# Patient Record
Sex: Male | Born: 1945 | Race: White | Hispanic: No | Marital: Married | Smoking: Never smoker
Health system: Southern US, Community
[De-identification: ages and names within clinical notes are randomized; demographics above are authoritative.]

## PROBLEM LIST (undated history)

## (undated) DIAGNOSIS — N183 Chronic kidney disease, stage 3 unspecified: Secondary | ICD-10-CM

## (undated) DIAGNOSIS — K922 Gastrointestinal hemorrhage, unspecified: Secondary | ICD-10-CM

## (undated) DIAGNOSIS — G609 Hereditary and idiopathic neuropathy, unspecified: Secondary | ICD-10-CM

## (undated) DIAGNOSIS — I442 Atrioventricular block, complete: Secondary | ICD-10-CM

## (undated) DIAGNOSIS — E11610 Type 2 diabetes mellitus with diabetic neuropathic arthropathy: Secondary | ICD-10-CM

## (undated) DIAGNOSIS — E7800 Pure hypercholesterolemia, unspecified: Secondary | ICD-10-CM

## (undated) DIAGNOSIS — E11359 Type 2 diabetes mellitus with proliferative diabetic retinopathy without macular edema: Secondary | ICD-10-CM

## (undated) DIAGNOSIS — I10 Essential (primary) hypertension: Secondary | ICD-10-CM

## (undated) DIAGNOSIS — I639 Cerebral infarction, unspecified: Secondary | ICD-10-CM

## (undated) DIAGNOSIS — I2510 Atherosclerotic heart disease of native coronary artery without angina pectoris: Secondary | ICD-10-CM

## (undated) DIAGNOSIS — Z9289 Personal history of other medical treatment: Secondary | ICD-10-CM

## (undated) DIAGNOSIS — K449 Diaphragmatic hernia without obstruction or gangrene: Secondary | ICD-10-CM

## (undated) DIAGNOSIS — E109 Type 1 diabetes mellitus without complications: Secondary | ICD-10-CM

## (undated) DIAGNOSIS — I5022 Chronic systolic (congestive) heart failure: Secondary | ICD-10-CM

## (undated) DIAGNOSIS — Z950 Presence of cardiac pacemaker: Secondary | ICD-10-CM

## (undated) DIAGNOSIS — M5412 Radiculopathy, cervical region: Secondary | ICD-10-CM

## (undated) DIAGNOSIS — H548 Legal blindness, as defined in USA: Secondary | ICD-10-CM

## (undated) DIAGNOSIS — K269 Duodenal ulcer, unspecified as acute or chronic, without hemorrhage or perforation: Secondary | ICD-10-CM

## (undated) DIAGNOSIS — M1990 Unspecified osteoarthritis, unspecified site: Secondary | ICD-10-CM

## (undated) DIAGNOSIS — E669 Obesity, unspecified: Secondary | ICD-10-CM

## (undated) DIAGNOSIS — I739 Peripheral vascular disease, unspecified: Secondary | ICD-10-CM

## (undated) DIAGNOSIS — R55 Syncope and collapse: Secondary | ICD-10-CM

## (undated) DIAGNOSIS — E291 Testicular hypofunction: Secondary | ICD-10-CM

## (undated) DIAGNOSIS — N529 Male erectile dysfunction, unspecified: Secondary | ICD-10-CM

## (undated) DIAGNOSIS — I872 Venous insufficiency (chronic) (peripheral): Secondary | ICD-10-CM

## (undated) DIAGNOSIS — J449 Chronic obstructive pulmonary disease, unspecified: Secondary | ICD-10-CM

## (undated) HISTORY — DX: Radiculopathy, cervical region: M54.12

## (undated) HISTORY — DX: Unspecified osteoarthritis, unspecified site: M19.90

## (undated) HISTORY — DX: Hereditary and idiopathic neuropathy, unspecified: G60.9

## (undated) HISTORY — PX: HEEL SPUR SURGERY: SHX665

## (undated) HISTORY — DX: Atherosclerotic heart disease of native coronary artery without angina pectoris: I25.10

## (undated) HISTORY — DX: Chronic systolic (congestive) heart failure: I50.22

## (undated) HISTORY — DX: Type 1 diabetes mellitus without complications: E10.9

## (undated) HISTORY — PX: FRACTURE SURGERY: SHX138

## (undated) HISTORY — PX: TONSILLECTOMY AND ADENOIDECTOMY: SUR1326

## (undated) HISTORY — DX: Diaphragmatic hernia without obstruction or gangrene: K44.9

## (undated) HISTORY — DX: Type 2 diabetes mellitus with proliferative diabetic retinopathy without macular edema: E11.359

## (undated) HISTORY — DX: Peripheral vascular disease, unspecified: I73.9

## (undated) HISTORY — DX: Essential (primary) hypertension: I10

## (undated) HISTORY — DX: Male erectile dysfunction, unspecified: N52.9

## (undated) HISTORY — PX: CATARACT EXTRACTION W/ INTRAOCULAR LENS  IMPLANT, BILATERAL: SHX1307

## (undated) HISTORY — DX: Pure hypercholesterolemia, unspecified: E78.00

## (undated) HISTORY — DX: Testicular hypofunction: E29.1

## (undated) HISTORY — DX: Duodenal ulcer, unspecified as acute or chronic, without hemorrhage or perforation: K26.9

## (undated) HISTORY — DX: Gastrointestinal hemorrhage, unspecified: K92.2

## (undated) HISTORY — PX: CARPAL TUNNEL RELEASE: SHX101

## (undated) HISTORY — PX: ANKLE FRACTURE SURGERY: SHX122

## (undated) HISTORY — DX: Type 2 diabetes mellitus with diabetic neuropathic arthropathy: E11.610

---

## 1974-08-07 HISTORY — PX: WRIST FUSION: SHX839

## 1996-08-07 HISTORY — PX: CORONARY ARTERY BYPASS GRAFT: SHX141

## 1999-04-08 HISTORY — PX: RETINOPATHY SURGERY: SHX765

## 2000-02-07 ENCOUNTER — Encounter: Admission: RE | Admit: 2000-02-07 | Discharge: 2000-05-07 | Payer: Self-pay | Admitting: Internal Medicine

## 2000-03-14 ENCOUNTER — Encounter: Admission: RE | Admit: 2000-03-14 | Discharge: 2000-06-12 | Payer: Self-pay | Admitting: Internal Medicine

## 2001-03-11 HISTORY — PX: SIGMOIDOSCOPY: SUR1295

## 2004-01-17 ENCOUNTER — Emergency Department (HOSPITAL_COMMUNITY): Admission: EM | Admit: 2004-01-17 | Discharge: 2004-01-17 | Payer: Self-pay

## 2004-01-28 ENCOUNTER — Ambulatory Visit (HOSPITAL_COMMUNITY): Admission: RE | Admit: 2004-01-28 | Discharge: 2004-01-28 | Payer: Self-pay | Admitting: Internal Medicine

## 2004-01-30 HISTORY — PX: OTHER SURGICAL HISTORY: SHX169

## 2004-03-01 ENCOUNTER — Encounter (HOSPITAL_BASED_OUTPATIENT_CLINIC_OR_DEPARTMENT_OTHER): Admission: RE | Admit: 2004-03-01 | Discharge: 2004-05-30 | Payer: Self-pay | Admitting: Internal Medicine

## 2004-06-03 ENCOUNTER — Ambulatory Visit: Payer: Self-pay | Admitting: Internal Medicine

## 2004-06-14 ENCOUNTER — Ambulatory Visit: Payer: Self-pay | Admitting: Endocrinology

## 2004-07-12 ENCOUNTER — Ambulatory Visit: Payer: Self-pay | Admitting: Cardiology

## 2004-07-29 ENCOUNTER — Encounter (HOSPITAL_BASED_OUTPATIENT_CLINIC_OR_DEPARTMENT_OTHER): Admission: RE | Admit: 2004-07-29 | Discharge: 2004-09-21 | Payer: Self-pay | Admitting: Internal Medicine

## 2004-08-02 ENCOUNTER — Ambulatory Visit: Payer: Self-pay

## 2004-08-03 ENCOUNTER — Ambulatory Visit: Payer: Self-pay | Admitting: Endocrinology

## 2004-08-04 ENCOUNTER — Encounter: Admission: RE | Admit: 2004-08-04 | Discharge: 2004-08-04 | Payer: Self-pay | Admitting: Specialist

## 2004-08-09 ENCOUNTER — Encounter: Admission: RE | Admit: 2004-08-09 | Discharge: 2004-11-07 | Payer: Self-pay | Admitting: Endocrinology

## 2004-08-22 ENCOUNTER — Encounter: Admission: RE | Admit: 2004-08-22 | Discharge: 2004-08-22 | Payer: Self-pay | Admitting: Specialist

## 2004-08-31 ENCOUNTER — Ambulatory Visit: Payer: Self-pay | Admitting: Endocrinology

## 2004-10-03 ENCOUNTER — Ambulatory Visit: Payer: Self-pay | Admitting: Endocrinology

## 2004-12-06 ENCOUNTER — Ambulatory Visit: Payer: Self-pay | Admitting: Internal Medicine

## 2004-12-29 ENCOUNTER — Ambulatory Visit: Payer: Self-pay | Admitting: Endocrinology

## 2005-01-26 ENCOUNTER — Ambulatory Visit: Payer: Self-pay | Admitting: Endocrinology

## 2005-02-20 ENCOUNTER — Encounter: Admission: RE | Admit: 2005-02-20 | Discharge: 2005-02-20 | Payer: Self-pay | Admitting: Internal Medicine

## 2005-03-30 ENCOUNTER — Ambulatory Visit: Payer: Self-pay | Admitting: Endocrinology

## 2005-06-06 ENCOUNTER — Ambulatory Visit: Payer: Self-pay | Admitting: Endocrinology

## 2005-06-23 ENCOUNTER — Ambulatory Visit: Payer: Self-pay | Admitting: Endocrinology

## 2005-06-30 ENCOUNTER — Ambulatory Visit: Payer: Self-pay | Admitting: Endocrinology

## 2005-09-28 ENCOUNTER — Ambulatory Visit: Payer: Self-pay | Admitting: Endocrinology

## 2005-10-10 ENCOUNTER — Ambulatory Visit: Payer: Self-pay | Admitting: Endocrinology

## 2005-10-11 ENCOUNTER — Encounter (HOSPITAL_BASED_OUTPATIENT_CLINIC_OR_DEPARTMENT_OTHER): Admission: RE | Admit: 2005-10-11 | Discharge: 2006-01-09 | Payer: Self-pay | Admitting: Surgery

## 2005-10-14 ENCOUNTER — Ambulatory Visit (HOSPITAL_COMMUNITY): Admission: RE | Admit: 2005-10-14 | Discharge: 2005-10-14 | Payer: Self-pay | Admitting: Surgery

## 2005-11-18 ENCOUNTER — Ambulatory Visit (HOSPITAL_COMMUNITY): Admission: RE | Admit: 2005-11-18 | Discharge: 2005-11-18 | Payer: Self-pay | Admitting: Orthopedic Surgery

## 2006-05-16 ENCOUNTER — Ambulatory Visit: Payer: Self-pay | Admitting: Endocrinology

## 2006-05-16 LAB — CONVERTED CEMR LAB
Chol/HDL Ratio, serum: 6.3
Cholesterol: 188 mg/dL (ref 0–200)
HDL: 29.9 mg/dL — ABNORMAL LOW (ref >39.0)
LDL DIRECT: 109.9 mg/dL
Triglyceride fasting, serum: 243 mg/dL (ref 0–149)
VLDL: 49 mg/dL — ABNORMAL HIGH (ref 0–40)

## 2006-05-18 ENCOUNTER — Ambulatory Visit: Payer: Self-pay | Admitting: Endocrinology

## 2006-06-21 ENCOUNTER — Ambulatory Visit: Payer: Self-pay | Admitting: Endocrinology

## 2006-06-21 LAB — CONVERTED CEMR LAB
Chol/HDL Ratio, serum: 4.5
Cholesterol: 156 mg/dL (ref 0–200)
HDL: 34.3 mg/dL — ABNORMAL LOW (ref >39.0)
LDL Cholesterol: 86 mg/dL (ref 0–99)
Triglyceride fasting, serum: 180 mg/dL — ABNORMAL HIGH (ref 0–149)
VLDL: 36 mg/dL (ref 0–40)

## 2006-06-29 ENCOUNTER — Ambulatory Visit: Payer: Self-pay | Admitting: Endocrinology

## 2006-08-07 HISTORY — PX: EYE SURGERY: SHX253

## 2006-08-27 ENCOUNTER — Encounter: Payer: Self-pay | Admitting: Endocrinology

## 2006-11-26 ENCOUNTER — Ambulatory Visit: Payer: Self-pay | Admitting: Endocrinology

## 2006-12-12 ENCOUNTER — Encounter (HOSPITAL_BASED_OUTPATIENT_CLINIC_OR_DEPARTMENT_OTHER): Admission: RE | Admit: 2006-12-12 | Discharge: 2007-02-15 | Payer: Self-pay | Admitting: Surgery

## 2006-12-14 ENCOUNTER — Ambulatory Visit (HOSPITAL_COMMUNITY): Admission: RE | Admit: 2006-12-14 | Discharge: 2006-12-14 | Payer: Self-pay | Admitting: Surgery

## 2007-03-11 ENCOUNTER — Encounter: Payer: Self-pay | Admitting: Endocrinology

## 2007-03-11 DIAGNOSIS — I10 Essential (primary) hypertension: Secondary | ICD-10-CM

## 2007-03-11 DIAGNOSIS — I25810 Atherosclerosis of coronary artery bypass graft(s) without angina pectoris: Secondary | ICD-10-CM | POA: Insufficient documentation

## 2007-05-17 ENCOUNTER — Ambulatory Visit: Payer: Self-pay | Admitting: Endocrinology

## 2007-07-03 ENCOUNTER — Encounter: Payer: Self-pay | Admitting: Internal Medicine

## 2007-07-30 ENCOUNTER — Ambulatory Visit: Payer: Self-pay | Admitting: Endocrinology

## 2007-07-30 ENCOUNTER — Encounter: Payer: Self-pay | Admitting: Endocrinology

## 2007-07-30 DIAGNOSIS — L97909 Non-pressure chronic ulcer of unspecified part of unspecified lower leg with unspecified severity: Secondary | ICD-10-CM | POA: Insufficient documentation

## 2007-08-02 ENCOUNTER — Encounter: Payer: Self-pay | Admitting: Endocrinology

## 2007-09-02 ENCOUNTER — Telehealth: Payer: Self-pay | Admitting: Endocrinology

## 2007-09-15 ENCOUNTER — Emergency Department (HOSPITAL_COMMUNITY): Admission: EM | Admit: 2007-09-15 | Discharge: 2007-09-15 | Payer: Self-pay | Admitting: Emergency Medicine

## 2007-09-16 ENCOUNTER — Telehealth: Payer: Self-pay | Admitting: Endocrinology

## 2007-09-18 ENCOUNTER — Encounter (HOSPITAL_BASED_OUTPATIENT_CLINIC_OR_DEPARTMENT_OTHER): Admission: RE | Admit: 2007-09-18 | Discharge: 2007-10-17 | Payer: Self-pay | Admitting: Surgery

## 2007-09-23 ENCOUNTER — Encounter: Payer: Self-pay | Admitting: Endocrinology

## 2007-10-24 ENCOUNTER — Telehealth: Payer: Self-pay | Admitting: Endocrinology

## 2007-10-24 DIAGNOSIS — M1460 Charcot's joint, unspecified site: Secondary | ICD-10-CM | POA: Insufficient documentation

## 2008-05-21 ENCOUNTER — Ambulatory Visit: Payer: Self-pay | Admitting: Endocrinology

## 2008-05-21 DIAGNOSIS — H548 Legal blindness, as defined in USA: Secondary | ICD-10-CM

## 2008-05-21 LAB — CONVERTED CEMR LAB: Blood Glucose, Fingerstick: 70

## 2008-08-07 HISTORY — PX: ANTERIOR FUSION CERVICAL SPINE: SUR626

## 2008-09-10 ENCOUNTER — Telehealth: Payer: Self-pay | Admitting: Endocrinology

## 2008-09-21 ENCOUNTER — Telehealth (INDEPENDENT_AMBULATORY_CARE_PROVIDER_SITE_OTHER): Payer: Self-pay | Admitting: *Deleted

## 2008-09-23 ENCOUNTER — Ambulatory Visit: Payer: Self-pay | Admitting: Internal Medicine

## 2008-09-23 DIAGNOSIS — I252 Old myocardial infarction: Secondary | ICD-10-CM

## 2008-09-23 DIAGNOSIS — Z8679 Personal history of other diseases of the circulatory system: Secondary | ICD-10-CM | POA: Insufficient documentation

## 2008-09-23 DIAGNOSIS — I5022 Chronic systolic (congestive) heart failure: Secondary | ICD-10-CM

## 2008-09-23 DIAGNOSIS — J309 Allergic rhinitis, unspecified: Secondary | ICD-10-CM | POA: Insufficient documentation

## 2008-09-23 DIAGNOSIS — E291 Testicular hypofunction: Secondary | ICD-10-CM

## 2008-09-23 DIAGNOSIS — G629 Polyneuropathy, unspecified: Secondary | ICD-10-CM

## 2008-09-23 DIAGNOSIS — F329 Major depressive disorder, single episode, unspecified: Secondary | ICD-10-CM

## 2008-09-23 DIAGNOSIS — D509 Iron deficiency anemia, unspecified: Secondary | ICD-10-CM

## 2008-09-23 DIAGNOSIS — M5412 Radiculopathy, cervical region: Secondary | ICD-10-CM | POA: Insufficient documentation

## 2008-09-25 ENCOUNTER — Telehealth (INDEPENDENT_AMBULATORY_CARE_PROVIDER_SITE_OTHER): Payer: Self-pay | Admitting: *Deleted

## 2008-09-28 ENCOUNTER — Ambulatory Visit: Payer: Self-pay | Admitting: Endocrinology

## 2008-09-28 DIAGNOSIS — E7800 Pure hypercholesterolemia, unspecified: Secondary | ICD-10-CM

## 2008-09-28 LAB — CONVERTED CEMR LAB
Direct LDL: 118.5 mg/dL
Hgb A1c MFr Bld: 7.6 % — ABNORMAL HIGH (ref 4.6–6.0)
Triglycerides: 227 mg/dL (ref 0–149)

## 2008-10-12 ENCOUNTER — Encounter (HOSPITAL_BASED_OUTPATIENT_CLINIC_OR_DEPARTMENT_OTHER): Admission: RE | Admit: 2008-10-12 | Discharge: 2009-01-10 | Payer: Self-pay | Admitting: General Surgery

## 2008-10-14 ENCOUNTER — Encounter: Payer: Self-pay | Admitting: Endocrinology

## 2008-10-19 ENCOUNTER — Encounter: Payer: Self-pay | Admitting: Internal Medicine

## 2008-10-19 ENCOUNTER — Emergency Department (HOSPITAL_COMMUNITY): Admission: EM | Admit: 2008-10-19 | Discharge: 2008-10-19 | Payer: Self-pay | Admitting: *Deleted

## 2008-10-19 ENCOUNTER — Encounter: Payer: Self-pay | Admitting: Endocrinology

## 2008-10-20 ENCOUNTER — Encounter: Admission: RE | Admit: 2008-10-20 | Discharge: 2008-10-20 | Payer: Self-pay | Admitting: Neurology

## 2008-10-30 ENCOUNTER — Inpatient Hospital Stay (HOSPITAL_COMMUNITY): Admission: RE | Admit: 2008-10-30 | Discharge: 2008-10-31 | Payer: Self-pay | Admitting: Neurological Surgery

## 2008-11-19 ENCOUNTER — Encounter: Admission: RE | Admit: 2008-11-19 | Discharge: 2008-11-19 | Payer: Self-pay | Admitting: Neurological Surgery

## 2009-01-25 ENCOUNTER — Encounter: Admission: RE | Admit: 2009-01-25 | Discharge: 2009-01-25 | Payer: Self-pay | Admitting: Neurological Surgery

## 2009-02-10 ENCOUNTER — Encounter: Admission: RE | Admit: 2009-02-10 | Discharge: 2009-02-10 | Payer: Self-pay | Admitting: Neurological Surgery

## 2009-02-17 ENCOUNTER — Telehealth: Payer: Self-pay | Admitting: Endocrinology

## 2009-02-26 ENCOUNTER — Telehealth: Payer: Self-pay | Admitting: Endocrinology

## 2009-03-01 ENCOUNTER — Telehealth (INDEPENDENT_AMBULATORY_CARE_PROVIDER_SITE_OTHER): Payer: Self-pay | Admitting: *Deleted

## 2009-03-09 ENCOUNTER — Ambulatory Visit: Payer: Self-pay | Admitting: Endocrinology

## 2009-03-09 DIAGNOSIS — E113599 Type 2 diabetes mellitus with proliferative diabetic retinopathy without macular edema, unspecified eye: Secondary | ICD-10-CM | POA: Insufficient documentation

## 2009-03-09 DIAGNOSIS — E11359 Type 2 diabetes mellitus with proliferative diabetic retinopathy without macular edema: Secondary | ICD-10-CM | POA: Insufficient documentation

## 2009-03-09 DIAGNOSIS — IMO0001 Reserved for inherently not codable concepts without codable children: Secondary | ICD-10-CM

## 2009-03-09 LAB — CONVERTED CEMR LAB: Sed Rate: 45 mm/hr — ABNORMAL HIGH (ref 0–22)

## 2009-04-08 ENCOUNTER — Telehealth: Payer: Self-pay | Admitting: Endocrinology

## 2009-04-13 ENCOUNTER — Encounter: Admission: RE | Admit: 2009-04-13 | Discharge: 2009-04-13 | Payer: Self-pay | Admitting: Neurological Surgery

## 2009-04-26 ENCOUNTER — Encounter (HOSPITAL_BASED_OUTPATIENT_CLINIC_OR_DEPARTMENT_OTHER): Admission: RE | Admit: 2009-04-26 | Discharge: 2009-05-06 | Payer: Self-pay | Admitting: General Surgery

## 2009-04-26 ENCOUNTER — Ambulatory Visit: Payer: Self-pay | Admitting: Endocrinology

## 2009-04-26 DIAGNOSIS — R609 Edema, unspecified: Secondary | ICD-10-CM | POA: Insufficient documentation

## 2009-04-28 LAB — CONVERTED CEMR LAB
BUN: 25 mg/dL — ABNORMAL HIGH (ref 6–23)
Bilirubin Urine: NEGATIVE
Bilirubin, Direct: 0 mg/dL (ref 0.0–0.3)
CO2: 30 meq/L (ref 19–32)
Chloride: 104 meq/L (ref 96–112)
Cholesterol: 146 mg/dL (ref 0–200)
Creatinine, Ser: 1.4 mg/dL (ref 0.4–1.5)
Direct LDL: 87.2 mg/dL
Eosinophils Absolute: 0.3 10*3/uL (ref 0.0–0.7)
Hgb A1c MFr Bld: 7.1 % — ABNORMAL HIGH (ref 4.6–6.5)
Ketones, ur: NEGATIVE mg/dL
Leukocytes, UA: NEGATIVE
MCHC: 33.4 g/dL (ref 30.0–36.0)
MCV: 88.3 fL (ref 78.0–100.0)
Microalb Creat Ratio: 7.3 mg/g (ref 0.0–30.0)
Microalb, Ur: 0.6 mg/dL (ref 0.0–1.9)
Monocytes Absolute: 0.8 10*3/uL (ref 0.1–1.0)
Neutrophils Relative %: 72.6 % (ref 43.0–77.0)
PSA: 0.2 ng/mL (ref 0.10–4.00)
Platelets: 220 10*3/uL (ref 150.0–400.0)
Pro B Natriuretic peptide (BNP): 43 pg/mL (ref 0.0–100.0)
RDW: 13.9 % (ref 11.5–14.6)
Saturation Ratios: 9.8 % — ABNORMAL LOW (ref 20.0–50.0)
Total Bilirubin: 0.6 mg/dL (ref 0.3–1.2)
Urine Glucose: NEGATIVE mg/dL
VLDL: 49.2 mg/dL — ABNORMAL HIGH (ref 0.0–40.0)
pH: 5.5 (ref 5.0–8.0)

## 2009-04-29 ENCOUNTER — Telehealth: Payer: Self-pay | Admitting: Endocrinology

## 2009-05-17 ENCOUNTER — Telehealth: Payer: Self-pay | Admitting: Endocrinology

## 2009-07-13 ENCOUNTER — Encounter: Admission: RE | Admit: 2009-07-13 | Discharge: 2009-07-13 | Payer: Self-pay | Admitting: Neurological Surgery

## 2009-07-27 ENCOUNTER — Encounter (HOSPITAL_BASED_OUTPATIENT_CLINIC_OR_DEPARTMENT_OTHER): Admission: RE | Admit: 2009-07-27 | Discharge: 2009-08-09 | Payer: Self-pay | Admitting: Internal Medicine

## 2009-08-02 ENCOUNTER — Ambulatory Visit: Payer: Self-pay | Admitting: Endocrinology

## 2009-08-11 ENCOUNTER — Encounter (HOSPITAL_BASED_OUTPATIENT_CLINIC_OR_DEPARTMENT_OTHER): Admission: RE | Admit: 2009-08-11 | Discharge: 2009-09-14 | Payer: Self-pay | Admitting: Internal Medicine

## 2009-11-18 ENCOUNTER — Encounter: Payer: Self-pay | Admitting: Internal Medicine

## 2009-11-30 ENCOUNTER — Ambulatory Visit: Payer: Self-pay | Admitting: Endocrinology

## 2009-11-30 LAB — CONVERTED CEMR LAB
Calcium: 9 mg/dL (ref 8.4–10.5)
Chloride: 105 meq/L (ref 96–112)
Creatinine, Ser: 1.6 mg/dL — ABNORMAL HIGH (ref 0.4–1.5)
GFR calc non Af Amer: 46.16 mL/min (ref >60)
Hgb A1c MFr Bld: 7.1 % — ABNORMAL HIGH (ref 4.6–6.5)
Pro B Natriuretic peptide (BNP): 40 pg/mL (ref 0.0–100.0)

## 2009-12-05 ENCOUNTER — Inpatient Hospital Stay (HOSPITAL_COMMUNITY): Admission: EM | Admit: 2009-12-05 | Discharge: 2009-12-09 | Payer: Self-pay | Admitting: Emergency Medicine

## 2009-12-06 ENCOUNTER — Encounter (INDEPENDENT_AMBULATORY_CARE_PROVIDER_SITE_OTHER): Payer: Self-pay | Admitting: Internal Medicine

## 2009-12-07 ENCOUNTER — Ambulatory Visit: Payer: Self-pay | Admitting: Vascular Surgery

## 2009-12-28 ENCOUNTER — Encounter (HOSPITAL_BASED_OUTPATIENT_CLINIC_OR_DEPARTMENT_OTHER): Admission: RE | Admit: 2009-12-28 | Discharge: 2010-02-04 | Payer: Self-pay | Admitting: General Surgery

## 2010-01-13 ENCOUNTER — Encounter: Payer: Self-pay | Admitting: Endocrinology

## 2010-03-09 ENCOUNTER — Telehealth: Payer: Self-pay | Admitting: Endocrinology

## 2010-03-09 ENCOUNTER — Encounter: Payer: Self-pay | Admitting: Endocrinology

## 2010-03-15 ENCOUNTER — Ambulatory Visit: Payer: Self-pay | Admitting: Endocrinology

## 2010-03-15 DIAGNOSIS — N259 Disorder resulting from impaired renal tubular function, unspecified: Secondary | ICD-10-CM | POA: Insufficient documentation

## 2010-03-15 LAB — CONVERTED CEMR LAB
CO2: 29 meq/L (ref 19–32)
Calcium: 9.1 mg/dL (ref 8.4–10.5)
GFR calc non Af Amer: 52.93 mL/min (ref >60)
Hgb A1c MFr Bld: 7.5 % — ABNORMAL HIGH (ref 4.6–6.5)
Pro B Natriuretic peptide (BNP): 59.5 pg/mL (ref 0.0–100.0)
Sodium: 140 meq/L (ref 135–145)

## 2010-05-24 ENCOUNTER — Encounter (HOSPITAL_BASED_OUTPATIENT_CLINIC_OR_DEPARTMENT_OTHER)
Admission: RE | Admit: 2010-05-24 | Discharge: 2010-07-28 | Payer: Self-pay | Source: Home / Self Care | Attending: General Surgery | Admitting: General Surgery

## 2010-05-30 ENCOUNTER — Encounter: Payer: Self-pay | Admitting: Endocrinology

## 2010-07-19 ENCOUNTER — Ambulatory Visit: Payer: Self-pay | Admitting: Endocrinology

## 2010-07-19 DIAGNOSIS — M79609 Pain in unspecified limb: Secondary | ICD-10-CM

## 2010-07-19 LAB — CONVERTED CEMR LAB
AST: 23 units/L (ref 0–37)
Alkaline Phosphatase: 65 units/L (ref 39–117)
Basophils Absolute: 0 10*3/uL (ref 0.0–0.1)
Bilirubin, Direct: 0.1 mg/dL (ref 0.0–0.3)
CO2: 30 meq/L (ref 19–32)
Calcium: 9.4 mg/dL (ref 8.4–10.5)
Cholesterol: 235 mg/dL — ABNORMAL HIGH (ref 0–200)
Creatinine, Ser: 1.7 mg/dL — ABNORMAL HIGH (ref 0.4–1.5)
Creatinine,U: 52.6 mg/dL
Eosinophils Absolute: 0.3 10*3/uL (ref 0.0–0.7)
GFR calc non Af Amer: 43.55 mL/min — ABNORMAL LOW (ref >60.00)
HDL: 40.4 mg/dL (ref >39.00)
Leukocytes, UA: NEGATIVE
Lymphocytes Relative: 20.8 % (ref 12.0–46.0)
MCHC: 34.3 g/dL (ref 30.0–36.0)
Microalb Creat Ratio: 1.1 mg/g (ref 0.0–30.0)
Microalb, Ur: 0.6 mg/dL (ref 0.0–1.9)
Monocytes Relative: 10.4 % (ref 3.0–12.0)
Neutrophils Relative %: 64.6 % (ref 43.0–77.0)
Nitrite: NEGATIVE
PSA: 0.25 ng/mL (ref 0.10–4.00)
RBC: 4.78 M/uL (ref 4.22–5.81)
RDW: 13.9 % (ref 11.5–14.6)
Specific Gravity, Urine: <=1.005 (ref 1.000–1.030)
Total CHOL/HDL Ratio: 6
Total Protein, Urine: NEGATIVE mg/dL
Transferrin: 291 mg/dL (ref 212.0–360.0)
VLDL: 40.8 mg/dL — ABNORMAL HIGH (ref 0.0–40.0)
pH: 6 (ref 5.0–8.0)

## 2010-08-07 DIAGNOSIS — Z9289 Personal history of other medical treatment: Secondary | ICD-10-CM

## 2010-08-07 DIAGNOSIS — K922 Gastrointestinal hemorrhage, unspecified: Secondary | ICD-10-CM

## 2010-08-07 HISTORY — DX: Gastrointestinal hemorrhage, unspecified: K92.2

## 2010-08-07 HISTORY — DX: Personal history of other medical treatment: Z92.89

## 2010-08-09 ENCOUNTER — Encounter: Payer: Self-pay | Admitting: Endocrinology

## 2010-08-12 ENCOUNTER — Ambulatory Visit: Admission: RE | Admit: 2010-08-12 | Discharge: 2010-08-12 | Payer: Self-pay | Source: Home / Self Care

## 2010-08-12 ENCOUNTER — Encounter: Payer: Self-pay | Admitting: Endocrinology

## 2010-08-15 ENCOUNTER — Telehealth: Payer: Self-pay | Admitting: Endocrinology

## 2010-08-16 ENCOUNTER — Ambulatory Visit
Admission: RE | Admit: 2010-08-16 | Discharge: 2010-08-16 | Payer: Self-pay | Source: Home / Self Care | Attending: Endocrinology | Admitting: Endocrinology

## 2010-08-16 DIAGNOSIS — E875 Hyperkalemia: Secondary | ICD-10-CM | POA: Insufficient documentation

## 2010-08-17 DIAGNOSIS — I739 Peripheral vascular disease, unspecified: Secondary | ICD-10-CM | POA: Insufficient documentation

## 2010-08-17 HISTORY — DX: Peripheral vascular disease, unspecified: I73.9

## 2010-08-28 ENCOUNTER — Encounter: Payer: Self-pay | Admitting: Physical Medicine and Rehabilitation

## 2010-08-30 ENCOUNTER — Encounter: Payer: Self-pay | Admitting: Cardiovascular Disease

## 2010-08-30 ENCOUNTER — Ambulatory Visit
Admission: RE | Admit: 2010-08-30 | Discharge: 2010-08-30 | Payer: Self-pay | Source: Home / Self Care | Attending: Cardiovascular Disease | Admitting: Cardiovascular Disease

## 2010-08-30 DIAGNOSIS — R0602 Shortness of breath: Secondary | ICD-10-CM | POA: Insufficient documentation

## 2010-09-08 NOTE — Assessment & Plan Note (Signed)
Summary: rx consult-lb   Vital Signs:  Patient profile:   65 year old male Height:      65 inches (165.10 cm) Weight:      269.25 pounds (122.39 kg) BMI:     44.97 O2 Sat:      97 % on Room air Temp:     97.8 degrees F (36.56 degrees C) oral Pulse rate:   77 / minute Pulse rhythm:   regular BP sitting:   128 / 58  (left arm) Cuff size:   large  Vitals Entered By: Brenton Grills CMA Duncan Dull) (August 16, 2010 3:51 PM)  O2 Flow:  Room air CC: Discuss Benicar/pt is not currently taking Red Yeast Rice or Colestipol/aj Is Patient Diabetic? Yes   CC:  Discuss Benicar/pt is not currently taking Red Yeast Rice or Colestipol/aj.  History of Present Illness: no cbg record, but states cbg's are often over 200 at hs, which is the highest point of the day.   pt states few years of severe pain at the legs and feet, in the context of reclining at night, and assoc numbness.  he says that 60 vicodin last only 20 days.    Current Medications (verified): 1)  Lasix 40 Mg  Tabs (Furosemide) .... Take 2 Po Once Daily 2)  Adult Aspirin Low Strength 81 Mg  Tbdp (Aspirin) .... Take 1 By Mouth Qd 3)  Red Yeast Rice 600 Mg  Caps (Red Yeast Rice Extract) .... Take 1 Two Times A Day Qd 4)  Humulin R 100 Unit/ml Inj Soln (Insulin Regular Human) .... 31 Units Three Times A Day (Just Before Each Meal) 5)  Humulin N 100 Unit/ml  Susp (Insulin Isophane Human) .... 25 Units Qhs 6)  Quad Cane 713.5 7)  Colestipol Hcl 1 Gm Tabs (Colestipol Hcl) .... 5 Qd 8)  Clotrimazole-Betamethasone 1-0.05 % Crea (Clotrimazole-Betamethasone) .... Three Times A Day As Needed Rash 9)  Insulin Syringe 31g X 5/16" 0.3 Ml Misc (Insulin Syringe-Needle U-100) .... Use As Directed 10)  Hydrocodone-Acetaminophen 10-325 Mg Tabs (Hydrocodone-Acetaminophen) .... 1/2-1 Every 4 As Needed For Pain. 11)  Benicar 20 Mg Tabs (Olmesartan Medoxomil) .[name].. 1 Tab Once Daily  Allergies (verified): 1)  * Viagra  Past History:  Past Medical  History: Last updated: 09/23/2008 Coronary artery disease Diabetes mellitus, type I Hypertension DM Retinopathy/Blind Dyslipidemia left foot charcot deformity/chronic pain LE venous insufficiency Anemia-iron deficiency Cerebrovascular accident, hx of/RUE weakness Depression hypogonadism Peripheral neuropathy Allergic rhinitis non compliance E.D. Congestive heart failure Myocardial infarction, hx of  Social History: Reviewed history from 09/23/2008 and no changes required. Married disabled Alcohol use-no Never Smoked comes to appointments with wife  Review of Systems  The patient denies hypoglycemia and dyspnea on exertion.    Physical Exam  General:  obese.  no distress  Extremities:  3+ right pedal edema and 2+ left pedal edema.     Impression & Recommendations:  Problem # 1:  DIABETES MELLITUS, TYPE I (ICD-250.01) needs increased rx  Problem # 2:  LEG PAIN (ICD-729.5) neuropathic needs increased rx  Problem # 3:  HYPERTENSION (ICD-401.9) 20 mg of benicar is not enough.  Problem # 4:  HYPERKALEMIA (ICD-276.7) Assessment: New exac by arb  Medications Added to Medication List This Visit: 1)  Humulin R 100 Unit/ml Inj Soln (Insulin regular human) .... Three times a day (just before each meal) 31-31-35 units 2)  Carvedilol 3.125 Mg Tabs (Carvedilol) .[name].. 1 tab two times a day 3)  Insulin Syringe 31g  X 5/16" 0.5 Ml Misc (Insulin syringe-needle u-100) .... 4x a day 4)  Gabapentin 100 Mg Caps (Gabapentin) .[name].. 1 tab at bedtime  Other Orders: Est. Patient Level V (04540)  Patient Instructions: 1)  reduce benicar to 20 mg.  this is because you have a tendency to retain potassium. 2)  add carvedilol 3.125 mg two times a day.   3)  increase regular insulin to three times a day 31-31-35 units. 4)  increase hydrocodone-apap to a total of 90 pills per month. 5)  add gabapentin 100 mg at bedtime 6)  Please schedule a follow-up appointment in 1  month. Prescriptions: GABAPENTIN 100 MG CAPS (GABAPENTIN) 1 tab at bedtime  #30 x 11   Entered and Authorized by:   Minus Breeding MD   Signed by:   Minus Breeding MD on 08/16/2010   Method used:   Electronically to        [facility] [phone]* (retail)       [address]       [place], [place]  [address]       Ph: [phone]       Fax: [phone]   RxID:   [id number] HYDROCODONE-ACETAMINOPHEN 10-325 MG TABS (HYDROCODONE-ACETAMINOPHEN) 1/2-1 every 4 as needed for pain.  #90 x 5   Entered and Authorized by:   Minus Breeding MD   Signed by:   Minus Breeding MD on 08/16/2010   Method used:   Print then Give to Patient   RxID:   [id number] INSULIN SYRINGE 31G X 5/16" 0.5 ML MISC (INSULIN SYRINGE-NEEDLE U-100) 4x a day  #120 x 11   Entered and Authorized by:   Minus Breeding MD   Signed by:   Minus Breeding MD on 08/16/2010   Method used:   Electronically to        [facility]. [phone]* (retail)       [address]       [place], [place]  [address]       Ph: [phone]       Fax: [phone]   RxID:   [id number] CARVEDILOL 3.125 MG TABS (CARVEDILOL) 1 tab two times a day  #60 x 11   Entered and Authorized by:   Minus Breeding MD   Signed by:   Minus Breeding MD on 08/16/2010   Method used:   Electronically to        [facility]. [phone]* (retail)       [address]       [place], [place]  [address]       Ph: [phone]       Fax: [phone]   RxID:   [id number]    Orders Added: 1)  Est. Patient Level V [78938]

## 2010-09-08 NOTE — Assessment & Plan Note (Signed)
Summary: f/u appt / # / cd   Vital Signs:  Patient profile:   65 year old male Height:      65 inches (165.10 cm) Weight:      258.50 pounds (117.50 kg) BMI:     43.17 O2 Sat:      95 % on Room air Temp:     98.0 degrees F (36.67 degrees C) oral Pulse rate:   72 / minute BP sitting:   118 / 64  (left arm) Cuff size:   regular  Vitals Entered By: Brenton Grills MA (March 15, 2010 3:52 PM)  O2 Flow:  Room air CC: F/U appt/aj Is Patient Diabetic? Yes   CC:  F/U appt/aj.  History of Present Illness: the status of at least 3 ongoing medical problems is addressed today: dm:  no cbg record, but states cbg's are well-controlled in am (100).  it then increases as the day goes on, up to 200.  pt states he feels well in general.   painful neuropathy:  hydrocodone works well.   edema:  no sob  Current Medications (verified): 1)  Benicar 40 Mg  Tabs (Olmesartan Medoxomil) .... Take 1 By Mouth Qd 2)  Lasix 40 Mg  Tabs (Furosemide) .... Take 2 Po Once Daily 3)  Adult Aspirin Low Strength 81 Mg  Tbdp (Aspirin) .... Take 1 By Mouth Qd 4)  Red Yeast Rice 600 Mg  Caps (Red Yeast Rice Extract) .... Take 1 Two Times A Day Qd 5)  Humulin R 100 Unit/ml Inj Soln (Insulin Regular Human) .... Three Times A Day (Qac) 30-25-35 Units 6)  Humulin N 100 Unit/ml  Susp (Insulin Isophane Human) .... 25 Units Qhs 7)  Quad Cane 713.5 8)  Bd Insulin Syringe 31g X 5/16" 0.3 Ml Misc (Insulin Syringe-Needle U-100) .... Use As Directed 9)  Colestipol Hcl 1 Gm Tabs (Colestipol Hcl) .... 5 Qd 10)  Hydrocodone-Acetaminophen 10-325 Mg Tabs (Hydrocodone-Acetaminophen) .[name].. 1 Q4h As Needed Pain 11)  Clotrimazole-Betamethasone 1-0.05 % Crea (Clotrimazole-Betamethasone) .... Three Times A Day As Needed Rash 12)  Insulin Syringe 31g X 5/16" 0.3 Ml Misc (Insulin Syringe-Needle U-100) .... Use As Directed 13)  Hydrocodone-Acetaminophen 10-325 Mg Tabs (Hydrocodone-Acetaminophen) .... 1/2-1 Every 4 Hrn As Needed For  Pain.  Allergies (verified): 1)  * Viagra  Past History:  Past Medical History: Last updated: 09/23/2008 Coronary artery disease Diabetes mellitus, type I Hypertension DM Retinopathy/Blind Dyslipidemia left foot charcot deformity/chronic pain LE venous insufficiency Anemia-iron deficiency Cerebrovascular accident, hx of/RUE weakness Depression hypogonadism Peripheral neuropathy Allergic rhinitis non compliance E.D. Congestive heart failure Myocardial infarction, hx of  Review of Systems       The patient complains of weight gain.  The patient denies syncope.    Physical Exam  General:  obese.  in whelchair.  no distress  Extremities:  2+ right pedal edema and 2+ left pedal edema.   Additional Exam:  B-Type Natriuetic Peptide           59.5 pg/mL                  0.0-100.0 Hemoglobin A1C       [H]  7.5 %     Impression & Recommendations:  Problem # 1:  DIABETES MELLITUS, TYPE I (ICD-250.01) needs increased rx  Problem # 2:  PERIPHERAL NEUROPATHY (ICD-356.9) pain is well-controlled  Problem # 3:  EDEMA (ICD-782.3) normal bnp says it is localized, and does not reprsent generalized fluid overload.  Other  Orders: TLB-BMP (Basic Metabolic Panel-BMET) (80048-METABOL) TLB-BNP (B-Natriuretic Peptide) (83880-BNPR) TLB-A1C / Hgb A1C (Glycohemoglobin) (83036-A1C) Est. Patient Level IV ([address])  Patient Instructions: 1)  blood tests are being ordered for you today.  please call [phone] to hear your test results. 2)  pending the test results, please: 3)  continue regular insulin (just before each meal) 30-25-25 units 4)  same nph (25 units at night) 5)  Please schedule a physical appointment in 3 months. 6)  (update: i left message on phone-tree:  increase regular insulin to (just before each meal) 32-27-27 units).

## 2010-09-08 NOTE — Letter (Signed)
[facility]: [facility]   Imported By: Lester Rampart 03/11/2010 12:37:27  _____________________________________________________________________  External Attachment:    Type:   Image     Comment:   External Document

## 2010-09-08 NOTE — Assessment & Plan Note (Signed)
Summary: F/U APPT/#/CD   Vital Signs:  Patient profile:   65 year old male Height:      65 inches (165.10 cm) Weight:      267.38 pounds (121.54 kg) O2 Sat:      96 % on Room air Temp:     97.4 degrees F (36.33 degrees C) oral Pulse rate:   76 / minute BP sitting:   150 / 80  (left arm) Cuff size:   regular  Vitals Entered By: Josph Macho RMA (November 30, 2009 2:10 PM)  O2 Flow:  Room air CC: Follow-up visit/ Pt is no longer taking lasix, Red Yeast rice, Klor Con, BD insulin syringe is on the list twice, he is not taking Vicodin or Colestipol/ CF Is Patient Diabetic? Yes   CC:  Follow-up visit/ Pt is no longer taking lasix, Red Yeast rice, Klor Con, BD insulin syringe is on the list twice, and he is not taking Vicodin or Colestipol/ CF.  History of Present Illness: pt states few years of moderate pain at the legs, which is worse in the context of sleeping.  he has associated numbness.   no cbg record, but states cbg's are "high" at some times of day.  pt and wife are unsure of any pattern throughout the day.  Current Medications (verified): 1)  Benicar 40 Mg  Tabs (Olmesartan Medoxomil) .... Take 1 By Mouth Qd 2)  Lasix 40 Mg  Tabs (Furosemide) .... Take 2 Po Bid 3)  Adult Aspirin Low Strength 81 Mg  Tbdp (Aspirin) .... Take 1 By Mouth Qd 4)  Red Yeast Rice 600 Mg  Caps (Red Yeast Rice Extract) .... Take 1 Two Times A Day Qd 5)  Klor-Con M20 20 Meq  Tbcr (Potassium Chloride Crys Cr) .... Take 1 By Mouth Once Daily 6)  Humulin R 100 Unit/ml Inj Soln (Insulin Regular Human) .... Three Times A Day (Qac) 30-25-35 Units 7)  Humulin N 100 Unit/ml  Susp (Insulin Isophane Human) .... 25 Units Qhs 8)  Vicodin 5-500 Mg  Tabs (Hydrocodone-Acetaminophen) .[name].. 1 Q4h As Needed Pain 9)  Quad Cane 713.5 10)  Bd Insulin Syringe 31g X 5/16" 0.3 Ml Misc (Insulin Syringe-Needle U-100) .... Use As Directed 11)  Colestipol Hcl 1 Gm Tabs (Colestipol Hcl) .... 5 Qd 12)  Hydrocodone-Acetaminophen  10-325 Mg Tabs (Hydrocodone-Acetaminophen) .[name].. 1 Q4h As Needed Pain 13)  Clotrimazole-Betamethasone 1-0.05 % Crea (Clotrimazole-Betamethasone) .... Three Times A Day As Needed Rash 14)  Insulin Syringe 31g X 5/16" 0.3 Ml Misc (Insulin Syringe-Needle U-100) .... Use As Directed  Allergies (verified): 1)  * Viagra  Past History:  Past Medical History: Last updated: 09/23/2008 Coronary artery disease Diabetes mellitus, type I Hypertension DM Retinopathy/Blind Dyslipidemia left foot charcot deformity/chronic pain LE venous insufficiency Anemia-iron deficiency Cerebrovascular accident, hx of/RUE weakness Depression hypogonadism Peripheral neuropathy Allergic rhinitis non compliance E.D. Congestive heart failure Myocardial infarction, hx of  Past Surgical History: Reviewed history from 03/09/2009 and no changes required. Coronary artery bypass graft Sigmoidoscopy (03/11/2001) Venous doppler (01/28/2004) Carpal tunnel release - left s/p right wrist surgery - bone graft/fusion c-spine surgery 2010 for disc dr Yetta Barre  Review of Systems       The patient complains of weight gain.  The patient denies hypoglycemia and dyspnea on exertion.    Physical Exam  General:  obese.   Extremities:  2+ right pedal edema and 2+ left pedal edema.   Additional Exam:  Potassium  5.0 mEq/L   Creatinine           [H]  1.6 mg/dL                   0.4-5.4   B-Type Natriuetic Peptide        40.0 pg/mL     Impression & Recommendations:  Problem # 1:  DIABETES MELLITUS, TYPE I (ICD-250.01) well-controlled for his situation  Problem # 2:  PERIPHERAL NEUROPATHY (ICD-356.9) with painful component  Problem # 3:  hypokalemia overreplaced  Problem # 4:  EDEMA (ICD-782.3) but he doesn't seem to have generalized fluid overload  Medications Added to Medication List This Visit: 1)  Lasix 40 Mg Tabs (Furosemide) .... Take 2 po once daily 2)  Hydrocodone-acetaminophen 10-325 Mg Tabs  (Hydrocodone-acetaminophen) .... 1/2-1 every 4 hrn as needed for pain.  Other Orders: Diabetic Clinic Referral (Diabetic) TLB-A1C / Hgb A1C (Glycohemoglobin) (83036-A1C) TLB-BMP (Basic Metabolic Panel-BMET) (80048-METABOL) TLB-BNP (B-Natriuretic Peptide) (83880-BNPR) Est. Patient Level IV (09811)  Patient Instructions: 1)  hydrocodone:  1/2 or 1 every 4 hrn as needed for pain. 2)  continue regular insulin (just before each meal) 30-25-25 units 3)  same nph (25 units at night) 4)  refer dietician.  you will be called with a day and time for an appointment 5)  resume the furosemide 2x40 mg two times a day, and potassium.  i have sent prescriptions for you. 6)  Please schedule a follow-up appointment in 3 months. 7)  (update: i left message on phone-tree:  take lasix only 2x40 mg once daily, and do not take klor). Prescriptions: COLESTIPOL HCL 1 GM TABS (COLESTIPOL HCL) 5 qd  #150 x 11   Entered and Authorized by:   Minus Breeding MD   Signed by:   Minus Breeding MD on 11/30/2009   Method used:   Electronically to        [facility]. [phone]* (retail)       [address]       [place], [place]  [address]       Ph: [phone]       Fax: [phone]   RxID:   [id number] KLOR-CON M20 20 MEQ  TBCR (POTASSIUM CHLORIDE CRYS CR) take 1 by mouth once daily  #30 x 11   Entered and Authorized by:   Minus Breeding MD   Signed by:   Minus Breeding MD on 11/30/2009   Method used:   Electronically to        [facility]. [phone]* (retail)       [address]       [place], [place]  [address]       Ph: [phone]       Fax: [phone]   RxID:   [id number] LASIX 40 MG  TABS (FUROSEMIDE) take 2 po bid  #180 Each x 6   Entered and Authorized by:   Minus Breeding MD   Signed by:   Minus Breeding MD on 11/30/2009   Method used:   Electronically to        [facility]. [phone]* (retail)       [address]       [place], [place]  [address]       Ph: [phone]  Fax: [phone]   RxID:   [id number] HYDROCODONE-ACETAMINOPHEN 10-325 MG TABS (HYDROCODONE-ACETAMINOPHEN) 1/2-1 every 4 hrn as needed for pain.  #50 x 3   Entered and Authorized by:   Minus Breeding MD   Signed by:   Minus Breeding MD on 11/30/2009   Method used:   Print then Give to Patient   RxID:   [id number]

## 2010-09-08 NOTE — Progress Notes (Signed)
Summary: Pt ?  Phone Note Call from Patient   Caller: Spouse [phone] Summary of Call: Pt's spouse called that pt was advised to decrease Benicar to 20mg  but has since been experiencing increased HR and "buzzing" in his ears. Spouse states that pt was concerned that thios was doue to decrease in medicine and haas since started to take 2 20mg  tabs. Spouse is requestibng MD advisement and if 40 mg once daily is okay for pt to re-start, spouse is requesting new Rx. Initial call taken by: Margaret Pyle, CMA,  August 15, 2010 11:08 AM  Follow-up for Phone Call        please advise ov Follow-up by: Minus Breeding MD,  August 15, 2010 12:14 PM  Additional Follow-up for Phone Call Additional follow up Details #1::        Spouse advised and transferred to sch appt Additional Follow-up by: Margaret Pyle, CMA,  August 15, 2010 1:46 PM

## 2010-09-08 NOTE — Assessment & Plan Note (Signed)
Summary: FU---D/T---STC   Vital Signs:  Patient profile:   65 year old male Height:      65 inches (165.10 cm) Weight:      261.50 pounds (118.86 kg) BMI:     43.67 O2 Sat:      94 % on Room air Temp:     98.1 degrees F (36.72 degrees C) oral Pulse rate:   73 / minute BP sitting:   122 / 62  (left arm) Cuff size:   regular  Vitals Entered By: Brenton Grills CMA Duncan Dull) (July 19, 2010 4:20 PM)  O2 Flow:  Room air CC: Follow-up visit/aj Is Patient Diabetic? Yes   CC:  Follow-up visit/aj.  History of Present Illness: no cbg record, but states cbg was low only once in the past 3 mos.  he does not recall the time of day.  he says it is well-controlled in general.   pt states 6 mos of worsening of pain at both legs.  he says it can be severe.  there is some assoc numbness. last refill of percocet was 07/12/10, and he says he wants to claim the last remaining refill now, as he is going on vacation.  i have reviewed the [place] controlled substances database with patient, and he has been refilling every 20 days or so.       Current Medications (verified): 1)  Benicar 40 Mg  Tabs (Olmesartan Medoxomil) .... Take 1 By Mouth Qd 2)  Lasix 40 Mg  Tabs (Furosemide) .... Take 2 Po Once Daily 3)  Adult Aspirin Low Strength 81 Mg  Tbdp (Aspirin) .... Take 1 By Mouth Qd 4)  Red Yeast Rice 600 Mg  Caps (Red Yeast Rice Extract) .... Take 1 Two Times A Day Qd 5)  Humulin R 100 Unit/ml Inj Soln (Insulin Regular Human) .... Three Times A Day (Qac) 30-25-35 Units 6)  Humulin N 100 Unit/ml  Susp (Insulin Isophane Human) .... 25 Units Qhs 7)  Quad Cane 713.5 8)  Colestipol Hcl 1 Gm Tabs (Colestipol Hcl) .... 5 Qd 9)  Clotrimazole-Betamethasone 1-0.05 % Crea (Clotrimazole-Betamethasone) .... Three Times A Day As Needed Rash 10)  Insulin Syringe 31g X 5/16" 0.3 Ml Misc (Insulin Syringe-Needle U-100) .... Use As Directed 11)  Hydrocodone-Acetaminophen 10-325 Mg Tabs (Hydrocodone-Acetaminophen) .... 1/2-1  Every 4 Hrn As Needed For Pain.  Allergies (verified): 1)  * Viagra  Past History:  Past Medical History: Last updated: 09/23/2008 Coronary artery disease Diabetes mellitus, type I Hypertension DM Retinopathy/Blind Dyslipidemia left foot charcot deformity/chronic pain LE venous insufficiency Anemia-iron deficiency Cerebrovascular accident, hx of/RUE weakness Depression hypogonadism Peripheral neuropathy Allergic rhinitis non compliance E.D. Congestive heart failure Myocardial infarction, hx of  Review of Systems  The patient denies weight loss and weight gain.    Physical Exam  General:  normal appearance.   Pulses:  dorsalis pedis intact bilat (but decreased from normal, prob due to edema).    Extremities:  there are bilat ankles deformities (charcot joints) there is moderate bilat leg erythema, and healed ulcers, but no warmth.   1+ right pedal edema, 1+ left pedal edema, and mycotic toenails.   ther skin of the feet is very dry. Neurologic:  sensation is intact to touch on the feet, but severely decreased from normal.  Additional Exam:  Hemoglobin A1C       [H]  7.5 %  Cholesterol LDL       158.5 mg/dL   Impression & Recommendations:  Problem # 1:  hyperkalemia mild.  could be exac by benicar.  Problem # 2:  HYPERCHOLESTEROLEMIA (ICD-272.0) therapy is limited by multiple perceived drug intolerances  Problem # 3:  DIABETES MELLITUS, TYPE I (ICD-250.01) needs increased rx  Problem # 4:  PERIPHERAL NEUROPATHY (ICD-356.9) with painful component  Medications Added to Medication List This Visit: 1)  Humulin R 100 Unit/ml Inj Soln (Insulin regular human) .... 31 units three times a day (just before each meal) 2)  Hydrocodone-acetaminophen 10-325 Mg Tabs (Hydrocodone-acetaminophen) .... 1/2-1 every 4 as needed for pain. 3)  Hydrocodone-acetaminophen 10-325 Mg Tabs (Hydrocodone-acetaminophen) .... 1/2-1 every 4 as needed for pain.  please cancel the refill due  in january 4)  Benicar 20 Mg Tabs (Olmesartan medoxomil) .[name].. 1 tab once daily  Other Orders: Flu Vaccine 20yrs + MEDICARE PATIENTS (O1308) Administration Flu vaccine - MCR (G0008) TLB-Lipid Panel (80061-LIPID) TLB-CBC Platelet - w/Differential (85025-CBCD) TLB-Hepatic/Liver Function Pnl (80076-HEPATIC) TLB-TSH (Thyroid Stimulating Hormone) (84443-TSH) TLB-A1C / Hgb A1C (Glycohemoglobin) (83036-A1C) TLB-PSA (Prostate Specific Antigen) (84153-PSA) TLB-Udip w/ Micro (81001-URINE) TLB-Microalbumin/Creat Ratio, Urine (82043-MALB) TLB-BMP (Basic Metabolic Panel-BMET) (80048-METABOL) TLB-IBC Pnl (Iron/FE;Transferrin) (83550-IBC) Vascular Clinic (Vascular) Est. Patient Level IV (65784)  Patient Instructions: 1)  blood tests are being ordered for you today.  please call [phone] to hear your test results. 2)  pending the test results, please: 3)  continue regular insulin (just before each meal) 32-27-27 units 4)  same nph (25 units at night). 5)  Please schedule a physical appointment in 3 months. 6)  check circulation test of the legs.  you will be called with a day and time for an appointment. 7)  your next refill of pain medication will be due soon after the new year.   8)  (update: i left message on phone-tree:  increase reg insulin to 31 units three times a day (just before each meal).  you shoud consider increasing colesstid.  reduce benicar to 20/d) Prescriptions: HYDROCODONE-ACETAMINOPHEN 10-325 MG TABS (HYDROCODONE-ACETAMINOPHEN) 1/2-1 every 4 as needed for pain.  please cancel the refill due in january  #60 x 0   Entered and Authorized by:   Minus Breeding MD   Signed by:   Minus Breeding MD on 07/19/2010   Method used:   Print then Give to Patient   RxID:   [id number]  addendum:  i have cancelled this refill.  Orders Added: 1)  Flu Vaccine 45yrs + MEDICARE PATIENTS [Q2039] 2)  Administration Flu vaccine - MCR [G0008] 3)  TLB-Lipid Panel [80061-LIPID] 4)  TLB-CBC  Platelet - w/Differential [85025-CBCD] 5)  TLB-Hepatic/Liver Function Pnl [80076-HEPATIC] 6)  TLB-TSH (Thyroid Stimulating Hormone) [84443-TSH] 7)  TLB-A1C / Hgb A1C (Glycohemoglobin) [83036-A1C] 8)  TLB-PSA (Prostate Specific Antigen) [84153-PSA] 9)  TLB-Udip w/ Micro [81001-URINE] 10)  TLB-Microalbumin/Creat Ratio, Urine [82043-MALB] 11)  TLB-BMP (Basic Metabolic Panel-BMET) [80048-METABOL] 12)  TLB-IBC Pnl (Iron/FE;Transferrin) [83550-IBC] 13)  Vascular Clinic [Vascular] 14)  Est. Patient Level IV [64403]   Immunizations Administered:  Influenza Vaccine # 1:    Vaccine Type: Fluvax MCR    Site: left deltoid    Mfr: [employer] Pasteur    Dose: 0.5 ml    Route: IM    Given by: Lamar Sprinkles, CMA    Exp. Date: 02/04/2011    Lot #: [id number]    VIS given: 03/01/10 version given July 19, 2010.   Immunizations Administered:  Influenza Vaccine # 1:    Vaccine Type: Fluvax MCR    Site: left deltoid    Mfr: [employer]  Dose: 0.5 ml    Route: IM    Given by: Lamar Sprinkles, CMA    Exp. Date: 02/04/2011    Lot #: [id number]    VIS given: 03/01/10 version given July 19, 2010.

## 2010-09-08 NOTE — Progress Notes (Signed)
Summary: OV due  Phone Note Outgoing Call   Call placed by: Brenton Grills MA,  March 09, 2010 3:54 PM Summary of Call: Per MD, f/u OV is due. Pt informed and stated that he will call in to set up OV.

## 2010-09-08 NOTE — Miscellaneous (Signed)
Summary: Orders Update  Clinical Lists Changes  Orders: Added new Test order of Arterial Duplex Lower Extremity (Arterial Duplex Low) - Signed 

## 2010-09-08 NOTE — Assessment & Plan Note (Signed)
Summary: [facility]   Visit Type:  Initial Consult Primary Provider:  Minus Breeding MD  CC:  Peripherall vascular insuff.  History of Present Illness: 65 year-old male presenting for initial evaluation of lower extremity PAD. He has longstanding diabetes and chronic leg swelling, right greater than left. He has been treated at the wound care center for stasis ulcerations. His main complaint regarding his legs is constant pain throughout both legs, described as an ache. This is not affected by walking. The patient does very little walking. He has chronic left hip pain and is severely limited by this. He is in a motorized scooter today.  ABI's are 0.7 bilaterally and arterial waveforms suggest proximal/inflow disease bilaterally right greater than left. No infraninguinal stenosis was identified.  He also complains of shortness of breath after he elevates his legs for a day. He denies orthopnea or PND.  Current Medications (verified): 1)  Lasix 40 Mg  Tabs (Furosemide) .... Take 2 Po Once Daily 2)  Adult Aspirin Low Strength 81 Mg  Tbdp (Aspirin) .... Take 1 By Mouth Qd 3)  Humulin R 100 Unit/ml Inj Soln (Insulin Regular Human) .... Three Times A Day (Just Before Each Meal) 31-31-35 Units 4)  Humulin N 100 Unit/ml  Susp (Insulin Isophane Human) .... 25 Units Qhs 5)  Clotrimazole-Betamethasone 1-0.05 % Crea (Clotrimazole-Betamethasone) .... Three Times A Day As Needed Rash 6)  Hydrocodone-Acetaminophen 10-325 Mg Tabs (Hydrocodone-Acetaminophen) .... 1/2-1 Every 4 As Needed For Pain. 7)  Carvedilol 3.125 Mg Tabs (Carvedilol) .[name].. 1 Tab Two Times A Day 8)  Insulin Syringe 31g X 5/16" 0.5 Ml Misc (Insulin Syringe-Needle U-100) .... 4x A Day 9)  Gabapentin 100 Mg Caps (Gabapentin) .[name].. 1 Tab At Bedtime  Past History:  Past medical, surgical, family and social histories (including risk factors) reviewed, and no changes noted (except as noted below).  Past Medical History: Reviewed  history from 09/23/2008 and no changes required. Coronary artery disease Diabetes mellitus, type I Hypertension DM Retinopathy/Blind Dyslipidemia left foot charcot deformity/chronic pain LE venous insufficiency Anemia-iron deficiency Cerebrovascular accident, hx of/RUE weakness Depression hypogonadism Peripheral neuropathy Allergic rhinitis non compliance E.D. Congestive heart failure Myocardial infarction, hx of  Past Surgical History: Reviewed history from 03/09/2009 and no changes required. Coronary artery bypass graft Sigmoidoscopy (03/11/2001) Venous doppler (01/28/2004) Carpal tunnel release - left s/p right wrist surgery - bone graft/fusion c-spine surgery 2010 for disc dr Yetta Barre  Family History: Reviewed history from 09/23/2008 and no changes required. no cancer grandmother with DM  Social History: Reviewed history from 08/16/2010 and no changes required. Married disabled Alcohol use-no Never Smoked comes to appointments with wife  Review of Systems       Negative except as per HPI   Vital Signs:  Patient profile:   65 year old male Height:      65 inches Weight:      264.50 pounds BMI:     44.17 Pulse rate:   84 / minute Pulse rhythm:   regular Resp:     18 per minute BP sitting:   156 / 72  (left arm) Cuff size:   large  Vitals Entered By: Vikki Ports (August 30, 2010 3:45 PM)  Serial Vital Signs/Assessments:  Time      Position  BP       Pulse  Resp  Temp     By           R Arm     130/70  Vikki Ports   Physical Exam  General:  Pt is well-developed, alert and oriented, morbidly obese male in no acute distress HEENT: normal Neck: no thyromegaly           JVP normal, carotid upstrokes normal without bruits Lungs: CTA Chest: equal expansion  CV: Apical impulse nondisplaced, RRR without murmur or gallop Abd: soft, NT, positive BS, no HSM, no bruit, obese with large pannus Back: no CVA tenderness Ext: 3+ edema  right leg, 2+ edema left leg few venous stasis ulcers on anterior shin area bilaterally with clean bases and no discharge Skin: bilateral stasis dermatitis     ABI's  Procedure date:  08/12/2010  Findings:      0.72 right, 0.71 left probable aorto-iliac stenosis with abnormal right CFA waveforms and abnormal left SFA waveforms. No significant infrainguinal stenosis  Impression & Recommendations:  Problem # 1:  PERIPHERAL VASCULAR INSUFFICIENCY,  LEGS, BILATERAL (ICD-443.9) This patient has mixed arterial and venous disease of his legs. He also has significant peripheral neuropathy with associated chronic pain. The patient's evaluation is complicated by morbid obesity with large abdominal pannus. I would normally consider a CT angiogram of his aortoiliac vessels but his creatinine has been elevated in the range of 1.7 mg/dL most recently. This is in the setting of longstanding diabetes which increases his risk for contrast nephropathy. I think a catheter-based procedure would be very difficult considering the patient's body habitus. In the absence of critical limb ischemia, I would favor observation for now. Will see the patient back in followup in 6 months for repeat evaluation.  Problem # 2:  DYSPNEA (ICD-786.05) PT at risk for CHF (diabetes, CAD s/p CABG). Will check a 2D echo. BNP has been normal on recent evaluation.  The following medications were removed from the medication list:    Benicar 20 Mg Tabs (Olmesartan medoxomil) .[name]... 1 tab once daily His updated medication list for this problem includes:    Lasix 40 Mg Tabs (Furosemide) .[name]... Take 2 po once daily    Adult Aspirin Low Strength 81 Mg Tbdp (Aspirin) .[name]... Take 1 by mouth qd    Carvedilol 3.125 Mg Tabs (Carvedilol) .[name]... 1 tab two times a day  Orders: Echocardiogram (Echo)  Patient Instructions: 1)  Your physician recommends that you schedule a follow-up appointment in: 6 months 2)  Your physician recommends that  you continue on your current medications as directed. Please refer to the Current Medication list given to you today. 3)  Your physician has requested that you have an echocardiogram.  Echocardiography is a painless test that uses sound waves to create images of your heart. It provides your doctor with information about the size and shape of your heart and how well your heart's chambers and valves are working.  This procedure takes approximately one hour. There are no restrictions for this procedure.

## 2010-09-08 NOTE — Consult Note (Signed)
[facility]   Imported By: Lester Los Arcos 06/06/2010 07:30:41  _____________________________________________________________________  External Attachment:    Type:   Image     Comment:   External Document

## 2010-09-08 NOTE — Letter (Signed)
Summary: [facility] for Diabetic Shoes & inserts/[facility]  [facility] for Diabetic Shoes & inserts/[facility]   Imported By: Sherian Rein 01/14/2010 13:32:48  _____________________________________________________________________  External Attachment:    Type:   Image     Comment:   External Document

## 2010-09-08 NOTE — Letter (Signed)
Summary: [facility] for Glucose Monitor/[facility]  [facility] Glucose Monitor/[facility]   Imported By: Sherian Rein 11/23/2009 08:16:47  _____________________________________________________________________  External Attachment:    Type:   Image     Comment:   External Document

## 2010-09-14 ENCOUNTER — Ambulatory Visit (HOSPITAL_COMMUNITY): Payer: MEDICARE | Attending: Cardiovascular Disease

## 2010-09-14 DIAGNOSIS — I079 Rheumatic tricuspid valve disease, unspecified: Secondary | ICD-10-CM | POA: Insufficient documentation

## 2010-09-14 DIAGNOSIS — E119 Type 2 diabetes mellitus without complications: Secondary | ICD-10-CM | POA: Insufficient documentation

## 2010-09-14 DIAGNOSIS — E785 Hyperlipidemia, unspecified: Secondary | ICD-10-CM | POA: Insufficient documentation

## 2010-09-14 DIAGNOSIS — R0989 Other specified symptoms and signs involving the circulatory and respiratory systems: Secondary | ICD-10-CM | POA: Insufficient documentation

## 2010-09-14 DIAGNOSIS — M7989 Other specified soft tissue disorders: Secondary | ICD-10-CM | POA: Insufficient documentation

## 2010-09-14 DIAGNOSIS — R0602 Shortness of breath: Secondary | ICD-10-CM

## 2010-09-14 DIAGNOSIS — I252 Old myocardial infarction: Secondary | ICD-10-CM | POA: Insufficient documentation

## 2010-09-14 DIAGNOSIS — R0609 Other forms of dyspnea: Secondary | ICD-10-CM | POA: Insufficient documentation

## 2010-09-14 DIAGNOSIS — E669 Obesity, unspecified: Secondary | ICD-10-CM | POA: Insufficient documentation

## 2010-10-25 LAB — GLUCOSE, CAPILLARY
Glucose-Capillary: 105 mg/dL — ABNORMAL HIGH (ref 70–99)
Glucose-Capillary: 152 mg/dL — ABNORMAL HIGH (ref 70–99)
Glucose-Capillary: 162 mg/dL — ABNORMAL HIGH (ref 70–99)
Glucose-Capillary: 181 mg/dL — ABNORMAL HIGH (ref 70–99)
Glucose-Capillary: 183 mg/dL — ABNORMAL HIGH (ref 70–99)
Glucose-Capillary: 73 mg/dL (ref 70–99)
Glucose-Capillary: 83 mg/dL (ref 70–99)
Glucose-Capillary: 90 mg/dL (ref 70–99)
Glucose-Capillary: 92 mg/dL (ref 70–99)

## 2010-10-25 LAB — URINALYSIS, ROUTINE W REFLEX MICROSCOPIC
Hgb urine dipstick: NEGATIVE
Ketones, ur: NEGATIVE mg/dL
Protein, ur: NEGATIVE mg/dL
Urobilinogen, UA: 0.2 mg/dL (ref 0.0–1.0)

## 2010-10-25 LAB — CBC
HCT: 37.2 % — ABNORMAL LOW (ref 39.0–52.0)
Hemoglobin: 11.7 g/dL — ABNORMAL LOW (ref 13.0–17.0)
Hemoglobin: 12.3 g/dL — ABNORMAL LOW (ref 13.0–17.0)
Hemoglobin: 14.5 g/dL (ref 13.0–17.0)
MCHC: 32.9 g/dL (ref 30.0–36.0)
MCHC: 33 g/dL (ref 30.0–36.0)
MCHC: 33.2 g/dL (ref 30.0–36.0)
MCHC: 33.5 g/dL (ref 30.0–36.0)
MCV: 89.3 fL (ref 78.0–100.0)
MCV: 89.7 fL (ref 78.0–100.0)
MCV: 90.3 fL (ref 78.0–100.0)
MCV: 90.7 fL (ref 78.0–100.0)
Platelets: 144 10*3/uL — ABNORMAL LOW (ref 150–400)
Platelets: 151 10*3/uL (ref 150–400)
RBC: 3.88 MIL/uL — ABNORMAL LOW (ref 4.22–5.81)
RBC: 3.98 MIL/uL — ABNORMAL LOW (ref 4.22–5.81)
RBC: 4.14 MIL/uL — ABNORMAL LOW (ref 4.22–5.81)
RBC: 4.83 MIL/uL (ref 4.22–5.81)
RDW: 14.7 % (ref 11.5–15.5)
RDW: 15.1 % (ref 11.5–15.5)
WBC: 7.7 10*3/uL (ref 4.0–10.5)
WBC: 9.4 10*3/uL (ref 4.0–10.5)

## 2010-10-25 LAB — DIFFERENTIAL
Basophils Relative: 0 % (ref 0–1)
Eosinophils Absolute: 0.1 10*3/uL (ref 0.0–0.7)
Monocytes Absolute: 0.9 10*3/uL (ref 0.1–1.0)
Monocytes Relative: 7 % (ref 3–12)
Neutrophils Relative %: 89 % — ABNORMAL HIGH (ref 43–77)

## 2010-10-25 LAB — BASIC METABOLIC PANEL
BUN: 16 mg/dL (ref 6–23)
BUN: 19 mg/dL (ref 6–23)
CO2: 28 mEq/L (ref 19–32)
CO2: 28 mEq/L (ref 19–32)
CO2: 30 mEq/L (ref 19–32)
CO2: 30 mEq/L (ref 19–32)
Calcium: 7.8 mg/dL — ABNORMAL LOW (ref 8.4–10.5)
Calcium: 8 mg/dL — ABNORMAL LOW (ref 8.4–10.5)
Calcium: 8 mg/dL — ABNORMAL LOW (ref 8.4–10.5)
Chloride: 101 mEq/L (ref 96–112)
Chloride: 103 mEq/L (ref 96–112)
Chloride: 97 mEq/L (ref 96–112)
Chloride: 98 mEq/L (ref 96–112)
Creatinine, Ser: 1.31 mg/dL (ref 0.4–1.5)
Creatinine, Ser: 1.34 mg/dL (ref 0.4–1.5)
Creatinine, Ser: 1.36 mg/dL (ref 0.4–1.5)
Creatinine, Ser: 1.54 mg/dL — ABNORMAL HIGH (ref 0.4–1.5)
GFR calc Af Amer: 55 mL/min — ABNORMAL LOW (ref >60)
GFR calc Af Amer: >60 mL/min (ref >60)
GFR calc Af Amer: >60 mL/min (ref >60)
GFR calc Af Amer: >60 mL/min (ref >60)
Glucose, Bld: 101 mg/dL — ABNORMAL HIGH (ref 70–99)
Glucose, Bld: 102 mg/dL — ABNORMAL HIGH (ref 70–99)
Potassium: 4.2 mEq/L (ref 3.5–5.1)
Sodium: 133 mEq/L — ABNORMAL LOW (ref 135–145)
Sodium: 137 mEq/L (ref 135–145)

## 2010-10-25 LAB — URINE CULTURE

## 2010-10-25 LAB — LIPID PANEL
Cholesterol: 147 mg/dL (ref 0–200)
HDL: 45 mg/dL (ref >39)
LDL Cholesterol: 84 mg/dL (ref 0–99)
Total CHOL/HDL Ratio: 3.3 RATIO
Triglycerides: 92 mg/dL (ref <150)

## 2010-10-25 LAB — FUNGAL STAIN: Fungal Smear: NONE SEEN

## 2010-10-25 LAB — CULTURE, BLOOD (ROUTINE X 2)
Culture: NO GROWTH
Culture: NO GROWTH

## 2010-11-07 ENCOUNTER — Other Ambulatory Visit: Payer: Self-pay | Admitting: Endocrinology

## 2010-11-17 LAB — CBC
HCT: 42.2 % (ref 39.0–52.0)
Hemoglobin: 14.1 g/dL (ref 13.0–17.0)
RDW: 14.5 % (ref 11.5–15.5)

## 2010-11-17 LAB — DIFFERENTIAL
Basophils Absolute: 0 10*3/uL (ref 0.0–0.1)
Eosinophils Relative: 4 % (ref 0–5)
Lymphocytes Relative: 21 % (ref 12–46)
Lymphs Abs: 1.5 10*3/uL (ref 0.7–4.0)
Monocytes Absolute: 0.7 10*3/uL (ref 0.1–1.0)

## 2010-11-17 LAB — GLUCOSE, CAPILLARY
Glucose-Capillary: 120 mg/dL — ABNORMAL HIGH (ref 70–99)
Glucose-Capillary: 190 mg/dL — ABNORMAL HIGH (ref 70–99)
Glucose-Capillary: 95 mg/dL (ref 70–99)

## 2010-11-17 LAB — BASIC METABOLIC PANEL
CO2: 25 mEq/L (ref 19–32)
Chloride: 105 mEq/L (ref 96–112)
GFR calc non Af Amer: >60 mL/min (ref >60)
Glucose, Bld: 101 mg/dL — ABNORMAL HIGH (ref 70–99)
Potassium: 4.7 mEq/L (ref 3.5–5.1)
Sodium: 138 mEq/L (ref 135–145)

## 2010-11-20 ENCOUNTER — Emergency Department (HOSPITAL_COMMUNITY)
Admission: EM | Admit: 2010-11-20 | Discharge: 2010-11-21 | Disposition: A | Discharge status: Home / Self Care | Payer: Medicare Other | Attending: Emergency Medicine | Admitting: Emergency Medicine

## 2010-11-20 DIAGNOSIS — E119 Type 2 diabetes mellitus without complications: Secondary | ICD-10-CM | POA: Insufficient documentation

## 2010-11-20 DIAGNOSIS — I25810 Atherosclerosis of coronary artery bypass graft(s) without angina pectoris: Secondary | ICD-10-CM | POA: Insufficient documentation

## 2010-11-20 DIAGNOSIS — K5289 Other specified noninfective gastroenteritis and colitis: Secondary | ICD-10-CM | POA: Insufficient documentation

## 2010-11-20 DIAGNOSIS — Z794 Long term (current) use of insulin: Secondary | ICD-10-CM | POA: Insufficient documentation

## 2010-11-20 DIAGNOSIS — I10 Essential (primary) hypertension: Secondary | ICD-10-CM | POA: Insufficient documentation

## 2010-11-21 ENCOUNTER — Emergency Department (HOSPITAL_COMMUNITY): Payer: Medicare Other

## 2010-11-21 LAB — DIFFERENTIAL
Basophils Absolute: 0 10*3/uL (ref 0.0–0.1)
Basophils Relative: 0 % (ref 0–1)
Eosinophils Absolute: 0 10*3/uL (ref 0.0–0.7)
Eosinophils Relative: 0 % (ref 0–5)
Monocytes Absolute: 1 10*3/uL (ref 0.1–1.0)
Neutro Abs: 6.5 10*3/uL (ref 1.7–7.7)
Neutrophils Relative %: 78 % — ABNORMAL HIGH (ref 43–77)

## 2010-11-21 LAB — CBC
HCT: 46.3 % (ref 39.0–52.0)
MCHC: 33.7 g/dL (ref 30.0–36.0)
Platelets: 194 10*3/uL (ref 150–400)
RDW: 14.4 % (ref 11.5–15.5)
WBC: 8.3 10*3/uL (ref 4.0–10.5)

## 2010-11-21 LAB — URINALYSIS, ROUTINE W REFLEX MICROSCOPIC
Glucose, UA: NEGATIVE mg/dL
Leukocytes, UA: NEGATIVE
Nitrite: NEGATIVE
Protein, ur: 30 mg/dL — AB

## 2010-11-21 LAB — COMPREHENSIVE METABOLIC PANEL
ALT: 32 U/L (ref 0–53)
Albumin: 3.6 g/dL (ref 3.5–5.2)
Calcium: 8.8 mg/dL (ref 8.4–10.5)
GFR calc Af Amer: >60 mL/min (ref >60)
Glucose, Bld: 103 mg/dL — ABNORMAL HIGH (ref 70–99)
Potassium: 4.2 mEq/L (ref 3.5–5.1)
Sodium: 134 mEq/L — ABNORMAL LOW (ref 135–145)
Total Protein: 7.5 g/dL (ref 6.0–8.3)

## 2010-11-21 LAB — LIPASE, BLOOD: Lipase: 36 U/L (ref 11–59)

## 2010-11-21 LAB — URINE MICROSCOPIC-ADD ON

## 2010-11-30 ENCOUNTER — Emergency Department (HOSPITAL_COMMUNITY)
Admission: EM | Admit: 2010-11-30 | Discharge: 2010-11-30 | Disposition: A | Discharge status: Home / Self Care | Payer: Medicare Other | Attending: Emergency Medicine | Admitting: Emergency Medicine

## 2010-11-30 ENCOUNTER — Emergency Department (HOSPITAL_COMMUNITY): Payer: Medicare Other

## 2010-11-30 DIAGNOSIS — M545 Low back pain, unspecified: Secondary | ICD-10-CM | POA: Insufficient documentation

## 2010-11-30 DIAGNOSIS — I4949 Other premature depolarization: Secondary | ICD-10-CM | POA: Insufficient documentation

## 2010-11-30 DIAGNOSIS — S20229A Contusion of unspecified back wall of thorax, initial encounter: Secondary | ICD-10-CM | POA: Insufficient documentation

## 2010-11-30 DIAGNOSIS — R296 Repeated falls: Secondary | ICD-10-CM | POA: Insufficient documentation

## 2010-11-30 DIAGNOSIS — E119 Type 2 diabetes mellitus without complications: Secondary | ICD-10-CM | POA: Insufficient documentation

## 2010-11-30 DIAGNOSIS — Z8673 Personal history of transient ischemic attack (TIA), and cerebral infarction without residual deficits: Secondary | ICD-10-CM | POA: Insufficient documentation

## 2010-11-30 DIAGNOSIS — I10 Essential (primary) hypertension: Secondary | ICD-10-CM | POA: Insufficient documentation

## 2010-11-30 DIAGNOSIS — I25810 Atherosclerosis of coronary artery bypass graft(s) without angina pectoris: Secondary | ICD-10-CM | POA: Insufficient documentation

## 2010-11-30 DIAGNOSIS — Y92009 Unspecified place in unspecified non-institutional (private) residence as the place of occurrence of the external cause: Secondary | ICD-10-CM | POA: Insufficient documentation

## 2010-11-30 LAB — CBC
HCT: 33.5 % — ABNORMAL LOW (ref 39.0–52.0)
Hemoglobin: 10.4 g/dL — ABNORMAL LOW (ref 13.0–17.0)
MCH: 27.1 pg (ref 26.0–34.0)
MCHC: 31 g/dL (ref 30.0–36.0)
MCV: 87.2 fL (ref 78.0–100.0)
RDW: 14.1 % (ref 11.5–15.5)

## 2010-11-30 LAB — URINALYSIS, ROUTINE W REFLEX MICROSCOPIC
Bilirubin Urine: NEGATIVE
Hgb urine dipstick: NEGATIVE
Nitrite: NEGATIVE
Specific Gravity, Urine: 1.024 (ref 1.005–1.030)
Urobilinogen, UA: 0.2 mg/dL (ref 0.0–1.0)

## 2010-11-30 LAB — GLUCOSE, CAPILLARY: Glucose-Capillary: 208 mg/dL — ABNORMAL HIGH (ref 70–99)

## 2010-11-30 LAB — BASIC METABOLIC PANEL
BUN: 54 mg/dL — ABNORMAL HIGH (ref 6–23)
CO2: 25 mEq/L (ref 19–32)
Calcium: 8.6 mg/dL (ref 8.4–10.5)
GFR calc non Af Amer: >60 mL/min (ref >60)
Glucose, Bld: 240 mg/dL — ABNORMAL HIGH (ref 70–99)

## 2010-11-30 LAB — DIFFERENTIAL
Basophils Absolute: 0 10*3/uL (ref 0.0–0.1)
Eosinophils Relative: 0 % (ref 0–5)
Lymphocytes Relative: 14 % (ref 12–46)
Lymphs Abs: 1.6 10*3/uL (ref 0.7–4.0)
Monocytes Absolute: 0.6 10*3/uL (ref 0.1–1.0)
Monocytes Relative: 6 % (ref 3–12)
Neutro Abs: 9.1 10*3/uL — ABNORMAL HIGH (ref 1.7–7.7)

## 2010-12-01 ENCOUNTER — Ambulatory Visit: Payer: MEDICARE | Admitting: Endocrinology

## 2010-12-03 ENCOUNTER — Inpatient Hospital Stay (HOSPITAL_COMMUNITY)
Admission: EM | Admit: 2010-12-03 | Discharge: 2010-12-07 | DRG: 378 | Disposition: A | Discharge status: Home / Self Care | Payer: Medicare Other | Attending: Internal Medicine | Admitting: Internal Medicine

## 2010-12-03 DIAGNOSIS — E785 Hyperlipidemia, unspecified: Secondary | ICD-10-CM | POA: Diagnosis present

## 2010-12-03 DIAGNOSIS — K264 Chronic or unspecified duodenal ulcer with hemorrhage: Principal | ICD-10-CM | POA: Diagnosis present

## 2010-12-03 DIAGNOSIS — R109 Unspecified abdominal pain: Secondary | ICD-10-CM | POA: Diagnosis present

## 2010-12-03 DIAGNOSIS — I2510 Atherosclerotic heart disease of native coronary artery without angina pectoris: Secondary | ICD-10-CM | POA: Diagnosis present

## 2010-12-03 DIAGNOSIS — M1990 Unspecified osteoarthritis, unspecified site: Secondary | ICD-10-CM | POA: Diagnosis present

## 2010-12-03 DIAGNOSIS — M549 Dorsalgia, unspecified: Secondary | ICD-10-CM | POA: Diagnosis present

## 2010-12-03 DIAGNOSIS — L97909 Non-pressure chronic ulcer of unspecified part of unspecified lower leg with unspecified severity: Secondary | ICD-10-CM | POA: Diagnosis present

## 2010-12-03 DIAGNOSIS — Z951 Presence of aortocoronary bypass graft: Secondary | ICD-10-CM

## 2010-12-03 DIAGNOSIS — Z8673 Personal history of transient ischemic attack (TIA), and cerebral infarction without residual deficits: Secondary | ICD-10-CM

## 2010-12-03 DIAGNOSIS — E1139 Type 2 diabetes mellitus with other diabetic ophthalmic complication: Secondary | ICD-10-CM | POA: Diagnosis present

## 2010-12-03 DIAGNOSIS — H543 Unqualified visual loss, both eyes: Secondary | ICD-10-CM | POA: Diagnosis present

## 2010-12-03 DIAGNOSIS — D62 Acute posthemorrhagic anemia: Secondary | ICD-10-CM | POA: Diagnosis present

## 2010-12-03 DIAGNOSIS — I872 Venous insufficiency (chronic) (peripheral): Secondary | ICD-10-CM | POA: Diagnosis present

## 2010-12-03 DIAGNOSIS — I10 Essential (primary) hypertension: Secondary | ICD-10-CM | POA: Diagnosis present

## 2010-12-03 DIAGNOSIS — Z794 Long term (current) use of insulin: Secondary | ICD-10-CM

## 2010-12-04 ENCOUNTER — Other Ambulatory Visit: Payer: Self-pay | Admitting: Gastroenterology

## 2010-12-04 LAB — URINALYSIS, ROUTINE W REFLEX MICROSCOPIC
Bilirubin Urine: NEGATIVE
Glucose, UA: NEGATIVE mg/dL
Hgb urine dipstick: NEGATIVE
Protein, ur: NEGATIVE mg/dL
Specific Gravity, Urine: 1.012 (ref 1.005–1.030)
Urobilinogen, UA: 0.2 mg/dL (ref 0.0–1.0)

## 2010-12-04 LAB — DIFFERENTIAL
Basophils Relative: 0 % (ref 0–1)
Eosinophils Absolute: 0.2 10*3/uL (ref 0.0–0.7)
Neutro Abs: 8.3 10*3/uL — ABNORMAL HIGH (ref 1.7–7.7)
Neutrophils Relative %: 75 % (ref 43–77)

## 2010-12-04 LAB — GLUCOSE, CAPILLARY: Glucose-Capillary: 70 mg/dL (ref 70–99)

## 2010-12-04 LAB — COMPREHENSIVE METABOLIC PANEL
ALT: 21 U/L (ref 0–53)
AST: 18 U/L (ref 0–37)
Albumin: 3.3 g/dL — ABNORMAL LOW (ref 3.5–5.2)
Alkaline Phosphatase: 53 U/L (ref 39–117)
CO2: 29 mEq/L (ref 19–32)
Chloride: 97 mEq/L (ref 96–112)
GFR calc Af Amer: >60 mL/min (ref >60)
Potassium: 3.7 mEq/L (ref 3.5–5.1)
Sodium: 134 mEq/L — ABNORMAL LOW (ref 135–145)
Total Bilirubin: 0.3 mg/dL (ref 0.3–1.2)

## 2010-12-04 LAB — CBC
Hemoglobin: 9.5 g/dL — ABNORMAL LOW (ref 13.0–17.0)
Platelets: 283 10*3/uL (ref 150–400)
RBC: 3.33 MIL/uL — ABNORMAL LOW (ref 4.22–5.81)
WBC: 11 10*3/uL — ABNORMAL HIGH (ref 4.0–10.5)

## 2010-12-04 LAB — HEMOGLOBIN AND HEMATOCRIT, BLOOD
HCT: 26.5 % — ABNORMAL LOW (ref 39.0–52.0)
HCT: 23.8 % — ABNORMAL LOW (ref 39.0–52.0)

## 2010-12-04 LAB — URINE MICROSCOPIC-ADD ON

## 2010-12-04 LAB — PREPARE RBC (CROSSMATCH)

## 2010-12-05 DIAGNOSIS — D62 Acute posthemorrhagic anemia: Secondary | ICD-10-CM

## 2010-12-05 DIAGNOSIS — K253 Acute gastric ulcer without hemorrhage or perforation: Secondary | ICD-10-CM

## 2010-12-05 DIAGNOSIS — D379 Neoplasm of uncertain behavior of digestive organ, unspecified: Secondary | ICD-10-CM

## 2010-12-05 DIAGNOSIS — K921 Melena: Secondary | ICD-10-CM

## 2010-12-05 LAB — GLUCOSE, CAPILLARY: Glucose-Capillary: 110 mg/dL — ABNORMAL HIGH (ref 70–99)

## 2010-12-05 LAB — BASIC METABOLIC PANEL
BUN: 7 mg/dL (ref 6–23)
CO2: 28 mEq/L (ref 19–32)
CO2: 26 mEq/L (ref 19–32)
Calcium: 8.4 mg/dL (ref 8.4–10.5)
Calcium: 8.2 mg/dL — ABNORMAL LOW (ref 8.4–10.5)
Chloride: 104 mEq/L (ref 96–112)
Creatinine, Ser: 0.97 mg/dL (ref 0.4–1.5)
Creatinine, Ser: 1.11 mg/dL (ref 0.4–1.5)
GFR calc Af Amer: >60 mL/min (ref >60)
Glucose, Bld: 102 mg/dL — ABNORMAL HIGH (ref 70–99)
Glucose, Bld: 165 mg/dL — ABNORMAL HIGH (ref 70–99)

## 2010-12-05 LAB — CBC
HCT: 27.3 % — ABNORMAL LOW (ref 39.0–52.0)
Hemoglobin: 8.6 g/dL — ABNORMAL LOW (ref 13.0–17.0)
MCH: 27.9 pg (ref 26.0–34.0)
MCHC: 31.5 g/dL (ref 30.0–36.0)
MCV: 88.6 fL (ref 78.0–100.0)

## 2010-12-06 ENCOUNTER — Ambulatory Visit: Payer: MEDICARE | Admitting: Endocrinology

## 2010-12-06 DIAGNOSIS — D62 Acute posthemorrhagic anemia: Secondary | ICD-10-CM

## 2010-12-06 DIAGNOSIS — D379 Neoplasm of uncertain behavior of digestive organ, unspecified: Secondary | ICD-10-CM

## 2010-12-06 DIAGNOSIS — K253 Acute gastric ulcer without hemorrhage or perforation: Secondary | ICD-10-CM

## 2010-12-06 DIAGNOSIS — K921 Melena: Secondary | ICD-10-CM

## 2010-12-06 LAB — CBC
HCT: 24.8 % — ABNORMAL LOW (ref 39.0–52.0)
HCT: 32.2 % — ABNORMAL LOW (ref 39.0–52.0)
MCH: 28.2 pg (ref 26.0–34.0)
MCV: 88.6 fL (ref 78.0–100.0)
Platelets: 243 10*3/uL (ref 150–400)
RBC: 2.8 MIL/uL — ABNORMAL LOW (ref 4.22–5.81)
RDW: 15.2 % (ref 11.5–15.5)
WBC: 9.4 10*3/uL (ref 4.0–10.5)

## 2010-12-06 LAB — BASIC METABOLIC PANEL
BUN: 4 mg/dL — ABNORMAL LOW (ref 6–23)
Chloride: 107 mEq/L (ref 96–112)
Creatinine, Ser: 1.05 mg/dL (ref 0.4–1.5)
Glucose, Bld: 148 mg/dL — ABNORMAL HIGH (ref 70–99)
Potassium: 3.7 mEq/L (ref 3.5–5.1)

## 2010-12-06 LAB — HEMOGLOBIN AND HEMATOCRIT, BLOOD
HCT: 27.4 % — ABNORMAL LOW (ref 39.0–52.0)
Hemoglobin: 8.7 g/dL — ABNORMAL LOW (ref 13.0–17.0)

## 2010-12-06 LAB — GLUCOSE, CAPILLARY
Glucose-Capillary: 147 mg/dL — ABNORMAL HIGH (ref 70–99)
Glucose-Capillary: 147 mg/dL — ABNORMAL HIGH (ref 70–99)
Glucose-Capillary: 160 mg/dL — ABNORMAL HIGH (ref 70–99)
Glucose-Capillary: 173 mg/dL — ABNORMAL HIGH (ref 70–99)
Glucose-Capillary: 183 mg/dL — ABNORMAL HIGH (ref 70–99)

## 2010-12-07 ENCOUNTER — Other Ambulatory Visit: Payer: Self-pay | Admitting: Internal Medicine

## 2010-12-07 DIAGNOSIS — R933 Abnormal findings on diagnostic imaging of other parts of digestive tract: Secondary | ICD-10-CM

## 2010-12-07 DIAGNOSIS — K269 Duodenal ulcer, unspecified as acute or chronic, without hemorrhage or perforation: Secondary | ICD-10-CM

## 2010-12-07 LAB — BASIC METABOLIC PANEL
GFR calc Af Amer: >60 mL/min (ref >60)
GFR calc non Af Amer: >60 mL/min (ref >60)
Glucose, Bld: 98 mg/dL (ref 70–99)
Potassium: 3.5 mEq/L (ref 3.5–5.1)
Sodium: 137 mEq/L (ref 135–145)

## 2010-12-07 LAB — CBC
HCT: 31.1 % — ABNORMAL LOW (ref 39.0–52.0)
Platelets: 243 10*3/uL (ref 150–400)
RDW: 15.2 % (ref 11.5–15.5)
WBC: 7.4 10*3/uL (ref 4.0–10.5)

## 2010-12-07 LAB — TYPE AND SCREEN: Unit division: 0

## 2010-12-07 LAB — GLUCOSE, CAPILLARY
Glucose-Capillary: 102 mg/dL — ABNORMAL HIGH (ref 70–99)
Glucose-Capillary: 96 mg/dL (ref 70–99)
Glucose-Capillary: 114 mg/dL — ABNORMAL HIGH (ref 70–99)
Glucose-Capillary: 167 mg/dL — ABNORMAL HIGH (ref 70–99)

## 2010-12-07 LAB — URINE CULTURE: Culture  Setup Time: 201204291144

## 2010-12-08 ENCOUNTER — Encounter: Payer: Self-pay | Admitting: Internal Medicine

## 2010-12-14 ENCOUNTER — Telehealth: Payer: Self-pay | Admitting: Internal Medicine

## 2010-12-14 NOTE — Telephone Encounter (Signed)
Spoke with patient's wife and told her it looks by the letter sent to patient on 12/08/10 that the patient would see Dr. Juanda Chance on an as needed basis for further problems.

## 2010-12-14 NOTE — Telephone Encounter (Signed)
To be seen on as needed basis. DB

## 2010-12-15 ENCOUNTER — Encounter: Payer: Self-pay | Admitting: Endocrinology

## 2010-12-15 ENCOUNTER — Ambulatory Visit (INDEPENDENT_AMBULATORY_CARE_PROVIDER_SITE_OTHER): Payer: Medicare Other | Admitting: Endocrinology

## 2010-12-15 ENCOUNTER — Other Ambulatory Visit (INDEPENDENT_AMBULATORY_CARE_PROVIDER_SITE_OTHER): Payer: Medicare Other

## 2010-12-15 DIAGNOSIS — D509 Iron deficiency anemia, unspecified: Secondary | ICD-10-CM

## 2010-12-15 LAB — CBC WITH DIFFERENTIAL/PLATELET
Basophils Absolute: 0.1 10*3/uL (ref 0.0–0.1)
Eosinophils Absolute: 0.3 10*3/uL (ref 0.0–0.7)
Lymphocytes Relative: 21.1 % (ref 12.0–46.0)
Monocytes Relative: 9 % (ref 3.0–12.0)
Platelets: 254 10*3/uL (ref 150.0–400.0)
RDW: 15.4 % — ABNORMAL HIGH (ref 11.5–14.6)

## 2010-12-15 LAB — IBC PANEL
Iron: 76 ug/dL (ref 42–165)
Transferrin: 259.6 mg/dL (ref 212.0–360.0)

## 2010-12-15 NOTE — Discharge Summary (Signed)
Mark French, Mark French               ACCOUNT NO.:  [id number]  MEDICAL RECORD NO.:  [id number]           PATIENT TYPE:  I  LOCATION:  1407                         FACILITY:  [facility]  PHYSICIAN:  Rock Nephew, MD       DATE OF BIRTH:  1945/08/24  DATE OF ADMISSION:  12/03/2010 DATE OF DISCHARGE:  12/07/2010                        DISCHARGE SUMMARY - REFERRING   PRIMARY CARE PHYSICIAN:  [name], MD  DISCHARGE DIAGNOSES: 1. Upper gastrointestinal bleed secondary to duodenal ulcers. 2. Hypertension. 3. Diabetes mellitus. 4. Coronary artery disease. 5. Back pain. 6. 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 7. History of dyslipidemia. 8. History of morbid obesity. 9. Osteoarthritis. 10.Remote history of cerebrovascular accident. 11.History of chronic venous insufficiency with venous stasis ulcers     bilateral lower extremities.  DISCHARGE MEDICATIONS: 1. Colace 100 mg by mouth twice daily. 2. Ferrous sulfate 325 mg p.o. three times a day. 3. Protonix 40 mg p.o. b.i.d. 4. Carafate 1 g p.o. four times a day x14 days. 5. Aspirin 81 mg p.o. daily, enteric-coated. 6. Carvedilol 3.125 mg twice daily. 7. Clotrimazole-betamethasone cream 1 application topically three     times a day as needed for rash. 8. Gabapentin 100 mg 1 tablet by mouth daily at bedtime as needed. 9. Vicodin 1 to 2 tablets by mouth every 4 hours as needed. 10.Humulin N 22 units subcutaneously daily at bedtime. 11.Humulin R 15 to 25 units subcutaneously three times a day. 12.Zofran 8 mg 1 tablet by mouth twice daily.  DISPOSITION:  The patient is discharged home.  The patient has a wheelchair at home.  Lives with his wife.  The patient's diet is carbohydrate-modified.  CONSULTATIONS:  Gastroenterology, Dr. Lina Sar, [facility], and Dr. Charna Elizabeth covering for [place].  PROCEDURES:  The patient initially had an upper endoscopy performed by Dr. Loreta Ave.  EGD with cold biopsies x2:  Initial report was widely  patent esophagus and gastroesophageal junction, nodular lesion at the GE junction not biopsied, small antral ulcers biopsied x2.  Two submucosal lesions in the antrum  duodenal ulcers.  See description above for details.  The patient had a repeat upper endoscopy performed by Dr. Lina Sar on Dec 07, 2010, which showed irregular Z-line in the distal esophagus, multiple ulcers, normal stomach, resolution of the inflammatory mass, there appears to be fold at the GE junction.  Status post biopsies, rule out Barrett's.  Await biopsy results.  Also, H. pylori was not identified with the Warthin-Starry stain.  FOLLOW UP: 1. The patient should follow up with Dr. [name] within 1 week.     The patient should have a CBC done within 1 week. 2. The patient should follow up with Dr. Lina Sar in 2 to 4 weeks.  BRIEF HISTORY OF PRESENT ILLNESS:  Chief complaint was black stools. This is a 65 year old male with a history of multiple medical problems who presented to the emergency department secondary to black stools for the past 4 days along with crampy lower abdominal pain over the past 24 hours.  The patient also was noted to have a hemoglobin of 9.5, previously the hemoglobin level  was 15.6 on April 15.  HOSPITAL COURSE: 1. GI bleed:  The patient was admitted to the hospital.  The patient     had a blood transfusion.  I believe the patient got blood on Dec 06, 2010.  The patient had upper endoscopy x2 which showed duodenal     ulcers and initially there was concern for a mass in the GE     junction, however, it was thought to be inflammatory mass later.     The patient's H pylori was negative on the Warthin-Starry stain.     The patient will be discharged home on a PPI b.i.d. as well as     Carafate. 2. Hypertension:  The patient's blood pressure was controlled during     the admission. 3. Diabetes mellitus:  The patient was placed on a sliding scale     during the  hospitalization. 4. Coronary artery disease:  The patient was continued on a beta-     blocker.  Also, of note, initially the patient's     aspirin enteric-coated 81 mg was withheld; however, it will be     started on discharge. 5. Back pain:  The patient has a history of back pain which was     stable. 6. For DVT prophylaxis, the patient received SCDs.     Rock Nephew, MD     NH/MEDQ  D:  12/07/2010  T:  12/07/2010  Job:  [id number]  cc:   Gregary Signs A. Everardo All, MD [address] [place] [address]  Hedwig Morton. Juanda Chance, MD [address] [place] [address]  Jyothi Elsie Amis, MD, [facility] Fax: [phone]  Electronically Signed by Rock Nephew MD on 12/15/2010 09:49:40 PM

## 2010-12-15 NOTE — Progress Notes (Signed)
Subjective:    Patient ID: Mark French, male    DOB: 11/09/1945, 65 y.o.   MRN: [id number]  HPI Pt says he has lost weight, due to his efforts.  no cbg record, but states he takes regular insulin very seldom.  He takes nph, 25 units qhs.  He sys cbg in am is approx 100.   Edema: pt says improved.  He has not recently taken the lasix. Pt has few days of mild skin loss on the left leg.  No assoc itching. Past Medical History  Diagnosis Date  . CORONARY ARTERY DISEASE 03/11/2007  . DIABETES MELLITUS, TYPE I 03/11/2007  . HYPERTENSION 03/11/2007  . BLINDNESS 05/21/2008  . Proliferative diabetic retinopathy 03/09/2009  . HYPERCHOLESTEROLEMIA 09/28/2008  . CERVICAL RADICULOPATHY, LEFT 09/23/2008  . ANEMIA-IRON DEFICIENCY 09/23/2008  . DEPRESSION 09/23/2008  . PERIPHERAL NEUROPATHY 09/23/2008  . MYOCARDIAL INFARCTION, HX OF 09/23/2008  . CONGESTIVE HEART FAILURE 09/23/2008  . RENAL INSUFFICIENCY 03/15/2010  . CEREBROVASCULAR ACCIDENT, HX OF 09/23/2008  . 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 Venous insufficiency of leg   . ED (erectile dysfunction)   . Charcot foot due to diabetes mellitus     chronic pain  . Hypogonadism male   . Congestive heart failure     Past Surgical History  Procedure Date  . Coronary artery bypass graft   . Carpal tunnel release     left  . Wrist surgery     s/p right-bone graft fusion  . Spine surgery 2010    C spine surgery for disc Dr. Yetta Barre  . Sigmoidoscopy 03/11/2001  . Venous doppler 01/30/2004    History   Social History  . Marital Status: Married    Spouse Name: N/A    Number of Children: N/A  . Years of Education: N/A   Occupational History  . Disabled    Social History Main Topics  . Smoking status: Never Smoker   . Smokeless tobacco: Not on file  . Alcohol Use: No  . Drug Use: No  . Sexually Active:    Other Topics Concern  . Not on file   Social History Narrative   Comes to appointments with wife     Current Outpatient Prescriptions on File Prior to Visit  Medication Sig Dispense Refill  . aspirin 81 MG tablet Take 81 mg by mouth daily.        . carvedilol (COREG) 3.125 MG tablet Take 3.125 mg by mouth 2 (two) times daily with a meal.        . clotrimazole-betamethasone (LOTRISONE) cream APPLY THREE TIMES DAILY AS NEEDED FOR RASH  45 g  5  . furosemide (LASIX) 40 MG tablet Take 2 tablets once daily       . gabapentin (NEURONTIN) 100 MG capsule Take 100 mg by mouth at bedtime.        [name] HYDROcodone-acetaminophen (NORCO) 10-325 MG per tablet 1/2-1 tablet every 4 hours as needed for pain       . insulin regular (HUMULIN R) 100 UNIT/ML injection Inject 5 Units into the skin 3 (three) times daily before meals.       . Insulin Syringe-Needle U-100 (INSULIN SYRINGE .5CC/31GX5/16") 31G X 5/16" 0.5 ML MISC Four times a day         Allergies  Allergen Reactions  . Sildenafil     Family History  Problem Relation Age of Onset  . Heart disease Mother     Coronary Artery Disease  .  Heart disease Father     Coronary Artery Disease  . Heart disease Paternal Grandmother     Coronary Artery Disease  . Cancer Neg Hx   . Diabetes Other     Grandmother    BP 128/68  Pulse 72  Temp(Src) 97.9 F (36.6 C) (Oral)  Ht 5\' 5"  (1.651 m)  Wt 238 lb (107.956 kg)  BMI 39.61 kg/m2  SpO2 95%    Review of Systems Denies sob Neuropathy pain is slightly worse recently.    Objective:   Physical Exam GENERAL: no distress.  Obese. Legs:  2+ bilat edema.  Left leg:  There is a 2x6 cm area of skin avulsion, but no ulcer.       Lab Results  Component Value Date   WBC 7.0 12/15/2010   HGB 10.8* 12/15/2010   HCT 32.2* 12/15/2010   MCV 86.8 12/15/2010   PLT 254.0 12/15/2010     Assessment & Plan:  Weight loss, apparently due to his efforts Dm, with less insulin requirement due to his weight loss. Anemia, improved. Apparent skin avulsion, new

## 2010-12-15 NOTE — Patient Instructions (Addendum)
Stop nph insulin. Take regular insulin 5 units 3x a day (just before each meal).  You should take this no matter what you blood sugar is. check your blood sugar 2 times a day.  vary the time of day when you check, between before the 3 meals, and at bedtime.  also check if you have symptoms of your blood sugar being too high or too low.  please keep a record of the readings and bring it to your next appointment here.  please call us sooner if you are having low blood sugar episodes. Please make a follow-up appointment in 6 weeks Continue your dietary efforts. keep the broken skin on your left leg covered with antibiotic cream, and bandage. (update: i left message on phone-tree:  rx as we discussed)

## 2010-12-16 LAB — PTH, INTACT AND CALCIUM: PTH: 67.1 pg/mL (ref 14.0–72.0)

## 2010-12-17 NOTE — H&P (Signed)
NAME:  Mark French, Mark French               ACCOUNT NO.:  [id number]  MEDICAL RECORD NO.:  [id number]           PATIENT TYPE:  I  LOCATION:  1407                         FACILITY:  [facility]  PHYSICIAN:  Della Goo, M.D. DATE OF BIRTH:  07-25-1946  DATE OF ADMISSION:  12/03/2010 DATE OF DISCHARGE:                             HISTORY & PHYSICAL   PRIMARY CARE PHYSICIAN:  Sean A. Everardo All, MD  CHIEF COMPLAINT:  Black stools.  HISTORY OF PRESENT ILLNESS:  This is a 65 year old male with multiple medical problems who presents to the emergency department secondary to black stools for the past 4 days along with crampy lower abdominal pain over the past 24 hours.  The patient denies having any nausea or vomiting.  He denies that the pain is achy and crampy, rates the pain as being in 8/10 at the worst.  The patient states that he recently had the flu with GI symptoms.  The patient reports having the 40 pounds weight loss over the past 3 weeks secondary to the flu.  The patient was seen and evaluated in the emergency department by the ER staff.  The patient was found to have maroon heme-positive stool on the rectal examination and his laboratory studies revealed a hemoglobin level of 9.5.  Previously, his hemoglobin levels had been 15.6 on April 15th. The patient was referred for medical admission.  PAST MEDICAL HISTORY: 1. Coronary artery disease, status post coronary artery bypass     grafting x5 vessels. 2. Type 2 diabetes mellitus. 3. Dyslipidemia. 4. Hypertension. 5. Morbid obesity. 6. Osteoarthritis. 7. Remote history of CVA at age 83-month-old. 8. The patient also has chronic venous insufficiency with venous     stasis ulcers of bilateral lower extremities.  PAST SURGICAL HISTORY:  Cervical spine surgery; bilateral cataract surgeries; coronary artery bypass grafting x5 vessels; open reduction and internal fixation, left ankle fracture; left foot fasciotomy secondary to plantar  fasciitis; carpal tunnel release of the left wrist; right wrist fusion with bone grafting from the left hip.  MEDICATIONS:  At this time include carvedilol, ondansetron, Humulin N, oxycodone/APAP, and Hydrocodone/APAP.  ALLERGIES:  No known drug allergies.  SOCIAL HISTORY:  The patient is a nonsmoker, nondrinker.  No history of illicit drug usage.  FAMILY HISTORY:  Positive for coronary artery disease in both parents.  REVIEW OF SYSTEMS:  Pertinent as mentioned above in the HPI.  All other organ systems are negative.  PHYSICAL EXAMINATION FINDINGS:  GENERAL:  This is a morbidly obese 23- year-old Caucasian male who is in no visible discomfort or acute distress. VITAL SIGNS:  Temperature 99.0, blood pressure 151/85, heart rate 80, respirations 20, O2 sats 98% to 100%. HEENT:  Normocephalic, atraumatic.  Pupils equally round, reactive to light.  Extraocular movements are intact.  Funduscopic benign.  There is no scleral icterus; however, there is conjunctival pallor.  Nares are patent bilaterally.  Oropharynx is clear. NECK:  Supple.  Full range of motion.  No thyromegaly, adenopathy, jugular venous distention. CARDIOVASCULAR:  Regular rate and rhythm.  No murmurs, gallops, or rubs appreciated. LUNGS:  Clear to auscultation bilaterally.  No rales, rhonchi,  or wheezes. ABDOMEN:  Positive bowel sounds.  Soft, nontender, nondistended.  No hepatosplenomegaly.  Obese abdomen. EXTREMITIES:  Without cyanosis, clubbing, or edema.  There are chronic venous stasis changes to the prepatellar areas of both lower extremities. NEUROLOGIC:  The patient is alert and oriented x3.  Cranial nerves are intact.  There are no focal deficits on examination otherwise.  LABORATORY STUDIES:  White blood cell count 11.0, hemoglobin 9.5, hematocrit 29.4, platelets 283, neutrophils 75%, lymphocytes 15%. Sodium 134, potassium 3.7, chloride 97, carbon dioxide 29, BUN 13, creatinine 1.02, and glucose 53.   Urinalysis reveals small leukocyte esterase.  Urine microscopic with rare epithelials 11-20 white blood cells, few bacteria, 0-2 red blood cells.  ASSESSMENT:  A 65 year old male being admitted with, 1. Rectal bleeding. 2. Anemia secondary to rectal bleeding. 3. Lower abdominal pain. 4. Type 2 diabetes mellitus, on insulin therapy. 5. Hypertension. 6. Coronary artery disease history. 7. Morbid obesity.  PLAN:  The patient will be admitted to an area for monitoring.  Serial hemoglobin and hematocrit will be performed to evaluate for further decrease in the patient's hemoglobin level.  The patient will be placed on IV Protonix therapy and IV fluids have also been ordered for fluid resuscitation.  Clear liquids have been ordered for diet for now, 2 units of packed red blood cells have been placed on hold in the event the patient needs to be transfused.  The patient's regular medications will be further verified.  Sliding-scale insulin coverage has also been ordered for elevated blood sugars and the hypoglycemic protocol will be followed for low blood sugars.  The patient will also be seen by Gastroenterology for evaluation of the rectal bleeding.  The gastroenterologist on call was Dr. Charna Elizabeth. She will see the patient this morning.  The patient will be placed in SCDs for DVT prophylaxis and the patient is a full code at this time.    Della Goo, M.D.    HJ/MEDQ  D:  12/04/2010  T:  12/04/2010  Job:  [id number]  Electronically Signed by Della Goo M.D. on 12/17/2010 07:48:25 PM

## 2010-12-20 NOTE — Assessment & Plan Note (Signed)
[facility]   NAME:  Mark French, Mark French               ACCOUNT NO.:  [id number]   MEDICAL RECORD NO.:  [id number]      DATE OF BIRTH:  November 05, 1945   PHYSICIAN:  Maxwell Caul, M.D. VISIT DATE:  12/14/2006                                   OFFICE VISIT   Mr. Mark French is a gentleman who is treated here in the early part of 2007  for venous stasis ulcers predominantly.  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  He tells me that since July of 2007, he has had a  recurrent ulceration on the lateral aspect of his first metatarsal head.  This has no pain or episodic drainage.  He would basically keep this  clean, dry and covered.  However, he did not specifically stay off his  feet.  When he thought of coming to the wound center, he stated it would  look like it was healing over; therefore, he deferred this, however,  would open up again and he has finally returned for review of this.  Also, 3 weeks ago, he noted a nontraumatic small wound on the right  anterior medial leg and he is here for review of that as well.   PROBLEM LIST:  1. Diabetic on insulin.  2. Hyperlipidemia.  3. Hypertension.   CURRENT MEDICATIONS:  1. Humalog 20 units t.i.d.  2. Levemir 30 daily.  3. Zetia 10 daily.  4. Benicar 40 daily.  5. Lasix 40 daily.  6. Darvocet p.r.n.   PHYSICAL EXAMINATION:  VITAL SIGNS:  His temperature is 98.2.  Pulse 61.  Respirations 20.  Blood pressure 136/67.  His blood glucose was lasted  tested 97 (2 days ago).  RESPIRATORY:  Clear air entry bilaterally.  CARDIAC:  Heart sounds are normal.  There is no S3.  No increase in  jugular venous pressure and specific evidence of congestive heart  failure.  CIRCULATION:  His peripheral pulses are palpable, but difficult to feel.  His ABIs were done in this clinic, measured on the left at 1.08, on the  right at 1.38.  His capillary refill  time was good bilaterally.   WOUND EXAM:  On the lateral aspect of his left first metatarsal head is  the wound measuring 0.5 x 1.5 x 0.1.  This is a dry wound with a clean  base with granulation present, but there is no epithelization at all.  There was no drainage noted.  No evidence of surrounding infection.  No  tenderness.  On the right medial lower leg is a wound measuring 1.1 x  0.4 x 0.1.  This has the appearance of a recurrent venous stasis wound.  Once again, there was no evidence of infection here.  Both of his legs  had significant changes of chronic venous stasis.  Per discussion with  the patient and his wife, it did not appear that anything had changed in  terms of the appearance of his legs.   IMPRESSION:  Wagner grade 2 diabetic foot wound.  He has a Charcot  deformity and diabetic neuropathy in this foot as well.  We have gone  ahead and applied Prisma hydrogel, placed  and doughnut and a healing  sandal on this wound.  He is to have a CBC and differential and an x-ray  of the left foot to exclude osteomyelitis.  He is to return early next  week to be placed in a total contact cast.  On the right leg, we applied  PrameGel and hydrogel to his venous stasis wound and put him in an Unna  wrap.  It is anticipated that this wound will heal with just simple  measures as described above.   FOLLOWUP:  He will be seen on Monday or Tuesday of next week.  At that  point in time, we should have the x-ray and lab work back to determine  whether there is a possibility of underlying osteomyelitis.  If not, I  think he can go ahead and be put in a total contact cast.           ______________________________  Maxwell Caul, M.D.     MGR/MEDQ  D:  12/14/2006  T:  12/14/2006  Job:  [id number]   cc:   Gregary Signs A. Everardo All, MD

## 2010-12-20 NOTE — Assessment & Plan Note (Signed)
[facility]   NAME:  Mark French, Mark French               ACCOUNT NO.:  [id number]   MEDICAL RECORD NO.:  [id number]      DATE OF BIRTH:  March 09, 1946   PHYSICIAN:  Maxwell Caul, M.D.      VISIT DATE:                                   OFFICE VISIT   [name] is a gentleman who was seen previously in this clinic, but  most recently seen last week by Dr. Cheryll Cockayne.  He has significant venous  stasis disease and had bilateral ulcerations, 1 on the left lateral  ankle just below the lateral malleolus and the other on the right calf  laterally.  He was placed an Unna wraps.  He had also had some falls and  had bilateral excoriations over his knees, which he has been applying  Neosporin and a nonstick pads.   In discussing things with the patient and his wife, it would appear that  he is felt to have cervical spinal stenosis and is fairly urgent per  their description appointment tomorrow with Neurosurgery.  They are not  expecting to wait long to know whether he is to have cervical spine  surgery.   On examination, he is afebrile.  The left lateral ankle was debrided in  a non-excisional fashion of some surface eschar.  Underneath this, the  wound does not look unhealthy.  There is epithelialization.  The area on  the right lateral lower extremity is essentially healed over and I am  declaring this resolved.   IMPRESSION:  His wound care issues appear to be satisfactory.  I have  returned him to the Unna wraps bilaterally.  To the area on the left  lateral malleolus, we applied Puracol.  It is not clear that he will be  available for followup in the short-term, as he is going to be  hospitalized for potential surgery.  Other than that I think, we would  need to see him again in 7-10 days.  He has had graded pressure  stockings in the past, although between his wife's left hand disability  and his bilateral upper extremity disability it seems almost impossible  for them  to apply them.           ______________________________  Maxwell Caul, M.D.     MGR/MEDQ  D:  10/22/2008  T:  10/23/2008  Job:  [id number]

## 2010-12-20 NOTE — Op Note (Signed)
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   PROCEDURES:  1. Decompressive anterior cervical diskectomy at C3-4 and C4-5 and      with anterior cervical arthrodesis at C3-4 and C4-5 utilizing 8-mm      corticocancellous allografts.  2. Anterior cervical plating C3-C5 inclusive utilizing the Orthofix      reliant plate.   SURGEON:  Tia Alert, MD   ASSISTANT:  Kathaleen Maser. Pool, MD   ANESTHESIA:  General endotracheal.   COMPLICATIONS:  None apparent.   INDICATIONS FOR PROCEDURE:  Mr. Abruzzo is a 65 year old gentleman who  presents with progressive weakness and numbness in the hands with  instability of gait to the point that he is now using a walker to be  hyperreflexic with positive Hoffmann sign.  MRI showed severe stenosis  at C3-4 and C4-5 with signal change in the spinal cord at C3-4.  I  recommended decompressive anterior cervical diskectomy fusion plating at  C3-4 and C4-5.  He understood the risks, benefits, suspect outcome and  wished to proceed.   DESCRIPTION OF PROCEDURE:  The patient was taken to the operating room.  After induction of adequate generalized endotracheal anesthesia, he was  placed in a supine position on the operating room table.  His right  anterior cervical region was prepped with DuraPrep and then draped in  the usual sterile fashion.  A 5 mL of local anesthesia was injected and  a transverse incision was made to the right of midline  and carried down  to the platysma, which was elevated, opened, and then undermined with  Metzenbaum scissors.  I then dissected a plane medial to the  sternocleidomastoid muscle and internal carotid artery, and lateral to  the trachea and esophagus to expose C3-4 and C4-5.  Intraoperative  fluoroscopy confirmed my level and then I took down the longus colli  muscles and placed the shadow line retractors under this.  The annulus  was incised at both levels and initial diskectomies was done with  pituitary rongeurs and curved curettes.  I then used the high-speed  drill to drill the endplates to prepare for later arthrodesis.  We  drilled to a disk space height of 8 mm drilling in a rectangular  fashion.  We then brought in the operating microscope.  We opened the  posterior longitudinal ligament and removed by undercutting the bodies  of C3-4 and C4-5 at each level.  We angled the scope up and down to  undercut the vertebral bodies.  Bilateral foraminotomies were performed.  Once this was  done, we could see that the dura was no longer pushed away  from [name], but was full and capacious all the way across.  We palpated  over the nerve hook in a circumferential fashion to assure adequate  decompression.  Therefore, by both palpation and visualization as best  we could tell, we had adequate decompression of the central canal at C3-  4 and C4-5.  We then measured interspaces to be 8 mm and tapped 8-mm  corticocancellous allografts into the interspaces at the C3-4, C4-5, and  then used a 44 mm Orthofix reliant plate and placed two 14-mm variable  angle screws in the bodies of C3, C4, C5, and then locked these into  plate with the locking mechanism.  We then irrigated with saline  solution containing bacitracin, dried all bleeding points with a surgery  foam with bipolar cautery, and once meticulous hemostasis was achieved,  I closed the platysma with 3-0 Vicryl, closed the subcuticular tissue   with 3-0 Vicryl, and closed the skin with Benzoin Steri-Strips.  The  drapes were removed.  Sterile dressing was applied.  The patient was  awakened from general anesthesia and transferred to the recovery room in  stable condition.  At the end of the procedure all sponge, needle, and  instrument counts were correct.      Tia Alert, MD  Electronically Signed     DSJ/MEDQ  D:  10/30/2008  T:  10/31/2008  Job:  [id number]

## 2010-12-20 NOTE — Assessment & Plan Note (Signed)
[facility]   NAME:  Mark French, Mark French               ACCOUNT NO.:  [id number]   MEDICAL RECORD NO.:  [id number]      DATE OF BIRTH:  20-Oct-1945   PHYSICIAN:  Theresia Majors. Tanda Rockers, M.D. VISIT DATE:  09/26/2007                                   OFFICE VISIT   SUBJECTIVE:  Mark French is a 65 year old man who we have followed for  bilateral stasis ulcerations.  In the interim he has worn bilateral  Profore's with triamcinolone ointment.  He discontinued his wraps  yesterday because they were too taught.  There has been no interim  fever.   OBJECTIVE:  VITALS:  Blood pressure is 149/65, respirations 18, pulse  rate 61, temperature 97.8.  EXTREMITIES:  Inspection of the lower extremities shows that his edema  is essentially decreased but still present.  The previous blisters and  ulcerations have resolved.  There remains hyperemia with brisk capillary  refill.   ASSESSMENT:  Stasis.   PLAN:  We will return the patient to compression wraps.  We have  instructed him to bring his compressive hose with him during his next  visit.  He is to continue the use of his p.o. diuretics as has been  described by [name].  We anticipate that he will make a transition  to compressive hose during his next visit.      [name], M.D.  Electronically Signed     HAN/MEDQ  D:  09/26/2007  T:  09/27/2007  Job:  [id number]

## 2010-12-20 NOTE — Assessment & Plan Note (Signed)
[facility]   NAME:  Mark French, Mark French               ACCOUNT NO.:  [id number]   MEDICAL RECORD NO.:  [id number]      DATE OF BIRTH:  1945/11/14   PHYSICIAN:  Theresia Majors. Tanda Rockers, M.D. VISIT DATE:  09/19/2007                                   OFFICE VISIT   SUBJECTIVE:  Mark French is a 65 year old man who was last seen in the  wound center in July 2008.  At that time we successfully treated him for  stasis ulcers utilizing an OASIS protocol.  Since then he has been well.  He experienced a recurrence of an ulceration approximately a month ago  and it was managed by his wife with topical ointment as well as Ace  bandages.  Over the last 2 weeks he has had ulcerations involving the  left lower extremity and these have not responded.  There has been no  interim fever.  There have been no changes in his medication.  He  continues to respond well to diuretic therapy.   OBJECTIVE:  Blood pressure is 148/68, respirations 20, pulse rate 84,  temperature 97.9.  Capillary blood glucose is 110 mg%.  Inspection of the lower extremities shows that there are multiple  superficial ulcerations associated with +3 edema.  The wounds were  numbered #7, 8 and 9, measured and cataloged.  Please refer to the data  entries.  Both feet are warm symmetrically but they are not feverish.  Capillary refill is brisk.   ASSESSMENT:  Stasis ulceration with fluid retention.   PLAN:  We placed the patient in bilateral compression wraps with  triamcinolone ointment.  We will reevaluate him in 1 week.  He has been  instructed to keep all of his appointments with his primary care  physician, Dr. Melvyn Novas, and we will see him in 1 week.      [name], M.D.  Electronically Signed     HAN/MEDQ  D:  09/19/2007  T:  09/21/2007  Job:  [id number]

## 2010-12-20 NOTE — Consult Note (Signed)
NAME:  Mark French, Mark French               ACCOUNT NO.:  [id number]   MEDICAL RECORD NO.:  [id number]          PATIENT TYPE:  REC   LOCATION:  FOOT                         FACILITY:  [facility]   PHYSICIAN:  Jonelle Sports. Sevier, M.D. DATE OF BIRTH:  October 12, 1945   DATE OF CONSULTATION:  10/14/2008  DATE OF DISCHARGE:                                 CONSULTATION   HISTORY:  This 65 year old white male is known to this clinic  previously.  He has chronic venous insufficiency in the lower  extremities and has had repeated stasis ulcerations on that basis.  He  does have type 2 diabetes, which is only under fair control but  remarkably has pretty sound arterial circulation in both lower  extremities.   Once we had gotten him healed here, his wife has tended to manage him at  home with sort of a Kerlix/Coban wrap arrangement being unable to  satisfactorily get compression hose on and off him.  He does some time  to time have superficial venous ulcers but nothing that she has felt  compelled to bring him back here for.   Recently, he has begun to have some neurologic difficulties with extreme  weakness in his extremities and there is concern about some spinal cord  compression or other neurologic disorder, and he plans an MRI.   Because of that latter infirmity, he had a fall at home several days ago  and avulsed the nail from his hallux on the left side and also sustained  several abrasion-type wounds on both elbows and the right knee and the  lateral left ankle.   It is for all of these reasons in combination that he is brought here  today for our repeat evaluation and advice.   His past medical history is not recorded in detail in the present chart  but has been in the past and is largely noncontributory except for those  things mentioned above in the present illness.   He has no known medicinal allergies.   REGULAR MEDICATIONS:  1. Benicar 40 mg 1 daily.  2. Aspirin 81 mg daily.  3.  Furosemide 40 mg 2-3 tablets per day.  4. Vicodin 5/500 one p.o. daily.  5. Humulin N and R insulin, taken 30 units once daily in the morning      and/or 30 units 3 times daily at meals.   He is unable now to walk without assistance.  He uses a walker and a  mobility scooter.  He is married and lives at home with his wife, who is  his principal caregiver.  He does not smoke or use alcoholic beverages.   REVIEW OF SYSTEMS:  As stated above, he does have some mild hypertension  and had coronary bypass surgery in 1998.  He has no recognized chronic  pulmonary disease.  There was some question that he had a stroke 2  months ago but that is confused by these other recent symptoms in the  extremities that do not exactly fit a stroke pattern.   PHYSICAL EXAMINATION:  VITAL SIGNS:  Blood pressure is 149/75, pulse is  74, respirations 19, temperature 98.4.  GENERAL:  The patient is obese, alert, cooperative, in no immediate  distress.  He shows some involuntary twitching movements in his lower  extremities.  EXTREMITIES:  He has no gross deformities and no evidence for fractures  and has partial thickness skin avulsion-type abrasions on both forearms,  near the elbows on the extensor surfaces.  He has multiple scattered  superficial ulcerations about both lower extremities and has trophedema  to the knees bilaterally and extreme erythema and warmth involving most  of his anterior tibial areas bilaterally.  He has 3 avulsion-type skin  lesions at the right knee, another on his left lateral foot near the  malleolus, and he indeed has completely avulsed his nail of his left  hallux.  Interestingly, the left hallux appears stable and as if it will  heal without any particular difficulty.   Handheld Doppler studies are done, and he has biphasic pulses at both  locations in the right lower extremity, at the posterior tibial on the  left lower extremity, and a somewhat monophasic signal at the  dorsalis  pedis in the left lower extremity.   IMPRESSION:  Chronic venous insufficiency with bilateral lower extremity  ulcerations on this basis.   Recent traumatic avulsion of the nail of left hallux.   Traumatically induced abrasions of the right knee and both elbows.   DISPOSITION:  Several of the wounds where there is evidence of dried  blood accumulation are selectively debrided of this and this is well  tolerated.   The open areas that will be covered in his lower extremity wraps are  not, otherwise, treated but those that will not, such as on his elbows  and knees, will be treated with applications of Neosporin and covered  with a dry dressing.   Both lower extremities will be placed in Unna wraps, and it is my  anticipation that these lower extremities will quickly clean up with  this and that the erythema that we are seeing in the legs will abate  fairly quickly as well.   The patient will proceed with his other evaluations as per [name]  and the Neurology consultants, and he will be seen here in return visit  in 8 or 9 days.           ______________________________  Jonelle Sports Cheryll Cockayne, M.D.     RES/MEDQ  D:  10/14/2008  T:  10/15/2008  Job:  [id number]   cc:   Gregary Signs A. Everardo All, MD

## 2010-12-23 NOTE — Op Note (Signed)
NAME:  Mark French, Mark French               ACCOUNT NO.:  [id number]   MEDICAL RECORD NO.:  [id number]          PATIENT TYPE:  AMB   LOCATION:  DAY                          FACILITY:  [facility]   PHYSICIAN:  Leonides Grills, M.D.     DATE OF BIRTH:  [birth year]   DATE OF PROCEDURE:  11/18/2005  DATE OF DISCHARGE:                                 OPERATIVE REPORT   PREOPERATIVE DIAGNOSES:  1.  Left plantar cuboid spur.  2.  Left metatarsal plantar spur.  3.  Left gastroc.   POSTOPERATIVE DIAGNOSES:  1.  Left plantar cuboid spur.  2.  Left metatarsal plantar spur.  3.  Left gastroc.   OPERATION:  1.  Partial excision, left cuboid.  2.  Partial excision, left metatarsal.  3.  Stress x-rays of the left foot.  4.  Left gastroc__________.   ANESTHESIA:  General.   SURGEON:  Leonides Grills, MD.   ASSISTANT:  Lianne Cure, PA-C.   ESTIMATED BLOOD LOSS:  Minimal.   TOURNIQUET:  None.   COMPLICATIONS:  None.   DISPOSITION:  Stable to the PR.   INDICATIONS FOR PROCEDURE:  This is a 65 year old gentleman, who has  diabetes with peripheral neuropathy.  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  He was consented to receive the above-procedure,  all risks, which include infection, nerve or vessel injury, persistent  ulceration, and Charcot arthropathy following recovery, bleeding, were all  explained, questions were encouraged and answered.   OPERATIVE NOTE:  The patient was brought to the operating room and placed in  the supine position.  After general endotracheal tube anesthesia was  administered as well as Ancef 1 gm IV piggyback, the left lower extremity  was then prepped and draped in a sterile manner.  No tourniquet was used.  A  longitudinal incision over the medial aspect of the gastrocnemius muscle-  tendinous junction was then made, dissection was carried out through skin,  hemostasis  was obtained.  The fascia was opened in line with the incision.  A conjoined region was then developed between the gastroc soleus, and soft  tissue was then elevated off of the posterior aspect of the gastrocnemius.  With retraction applied, the gastrocnemius then released with a curved Mayo  scissors,__________ outstanding and release of tight gastroc.  The area was  copiously irrigated with normal saline.  The subcu was closed with 3-0  Vicryl, the skin was closed with a 4-0 nylon stitch.  We then made a  longitudinal incision over the plantar aspect of the left cuboid and  metatarsal area, dissection was carried down full thickness directly down to  bone, hemostasis was obtained.  The soft tissue was then elevated off the  plantar aspect of the prominent cuboid as well as base of the fifth  metatarsal.  With a curved half-inch osteotome, this was then chiseled off  and removed with a rongeur.  There was no pulsatile bleeding.  We then  obtained a series of stress x-rays that showed decompression of  the plantar  aspect of his foot adequately.  The area was copiously irrigated with normal  saline.  The subcu was closed with 2-0 Vicryl, the skin was closed with 4-0  nylon, a sterile dressing was applied, a Jones dressing was applied.  The  patient was stable to the PR.      Leonides Grills, M.D.  Electronically Signed     PB/MEDQ  D:  11/18/2005  T:  11/19/2005  Job:  [id number]

## 2010-12-23 NOTE — Assessment & Plan Note (Signed)
[facility]   NAME:  Mark French, Mark French               ACCOUNT NO.:  [id number]   MEDICAL RECORD NO.:  [id number]      DATE OF BIRTH:  Aug 28, 1945   PHYSICIAN:  Theresia Majors. Tanda Rockers, M.D. VISIT DATE:  01/08/2007                                   OFFICE VISIT   SUBJECTIVE:  Mark French is a 66 year old man who we follow for a Wagner  grade 2 diabetic foot ulcer.  In the interim we have treated him with a  healing sandal and a bulky dressing.  He has continued to use antiseptic  soap washes.  The patient reports that he noticed blisters on the  anterior shin of his left lower extremity, one spontaneously ruptured 24  hours ago.  There has been no recognizable thermal trauma, however.   OBJECTIVE:  The patient is accompanied by his wife with blood pressure  130/68, respirations 18, pulse rate 68, temperature is 98.3, capillary  blood glucose is 109 mg%.   EXAMINATION:  Inspection of the left lower extremity shows that there  has been reasonable compression it manifested by lineal wrinkles in the  skin.  There are areas of second degree blisters, which have  spontaneously ruptured on the anterolateral aspect of the foot.  There  is no evidence of acute infection.  There is no cellulitis. There is no  evidence of ischemia.  These 2 areas were copiously irrigated and an  oasis dressing applied per protocol.  These wounds were photographed and  cataloged.  Wound #4 on the left foot shows dramatic improvement with  decrease in the area.  There is 100% granulation and no evidence of  infection.  Wound #5 on the medial lower extremity is completely  resolved.   ASSESSMENT:  Improved Wagner grade 2 diabetic foot ulcer with new second  degree blister formation as described above.   PLAN:  We will continue oasis protocol.  In addition, we will add  Iodosorb gel to the wound #4.  We will reevaluate the patient in 3 days  to assess his response.      [name], M.D.  Electronically Signed     HAN/MEDQ  D:  01/08/2007  T:  01/08/2007  Job:  [id number]
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
## 2010-12-23 NOTE — Assessment & Plan Note (Signed)
[facility]   NAME:  Mark French, Mark French               ACCOUNT NO.:  [id number]   MEDICAL RECORD NO.:  [id number]      DATE OF BIRTH:  17-Feb-1946   PHYSICIAN:  Theresia Majors. Tanda Rockers, M.D. VISIT DATE:  12/25/2006                                   OFFICE VISIT   SUBJECTIVE:  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  In addition he had a wound of the right foot that was similarly managed.  In the interim he has been treated with off-loading sandal, a bulking  dressing and antiseptic soap washes.  He reports that there has been no  excessive drainage, no malodor, and no fever.   OBJECTIVE:  Blood pressures 148/93, respirations 20, pulse rate 68,  temperature 98.1, capillary blood glucose is 132 mg%.   EXAMINATION:  Inspection of the foot shows that the ulceration of wound  #4 is clean, there is no evidence of infection, there is 100%  granulation.  The measurements were taken.  The wound was photographed  and cataloged.  Wound #5 has resolved.  There is no evidence of ischemia  or active infection.   ASSESSMENT:  Clinical improvement.   PLAN:  We will continue the offloading healing sandal with a bulky  dressing as well as the daily antiseptic soap wash.  We will reevaluate  the patient in 2 weeks.      [name], M.D.  Electronically Signed     HAN/MEDQ  D:  12/25/2006  T:  12/25/2006  Job:  [id number]
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
## 2010-12-28 ENCOUNTER — Encounter: Payer: Self-pay | Admitting: Internal Medicine

## 2011-01-29 ENCOUNTER — Other Ambulatory Visit: Payer: Self-pay | Admitting: Endocrinology

## 2011-01-30 NOTE — Telephone Encounter (Signed)
i refilled F/u ov is due

## 2011-01-30 NOTE — Telephone Encounter (Signed)
Rx faxed to [facility]. Pt states his wife will callback to schedule a F/U OV.

## 2011-01-30 NOTE — Telephone Encounter (Signed)
Please advise 

## 2011-02-10 ENCOUNTER — Other Ambulatory Visit (INDEPENDENT_AMBULATORY_CARE_PROVIDER_SITE_OTHER): Payer: Medicare Other

## 2011-02-10 ENCOUNTER — Ambulatory Visit (INDEPENDENT_AMBULATORY_CARE_PROVIDER_SITE_OTHER): Payer: Medicare Other | Admitting: Endocrinology

## 2011-02-10 ENCOUNTER — Encounter: Payer: Self-pay | Admitting: Endocrinology

## 2011-02-10 VITALS — BP 118/72 | HR 68 | Temp 97.7°F | Ht 68.0 in | Wt 230.0 lb

## 2011-02-10 DIAGNOSIS — E109 Type 1 diabetes mellitus without complications: Secondary | ICD-10-CM

## 2011-02-10 DIAGNOSIS — R609 Edema, unspecified: Secondary | ICD-10-CM

## 2011-02-10 DIAGNOSIS — R0989 Other specified symptoms and signs involving the circulatory and respiratory systems: Secondary | ICD-10-CM

## 2011-02-10 LAB — BASIC METABOLIC PANEL
BUN: 16 mg/dL (ref 6–23)
Calcium: 8.9 mg/dL (ref 8.4–10.5)
Creatinine, Ser: 1.1 mg/dL (ref 0.4–1.5)
GFR: 71.63 mL/min (ref >60.00)
Glucose, Bld: 121 mg/dL — ABNORMAL HIGH (ref 70–99)

## 2011-02-10 MED ORDER — GABAPENTIN 300 MG PO CAPS: 300.0000 mg | ORAL_CAPSULE | Freq: Every day | ORAL | Status: DC

## 2011-02-10 MED ORDER — HYDROCODONE-ACETAMINOPHEN 10-325 MG PO TABS: 1.0000 | ORAL_TABLET | ORAL | Status: DC | PRN

## 2011-02-10 NOTE — Patient Instructions (Addendum)
blood tests are being ordered for you today.  please call [phone] to hear your test results.  You will be prompted to enter the 9-digit "MRN" number that appears at the top left of this page, followed by #.  Then you will hear the message. check your blood sugar 2 times a day.  vary the time of day when you check, between before the 3 meals, and at bedtime.  also check if you have symptoms of your blood sugar being too high or too low.  please keep a record of the readings and bring it to your next appointment here.  please call us sooner if you are having low blood sugar episodes. Please make a regular physical appointment in 3 months.   Keep the ulcer areas covered with antibiotic ointment, and large bandaids.  Call if any of these get worse. Increase gabapentin to 300 mg at bedtime (update: i left message on phone-tree:  i advised change back to reg insulin.  If you choose to stay on nph, reduce to 15 units.  You do not need increased diuretic rx.)

## 2011-02-10 NOTE — Progress Notes (Signed)
Subjective:    Patient ID: Mark French, male    DOB: 04-22-1946, 65 y.o.   MRN: [id number]  HPI Pt did not change nph to reg insulin.  he brings a record of his cbg's which i have reviewed today.  It varies from 96-170.  It is in general higher as the day goes on, but not necessarily so.   He has a few mos of slight ulcers on the legs, and assoc edema.   Chronic leg pain persists. Past Medical History  Diagnosis Date  . CORONARY ARTERY DISEASE 03/11/2007  . DIABETES MELLITUS, TYPE I 03/11/2007  . HYPERTENSION 03/11/2007  . BLINDNESS 05/21/2008  . Proliferative diabetic retinopathy 03/09/2009  . HYPERCHOLESTEROLEMIA 09/28/2008  . CERVICAL RADICULOPATHY, LEFT 09/23/2008  . ANEMIA-IRON DEFICIENCY 09/23/2008  . DEPRESSION 09/23/2008  . PERIPHERAL NEUROPATHY 09/23/2008  . MYOCARDIAL INFARCTION, HX OF 09/23/2008  . CONGESTIVE HEART FAILURE 09/23/2008  . RENAL INSUFFICIENCY 03/15/2010  . CEREBROVASCULAR ACCIDENT, HX OF 09/23/2008  . 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 Venous insufficiency of leg   . ED (erectile dysfunction)   . Charcot foot due to diabetes mellitus     chronic pain  . Hypogonadism male   . Congestive heart failure     Past Surgical History  Procedure Date  . Coronary artery bypass graft   . Carpal tunnel release     left  . Wrist surgery     s/p right-bone graft fusion  . Spine surgery 2010    C spine surgery for disc Dr. Yetta Barre  . Sigmoidoscopy 03/11/2001  . Venous doppler 01/30/2004    History   Social History  . Marital Status: Married    Spouse Name: N/A    Number of Children: N/A  . Years of Education: N/A   Occupational History  . Disabled    Social History Main Topics  . Smoking status: Never Smoker   . Smokeless tobacco: Not on file  . Alcohol Use: No  . Drug Use: No  . Sexually Active:    Other Topics Concern  . Not on file   Social History Narrative   Comes to appointments with wife    Current Outpatient  Prescriptions on File Prior to Visit  Medication Sig Dispense Refill  . aspirin 81 MG tablet Take 81 mg by mouth daily.        . carvedilol (COREG) 3.125 MG tablet Take 3.125 mg by mouth 2 (two) times daily with a meal.        . clotrimazole-betamethasone (LOTRISONE) cream APPLY THREE TIMES DAILY AS NEEDED FOR RASH  45 g  5  . furosemide (LASIX) 40 MG tablet Take 2 tablets once daily       . gabapentin (NEURONTIN) 100 MG capsule Take 100 mg by mouth at bedtime.        [name] HYDROcodone-acetaminophen (NORCO) 10-325 MG per tablet TAKE 1/2 TO 1 TABLET BY MOUTH EVERY 4 HOURS AS NEEDED FOR PAIN  90 tablet  0  . insulin regular (HUMULIN R) 100 UNIT/ML injection Inject 5 Units into the skin 3 (three) times daily before meals.       . Insulin Syringe-Needle U-100 (INSULIN SYRINGE .5CC/31GX5/16") 31G X 5/16" 0.5 ML MISC Four times a day       . Docusate Sodium (STOOL SOFTENER) 100 MG capsule Take 100 mg by mouth daily.        [name] DISCONTD: Ferrous Sulfate (IRON) 325 (65 FE) MG TABS Take 1 tablet  by mouth 3 (three) times daily.        [name] DISCONTD: ondansetron (ZOFRAN) 8 MG tablet Take by mouth 2 (two) times daily as needed.        [name] DISCONTD: pantoprazole (PROTONIX) 40 MG tablet Take 40 mg by mouth 2 (two) times daily.          Allergies  Allergen Reactions  . Sildenafil     Family History  Problem Relation Age of Onset  . Heart disease Mother     Coronary Artery Disease  . Heart disease Father     Coronary Artery Disease  . Heart disease Paternal Grandmother     Coronary Artery Disease  . Cancer Neg Hx   . Diabetes Other     Grandmother    BP 118/72  Pulse 68  Temp(Src) 97.7 F (36.5 C) (Oral)  Ht 5\' 8"  (1.727 m)  Wt 230 lb (104.327 kg)  BMI 34.97 kg/m2  SpO2 96%    Review of Systems  Constitutional: Negative for unexpected weight change.  Respiratory: Negative for shortness of breath.   Cardiovascular: Negative for chest pain.  Gastrointestinal: Negative for vomiting.    Genitourinary: Negative for frequency.  Skin:       Denies excessive diaphoresis  Neurological: Negative for syncope.  Psychiatric/Behavioral: Negative for dysphoric mood.   denies hypoglycemia and fever.     Objective:   Physical Exam Pulses: dorsalis pedis intact bilat.   Feet: no deformity.  no ulcer on the feet.  feet are of normal color and temp.  1+ bilat leg edema.  There are several shallow ulcers on both ant tibial areas.  There is bilteral onychomycosis.  There is an abrasion on the right great toe.  Neuro: sensation is intact to touch on the feet, but decreased from normal.    Lab Results  Component Value Date   HGBA1C 6.7* 02/10/2011  bnp=normal Assessment & Plan:  DM: overcontrolled, given this regimen, which does match insulin to his changing needs throughout the day. Leg ulcers, worse. Neuropathic pain, needs increased rx Edema.  In view of normal bnp, this does not represent overall fluid overload.

## 2011-02-13 ENCOUNTER — Encounter: Payer: Self-pay | Admitting: Internal Medicine

## 2011-02-14 ENCOUNTER — Other Ambulatory Visit: Payer: Self-pay | Admitting: Endocrinology

## 2011-02-16 ENCOUNTER — Other Ambulatory Visit (INDEPENDENT_AMBULATORY_CARE_PROVIDER_SITE_OTHER): Payer: Medicare Other

## 2011-02-16 ENCOUNTER — Encounter: Payer: Self-pay | Admitting: Internal Medicine

## 2011-02-16 ENCOUNTER — Ambulatory Visit (INDEPENDENT_AMBULATORY_CARE_PROVIDER_SITE_OTHER): Payer: Medicare Other | Admitting: Internal Medicine

## 2011-02-16 DIAGNOSIS — K260 Acute duodenal ulcer with hemorrhage: Secondary | ICD-10-CM

## 2011-02-16 DIAGNOSIS — D509 Iron deficiency anemia, unspecified: Secondary | ICD-10-CM

## 2011-02-16 DIAGNOSIS — K269 Duodenal ulcer, unspecified as acute or chronic, without hemorrhage or perforation: Secondary | ICD-10-CM

## 2011-02-16 LAB — CBC WITH DIFFERENTIAL/PLATELET
Basophils Absolute: 0 10*3/uL (ref 0.0–0.1)
Basophils Relative: 0.5 % (ref 0.0–3.0)
Eosinophils Absolute: 0.2 10*3/uL (ref 0.0–0.7)
Eosinophils Relative: 3 % (ref 0.0–5.0)
HCT: 39 % (ref 39.0–52.0)
Hemoglobin: 13 g/dL (ref 13.0–17.0)
Lymphocytes Relative: 21.2 % (ref 12.0–46.0)
Lymphs Abs: 1.8 10*3/uL (ref 0.7–4.0)
MCHC: 33.3 g/dL (ref 30.0–36.0)
MCV: 82.3 fl (ref 78.0–100.0)
Monocytes Absolute: 0.6 10*3/uL (ref 0.1–1.0)
Monocytes Relative: 7.5 % (ref 3.0–12.0)
Neutro Abs: 5.7 10*3/uL (ref 1.4–7.7)
Neutrophils Relative %: 67.8 % (ref 43.0–77.0)
Platelets: 246 10*3/uL (ref 150.0–400.0)
RBC: 4.73 Mil/uL (ref 4.22–5.81)
RDW: 15.2 % — ABNORMAL HIGH (ref 11.5–14.6)
WBC: 8.4 10*3/uL (ref 4.5–10.5)

## 2011-02-16 LAB — IBC PANEL: Iron: 30 ug/dL — ABNORMAL LOW (ref 42–165)

## 2011-02-16 MED ORDER — OMEPRAZOLE 20 MG PO CPDR: 20.0000 mg | DELAYED_RELEASE_CAPSULE | Freq: Every day | ORAL | Status: DC

## 2011-02-16 NOTE — Progress Notes (Signed)
ACELIN FERDIG 06/19/1946 MRN [id number]      History of Present Illness:  This is a 65 year old white male who is status post upper GI bleed from a duodenal ulcer in May 2012 requiring hospitalization. An upper endoscopy confirmed the presence of a duodenal ulcer although the etiology is not clear. He was H. pylori negative. He is taking aspirin 81 mg a day, no NSAIDs. He discontinued his proton pump inhibitor 4 weeks ago. Patient denies abdominal pain, black stools, nausea or vomiting . Additional medical problems are coronary artery disease, diabetes mellitus, high blood pressure, blindness, congestive heart failure and history of CVA.   Past Medical History  Diagnosis Date  . CORONARY ARTERY DISEASE 03/11/2007  . DIABETES MELLITUS, TYPE I 03/11/2007  . HYPERTENSION 03/11/2007  . BLINDNESS 05/21/2008  . Proliferative diabetic retinopathy 03/09/2009  . HYPERCHOLESTEROLEMIA 09/28/2008  . CERVICAL RADICULOPATHY, LEFT 09/23/2008  . ANEMIA-IRON DEFICIENCY 09/23/2008  . DEPRESSION 09/23/2008  . PERIPHERAL NEUROPATHY 09/23/2008  . MYOCARDIAL INFARCTION, HX OF 09/23/2008  . CONGESTIVE HEART FAILURE 09/23/2008  . RENAL INSUFFICIENCY 03/15/2010  . CEREBROVASCULAR ACCIDENT, HX OF 09/23/2008  . 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 Venous insufficiency of leg   . ED (erectile dysfunction)   . Charcot foot due to diabetes mellitus     chronic pain  . Hypogonadism male   . Congestive heart failure   . Duodenal ulcer 2012    multiple  . GI bleed   . Osteoarthritis    Past Surgical History  Procedure Date  . Coronary artery bypass graft   . Carpal tunnel release     left  . Wrist surgery     s/p right-bone graft fusion  . Spine surgery 2010    C spine surgery for disc Dr. Yetta Barre  . Sigmoidoscopy 03/11/2001  . Venous doppler 01/30/2004  . Cataract surgery   . Ankle surgery     left  . Foot surgery     left    reports that he has never smoked. He has never used  smokeless tobacco. He reports that he does not drink alcohol or use illicit drugs. family history includes Diabetes in his other and Heart disease in his father, mother, and paternal grandmother.  There is no history of Colon cancer. Allergies  Allergen Reactions  . Sildenafil         Review of Systems: Denies abdominal pain, dysphagia odynophagia shortness of breath or chest pain  The remainder of the 10  point ROS is negative except as outlined in H&P   Physical Exam: General appearance  Well developed in no distress, overweight. Eyes- non icteric, blind. HEENT nontraumatic, normocephalic. Mouth no lesions, tongue papillated, no cheilosis. Neck supple without adenopathy, thyroid not enlarged, no carotid bruits, no JVD. Lungs Clear to auscultation bilaterally. Cor normal S1 normal S2, regular rhythm , no murmur,  quiet precordium. Abdomen obese protuberant, soft. Nontender. Normoactive bowel sounds Rectal: Performed Hemoccult negative stool. Extremities no pedal edema. Skin no lesions. Neurological alert and oriented x 3. Psychological normal mood and affect.  Assessment and Plan:  Problem #1 Status post upper GI bleed secondary to a duodenal ulcer. Patient is doing well. We will need to recheck his CBC and iron studies today and he should have a screening colonoscopy at some point. We will start him on omeprazole 20 mg daily. He is to continue aspirin 81 mg a day.   02/16/2011 Lina Sar

## 2011-02-16 NOTE — Patient Instructions (Addendum)
We have sent the following medications to your pharmacy for you to pick up at your convenience: Omeprazole 20 mg daily. Your physician has requested that you go to the basement for the following lab work before leaving today: CBC, IBC Dr Everardo All

## 2011-02-17 ENCOUNTER — Telehealth: Payer: Self-pay | Admitting: *Deleted

## 2011-02-17 NOTE — Telephone Encounter (Signed)
Message copied by Daphine Deutscher on Fri Feb 17, 2011  9:44 AM ------      Message from: Hart Carwin      Created: Thu Feb 16, 2011  5:50 PM       Please call pt with normal CBC, no need to take Iron ( he stopped it anyway).

## 2011-02-17 NOTE — Telephone Encounter (Signed)
Patient notified of lab result as per Dr. Juanda Chance

## 2011-02-20 ENCOUNTER — Telehealth: Payer: Self-pay | Admitting: Internal Medicine

## 2011-02-20 ENCOUNTER — Other Ambulatory Visit: Payer: Self-pay | Admitting: Endocrinology

## 2011-02-20 ENCOUNTER — Telehealth: Payer: Self-pay | Admitting: *Deleted

## 2011-02-20 MED ORDER — HYDROCODONE-ACETAMINOPHEN 10-325 MG PO TABS: 1.0000 | ORAL_TABLET | ORAL | Status: DC | PRN

## 2011-02-20 NOTE — Telephone Encounter (Signed)
Patient calling to report that when he got on and off the exam table at [facility] on 02/16/11, he hurt his back and he has not slept much this week. States he gets his pain medication from [name] but he is not due for a refill until 03/08/11(Hydrocodone acetaminophen) He wants Dr Juanda Chance talk Dr.Ellison and tell him that he needs the medications refilled early. Told patient he should call [name] and get his back examined since he is having so much pain. Patient states he will do this.

## 2011-02-20 NOTE — Telephone Encounter (Signed)
i printed 

## 2011-02-20 NOTE — Telephone Encounter (Signed)
Reviewed and agree.

## 2011-02-20 NOTE — Telephone Encounter (Signed)
Caller states that Pt needs refill on 'pain medication' as he has "hurt his back and is going to run out of medicine before it is available for refill". Please advise.

## 2011-02-20 NOTE — Telephone Encounter (Signed)
Printed Rx faxed to [facility] @ [phone]

## 2011-04-11 ENCOUNTER — Encounter: Payer: Medicare Other | Admitting: *Deleted

## 2011-04-12 ENCOUNTER — Other Ambulatory Visit: Payer: Self-pay | Admitting: Cardiology

## 2011-04-12 DIAGNOSIS — I739 Peripheral vascular disease, unspecified: Secondary | ICD-10-CM

## 2011-04-13 ENCOUNTER — Ambulatory Visit (INDEPENDENT_AMBULATORY_CARE_PROVIDER_SITE_OTHER): Payer: Medicare Other | Admitting: Cardiovascular Disease

## 2011-04-13 ENCOUNTER — Encounter: Payer: Medicare Other | Admitting: *Deleted

## 2011-04-13 ENCOUNTER — Encounter: Payer: Self-pay | Admitting: Cardiovascular Disease

## 2011-04-13 ENCOUNTER — Encounter: Payer: Medicare Other | Admitting: Cardiology

## 2011-04-13 ENCOUNTER — Other Ambulatory Visit: Payer: Self-pay | Admitting: Cardiovascular Disease

## 2011-04-13 DIAGNOSIS — I739 Peripheral vascular disease, unspecified: Secondary | ICD-10-CM

## 2011-04-13 DIAGNOSIS — M7989 Other specified soft tissue disorders: Secondary | ICD-10-CM

## 2011-04-13 DIAGNOSIS — I10 Essential (primary) hypertension: Secondary | ICD-10-CM

## 2011-04-13 MED ORDER — LOSARTAN POTASSIUM 50 MG PO TABS: 50.0000 mg | ORAL_TABLET | Freq: Every day | ORAL | Status: DC

## 2011-04-13 NOTE — Patient Instructions (Addendum)
Your physician wants you to follow-up in: 12 months. You will receive a reminder letter in the mail two months in advance. If you don't receive a letter, please call our office to schedule the follow-up appointment.  Your physician recommends that you return for lab work next week on day of ABI's.--[id number]  Reschedule ABI's to next week  Your physician has recommended you make the following change in your medication: Start losartan 50 mg by mouth daily

## 2011-04-16 ENCOUNTER — Other Ambulatory Visit: Payer: Self-pay | Admitting: Endocrinology

## 2011-04-19 ENCOUNTER — Other Ambulatory Visit (INDEPENDENT_AMBULATORY_CARE_PROVIDER_SITE_OTHER): Payer: Medicare Other | Admitting: *Deleted

## 2011-04-19 ENCOUNTER — Encounter (INDEPENDENT_AMBULATORY_CARE_PROVIDER_SITE_OTHER): Payer: Medicare Other | Admitting: Cardiology

## 2011-04-19 DIAGNOSIS — I739 Peripheral vascular disease, unspecified: Secondary | ICD-10-CM

## 2011-04-19 DIAGNOSIS — I10 Essential (primary) hypertension: Secondary | ICD-10-CM

## 2011-04-19 LAB — BASIC METABOLIC PANEL
BUN: 21 mg/dL (ref 6–23)
Calcium: 8.8 mg/dL (ref 8.4–10.5)
Creatinine, Ser: 1.2 mg/dL (ref 0.4–1.5)
GFR: 65.32 mL/min (ref >60.00)
Glucose, Bld: 137 mg/dL — ABNORMAL HIGH (ref 70–99)
Potassium: 4.8 mEq/L (ref 3.5–5.1)

## 2011-04-26 ENCOUNTER — Ambulatory Visit (INDEPENDENT_AMBULATORY_CARE_PROVIDER_SITE_OTHER): Payer: Medicare Other | Admitting: Ophthalmology

## 2011-04-28 ENCOUNTER — Ambulatory Visit: Payer: Medicare Other | Admitting: Cardiovascular Disease

## 2011-04-28 LAB — BASIC METABOLIC PANEL
BUN: 25 — ABNORMAL HIGH
Calcium: 9.3
Chloride: 103
Creatinine, Ser: 1.7 — ABNORMAL HIGH
GFR calc Af Amer: 49 — ABNORMAL LOW
GFR calc non Af Amer: 41 — ABNORMAL LOW

## 2011-04-28 LAB — CBC
MCHC: 34
MCV: 87.8
Platelets: 217

## 2011-04-28 LAB — DIFFERENTIAL
Basophils Relative: 0
Eosinophils Absolute: 0.3
Monocytes Relative: 11
Neutrophils Relative %: 70

## 2011-05-03 NOTE — Assessment & Plan Note (Signed)
He elevated blood pressure. Recommend start losartan 50 mg daily.

## 2011-05-03 NOTE — Progress Notes (Signed)
HPI:  This is a 65 year old gentleman presenting for followup of peripheral arterial disease. He is chronically ill with complications of diabetes and morbid obesity. He gets around in a motorized scooter. The patient has bilateral leg pain with activity but this is predominantly joint pain in the knees, hips, and ankles. He has very limited mobility.  The patient's ABIs are 70% bilaterally in the past.  Outpatient Encounter Prescriptions as of 04/13/2011  Medication Sig Dispense Refill  . aspirin 81 MG tablet Take 81 mg by mouth daily.        . carvedilol (COREG) 3.125 MG tablet Take 3.125 mg by mouth 2 (two) times daily with a meal.        . clotrimazole-betamethasone (LOTRISONE) cream APPLY THREE TIMES DAILY AS NEEDED FOR RASH  45 g  5  . Docusate Sodium (STOOL SOFTENER) 100 MG capsule Take 100 mg by mouth daily.        . furosemide (LASIX) 40 MG tablet Take 2 tablets by mouth once daily  180 tablet  2  . gabapentin (NEURONTIN) 300 MG capsule Take 1 capsule (300 mg total) by mouth at bedtime.  30 capsule  11  . HYDROcodone-acetaminophen (NORCO) 10-325 MG per tablet Take 1 tablet by mouth every 4 (four) hours as needed for pain.  120 tablet  2  . Insulin Syringe-Needle U-100 (INSULIN SYRINGE .5CC/31GX5/16") 31G X 5/16" 0.5 ML MISC As directed      . omeprazole (PRILOSEC) 20 MG capsule Take 1 capsule (20 mg total) by mouth daily.  30 capsule  3  . DISCONTD: HUMULIN N 100 UNIT/ML injection Inject 20 units once daily       . losartan (COZAAR) 50 MG tablet Take 1 tablet (50 mg total) by mouth daily.  30 tablet  11    Allergies  Allergen Reactions  . Sildenafil     Past Medical History  Diagnosis Date  . CORONARY ARTERY DISEASE 03/11/2007  . DIABETES MELLITUS, TYPE I 03/11/2007  . HYPERTENSION 03/11/2007  . BLINDNESS 05/21/2008  . Proliferative diabetic retinopathy 03/09/2009  . HYPERCHOLESTEROLEMIA 09/28/2008  . CERVICAL RADICULOPATHY, LEFT 09/23/2008  . ANEMIA-IRON DEFICIENCY 09/23/2008  .  DEPRESSION 09/23/2008  . PERIPHERAL NEUROPATHY 09/23/2008  . MYOCARDIAL INFARCTION, HX OF 09/23/2008  . CONGESTIVE HEART FAILURE 09/23/2008  . RENAL INSUFFICIENCY 03/15/2010  . CEREBROVASCULAR ACCIDENT, HX OF 09/23/2008  . 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 Venous insufficiency of leg   . ED (erectile dysfunction)   . Charcot foot due to diabetes mellitus     chronic pain  . Hypogonadism male   . Congestive heart failure   . Duodenal ulcer 2012    multiple  . GI bleed   . Osteoarthritis     ROS: Negative except as per HPI  BP 158/68  Pulse 61  Ht 5\' 8"  (1.727 m)  Wt 228 lb (103.42 kg)  BMI 34.67 kg/m2  PHYSICAL EXAM: Pt is alert and oriented, obese male in NAD HEENT: normal Neck: JVP - normal, carotids 2+= without bruits Lungs: CTA bilaterally CV: RRR without murmur or gallop Abd: soft, NT, Positive BS, OB Ext: 2+ bilateral pretibial edema with stasis ulcerations and chronic stasis dermatitis, pedal pulses are nonpalpable  EKG:  Normal sinus rhythm 61 beats per minute, right bundle branch block, inferior infarct age undetermined, cannot rule out lateral infarct age undetermined  ASSESSMENT AND PLAN:

## 2011-05-03 NOTE — Assessment & Plan Note (Signed)
Recommend check ankle-brachial indices to evaluate for stability of his PAD. I don't see any evidence of critical limb ischemia. I think most of the patient's changes in his legs are related to chronic venous disease.

## 2011-05-12 ENCOUNTER — Other Ambulatory Visit: Payer: Self-pay | Admitting: Endocrinology

## 2011-05-12 NOTE — Telephone Encounter (Signed)
Rx faxed to [facility]. 

## 2011-05-12 NOTE — Telephone Encounter (Signed)
Please advise-last written 02/20/2011 #120 with 2 refills.

## 2011-05-17 ENCOUNTER — Ambulatory Visit (INDEPENDENT_AMBULATORY_CARE_PROVIDER_SITE_OTHER): Payer: Medicare Other | Admitting: *Deleted

## 2011-05-17 DIAGNOSIS — Z23 Encounter for immunization: Secondary | ICD-10-CM

## 2011-05-18 ENCOUNTER — Ambulatory Visit: Payer: Medicare Other | Admitting: Endocrinology

## 2011-06-08 ENCOUNTER — Encounter: Payer: Self-pay | Admitting: Endocrinology

## 2011-06-08 ENCOUNTER — Ambulatory Visit (INDEPENDENT_AMBULATORY_CARE_PROVIDER_SITE_OTHER): Payer: Medicare Other | Admitting: Endocrinology

## 2011-06-08 VITALS — BP 128/68 | HR 63 | Temp 98.0°F | Ht 67.0 in | Wt 238.0 lb

## 2011-06-08 DIAGNOSIS — E109 Type 1 diabetes mellitus without complications: Secondary | ICD-10-CM

## 2011-06-08 MED ORDER — HYDROCODONE-ACETAMINOPHEN 10-325 MG PO TABS: 1.0000 | ORAL_TABLET | ORAL | Status: DC | PRN

## 2011-06-08 MED ORDER — GABAPENTIN 300 MG PO CAPS: 300.0000 mg | ORAL_CAPSULE | Freq: Two times a day (BID) | ORAL | Status: DC

## 2011-06-08 NOTE — Patient Instructions (Addendum)
blood tests are being ordered for you today.  please call [phone] to hear your test results.  You will be prompted to enter the 9-digit "MRN" number that appears at the top left of this page, followed by #.  Then you will hear the message. check your blood sugar 2 times a day.  vary the time of day when you check, between before the 3 meals, and at bedtime.  also check if you have symptoms of your blood sugar being too high or too low.  please keep a record of the readings and bring it to your next appointment here.  please call us sooner if you are having low blood sugar episodes. Please make a regular physical appointment in 3 months.     Here is a refill for your hydrocodone/apap.  There are 150 per month, so you can take as many as 5/day.   Increase gabapentin to 300 mg, 2x a day.   You need both blood-pressure medications (carvedilol and losartan).

## 2011-06-08 NOTE — Progress Notes (Signed)
Subjective:    Patient ID: Mark French, male    DOB: Jun 17, 1946, 65 y.o.   MRN: [id number]  HPI no cbg record, but states cbg's are well-controlled.  He does not want to take insulin more frequently.   He report few years of severe pain at the legs, and assoc numbness.  He gets insufficient relief from the gabapentin and norco.  He also has ongoing left shoulder pain.   Past Medical History  Diagnosis Date  . CORONARY ARTERY DISEASE 03/11/2007  . DIABETES MELLITUS, TYPE I 03/11/2007  . HYPERTENSION 03/11/2007  . BLINDNESS 05/21/2008  . Proliferative diabetic retinopathy 03/09/2009  . HYPERCHOLESTEROLEMIA 09/28/2008  . CERVICAL RADICULOPATHY, LEFT 09/23/2008  . ANEMIA-IRON DEFICIENCY 09/23/2008  . DEPRESSION 09/23/2008  . PERIPHERAL NEUROPATHY 09/23/2008  . MYOCARDIAL INFARCTION, HX OF 09/23/2008  . CONGESTIVE HEART FAILURE 09/23/2008  . RENAL INSUFFICIENCY 03/15/2010  . CEREBROVASCULAR ACCIDENT, HX OF 09/23/2008  . 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 Venous insufficiency of leg   . ED (erectile dysfunction)   . Charcot foot due to diabetes mellitus     chronic pain  . Hypogonadism male   . Congestive heart failure   . Duodenal ulcer 2012    multiple  . GI bleed   . Osteoarthritis     Past Surgical History  Procedure Date  . Coronary artery bypass graft   . Carpal tunnel release     left  . Wrist surgery     s/p right-bone graft fusion  . Spine surgery 2010    C spine surgery for disc Dr. Yetta Barre  . Sigmoidoscopy 03/11/2001  . Venous doppler 01/30/2004  . Cataract surgery   . Ankle surgery     left  . Foot surgery     left    History   Social History  . Marital Status: Married    Spouse Name: N/A    Number of Children: 0  . Years of Education: N/A   Occupational History  . Disabled    Social History Main Topics  . Smoking status: Never Smoker   . Smokeless tobacco: Never Used  . Alcohol Use: No  . Drug Use: No  . Sexually Active: Not on  file   Other Topics Concern  . Not on file   Social History Narrative   Comes to appointments with wife0 Caffeine drinks daily     Current Outpatient Prescriptions on File Prior to Visit  Medication Sig Dispense Refill  . aspirin 81 MG tablet Take 81 mg by mouth daily.        . carvedilol (COREG) 3.125 MG tablet Take 3.125 mg by mouth 2 (two) times daily with a meal.        . clotrimazole-betamethasone (LOTRISONE) cream APPLY THREE TIMES DAILY AS NEEDED FOR RASH  45 g  5  . Docusate Sodium (STOOL SOFTENER) 100 MG capsule Take 100 mg by mouth daily.        . furosemide (LASIX) 40 MG tablet Take 2 tablets by mouth once daily  180 tablet  2  . Insulin Syringe-Needle U-100 (INSULIN SYRINGE .5CC/31GX5/16") 31G X 5/16" 0.5 ML MISC As directed      . losartan (COZAAR) 50 MG tablet Take 1 tablet (50 mg total) by mouth daily.  30 tablet  11  . omeprazole (PRILOSEC) 20 MG capsule Take 1 capsule (20 mg total) by mouth daily.  30 capsule  3    Allergies  Allergen Reactions  . Sildenafil  Family History  Problem Relation Age of Onset  . Heart disease Mother     Coronary Artery Disease  . Heart disease Father     Coronary Artery Disease  . Heart disease Paternal Grandmother     Coronary Artery Disease  . Colon cancer Neg Hx   . Diabetes Other     Grandmother   BP 128/68  Pulse 63  Temp(Src) 98 F (36.7 C) (Oral)  Ht 5\' 7"  (1.702 m)  Wt 238 lb (107.956 kg)  BMI 37.28 kg/m2  SpO2 97%  Review of Systems denies hypoglycemia and sob    Objective:   Physical Exam VITAL SIGNS:  See vs page GENERAL: no distress Ext: 1+ bilat leg edema.    Assessment & Plan:  Shoulder pain.  Not a surgical candidate due to med probs.   Dm, goals should be modest, given this regimen, which does match insulin to his changing needs throughout the day Neuropathic pain, needs increased rx.

## 2011-06-14 ENCOUNTER — Other Ambulatory Visit: Payer: Self-pay | Admitting: Internal Medicine

## 2011-06-26 NOTE — H&P (Signed)
  NAME:  Mark French, MOROCHO NO.:  [id number]  MEDICAL RECORD NO.:  [id number]          PATIENT TYPE:  REC  LOCATION:  FOOT                         FACILITY:  [facility]  PHYSICIAN:  Ardath Sax, M.D.     DATE OF BIRTH:  March 21, 1946  DATE OF ADMISSION:  05/24/2010 DATE OF DISCHARGE:                             HISTORY & PHYSICAL   [name] is a 65 year old man who has been here many times for venous stasis disease with dermatitis and ulcerations.  He has been treated with compression.  He has completed with Unna boots.  He has a history in the past of hypertension.  He takes Lasix.  He has been on Lasix.  He has had Unna boots many times in the past.  He has been treated also with doxycycline and at this time, he has got bilateral ulcers.  His vital signs are okay.  He has got a temperature of 98, pulse 67, blood pressure 167/80.  He has been sent for ABIs and a venous Doppler study and we will plan if all these are okay to put Unna boots on him.  In the meantime, we will treat his wounds with Puracol and he will come back in a week.     Ardath Sax, M.D.     PP/MEDQ  D:  05/25/2010  T:  05/26/2010  Job:  [id number]  Electronically Signed by Ardath Sax  on 06/26/2011 01:36:01 PM

## 2011-07-20 ENCOUNTER — Ambulatory Visit: Payer: Medicare Other | Admitting: Endocrinology

## 2011-07-24 ENCOUNTER — Encounter (HOSPITAL_BASED_OUTPATIENT_CLINIC_OR_DEPARTMENT_OTHER): Payer: Medicare Other | Attending: Internal Medicine

## 2011-07-24 DIAGNOSIS — L98499 Non-pressure chronic ulcer of skin of other sites with unspecified severity: Secondary | ICD-10-CM | POA: Insufficient documentation

## 2011-07-24 DIAGNOSIS — Z7982 Long term (current) use of aspirin: Secondary | ICD-10-CM | POA: Insufficient documentation

## 2011-07-24 DIAGNOSIS — E119 Type 2 diabetes mellitus without complications: Secondary | ICD-10-CM | POA: Insufficient documentation

## 2011-07-24 DIAGNOSIS — Z79899 Other long term (current) drug therapy: Secondary | ICD-10-CM | POA: Insufficient documentation

## 2011-07-24 DIAGNOSIS — I872 Venous insufficiency (chronic) (peripheral): Secondary | ICD-10-CM | POA: Insufficient documentation

## 2011-07-25 NOTE — Progress Notes (Unsigned)
[facility]  NAME:  Mark French, Mark French               ACCOUNT NO.:  [id number]  MEDICAL RECORD NO.:  [id number]      DATE OF BIRTH:  1945/08/11  PHYSICIAN:  Jonelle Sports. Lusia Greis, M.D.  VISIT DATE:  07/24/2011                                  OFFICE VISIT   HISTORY:  This is a 65 year old white male with longstanding diabetes who is seen for problematic swelling and weeping of his lower extremities.  In addition to his diabetes, the patient has a history of chronic venous insufficiency and has been seen in this clinic before for venous ulcerations.  He was last released in October 2011.  He reports since that time his health has remained fairly stable except that he had a single episode of upper gastrointestinal bleeding, found to be due to an ulcer which was treated medically with success.  Otherwise, he has had no hospitalizations, no major change since his last recorded history here.  ALLERGIES:  He has no medicinal allergies.  MEDICATIONS:  His regular medications include: 1. Humulin-N 20 units in the morning. 2. Humulin R sliding scale before each meal. 3. Baby aspirin 81 mg daily. 4. Carvedilol, dose uncertain daily. 5. Lasix 20 mg p.o. as needed. 6. Losartan daily. 7. Gabapentin 2 times daily. 8. Omeprazole 40 mg once daily for his ulcer disease.  SOCIAL HISTORY:  He is married, lives with his wife.  He has considerable help from her with most of his activities of daily living. He does not smoke or use alcohol or recreational drugs.  He is unemployed.  PAST MEDICAL HISTORY:  The patient reports that his diabetes he thinks is in reasonable control.  He has had no known renal disease, related to his diabetes, has had eye problems and is legally blind on that basis.  PHYSICAL EXAMINATION:  VITAL SIGNS: Blood pressure is 152/78, pulse is 65 and regular, respirations 18, temperature 98.1, random capillary blood sugar 144 mg/dL.  His vital signs, as indicated  above. GENERAL: Obese male appearing approximately his stated age of 32, obviously blind, in no immediate distress. THROAT: His mucous membranes are moist. SKIN: Warm and dry.  His color is perhaps slightly pallor but not strikingly so. CHEST: Grossly clear. CARDIOVASCULAR:  His heart tones are regular without murmur or gallop. ABDOMEN: Obese without palpable organomegaly or masses. EXTREMITIES:  Show diminished distal pulses and chronic edema from the knees downward with the  leg showing considerable inflammation and several preulcerative areas, although he has no absolute open ulcers.  IMPRESSION:  Chronic venous insufficiency with near reactivation of chronic venous ulcerations.  DISPOSITION: 1. The patient is reminded of the importance of keeping his diabetes     under his good care as possible. 2. Unless these legs get worse, he is today treated with Unna boots     bilaterally covered by Kerlix and Coban. He will be seen again in 2 weeks (in view of the holidays), but his wife is instructed that if these wraps become loose or there are any other problems, then she should contact the clinic and that someone will be here every day except Christmas Eve, Christmas Day, and New Year's Day. Otherwise, we will expect the patient in 2 weeks as indicated.  ______________________________ Jonelle Sports Cheryll Cockayne, M.D.     RES/MEDQ  D:  07/24/2011  T:  07/25/2011  Job:  [id number]

## 2011-08-08 HISTORY — PX: CORONARY ANGIOPLASTY WITH STENT PLACEMENT: SHX49

## 2011-08-10 ENCOUNTER — Encounter (HOSPITAL_BASED_OUTPATIENT_CLINIC_OR_DEPARTMENT_OTHER): Payer: Medicare Other | Attending: Internal Medicine

## 2011-08-10 DIAGNOSIS — E119 Type 2 diabetes mellitus without complications: Secondary | ICD-10-CM | POA: Insufficient documentation

## 2011-08-10 DIAGNOSIS — I872 Venous insufficiency (chronic) (peripheral): Secondary | ICD-10-CM | POA: Insufficient documentation

## 2011-08-10 DIAGNOSIS — L97809 Non-pressure chronic ulcer of other part of unspecified lower leg with unspecified severity: Secondary | ICD-10-CM | POA: Insufficient documentation

## 2011-08-22 ENCOUNTER — Other Ambulatory Visit: Payer: Self-pay

## 2011-08-22 ENCOUNTER — Emergency Department (HOSPITAL_COMMUNITY): Payer: Medicare Other

## 2011-08-22 ENCOUNTER — Encounter (HOSPITAL_COMMUNITY): Payer: Self-pay | Admitting: Cardiology

## 2011-08-22 ENCOUNTER — Inpatient Hospital Stay (HOSPITAL_COMMUNITY)
Admission: EM | Admit: 2011-08-22 | Discharge: 2011-08-24 | DRG: 948 | Disposition: A | Discharge status: Home / Self Care | Payer: Medicare Other | Source: Ambulatory Visit | Attending: Internal Medicine | Admitting: Internal Medicine

## 2011-08-22 ENCOUNTER — Inpatient Hospital Stay (HOSPITAL_COMMUNITY): Payer: Medicare Other

## 2011-08-22 DIAGNOSIS — G629 Polyneuropathy, unspecified: Secondary | ICD-10-CM | POA: Insufficient documentation

## 2011-08-22 DIAGNOSIS — I739 Peripheral vascular disease, unspecified: Secondary | ICD-10-CM

## 2011-08-22 DIAGNOSIS — N259 Disorder resulting from impaired renal tubular function, unspecified: Secondary | ICD-10-CM | POA: Insufficient documentation

## 2011-08-22 DIAGNOSIS — Z8673 Personal history of transient ischemic attack (TIA), and cerebral infarction without residual deficits: Secondary | ICD-10-CM

## 2011-08-22 DIAGNOSIS — Z951 Presence of aortocoronary bypass graft: Secondary | ICD-10-CM

## 2011-08-22 DIAGNOSIS — L97809 Non-pressure chronic ulcer of other part of unspecified lower leg with unspecified severity: Secondary | ICD-10-CM | POA: Diagnosis present

## 2011-08-22 DIAGNOSIS — I509 Heart failure, unspecified: Secondary | ICD-10-CM | POA: Diagnosis present

## 2011-08-22 DIAGNOSIS — E291 Testicular hypofunction: Secondary | ICD-10-CM

## 2011-08-22 DIAGNOSIS — H548 Legal blindness, as defined in USA: Secondary | ICD-10-CM

## 2011-08-22 DIAGNOSIS — Z8679 Personal history of other diseases of the circulatory system: Secondary | ICD-10-CM | POA: Insufficient documentation

## 2011-08-22 DIAGNOSIS — G609 Hereditary and idiopathic neuropathy, unspecified: Secondary | ICD-10-CM | POA: Diagnosis present

## 2011-08-22 DIAGNOSIS — T68XXXA Hypothermia, initial encounter: Secondary | ICD-10-CM | POA: Diagnosis present

## 2011-08-22 DIAGNOSIS — E11359 Type 2 diabetes mellitus with proliferative diabetic retinopathy without macular edema: Secondary | ICD-10-CM | POA: Diagnosis present

## 2011-08-22 DIAGNOSIS — I5022 Chronic systolic (congestive) heart failure: Secondary | ICD-10-CM | POA: Insufficient documentation

## 2011-08-22 DIAGNOSIS — E875 Hyperkalemia: Secondary | ICD-10-CM

## 2011-08-22 DIAGNOSIS — R0602 Shortness of breath: Secondary | ICD-10-CM

## 2011-08-22 DIAGNOSIS — I252 Old myocardial infarction: Secondary | ICD-10-CM

## 2011-08-22 DIAGNOSIS — E1039 Type 1 diabetes mellitus with other diabetic ophthalmic complication: Secondary | ICD-10-CM | POA: Diagnosis present

## 2011-08-22 DIAGNOSIS — D509 Iron deficiency anemia, unspecified: Secondary | ICD-10-CM

## 2011-08-22 DIAGNOSIS — E7800 Pure hypercholesterolemia, unspecified: Secondary | ICD-10-CM | POA: Diagnosis present

## 2011-08-22 DIAGNOSIS — I10 Essential (primary) hypertension: Secondary | ICD-10-CM | POA: Diagnosis present

## 2011-08-22 DIAGNOSIS — L97909 Non-pressure chronic ulcer of unspecified part of unspecified lower leg with unspecified severity: Secondary | ICD-10-CM | POA: Insufficient documentation

## 2011-08-22 DIAGNOSIS — B372 Candidiasis of skin and nail: Secondary | ICD-10-CM | POA: Diagnosis present

## 2011-08-22 DIAGNOSIS — H543 Unqualified visual loss, both eyes: Secondary | ICD-10-CM | POA: Diagnosis present

## 2011-08-22 DIAGNOSIS — F329 Major depressive disorder, single episode, unspecified: Secondary | ICD-10-CM

## 2011-08-22 DIAGNOSIS — R4182 Altered mental status, unspecified: Principal | ICD-10-CM | POA: Diagnosis present

## 2011-08-22 DIAGNOSIS — IMO0001 Reserved for inherently not codable concepts without codable children: Secondary | ICD-10-CM

## 2011-08-22 DIAGNOSIS — R609 Edema, unspecified: Secondary | ICD-10-CM

## 2011-08-22 DIAGNOSIS — I2510 Atherosclerotic heart disease of native coronary artery without angina pectoris: Secondary | ICD-10-CM | POA: Diagnosis present

## 2011-08-22 DIAGNOSIS — M79609 Pain in unspecified limb: Secondary | ICD-10-CM

## 2011-08-22 DIAGNOSIS — E109 Type 1 diabetes mellitus without complications: Secondary | ICD-10-CM

## 2011-08-22 DIAGNOSIS — J309 Allergic rhinitis, unspecified: Secondary | ICD-10-CM

## 2011-08-22 DIAGNOSIS — R680 Hypothermia, not associated with low environmental temperature: Secondary | ICD-10-CM | POA: Diagnosis present

## 2011-08-22 DIAGNOSIS — E669 Obesity, unspecified: Secondary | ICD-10-CM | POA: Diagnosis present

## 2011-08-22 DIAGNOSIS — M5412 Radiculopathy, cervical region: Secondary | ICD-10-CM | POA: Diagnosis present

## 2011-08-22 LAB — COMPREHENSIVE METABOLIC PANEL
ALT: 53 U/L (ref 0–53)
AST: 38 U/L — ABNORMAL HIGH (ref 0–37)
Alkaline Phosphatase: 90 U/L (ref 39–117)
GFR calc non Af Amer: 58 mL/min — ABNORMAL LOW (ref >90)
Potassium: 5.2 mEq/L — ABNORMAL HIGH (ref 3.5–5.1)
Total Bilirubin: 0.3 mg/dL (ref 0.3–1.2)

## 2011-08-22 LAB — URINALYSIS, ROUTINE W REFLEX MICROSCOPIC
Bilirubin Urine: NEGATIVE
Glucose, UA: NEGATIVE mg/dL
Hgb urine dipstick: NEGATIVE
Ketones, ur: NEGATIVE mg/dL
Leukocytes, UA: NEGATIVE
pH: 6.5 (ref 5.0–8.0)

## 2011-08-22 LAB — LACTIC ACID, PLASMA: Lactic Acid, Venous: 2.1 mmol/L (ref 0.5–2.2)

## 2011-08-22 LAB — RAPID URINE DRUG SCREEN, HOSP PERFORMED: Barbiturates: NOT DETECTED

## 2011-08-22 LAB — URINE CULTURE
Colony Count: NO GROWTH
Culture  Setup Time: 201301151621

## 2011-08-22 LAB — CBC
HCT: 43.9 % (ref 39.0–52.0)
HCT: 41.6 % (ref 39.0–52.0)
Hemoglobin: 14 g/dL (ref 13.0–17.0)
Hemoglobin: 14 g/dL (ref 13.0–17.0)
Hemoglobin: 14 g/dL (ref 13.0–17.0)
MCH: 27.8 pg (ref 26.0–34.0)
MCH: 27.6 pg (ref 26.0–34.0)
MCHC: 31.8 g/dL (ref 30.0–36.0)
MCHC: 33.7 g/dL (ref 30.0–36.0)
MCV: 87.3 fL (ref 78.0–100.0)
MCV: 86.6 fL (ref 78.0–100.0)
RBC: 5.07 MIL/uL (ref 4.22–5.81)
RBC: 4.86 MIL/uL (ref 4.22–5.81)
WBC: 11 10*3/uL — ABNORMAL HIGH (ref 4.0–10.5)

## 2011-08-22 LAB — GLUCOSE, CAPILLARY: Glucose-Capillary: 140 mg/dL — ABNORMAL HIGH (ref 70–99)

## 2011-08-22 LAB — POCT I-STAT, CHEM 8
BUN: 28 mg/dL — ABNORMAL HIGH (ref 6–23)
Calcium, Ion: 1.14 mmol/L (ref 1.12–1.32)
Chloride: 105 mEq/L (ref 96–112)
HCT: 46 % (ref 39.0–52.0)
Potassium: 5.1 mEq/L (ref 3.5–5.1)

## 2011-08-22 LAB — PROTIME-INR
INR: 1.07 (ref 0.00–1.49)
Prothrombin Time: 14.1 seconds (ref 11.6–15.2)

## 2011-08-22 LAB — DIFFERENTIAL
Basophils Relative: 0 % (ref 0–1)
Eosinophils Absolute: 0.2 10*3/uL (ref 0.0–0.7)
Eosinophils Relative: 2 % (ref 0–5)
Monocytes Relative: 9 % (ref 3–12)
Neutrophils Relative %: 75 % (ref 43–77)

## 2011-08-22 LAB — CREATININE, SERUM
Creatinine, Ser: 1.01 mg/dL (ref 0.50–1.35)
GFR calc Af Amer: 87 mL/min — ABNORMAL LOW (ref >90)
GFR calc non Af Amer: 75 mL/min — ABNORMAL LOW (ref >90)

## 2011-08-22 LAB — HEPATIC FUNCTION PANEL
AST: 63 U/L — ABNORMAL HIGH (ref 0–37)
Albumin: 3.7 g/dL (ref 3.5–5.2)
Total Protein: 7.9 g/dL (ref 6.0–8.3)

## 2011-08-22 LAB — ACETAMINOPHEN LEVEL: Acetaminophen (Tylenol), Serum: <15 ug/mL (ref 10–30)

## 2011-08-22 LAB — URINE MICROSCOPIC-ADD ON

## 2011-08-22 MED ORDER — ASPIRIN 81 MG PO TABS
81.0000 mg | ORAL_TABLET | Freq: Every day | ORAL | Status: DC
  Administered 2011-08-23: 81 mg via ORAL
  Filled 2011-08-22: qty 1

## 2011-08-22 MED ORDER — CLOTRIMAZOLE 1 % EX CREA
TOPICAL_CREAM | Freq: Two times a day (BID) | CUTANEOUS | Status: DC | PRN
  Filled 2011-08-22: qty 15

## 2011-08-22 MED ORDER — INSULIN ASPART 100 UNIT/ML ~~LOC~~ SOLN
0.0000 [IU] | Freq: Three times a day (TID) | SUBCUTANEOUS | Status: DC
  Administered 2011-08-23: 5 [IU] via SUBCUTANEOUS
  Administered 2011-08-24: 3 [IU] via SUBCUTANEOUS
  Filled 2011-08-22: qty 3

## 2011-08-22 MED ORDER — LOSARTAN POTASSIUM 50 MG PO TABS
50.0000 mg | ORAL_TABLET | Freq: Every day | ORAL | Status: DC
Start: 2011-08-23 — End: 2011-08-24
  Administered 2011-08-23 – 2011-08-24 (×2): 50 mg via ORAL
  Filled 2011-08-22 (×2): qty 1

## 2011-08-22 MED ORDER — INSULIN NPH (HUMAN) (ISOPHANE) 100 UNIT/ML ~~LOC~~ SUSP
16.0000 [IU] | Freq: Every day | SUBCUTANEOUS | Status: DC
  Administered 2011-08-23 – 2011-08-24 (×2): 16 [IU] via SUBCUTANEOUS
  Filled 2011-08-22: qty 10

## 2011-08-22 MED ORDER — DOCUSATE SODIUM 100 MG PO CAPS
100.0000 mg | ORAL_CAPSULE | Freq: Two times a day (BID) | ORAL | Status: DC
  Administered 2011-08-22 – 2011-08-24 (×3): 100 mg via ORAL
  Filled 2011-08-22 (×5): qty 1

## 2011-08-22 MED ORDER — SODIUM CHLORIDE 0.9 % IJ SOLN: 3.0000 mL | INTRAMUSCULAR | Status: DC | PRN

## 2011-08-22 MED ORDER — ACETAMINOPHEN 650 MG RE SUPP: 650.0000 mg | Freq: Four times a day (QID) | RECTAL | Status: DC | PRN

## 2011-08-22 MED ORDER — ONDANSETRON HCL 4 MG/2ML IJ SOLN: 4.0000 mg | Freq: Four times a day (QID) | INTRAMUSCULAR | Status: DC | PRN

## 2011-08-22 MED ORDER — ONDANSETRON HCL 4 MG PO TABS
4.0000 mg | ORAL_TABLET | Freq: Four times a day (QID) | ORAL | Status: DC | PRN
  Administered 2011-08-23: 4 mg via ORAL
  Filled 2011-08-22: qty 1

## 2011-08-22 MED ORDER — GUAIFENESIN-DM 100-10 MG/5ML PO SYRP
5.0000 mL | ORAL_SOLUTION | ORAL | Status: DC | PRN
  Filled 2011-08-22: qty 5

## 2011-08-22 MED ORDER — FUROSEMIDE 40 MG PO TABS
40.0000 mg | ORAL_TABLET | Freq: Every day | ORAL | Status: DC | PRN
  Filled 2011-08-22: qty 1

## 2011-08-22 MED ORDER — GABAPENTIN 300 MG PO CAPS
300.0000 mg | ORAL_CAPSULE | Freq: Two times a day (BID) | ORAL | Status: DC
  Administered 2011-08-22 – 2011-08-24 (×4): 300 mg via ORAL
  Filled 2011-08-22 (×5): qty 1

## 2011-08-22 MED ORDER — ACETAMINOPHEN 325 MG PO TABS: 650.0000 mg | ORAL_TABLET | Freq: Four times a day (QID) | ORAL | Status: DC | PRN

## 2011-08-22 MED ORDER — NALOXONE HCL 0.4 MG/ML IJ SOLN
0.4000 mg | Freq: Once | INTRAMUSCULAR | Status: AC
  Administered 2011-08-22: 0.4 mg via INTRAVENOUS
  Filled 2011-08-22: qty 1

## 2011-08-22 MED ORDER — ALBUTEROL SULFATE (5 MG/ML) 0.5% IN NEBU: 2.5000 mg | INHALATION_SOLUTION | RESPIRATORY_TRACT | Status: DC | PRN

## 2011-08-22 MED ORDER — CARVEDILOL 3.125 MG PO TABS
3.1250 mg | ORAL_TABLET | Freq: Two times a day (BID) | ORAL | Status: DC
  Administered 2011-08-23 – 2011-08-24 (×3): 3.125 mg via ORAL
  Filled 2011-08-22 (×6): qty 1

## 2011-08-22 MED ORDER — ENOXAPARIN SODIUM 40 MG/0.4ML ~~LOC~~ SOLN
40.0000 mg | SUBCUTANEOUS | Status: DC
  Administered 2011-08-22 – 2011-08-23 (×2): 40 mg via SUBCUTANEOUS
  Filled 2011-08-22 (×3): qty 0.4

## 2011-08-22 MED ORDER — ALUM & MAG HYDROXIDE-SIMETH 200-200-20 MG/5ML PO SUSP: 30.0000 mL | Freq: Four times a day (QID) | ORAL | Status: DC | PRN

## 2011-08-22 MED ORDER — PANTOPRAZOLE SODIUM 40 MG PO TBEC
40.0000 mg | DELAYED_RELEASE_TABLET | Freq: Every day | ORAL | Status: DC
  Administered 2011-08-22 – 2011-08-24 (×3): 40 mg via ORAL
  Filled 2011-08-22 (×3): qty 1

## 2011-08-22 MED ORDER — HYDROCODONE-ACETAMINOPHEN 10-325 MG PO TABS
1.0000 | ORAL_TABLET | ORAL | Status: DC | PRN
  Administered 2011-08-23 (×4): 1 via ORAL
  Filled 2011-08-22 (×4): qty 1

## 2011-08-22 NOTE — ED Notes (Signed)
CBG: 208 

## 2011-08-22 NOTE — ED Notes (Signed)
[id number] Ready 

## 2011-08-22 NOTE — ED Notes (Addendum)
Pt is much more alert that he was upon arrival and can answer questions with a nod of the head.  Still aphasic but is trying to speak.  He becomes frustrated and sighs when he is trying to formulate speech.  Wife and neighbor at the bedside. Pt does not appear to be in pain/discomfort based on the faces scale.

## 2011-08-22 NOTE — ED Provider Notes (Signed)
History     CSN: [id number]  Arrival date & time 08/22/11  1105   None     Chief Complaint  Patient presents with  . Code Stroke   Patient with decreased loc. (Consider location/radiation/quality/duration/timing/severity/associated sxs/prior treatment) HPI Wife left for work at 0730 patient in usual state of health.  Patient with limited mobility but ambulates with cane and does all adls.  Chronic hip and leg pain.  Neighbor attempted call at 0930 then went over at 1015 and found in recliner with eyes open but verbally unresponsive.  Patient presented as code stroke.  PMD [name] Past Medical History  Diagnosis Date  . CORONARY ARTERY DISEASE 03/11/2007  . DIABETES MELLITUS, TYPE I 03/11/2007  . HYPERTENSION 03/11/2007  . BLINDNESS 05/21/2008  . Proliferative diabetic retinopathy 03/09/2009  . HYPERCHOLESTEROLEMIA 09/28/2008  . CERVICAL RADICULOPATHY, LEFT 09/23/2008  . ANEMIA-IRON DEFICIENCY 09/23/2008  . DEPRESSION 09/23/2008  . PERIPHERAL NEUROPATHY 09/23/2008  . MYOCARDIAL INFARCTION, HX OF 09/23/2008  . CONGESTIVE HEART FAILURE 09/23/2008  . RENAL INSUFFICIENCY 03/15/2010  . CEREBROVASCULAR ACCIDENT, HX OF 09/23/2008  . 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 Venous insufficiency of leg   . ED (erectile dysfunction)   . Charcot foot due to diabetes mellitus     chronic pain  . Hypogonadism male   . Congestive heart failure   . Duodenal ulcer 2012    multiple  . GI bleed   . Osteoarthritis     Past Surgical History  Procedure Date  . Coronary artery bypass graft   . Carpal tunnel release     left  . Wrist surgery     s/p right-bone graft fusion  . Spine surgery 2010    C spine surgery for disc Dr. Yetta Barre  . Sigmoidoscopy 03/11/2001  . Venous doppler 01/30/2004  . Cataract surgery   . Ankle surgery     left  . Foot surgery     left    Family History  Problem Relation Age of Onset  . Heart disease Mother     Coronary Artery Disease  .  Heart disease Father     Coronary Artery Disease  . Heart disease Paternal Grandmother     Coronary Artery Disease  . Colon cancer Neg Hx   . Diabetes Other     Grandmother    History  Substance Use Topics  . Smoking status: Never Smoker   . Smokeless tobacco: Never Used  . Alcohol Use: No      Review of Systems  Unable to perform ROS   Allergies  Contrast media and Sildenafil  Home Medications   Current Outpatient Rx  Name Route Sig Dispense Refill  . ASPIRIN 81 MG PO TABS Oral Take 81 mg by mouth daily.      [name] CARVEDILOL 3.125 MG PO TABS Oral Take 3.125 mg by mouth 2 (two) times daily with a meal.      . CLOTRIMAZOLE-BETAMETHASONE 1-0.05 % EX CREA  APPLY THREE TIMES DAILY AS NEEDED FOR RASH 45 g 5  . DOCUSATE SODIUM 100 MG PO TABS Oral Take 100 mg by mouth daily.      . FUROSEMIDE 40 MG PO TABS  Take 2 tablets by mouth once daily 180 tablet 2  . GABAPENTIN 300 MG PO CAPS Oral Take 1 capsule (300 mg total) by mouth 2 (two) times daily. 60 capsule 11  . HYDROCODONE-ACETAMINOPHEN 10-325 MG PO TABS Oral Take 1 tablet by mouth every 4 (four)  hours as needed for pain. 150 tablet 5  . INSULIN ISOPHANE HUMAN 100 UNIT/ML [place] SUSP Subcutaneous Inject 15 Units into the skin daily before breakfast.      . INSULIN SYRINGE 31G X 5/16" 0.5 ML MISC  As directed    . LOSARTAN POTASSIUM 50 MG PO TABS Oral Take 1 tablet (50 mg total) by mouth daily. 30 tablet 11  . OMEPRAZOLE 20 MG PO CPDR  TAKE 1 CAPSULE BY MOUTH DAILY 30 capsule 3    BP 160/103  Pulse 79  Temp(Src) 97.7 F (36.5 C) (Axillary)  Resp 20  SpO2 90%  Physical Exam  Nursing note and vitals reviewed. Constitutional: He appears well-nourished.  HENT:  Head: Normocephalic and atraumatic.  Right Ear: External ear normal.  Left Ear: External ear normal.  Mouth/Throat: Oropharynx is clear and moist.  Eyes: Conjunctivae are normal. Pupils are equal, round, and reactive to light.  Neck: Normal range of motion.    Cardiovascular: Normal rate.   Pulmonary/Chest: Effort normal and breath sounds normal.  Abdominal: Soft. Bowel sounds are normal.  Musculoskeletal:       Atrophic right side  Neurological:       Patient with eyes open, but does not track.  Patient with atrophy to right side and unable to cooperate with neuro exam.    Skin: Skin is warm.       Erythema bilateral groin     ED Course  Procedures (including critical care time)   Labs Reviewed  POCT CBG MONITORING   No results found.  Results for orders placed during the hospital encounter of 08/22/11  PROTIME-INR      Component Value Range   Prothrombin Time 14.1  11.6 - 15.2 (seconds)   INR 1.07  0.00 - 1.49   APTT      Component Value Range   aPTT 29  24 - 37 (seconds)  CBC      Component Value Range   WBC 10.1  4.0 - 10.5 (K/uL)   RBC 5.04  4.22 - 5.81 (MIL/uL)   Hemoglobin 14.0  13.0 - 17.0 (g/dL)   HCT 16.1  09.6 - 04.5 (%)   MCV 87.3  78.0 - 100.0 (fL)   MCH 27.8  26.0 - 34.0 (pg)   MCHC 31.8  30.0 - 36.0 (g/dL)   RDW 40.9  81.1 - 91.4 (%)   Platelets 234  150 - 400 (K/uL)  DIFFERENTIAL      Component Value Range   Neutrophils Relative 75  43 - 77 (%)   Neutro Abs 7.6  1.7 - 7.7 (K/uL)   Lymphocytes Relative 14  12 - 46 (%)   Lymphs Abs 1.4  0.7 - 4.0 (K/uL)   Monocytes Relative 9  3 - 12 (%)   Monocytes Absolute 0.9  0.1 - 1.0 (K/uL)   Eosinophils Relative 2  0 - 5 (%)   Eosinophils Absolute 0.2  0.0 - 0.7 (K/uL)   Basophils Relative 0  0 - 1 (%)   Basophils Absolute 0.0  0.0 - 0.1 (K/uL)  COMPREHENSIVE METABOLIC PANEL      Component Value Range   Sodium 135  135 - 145 (mEq/L)   Potassium 5.2 (*) 3.5 - 5.1 (mEq/L)   Chloride 98  96 - 112 (mEq/L)   CO2 26  19 - 32 (mEq/L)   Glucose, Bld 229 (*) 70 - 99 (mg/dL)   BUN 26 (*) 6 - 23 (mg/dL)   Creatinine, Ser 7.82  0.50 - 1.35 (mg/dL)   Calcium 9.5  8.4 - 04.5 (mg/dL)   Total Protein 7.9  6.0 - 8.3 (g/dL)   Albumin 3.7  3.5 - 5.2 (g/dL)   AST 38 (*) 0 -  37 (U/L)   ALT 53  0 - 53 (U/L)   Alkaline Phosphatase 90  39 - 117 (U/L)   Total Bilirubin 0.3  0.3 - 1.2 (mg/dL)   GFR calc non Af Amer 58 (*) >90 (mL/min)   GFR calc Af Amer 67 (*) >90 (mL/min)  CK TOTAL AND CKMB      Component Value Range   Total CK 403 (*) 7 - 232 (U/L)   CK, MB 5.9 (*) 0.3 - 4.0 (ng/mL)   Relative Index 1.5  0.0 - 2.5   TROPONIN I      Component Value Range   Troponin I <0.30  <0.30 (ng/mL)  CBC      Component Value Range   WBC 10.2  4.0 - 10.5 (K/uL)   RBC 5.07  4.22 - 5.81 (MIL/uL)   Hemoglobin 14.0  13.0 - 17.0 (g/dL)   HCT 40.9  81.1 - 91.4 (%)   MCV 86.6  78.0 - 100.0 (fL)   MCH 27.6  26.0 - 34.0 (pg)   MCHC 31.9  30.0 - 36.0 (g/dL)   RDW 78.2  95.6 - 21.3 (%)   Platelets 240  150 - 400 (K/uL)  ETHANOL      Component Value Range   Alcohol, Ethyl (B) <11  0 - 11 (mg/dL)  GLUCOSE, CAPILLARY      Component Value Range   Glucose-Capillary 213 (*) 70 - 99 (mg/dL)   Comment 1 Documented in Chart     Comment 2 Notify RN    GLUCOSE, CAPILLARY      Component Value Range   Glucose-Capillary 208 (*) 70 - 99 (mg/dL)   Comment 1 Documented in Chart     Comment 2 Notify RN    LACTIC ACID, PLASMA      Component Value Range   Lactic Acid, Venous 2.1  0.5 - 2.2 (mmol/L)  ACETAMINOPHEN LEVEL      Component Value Range   Acetaminophen (Tylenol), Serum <15.0  10 - 30 (ug/mL)   Date: 08/22/2011  Rate: 79  Rhythm: normal sinus rhythm  QRS Axis: left  Intervals: normal  ST/T Wave abnormalities: nonspecific ST/T changes  Conduction Disutrbances:right bundle branch block  Narrative Interpretation:   Old EKG Reviewed: changes noted    No diagnosis found.  Ct Head Wo Contrast  08/22/2011  *RADIOLOGY REPORT*  Clinical Data: Sudden onset of altered mental status.  Confusion.  CT HEAD WITHOUT CONTRAST  Technique:  Contiguous axial images were obtained from the base of the skull through the vertex without contrast.  Comparison: None.  Findings: No acute  intracranial hemorrhage, infarction, or mass lesion.  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  No acute osseous abnormality.  IMPRESSION: No acute intracranial abnormality.  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  Original Report Authenticated By: Gwynn Burly, M.D.   MDM  Dr. Roseanne Reno saw and evaluated and states no focal deficit and history of Gi bleed less than one year ago.  He has canceled code stroke.   Patient with acutely altered mental status of unclear etiology. Patient initially seen as code stroke not felt to have focal neuro deficits by a neurologist blood sugar has been normal. He received Narcan without change in his mental  status. He does have Norco at home. He has a nonelevated acetaminophen level. Rectal temp obtained was low at 96.6.   Patient began speaking to family and denies memory of events after wife left this a.m.   Hilario Quarry, MD 08/22/11 1501

## 2011-08-22 NOTE — ED Notes (Signed)
Pt moved from POD-A to yellow.

## 2011-08-22 NOTE — Code Documentation (Signed)
66 year old male presented to ED via EMS at 1045.  Code stroke was called at 1032 from the field. Wife states patient got up and was at normal baseline this AM - ate breakfast - no issues.  She left for work at [employer].   At 1015 neighbor to house to check on patient who was not answering phone.  Neighbor found patient sitting in chair - staring into space and unresponsive to verbal stimulation.  EMS was called and found patient to be restless but not responding to commands.  Patient was MAE x 4.  Wife reports right side weakness from stroke when patient was 91 months old. LSW 0730.  Stroke team present at 1040.  Patient to CT scan at 1050.  Dr. Roseanne Reno present in CT scan- CT done at 1100. NIHSS 20.  Patient with eyes open = restless but inattentive to surroundings.  CBG 167.  BP elevated at 217/147 per EMS report - 160/103 in unit.  Code stroke cancelled at 1130 per Dr. Roseanne Reno.

## 2011-08-22 NOTE — ED Notes (Signed)
Pt taken from CT scanner to Pod A room 13, Neuro and stroke RN at the bedside. Determined not to be a TPA pt. Pt in no distress at this time.

## 2011-08-22 NOTE — ED Notes (Signed)
Pt semi-fowlers in bed watching TV.  Wife at [facility].  He is A/O x 4, NAD.  No verbal complaints.  Pt denies pain/discomfort.  Denies N/V.  Will continue to monitor.Grips and pedal pushes are all equal and strong without deficits  noted.

## 2011-08-22 NOTE — ED Notes (Signed)
Code Stroke Encoded-1028 Called-1032 EDP exam-1045 Stroke Team arrival-1040 LSN-0730 Pt to CT-1050 Phlebotomist arrival-1035 CT read-1100 Cancelled at- 1115

## 2011-08-22 NOTE — ED Notes (Signed)
Per pt wife- pt was sitting eating breakfast whenever he had a sudden onset of unability to talk and communicate properly. Pt wife pt had a stoke when he was 2 years ago. Pt unable to speak and make appropriate movements.

## 2011-08-22 NOTE — ED Notes (Addendum)
Pt is able to follow pen with his eyes on command without turning his head.  Horizontal gaze nystagmus noted and prominent. Pt is beginning to make incomprehensible sounds.  Per physician we are going to attempt Narcan to see if there is any difference in communication/status.  Pt has definite right side hemiparesis and facial drooping.  Mild drooling noted from the right corner of the mouth.

## 2011-08-22 NOTE — H&P (Signed)
Hospital Admission Note Date: 08/22/2011  Patient name: Mark French Medical record number: [id number] Date of birth: 1946/06/03 Age: 66 y.o. Gender: male PCP: [name], MD, MD  Attending physician: Hilario Quarry, MD  Chief Complaint: Altered mental status  History of Present Illness: Mark French is an 66 y.o. male with past medical history significant for multiple medical problems including diabetes, hypertension, coronary artery disease, congestive heart failure. Per discussion with patient his wife and the neighbor. Patient was in the fair health when she left  this morning about 7:30 AM. He had breakfast with her.  About 10:00 the neighbor who was supposed to do some work for the patient called on the phone without any response, he called again without any response he called patients wife at work then  called 911. Patient was found sitting in his recliner unresponsive but breathing. On arrival of EMS patient  with normal blood sugar normal blood pressure low temperature but unresponse with a blank stare. In the emergency department he was given Narcan IV fluids with no initial response to Narcan. Then about 2:30 patient became alert aware,  altered mental status totally resolved and patient back to baseline.  The last thing he remembers was  this morning at breakfast.   He is unsure of what happened after that. He did not lose control of his bladder or bowels did not bite his tongue. He does not have any chest pain no shortness of breath no nausea or vomiting. Normally he is able to walk around with a walker or a cane. He had a stroke when he was 50 months old and has some right-sided deficits secondary to that. He was evaluated by the stroke team who did not think the patient had a stroke. Head CT was negative. We were asked to admit patient for further evaluation.  Past Medical History  Diagnosis Date  . CORONARY ARTERY DISEASE 03/11/2007  . DIABETES MELLITUS, TYPE I 03/11/2007  .  HYPERTENSION 03/11/2007  . BLINDNESS 05/21/2008  . Proliferative diabetic retinopathy 03/09/2009  . HYPERCHOLESTEROLEMIA 09/28/2008  . CERVICAL RADICULOPATHY, LEFT 09/23/2008  . ANEMIA-IRON DEFICIENCY 09/23/2008  . DEPRESSION 09/23/2008  . PERIPHERAL NEUROPATHY 09/23/2008  . MYOCARDIAL INFARCTION, HX OF 09/23/2008  . CONGESTIVE HEART FAILURE 09/23/2008  . RENAL INSUFFICIENCY 03/15/2010  . CEREBROVASCULAR ACCIDENT, HX OF 09/23/2008  . 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 Venous insufficiency of leg   . ED (erectile dysfunction)   . Charcot foot due to diabetes mellitus     chronic pain  . Hypogonadism male   . Congestive heart failure   . Duodenal ulcer 2012    multiple  . GI bleed   . Osteoarthritis    Past Surgical History  Procedure Date  . Coronary artery bypass graft   . Carpal tunnel release     left  . Wrist surgery     s/p right-bone graft fusion  . Spine surgery 2010    C spine surgery for disc Dr. Yetta Barre  . Sigmoidoscopy 03/11/2001  . Venous doppler 01/30/2004  . Cataract surgery   . Ankle surgery     left  . Foot surgery     left   Family History  Problem Relation Age of Onset  . Heart disease Mother     Coronary Artery Disease  . Heart disease Father     Coronary Artery Disease  . Heart disease Paternal Grandmother     Coronary Artery Disease  . Colon  cancer Neg Hx   . Diabetes Other     Grandmother   History   Social History  . Marital Status: Married    Spouse Name: N/A    Number of Children: 0  . Years of Education: N/A   Occupational History  . Disabled    Social History Main Topics  . Smoking status: Never Smoker   . Smokeless tobacco: Never Used  . Alcohol Use: No  . Drug Use: No  . Sexually Active: Not on file   Other Topics Concern  . Not on file   Social History Narrative   Comes to appointments with wife0 Caffeine drinks daily    Meds: Reviewed  Allergies: Contrast media and Sildenafil  Review of  Systems: Negative otherwise stated in history of present illness.  Physical Exam: Blood pressure 162/69, pulse 79, temperature 96.6 F (35.9 C), temperature source Rectal, resp. rate 20, SpO2 98.00%. General: Patient appears older than his stated age. HEENT: Head normocephalic atraumatic pupils reactive to light. Lungs: Clear to auscultation bilaterally no wheezes rhonchi or rales. Cardiovascular: Regular rate rhythm. Abdomen: Soft obese nontender nondistended positive bowel sounds. Extremities 1+ edema on the right, unna boot on the left. Neurologic: Patient strength 5 out of 5 bilateral lower extremity 5 out of 5 left upper extremity 4/5 right upper extremity that's chronic finger to nose is intact with the left upper extremity right upper extremity has some chronic deficits. Cranial nerves 2-12 intact.   Lab results: Reviewed  Imaging results:  Ct Head Wo Contrast  08/22/2011  *RADIOLOGY REPORT*  Clinical Data: Sudden onset of altered mental status.  Confusion.  CT HEAD WITHOUT CONTRAST  Technique:  Contiguous axial images were obtained from the base of the skull through the vertex without contrast.  Comparison: None.  Findings: No acute intracranial hemorrhage, infarction, or mass lesion.  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  No acute osseous abnormality.  IMPRESSION: No acute intracranial abnormality.  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  Original Report Authenticated By: Gwynn Burly, M.D.   Dg Chest Port 1 View  08/22/2011  *RADIOLOGY REPORT*  Clinical Data: Altered level of consciousness.  PORTABLE CHEST - 1 VIEW  Comparison: Chest 11/30/2010.  Findings: There is cardiomegaly.  The patient there is status post CABG.  Mild interstitial edema is present.  No pneumothorax.  IMPRESSION: Cardiomegaly and mild interstitial edema.  Original Report Authenticated By: Bernadene Bell. Maricela Curet, M.D.    Assessment & Plan:  Altered  mental status Patient presented with altered mental status. Now that I saw patient he is back to baseline. Without any new neurologic deficit. Unsure of the etiology will get EEG to rule out a seizure episode with postictal state. Patient is now back to baseline WITHOUT ANY NEW NEUROLOGIC DEFICIT. WILL ALSO GET MRI OF THE BRAIN.  Hypertension Blood pressure is slightly elevated. Will monitor for now and resume all his home medications.    DIABETES MELLITUS, TYPE I  will continue him on NPH and sliding scale insulin. We'll check hemoglobin A1c. Blood sugars are presently stable.   CEREBROVASCULAR ACCIDENT, HX OF CONTINUE HIM ON HIS ASPIRIN AS OUTPATIENT. PATIENT WAS EVALUATED BY STROKE TEAM CT OF THE HEAD NEGATIVE WILL ORDER MRI.    ANEMIA-IRON DEFICIENCY Hemoglobin is normal. Monitor   PERIPHERAL NEUROPATHY CONTINUE GABAPENTIN.    CONGESTIVE HEART FAILURE Will check 2-D echo. Patient does not seem to be in active failure. Continue when necessary Lasix.    RENAL  INSUFFICIENCY Creatinine is stable.   ULCER, LEG Per discussion with wife patient he has leg ulcers on the left lower extremity. He normally goes to the wound care clinic once a week to have unna boot placed. He is due to get it done on Friday. We'll get a wound care consult.   CERVICAL RADICULOPATHY, LEFT When necessary pain medication.   HYPERKALEMIA Mildly elevated. Monitor for now. Recheck in the morning.   Hypothermia Not sure why he was hypothermic. Will recheck his vital signs.  Time spent with patient in doing this admission is approximately 1 hour 15 minutes.    Earlene Plater MD, Ladell Pier 08/22/2011, 3:27 PM

## 2011-08-22 NOTE — Progress Notes (Signed)
Called to get report from [facility], She was off floor transporting a patient. Will call back. [place], [employer]

## 2011-08-22 NOTE — ED Notes (Signed)
Per EMS- pt was found by neighbor when he was not answering the phone. On arrival pt was not answering questions and unable to follow simple commands. Pt able to move all extremities but not attentive to surroundings.

## 2011-08-23 ENCOUNTER — Inpatient Hospital Stay (HOSPITAL_COMMUNITY): Payer: Medicare Other

## 2011-08-23 DIAGNOSIS — I369 Nonrheumatic tricuspid valve disorder, unspecified: Secondary | ICD-10-CM

## 2011-08-23 LAB — PRO B NATRIURETIC PEPTIDE: Pro B Natriuretic peptide (BNP): 490.1 pg/mL — ABNORMAL HIGH (ref 0–125)

## 2011-08-23 LAB — BASIC METABOLIC PANEL
CO2: 27 mEq/L (ref 19–32)
Chloride: 101 mEq/L (ref 96–112)
Creatinine, Ser: 1.08 mg/dL (ref 0.50–1.35)
GFR calc Af Amer: 80 mL/min — ABNORMAL LOW (ref >90)
Potassium: 4.3 mEq/L (ref 3.5–5.1)
Sodium: 136 mEq/L (ref 135–145)

## 2011-08-23 LAB — CBC
HCT: 40 % (ref 39.0–52.0)
MCV: 85.8 fL (ref 78.0–100.0)
RBC: 4.66 MIL/uL (ref 4.22–5.81)
RDW: 14.2 % (ref 11.5–15.5)
WBC: 9.6 10*3/uL (ref 4.0–10.5)

## 2011-08-23 LAB — HEMOGLOBIN A1C
Hgb A1c MFr Bld: 6.9 % — ABNORMAL HIGH (ref <5.7)
Mean Plasma Glucose: 151 mg/dL — ABNORMAL HIGH (ref <117)

## 2011-08-23 LAB — GLUCOSE, CAPILLARY
Glucose-Capillary: 110 mg/dL — ABNORMAL HIGH (ref 70–99)
Glucose-Capillary: 88 mg/dL (ref 70–99)
Glucose-Capillary: 109 mg/dL — ABNORMAL HIGH (ref 70–99)

## 2011-08-23 LAB — TSH: TSH: 0.416 u[IU]/mL (ref 0.350–4.500)

## 2011-08-23 MED ORDER — ASPIRIN 325 MG PO TABS
325.0000 mg | ORAL_TABLET | Freq: Every day | ORAL | Status: DC
  Administered 2011-08-23 – 2011-08-24 (×2): 325 mg via ORAL
  Filled 2011-08-23 (×2): qty 1

## 2011-08-23 MED ORDER — NYSTATIN 100000 UNIT/GM EX POWD
Freq: Three times a day (TID) | CUTANEOUS | Status: DC
  Administered 2011-08-23 – 2011-08-24 (×2): via TOPICAL
  Filled 2011-08-23: qty 15

## 2011-08-23 NOTE — Consult Note (Signed)
WOC consult Note Reason for Consult: Consult requested for leg leg stasis ulcers.  Pt followed by outpatient wound care center and has been having Una boot applied Q week.  States it is currently very tight and painful. Due to be changed Fri.  Removed to assess stasis ulcers.  Right leg without open wounds. Wound type: Partial thickness stasis ulcers. Measurement: .3X.3X.1cm, 2X1X.1cm.   Wound bed: Both sites pink, dry, almost completely healed. Drainage (amount, consistency, odor) Scant yellow draiange, no odor. Dressing procedure/placement/frequency: Due to healing state of wounds, will leave Una boots off.  Foam dressing to protect and promote healing.  Pt can resume followup with outpatient wound care center after discharge. Will not plan to follow further unless re-consulted.  [address] RN, MSN, [employer]  [phone]

## 2011-08-23 NOTE — Progress Notes (Signed)
08/23/11 Nursing 2025 Notified Craige Cotta, NP by text page that patient's HR is in the low 50's on tele.  Patient is asleep in the recliner. Patient is asymptomatic.  No call returned. Will continue to monitor patient. Nelda Marseille, RN

## 2011-08-23 NOTE — Progress Notes (Signed)
  Echocardiogram 2D Echocardiogram has been performed.  Juanita Laster Baljit Liebert 08/23/2011, 2:47 PM

## 2011-08-23 NOTE — Progress Notes (Deleted)
Stroke Team Progress Note  HISTORY Mark French is an 66 y.o. male with past medical history significant for multiple medical problems including diabetes, hypertension, coronary artery disease, congestive heart failure. Per discussion with patient his wife and the neighbor. Patient was in the fair health when she left the morning of 08/22/2011 about 7:30 AM. He had breakfast with her. About 10:00 the neighbor who was supposed to do some work for the patient called on the phone without any response, he called again without any response he called patients wife at work then called 911. Patient was found sitting in his recliner unresponsive but breathing. On arrival of EMS patient with normal blood sugar normal blood pressure low temperature but unresponse with a blank stare. In the emergency department he was given Narcan IV fluids with no initial response to Narcan. Then about 2:30 patient became alert aware, altered mental status totally resolved and patient back to baseline. The last thing he remembers was this morning at breakfast. He is unsure of what happened after that. He did not lose control of his bladder or bowels did not bite his tongue. He does not have any chest pain no shortness of breath no nausea or vomiting. Normally he is able to walk around with a walker or a cane. He had a stroke when he was 37 months old and has some right-sided deficits secondary to that. He was evaluated by the stroke team who did not think the patient had a stroke. Head CT was negative. We were asked to admit patient for further evaluation.  Past Medical History  Diagnosis Date  . CORONARY ARTERY DISEASE 03/11/2007  . DIABETES MELLITUS, TYPE I 03/11/2007  . HYPERTENSION 03/11/2007  . BLINDNESS 05/21/2008  . Proliferative diabetic retinopathy 03/09/2009  . HYPERCHOLESTEROLEMIA 09/28/2008  . CERVICAL RADICULOPATHY, LEFT 09/23/2008  . ANEMIA-IRON DEFICIENCY 09/23/2008  . DEPRESSION 09/23/2008  . PERIPHERAL NEUROPATHY 09/23/2008   . MYOCARDIAL INFARCTION, HX OF 09/23/2008  . CONGESTIVE HEART FAILURE 09/23/2008  . RENAL INSUFFICIENCY 03/15/2010  . CEREBROVASCULAR ACCIDENT, HX OF 09/23/2008  . 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 Venous insufficiency of leg   . ED (erectile dysfunction)   . Charcot foot due to diabetes mellitus     chronic pain  . Hypogonadism male   . Congestive heart failure   . Duodenal ulcer 2012    multiple  . GI bleed   . Osteoarthritis    SUBJECTIVE   OBJECTIVE Most recent Vital Signs: Temp: 98.2 F (36.8 C) (01/16 0525) BP: 166/71 mmHg (01/16 0525) Pulse Rate: 62  (01/16 0525) Respiratory Rate: 18 O2 Saturation: 98%  CBG (last 3)  Basename 08/23/11 0740 08/22/11 2113 08/22/11 1850  GLUCAP 110* 140* 151*   Intake/Output from previous day: 01/15 0701 - 01/16 0700 In: -  Out: 200 [Urine:200]  IV Fluid Intake:    Medications    . aspirin  81 mg Oral Daily  . carvedilol  3.125 mg Oral BID WC  . docusate sodium  100 mg Oral BID  . enoxaparin  40 mg Subcutaneous Q24H  . gabapentin  300 mg Oral BID  . insulin aspart  0-15 Units Subcutaneous TID WC  . insulin NPH  16 Units Subcutaneous QAC breakfast  . losartan  50 mg Oral Daily  . naloxone (NARCAN) injection  0.4 mg Intravenous Once  . pantoprazole  40 mg Oral Q1200  PRN acetaminophen, acetaminophen, albuterol, alum & mag hydroxide-simeth, clotrimazole, furosemide, guaiFENesin-dextromethorphan, HYDROcodone-acetaminophen, ondansetron (ZOFRAN) IV,  ondansetron, sodium chloride  Diet:  Carb Control thin liquids Activity:   Up with assistance DVT Prophylaxis:  Lovenox 40 mg sq daily   Studies: CBC    Component Value Date/Time   WBC 9.6 08/23/2011 0525   RBC 4.66 08/23/2011 0525   HGB 12.9* 08/23/2011 0525   HCT 40.0 08/23/2011 0525   PLT 225 08/23/2011 0525   MCV 85.8 08/23/2011 0525   MCH 27.7 08/23/2011 0525   MCHC 32.3 08/23/2011 0525   RDW 14.2 08/23/2011 0525   LYMPHSABS 1.4 08/22/2011 1113    MONOABS 0.9 08/22/2011 1113   EOSABS 0.2 08/22/2011 1113   BASOSABS 0.0 08/22/2011 1113   CMP    Component Value Date/Time   NA 136 08/23/2011 0525   K 4.3 08/23/2011 0525   CL 101 08/23/2011 0525   CO2 27 08/23/2011 0525   GLUCOSE 133* 08/23/2011 0525   BUN 21 08/23/2011 0525   CREATININE 1.08 08/23/2011 0525   CALCIUM 9.2 08/23/2011 0525   CALCIUM 8.7 12/15/2010 1645   PROT 7.9 08/22/2011 2033   ALBUMIN 3.7 08/22/2011 2033   AST 63* 08/22/2011 2033   ALT 47 08/22/2011 2033   ALKPHOS 73 08/22/2011 2033   BILITOT 0.4 08/22/2011 2033   GFRNONAA 69* 08/23/2011 0525   GFRAA 80* 08/23/2011 0525   COAGS Lab Results  Component Value Date   INR 1.07 08/22/2011   INR 0.9 10/28/2008   Lipid Panel    Component Value Date/Time   CHOL 235* 07/19/2010 1626   TRIG 204.0* 07/19/2010 1626   HDL 40.40 07/19/2010 1626   CHOLHDL 6 07/19/2010 1626   VLDL 40.8* 07/19/2010 1626   LDLCALC  Value: 84        Total Cholesterol/HDL:CHD Risk Coronary Heart Disease Risk Table                     Men   Women  1/2 Average Risk   3.4   3.3  Average Risk       5.0   4.4  2 X Average Risk   9.6   7.1  3 X Average Risk  23.4   11.0        Use the calculated Patient Ratio above and the CHD Risk Table to determine the patient's CHD Risk.        ATP III CLASSIFICATION (LDL):  <100     mg/dL   Optimal  161-096  mg/dL   Near or Above                    Optimal  130-159  mg/dL   Borderline  045-409  mg/dL   High  >811     mg/dL   Very High 04/07/4781 9562   HgbA1C  Lab Results  Component Value Date   HGBA1C 6.9* 08/22/2011   Urine Drug Screen    Component Value Date/Time   LABOPIA POSITIVE* 08/22/2011 1539   COCAINSCRNUR NONE DETECTED 08/22/2011 1539   LABBENZ NONE DETECTED 08/22/2011 1539   AMPHETMU NONE DETECTED 08/22/2011 1539   THCU NONE DETECTED 08/22/2011 1539   LABBARB NONE DETECTED 08/22/2011 1539    Alcohol Level    Component Value Date/Time   ETH <11 08/22/2011 1202   CT of the brain  No acute intracranial abnormality.   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   MRI of the brain   Extensive chronic small vessel disease affecting the pons and hemispheric white matter.  Old  left parietal cortical and subcortical infarction.  Suspicion of a punctate acute infarction in the right dorsal pons. This is not absolutely certain.    MRA of the brain  Not ordered   2D Echocardiogram  ordered   Carotid Doppler  Not ordered   CXR  Cardiomegaly and mild interstitial edema.   EKG  ordered    Physical Exam  -----  ASSESSMENT Mr. Mark French is a 66 y.o. male with a questionable pontine infarct, secondary to small vessel disease . On aspirin 81 mg orally every day for secondary stroke prevention.  Stroke risk factors:  hyperlipidemia, hypertension and CAD, previous stroke  Hospital day # 1  TREATMENT/PLAN   Devyon Keator, AVNP, ANP-BC, GNP-BC for Dr. Aileen Pilot [facility] Pager: [phone] 08/23/2011 11:19 AM  Dr. Delia Heady, Stroke Center Medical Director, has personally reviewed chart, pertinent data, examined the patient and developed the plan of care.

## 2011-08-23 NOTE — Consult Note (Signed)
Reason for Consult altered mental status and stroke Referring Physician: Dr Barbarann Ehlers is an 66 y.o. male.  HPI: he presented with an abrupt onset of altered mental status. Patient was unable to provide clear history but his wife apparently left him at 7:30. His friend called him a couple of hours later and noticed patient was not right. The patient states that the last thing he remembers is that he was rearranging furniture. The next thing he remembers is coming to the hospital. The friend apparently came over and noticed that he was sitting with eyes open but staring and not speaking or following commands. There is apparently no focal extremity weakness numbness noted. There was no tongue bite injury a seizure activity noted. He has no known prior history of seizures or syncopal events. He does have history of left Mark stroke at age 80 months which has left him with mild right-sided weakness as well as right hemi-atrophy of his face and body. He at baseline was able to walk independently until 10 years ago when he had cardiac surgery and since then he has been walking with a cane.he takes baby aspirin day. He has multiple vascular is factors of diabetes, hypertension, coronary artery disease and, hyperlipidemia and obesity. He had MRI scan of the Mark done yesterday which shows a remote age infarct with encephalomalacia as well as tiny questionable area of the fusion positivity in the ventral right pons but there is no corresponding dark signal on the ADC map.  Past Medical History  Diagnosis Date  . CORONARY ARTERY DISEASE 03/11/2007  . DIABETES MELLITUS, TYPE I 03/11/2007  . HYPERTENSION 03/11/2007  . BLINDNESS 05/21/2008  . Proliferative diabetic retinopathy 03/09/2009  . HYPERCHOLESTEROLEMIA 09/28/2008  . CERVICAL RADICULOPATHY, LEFT 09/23/2008  . ANEMIA-IRON DEFICIENCY 09/23/2008  . DEPRESSION 09/23/2008  . PERIPHERAL NEUROPATHY 09/23/2008  . MYOCARDIAL INFARCTION, HX OF 09/23/2008  .  CONGESTIVE HEART FAILURE 09/23/2008  . RENAL INSUFFICIENCY 03/15/2010  . CEREBROVASCULAR ACCIDENT, HX OF 09/23/2008  . 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 Venous insufficiency of leg   . ED (erectile dysfunction)   . Charcot foot due to diabetes mellitus     chronic pain  . Hypogonadism male   . Congestive heart failure   . Duodenal ulcer 2012    multiple  . GI bleed   . Osteoarthritis     Past Surgical History  Procedure Date  . Coronary artery bypass graft   . Carpal tunnel release     left  . Wrist surgery     s/p right-bone graft fusion  . Spine surgery 2010    C spine surgery for disc Dr. Yetta Barre  . Sigmoidoscopy 03/11/2001  . Venous doppler 01/30/2004  . Cataract surgery   . Ankle surgery     left  . Foot surgery     left    Family History  Problem Relation Age of Onset  . Heart disease Mother     Coronary Artery Disease  . Heart disease Father     Coronary Artery Disease  . Heart disease Paternal Grandmother     Coronary Artery Disease  . Colon cancer Neg Hx   . Diabetes Other     Grandmother    Social History:  reports that he has never smoked. He has never used smokeless tobacco. He reports that he does not drink alcohol or use illicit drugs.  Allergies:  Allergies  Allergen Reactions  . Contrast Media (Iodinated  Diagnostic Agents) Nausea Only  . Sildenafil Nausea Only and Other (See Comments)    Medications: I have reviewed the patient's current medications.  Results for orders placed during the hospital encounter of 08/22/11 (from the past 48 hour(s))  POCT I-STAT, CHEM 8     Status: Abnormal   Collection Time   08/22/11 11:03 AM      Component Value Range Comment   Sodium 137  135 - 145 (mEq/L)    Potassium 5.1  3.5 - 5.1 (mEq/L)    Chloride 105  96 - 112 (mEq/L)    BUN 28 (*) 6 - 23 (mg/dL)    Creatinine, Ser 1.61 (*) 0.50 - 1.35 (mg/dL)    Glucose, Bld 096 (*) 70 - 99 (mg/dL)    Calcium, Ion 0.45  1.12 - 1.32  (mmol/L)    TCO2 27  0 - 100 (mmol/L)    Hemoglobin 15.6  13.0 - 17.0 (g/dL)    HCT 40.9  81.1 - 91.4 (%)   PROTIME-INR     Status: Normal   Collection Time   08/22/11 11:13 AM      Component Value Range Comment   Prothrombin Time 14.1  11.6 - 15.2 (seconds)    INR 1.07  0.00 - 1.49    APTT     Status: Normal   Collection Time   08/22/11 11:13 AM      Component Value Range Comment   aPTT 29  24 - 37 (seconds)   CBC     Status: Normal   Collection Time   08/22/11 11:13 AM      Component Value Range Comment   WBC 10.1  4.0 - 10.5 (K/uL)    RBC 5.04  4.22 - 5.81 (MIL/uL)    Hemoglobin 14.0  13.0 - 17.0 (g/dL)    HCT 78.2  95.6 - 21.3 (%)    MCV 87.3  78.0 - 100.0 (fL)    MCH 27.8  26.0 - 34.0 (pg)    MCHC 31.8  30.0 - 36.0 (g/dL)    RDW 08.6  57.8 - 46.9 (%)    Platelets 234  150 - 400 (K/uL)   DIFFERENTIAL     Status: Normal   Collection Time   08/22/11 11:13 AM      Component Value Range Comment   Neutrophils Relative 75  43 - 77 (%)    Neutro Abs 7.6  1.7 - 7.7 (K/uL)    Lymphocytes Relative 14  12 - 46 (%)    Lymphs Abs 1.4  0.7 - 4.0 (K/uL)    Monocytes Relative 9  3 - 12 (%)    Monocytes Absolute 0.9  0.1 - 1.0 (K/uL)    Eosinophils Relative 2  0 - 5 (%)    Eosinophils Absolute 0.2  0.0 - 0.7 (K/uL)    Basophils Relative 0  0 - 1 (%)    Basophils Absolute 0.0  0.0 - 0.1 (K/uL)   COMPREHENSIVE METABOLIC PANEL     Status: Abnormal   Collection Time   08/22/11 11:13 AM      Component Value Range Comment   Sodium 135  135 - 145 (mEq/L)    Potassium 5.2 (*) 3.5 - 5.1 (mEq/L)    Chloride 98  96 - 112 (mEq/L)    CO2 26  19 - 32 (mEq/L)    Glucose, Bld 229 (*) 70 - 99 (mg/dL)    BUN 26 (*) 6 - 23 (mg/dL)    Creatinine,  Ser 1.25  0.50 - 1.35 (mg/dL)    Calcium 9.5  8.4 - 10.5 (mg/dL)    Total Protein 7.9  6.0 - 8.3 (g/dL)    Albumin 3.7  3.5 - 5.2 (g/dL)    AST 38 (*) 0 - 37 (U/L)    ALT 53  0 - 53 (U/L)    Alkaline Phosphatase 90  39 - 117 (U/L)    Total Bilirubin 0.3   0.3 - 1.2 (mg/dL)    GFR calc non Af Amer 58 (*) >90 (mL/min)    GFR calc Af Amer 67 (*) >90 (mL/min)   CK TOTAL AND CKMB     Status: Abnormal   Collection Time   08/22/11 11:13 AM      Component Value Range Comment   Total CK 403 (*) 7 - 232 (U/L)    CK, MB 5.9 (*) 0.3 - 4.0 (ng/mL)    Relative Index 1.5  0.0 - 2.5    TROPONIN I     Status: Normal   Collection Time   08/22/11 11:13 AM      Component Value Range Comment   Troponin I <0.30  <0.30 (ng/mL)   GLUCOSE, CAPILLARY     Status: Abnormal   Collection Time   08/22/11 11:16 AM      Component Value Range Comment   Glucose-Capillary 208 (*) 70 - 99 (mg/dL)    Comment 1 Documented in Chart      Comment 2 Notify RN     CBC     Status: Normal   Collection Time   08/22/11 12:02 PM      Component Value Range Comment   WBC 10.2  4.0 - 10.5 (K/uL)    RBC 5.07  4.22 - 5.81 (MIL/uL)    Hemoglobin 14.0  13.0 - 17.0 (g/dL)    HCT 16.1  09.6 - 04.5 (%)    MCV 86.6  78.0 - 100.0 (fL)    MCH 27.6  26.0 - 34.0 (pg)    MCHC 31.9  30.0 - 36.0 (g/dL)    RDW 40.9  81.1 - 91.4 (%)    Platelets 240  150 - 400 (K/uL)   ETHANOL     Status: Normal   Collection Time   08/22/11 12:02 PM      Component Value Range Comment   Alcohol, Ethyl (B) <11  0 - 11 (mg/dL)   GLUCOSE, CAPILLARY     Status: Abnormal   Collection Time   08/22/11 12:20 PM      Component Value Range Comment   Glucose-Capillary 213 (*) 70 - 99 (mg/dL)    Comment 1 Documented in Chart      Comment 2 Notify RN     LACTIC ACID, PLASMA     Status: Normal   Collection Time   08/22/11  1:03 PM      Component Value Range Comment   Lactic Acid, Venous 2.1  0.5 - 2.2 (mmol/L)   ACETAMINOPHEN LEVEL     Status: Normal   Collection Time   08/22/11  1:03 PM      Component Value Range Comment   Acetaminophen (Tylenol), Serum <15.0  10 - 30 (ug/mL)   URINALYSIS, ROUTINE W REFLEX MICROSCOPIC     Status: Abnormal   Collection Time   08/22/11  3:39 PM      Component Value Range Comment    Color, Urine YELLOW  YELLOW     APPearance CLEAR  CLEAR  Specific Gravity, Urine 1.015  1.005 - 1.030     pH 6.5  5.0 - 8.0     Glucose, UA NEGATIVE  NEGATIVE (mg/dL)    Hgb urine dipstick NEGATIVE  NEGATIVE     Bilirubin Urine NEGATIVE  NEGATIVE     Ketones, ur NEGATIVE  NEGATIVE (mg/dL)    Protein, ur 30 (*) NEGATIVE (mg/dL)    Urobilinogen, UA 0.2  0.0 - 1.0 (mg/dL)    Nitrite NEGATIVE  NEGATIVE     Leukocytes, UA NEGATIVE  NEGATIVE    URINE RAPID DRUG SCREEN (HOSP PERFORMED)     Status: Abnormal   Collection Time   08/22/11  3:39 PM      Component Value Range Comment   Opiates POSITIVE (*) NONE DETECTED     Cocaine NONE DETECTED  NONE DETECTED     Benzodiazepines NONE DETECTED  NONE DETECTED     Amphetamines NONE DETECTED  NONE DETECTED     Tetrahydrocannabinol NONE DETECTED  NONE DETECTED     Barbiturates NONE DETECTED  NONE DETECTED    URINE MICROSCOPIC-ADD ON     Status: Normal   Collection Time   08/22/11  3:39 PM      Component Value Range Comment   Squamous Epithelial / LPF RARE  RARE    GLUCOSE, CAPILLARY     Status: Abnormal   Collection Time   08/22/11  6:50 PM      Component Value Range Comment   Glucose-Capillary 151 (*) 70 - 99 (mg/dL)   CBC     Status: Abnormal   Collection Time   08/22/11  8:33 PM      Component Value Range Comment   WBC 11.0 (*) 4.0 - 10.5 (K/uL)    RBC 4.86  4.22 - 5.81 (MIL/uL)    Hemoglobin 14.0  13.0 - 17.0 (g/dL)    HCT 40.9  81.1 - 91.4 (%)    MCV 85.6  78.0 - 100.0 (fL)    MCH 28.8  26.0 - 34.0 (pg)    MCHC 33.7  30.0 - 36.0 (g/dL)    RDW 78.2  95.6 - 21.3 (%)    Platelets 248  150 - 400 (K/uL)   CREATININE, SERUM     Status: Abnormal   Collection Time   08/22/11  8:33 PM      Component Value Range Comment   Creatinine, Ser 1.01  0.50 - 1.35 (mg/dL)    GFR calc non Af Amer 75 (*) >90 (mL/min)    GFR calc Af Amer 87 (*) >90 (mL/min)   HEPATIC FUNCTION PANEL     Status: Abnormal   Collection Time   08/22/11  8:33 PM       Component Value Range Comment   Total Protein 7.9  6.0 - 8.3 (g/dL)    Albumin 3.7  3.5 - 5.2 (g/dL)    AST 63 (*) 0 - 37 (U/L) HEMOLYSIS AT THIS LEVEL MAY AFFECT RESULT   ALT 47  0 - 53 (U/L) HEMOLYSIS AT THIS LEVEL MAY AFFECT RESULT   Alkaline Phosphatase 73  39 - 117 (U/L) HEMOLYSIS AT THIS LEVEL MAY AFFECT RESULT   Total Bilirubin 0.4  0.3 - 1.2 (mg/dL)    Bilirubin, Direct <0.8  0.0 - 0.3 (mg/dL) REPEATED TO VERIFY   Indirect Bilirubin NOT CALCULATED  0.3 - 0.9 (mg/dL)   HEMOGLOBIN M5H     Status: Abnormal   Collection Time   08/22/11  8:33 PM  Component Value Range Comment   Hemoglobin A1C 6.9 (*) <5.7 (%)    Mean Plasma Glucose 151 (*) <117 (mg/dL)   GLUCOSE, CAPILLARY     Status: Abnormal   Collection Time   08/22/11  9:13 PM      Component Value Range Comment   Glucose-Capillary 140 (*) 70 - 99 (mg/dL)    Comment 1 Notify RN     BASIC METABOLIC PANEL     Status: Abnormal   Collection Time   08/23/11  5:25 AM      Component Value Range Comment   Sodium 136  135 - 145 (mEq/L)    Potassium 4.3  3.5 - 5.1 (mEq/L)    Chloride 101  96 - 112 (mEq/L)    CO2 27  19 - 32 (mEq/L)    Glucose, Bld 133 (*) 70 - 99 (mg/dL)    BUN 21  6 - 23 (mg/dL)    Creatinine, Ser 1.61  0.50 - 1.35 (mg/dL)    Calcium 9.2  8.4 - 10.5 (mg/dL)    GFR calc non Af Amer 69 (*) >90 (mL/min)    GFR calc Af Amer 80 (*) >90 (mL/min)   CBC     Status: Abnormal   Collection Time   08/23/11  5:25 AM      Component Value Range Comment   WBC 9.6  4.0 - 10.5 (K/uL)    RBC 4.66  4.22 - 5.81 (MIL/uL)    Hemoglobin 12.9 (*) 13.0 - 17.0 (g/dL)    HCT 09.6  04.5 - 40.9 (%)    MCV 85.8  78.0 - 100.0 (fL)    MCH 27.7  26.0 - 34.0 (pg)    MCHC 32.3  30.0 - 36.0 (g/dL)    RDW 81.1  91.4 - 78.2 (%)    Platelets 225  150 - 400 (K/uL)   GLUCOSE, CAPILLARY     Status: Abnormal   Collection Time   08/23/11  7:40 AM      Component Value Range Comment   Glucose-Capillary 110 (*) 70 - 99 (mg/dL)   GLUCOSE, CAPILLARY      Status: Abnormal   Collection Time   08/23/11 12:05 PM      Component Value Range Comment   Glucose-Capillary 207 (*) 70 - 99 (mg/dL)     Ct Head French Contrast  08/22/2011  *RADIOLOGY REPORT*  Clinical Data: Sudden onset of altered mental status.  Confusion.  CT HEAD WITHOUT CONTRAST  Technique:  Contiguous axial images were obtained from the base of the skull through the vertex without contrast.  Comparison: None.  Findings: No acute intracranial hemorrhage, infarction, or mass lesion.  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  No acute osseous abnormality.  IMPRESSION: No acute intracranial abnormality.  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  Original Report Authenticated By: Gwynn Burly, M.D.   Mr Mark French Contrast  08/23/2011  *RADIOLOGY REPORT*  Clinical Data: Altered mental status.  Diabetes and hypertension.  MRI HEAD WITHOUT CONTRAST  Technique:  Multiplanar, multiecho pulse sequences of the Mark and surrounding structures were obtained according to standard protocol without intravenous contrast.  Comparison: Head CT 01/15  Findings: There is extensive chronic appearing small vessel disease throughout the pons.  There is question of a punctate acute infarction within the right pons.  This is not definite.  No other focus of suspected acute infarction.  No focal cerebellar infarction.  The cerebral hemispheres show chronic small vessel  disease affecting the deep and subcortical white matter with an old left parietal cortical and subcortical infarction that has progressed atrophy encephalomalacia.  No mass lesion, hemorrhage, hydrocephalus or extra-axial collection.  No pituitary mass.  No inflammatory sinus disease.  No skull or skull base lesion.  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  Old left parietal cortical and subcortical infarction.  Suspicion of a punctate acute  infarction in the right dorsal pons. This is not absolutely certain.  Original Report Authenticated By: Thomasenia Sales, M.D.   Dg Chest Port 1 View  08/22/2011  *RADIOLOGY REPORT*  Clinical Data: Altered level of consciousness.  PORTABLE CHEST - 1 VIEW  Comparison: Chest 11/30/2010.  Findings: There is cardiomegaly.  The patient there is status post CABG.  Mild interstitial edema is present.  No pneumothorax.  IMPRESSION: Cardiomegaly and mild interstitial edema.  Original Report Authenticated By: Bernadene Bell. D'ALESSIO, M.D.       ROS as documented in admission history and physical. No chest pain, cough, diarrhea, shortness of breath or we will Blood pressure 166/71, pulse 62, temperature 98.2 F (36.8 C), temperature source Oral, resp. rate 18, height 5\' 8"  (1.727 m), weight 105.053 kg (231 lb 9.6 oz), SpO2 98.00%.  PHYSICAL EXAM  A pleasant obese middle-aged Caucasian gentleman sitting in a chair eating lunch. Afebrile. Head is not dramatic. Neck is supple soft carotid bruit. Hearing is diminished bilaterally. Cardiac exam soft ejection murmur. No gallop. Lungs are clear to auscultation. Distal pulses were felt with 1 plus  pedal edema.there is hemiatrophy of the right face and upper extremity and minimum lower extremity Neurological exam awake alert oriented to time place and person. Diminished recall 1/3 and attention. Intact comprehension, repetition, naming and calculations. Speech appears normal. No dysarthria. Extra ocular movements are full range without nystagmus. Blinks to threat bilaterally. There is mild right nasolabial fold of symmetry. There is mild spastic right hemiparesis with finger flexion and wrist contractures on the right. There is 4/5 strength on the right side. There is mild weakness of the right hip flexors and ankle dorsiflexors. Tone is increased on the right side. Reflexes are brisker on the right compared to the left. Right plantar is upgoing. Left is downgoing. Sensation is  diminished on the right side. Gait was not tested. Assessment/Plan:   13 her gentleman with episode off altered consciousness of unclear etiology. Possibilities include unwitnessed seizure with postictal confusion versus  syncope. I doubt these symptoms can be explained by the tiny illefined diffusion-positive lesion seen on his MRI. This lesion is likely a silent age indeterminate small vessel disease infarct. He certainly has multiple risk factors for small vessel disease and strokes.  Plan : Recommend increase aspirin to 325 mg a day. Check carotid ultrasound, Transcranial Dopplers, transthoracic echocardiogram and therapy consults .Stroke Team We will follow. kindly call for questions. Discuss with Marissa Calamity, nurse practitionerTri[facility] hospitalists team. Gates Rigg 08/23/2011, 1:25 PM

## 2011-08-23 NOTE — Progress Notes (Signed)
Speech Language/Pathology Clinical/Bedside Swallow Evaluation Patient Details  Name: Mark French MRN: [id number] DOB: 1946/04/27 Today's Date: 08/23/2011  Past Medical History:  Past Medical History  Diagnosis Date  . CORONARY ARTERY DISEASE 03/11/2007  . DIABETES MELLITUS, TYPE I 03/11/2007  . HYPERTENSION 03/11/2007  . BLINDNESS 05/21/2008  . Proliferative diabetic retinopathy 03/09/2009  . HYPERCHOLESTEROLEMIA 09/28/2008  . CERVICAL RADICULOPATHY, LEFT 09/23/2008  . ANEMIA-IRON DEFICIENCY 09/23/2008  . DEPRESSION 09/23/2008  . PERIPHERAL NEUROPATHY 09/23/2008  . MYOCARDIAL INFARCTION, HX OF 09/23/2008  . CONGESTIVE HEART FAILURE 09/23/2008  . RENAL INSUFFICIENCY 03/15/2010  . CEREBROVASCULAR ACCIDENT, HX OF 09/23/2008  . 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 Venous insufficiency of leg   . ED (erectile dysfunction)   . Charcot foot due to diabetes mellitus     chronic pain  . Hypogonadism male   . Congestive heart failure   . Duodenal ulcer 2012    multiple  . GI bleed   . Osteoarthritis    Past Surgical History:  Past Surgical History  Procedure Date  . Coronary artery bypass graft   . Carpal tunnel release     left  . Wrist surgery     s/p right-bone graft fusion  . Spine surgery 2010    C spine surgery for disc Dr. Yetta Barre  . Sigmoidoscopy 03/11/2001  . Venous doppler 01/30/2004  . Cataract surgery   . Ankle surgery     left  . Foot surgery     left   HPI:  Pt is a 66 year old male admitted with period of unresponsiveness with hypothermia. Pt suspected to have had TIA though MRI shows suspicion of punctate infarct in right pons. Pt/family report no difficutly swallowing.    Assessment/Recommendations/Treatment Plan    SLP Assessment Clinical Impression Statement: Pt presents with overall functional swallow though pt did demonstrate hard cough with excessively large straw sip of water. Suspect mild delay in swallow initiation that could lead  to penetration aspiraiton with large sips. Precautions reviewed with pt/family who repeated and return demonstrated. Pt may continue regular diet with thin liquids.  Risk for Aspiration: Mild  Swallow Evaluation Recommendations Solid Consistency: Regular Liquid Consistency: Thin Liquid Administration via: No straw;Cup Medication Administration: Whole meds with puree Supervision: Patient able to self feed Compensations: Slow rate;Small sips/bites Postural Changes and/or Swallow Maneuvers: Out of bed for meals;Seated upright 90 degrees Oral Care Recommendations: Oral care BID Follow up Recommendations: None  Treatment Plan Treatment Plan Recommendations: Therapy as outlined in treatment plan below Speech Therapy Frequency: min 1 x/week Treatment Duration: 1 week Interventions: Aspiration precaution training;Patient/family education;Diet toleration management by SLP     Individuals Consulted Consulted and Agree with Results and Recommendations: Patient;Family member/caregiver Family Member Consulted: wife  Swallowing Goals  SLP Swallowing Goals Patient will consume recommended diet without observed clinical signs of aspiration with: Independent assistance Swallow Study Goal #1 - Progress: Progressing toward goal Patient will utilize recommended strategies during swallow to increase swallowing safety with: Minimal cueing Swallow Study Goal #2 - Progress: Progressing toward goal  [facility], MA CCC-SLP [phone] Mark French 08/23/2011,4:40 PM

## 2011-08-23 NOTE — Progress Notes (Signed)
*  PRELIMINARY RESULTS* TCD completed .  [name],RVS , [place] D 08/23/2011, 3:42 PM

## 2011-08-23 NOTE — Progress Notes (Signed)
Patient ID: Mark French, male   DOB: 1946-06-07, 66 y.o.   MRN: [id number] Subjective: I slept terrible in the bed last night, I normally sleep in a recliner because of my hiatal hernia.  Otherwise I feel back to normal.  Patient reports he is very careful with his pain medications and could not have accidentally over taken them.  Objective: Weight change:   Intake/Output Summary (Last 24 hours) at 08/23/11 1111 Last data filed at 08/22/11 2000  Gross per 24 hour  Intake      0 ml  Output    200 ml  Net   -200 ml   Blood pressure 166/71, pulse 62, temperature 98.2 F (36.8 C), temperature source Oral, resp. rate 18, height 5\' 8"  (1.727 m), weight 105.053 kg (231 lb 9.6 oz), SpO2 98.00%. Filed Vitals:   08/22/11 1853 08/22/11 1900 08/22/11 2100 08/23/11 0525  BP: 160/73  158/52 166/71  Pulse: 64  66 62  Temp: 97.6 F (36.4 C)  97.6 F (36.4 C) 98.2 F (36.8 C)  TempSrc: Oral  Oral   Resp: 20  17 18   Height:  5\' 8"  (1.727 m)    Weight:  105.053 kg (231 lb 9.6 oz)    SpO2: 99%  99% 98%    Physical Exam: General: No acute distress, appears worn out.  No teeth. Lungs: Clear to auscultation bilaterally without wheezes or crackles Cardiovascular: Regular rate and rhythm without murmur gallop or rub normal S1 and S2 Abdomen: Nontender, nondistended, soft, bowel sounds positive, no rebound, no ascites, no appreciable mass Extremities: Chronic wounds and edema in lower extremities.  Seen and evaluated by wound care.   Neuro:  Limited dexterity is right hand - chronic.  Otherwise exam is non focal. Skin:  Redness under pannus appears to be yeast.  Basic Metabolic Panel:  Lab 08/23/11 4098 08/22/11 2033 08/22/11 1113  NA 136 -- 135  K 4.3 -- 5.2*  CL 101 -- 98  CO2 27 -- 26  GLUCOSE 133* -- 229*  BUN 21 -- 26*  CREATININE 1.08 1.01 --  CALCIUM 9.2 -- 9.5  MG -- -- --  PHOS -- -- --   Liver Function Tests:  Lab 08/22/11 2033 08/22/11 1113  AST 63* 38*  ALT 47 53    ALKPHOS 73 90  BILITOT 0.4 0.3  PROT 7.9 7.9  ALBUMIN 3.7 3.7   CBC:  Lab 08/23/11 0525 08/22/11 2033 08/22/11 1113  WBC 9.6 11.0* --  NEUTROABS -- -- 7.6  HGB 12.9* 14.0 --  HCT 40.0 41.6 --  MCV 85.8 85.6 --  PLT 225 248 --   Cardiac Enzymes:  Lab 08/22/11 1113  CKTOTAL 403*  CKMB 5.9*  CKMBINDEX --  TROPONINI <0.30   BNP: No components found with this basename: POCBNP:3 D-Dimer: No results found for this basename: DDIMER:2 in the last 168 hours CBG:  Lab 08/23/11 0740 08/22/11 2113 08/22/11 1850 08/22/11 1220 08/22/11 1116  GLUCAP 110* 140* 151* 213* 208*   Hemoglobin A1C:  Lab 08/22/11 2033  HGBA1C 6.9*   Coagulation:  Lab 08/22/11 1113  LABPROT 14.1  INR 1.07   Urine Drug Screen: Drugs of Abuse     Component Value Date/Time   LABOPIA POSITIVE* 08/22/2011 1539   COCAINSCRNUR NONE DETECTED 08/22/2011 1539   LABBENZ NONE DETECTED 08/22/2011 1539   AMPHETMU NONE DETECTED 08/22/2011 1539   THCU NONE DETECTED 08/22/2011 1539   LABBARB NONE DETECTED 08/22/2011 1539    Lab 08/22/11 1202  ETH <11   Studies/Results: Scheduled Meds:   . aspirin  81 mg Oral Daily  . carvedilol  3.125 mg Oral BID WC  . docusate sodium  100 mg Oral BID  . enoxaparin  40 mg Subcutaneous Q24H  . gabapentin  300 mg Oral BID  . insulin aspart  0-15 Units Subcutaneous TID WC  . insulin NPH  16 Units Subcutaneous QAC breakfast  . losartan  50 mg Oral Daily  . naloxone (NARCAN) injection  0.4 mg Intravenous Once  . pantoprazole  40 mg Oral Q1200   Continuous Infusions:  PRN Meds:.acetaminophen, acetaminophen, albuterol, alum & mag hydroxide-simeth, clotrimazole, furosemide, guaiFENesin-dextromethorphan, HYDROcodone-acetaminophen, ondansetron (ZOFRAN) IV, ondansetron, sodium chloride  Assessment/Plan: Active Problems:  DIABETES MELLITUS, TYPE I  ANEMIA-IRON DEFICIENCY  PERIPHERAL NEUROPATHY  HYPERTENSION  CONGESTIVE HEART FAILURE  RENAL INSUFFICIENCY  ULCER, LEG   CERVICAL RADICULOPATHY, LEFT  CEREBROVASCULAR ACCIDENT, HX OF  HYPERKALEMIA  Altered mental status  Hypothermia   Assessment & Plan:   Altered mental status   Altered mental status: With history of CVA.  He is back to baseline. Without any new neurologic deficit. MRI Complete. Continue 81 mg ASA. Have consulted Neuro to comment on the results and make recommendations.   Likely TIA vs Tiny stroke.   2 D echo and EEG still pending.  Will proceed with rest of TIA work up as well.  Hypertension: Blood pressure remains slightly elevated. Permissive hypertension after possible TIA/Stroke.  Will monitor.  DIABETES MELLITUS, TYPE I:  CBGs 135, 110. will continue him on NPH and sliding scale insulin.  are presently stable.   ANEMIA-IRON DEFICIENCY Hemoglobin is normal. Monitor   PERIPHERAL NEUROPATHY on Gabapentin  CONGESTIVE HEART FAILURE: Will check 2-D echo. Patient does not seem to be in active failure. Continue when necessary Lasix. Check BNP  Hx of RENAL INSUFFICIENCY:  Creatinine is stable.   ULCER, LEG:  [facility]  Seen by wound care in house.  They have made recommendations and signed off  CERVICAL RADICULOPATHY, LEFT:  When necessary pain medication.   HYPERKALEMIA:  Mildly elevated. Monitor for now.  Was 4.3 at 5 am 08/23/11.  HYPOTHERMIA:  Resolved. Not sure why he was hypothermic. Neuro?  YEAST Infection of the Pannus:  Will order Nystatin Powder.  Dispo:  To home as soon as work up and recommendations are complete.  1/17?    LOS: 1 day   Stephani Police 08/23/2011, 11:11 AM [phone]

## 2011-08-23 NOTE — Progress Notes (Signed)
Chart reviewed. Patient interviewed and examined. Discussed with Ms. Algis Downs, PA and agree with her notes.  Patient says that he's feeling significantly better than on admission. He denies any specific complaints at this time.  Altered mental status of unclear etiology: Differential diagnosis include syncope versus seizure with postictal state. Neurology does not believe this is related to the ill defined diffusion-positive lesion seen on MRI of the head. They've increased his aspirin to 325 mg by mouth daily. Echo shows left ventricle ejection fraction of 50-55% and infero-basal wall hypokinesis. Transcranial Doppler has been done and results are pending. Carotid Dopplers are pending. We'll order EEG. Mental status change possibly resolved.   Mark French 6:45 PM

## 2011-08-23 NOTE — Progress Notes (Signed)
Utilization Review Completed.Mark French T1/16/2013   

## 2011-08-24 LAB — BASIC METABOLIC PANEL
GFR calc non Af Amer: 59 mL/min — ABNORMAL LOW (ref >90)
Glucose, Bld: 86 mg/dL (ref 70–99)
Potassium: 4.3 mEq/L (ref 3.5–5.1)
Sodium: 136 mEq/L (ref 135–145)

## 2011-08-24 LAB — GLUCOSE, CAPILLARY: Glucose-Capillary: 154 mg/dL — ABNORMAL HIGH (ref 70–99)

## 2011-08-24 MED ORDER — ASPIRIN 325 MG PO TABS: 325.0000 mg | ORAL_TABLET | Freq: Every day | ORAL | Status: DC

## 2011-08-24 NOTE — Procedures (Signed)
HISTORY:  This is a 66 year old man who is referred for possible syncope.  MEDICATIONS:  No anticonvulsive medications.  CONDITION OF RECORDING:  This 16-lead EEG was recorded with the patient in awake, drowsy, and sleep states.  Background rhythms: background patterns were well-organized with a well-sustained posterior dominant rhythm of 9 Hz, symmetrical and reactive to eye opening and closing. Drowsiness was associated with mild attenuation of voltage and slowing of frequencies.  Normal sleep patterns were seen.  Abnormal potentials: no epileptiform activity or focal slowing was noted.  ACTIVATION PROCEDURES:  Hyperventilation was not performed. Photic stimulation was not performed.  EKG:  Single channel of EKG monitoring detected bradycardia and an irregular rhythm.  IMPRESSION:  This is a normal awake, drowsy, and sleep EEG.  A normal EEG does not rule out the clinical diagnosis of epilepsy.  If clinically warranted, a repeat extended EEG or ambulatory requiring may be obtained for prolonged recording times, which may increase diagnostic yield.  A single-channel of EKG monitoring detected bradycardia and an irregular rhythm.  If clinically warranted, clinical correlation was suggested.          ______________________________ Carmell Austria, MD    [id number] D:  08/23/2011 17:40:50  T:  08/24/2011 00:26:52  Job #:  [id number]

## 2011-08-24 NOTE — Progress Notes (Signed)
Stroke Team Progress Note   SUBJECTIVE Continuous improve cognitively  and has no complaints today.no further episodes of confusion or seizures since admission  OBJECTIVE Most recent Vital Signs: Temp: 97.4 F (36.3 C) (01/17 0513) Temp src: Oral (01/17 0513) BP: 139/61 mmHg (01/17 0513) Pulse Rate: 56  (01/17 0513) Respiratory Rate: 18 O2 Saturation: 100%  CBG (last 3)  Basename 08/24/11 0755 08/23/11 2159 08/23/11 1641  GLUCAP 94 109* 88   Intake/Output from previous day: 01/16 0701 - 01/17 0700 In: 1040 [P.O.:1040] Out: 1150 [Urine:1150]  IV Fluid Intake:    Medications    . aspirin  325 mg Oral Daily  . carvedilol  3.125 mg Oral BID WC  . docusate sodium  100 mg Oral BID  . enoxaparin  40 mg Subcutaneous Q24H  . gabapentin  300 mg Oral BID  . insulin aspart  0-15 Units Subcutaneous TID WC  . insulin NPH  16 Units Subcutaneous QAC breakfast  . losartan  50 mg Oral Daily  . nystatin   Topical TID  . pantoprazole  40 mg Oral Q1200  . DISCONTD: aspirin  81 mg Oral Daily  PRN acetaminophen, acetaminophen, albuterol, alum & mag hydroxide-simeth, clotrimazole, furosemide, guaiFENesin-dextromethorphan, HYDROcodone-acetaminophen, ondansetron (ZOFRAN) IV, ondansetron, sodium chloride  Studies: CBC    Component Value Date/Time   WBC 9.6 08/23/2011 0525   RBC 4.66 08/23/2011 0525   HGB 12.9* 08/23/2011 0525   HCT 40.0 08/23/2011 0525   PLT 225 08/23/2011 0525   MCV 85.8 08/23/2011 0525   MCH 27.7 08/23/2011 0525   MCHC 32.3 08/23/2011 0525   RDW 14.2 08/23/2011 0525   LYMPHSABS 1.4 08/22/2011 1113   MONOABS 0.9 08/22/2011 1113   EOSABS 0.2 08/22/2011 1113   BASOSABS 0.0 08/22/2011 1113   CMP    Component Value Date/Time   NA 136 08/24/2011 0625   K 4.3 08/24/2011 0625   CL 100 08/24/2011 0625   CO2 27 08/24/2011 0625   GLUCOSE 86 08/24/2011 0625   BUN 21 08/24/2011 0625   CREATININE 1.23 08/24/2011 0625   CALCIUM 9.4 08/24/2011 0625   CALCIUM 8.7 12/15/2010 1645   PROT 7.9  08/22/2011 2033   ALBUMIN 3.7 08/22/2011 2033   AST 63* 08/22/2011 2033   ALT 47 08/22/2011 2033   ALKPHOS 73 08/22/2011 2033   BILITOT 0.4 08/22/2011 2033   GFRNONAA 59* 08/24/2011 0625   GFRAA 68* 08/24/2011 0625   COAGS Lab Results  Component Value Date   INR 1.07 08/22/2011   INR 0.9 10/28/2008   Lipid Panel    Component Value Date/Time   CHOL 235* 07/19/2010 1626   TRIG 204.0* 07/19/2010 1626   HDL 40.40 07/19/2010 1626   CHOLHDL 6 07/19/2010 1626   VLDL 40.8* 07/19/2010 1626   LDLCALC  Value: 84        Total Cholesterol/HDL:CHD Risk Coronary Heart Disease Risk Table                     Men   Women  1/2 Average Risk   3.4   3.3  Average Risk       5.0   4.4  2 X Average Risk   9.6   7.1  3 X Average Risk  23.4   11.0        Use the calculated Patient Ratio above and the CHD Risk Table to determine the patient's CHD Risk.        ATP III CLASSIFICATION (LDL):  <100  mg/dL   Optimal  161-096  mg/dL   Near or Above                    Optimal  130-159  mg/dL   Borderline  045-409  mg/dL   High  >811     mg/dL   Very High 04/07/4781 9562   HgbA1C  Lab Results  Component Value Date   HGBA1C 6.9* 08/22/2011   Urine Drug Screen    Component Value Date/Time   LABOPIA POSITIVE* 08/22/2011 1539   COCAINSCRNUR NONE DETECTED 08/22/2011 1539   LABBENZ NONE DETECTED 08/22/2011 1539   AMPHETMU NONE DETECTED 08/22/2011 1539   THCU NONE DETECTED 08/22/2011 1539   LABBARB NONE DETECTED 08/22/2011 1539    Alcohol Level    Component Value Date/Time   ETH <11 08/22/2011 1202   Ct Head Wo Contrast  08/22/2011  *RADIOLOGY REPORT*  Clinical Data: Sudden onset of altered mental status.  Confusion.  CT HEAD WITHOUT CONTRAST  Technique:  Contiguous axial images were obtained from the base of the skull through the vertex without contrast.  Comparison: None.  Findings: No acute intracranial hemorrhage, infarction, or mass lesion.  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  No acute  osseous abnormality.  IMPRESSION: No acute intracranial abnormality.  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  Original Report Authenticated By: Gwynn Burly, M.D.   Mr Brain Wo Contrast  08/23/2011  *RADIOLOGY REPORT*  Clinical Data: Altered mental status.  Diabetes and hypertension.  MRI HEAD WITHOUT CONTRAST  Technique:  Multiplanar, multiecho pulse sequences of the brain and surrounding structures were obtained according to standard protocol without intravenous contrast.  Comparison: Head CT 01/15  Findings: There is extensive chronic appearing small vessel disease throughout the pons.  There is question of a punctate acute infarction within the right pons.  This is not definite.  No other focus of suspected acute infarction.  No focal cerebellar infarction.  The cerebral hemispheres show chronic small vessel disease affecting the deep and subcortical white matter with an old left parietal cortical and subcortical infarction that has progressed atrophy encephalomalacia.  No mass lesion, hemorrhage, hydrocephalus or extra-axial collection.  No pituitary mass.  No inflammatory sinus disease.  No skull or skull base lesion.  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  Old left parietal cortical and subcortical infarction.  Suspicion of a punctate acute infarction in the right dorsal pons. This is not absolutely certain.  Original Report Authenticated By: Thomasenia Sales, M.D.   Dg Chest Port 1 View  08/22/2011  *RADIOLOGY REPORT*  Clinical Data: Altered level of consciousness.  PORTABLE CHEST - 1 VIEW  Comparison: Chest 11/30/2010.  Findings: There is cardiomegaly.  The patient there is status post CABG.  Mild interstitial edema is present.  No pneumothorax.  IMPRESSION: Cardiomegaly and mild interstitial edema.  Original Report Authenticated By: Bernadene Bell. Maricela Curet, M.D.   Physical Exam  A pleasant obese middle-aged  Caucasian gentleman sitting in a chair eating lunch. Afebrile. Head is not dramatic. Neck is supple soft carotid bruit. Hearing is diminished bilaterally. Cardiac exam soft ejection murmur. No gallop. Lungs are clear to auscultation. Distal pulses were felt with 1 plus pedal edema.there is hemiatrophy of the right face and upper extremity and minimum lower extremity  Neurological exam awake alert oriented to time place and person. Diminished recall 1/3 and attention. Intact comprehension, repetition, naming and calculations. Speech appears  normal. No dysarthria. Extra ocular movements are full range without nystagmus. Blinks to threat bilaterally. There is mild right nasolabial fold of symmetry. There is mild spastic right hemiparesis with finger flexion and wrist contractures on the right. There is 4/5 strength on the right side. There is mild weakness of the right hip flexors and ankle dorsiflexors. Tone is increased on the right side. Reflexes are brisker on the right compared to the left. Right plantar is upgoing. Left is downgoing. Sensation is diminished on the right side. Gait was not tested.  ASSESSMENT Mr. Mark French is a 66 y.o. male with episode off altered consciousness of unclear etiology. Possibilities include unwitnessed seizure with postictal confusion versus syncope. I doubt these symptoms can be explained by the tiny illefined diffusion-positive lesion seen on his MRI. This lesion is likely a silent age indeterminate small vessel disease infarct. On aspirin 325 mg orally every day for secondary stroke prevention. Unable to get TEE in a timely manner, ok to do as an OP.  Hospital day # 2  TREATMENT/PLAN Continue aspirin 325 mg orally every day for secondary stroke prevention. Agree with plans for discharge.EEG as an outpatient as the hospital his backup for 3 days to get an EEG. Will sign off. Follow up with Dr. Pearlean Brownie in 2 months.  Joaquin Music, ANP-BC, GNP-BC [facility] Pager: [phone] 08/24/2011 9:43 AM  Dr. Delia Heady, Stroke Center Medical Director, has personally reviewed chart, pertinent data, examined the patient and developed the plan of care.

## 2011-08-24 NOTE — Progress Notes (Signed)
*  PRELIMINARY RESULTS*   Carotid Dopplers completed.  Right: no significant ICA stenosis.  Left: 40-60% ICA stenosis, highest end of range.  Bilateral vertebral artery flow is antegrade.   Sherren Kerns Renee 08/24/2011, 9:20 AM

## 2011-08-24 NOTE — Progress Notes (Signed)
Education provided regarding s/s of CVA, when to alert EMS/MD.  Has good support at home, pt has difficulty seeing and somewhat with mobility.  Bonney Leitz RN

## 2011-08-24 NOTE — Progress Notes (Signed)
Speech Language/Pathology Speech Pathology: Dysphagia Treatment Note  Patient was observed with :Thin liquids.  Patient was noted to have s/s of aspiration : No:    Lung Sounds:  Diminished  Temperature: afebrile  Patient independently stated safe swallow strategies, reported he planned to be careful about eating int he future, preparing for d/c. SLP provided trials of thin liquids with no s/s of aspiration observed. Min verbal cues needed for small sips. Provided further education to pt.   Clinical Impression: Pt tolerating diet, no s/s of aspiration. Education for aspiration precautions complete. No f/u needed.   Recommendations:  Continue current diet, no f/u.   Pain:   none Intervention Required:   Yes   Goals: All Goals Met  Mark Ditty, MA CCC-SLP [phone]

## 2011-08-24 NOTE — Discharge Summary (Signed)
Patient ID: Mark French MRN: [id number] DOB/AGE: [birth year] 66 y.o.  Admit date: 08/22/2011 Discharge date: 08/24/2011  Primary Care Physician:  [name], MD, MD  Discharge Diagnoses:   Present on Admission:  .Altered mental status -resolved, of uncertain etiology, syncope versus unwitnessed seizure with postictal state  .Hypothermia -resolved.  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 Residual from stroke at two months of age.   Current Discharge Medication List    CONTINUE these medications which have CHANGED   Details  aspirin 325 MG tablet Take 1 tablet (325 mg total) by mouth daily.      CONTINUE these medications which have NOT CHANGED   Details  carvedilol (COREG) 3.125 MG tablet Take 3.125 mg by mouth 2 (two) times daily with a meal.      clotrimazole-betamethasone (LOTRISONE) cream Apply 1 application topically 2 (two) times daily. For rash    Docusate Sodium (STOOL SOFTENER) 100 MG capsule Take 100 mg by mouth daily as needed. constipation    furosemide (LASIX) 40 MG tablet Take 40 mg by mouth daily as needed. Leg swelling  Take 2 tablets by mouth once daily    gabapentin (NEURONTIN) 300 MG capsule Take 300 mg by mouth 2 (two) times daily.    HYDROcodone-acetaminophen (NORCO) 10-325 MG per tablet Take 1 tablet by mouth every 4 (four) hours as needed. For pain    insulin NPH (HUMULIN N,NOVOLIN N) 100 UNIT/ML injection Inject 20 Units into the skin daily before breakfast.     insulin regular (NOVOLIN R,HUMULIN R) 100 units/mL injection Inject 15-25 Units into the skin once as needed. Blood sugar close to 200 uses 15 units, 225 or over 25 units.    losartan  (COZAAR) 50 MG tablet Take 1 tablet (50 mg total) by mouth daily. Qty: 30 tablet, Refills: 11   Associated Diagnoses: Essential hypertension, benign    omeprazole (PRILOSEC) 20 MG capsule Take 20 mg by mouth daily.    Insulin Syringe-Needle U-100 (INSULIN SYRINGE .5CC/31GX5/16") 31G X 5/16" 0.5 ML MISC As directed        Consults: Neurology:  Dr. Lina Sayre  Brief H and P: From the admission note: SAATHVIK EVERY is an 66 y.o. male with past medical history significant for multiple medical problems including diabetes, hypertension, coronary artery disease, congestive heart failure. Per discussion with patient his wife and the neighbor. Patient was in the fair health when she left this morning about 7:30 AM. He had breakfast with her. About 10:00 the neighbor who was supposed to do some work for the patient called on the phone without any response, he called again without any response he called patients wife at work then called 911. Patient was found sitting in his recliner unresponsive but breathing. On arrival of EMS patient with normal blood sugar normal blood pressure low temperature but unresponse with a blank stare. In the emergency department he was given Narcan IV fluids with no initial response to Narcan. Then about 2:30 patient became alert aware, altered mental status totally resolved and patient back to baseline. The last thing he remembers was this morning at breakfast. He is unsure of what happened after that. He did not lose control of his bladder or bowels did not bite  his tongue. He does not have any chest pain no shortness of breath no nausea or vomiting. Normally he is able to walk around with a walker or a cane. He had a stroke when he was 69 months old and has some right-sided deficits secondary to that. He was evaluated by the stroke team who did not think the patient had a stroke. Head CT was negative. We were asked to admit patient for further evaluation.   MRI of the brain result  described suspicion of acute punctate infarct on the right dorsal pons.  Consequently neurology was reconsulted for their opinion of the MRI results.  For Dr. Marlis Edelson note:  He had MRI scan of the brain done yesterday which shows a remote age infarct with encephalomalacia as well as tiny questionable area of the fusion positivity in the ventral right pons but there is no corresponding dark signal on the ADC map.  Assessment:  66 yo gentleman with episode of altered consciousness of unclear etiology. Possibilities include unwitnessed seizure with postictal confusion versus syncope. I doubt these symptoms can be explained by the tiny illefined diffusion-positive lesion seen on his MRI. This lesion is likely a silent age indeterminate small vessel disease infarct. He certainly has multiple risk factors for small vessel disease and strokes.   Plan : Recommend increase aspirin to 325 mg a day. Check carotid ultrasound, Transcranial Dopplers, transthoracic echocardiogram and therapy consults.  Carotid Ultrasound and transthoracic echocardiogram were checked (results are below).  The patients symptoms had completely resolved and it was felt by the attending physician and neurology that it was safe and appropriate for Mr. Charter to complete his evaluation as an outpatient.  Follow up is scheduled with his PCP and he is to call Dr. Marlis Edelson office for an appointment.    The rest of the patient's comorbidities were reasonably well controlled during this hospitalization on his current medications.  Hospital Course:  Active Problems:  DIABETES MELLITUS, TYPE I  ANEMIA-IRON DEFICIENCY  PERIPHERAL NEUROPATHY  HYPERTENSION  CONGESTIVE HEART FAILURE  RENAL INSUFFICIENCY  ULCER, LEG  CERVICAL RADICULOPATHY, LEFT  CEREBROVASCULAR ACCIDENT, HX OF  HYPERKALEMIA  Altered mental status  Hypothermia   Physical Exam on Discharge: General: Alert, awake, oriented x3, in no acute distress.  No teeth tongue appears  slightly enlarged. HEENT: No bruits, no goiter. Heart: Regular rate and rhythm, without murmurs, rubs, gallops. Lungs: Clear to auscultation bilaterally. Abdomen: Soft, nontender, nondistended, positive bowel sounds. Extremities: Chronic LE wounds, Left leg stasis ulcerations.  Right leg without open wounds.  without drainage. Neuro: Patient has left sided hemi atrophy - over the entire left side of his body. Residual from stroke at two months of age.  Filed Vitals:   08/23/11 1300 08/23/11 1642 08/23/11 2109 08/24/11 0513  BP: 160/70 174/86 164/75 139/61  Pulse: 60 54 60 56  Temp: 98.2 F (36.8 C) 97.2 F (36.2 C) 97.6 F (36.4 C) 97.4 F (36.3 C)  TempSrc: Oral Oral Oral Oral  Resp: 18 18 20 18   Height:      Weight:      SpO2: 98% 98% 99% 100%     Intake/Output Summary (Last 24 hours) at 08/24/11 0929 Last data filed at 08/24/11 0000  Gross per 24 hour  Intake   1040 ml  Output   1150 ml  Net   -110 ml    Basic Metabolic Panel:  Lab 08/24/11 1478 08/23/11 0525  NA 136 136  K 4.3 4.3  CL 100 101  CO2 27 27  GLUCOSE 86 133*  BUN 21 21  CREATININE 1.23 1.08  CALCIUM 9.4 9.2  MG -- --  PHOS -- --   Liver Function Tests:  Lab 08/22/11 2033 08/22/11 1113  AST 63* 38*  ALT 47 53  ALKPHOS 73 90  BILITOT 0.4 0.3  PROT 7.9 7.9  ALBUMIN 3.7 3.7   CBC:  Lab 08/23/11 0525 08/22/11 2033 08/22/11 1113  WBC 9.6 11.0* --  NEUTROABS -- -- 7.6  HGB 12.9* 14.0 --  HCT 40.0 41.6 --  MCV 85.8 85.6 --  PLT 225 248 --   Cardiac Enzymes:  Lab 08/22/11 1113  CKTOTAL 403*  CKMB 5.9*  CKMBINDEX --  TROPONINI <0.30   CBG:  Lab 08/24/11 0755 08/23/11 2159 08/23/11 1641 08/23/11 1205 08/23/11 0740 08/22/11 2113  GLUCAP 94 109* 88 207* 110* 140*   Hemoglobin A1C:  Lab 08/22/11 2033  HGBA1C 6.9*   Thyroid Function Tests:  Lab 08/23/11 1248  TSH 0.416  T4TOTAL --  FREET4 1.33  T3FREE --  THYROIDAB --   Coagulation:  Lab 08/22/11 1113  LABPROT 14.1    INR 1.07   Urine Drug Screen: Drugs of Abuse     Component Value Date/Time   LABOPIA POSITIVE* 08/22/2011 1539   COCAINSCRNUR NONE DETECTED 08/22/2011 1539   LABBENZ NONE DETECTED 08/22/2011 1539   AMPHETMU NONE DETECTED 08/22/2011 1539   THCU NONE DETECTED 08/22/2011 1539   LABBARB NONE DETECTED 08/22/2011 1539    Alcohol Level:  Lab 08/22/11 1202  ETH <11    Significant Diagnostic Studies:   Ct Head Wo Contrast  08/22/2011  *RADIOLOGY REPORT*  Clinical Data: Sudden onset of altered mental status.  Confusion.  CT HEAD WITHOUT CONTRAST  Technique:  Contiguous axial images were obtained from the base of the skull through the vertex without contrast.  Comparison: None.  Findings: No acute intracranial hemorrhage, infarction, or mass lesion.  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  No acute osseous abnormality.  IMPRESSION: No acute intracranial abnormality.  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     Mr Brain Wo Contrast  08/23/2011  *RADIOLOGY REPORT*  Clinical Data: Altered mental status.  Diabetes and hypertension.  MRI HEAD WITHOUT CONTRAST  .  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  Old left parietal cortical and subcortical infarction.  Suspicion of a punctate acute infarction in the right dorsal pons. This is not absolutely certain.   Dg Chest Port 1 View  08/22/2011  *RADIOLOGY REPORT*  Clinical Data: Altered level of consciousness.  PORTABLE CHEST - 1 VIEW  Comparison: Chest 11/30/2010.  IMPRESSION: Cardiomegaly and mild interstitial edema.    2D Echo Cardiogram:  Study Conclusions - Left ventricle: Inferobasal wall hypokinesis The cavity size was normal. Wall thickness was normal. Systolic function was normal. The estimated ejection fraction was in the range of 50% to 55%. - Left atrium: The atrium was mildly dilated. - Atrial septum: No defect or patent foramen  ovale was identified.  Carotid Dopplers . (Preliminary Results Report) Right: no significant ICA stenosis. Left: 40-60% ICA stenosis, highest end of range. Bilateral vertebral artery flow is antegrade.   Disposition and Follow-up: Stable for discharge to home.  Discharge Orders    Future Appointments: Provider: Department: Dept Phone: Center:   09/01/2011 10:45 AM [name], MD [facility] [phone] [facility]     Future Orders Please Complete By Expires   Diet - low  sodium heart healthy      Increase activity slowly        Follow-up Information    Follow up with [name], MD on 09/01/2011. (10:45am)       Follow up with Gates Rigg, MD. Schedule an appointment as soon as possible for a visit in 1 week. (To be seen within two months.)    Contact information:   [address] [facility] [place] [address] [phone]         Time spent on Discharge: 40 min  Signed: Stephani Police 08/24/2011, 9:29 AM [phone]   I have personally examined the patient and reviewed the entire database. Agree with the above note and plan as outlined by Ms. York.  Chau Sawin M.D. [facility] 08/24/2011, 10:46 AM

## 2011-08-24 NOTE — Progress Notes (Signed)
1300  Pt discharge information discussed with pt and wife, no Rx.  Copy given, Aware of follow up appts.  Pink redness to bil abd folds with healing yeast infection, pt states looks much better today, using ointment after cleansing during bath this am.  Pt right arm and leg smaller on right side than normal of left, has always been that way.  No devicits except weakness of right side, uses 3 prong cane but is unstable with gait, refuses to call for assistance, find him up walking and staggering at times.  Reinforced time and time again to allow [name] to assist.  Pt is also legally blind and wears dark glasses.  IV removed from left forearm with out difficulty.  Bil lower legs swollen and v/stasis areas that have been weeping but now stopped.  Pt escorted via w/c to meet wife for discharge home.  Bonney Leitz RN

## 2011-08-28 ENCOUNTER — Encounter (HOSPITAL_BASED_OUTPATIENT_CLINIC_OR_DEPARTMENT_OTHER): Payer: Medicare Other

## 2011-08-29 ENCOUNTER — Emergency Department (HOSPITAL_COMMUNITY)
Admission: EM | Admit: 2011-08-29 | Discharge: 2011-08-29 | Disposition: A | Discharge status: Home / Self Care | Payer: Medicare Other | Attending: Emergency Medicine | Admitting: Emergency Medicine

## 2011-08-29 ENCOUNTER — Emergency Department (HOSPITAL_COMMUNITY): Payer: Medicare Other

## 2011-08-29 ENCOUNTER — Other Ambulatory Visit: Payer: Self-pay

## 2011-08-29 ENCOUNTER — Encounter (HOSPITAL_COMMUNITY): Payer: Self-pay | Admitting: *Deleted

## 2011-08-29 DIAGNOSIS — I2510 Atherosclerotic heart disease of native coronary artery without angina pectoris: Secondary | ICD-10-CM | POA: Insufficient documentation

## 2011-08-29 DIAGNOSIS — E11319 Type 2 diabetes mellitus with unspecified diabetic retinopathy without macular edema: Secondary | ICD-10-CM | POA: Insufficient documentation

## 2011-08-29 DIAGNOSIS — Z7982 Long term (current) use of aspirin: Secondary | ICD-10-CM | POA: Insufficient documentation

## 2011-08-29 DIAGNOSIS — I10 Essential (primary) hypertension: Secondary | ICD-10-CM | POA: Insufficient documentation

## 2011-08-29 DIAGNOSIS — I509 Heart failure, unspecified: Secondary | ICD-10-CM | POA: Insufficient documentation

## 2011-08-29 DIAGNOSIS — E1039 Type 1 diabetes mellitus with other diabetic ophthalmic complication: Secondary | ICD-10-CM | POA: Insufficient documentation

## 2011-08-29 DIAGNOSIS — Z860100 Personal history of colon polyps, unspecified: Secondary | ICD-10-CM | POA: Insufficient documentation

## 2011-08-29 DIAGNOSIS — Z8673 Personal history of transient ischemic attack (TIA), and cerebral infarction without residual deficits: Secondary | ICD-10-CM | POA: Insufficient documentation

## 2011-08-29 DIAGNOSIS — R55 Syncope and collapse: Secondary | ICD-10-CM | POA: Insufficient documentation

## 2011-08-29 DIAGNOSIS — M542 Cervicalgia: Secondary | ICD-10-CM | POA: Insufficient documentation

## 2011-08-29 DIAGNOSIS — I739 Peripheral vascular disease, unspecified: Secondary | ICD-10-CM | POA: Insufficient documentation

## 2011-08-29 DIAGNOSIS — H543 Unqualified visual loss, both eyes: Secondary | ICD-10-CM | POA: Insufficient documentation

## 2011-08-29 DIAGNOSIS — W19XXXA Unspecified fall, initial encounter: Secondary | ICD-10-CM

## 2011-08-29 DIAGNOSIS — Z79899 Other long term (current) drug therapy: Secondary | ICD-10-CM | POA: Insufficient documentation

## 2011-08-29 DIAGNOSIS — M1990 Unspecified osteoarthritis, unspecified site: Secondary | ICD-10-CM | POA: Insufficient documentation

## 2011-08-29 DIAGNOSIS — Z794 Long term (current) use of insulin: Secondary | ICD-10-CM | POA: Insufficient documentation

## 2011-08-29 DIAGNOSIS — G609 Hereditary and idiopathic neuropathy, unspecified: Secondary | ICD-10-CM | POA: Insufficient documentation

## 2011-08-29 DIAGNOSIS — Z951 Presence of aortocoronary bypass graft: Secondary | ICD-10-CM | POA: Insufficient documentation

## 2011-08-29 DIAGNOSIS — E1049 Type 1 diabetes mellitus with other diabetic neurological complication: Secondary | ICD-10-CM | POA: Insufficient documentation

## 2011-08-29 DIAGNOSIS — R5381 Other malaise: Secondary | ICD-10-CM | POA: Insufficient documentation

## 2011-08-29 DIAGNOSIS — I252 Old myocardial infarction: Secondary | ICD-10-CM | POA: Insufficient documentation

## 2011-08-29 HISTORY — DX: Cerebral infarction, unspecified: I63.9

## 2011-08-29 LAB — URINE CULTURE
Colony Count: NO GROWTH
Culture  Setup Time: 201301221150
Culture: NO GROWTH

## 2011-08-29 LAB — BASIC METABOLIC PANEL
BUN: 22 mg/dL (ref 6–23)
CO2: 28 mEq/L (ref 19–32)
Calcium: 8.7 mg/dL (ref 8.4–10.5)
Chloride: 100 mEq/L (ref 96–112)
Creatinine, Ser: 1.11 mg/dL (ref 0.50–1.35)
GFR calc Af Amer: 77 mL/min — ABNORMAL LOW (ref >90)
GFR calc non Af Amer: 67 mL/min — ABNORMAL LOW (ref >90)
Glucose, Bld: 140 mg/dL — ABNORMAL HIGH (ref 70–99)
Potassium: 4.1 mEq/L (ref 3.5–5.1)
Sodium: 136 mEq/L (ref 135–145)

## 2011-08-29 LAB — CBC
HCT: 41.1 % (ref 39.0–52.0)
Hemoglobin: 13 g/dL (ref 13.0–17.0)
MCH: 27.7 pg (ref 26.0–34.0)
MCHC: 31.6 g/dL (ref 30.0–36.0)
MCV: 87.4 fL (ref 78.0–100.0)
Platelets: 199 10*3/uL (ref 150–400)
RBC: 4.7 MIL/uL (ref 4.22–5.81)
RDW: 14.1 % (ref 11.5–15.5)
WBC: 6.8 10*3/uL (ref 4.0–10.5)

## 2011-08-29 LAB — URINALYSIS, ROUTINE W REFLEX MICROSCOPIC
Bilirubin Urine: NEGATIVE
Glucose, UA: NEGATIVE mg/dL
Hgb urine dipstick: NEGATIVE
Ketones, ur: NEGATIVE mg/dL
Leukocytes, UA: NEGATIVE
Nitrite: NEGATIVE
Protein, ur: NEGATIVE mg/dL
Specific Gravity, Urine: 1.01 (ref 1.005–1.030)
Urobilinogen, UA: 0.2 mg/dL (ref 0.0–1.0)
pH: 7 (ref 5.0–8.0)

## 2011-08-29 LAB — DIFFERENTIAL
Basophils Absolute: 0 10*3/uL (ref 0.0–0.1)
Basophils Relative: 0 % (ref 0–1)
Eosinophils Absolute: 0.2 10*3/uL (ref 0.0–0.7)
Eosinophils Relative: 3 % (ref 0–5)
Lymphocytes Relative: 16 % (ref 12–46)
Lymphs Abs: 1.1 10*3/uL (ref 0.7–4.0)
Monocytes Absolute: 1 10*3/uL (ref 0.1–1.0)
Monocytes Relative: 14 % — ABNORMAL HIGH (ref 3–12)
Neutro Abs: 4.5 10*3/uL (ref 1.7–7.7)
Neutrophils Relative %: 67 % (ref 43–77)

## 2011-08-29 LAB — TROPONIN I: Troponin I: <0.3 ng/mL (ref <0.30)

## 2011-08-29 MED ORDER — SODIUM CHLORIDE 0.9 % IV BOLUS (SEPSIS)
1000.0000 mL | Freq: Once | INTRAVENOUS | Status: AC
  Administered 2011-08-29: 1000 mL via INTRAVENOUS

## 2011-08-29 NOTE — ED Provider Notes (Signed)
History     CSN: [id number]  Arrival date & time 08/29/11  0906   First MD Initiated Contact with Patient 08/29/11 [phone]      Chief Complaint  Patient presents with  . Weakness  . Near Syncope  . Fall    (Consider location/radiation/quality/duration/timing/severity/associated sxs/prior treatment)  Patient is a 66 y.o. male presenting with weakness and fall.  Weakness  Additional symptoms include weakness.  Fall   Patient presents after falling at home this morning. No one witnessed the fall. Patient states that he was walking from the hallway into his bedroom and began to fell weak and fell slowly onto the ground. He denies LOC. He states that he fell on his R side and landed on carpet. He complains of a neck ache and has had surgery on his neck about 3 years ago. He was able to use his cell phone and call a neighbor who came over and called 911. He reports some mild weakness and nausea now. He denies dizziness, chest pain, shortness of breath, and any other pain. He was here exactly 1 week ago for altered mental status of unclear etiology.   Past Medical History  Diagnosis Date  . CORONARY ARTERY DISEASE 03/11/2007  . DIABETES MELLITUS, TYPE I 03/11/2007  . HYPERTENSION 03/11/2007  . BLINDNESS 05/21/2008  . Proliferative diabetic retinopathy 03/09/2009  . HYPERCHOLESTEROLEMIA 09/28/2008  . CERVICAL RADICULOPATHY, LEFT 09/23/2008  . ANEMIA-IRON DEFICIENCY 09/23/2008  . DEPRESSION 09/23/2008  . PERIPHERAL NEUROPATHY 09/23/2008  . MYOCARDIAL INFARCTION, HX OF 09/23/2008  . CONGESTIVE HEART FAILURE 09/23/2008  . RENAL INSUFFICIENCY 03/15/2010  . CEREBROVASCULAR ACCIDENT, HX OF 09/23/2008  . 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 Venous insufficiency of leg   . ED (erectile dysfunction)   . Charcot foot due to diabetes mellitus     chronic pain  . Hypogonadism male   . Congestive heart failure   . Duodenal ulcer 2012    multiple  . GI bleed   . Osteoarthritis     . Stroke     Past Surgical History  Procedure Date  . Coronary artery bypass graft   . Carpal tunnel release     left  . Wrist surgery     s/p right-bone graft fusion  . Spine surgery 2010    C spine surgery for disc Dr. Yetta Barre  . Sigmoidoscopy 03/11/2001  . Venous doppler 01/30/2004  . Cataract surgery   . Ankle surgery     left  . Foot surgery     left    Family History  Problem Relation Age of Onset  . Heart disease Mother     Coronary Artery Disease  . Heart disease Father     Coronary Artery Disease  . Heart disease Paternal Grandmother     Coronary Artery Disease  . Colon cancer Neg Hx   . Diabetes Other     Grandmother    History  Substance Use Topics  . Smoking status: Never Smoker   . Smokeless tobacco: Never Used  . Alcohol Use: No      Review of Systems  Neurological: Positive for weakness.   All pertinent positives and negatives reviewed in the history of present illness  Allergies  Contrast media and Sildenafil  Home Medications   Current Outpatient Rx  Name Route Sig Dispense Refill  . ASPIRIN 325 MG PO TABS Oral Take 1 tablet (325 mg total) by mouth daily.    [name] CARVEDILOL 3.125 MG PO  TABS Oral Take 3.125 mg by mouth 2 (two) times daily with a meal.      . CLOTRIMAZOLE-BETAMETHASONE 1-0.05 % EX CREA Topical Apply 1 application topically 2 (two) times daily. For rash    . DOCUSATE SODIUM 100 MG PO TABS Oral Take 100 mg by mouth daily as needed. constipation    . GABAPENTIN 300 MG PO CAPS Oral Take 300 mg by mouth 2 (two) times daily.    [name] HYDROCODONE-ACETAMINOPHEN 10-325 MG PO TABS Oral Take 1 tablet by mouth every 4 (four) hours as needed. For pain    . INSULIN ISOPHANE HUMAN 100 UNIT/ML [place] SUSP Subcutaneous Inject 20 Units into the skin daily before breakfast.     . LOSARTAN POTASSIUM 50 MG PO TABS Oral Take 1 tablet (50 mg total) by mouth daily. 30 tablet 11  . OMEPRAZOLE 20 MG PO CPDR Oral Take 20 mg by mouth daily.    . INSULIN  REGULAR HUMAN 100 UNIT/ML IJ SOLN Subcutaneous Inject 15-25 Units into the skin daily as needed. Blood sugar close to 200 uses 15 units, 225 or over 25 units.    . INSULIN SYRINGE 31G X 5/16" 0.5 ML MISC  As directed      BP 160/39  Pulse 65  Temp(Src) 98.7 F (37.1 C) (Oral)  Resp 18  SpO2 98%  Physical Exam  Constitutional: He is oriented to person, place, and time. He appears well-developed and well-nourished. No distress.  HENT:  Head: Normocephalic and atraumatic.  Mouth/Throat: Oropharynx is clear and moist.  Eyes: Conjunctivae and EOM are normal. Pupils are equal, round, and reactive to light.  Cardiovascular: Normal rate, regular rhythm and normal heart sounds.   Pulmonary/Chest: Effort normal and breath sounds normal.  Abdominal: Soft. Bowel sounds are normal. There is no tenderness.  Neurological: He is alert and oriented to person, place, and time.       R sided weakness that is present at baseline, reported as from a stroke when he was an infant  Skin: Skin is warm and dry. He is not diaphoretic.  Psychiatric: His behavior is normal.     ED Course  Procedures (including critical care time)    Patient has ambulated about the emergency department without assistance and no difficulty.  He does not have any further dizziness or weakness.  He did not use his cane or walker like he is supposed to today and that most likely contributed to his mechanical fall.  Patient has a close followup with his doctor this week.  He is told to return here for any worsening in his condition.  His vital signs remained stable while here in the department.     MDM   Date: 08/29/2011  Rate: 59  Rhythm: normal sinus rhythm  QRS Axis: normal  Intervals: normal  ST/T Wave abnormalities: normal  Conduction Disutrbances:right bundle branch block and left posterior fascicular block  Narrative Interpretation:   Old EKG Reviewed: unchanged  MDM Reviewed: vitals, nursing note and previous  chart Reviewed previous: ECG and labs Interpretation: labs, ECG and CT scan       I spoke with Dr. Yetta Barre, the patient's neurosurgeon. He reviewed his history, notes, and x-rays. He would like for the patient to follow up as an outpatient regarding his loose screw.       Carlyle Dolly, PA-C 08/29/11 1350  Carlyle Dolly, PA-C 08/30/11 [phone]

## 2011-08-29 NOTE — ED Notes (Signed)
Gave old and new ECG to Dr. Ranae Palms 10:15 am JG

## 2011-08-29 NOTE — ED Notes (Signed)
Gave pt a urinal and a urine cup with a label so we could get a sample. 10:38 am JG

## 2011-08-29 NOTE — ED Notes (Signed)
Pt was just here last week when neighbors found unresponsive and came here and was diagnosed with TIA.  Pt had a stroke at 2 months old and has had right sidedweakness since.  Today pt became weak and collapsed  And question slurred speech.  [id number].  Pt is alert and oriented.

## 2011-08-29 NOTE — ED Notes (Signed)
Wife reports that neighbor told her that pt had slurred speech this am.    Wife states that pt was fine at 0735 this am.    Per wife pt is his norm now.

## 2011-08-29 NOTE — ED Notes (Signed)
Ambulated pt per Lowella Bandy, RN; pt ambulated with minimal assistance from stretcher to nurses' station and back with no difficulty and/or distress; pt states he usually walks with a cane; Lowella Bandy, RN aware

## 2011-08-31 ENCOUNTER — Emergency Department (HOSPITAL_COMMUNITY)
Admission: EM | Admit: 2011-08-31 | Discharge: 2011-08-31 | Disposition: A | Discharge status: Home / Self Care | Payer: Medicare Other | Attending: Emergency Medicine | Admitting: Emergency Medicine

## 2011-08-31 ENCOUNTER — Other Ambulatory Visit: Payer: Self-pay

## 2011-08-31 ENCOUNTER — Encounter (HOSPITAL_COMMUNITY): Payer: Self-pay | Admitting: Emergency Medicine

## 2011-08-31 ENCOUNTER — Emergency Department (HOSPITAL_COMMUNITY): Payer: Medicare Other

## 2011-08-31 DIAGNOSIS — M533 Sacrococcygeal disorders, not elsewhere classified: Secondary | ICD-10-CM | POA: Insufficient documentation

## 2011-08-31 DIAGNOSIS — Z8673 Personal history of transient ischemic attack (TIA), and cerebral infarction without residual deficits: Secondary | ICD-10-CM | POA: Insufficient documentation

## 2011-08-31 DIAGNOSIS — F329 Major depressive disorder, single episode, unspecified: Secondary | ICD-10-CM | POA: Insufficient documentation

## 2011-08-31 DIAGNOSIS — I509 Heart failure, unspecified: Secondary | ICD-10-CM | POA: Insufficient documentation

## 2011-08-31 DIAGNOSIS — I10 Essential (primary) hypertension: Secondary | ICD-10-CM | POA: Insufficient documentation

## 2011-08-31 DIAGNOSIS — I739 Peripheral vascular disease, unspecified: Secondary | ICD-10-CM | POA: Insufficient documentation

## 2011-08-31 DIAGNOSIS — E7800 Pure hypercholesterolemia, unspecified: Secondary | ICD-10-CM | POA: Insufficient documentation

## 2011-08-31 DIAGNOSIS — M1990 Unspecified osteoarthritis, unspecified site: Secondary | ICD-10-CM | POA: Insufficient documentation

## 2011-08-31 DIAGNOSIS — M542 Cervicalgia: Secondary | ICD-10-CM | POA: Insufficient documentation

## 2011-08-31 DIAGNOSIS — E109 Type 1 diabetes mellitus without complications: Secondary | ICD-10-CM | POA: Insufficient documentation

## 2011-08-31 DIAGNOSIS — R4789 Other speech disturbances: Secondary | ICD-10-CM | POA: Insufficient documentation

## 2011-08-31 DIAGNOSIS — R42 Dizziness and giddiness: Secondary | ICD-10-CM | POA: Insufficient documentation

## 2011-08-31 DIAGNOSIS — Z7982 Long term (current) use of aspirin: Secondary | ICD-10-CM | POA: Insufficient documentation

## 2011-08-31 DIAGNOSIS — H543 Unqualified visual loss, both eyes: Secondary | ICD-10-CM | POA: Insufficient documentation

## 2011-08-31 DIAGNOSIS — I2510 Atherosclerotic heart disease of native coronary artery without angina pectoris: Secondary | ICD-10-CM | POA: Insufficient documentation

## 2011-08-31 DIAGNOSIS — Z794 Long term (current) use of insulin: Secondary | ICD-10-CM | POA: Insufficient documentation

## 2011-08-31 DIAGNOSIS — Z79899 Other long term (current) drug therapy: Secondary | ICD-10-CM | POA: Insufficient documentation

## 2011-08-31 DIAGNOSIS — R531 Weakness: Secondary | ICD-10-CM

## 2011-08-31 DIAGNOSIS — F3289 Other specified depressive episodes: Secondary | ICD-10-CM | POA: Insufficient documentation

## 2011-08-31 DIAGNOSIS — I252 Old myocardial infarction: Secondary | ICD-10-CM | POA: Insufficient documentation

## 2011-08-31 DIAGNOSIS — R5381 Other malaise: Secondary | ICD-10-CM | POA: Insufficient documentation

## 2011-08-31 DIAGNOSIS — R5383 Other fatigue: Secondary | ICD-10-CM | POA: Insufficient documentation

## 2011-08-31 LAB — URINALYSIS, ROUTINE W REFLEX MICROSCOPIC
Bilirubin Urine: NEGATIVE
Nitrite: NEGATIVE
Specific Gravity, Urine: 1.006 (ref 1.005–1.030)
pH: 6.5 (ref 5.0–8.0)

## 2011-08-31 LAB — COMPREHENSIVE METABOLIC PANEL
ALT: 22 U/L (ref 0–53)
AST: 21 U/L (ref 0–37)
Albumin: 3.4 g/dL — ABNORMAL LOW (ref 3.5–5.2)
Alkaline Phosphatase: 79 U/L (ref 39–117)
Potassium: 4.4 mEq/L (ref 3.5–5.1)
Sodium: 136 mEq/L (ref 135–145)
Total Protein: 7.2 g/dL (ref 6.0–8.3)

## 2011-08-31 LAB — CBC
MCH: 28.4 pg (ref 26.0–34.0)
MCHC: 32.7 g/dL (ref 30.0–36.0)
Platelets: 181 10*3/uL (ref 150–400)

## 2011-08-31 LAB — LIPASE, BLOOD: Lipase: 71 U/L — ABNORMAL HIGH (ref 11–59)

## 2011-08-31 LAB — DIFFERENTIAL
Basophils Absolute: 0 10*3/uL (ref 0.0–0.1)
Basophils Relative: 0 % (ref 0–1)
Eosinophils Absolute: 0.3 10*3/uL (ref 0.0–0.7)
Neutrophils Relative %: 66 % (ref 43–77)

## 2011-08-31 MED ORDER — SODIUM CHLORIDE 0.9 % IV BOLUS (SEPSIS)
500.0000 mL | Freq: Once | INTRAVENOUS | Status: AC
  Administered 2011-08-31: 500 mL via INTRAVENOUS

## 2011-08-31 NOTE — ED Provider Notes (Signed)
History     CSN: [id number]  Arrival date & time 08/31/11  0857   First MD Initiated Contact with Patient 08/31/11 0906      9:59 AM HPI Patient was seen here 2 days ago due to a fall. States since then has continued to feel weak and describes weakness as a generalized weakness with significant pain of neck in his tailbone. Denies upper respiratory tract infection, chest pain, shortness of breath, abdominal pain, and nausea, vomiting, diarrhea, fever. Reports a significant history of cervical surgery. Was told during last visit that he had a loose screw in his neck. Advised followup with neurosurgeon however has not made an appointment yet. Patient is a 66 y.o. male presenting with weakness. The history is provided by the patient.  Weakness The primary symptoms include dizziness. Primary symptoms do not include headaches, seizures, fever, nausea or vomiting.  Dizziness also occurs with weakness. Dizziness does not occur with nausea or vomiting.  Additional symptoms include weakness.    Past Medical History  Diagnosis Date  . CORONARY ARTERY DISEASE 03/11/2007  . DIABETES MELLITUS, TYPE I 03/11/2007  . HYPERTENSION 03/11/2007  . BLINDNESS 05/21/2008  . Proliferative diabetic retinopathy 03/09/2009  . HYPERCHOLESTEROLEMIA 09/28/2008  . CERVICAL RADICULOPATHY, LEFT 09/23/2008  . ANEMIA-IRON DEFICIENCY 09/23/2008  . DEPRESSION 09/23/2008  . PERIPHERAL NEUROPATHY 09/23/2008  . MYOCARDIAL INFARCTION, HX OF 09/23/2008  . CONGESTIVE HEART FAILURE 09/23/2008  . RENAL INSUFFICIENCY 03/15/2010  . CEREBROVASCULAR ACCIDENT, HX OF 09/23/2008  . 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 Venous insufficiency of leg   . ED (erectile dysfunction)   . Charcot foot due to diabetes mellitus     chronic pain  . Hypogonadism male   . Congestive heart failure   . Duodenal ulcer 2012    multiple  . GI bleed   . Osteoarthritis   . Stroke     Past Surgical History  Procedure Date  .  Coronary artery bypass graft   . Carpal tunnel release     left  . Wrist surgery     s/p right-bone graft fusion  . Spine surgery 2010    C spine surgery for disc Dr. Yetta Barre  . Sigmoidoscopy 03/11/2001  . Venous doppler 01/30/2004  . Cataract surgery   . Ankle surgery     left  . Foot surgery     left    Family History  Problem Relation Age of Onset  . Heart disease Mother     Coronary Artery Disease  . Heart disease Father     Coronary Artery Disease  . Heart disease Paternal Grandmother     Coronary Artery Disease  . Colon cancer Neg Hx   . Diabetes Other     Grandmother    History  Substance Use Topics  . Smoking status: Never Smoker   . Smokeless tobacco: Never Used  . Alcohol Use: No      Review of Systems  Constitutional: Negative for fever and chills.  HENT: Negative for congestion, rhinorrhea and neck pain.   Respiratory: Negative for shortness of breath.   Cardiovascular: Negative for chest pain.  Gastrointestinal: Negative for nausea, vomiting and abdominal pain.  Genitourinary: Negative for dysuria, urgency, frequency and hematuria.  Musculoskeletal: Negative for back pain.  Neurological: Positive for dizziness and weakness. Negative for seizures, speech difficulty, numbness and headaches. Syncope: Near syncope.    Allergies  Contrast media and Sildenafil  Home Medications   Current Outpatient Rx  Name Route  Sig Dispense Refill  . ASPIRIN 325 MG PO TABS Oral Take 325 mg by mouth daily.    [name] CARVEDILOL 3.125 MG PO TABS Oral Take 3.125 mg by mouth 2 (two) times daily with a meal.      . CLOTRIMAZOLE-BETAMETHASONE 1-0.05 % EX CREA Topical Apply 1 application topically 2 (two) times daily as needed. For rash    . DOCUSATE SODIUM 100 MG PO TABS Oral Take 100 mg by mouth daily as needed. constipation    . GABAPENTIN 300 MG PO CAPS Oral Take 300 mg by mouth 2 (two) times daily.    [name] HYDROCODONE-ACETAMINOPHEN 10-325 MG PO TABS Oral Take 1 tablet by mouth  every 4 (four) hours as needed. For pain    . INSULIN ISOPHANE HUMAN 100 UNIT/ML [place] SUSP Subcutaneous Inject 20 Units into the skin daily before breakfast.     . INSULIN REGULAR HUMAN 100 UNIT/ML IJ SOLN Subcutaneous Inject 15-25 Units into the skin daily as needed. Blood sugar close to 200 uses 15 units, 225 or over 25 units.    [name] LOSARTAN POTASSIUM 50 MG PO TABS Oral Take 50 mg by mouth daily.    [name] OMEPRAZOLE 20 MG PO CPDR Oral Take 20 mg by mouth daily.    . INSULIN SYRINGE 31G X 5/16" 0.5 ML MISC  As directed      BP 159/50  Pulse 60  Temp(Src) 97.4 F (36.3 C) (Oral)  Resp 13  Ht 5\' 8"  (1.727 m)  Wt 230 lb (104.327 kg)  BMI 34.97 kg/m2  SpO2 100%  Physical Exam  Constitutional: He is oriented to person, place, and time. He appears well-developed and well-nourished.  HENT:  Head: Normocephalic and atraumatic.  Mouth/Throat: Oropharynx is clear and moist. No oropharyngeal exudate.  Eyes: Conjunctivae and EOM are normal. Pupils are equal, round, and reactive to light.  Neck: Normal range of motion. Neck supple.  Cardiovascular: Normal rate, regular rhythm and normal heart sounds.  Exam reveals no gallop and no friction rub.   No murmur heard. Pulmonary/Chest: Effort normal and breath sounds normal. No respiratory distress. He has no wheezes. He has no rales. He exhibits no tenderness.  Abdominal: Soft. Bowel sounds are normal.  Neurological: He is alert and oriented to person, place, and time. No cranial nerve deficit or sensory deficit. He exhibits abnormal muscle tone (Right side slightly weaker. Pt states this is normal and unchanged for years). Coordination normal.  Skin: Skin is warm and dry. No rash noted. No erythema. No pallor.  Psychiatric: He has a normal mood and affect. His behavior is normal.    ED Course  Procedures   Ct Head Wo Contrast  08/29/2011  *RADIOLOGY REPORT*  Clinical Data:  Weakness.  Slurred speech.  CT HEAD WITHOUT CONTRAST CT CERVICAL SPINE WITHOUT  CONTRAST  Technique:  Multidetector CT imaging of the head and cervical spine was performed following the standard protocol without intravenous contrast.  Multiplanar CT image reconstructions of the cervical spine were also generated.  Comparison:  Multiple exams, including 08/22/2011  CT HEAD  Findings: Chronic microvascular white matter disease involves the central pons the in the periventricular white matter.  Old left posterior frontal lobe infarct noted.  The cerebellum, thalami, and basal ganglia appear unremarkable. Motion artifact at the cranial vertex obscures the gray-white differentiation.  No intracranial hemorrhage, mass lesion, or acute infarction is identified.  IMPRESSION:  1. Periventricular and corona radiata white matter hypodensities are most compatible with chronic ischemic microvascular white  matter disease.  2.  Old left posterior frontal lobe infarct noted.  CT CERVICAL SPINE  Findings: Plate and screw fixation at C3 - C4-C5 noted anteriorly. There is evidence of loosening along the C5 screws, and the right C5 screw has backed out approximately 7 mm.  Posterior osseous ridging and loss of disc height noted at the C6-7 level.  No fracture or acute subluxation is observed.  No significant prevertebral soft tissue swelling noted.  A 2.2 x 1.9 cm right thyroid nodule is observed.  There is a 1.8 x 1.3 cm left thyroid nodule.  IMPRESSION:  1.  Cervical fusion and spondylosis, without fracture or acute subluxation. 2.  Loosening around the C5 fixation screws.  The right C5 screw has backed out approximately 7 mm from the plate. 3.  Bilateral large thyroid nodules.  If not previously worked up these could be further assessed by thyroid ultrasound on an elective basis.  Original Report Authenticated By: Dellia Cloud, M.D.   Ct Cervical Spine Wo Contrast  08/29/2011  *RADIOLOGY REPORT*  Clinical Data:  Weakness.  Slurred speech.  CT HEAD WITHOUT CONTRAST CT CERVICAL SPINE WITHOUT CONTRAST   Technique:  Multidetector CT imaging of the head and cervical spine was performed following the standard protocol without intravenous contrast.  Multiplanar CT image reconstructions of the cervical spine were also generated.  Comparison:  Multiple exams, including 08/22/2011  CT HEAD  Findings: Chronic microvascular white matter disease involves the central pons the in the periventricular white matter.  Old left posterior frontal lobe infarct noted.  The cerebellum, thalami, and basal ganglia appear unremarkable. Motion artifact at the cranial vertex obscures the gray-white differentiation.  No intracranial hemorrhage, mass lesion, or acute infarction is identified.  IMPRESSION:  1. Periventricular and corona radiata white matter hypodensities are most compatible with chronic ischemic microvascular white matter disease.  2.  Old left posterior frontal lobe infarct noted.  CT CERVICAL SPINE  Findings: Plate and screw fixation at C3 - C4-C5 noted anteriorly. There is evidence of loosening along the C5 screws, and the right C5 screw has backed out approximately 7 mm.  Posterior osseous ridging and loss of disc height noted at the C6-7 level.  No fracture or acute subluxation is observed.  No significant prevertebral soft tissue swelling noted.  A 2.2 x 1.9 cm right thyroid nodule is observed.  There is a 1.8 x 1.3 cm left thyroid nodule.  IMPRESSION:  1.  Cervical fusion and spondylosis, without fracture or acute subluxation. 2.  Loosening around the C5 fixation screws.  The right C5 screw has backed out approximately 7 mm from the plate. 3.  Bilateral large thyroid nodules.  If not previously worked up these could be further assessed by thyroid ultrasound on an elective basis.  Original Report Authenticated By: Dellia Cloud, M.D.   Results for orders placed during the hospital encounter of 08/31/11  CBC      Component Value Range   WBC 7.8  4.0 - 10.5 (K/uL)   RBC 4.76  4.22 - 5.81 (MIL/uL)   Hemoglobin  13.5  13.0 - 17.0 (g/dL)   HCT 40.9  81.1 - 91.4 (%)   MCV 86.8  78.0 - 100.0 (fL)   MCH 28.4  26.0 - 34.0 (pg)   MCHC 32.7  30.0 - 36.0 (g/dL)   RDW 78.2  95.6 - 21.3 (%)   Platelets 181  150 - 400 (K/uL)  DIFFERENTIAL      Component Value Range  Neutrophils Relative 66  43 - 77 (%)   Neutro Abs 5.2  1.7 - 7.7 (K/uL)   Lymphocytes Relative 19  12 - 46 (%)   Lymphs Abs 1.5  0.7 - 4.0 (K/uL)   Monocytes Relative 11  3 - 12 (%)   Monocytes Absolute 0.8  0.1 - 1.0 (K/uL)   Eosinophils Relative 4  0 - 5 (%)   Eosinophils Absolute 0.3  0.0 - 0.7 (K/uL)   Basophils Relative 0  0 - 1 (%)   Basophils Absolute 0.0  0.0 - 0.1 (K/uL)  COMPREHENSIVE METABOLIC PANEL      Component Value Range   Sodium 136  135 - 145 (mEq/L)   Potassium 4.4  3.5 - 5.1 (mEq/L)   Chloride 100  96 - 112 (mEq/L)   CO2 24  19 - 32 (mEq/L)   Glucose, Bld 148 (*) 70 - 99 (mg/dL)   BUN 18  6 - 23 (mg/dL)   Creatinine, Ser 1.61  0.50 - 1.35 (mg/dL)   Calcium 9.2  8.4 - 09.6 (mg/dL)   Total Protein 7.2  6.0 - 8.3 (g/dL)   Albumin 3.4 (*) 3.5 - 5.2 (g/dL)   AST 21  0 - 37 (U/L)   ALT 22  0 - 53 (U/L)   Alkaline Phosphatase 79  39 - 117 (U/L)   Total Bilirubin 0.2 (*) 0.3 - 1.2 (mg/dL)   GFR calc non Af Amer 68 (*) >90 (mL/min)   GFR calc Af Amer 79 (*) >90 (mL/min)  LIPASE, BLOOD      Component Value Range   Lipase 71 (*) 11 - 59 (U/L)  URINALYSIS, ROUTINE W REFLEX MICROSCOPIC      Component Value Range   Color, Urine YELLOW  YELLOW    APPearance CLEAR  CLEAR    Specific Gravity, Urine 1.006  1.005 - 1.030    pH 6.5  5.0 - 8.0    Glucose, UA NEGATIVE  NEGATIVE (mg/dL)   Hgb urine dipstick NEGATIVE  NEGATIVE    Bilirubin Urine NEGATIVE  NEGATIVE    Ketones, ur NEGATIVE  NEGATIVE (mg/dL)   Protein, ur NEGATIVE  NEGATIVE (mg/dL)   Urobilinogen, UA 0.2  0.0 - 1.0 (mg/dL)   Nitrite NEGATIVE  NEGATIVE    Leukocytes, UA NEGATIVE  NEGATIVE   POCT I-STAT TROPONIN I      Component Value Range   Troponin i, poc  0.01  0.00 - 0.08 (ng/mL)   Comment 3            Dg Chest 2 View  08/31/2011  *RADIOLOGY REPORT*  Clinical Data: Weakness  CHEST - 2 VIEW  Comparison: 08/22/2011  Findings: The patient is status post median sternotomy CABG procedure.  Heart size is moderately enlarged.  There is no pleural effusion or pulmonary edema.  No airspace consolidation identified.  IMPRESSION:  1.  Cardiac enlargement without evidence for heart failure.  Original Report Authenticated By: Rosealee Albee, M.D.    MDM  1:57 PM Patient reports improved symptoms while waiting in the emergency department. Spouse reports she is a appointment scheduled with his primary care physician tomorrow. Discussed slightly elevated lipase and recommended patient to have this rechecked. Also recommend patient return to ED for abdominal pain, nausea, vomiting, weakness, speech difficulty, behavioral changes. Discussed with plan and are ready for discharge     Thomasene Lot, PA-C 08/31/11 1358

## 2011-08-31 NOTE — ED Provider Notes (Signed)
Medical screening examination/treatment/procedure(s) were conducted as a shared visit with non-physician practitioner(s) and myself.  I personally evaluated the patient during the encounter  Bryona Foxworthy, MD 08/31/11 0755 

## 2011-08-31 NOTE — ED Provider Notes (Signed)
Medical screening examination/treatment/procedure(s) were performed by non-physician practitioner and as supervising physician I was immediately available for consultation/collaboration.  Timouthy Gilardi R. Baleigh Rennaker, MD 08/31/11 1531 

## 2011-08-31 NOTE — ED Notes (Signed)
Urinal at bedside. Pt will try to give sample after xray

## 2011-08-31 NOTE — ED Notes (Signed)
Pt states was weak and not feeling well this morning took extra insulin and called ems.

## 2011-09-01 ENCOUNTER — Ambulatory Visit (INDEPENDENT_AMBULATORY_CARE_PROVIDER_SITE_OTHER): Payer: Medicare Other | Admitting: Endocrinology

## 2011-09-01 ENCOUNTER — Encounter: Payer: Self-pay | Admitting: Endocrinology

## 2011-09-01 DIAGNOSIS — I10 Essential (primary) hypertension: Secondary | ICD-10-CM

## 2011-09-01 DIAGNOSIS — E109 Type 1 diabetes mellitus without complications: Secondary | ICD-10-CM

## 2011-09-01 NOTE — Patient Instructions (Addendum)
Increase nph to 30 units each morning. Take the regular insulin, only 5 units whenever blood sugar is over 300.   check your blood sugar 2 times a day.  vary the time of day when you check, between before the 3 meals, and at bedtime.  also check if you have symptoms of your blood sugar being too high or too low.  please keep a record of the readings and bring it to your next appointment here.  please call us sooner if your blood sugar goes below 70, or if it stays over 200. Stop carvedilol.   Please follow-up with dr Pearlean Brownie.   Please come back for a follow-up appointment for 1 month.  Please make an appointment. i agree with your plan to minimize the hydrocodone, as best you can.   it is critically important to prevent falling down (keep floor areas well-lit, dry, and free of loose objects.  If you have a cane, walker, or wheelchair, you should use it, even for short trips around the house.  Also, try not to rush)

## 2011-09-01 NOTE — Progress Notes (Signed)
Subjective:    Patient ID: Mark French, male    DOB: 27-Jun-1946, 66 y.o.   MRN: [id number]  HPI Pt was in hosp for cerebrovascular dz, 2 weeks ago.  He was then in the ER twice this week. The most recent was for near-syncope.  He has 1 week of moderate dizziness sensation in the head, but no assoc syncope.   He says cbg's are variable.  He has lost weight.   He averages 3 vicodin per day.  Past Medical History  Diagnosis Date  . CORONARY ARTERY DISEASE 03/11/2007  . DIABETES MELLITUS, TYPE I 03/11/2007  . HYPERTENSION 03/11/2007  . BLINDNESS 05/21/2008  . Proliferative diabetic retinopathy 03/09/2009  . HYPERCHOLESTEROLEMIA 09/28/2008  . CERVICAL RADICULOPATHY, LEFT 09/23/2008  . ANEMIA-IRON DEFICIENCY 09/23/2008  . DEPRESSION 09/23/2008  . PERIPHERAL NEUROPATHY 09/23/2008  . MYOCARDIAL INFARCTION, HX OF 09/23/2008  . CONGESTIVE HEART FAILURE 09/23/2008  . RENAL INSUFFICIENCY 03/15/2010  . CEREBROVASCULAR ACCIDENT, HX OF 09/23/2008  . 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 Venous insufficiency of leg   . ED (erectile dysfunction)   . Charcot foot due to diabetes mellitus     chronic pain  . Hypogonadism male   . Congestive heart failure   . Duodenal ulcer 2012    multiple  . GI bleed   . Osteoarthritis   . Stroke     Past Surgical History  Procedure Date  . Coronary artery bypass graft   . Carpal tunnel release     left  . Wrist surgery     s/p right-bone graft fusion  . Spine surgery 2010    C spine surgery for disc Dr. Yetta Barre  . Sigmoidoscopy 03/11/2001  . Venous doppler 01/30/2004  . Cataract surgery   . Ankle surgery     left  . Foot surgery     left    History   Social History  . Marital Status: Married    Spouse Name: N/A    Number of Children: 0  . Years of Education: N/A   Occupational History  . Disabled    Social History Main Topics  . Smoking status: Never Smoker   . Smokeless tobacco: Never Used  . Alcohol Use: No  . Drug  Use: No  . Sexually Active: Not on file   Other Topics Concern  . Not on file   Social History Narrative   Comes to appointments with wife0 Caffeine drinks daily     Current Outpatient Prescriptions on File Prior to Visit  Medication Sig Dispense Refill  . aspirin 325 MG tablet Take 325 mg by mouth daily.      . clotrimazole-betamethasone (LOTRISONE) cream Apply 1 application topically 2 (two) times daily as needed. For rash      . Docusate Sodium (STOOL SOFTENER) 100 MG capsule Take 100 mg by mouth daily as needed. constipation      . gabapentin (NEURONTIN) 300 MG capsule Take 300 mg by mouth 2 (two) times daily.      [name] HYDROcodone-acetaminophen (NORCO) 10-325 MG per tablet Take 1 tablet by mouth every 4 (four) hours as needed. For pain      . insulin NPH (HUMULIN N,NOVOLIN N) 100 UNIT/ML injection Inject 30 Units into the skin every morning.       . Insulin Syringe-Needle U-100 (INSULIN SYRINGE .5CC/31GX5/16") 31G X 5/16" 0.5 ML MISC As directed      . losartan (COZAAR) 50 MG tablet Take 50 mg by mouth  daily.      . omeprazole (PRILOSEC) 20 MG capsule Take 20 mg by mouth daily.        Allergies  Allergen Reactions  . Contrast Media (Iodinated Diagnostic Agents) Nausea Only  . Sildenafil Nausea Only and Other (See Comments)    Family History  Problem Relation Age of Onset  . Heart disease Mother     Coronary Artery Disease  . Heart disease Father     Coronary Artery Disease  . Heart disease Paternal Grandmother     Coronary Artery Disease  . Colon cancer Neg Hx   . Diabetes Other     Grandmother    BP 132/70  Pulse 62  Temp(Src) 97 F (36.1 C) (Oral)  SpO2 95%  Review of Systems Denies n/v/fever.    Objective:   Physical Exam Vital signs: see vs page Gen: frail, no distress Gait: slow but steady, with a cane.       Assessment & Plan:  HTN, overcontrolled Chronic pain syndrome, due to OA and neuropathy.  vicodin could contribute to dizziness. DM.  He needs  to return to the simple regimen he was on prior to recent illness. Near-syncope, which has multiple possible contributing causes.

## 2011-09-07 ENCOUNTER — Other Ambulatory Visit: Payer: Self-pay

## 2011-09-07 ENCOUNTER — Inpatient Hospital Stay (HOSPITAL_COMMUNITY)
Admission: EM | Admit: 2011-09-07 | Discharge: 2011-09-15 | DRG: 246 | Disposition: A | Discharge status: Skilled Nursing Facility | Payer: Medicare Other | Source: Ambulatory Visit | Attending: Cardiovascular Disease | Admitting: Cardiovascular Disease

## 2011-09-07 ENCOUNTER — Encounter (HOSPITAL_COMMUNITY): Payer: Self-pay | Admitting: Emergency Medicine

## 2011-09-07 ENCOUNTER — Encounter (HOSPITAL_COMMUNITY)
Admission: EM | Disposition: A | Discharge status: Skilled Nursing Facility | Payer: Self-pay | Source: Ambulatory Visit | Attending: Cardiovascular Disease

## 2011-09-07 ENCOUNTER — Emergency Department (HOSPITAL_COMMUNITY): Payer: Medicare Other

## 2011-09-07 DIAGNOSIS — I213 ST elevation (STEMI) myocardial infarction of unspecified site: Secondary | ICD-10-CM

## 2011-09-07 DIAGNOSIS — I5023 Acute on chronic systolic (congestive) heart failure: Secondary | ICD-10-CM | POA: Diagnosis present

## 2011-09-07 DIAGNOSIS — I469 Cardiac arrest, cause unspecified: Secondary | ICD-10-CM | POA: Diagnosis present

## 2011-09-07 DIAGNOSIS — F329 Major depressive disorder, single episode, unspecified: Secondary | ICD-10-CM | POA: Diagnosis present

## 2011-09-07 DIAGNOSIS — R339 Retention of urine, unspecified: Secondary | ICD-10-CM | POA: Diagnosis not present

## 2011-09-07 DIAGNOSIS — E1139 Type 2 diabetes mellitus with other diabetic ophthalmic complication: Secondary | ICD-10-CM | POA: Diagnosis present

## 2011-09-07 DIAGNOSIS — I509 Heart failure, unspecified: Secondary | ICD-10-CM

## 2011-09-07 DIAGNOSIS — B372 Candidiasis of skin and nail: Secondary | ICD-10-CM | POA: Diagnosis present

## 2011-09-07 DIAGNOSIS — I739 Peripheral vascular disease, unspecified: Secondary | ICD-10-CM | POA: Diagnosis present

## 2011-09-07 DIAGNOSIS — F3289 Other specified depressive episodes: Secondary | ICD-10-CM | POA: Diagnosis present

## 2011-09-07 DIAGNOSIS — I25810 Atherosclerosis of coronary artery bypass graft(s) without angina pectoris: Secondary | ICD-10-CM | POA: Diagnosis present

## 2011-09-07 DIAGNOSIS — E669 Obesity, unspecified: Secondary | ICD-10-CM | POA: Diagnosis present

## 2011-09-07 DIAGNOSIS — E871 Hypo-osmolality and hyponatremia: Secondary | ICD-10-CM | POA: Diagnosis not present

## 2011-09-07 DIAGNOSIS — E1149 Type 2 diabetes mellitus with other diabetic neurological complication: Secondary | ICD-10-CM | POA: Diagnosis present

## 2011-09-07 DIAGNOSIS — I2510 Atherosclerotic heart disease of native coronary artery without angina pectoris: Secondary | ICD-10-CM

## 2011-09-07 DIAGNOSIS — I10 Essential (primary) hypertension: Secondary | ICD-10-CM | POA: Diagnosis present

## 2011-09-07 DIAGNOSIS — I442 Atrioventricular block, complete: Secondary | ICD-10-CM | POA: Diagnosis not present

## 2011-09-07 DIAGNOSIS — Z794 Long term (current) use of insulin: Secondary | ICD-10-CM

## 2011-09-07 DIAGNOSIS — Z8673 Personal history of transient ischemic attack (TIA), and cerebral infarction without residual deficits: Secondary | ICD-10-CM

## 2011-09-07 DIAGNOSIS — R5381 Other malaise: Secondary | ICD-10-CM

## 2011-09-07 DIAGNOSIS — I2119 ST elevation (STEMI) myocardial infarction involving other coronary artery of inferior wall: Principal | ICD-10-CM

## 2011-09-07 DIAGNOSIS — Z7982 Long term (current) use of aspirin: Secondary | ICD-10-CM

## 2011-09-07 DIAGNOSIS — E109 Type 1 diabetes mellitus without complications: Secondary | ICD-10-CM

## 2011-09-07 DIAGNOSIS — E785 Hyperlipidemia, unspecified: Secondary | ICD-10-CM | POA: Diagnosis present

## 2011-09-07 DIAGNOSIS — M1990 Unspecified osteoarthritis, unspecified site: Secondary | ICD-10-CM | POA: Diagnosis present

## 2011-09-07 DIAGNOSIS — I369 Nonrheumatic tricuspid valve disorder, unspecified: Secondary | ICD-10-CM

## 2011-09-07 DIAGNOSIS — E119 Type 2 diabetes mellitus without complications: Secondary | ICD-10-CM

## 2011-09-07 DIAGNOSIS — Z79899 Other long term (current) drug therapy: Secondary | ICD-10-CM

## 2011-09-07 DIAGNOSIS — I252 Old myocardial infarction: Secondary | ICD-10-CM

## 2011-09-07 DIAGNOSIS — E1142 Type 2 diabetes mellitus with diabetic polyneuropathy: Secondary | ICD-10-CM | POA: Diagnosis present

## 2011-09-07 DIAGNOSIS — E11359 Type 2 diabetes mellitus with proliferative diabetic retinopathy without macular edema: Secondary | ICD-10-CM | POA: Diagnosis present

## 2011-09-07 DIAGNOSIS — H543 Unqualified visual loss, both eyes: Secondary | ICD-10-CM | POA: Diagnosis present

## 2011-09-07 DIAGNOSIS — I872 Venous insufficiency (chronic) (peripheral): Secondary | ICD-10-CM | POA: Diagnosis present

## 2011-09-07 DIAGNOSIS — B964 Proteus (mirabilis) (morganii) as the cause of diseases classified elsewhere: Secondary | ICD-10-CM | POA: Diagnosis not present

## 2011-09-07 DIAGNOSIS — N390 Urinary tract infection, site not specified: Secondary | ICD-10-CM | POA: Diagnosis not present

## 2011-09-07 HISTORY — PX: LEFT HEART CATHETERIZATION WITH CORONARY/GRAFT ANGIOGRAM: SHX5450

## 2011-09-07 LAB — COMPREHENSIVE METABOLIC PANEL
AST: 121 U/L — ABNORMAL HIGH (ref 0–37)
BUN: 16 mg/dL (ref 6–23)
CO2: 26 mEq/L (ref 19–32)
Chloride: 96 mEq/L (ref 96–112)
Creatinine, Ser: 1.05 mg/dL (ref 0.50–1.35)
GFR calc Af Amer: 83 mL/min — ABNORMAL LOW (ref >90)
GFR calc non Af Amer: 71 mL/min — ABNORMAL LOW (ref >90)
Glucose, Bld: 156 mg/dL — ABNORMAL HIGH (ref 70–99)
Total Bilirubin: 0.3 mg/dL (ref 0.3–1.2)

## 2011-09-07 LAB — BASIC METABOLIC PANEL
Calcium: 8.5 mg/dL (ref 8.4–10.5)
Creatinine, Ser: 0.91 mg/dL (ref 0.50–1.35)
GFR calc Af Amer: >90 mL/min (ref >90)
Sodium: 133 mEq/L — ABNORMAL LOW (ref 135–145)

## 2011-09-07 LAB — CBC
HCT: 40.7 % (ref 39.0–52.0)
Hemoglobin: 13.3 g/dL (ref 13.0–17.0)
MCV: 86 fL (ref 78.0–100.0)
Platelets: 189 10*3/uL (ref 150–400)
RBC: 4.73 MIL/uL (ref 4.22–5.81)
WBC: 10.2 10*3/uL (ref 4.0–10.5)

## 2011-09-07 LAB — GLUCOSE, CAPILLARY
Glucose-Capillary: 166 mg/dL — ABNORMAL HIGH (ref 70–99)
Glucose-Capillary: 149 mg/dL — ABNORMAL HIGH (ref 70–99)
Glucose-Capillary: 176 mg/dL — ABNORMAL HIGH (ref 70–99)
Glucose-Capillary: 225 mg/dL — ABNORMAL HIGH (ref 70–99)

## 2011-09-07 LAB — POCT I-STAT, CHEM 8
BUN: 19 mg/dL (ref 6–23)
Calcium, Ion: 1.13 mmol/L (ref 1.12–1.32)
TCO2: 27 mmol/L (ref 0–100)

## 2011-09-07 LAB — MRSA PCR SCREENING: MRSA by PCR: NEGATIVE

## 2011-09-07 LAB — PROTIME-INR: INR: 0.95 (ref 0.00–1.49)

## 2011-09-07 LAB — PRO B NATRIURETIC PEPTIDE: Pro B Natriuretic peptide (BNP): 1365 pg/mL — ABNORMAL HIGH (ref 0–125)

## 2011-09-07 LAB — CARDIAC PANEL(CRET KIN+CKTOT+MB+TROPI): Total CK: 2428 U/L — ABNORMAL HIGH (ref 7–232)

## 2011-09-07 LAB — POCT I-STAT TROPONIN I: Troponin i, poc: 13.49 ng/mL (ref 0.00–0.08)

## 2011-09-07 SURGERY — LEFT HEART CATHETERIZATION WITH CORONARY/GRAFT ANGIOGRAM
Anesthesia: Moderate Sedation

## 2011-09-07 MED ORDER — ONDANSETRON HCL 4 MG/2ML IJ SOLN
4.0000 mg | Freq: Four times a day (QID) | INTRAMUSCULAR | Status: DC | PRN
  Administered 2011-09-07 – 2011-09-10 (×4): 4 mg via INTRAVENOUS
  Filled 2011-09-07 (×3): qty 2

## 2011-09-07 MED ORDER — HYDROCODONE-ACETAMINOPHEN 10-325 MG PO TABS
1.0000 | ORAL_TABLET | ORAL | Status: DC | PRN
  Administered 2011-09-09 – 2011-09-12 (×9): 1 via ORAL
  Filled 2011-09-07 (×9): qty 1

## 2011-09-07 MED ORDER — ALPRAZOLAM 0.25 MG PO TABS
0.2500 mg | ORAL_TABLET | Freq: Two times a day (BID) | ORAL | Status: DC | PRN
  Administered 2011-09-07 – 2011-09-10 (×2): 0.25 mg via ORAL
  Filled 2011-09-07 (×2): qty 1

## 2011-09-07 MED ORDER — NITROGLYCERIN 0.2 MG/ML ON CALL CATH LAB
INTRAVENOUS | Status: AC
  Filled 2011-09-07: qty 1

## 2011-09-07 MED ORDER — SODIUM CHLORIDE 0.9 % IJ SOLN
3.0000 mL | Freq: Two times a day (BID) | INTRAMUSCULAR | Status: DC
Start: 2011-09-07 — End: 2011-09-15
  Administered 2011-09-07 – 2011-09-15 (×14): 3 mL via INTRAVENOUS

## 2011-09-07 MED ORDER — HEPARIN (PORCINE) IN NACL 2-0.9 UNIT/ML-% IJ SOLN
INTRAMUSCULAR | Status: AC
  Filled 2011-09-07: qty 2000

## 2011-09-07 MED ORDER — CLOPIDOGREL BISULFATE 300 MG PO TABS
ORAL_TABLET | ORAL | Status: AC
  Administered 2011-09-07: 75 mg via ORAL
  Filled 2011-09-07: qty 2

## 2011-09-07 MED ORDER — LIDOCAINE HCL (PF) 1 % IJ SOLN
INTRAMUSCULAR | Status: AC
  Filled 2011-09-07: qty 30

## 2011-09-07 MED ORDER — SODIUM CHLORIDE 0.9 % IJ SOLN: 3.0000 mL | INTRAMUSCULAR | Status: DC | PRN

## 2011-09-07 MED ORDER — NITROGLYCERIN IN D5W 200-5 MCG/ML-% IV SOLN
2.0000 ug/min | INTRAVENOUS | Status: DC
  Administered 2011-09-07: 10 ug/min via INTRAVENOUS
  Administered 2011-09-08: 20 ug/min via INTRAVENOUS
  Filled 2011-09-07: qty 250

## 2011-09-07 MED ORDER — CLOTRIMAZOLE 1 % EX CREA
TOPICAL_CREAM | Freq: Two times a day (BID) | CUTANEOUS | Status: DC
  Administered 2011-09-08: 1 via TOPICAL
  Administered 2011-09-08 – 2011-09-12 (×8): via TOPICAL
  Administered 2011-09-12: 1 via TOPICAL
  Filled 2011-09-07: qty 15

## 2011-09-07 MED ORDER — HEPARIN BOLUS VIA INFUSION
4000.0000 [IU] | Freq: Once | INTRAVENOUS | Status: AC
  Administered 2011-09-07: 4000 [IU] via INTRAVENOUS

## 2011-09-07 MED ORDER — PANTOPRAZOLE SODIUM 40 MG PO TBEC
40.0000 mg | DELAYED_RELEASE_TABLET | Freq: Every day | ORAL | Status: DC
  Administered 2011-09-08 – 2011-09-15 (×8): 40 mg via ORAL
  Filled 2011-09-07 (×7): qty 1

## 2011-09-07 MED ORDER — BIVALIRUDIN 250 MG IV SOLR
INTRAVENOUS | Status: AC
  Filled 2011-09-07: qty 250

## 2011-09-07 MED ORDER — INSULIN NPH (HUMAN) (ISOPHANE) 100 UNIT/ML ~~LOC~~ SUSP
30.0000 [IU] | SUBCUTANEOUS | Status: DC
  Administered 2011-09-08 (×2): 15 [IU] via SUBCUTANEOUS
  Administered 2011-09-09: 30 [IU] via SUBCUTANEOUS
  Filled 2011-09-07 (×2): qty 10

## 2011-09-07 MED ORDER — MIDAZOLAM HCL 2 MG/2ML IJ SOLN
INTRAMUSCULAR | Status: AC
  Filled 2011-09-07: qty 2

## 2011-09-07 MED ORDER — ACETAMINOPHEN 325 MG PO TABS: 650.0000 mg | ORAL_TABLET | ORAL | Status: DC | PRN

## 2011-09-07 MED ORDER — ASPIRIN 325 MG PO TABS
325.0000 mg | ORAL_TABLET | Freq: Every day | ORAL | Status: DC
  Filled 2011-09-07: qty 1

## 2011-09-07 MED ORDER — PROMETHAZINE HCL 25 MG/ML IJ SOLN
6.2500 mg | Freq: Four times a day (QID) | INTRAMUSCULAR | Status: DC | PRN
  Administered 2011-09-07 – 2011-09-10 (×2): 6.25 mg via INTRAVENOUS
  Filled 2011-09-07 (×2): qty 1

## 2011-09-07 MED ORDER — NITROGLYCERIN IN D5W 200-5 MCG/ML-% IV SOLN
INTRAVENOUS | Status: AC
  Filled 2011-09-07: qty 250

## 2011-09-07 MED ORDER — FUROSEMIDE 10 MG/ML IJ SOLN
40.0000 mg | Freq: Two times a day (BID) | INTRAMUSCULAR | Status: DC
  Administered 2011-09-07 (×2): 40 mg via INTRAVENOUS
  Filled 2011-09-07 (×5): qty 4

## 2011-09-07 MED ORDER — MORPHINE SULFATE 2 MG/ML IJ SOLN: 1.0000 mg | INTRAMUSCULAR | Status: DC | PRN

## 2011-09-07 MED ORDER — DOCUSATE SODIUM 100 MG PO CAPS
100.0000 mg | ORAL_CAPSULE | Freq: Every day | ORAL | Status: DC | PRN
  Filled 2011-09-07: qty 1

## 2011-09-07 MED ORDER — DOCUSATE SODIUM 100 MG PO TABS: 100.0000 mg | ORAL_TABLET | Freq: Every day | ORAL | Status: DC | PRN

## 2011-09-07 MED ORDER — MORPHINE SULFATE 4 MG/ML IJ SOLN
4.0000 mg | Freq: Once | INTRAMUSCULAR | Status: AC
  Administered 2011-09-07: 4 mg via INTRAVENOUS
  Filled 2011-09-07: qty 1

## 2011-09-07 MED ORDER — LOSARTAN POTASSIUM 50 MG PO TABS
50.0000 mg | ORAL_TABLET | Freq: Every day | ORAL | Status: DC
  Administered 2011-09-08 – 2011-09-12 (×5): 50 mg via ORAL
  Filled 2011-09-07 (×6): qty 1

## 2011-09-07 MED ORDER — PNEUMOCOCCAL VAC POLYVALENT 25 MCG/0.5ML IJ INJ
0.5000 mL | INJECTION | INTRAMUSCULAR | Status: AC
  Administered 2011-09-08: 0.5 mL via INTRAMUSCULAR
  Filled 2011-09-07: qty 0.5

## 2011-09-07 MED ORDER — ONDANSETRON HCL 4 MG/2ML IJ SOLN: 4.0000 mg | Freq: Once | INTRAMUSCULAR | Status: DC

## 2011-09-07 MED ORDER — FENTANYL CITRATE 0.05 MG/ML IJ SOLN
INTRAMUSCULAR | Status: AC
  Filled 2011-09-07: qty 2

## 2011-09-07 MED ORDER — SODIUM CHLORIDE 0.9 % IV SOLN: 250.0000 mL | INTRAVENOUS | Status: DC

## 2011-09-07 MED ORDER — ROSUVASTATIN CALCIUM 40 MG PO TABS
40.0000 mg | ORAL_TABLET | Freq: Every day | ORAL | Status: DC
  Administered 2011-09-08 – 2011-09-14 (×8): 40 mg via ORAL
  Filled 2011-09-07 (×9): qty 1

## 2011-09-07 MED ORDER — SODIUM CHLORIDE 0.9 % IV SOLN
0.2500 mg/kg/h | INTRAVENOUS | Status: AC
  Filled 2011-09-07: qty 250

## 2011-09-07 MED ORDER — CLOPIDOGREL BISULFATE 75 MG PO TABS
75.0000 mg | ORAL_TABLET | Freq: Every day | ORAL | Status: DC
  Administered 2011-09-07 – 2011-09-15 (×9): 75 mg via ORAL
  Filled 2011-09-07 (×12): qty 1

## 2011-09-07 MED ORDER — ONDANSETRON HCL 4 MG/2ML IJ SOLN
4.0000 mg | Freq: Four times a day (QID) | INTRAMUSCULAR | Status: DC | PRN
  Filled 2011-09-07: qty 2
  Filled 2011-09-07: qty 4

## 2011-09-07 MED ORDER — HEPARIN SOD (PORCINE) IN D5W 100 UNIT/ML IV SOLN
12.0000 [IU]/kg/h | INTRAVENOUS | Status: DC
  Administered 2011-09-07: 12 [IU]/kg/h via INTRAVENOUS
  Filled 2011-09-07: qty 250

## 2011-09-07 MED ORDER — GABAPENTIN 300 MG PO CAPS
300.0000 mg | ORAL_CAPSULE | Freq: Two times a day (BID) | ORAL | Status: DC
  Administered 2011-09-07 – 2011-09-15 (×16): 300 mg via ORAL
  Filled 2011-09-07 (×18): qty 1

## 2011-09-07 NOTE — ED Notes (Signed)
Patria Mane, MD aware of troponin results.

## 2011-09-07 NOTE — Progress Notes (Signed)
Patient ID: Mark French, male   DOB: Oct 15, 1945, 66 y.o.   MRN: [id number]   Results of cath noted.  Chest pain now resolved.  Suspect he will complete an MI (likely culprit was SVG-RCA or SVG-OM, which were occluded proximally and could not be crossed).  He is volume overloaded on exam with elevated JVP and wheezing.  - Lasix 40 mg IV bid - Add high dose statin - Echo today for LV function - Hold off on beta blocker with recent CHB. May try to initiate tomorrow.  - May remove temporary PCM, has not needed (CHB when LV was entered during cath).  - Repeat BMET, cardiac enzymes.   Marca Ancona 09/07/2011 9:29 AM

## 2011-09-07 NOTE — H&P (Signed)
Physician History and Physical    COLBIE DANNER MRN: [id number] DOB/AGE: Nov 17, 1945 66 y.o. Admit date: 09/07/2011  Primary Care Physician: Everardo All Primary Cardiologist:  Excell Seltzer  HPI: 66 yo with history of CAD s/p CABG in 1998 reports central chest tightness for almost 2 days.  The pain started on Wednesday and has waxed and waned since then.  Tonight it became worse, so he came to the ER.  He was noted to have inferior STEMI and sent for cath.  Point of care troponin was 13.49.    He has not had a cath since 1998.  He follows up with Dr. Excell Seltzer for stable PAD.  He has chronic lower extremity edema with venous stasis changes.  He had not had chest pain prior to Wednesday.  At baseline, he is rather debilitated due to diabetic neuropathy, Charcot foot, and osteoarthritis. He gets around in a motorized scooter.   Review of systems complete and found to be negative unless listed above   Past Medical History: 1. CAD: s/p CABG x 5 in 1998.  No cath since that time.  2. PAD: ABIs 9/12 with 0.8 on right, 0.75 on left.  3. DM 4. Obesity 5. OA 6. Obese 7. HTN 8. Depression 9. Venous insufficiency 10. Diastolic CHF: Echo (1/13) with Ef 50-55%, inferobasal hypokinesis 11. Hyperlipidemia 12. PUD: h/o upper GI bleed in 5/12  Current Facility-Administered Medications  Medication Dose Route Frequency Provider Last Rate Last Dose  . heparin ADULT infusion 100 units/ml (25000 units/250 ml)  12 Units/kg/hr Intravenous Continuous Lyanne Co, MD 12.5 mL/hr at 09/07/11 0313 12 Units/kg/hr at 09/07/11 0313  . heparin bolus via infusion 4,000 Units  4,000 Units Intravenous Once Lyanne Co, MD   4,000 Units at 09/07/11 0313  . morphine 4 MG/ML injection 4 mg  4 mg Intravenous Once Lyanne Co, MD   4 mg at 09/07/11 1478   Current Outpatient Prescriptions  Medication Sig Dispense Refill  . aspirin 325 MG tablet Take 325 mg by mouth daily.      . clotrimazole-betamethasone (LOTRISONE) cream  Apply 1 application topically 2 (two) times daily as needed. For rash      . Docusate Sodium (STOOL SOFTENER) 100 MG capsule Take 100 mg by mouth daily as needed. constipation      . gabapentin (NEURONTIN) 300 MG capsule Take 300 mg by mouth 2 (two) times daily.      [name] HYDROcodone-acetaminophen (NORCO) 10-325 MG per tablet Take 1 tablet by mouth every 4 (four) hours as needed. For pain      . insulin NPH (HUMULIN N,NOVOLIN N) 100 UNIT/ML injection Inject 30 Units into the skin every morning.       . Insulin Syringe-Needle U-100 (INSULIN SYRINGE .5CC/31GX5/16") 31G X 5/16" 0.5 ML MISC As directed      . losartan (COZAAR) 50 MG tablet Take 50 mg by mouth daily.      [name] omeprazole (PRILOSEC) 20 MG capsule Take 20 mg by mouth daily.           Family History  Problem Relation Age of Onset  . Heart disease Mother     Coronary Artery Disease  . Heart disease Father     Coronary Artery Disease  . Heart disease Paternal Grandmother     Coronary Artery Disease  . Colon cancer Neg Hx   . Diabetes Other     Grandmother    History   Social History  . Marital Status: Married  Spouse Name: N/A    Number of Children: 0  . Years of Education: N/A   Occupational History  . Disabled    Social History Main Topics  . Smoking status: Never Smoker   . Smokeless tobacco: Never Used  . Alcohol Use: No  . Drug Use: No  . Sexually Active: Not on file   Other Topics Concern  . Not on file   Social History Narrative   Comes to appointments with wife0 Caffeine drinks daily       (Not in a hospital admission)  Physical Exam: Blood pressure 142/115, pulse 72, temperature 98.3 F (36.8 C), temperature source Oral, resp. rate 16, weight 106.142 kg (234 lb), SpO2 100.00%.  General: NAD, obese Neck: Thick, JVP difficult, no thyromegaly or thyroid nodule.  Lungs: Clear to auscultation bilaterally with normal respiratory effort. CV: Nondisplaced PMI.  Heart regular S1/S2, no S3/S4, no murmur.  1+ chronic lower extremity edema to knees.  No carotid bruit. Unable to palpate pedal pulses.  Abdomen: Soft, nontender, no hepatosplenomegaly, no distention.  Skin: Chronic venous stasis changes lower legs  Neurologic: Alert and oriented x 3.  Psych: Normal affect. Extremities: No clubbing or cyanosis.  HEENT: Normal.   Labs:   Lab Results  Component Value Date   WBC 10.2 09/07/2011   HGB 15.0 09/07/2011   HCT 44.0 09/07/2011   MCV 86.0 09/07/2011   PLT 189 09/07/2011    Lab 09/07/11 0306 09/07/11 0255  NA 135 --  K 4.8 --  CL 101 --  CO2 -- 26  BUN 19 --  CREATININE 1.10 --  CALCIUM -- 9.2  PROT -- 7.6  BILITOT -- 0.3  ALKPHOS -- 86  ALT -- 34  AST -- 121*  GLUCOSE 160* --   Troponin 13.49  Radiology: CXR with pulmonary vascular congestion  EKG: NSR, RBBB, acute inferior MI with new ST depression in V1 and V2.   ASSESSMENT AND PLAN:  Acute inferior MI.  Patient will be transported to the cath lab for PCI.    Signed: Marca Ancona 09/07/2011, 3:43 AM Co-Sign MD

## 2011-09-07 NOTE — Progress Notes (Signed)
CRITICAL VALUE ALERT  Critical value received: ckmb/trop  Date of notification:  09/07/2011  Time of notification:  1025  Critical value read backyes  Nurse who received alert:  Tammy Sours  MD notified (1st page):  mclean  Time of first page:  1028  MD notified (2nd page):  Time of second page:  Responding MD:  mclean  Time MD responded:  1030

## 2011-09-07 NOTE — ED Provider Notes (Signed)
History     CSN: [id number]  Arrival date & time 09/07/11  0240   First MD Initiated Contact with Patient 09/07/11 0250      Chief Complaint  Patient presents with  . Chest Pain     Patient is a 66 y.o. male presenting with chest pain. The history is provided by the patient.  Chest Pain    the patient reports development of acute pain in his left chest with radiation to his left shoulder that began approximately 12-14 hours ago.  He is a known history of coronary artery disease.  He had a bypass surgery in 1998.  He reports his cardiologist is Dr. Tonny Bollman.  He denies fever cough or congestion.  He denies new lower extremity edema.  He reports is intermittently had chest pain and left shoulder pain throughout the week however his been transient.  He reports it became acutely worse 1214 hours ago.  Nothing seems to improve his pain.  Nothing worsens his pain.  His pain has been constant since then  Past Medical History  Diagnosis Date  . CORONARY ARTERY DISEASE 03/11/2007  . DIABETES MELLITUS, TYPE I 03/11/2007  . HYPERTENSION 03/11/2007  . BLINDNESS 05/21/2008  . Proliferative diabetic retinopathy 03/09/2009  . HYPERCHOLESTEROLEMIA 09/28/2008  . CERVICAL RADICULOPATHY, LEFT 09/23/2008  . ANEMIA-IRON DEFICIENCY 09/23/2008  . DEPRESSION 09/23/2008  . PERIPHERAL NEUROPATHY 09/23/2008  . MYOCARDIAL INFARCTION, HX OF 09/23/2008  . CONGESTIVE HEART FAILURE 09/23/2008  . RENAL INSUFFICIENCY 03/15/2010  . CEREBROVASCULAR ACCIDENT, HX OF 09/23/2008  . 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 Venous insufficiency of leg   . ED (erectile dysfunction)   . Charcot foot due to diabetes mellitus     chronic pain  . Hypogonadism male   . Congestive heart failure   . Duodenal ulcer 2012    multiple  . GI bleed   . Osteoarthritis   . Stroke     Past Surgical History  Procedure Date  . Coronary artery bypass graft   . Carpal tunnel release     left  . Wrist surgery     s/p right-bone graft fusion  . Spine surgery 2010    C spine surgery for disc Dr. Yetta Barre  . Sigmoidoscopy 03/11/2001  . Venous doppler 01/30/2004  . Cataract surgery   . Ankle surgery     left  . Foot surgery     left    Family History  Problem Relation Age of Onset  . Heart disease Mother     Coronary Artery Disease  . Heart disease Father     Coronary Artery Disease  . Heart disease Paternal Grandmother     Coronary Artery Disease  . Colon cancer Neg Hx   . Diabetes Other     Grandmother    History  Substance Use Topics  . Smoking status: Never Smoker   . Smokeless tobacco: Never Used  . Alcohol Use: No      Review of Systems  Cardiovascular: Positive for chest pain.  All other systems reviewed and are negative.    Allergies  Contrast media and Sildenafil  Home Medications   Current Outpatient Rx  Name Route Sig Dispense Refill  . ASPIRIN 325 MG PO TABS Oral Take 325 mg by mouth daily.    [name] CLOTRIMAZOLE-BETAMETHASONE 1-0.05 % EX CREA Topical Apply 1 application topically 2 (two) times daily as needed. For rash    . DOCUSATE SODIUM 100 MG PO TABS Oral Take  100 mg by mouth daily as needed. constipation    . GABAPENTIN 300 MG PO CAPS Oral Take 300 mg by mouth 2 (two) times daily.    [name] HYDROCODONE-ACETAMINOPHEN 10-325 MG PO TABS Oral Take 1 tablet by mouth every 4 (four) hours as needed. For pain    . INSULIN ISOPHANE HUMAN 100 UNIT/ML [place] SUSP Subcutaneous Inject 30 Units into the skin every morning.     . INSULIN SYRINGE 31G X 5/16" 0.5 ML MISC  As directed    . LOSARTAN POTASSIUM 50 MG PO TABS Oral Take 50 mg by mouth daily.    [name] OMEPRAZOLE 20 MG PO CPDR Oral Take 20 mg by mouth daily.      BP 142/115  Pulse 72  Temp(Src) 98.3 F (36.8 C) (Oral)  Resp 16  Wt 234 lb (106.142 kg)  SpO2 100%  Physical Exam  Nursing note and vitals reviewed. Constitutional: He is oriented to person, place, and time. He appears well-developed and well-nourished.  HENT:    Head: Normocephalic and atraumatic.  Eyes: EOM are normal.  Neck: Normal range of motion.  Cardiovascular: Normal rate, regular rhythm, normal heart sounds and intact distal pulses.   Pulmonary/Chest: Effort normal and breath sounds normal. No respiratory distress.  Abdominal: Soft. He exhibits no distension. There is no tenderness.  Musculoskeletal: Normal range of motion.  Neurological: He is alert and oriented to person, place, and time.  Skin: Skin is warm and dry.  Psychiatric: He has a normal mood and affect. Judgment normal.    ED Course  Procedures (including critical care time)  ECG  Date: 09/07/2011  Rate: 72  Rhythm: Normal sinus rhythm  QRS Axis: RBBB  Intervals: normal  ST/T Wave abnormalities: ST elevation in his inferior lateral leads. ST depression in V1-2  Conduction Disutrbances: none  Narrative Interpretation: small q waves in inferior leads  Old EKG Reviewed: changed from prior ecg (16109)    CRITICAL CARE Performed by: Lyanne Co Total critical care time: 30 Critical care time was exclusive of separately billable procedures and treating other patients. Critical care was necessary to treat or prevent imminent or life-threatening deterioration. Critical care was time spent personally by me on the following activities: development of treatment plan with patient and/or surrogate as well as nursing, discussions with consultants, evaluation of patient's response to treatment, examination of patient, obtaining history from patient or surrogate, ordering and performing treatments and interventions, ordering and review of laboratory studies, ordering and review of radiographic studies, pulse oximetry and re-evaluation of patient's condition.    Labs Reviewed  POCT I-STAT, CHEM 8 - Abnormal; Notable for the following:    Glucose, Bld 160 (*)    All other components within normal limits  POCT I-STAT TROPONIN I - Abnormal; Notable for the following:     Troponin i, poc 13.49 (*)    All other components within normal limits  CBC  COMPREHENSIVE METABOLIC PANEL  PROTIME-INR   Dg Chest Portable 1 View  09/07/2011  *RADIOLOGY REPORT*  Clinical Data: Chest pain  PORTABLE CHEST - 1 VIEW  Comparison:  08/31/2011  Findings: Stable postoperative changes in the mediastinum.  Cardiac enlargement with mild prominence of pulmonary vascularity.  Hazy appearance of the heart and pulmonary vascularity may represent motion artifact versus early edema.  No blunting of costophrenic angles.  No pneumothorax.  No focal airspace consolidation. Postoperative change in the base of the neck.  IMPRESSION:  Cardiac enlargement with pulmonary vascular congestion and suggestion of  early edema versus motion artifact in the lungs.  Original Report Authenticated By: Marlon Pel, M.D.   I personally reviewed the xray  1. STEMI (ST elevation myocardial infarction)       MDM  Code stemi called at 0255  His pain has been present for approximately 12-14 hours.  He has inferior lateral ST elevation that is new since his EKG on 08/31/2011.  He still is ongoing pain at this time.  He took 325 mg aspirin just prior to arrival.  Given a heparin bolus and started on a heparin drip in the emergency department.  I spoke with interventional cardiologist and the patient is currently being transferred to [facility] cone catheter lab at 0319.         Lyanne Co, MD 09/07/11 [phone]

## 2011-09-07 NOTE — ED Provider Notes (Signed)
  Physical Exam  BP 142/115  Pulse 72  Temp(Src) 98.3 F (36.8 C) (Oral)  Resp 16  Wt 234 lb (106.142 kg)  SpO2 100% Screened awaiting cath lab.  3 days of CP, mild nausea on Wednesday.   Physical Exam RRR Dimished BS B NABS GCS 15 AO3  ED Course  Procedures  MDM Stemi to the cath lab       Mark Forton K Ebony Yorio-Rasch, MD 09/07/11 [phone]

## 2011-09-07 NOTE — Progress Notes (Signed)
09/07/11 0400  Clinical Encounter Type  Visited With Family  Visit Type Spiritual support;Code  Spiritual Encounters  Spiritual Needs Prayer;Emotional  Stress Factors  Family Stress Factors Other (Comment) (ongoing health issues)    Chaplain's Note: 03:03am responded to Code Stemi (arrived 03:30am).  Introduced self to pt wife and provided pastoral presence and support.  Offered prayer which wife accepted.  Informed staff of pt wife location in consult rm.  Pt wife appreciated support and prayer.  Will follow-up as needed or requested.  [phone]

## 2011-09-07 NOTE — ED Notes (Signed)
Pt alert, c/o generalized chest pain, onset several days ago, states pain non radiating sharp on left chest wall, states pain increases with inspiration, denies nausea or emesis, resp even unlabored, skin pwd, states took ASA 325mg  PTA

## 2011-09-07 NOTE — Progress Notes (Signed)
2D Echo completed.  Aneita Kiger Johnson, RDCS 

## 2011-09-07 NOTE — Progress Notes (Signed)
95fr rt fem and rt venous sheaths removed by manual pressure. Held pressure for 20 min.Skin at rt groin was tissue paper thin and yeasty. Nurse is aware. Pressure dressing applied and pt. Instructed. No problems or complications. Nurse observed rt groin site and noted a level 0, which I agree with. Rt dp 1+//feet were warm, dry, and reddened. Bp 116/68, Hr 72 w/bbb. --------------------------------------------------------------------------TPMink RTR/RCIS  Cardiac Cath Lab

## 2011-09-07 NOTE — ED Notes (Signed)
Report to Gregary Signs, RN of [facility]. [facility] here to transport pt. Portable X-ray at bedside.

## 2011-09-07 NOTE — Op Note (Signed)
PROCEDURE:  Left heart catheterization with selective coronary angiography, left ventriculogram.  SVG angio; arterial conduit angiogram; PCI SVG; Temporary pacer implantation  INDICATIONS:  Acute MI; inferolateral/posterior MI  Emergency consent  PROCEDURE TECHNIQUE:  After Xylocaine anesthesia a 35F sheath was placed in the right femoral artery with a single anterior needle wall stick.   Left coronary angiography was done using a Judkins L4 guide catheter.  Right coronary angiography was done using a Judkins R4 guide catheter.  SVG/LIMA angiography was done using a JR4 catheter. Left heart cath was done using a pigtail catheter.    CONTRAST:  Total of 140 cc.  COMPLICATIONS:  None.    HEMODYNAMICS:  Aortic pressure was 196/93; LV pressure was 195/39; LVEDP 42.  There was no gradient between the left ventricle and aorta.  Of note, these pressure measurements were obtained after his episode of complete heart block and after he received CPR.  The readings may have been somewhat affected by zeroing of the pressure line.  ANGIOGRAPHIC DATA:   The left main coronary artery is widely patent..  The left anterior descending artery is patent proximally.  The mid-vessel is heavily diseased and occluded in the midportion.  There is a small diagonal vessel which is patent.  There is a large septal arcade that appears to feed collaterals to the distal right coronary artery circulation.  The left circumflex artery is is a medium-sized vessel.  There is an ostial 25% stenosis.  The mid circumflex is heavily diseased and essentially subtotally occluded.  The OM1 is small.  The right coronary artery is a small vessel but appears dominant.  It is heavily diseased throughout the proximal, mid and distal portions.  SVG to RCA was occluded proximally. SVG to circumflex system was occluded proximally. SVG to diagonal was small and had moderate disease in the mid torsion.  There is a severe, 95% stenosis in the  distal portion of the graft.  There is TIMI-3 flow at the time of the diagnostic angiogram. The LIMA to LAD is widely patent.  The mid to distal LAD filled by the LIMA is small and diffusely diseased.    PCI NARRATIVE: Angiomax was used for anticoagulation.  ACT was used to confirm that was therapeutic.  It was unclear which occluded graft was the cause of the patient's acute MI.  He had ECG changes in the inferior, lateral and posterior distributions.  His pain had been going on for over 24 hours.  It was not continuous without.  His longest stretch of pain was about 15 hours.  Initial troponin was elevated at over 13.   An LCB guide was used to engage the SVG to circumflex.  Several pro-water wire support attempted to cross this lesion without success.  A fielder XT was also tried without success.  A JR 4 guide was then used to engage the SVG to RCA.  A pro-water wire would not cross this lesion.  The LCB guide was then placed back into the SVG to diagonal that had a 95% stenosis.  Proler wire was used to cross the lesion.  A 2.0 x 12 many track balloon was used to predilate the lesion in the distal graft.  A 2.25 x 16 Promus elements stent was then placed across the heavily diseased area in the distal graft and deployed at 14 atmospheres for 33 seconds.  There is an excellent angiographic result.  TIMI-3 flow was maintained.  I do not want to risk distal embolization and  therefore did not post dilate the stent as it did appear oversized for the small graft.  The patient's pain improved significantly.  There was some distal spasm likely from a wire.  Intracoronary nitroglycerin was administered with some resolution of this vessel spasm.  There is no residual stenosis.  Lesion length was 12 mm.  LEFT VENTRICULOGRAM:  Left ventricular angiogram was not done due to elevated LVEDP.  LVEDP was 46 mm Hg.  as we advanced the pigtail catheter into the left ventricle, the patient had an episode of complete  heart block.  He did lose consciousness.  He required CPR for about 15 seconds.  His rhythm spontaneously came back.  We placed a temporary pacemaker at that point through a 6 French right femoral venous sheath.  IMPRESSIONS:  1. Severe three-vessel native coronary artery disease.  Patent LIMA to LAD.  Chronically occluded SVG to RCA.  Chronically occluded SVG to circumflex. 2.   95% stenosis in the distal SVG to diagonal graft.  This was successfully stented with a 2.25 x 16 drug-eluting stent. 3.   transient complete heart block requiring CPR for about 20 seconds after revascularization.  Temporary transvenous pacemaker placed after this episode.  RECOMMENDATION:  The patient will be watched in the CCU.  Continue Angiomax for 2 hours at the lower dose since he was not able to swallow Plavix while on the table.  He has been given a 600 mg of Plavix.  He'll need to continue dual antiplatelet therapy for at least a year.  Continue aggressive secondary prevention.  Will hold off on beta blocker now until the patient's rhythm stabilizes.  He will need statin.  Continue ARB.

## 2011-09-07 NOTE — ED Notes (Signed)
Assumed care of pt from [facility].  Pt given 4mg  morphine enroute for 6/10 CP.  CP now a 3/10. Heparin 4000 unit bolus, 12.5 units/hr.  No nitro given.  170/116, 98% 2L.  (L) side CP radiating to back.  CVA at 2 months old, (L) sided weakness.  CABG.  Troponin 13.74.

## 2011-09-07 NOTE — ED Notes (Signed)
MD at bedside. 

## 2011-09-07 NOTE — Progress Notes (Signed)
At [phone] pt complained of chest pain level 4 out of 10. Pt already had oxygen on via nasal cannula. Rn increased nitro gtt total of 3 times,ending at over period of 20 minutes. ekg obtained showing no changes. Pt's pain was still level 3 after increasing to , administered 0.25 mg xanax and pt then had period of belching/dry heaving which actually alleviated some of his pain and is now level 2/10.blood pressures remained above 100 systolic.

## 2011-09-07 NOTE — Consult Note (Signed)
WOC consult Note Reason for Consult: eval for use of silver antimicrobial wicking textile for intertriginous candida.  Pt has areas under pannus, clinically consistent candida.    Wound type: MASD (moisture associated skin damage) with associated candida infection of the intertriginous skin folds of the pannus   Wound bed: 2 areas noted with intense erythema and associated satellite lesions  Drainage (amount, consistency, odor) none, no odor  Periwound:intact, some ecchymosis of the pannus prob. Related to recent procedures and SQ injections  Dressing procedure/placement/frequency: will order InterDry Ag+ for this area.  Reviewed application with nursing at the bedside and explained tx to patient as well.    Thanks  Courtenay Creger [employer], CWOCN [phone])

## 2011-09-08 DIAGNOSIS — I219 Acute myocardial infarction, unspecified: Secondary | ICD-10-CM

## 2011-09-08 LAB — COMPREHENSIVE METABOLIC PANEL
AST: 234 U/L — ABNORMAL HIGH (ref 0–37)
Albumin: 3.1 g/dL — ABNORMAL LOW (ref 3.5–5.2)
Calcium: 9.1 mg/dL (ref 8.4–10.5)
Creatinine, Ser: 1.14 mg/dL (ref 0.50–1.35)
Total Protein: 7.2 g/dL (ref 6.0–8.3)

## 2011-09-08 LAB — CBC
HCT: 42.1 % (ref 39.0–52.0)
MCV: 86.1 fL (ref 78.0–100.0)
Platelets: 175 10*3/uL (ref 150–400)
RBC: 4.89 MIL/uL (ref 4.22–5.81)
WBC: 17.1 10*3/uL — ABNORMAL HIGH (ref 4.0–10.5)

## 2011-09-08 LAB — LIPASE, BLOOD: Lipase: 18 U/L (ref 11–59)

## 2011-09-08 LAB — GLUCOSE, CAPILLARY: Glucose-Capillary: 271 mg/dL — ABNORMAL HIGH (ref 70–99)

## 2011-09-08 LAB — DIFFERENTIAL
Basophils Relative: 0 % (ref 0–1)
Eosinophils Absolute: 0 10*3/uL (ref 0.0–0.7)
Neutrophils Relative %: 85 % — ABNORMAL HIGH (ref 43–77)

## 2011-09-08 LAB — AMYLASE: Amylase: 32 U/L (ref 0–105)

## 2011-09-08 MED ORDER — FUROSEMIDE 10 MG/ML IJ SOLN
40.0000 mg | Freq: Three times a day (TID) | INTRAMUSCULAR | Status: DC
  Administered 2011-09-08 – 2011-09-09 (×4): 40 mg via INTRAVENOUS
  Filled 2011-09-08 (×7): qty 4

## 2011-09-08 MED ORDER — SPIRONOLACTONE 12.5 MG HALF TABLET
12.5000 mg | ORAL_TABLET | Freq: Every day | ORAL | Status: DC
  Administered 2011-09-08 – 2011-09-15 (×8): 12.5 mg via ORAL
  Filled 2011-09-08 (×8): qty 1

## 2011-09-08 MED ORDER — ASPIRIN 81 MG PO CHEW
81.0000 mg | CHEWABLE_TABLET | Freq: Every day | ORAL | Status: DC
  Administered 2011-09-08 – 2011-09-15 (×8): 81 mg via ORAL
  Filled 2011-09-08 (×7): qty 1

## 2011-09-08 MED ORDER — CARVEDILOL 3.125 MG PO TABS
3.1250 mg | ORAL_TABLET | Freq: Two times a day (BID) | ORAL | Status: DC
  Administered 2011-09-08 – 2011-09-12 (×9): 3.125 mg via ORAL
  Filled 2011-09-08 (×11): qty 1

## 2011-09-08 MED ORDER — GI COCKTAIL ~~LOC~~
30.0000 mL | Freq: Three times a day (TID) | ORAL | Status: DC | PRN
  Administered 2011-09-08: 30 mL via ORAL
  Filled 2011-09-08 (×2): qty 30

## 2011-09-08 MED FILL — Dextrose Inj 5%: INTRAVENOUS | Qty: 50 | Status: AC

## 2011-09-08 NOTE — Progress Notes (Signed)
Inpatient Diabetes Program Recommendations  AACE/ADA: New Consensus Statement on Inpatient Glycemic Control (2009)  Target Ranges:  Prepandial:   less than 140 mg/dL      Peak postprandial:   less than 180 mg/dL (1-2 hours)      Critically ill patients:  140 - 180 mg/dL   Reason:  CBGs >782   Inpatient Diabetes Program Recommendations Correction (SSI): Start Novolog moderate scale TID + HS scale May also need HS dose of NPH. Start with 10 units.

## 2011-09-08 NOTE — Evaluation (Signed)
Mark French,PT Acute Rehabilitation [phone] (pager)  

## 2011-09-08 NOTE — Clinical Documentation Improvement (Signed)
GENERIC DOCUMENTATION CLARIFICATION QUERY  THIS DOCUMENT IS NOT A PERMANENT PART OF THE MEDICAL RECORD  TO RESPOND TO THE THIS QUERY, FOLLOW THE INSTRUCTIONS BELOW:  1. If needed, update documentation for the patient's encounter via the notes activity.  2. Access this query again and click edit on the In [employer].  3. After updating, or not, click F2 to complete all highlighted (required) fields concerning your review. Select "additional documentation in the medical record" OR "no additional documentation provided".  4. Click Sign note button.  5. The deficiency will fall out of your In Basket *Please let us know if you are not able to complete this workflow by phone or e-mail (listed below).  Please update your documentation within the medical record to reflect your response to this query.                                                                                        09/08/11   Dear Dr. Eldridge Dace / Associates,  In a better effort to capture your patient's severity of illness, reflect appropriate length of stay and utilization of resources, a review of the patient medical record has revealed the following indicators. Based on your clinical judgment, please clarify and document in a progress note and/or discharge summary the clinical condition associated with the following supporting information:In responding to this query please exercise your independent judgment.  The fact that a query is asked, does not imply that any particular answer is desired or expected.  Possible Clinical Conditions?  Per cath report: "He did lose consciousness. He required CPR for about 15 seconds".  _______Cardiac arrest  _______Other Condition__________________ _______Cannot Clinically Determine   Supporting Information:  Risk Factors:inferolateral STEMI  Diagnostics:monitor  Treatment: CPR; Temporary transvenous pacemaker  You may use possible, probable, or suspect with inpatient  documentation. possible, probable, suspected diagnoses MUST be documented at the time of discharge  Reviewed:  no additional documentation provided  Thank You,  Amada Kingfisher RN, BSN, CCM  Clinical Documentation Specialist: [phone] [email or web address]  Health Information Management [place]

## 2011-09-08 NOTE — Progress Notes (Signed)
UR Completed. Simmons, Brandye Inthavong F [phone]  

## 2011-09-08 NOTE — Progress Notes (Signed)
Patient ID: Mark French, male   DOB: [birth year], 66 y.o.   MRN: [id number]    SUBJECTIVE: No chest pain.  Has not been out of bed.  Nausea this morning with emesis.  Looks short of breath.      [name] aspirin  81 mg Oral Daily  . carvedilol  3.125 mg Oral BID WC  . clopidogrel  75 mg Oral Q breakfast  . clotrimazole   Topical BID  . furosemide  40 mg Intravenous Q8H  . gabapentin  300 mg Oral BID  . insulin NPH  30 Units Subcutaneous BH-q7a  . losartan  50 mg Oral Daily  . ondansetron  4 mg Intravenous Once  . pantoprazole  40 mg Oral Q1200  . pneumococcal 23 valent vaccine  0.5 mL Intramuscular Tomorrow-1000  . rosuvastatin  40 mg Oral q1800  . sodium chloride  3 mL Intravenous Q12H  . spironolactone  12.5 mg Oral Daily  . DISCONTD: aspirin  325 mg Oral Daily  . DISCONTD: furosemide  40 mg Intravenous BID  NTG gtt at 20   Filed Vitals:   09/08/11 0300 09/08/11 0400 09/08/11 0500 09/08/11 0600  BP: 117/43 114/62 129/51 112/54  Pulse: 76 75 82 81  Temp:  99.1 F (37.3 C)    TempSrc:  Oral    Resp:      Height:      Weight:      SpO2: 98% 98% 95% 87%    Intake/Output Summary (Last 24 hours) at 09/08/11 0741 Last data filed at 09/08/11 0600  Gross per 24 hour  Intake 621.78 ml  Output   1554 ml  Net -932.22 ml    LABS: Basic Metabolic Panel:  Basename 09/08/11 0240 09/07/11 0940  NA 133* 133*  K 4.1 4.8  CL 92* 99  CO2 28 25  GLUCOSE 258* 176*  BUN 21 14  CREATININE 1.14 0.91  CALCIUM 9.1 8.5  MG -- --  PHOS -- --   Liver Function Tests:  Basename 09/08/11 0240 09/07/11 0255  AST 234* 121*  ALT 59* 34  ALKPHOS 89 86  BILITOT 0.6 0.3  PROT 7.2 7.6  ALBUMIN 3.1* 3.7    Basename 09/08/11 0240  LIPASE 18  AMYLASE 32   CBC:  Basename 09/08/11 0400 09/08/11 0240 09/07/11 0306 09/07/11 0255  WBC 17.1* -- -- 10.2  NEUTROABS -- 14.9* -- --  HGB 13.6 -- 15.0 --  HCT 42.1 -- 44.0 --  MCV 86.1 -- -- 86.0  PLT 175 -- -- 189   Cardiac  Enzymes:  Basename 09/07/11 0940  CKTOTAL 2428*  CKMB 178.5*  CKMBINDEX --  TROPONINI >25.00*    PHYSICAL EXAM General: NAD Neck: JVP 12 cm, no thyromegaly or thyroid nodule.  Lungs: Crackles at bases bilaterally.  CV: Nondisplaced PMI.  Heart regular S1/S2, no S3/S4, no murmur.  1+ ankle edema.   Abdomen: Soft, nontender, no hepatosplenomegaly, no distention.  Neurologic: Alert and oriented x 3.  Psych: Normal affect. Extremities: Venous stasis changes lower legs.   TELEMETRY: Reviewed telemetry pt in NSR  ASSESSMENT AND PLAN: 66 yo with CAD s/p CABG, PAD, and baseline disability using motorized scooter most of the time presented with inferior STEMI.  CHB during procedure when catheter placed in ventricle.  No arrhythmia since then.  1. CAD: Inferior STEMI.  Patient had occluded SVG-RCA and SVG-OM on cath.  Stenosed SVG-diagonal was treated with DES but suspect this was not the culprit.  Patient appears to  have completed MI with very high cardiac enzymes.  Suspect elevated AST/ALT is related to MI. He is no longer having chest pain.  - ASA 81, Plavix, statin (will continue for now, follow LFTs to make sure trend down), ARB - Add low dose Coreg as he has had no further heart block.  2. CHF: Acute on chronic systolic.  Difficult echo yesterday -- EF at least as low as 45% with inferoposterolateral hypokinesis.  He is volume overloaded on exam with pulmonary edema on CXR.   - Would like to see more diuresis: Increase Lasix to q 8 hrs.  - Continue losartan and Coreg, likely increase losartan tomorrow.  - spironolactone 12.5 mg daily. - Can continue NTG gtt for now to help with CHF.  3. Needs to mobilize today, will involve PT.  At baseline uses motorized scooter most of the time.   Marca Ancona 09/08/2011 7:49 AM

## 2011-09-08 NOTE — Evaluation (Signed)
Physical Therapy Evaluation Patient Details Name: SUYASH AMORY MRN: [id number] DOB: Jun 07, 1946 Today's Date: 09/08/2011  Problem List:  Patient Active Problem List  Diagnoses  . DIABETES MELLITUS, TYPE I  . HYPOGONADISM  . HYPERCHOLESTEROLEMIA  . ANEMIA-IRON DEFICIENCY  . DEPRESSION  . PERIPHERAL NEUROPATHY  . PROLIFERATIVE DIABETIC RETINOPATHY  . BLINDNESS  . HYPERTENSION  . MYOCARDIAL INFARCTION, HX OF  . CORONARY ARTERY DISEASE  . CONGESTIVE HEART FAILURE  . ALLERGIC RHINITIS  . RENAL INSUFFICIENCY  . ULCER, LEG  . ARTHROPATHY ASSOCIATED W/NEUROLOGICAL DISORDERS  . CERVICAL RADICULOPATHY, LEFT  . MYALGIA  . EDEMA  . CEREBROVASCULAR ACCIDENT, HX OF  . HYPERKALEMIA  . PERIPHERAL VASCULAR INSUFFICIENCY,  LEGS, BILATERAL  . LEG PAIN  . DYSPNEA  . Hypocalcemia  . Altered mental status  . Hypothermia    Past Medical History:  Past Medical History  Diagnosis Date  . CORONARY ARTERY DISEASE 03/11/2007  . DIABETES MELLITUS, TYPE I 03/11/2007  . HYPERTENSION 03/11/2007  . BLINDNESS 05/21/2008  . Proliferative diabetic retinopathy 03/09/2009  . HYPERCHOLESTEROLEMIA 09/28/2008  . CERVICAL RADICULOPATHY, LEFT 09/23/2008  . ANEMIA-IRON DEFICIENCY 09/23/2008  . DEPRESSION 09/23/2008  . PERIPHERAL NEUROPATHY 09/23/2008  . MYOCARDIAL INFARCTION, HX OF 09/23/2008  . CONGESTIVE HEART FAILURE 09/23/2008  . RENAL INSUFFICIENCY 03/15/2010  . CEREBROVASCULAR ACCIDENT, HX OF 09/23/2008  . 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 Venous insufficiency of leg   . ED (erectile dysfunction)   . Charcot foot due to diabetes mellitus     chronic pain  . Hypogonadism male   . Congestive heart failure   . Duodenal ulcer 2012    multiple  . GI bleed   . Osteoarthritis   . Stroke   . Blood transfusion    Past Surgical History:  Past Surgical History  Procedure Date  . Coronary artery bypass graft   . Carpal tunnel release     left  . Wrist surgery     s/p right-bone  graft fusion  . Spine surgery 2010    C spine surgery for disc Dr. Yetta Barre  . Sigmoidoscopy 03/11/2001  . Venous doppler 01/30/2004  . Cataract surgery   . Ankle surgery     left  . Foot surgery     left    PT Assessment/Plan/Recommendation PT Assessment Clinical Impression Statement: Pt was admitted with chest pain. Pt presented today needing more O2 then previous day. Evaluation was limited due to SPO2 status. He was on nonrebreather mask with 4 L O2 when PT entered room secondary to drop in SPO2 down to 83%. Able to assess pts bed mobility and sitting balance but was unable to assess transfers/ambulation. Was also limited in pts history. Pt did report that he was using a quad cane most of the time to get around and when going out into the community he used a powered WC. Pt would benefit from PT services to help increase his endurance, strenth, balance and transfer ability. Pt lives at home with wife but does not have someone who can stay with him 24/7. PT reccommends SNF for follow up therapy at D/C, pt agrees.  PT Recommendation/Assessment: Patient will need skilled PT in the acute care venue PT Problem List: Decreased strength;Decreased range of motion;Decreased activity tolerance;Decreased balance;Decreased mobility;Decreased coordination;Decreased knowledge of use of DME;Decreased safety awareness;Decreased knowledge of precautions Barriers to Discharge: Decreased caregiver support PT Therapy Diagnosis : Difficulty walking;Abnormality of gait;Generalized weakness PT Plan PT Frequency: Min 3X/week PT Treatment/Interventions: DME instruction;Gait  training;Functional mobility training;Therapeutic activities;Therapeutic exercise;Balance training;Patient/family education;Wheelchair mobility training PT Recommendation Follow Up Recommendations: Skilled nursing facility Equipment Recommended: Defer to next venue PT Goals  Acute Rehab PT Goals PT Goal Formulation: With patient Time For Goal  Achievement: 2 weeks Pt will go Supine/Side to Sit: with modified independence PT Goal: Supine/Side to Sit - Progress: Goal set today Pt will go Sit to Supine/Side: with modified independence PT Goal: Sit to Supine/Side - Progress: Goal set today Pt will go Sit to Stand: with supervision;with upper extremity assist PT Goal: Sit to Stand - Progress: Goal set today Pt will go Stand to Sit: with supervision;with upper extremity assist PT Goal: Stand to Sit - Progress: Goal set today Pt will Transfer Bed to Chair/Chair to Bed: with min assist PT Transfer Goal: Bed to Chair/Chair to Bed - Progress: Goal set today Pt will Ambulate: 16 - 50 feet;with min assist;with least restrictive assistive device PT Goal: Ambulate - Progress: Goal set today Pt will Perform Home Exercise Program: Independently PT Goal: Perform Home Exercise Program - Progress: Goal set today  PT Evaluation Precautions/Restrictions  Precautions Precautions: Fall Prior Functioning  Home Living Lives With: Spouse Receives Help From: Family;Friend(s) Type of Home: House Home Layout: One level Home Access: Level entry Bathroom Shower/Tub: Engineer, manufacturing systems: Standard Bathroom Accessibility: Yes How Accessible: Accessible via walker Home Adaptive Equipment: Shower chair without back;Walker - rolling;Quad cane;Wheelchair - manual (limited today with answers, need to reassess next visit) Prior Function Level of Independence: Independent with basic ADLs;Independent with transfers;Needs assistance with homemaking;Needs assistance with gait Cognition Cognition Arousal/Alertness: Awake/alert Overall Cognitive Status: Appears within functional limits for tasks assessed Orientation Level: Oriented X4 Sensation/Coordination Sensation Light Touch: Appears Intact Stereognosis: Not tested Hot/Cold: Not tested Proprioception: Not tested Coordination Gross Motor Movements are Fluid and Coordinated: Yes Fine Motor  Movements are Fluid and Coordinated: Yes Extremity Assessment RUE Assessment RUE Assessment: Within Functional Limits LUE Assessment LUE Assessment: Within Functional Limits RLE Assessment RLE Assessment: Within Functional Limits RLE AROM (degrees) Overall AROM Right Lower Extremity: Within functional limits for tasks assessed RLE Overall AROM Comments: R knee extension was limited as well as plantar flexion LLE Assessment LLE Assessment: Within Functional Limits LLE AROM (degrees) Overall AROM Left Lower Extremity: Within functional limits for tasks assessed LLE Overall AROM Comments: limited knee extension as well as plantarflexion; similar to R LE Mobility (including Balance) Bed Mobility Bed Mobility: Yes Supine to Sit: 3: Mod assist Supine to Sit Details (indicate cue type and reason): pt able to move legs to edge of bed independently, but needs assistance getting COM over BOS; needing cueing for progression of exercise Sit to Supine: 4: Min assist Sit to Supine - Details (indicate cue type and reason): pt needing cueing for progression of exercise and to get legs back into middle of bed Transfers Transfers: No Ambulation/Gait Ambulation/Gait: No Stairs: No Wheelchair Mobility Wheelchair Mobility: No  Posture/Postural Control Posture/Postural Control: No significant limitations Balance Balance Assessed: Yes Static Sitting Balance Static Sitting - Balance Support: No upper extremity supported;Feet supported Static Sitting - Level of Assistance: 4: Min assist Static Sitting - Comment/# of Minutes: pt able to sit at side of bed for 10 minutes while PT evaled and nurses gave medicine. He tended to lean posteriorly and needed cueing on sitting straight up.  Dynamic Sitting Balance Dynamic Sitting - Balance Support: No upper extremity supported;Feet supported Dynamic Sitting - Level of Assistance: 4: Min assist Dynamic Sitting - Comments: pt able to perform  ROM/strength tests  with minimal LOB, needed assistance getting COM over BOS when started to lean posteriorly in sitting  End of Session PT - End of Session Activity Tolerance: Patient limited by fatigue Patient left: in bed;with call bell in reach General Behavior During Session: [facility] for tasks performed Cognition: [facility] for tasks performed  Elvera Bicker 09/08/2011, 1:24 PM

## 2011-09-09 DIAGNOSIS — R5381 Other malaise: Secondary | ICD-10-CM

## 2011-09-09 DIAGNOSIS — I509 Heart failure, unspecified: Secondary | ICD-10-CM

## 2011-09-09 LAB — COMPREHENSIVE METABOLIC PANEL
ALT: 48 U/L (ref 0–53)
Alkaline Phosphatase: 76 U/L (ref 39–117)
CO2: 33 mEq/L — ABNORMAL HIGH (ref 19–32)
Calcium: 9.1 mg/dL (ref 8.4–10.5)
GFR calc Af Amer: 60 mL/min — ABNORMAL LOW (ref >90)
GFR calc non Af Amer: 51 mL/min — ABNORMAL LOW (ref >90)
Glucose, Bld: 174 mg/dL — ABNORMAL HIGH (ref 70–99)
Sodium: 132 mEq/L — ABNORMAL LOW (ref 135–145)
Total Bilirubin: 0.5 mg/dL (ref 0.3–1.2)

## 2011-09-09 LAB — GLUCOSE, CAPILLARY: Glucose-Capillary: 79 mg/dL (ref 70–99)

## 2011-09-09 LAB — CBC
Hemoglobin: 12.3 g/dL — ABNORMAL LOW (ref 13.0–17.0)
MCH: 27.8 pg (ref 26.0–34.0)
MCV: 86.2 fL (ref 78.0–100.0)
RBC: 4.42 MIL/uL (ref 4.22–5.81)

## 2011-09-09 MED ORDER — INSULIN NPH (HUMAN) (ISOPHANE) 100 UNIT/ML ~~LOC~~ SUSP: 15.0000 [IU] | Freq: Every day | SUBCUTANEOUS | Status: DC

## 2011-09-09 MED ORDER — FUROSEMIDE 40 MG PO TABS
40.0000 mg | ORAL_TABLET | Freq: Every day | ORAL | Status: DC
  Administered 2011-09-09 – 2011-09-15 (×7): 40 mg via ORAL
  Filled 2011-09-09 (×7): qty 1

## 2011-09-09 MED ORDER — INSULIN ASPART 100 UNIT/ML ~~LOC~~ SOLN
4.0000 [IU] | Freq: Three times a day (TID) | SUBCUTANEOUS | Status: DC
  Administered 2011-09-09 (×2): 4 [IU] via SUBCUTANEOUS
  Filled 2011-09-09 (×2): qty 3

## 2011-09-09 MED ORDER — INSULIN ASPART 100 UNIT/ML ~~LOC~~ SOLN: 15.0000 [IU] | Freq: Three times a day (TID) | SUBCUTANEOUS | Status: DC

## 2011-09-09 MED ORDER — INSULIN NPH (HUMAN) (ISOPHANE) 100 UNIT/ML ~~LOC~~ SUSP
15.0000 [IU] | Freq: Every day | SUBCUTANEOUS | Status: DC
  Administered 2011-09-10 – 2011-09-13 (×3): 15 [IU] via SUBCUTANEOUS
  Filled 2011-09-09: qty 10

## 2011-09-09 MED ORDER — INSULIN REGULAR HUMAN 100 UNIT/ML IJ SOLN: 15.0000 [IU] | Freq: Every evening | INTRAMUSCULAR | Status: DC

## 2011-09-09 MED ORDER — INSULIN NPH (HUMAN) (ISOPHANE) 100 UNIT/ML ~~LOC~~ SUSP
25.0000 [IU] | Freq: Every day | SUBCUTANEOUS | Status: DC
  Administered 2011-09-11 – 2011-09-15 (×4): 25 [IU] via SUBCUTANEOUS

## 2011-09-09 NOTE — Progress Notes (Signed)
Subjective:  66 y/o male with DM2 and CAD s/p CABG 98. Admitted with inferior STEMI with 2 occluded SVGs. (SVG to OM and SVG to RCA) Underwent PCI of SVG to Diag (suspect non-culprit lesion). EF 45%. Hospital course c/b by HF.   Patient states that he is sleepy; otherwise, denies any SOB or chest pain.  SBP dropped to 88 overnight. On lasix 40 IV q8. Weight down 6 pounds since admit although I/O essentially even. (?accurate)   Objective: Vital signs in last 24 hours: Filed Vitals:   09/09/11 0400 09/09/11 0500 09/09/11 0600 09/09/11 0700  BP: 122/64 88/36 114/61 131/63  Pulse: 74 65 70 74  Temp: 97.8 F (36.6 C)     TempSrc: Oral     Resp:      Height:      Weight:   223 lb 15.8 oz (101.6 kg)   SpO2: 97% 99% 96% 99%   Weight change:   Intake/Output Summary (Last 24 hours) at 09/09/11 0718 Last data filed at 09/09/11 0700  Gross per 24 hour  Intake   1276 ml  Output   1125 ml  Net    151 ml   Physical Exam: General: NAD.  Neck: JVP flat, no thyromegaly or thyroid nodule.  Lungs: Crackles at bases bilaterally.  CV: Nondisplaced PMI. Heart regular S1/S2, no S3/S4, no murmur.   Abdomen: Soft, nontender, no hepatosplenomegaly, no distention.  Neurologic: Alert and oriented x 3.  Psych: Normal affect.  Extremities: Venous stasis changes lower legs. No edema noted  Lab Results: Basic Metabolic Panel:  Lab 09/08/11 1610 09/07/11 0940  NA 133* 133*  K 4.1 4.8  CL 92* 99  CO2 28 25  GLUCOSE 258* 176*  BUN 21 14  CREATININE 1.14 0.91  CALCIUM 9.1 8.5  MG -- --  PHOS -- --   Liver Function Tests:  Lab 09/08/11 0240 09/07/11 0255  AST 234* 121*  ALT 59* 34  ALKPHOS 89 86  BILITOT 0.6 0.3  PROT 7.2 7.6  ALBUMIN 3.1* 3.7    Lab 09/08/11 0240  LIPASE 18  AMYLASE 32   No results found for this basename: AMMONIA:2 in the last 168 hours CBC:  Lab 09/09/11 0600 09/08/11 0400 09/08/11 0240  WBC 14.7* 17.1* --  NEUTROABS -- -- 14.9*  HGB 12.3* 13.6 --  HCT  38.1* 42.1 --  MCV 86.2 86.1 --  PLT 185 175 --   Cardiac Enzymes:  Lab 09/07/11 0940  CKTOTAL 2428*  CKMB 178.5*  CKMBINDEX --  TROPONINI >25.00*   BNP:  Lab 09/07/11 0940  PROBNP 1365.0*   D-Dimer: No results found for this basename: DDIMER:2 in the last 168 hours CBG:  Lab 09/08/11 2205 09/08/11 1733 09/08/11 1132 09/08/11 0808 09/07/11 2115 09/07/11 1643  GLUCAP 215* 303* 271* 234* 225* 186*   Coagulation:  Lab 09/07/11 0255  LABPROT 12.9  INR 0.95   Urine Drug Screen: Drugs of Abuse     Component Value Date/Time   LABOPIA POSITIVE* 08/22/2011 1539   COCAINSCRNUR NONE DETECTED 08/22/2011 1539   LABBENZ NONE DETECTED 08/22/2011 1539   AMPHETMU NONE DETECTED 08/22/2011 1539   THCU NONE DETECTED 08/22/2011 1539   LABBARB NONE DETECTED 08/22/2011 1539   Micro Results: Recent Results (from the past 240 hour(s))  MRSA PCR SCREENING     Status: Normal   Collection Time   09/07/11  6:37 AM      Component Value Range Status Comment   MRSA by PCR NEGATIVE  NEGATIVE  Final    SMedications: reviewed Scheduled Meds:   . aspirin  81 mg Oral Daily  . carvedilol  3.125 mg Oral BID WC  . clopidogrel  75 mg Oral Q breakfast  . clotrimazole   Topical BID  . furosemide  40 mg Intravenous Q8H  . gabapentin  300 mg Oral BID  . insulin NPH  30 Units Subcutaneous BH-q7a  . losartan  50 mg Oral Daily  . ondansetron  4 mg Intravenous Once  . pantoprazole  40 mg Oral Q1200  . pneumococcal 23 valent vaccine  0.5 mL Intramuscular Tomorrow-1000  . rosuvastatin  40 mg Oral q1800  . sodium chloride  3 mL Intravenous Q12H  . spironolactone  12.5 mg Oral Daily  . DISCONTD: aspirin  325 mg Oral Daily  . DISCONTD: furosemide  40 mg Intravenous BID   Continuous Infusions:   . sodium chloride 250 mL (09/08/11 0600)  . nitroGLYCERIN 20 mcg/min (09/08/11 2212)   PRN Meds:.acetaminophen, acetaminophen, ALPRAZolam, docusate sodium, gi cocktail, HYDROcodone-acetaminophen, morphine,  ondansetron (ZOFRAN) IV, ondansetron (ZOFRAN) IV, promethazine, sodium chloride  Assessment/Plan: 66 yo with CAD s/p CABG, PAD, and baseline disability using motorized scooter most of the time presented with inferior STEMI. CHB during procedure when catheter placed in ventricle. No arrhythmia since then.   1. CAD: Inferior STEMI. Patient had occluded SVG-RCA and SVG-OM on cath. Stenosed SVG-diagonal was treated with DES but suspect this was not the culprit.  - ASA 81, Plavix, statin (will continue for now, follow LFTs to make sure trend down), ARB  - Continue Coreg as he has had no further heart block.  - transfer tele  2. CHF: Acute  systolic.  EF at least as low as 45% with inferoposterolateral hypokinesis. I/O shows net positive of 151 cc yesterday but down 6 pounds since 1/31.  Volume status much improved - Continue losartan and Coreg. Will not increase Losartan today as his BP still on soft side - spironolactone 12.5 mg daily.  -change lasix to po  3. Deconditioning -uses electric cart and 4-pronged can at home -PT recommended SNF - consult case manager  4. DM2 - CBGs high. Will increase NPH and add meal coverage   LOS: 2 days   HO,MICHELE 09/09/2011, 7:18 AM  Patient seen and examined with Dr. Anselm Jungling. We discussed all aspects of the encounter. I agree with the assessment and plan as stated above.  He is improving. No further angina. Volume status much improved. Will consolidate meds. Adjust DM2 regimen. Transfer to tele. PT recommends SNF. Will get case manager involved. Possible d/c on Monday if bed available.  Lilburn Straw,MD 8:30 AM

## 2011-09-10 LAB — GLUCOSE, CAPILLARY
Glucose-Capillary: 101 mg/dL — ABNORMAL HIGH (ref 70–99)
Glucose-Capillary: 156 mg/dL — ABNORMAL HIGH (ref 70–99)
Glucose-Capillary: 170 mg/dL — ABNORMAL HIGH (ref 70–99)

## 2011-09-10 LAB — BASIC METABOLIC PANEL
CO2: 32 mEq/L (ref 19–32)
Chloride: 89 mEq/L — ABNORMAL LOW (ref 96–112)
Potassium: 3.5 mEq/L (ref 3.5–5.1)
Sodium: 129 mEq/L — ABNORMAL LOW (ref 135–145)

## 2011-09-10 MED ORDER — INSULIN ASPART 100 UNIT/ML ~~LOC~~ SOLN
15.0000 [IU] | Freq: Three times a day (TID) | SUBCUTANEOUS | Status: DC
  Administered 2011-09-12 – 2011-09-15 (×6): 15 [IU] via SUBCUTANEOUS

## 2011-09-10 MED ORDER — INSULIN ASPART 100 UNIT/ML ~~LOC~~ SOLN
0.0000 [IU] | Freq: Three times a day (TID) | SUBCUTANEOUS | Status: DC
  Administered 2011-09-10: 4 [IU] via SUBCUTANEOUS
  Administered 2011-09-11: 3 [IU] via SUBCUTANEOUS
  Administered 2011-09-11: 4 [IU] via SUBCUTANEOUS
  Administered 2011-09-11: 3 [IU] via SUBCUTANEOUS
  Administered 2011-09-13 – 2011-09-14 (×3): 4 [IU] via SUBCUTANEOUS
  Administered 2011-09-14: 3 [IU] via SUBCUTANEOUS
  Administered 2011-09-14: 4 [IU] via SUBCUTANEOUS

## 2011-09-10 NOTE — Progress Notes (Signed)
Subjective:  C/o mild dyspnea last night.  Better today.  Occasional nausea. No chest pain.    Objective:  Vital Signs in the last 24 hours: BP 137/73  Pulse 72  Temp(Src) 98.7 F (37.1 C) (Oral)  Resp 20  Ht 5\' 8"  (1.727 m)  Wt 101.4 kg (223 lb 8.7 oz)  BMI 33.99 kg/m2  SpO2 96%  Physical Exam: Elderly WM poor historian, obese Lungs:  Clear to A&P Cardiac:  Regular rhythm, normal S1 and S2, no S3 Extremities:  No edema present  Intake/Output from previous day: 02/02 0701 - 02/03 0700 In: 860 [P.O.:860] Out: 1700 [Urine:1700] Weight change: -1.1 kg (-2 lb 6.8 oz)  Lab Results: Basic Metabolic Panel:  Basename 09/10/11 0608 09/09/11 0600  NA 129* 132*  K 3.5 3.6  CL 89* 90*  CO2 32 33*  GLUCOSE 101* 174*  BUN 33* 32*  CREATININE 1.08 1.38*   CBC:  Basename 09/09/11 0600 09/08/11 0400 09/08/11 0240  WBC 14.7* 17.1* --  NEUTROABS -- -- 14.9*  HGB 12.3* 13.6 --  HCT 38.1* 42.1 --  MCV 86.2 86.1 --  PLT 185 175 --   Protime: . Lab Results  Component Value Date   INR 0.95 09/07/2011   INR 1.07 08/22/2011   INR 0.9 10/28/2008    Telemetry: Reviewed -  Sinus with PAC's, no heart block.  Assessment/Plan:  1. CAD: Inferior STEMI. Patient had occluded SVG-RCA and SVG-OM on cath. Stenosed SVG-diagonal was treated with DES but suspect this was not the culprit.  - ASA 81, Plavix, statin (will continue for now, follow LFTs to make sure trend down), ARB  - Continue Coreg as he has had no further heart block.  2. CHF: Acute systolic. EF at least as low as 45% with inferoposterolateral hypokinesis. I/O shows net positive of 151 cc yesterday but down 6 pounds since 1/31. Volume status much improved  - Continue losartan and Coreg. Will not increase Losartan today as his BP still on soft side  - spironolactone 12.5 mg daily.  3. Deconditioning -uses electric cart and 4-pronged can at home  -PT recommended SNF  - consult case manager  4. DM2 - CBGs high. Nurse concerned  over multiple orders and other others written.       Darden Palmer.  MD [facility] 09/10/2011, 11:50 AM

## 2011-09-10 NOTE — Progress Notes (Signed)
   CARE MANAGEMENT NOTE 09/10/2011  Patient:  TYRAY, PROCH   Account Number:  [id number]  Date Initiated:  09/10/2011  Documentation initiated by:  [facility]  Subjective/Objective Assessment:   CAD, STEMI, HF     Action/Plan:   SNF recommended   Anticipated DC Date:  09/11/2011   Anticipated DC Plan:  SKILLED NURSING FACILITY  In-house referral  Clinical Social Worker      DC Planning Services  CM consult      Choice offered to / List presented to:             Status of service:  Completed, signed off Medicare Important Message given?   (If response is "NO", the following Medicare IM given date fields will be blank) Date Medicare IM given:   Date Additional Medicare IM given:    Discharge Disposition:  SKILLED NURSING FACILITY  Per UR Regulation:    Comments:  09/10/2011 1300 CSW working to d/c pt to SNF. No NCM needs identified. Isidoro Donning RN CCM Case Mgmt phone [phone]

## 2011-09-10 NOTE — Progress Notes (Signed)
CSW met with pt at bedside and spoke to pt's wife via phone RE: PT recommendation for SNF. Pt requesting [facility], but agreeable to [facility] See chart for full eval and FL2. Will f/u with offers.  Dellie Burns, MSW, [place] [phone] (weekend)

## 2011-09-11 LAB — GLUCOSE, CAPILLARY
Glucose-Capillary: 156 mg/dL — ABNORMAL HIGH (ref 70–99)
Glucose-Capillary: 137 mg/dL — ABNORMAL HIGH (ref 70–99)
Glucose-Capillary: 82 mg/dL (ref 70–99)

## 2011-09-11 LAB — BASIC METABOLIC PANEL
BUN: 28 mg/dL — ABNORMAL HIGH (ref 6–23)
GFR calc Af Amer: >90 mL/min (ref >90)
GFR calc non Af Amer: 87 mL/min — ABNORMAL LOW (ref >90)
Potassium: 3.7 mEq/L (ref 3.5–5.1)
Sodium: 128 mEq/L — ABNORMAL LOW (ref 135–145)

## 2011-09-11 NOTE — Plan of Care (Signed)
Problem: Phase II Progression Outcomes Goal: Vascular site scale level 0 - I Vascular Site Scale Level 0: No bruising/bleeding/hematoma Level I (Mild): Bruising/Ecchymosis, minimal bleeding/ooozing, palpable hematoma < 3 cm Level II (Moderate): Bleeding not affecting hemodynamic parameters, pseudoaneurysm, palpable hematoma > 3 cm  Outcome: Completed/Met Date Met:  09/11/11 Level 0

## 2011-09-11 NOTE — Progress Notes (Signed)
Been following pt as MI/stent. Noted inability to walk. Pt sts he got to chair yest and had severe back pain. N/A for ambulation. Discussed MI/stent/NTG with pt. Left booklet and diet handouts for wife. Pt seems in pain but sts he is as comfortable right now as he has been. Will sign off. [id number] Mark French CES, ACSM

## 2011-09-11 NOTE — Progress Notes (Signed)
Clinical Social Worker met with patient to discuss the bed offers received from the SNF's. The patient has bed offers, but requested to speak with wife about it. Patient's preference of [facility] did not make a bed offer. Patient appeared unhappy with the list of bed choices available at the moment. CSW will continue to follow.   Rozetta Nunnery MSW, [employer] [phone]

## 2011-09-11 NOTE — Consult Note (Signed)
WOC follow up [employer] started last Thursday to manage intertriginous skin damage from candida. Much improved today. Minimal redness noted in bilateral groin folds and no satellite lesion present on my assessment today.  Product needs to extend out 2cm past skin fold however will make sure nursing is aware of this.   Will follow  Teliah Buffalo Marlena Clipper, [place] [phone]

## 2011-09-11 NOTE — Progress Notes (Signed)
Subjective:  Complaining of back pain. No chest pain. Complains of dyspnea with activity.   Objective:  Vital Signs in the last 24 hours: Temp:  [97.9 F (36.6 C)-98.6 F (37 C)] 98.6 F (37 C) (02/04 0643) Pulse Rate:  [63-77] 77  (02/04 0643) Resp:  [19-20] 19  (02/04 0643) BP: (134-159)/(79-84) 159/84 mmHg (02/04 0643) SpO2:  [96 %] 96 % (02/04 0643) Weight:  [101.6 kg (223 lb 15.8 oz)] 101.6 kg (223 lb 15.8 oz) (02/04 0643)  Intake/Output from previous day: 02/03 0701 - 02/04 0700 In: 582 [P.O.:580; IV Piggyback:2] Out: 700 [Urine:700]  Physical Exam: Pt is alert and oriented, obese male in NAD HEENT: normal Neck: JVP - normal, carotids 2+= without bruits Lungs: CTA bilaterally CV: RRR, distant Abd: soft, NT, Positive BS, obese Ext: no C/C/E  Lab Results:  Basename 09/09/11 0600  WBC 14.7*  HGB 12.3*  PLT 185    Basename 09/11/11 0600 09/10/11 0608  NA 128* 129*  K 3.7 3.5  CL 87* 89*  CO2 31 32  GLUCOSE 129* 101*  BUN 28* 33*  CREATININE 0.87 1.08   No results found for this basename: TROPONINI:2,CK,MB:2 in the last 72 hours  Cardiac Studies: 2D Echo: poor acoustic windows. LVEF about 45%, inferoposterior akinesis  Assessment/Plan:  1. Inferior MI - occluded SVG to RCA. Medical therapy. Underwent PCI of diag graft 2. Diabetes - with complications. continue current program 3. CHF - acute systolic heart failure. 4. Marked deconditioning - motorized scooter at home.  Need to progress with physical therapy. Med management for CAD/heart failure. Pain control for back pain.   Tonny Bollman, M.D. 09/11/2011, 9:16 AM

## 2011-09-12 ENCOUNTER — Encounter (HOSPITAL_COMMUNITY): Payer: Self-pay

## 2011-09-12 ENCOUNTER — Inpatient Hospital Stay (HOSPITAL_COMMUNITY): Payer: Medicare Other

## 2011-09-12 LAB — CBC
HCT: 43.6 % (ref 39.0–52.0)
Hemoglobin: 14.9 g/dL (ref 13.0–17.0)
MCH: 28.9 pg (ref 26.0–34.0)
MCHC: 34.2 g/dL (ref 30.0–36.0)
MCV: 84.5 fL (ref 78.0–100.0)
Platelets: 263 10*3/uL (ref 150–400)
RBC: 5.16 MIL/uL (ref 4.22–5.81)
RDW: 13.5 % (ref 11.5–15.5)
WBC: 15.4 10*3/uL — ABNORMAL HIGH (ref 4.0–10.5)

## 2011-09-12 LAB — URINE MICROSCOPIC-ADD ON

## 2011-09-12 LAB — GRAM STAIN

## 2011-09-12 LAB — URINALYSIS, ROUTINE W REFLEX MICROSCOPIC
Bilirubin Urine: NEGATIVE
Glucose, UA: NEGATIVE mg/dL
Hgb urine dipstick: NEGATIVE
Protein, ur: 100 mg/dL — AB
Urobilinogen, UA: 1 mg/dL (ref 0.0–1.0)

## 2011-09-12 LAB — DIFFERENTIAL
Basophils Absolute: 0 10*3/uL (ref 0.0–0.1)
Basophils Relative: 0 % (ref 0–1)
Monocytes Absolute: 1.9 10*3/uL — ABNORMAL HIGH (ref 0.1–1.0)
Neutro Abs: 12.6 10*3/uL — ABNORMAL HIGH (ref 1.7–7.7)
Neutrophils Relative %: 82 % — ABNORMAL HIGH (ref 43–77)

## 2011-09-12 LAB — GLUCOSE, CAPILLARY: Glucose-Capillary: 104 mg/dL — ABNORMAL HIGH (ref 70–99)

## 2011-09-12 MED ORDER — LOSARTAN POTASSIUM 50 MG PO TABS
100.0000 mg | ORAL_TABLET | Freq: Every day | ORAL | Status: DC
  Administered 2011-09-13 – 2011-09-15 (×3): 100 mg via ORAL
  Filled 2011-09-12 (×3): qty 2

## 2011-09-12 MED ORDER — DEXTROSE 5 % IV SOLN
1.0000 g | INTRAVENOUS | Status: DC
  Administered 2011-09-12 – 2011-09-14 (×3): 1 g via INTRAVENOUS
  Filled 2011-09-12 (×4): qty 10

## 2011-09-12 MED ORDER — CARVEDILOL 6.25 MG PO TABS
6.2500 mg | ORAL_TABLET | Freq: Two times a day (BID) | ORAL | Status: DC
  Administered 2011-09-12 – 2011-09-15 (×6): 6.25 mg via ORAL
  Filled 2011-09-12 (×8): qty 1

## 2011-09-12 NOTE — Progress Notes (Signed)
Subjective:  Pt complaining of severe penile pain, worse with any movement or with micturition. This started this morning. Back pain improved. No chest pain or dyspnea.  Objective:  Vital Signs in the last 24 hours: Temp:  [97.7 F (36.5 C)-98.1 F (36.7 C)] 98.1 F (36.7 C) (02/05 0458) Pulse Rate:  [64-69] 68  (02/05 1145) Resp:  [18-20] 20  (02/05 0458) BP: (127-163)/(67-94) 163/94 mmHg (02/05 0458) SpO2:  [92 %-100 %] 92 % (02/05 1145) Weight:  [99.655 kg (219 lb 11.2 oz)] 99.655 kg (219 lb 11.2 oz) (02/05 0458)  Intake/Output from previous day: 02/04 0701 - 02/05 0700 In: 800 [P.O.:800] Out: 651 [Urine:650; Stool:1]  Physical Exam: Pt is alert and oriented, obese, chronically ill-appearing male in NAD HEENT: normal Neck: JVP - normal Lungs: CTA bilaterally CV: RRR with 2/6 systolic murmur at the left sternal border Abd: soft, NT, Positive BS, obese GU -  External genitalia normal, there is pustular discharge from the penis Ext: no C/C/E Skin: warm/dry no rash   Lab Results: No results found for this basename: WBC:2,HGB:2,PLT:2 in the last 72 hours  Basename 09/11/11 0600 09/10/11 0608  NA 128* 129*  K 3.7 3.5  CL 87* 89*  CO2 31 32  GLUCOSE 129* 101*  BUN 28* 33*  CREATININE 0.87 1.08   No results found for this basename: TROPONINI:2,CK,MB:2 in the last 72 hours  Tele: Sinus rhythm with a short run of wide complex tachycardia  Assessment/Plan:  1. Inferior MI - advanced CAD with occlusion of 2 vein grafts, patent LIMA, and severely diseased diagonal graft that required PCI. Post-MI LVEF 45%. 2. Acute systolic heart failure secondary to #1 - BP running high. Recommend increase coreg and losartan today. Continue furosemide. 3. Type 2 DM - good control on current regimen. 4. Penile pain and discharge. Check urinalysis/culture and obtain urology consultation 5. Severe deconditioning - needs SNF at discharge.   Tonny Bollman, M.D. 09/12/2011, 1:19 PM

## 2011-09-12 NOTE — Progress Notes (Signed)
Physical Therapy Treatment Patient Details Name: Mark French MRN: [id number] DOB: 1946-03-03 Today's Date: 09/12/2011  PT Assessment/Plan  PT - Assessment/Plan Comments on Treatment Session: Pt OOB in chair when PT arrived.   Pt assisted to use urinal and c/o severe burning.  This resolved and then pt attempted to stand to do exercise x 2 but was unable to tolerate d/t c/o increased burning. Did perform LAQ x 10 seated,, assist to flex trunk in chair, unable to do any other activity. Nurse aware. PT Plan: Discharge plan remains appropriate Recommendations for Other Services:  (pt states he was I prior, would CIR consult be appriopriate?) 24 hr A not available per chart PT Goals  Acute Rehab PT Goals PT Goal: Sit to Stand - Progress: Progressing toward goal PT Goal: Stand to Sit - Progress: Progressing toward goal  PT Treatment Precautions/Restrictions  Precautions Precautions: Fall Precaution Comments: monitor vitals Restrictions Weight Bearing Restrictions: No Mobility (including Balance) Transfers Transfers: Yes Sit to Stand: 3: Mod assist;From chair/3-in-1;With upper extremity assist Stand to Sit: 3: Mod assist;To chair/3-in-1 Stand to Sit Details: unconrolled descent, unable to tolerate standing d/t increased burning, attempted x 2, was min A for static balance with UE on walker Ambulation/Gait Ambulation/Gait: No      End of Session PT - End of Session Equipment Utilized During Treatment: Gait belt Activity Tolerance: Patient limited by pain Patient left: in chair Nurse Communication: Other (comment) (pain with urination and in general burning) General Behavior During Session:  (discouraged by burning) Cognition: WFL for tasks performed  Michaelene Song 09/12/2011, 11:50 AM

## 2011-09-12 NOTE — Progress Notes (Signed)
Clinical Social Worker spoke with patient's wife via phone regarding the three options for SNF's. The options are [facility], [facility] and [facility] SNF's. Patient's wife will inform CSW of final decision later this evening or early tomorrow morning. CSW will continue to follow to facilitate discharge when medically ready.  Rozetta Nunnery MSW, [employer] [phone]

## 2011-09-12 NOTE — Consult Note (Addendum)
Urology Consult  Referring physician: Elsie Ra Reason for referral: pus from penis  History of Present Illness: 66 yo male admitted Thursday for MI, post cardiac cath Thursday with stenting. MI was reported to be "large", and pt has been slow to recover. Today, he noted pus drainage from his urethra, and Urology consulted. He had gram stain ( + for gram -), and c/s obtained. Note that the pt has been voiding via condom cath, and denies premorbid difficulty voiding. Denies frequency, urgency, nocturia, hesitancy, or poor stream. CT accomplished earlier is reviewed and shows no evidence of perineal abscess.   Past Medical History  Diagnosis Date  . CORONARY ARTERY DISEASE 03/11/2007  . DIABETES MELLITUS, TYPE I 03/11/2007  . HYPERTENSION 03/11/2007  . BLINDNESS 05/21/2008  . Proliferative diabetic retinopathy 03/09/2009  . HYPERCHOLESTEROLEMIA 09/28/2008  . CERVICAL RADICULOPATHY, LEFT 09/23/2008  . ANEMIA-IRON DEFICIENCY 09/23/2008  . DEPRESSION 09/23/2008  . PERIPHERAL NEUROPATHY 09/23/2008  . MYOCARDIAL INFARCTION, HX OF 09/23/2008  . CONGESTIVE HEART FAILURE 09/23/2008  . RENAL INSUFFICIENCY 03/15/2010  . CEREBROVASCULAR ACCIDENT, HX OF 09/23/2008  . 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 Venous insufficiency of leg   . ED (erectile dysfunction)   . Charcot foot due to diabetes mellitus     chronic pain  . Hypogonadism male   . Congestive heart failure   . Duodenal ulcer 2012    multiple  . GI bleed   . Osteoarthritis   . Stroke   . Blood transfusion    Past Surgical History  Procedure Date  . Coronary artery bypass graft   . Carpal tunnel release     left  . Wrist surgery     s/p right-bone graft fusion  . Spine surgery 2010    C spine surgery for disc Dr. Yetta Barre  . Sigmoidoscopy 03/11/2001  . Venous doppler 01/30/2004  . Cataract surgery   . Ankle surgery     left  . Foot surgery     left    Medications: I have reviewed the patient's current  medications.  Allergies:  Allergies  Allergen Reactions  . Contrast Media (Iodinated Diagnostic Agents) Nausea Only  . Sildenafil Nausea Only and Other (See Comments)    Family History  Problem Relation Age of Onset  . Heart disease Mother     Coronary Artery Disease  . Heart disease Father     Coronary Artery Disease  . Heart disease Paternal Grandmother     Coronary Artery Disease  . Colon cancer Neg Hx   . Diabetes Other     Grandmother    Social History:  reports that he has never smoked. He has never used smokeless tobacco. He reports that he does not drink alcohol or use illicit drugs.  @ROS @  Physical Exam:  Vital signs in last 24 hours: Temp:  [97.1 F (36.2 C)-98.1 F (36.7 C)] 97.1 F (36.2 C) (02/05 1407) Pulse Rate:  [65-69] 66  (02/05 1407) Resp:  [18-22] 22  (02/05 1407) BP: (144-163)/(67-94) 159/77 mmHg (02/05 1407) SpO2:  [79 %-100 %] 79 % (02/05 1407) Weight:  [99.655 kg (219 lb 11.2 oz)] 99.655 kg (219 lb 11.2 oz) (02/05 0458) Physical Exam: Heent: normal. Neck: normal, no mass; chest labored breathing, but clear Cor: normal heart sounds Abd: obese. Soft. Distant BS. No mass. S-p tenderness. GU:  Buried penis. Scrotum normal. No circ. Rectal: decreased tone. Prostate 3+, benign, no mass, no expressed pus.  Extremities: warm, moves well.  Laboratory Data:  Results for orders placed during the hospital encounter of 09/07/11 (from the past 72 hour(s))  GLUCOSE, CAPILLARY     Status: Normal   Collection Time   09/09/11  9:33 PM      Component Value Range Comment   Glucose-Capillary 79  70 - 99 (mg/dL)    Comment 1 Notify RN      Comment 2 Documented in Chart     GLUCOSE, CAPILLARY     Status: Abnormal   Collection Time   09/10/11  6:02 AM      Component Value Range Comment   Glucose-Capillary 101 (*) 70 - 99 (mg/dL)    Comment 1 Notify RN      Comment 2 Documented in Chart     BASIC METABOLIC PANEL     Status: Abnormal   Collection Time   09/10/11   6:08 AM      Component Value Range Comment   Sodium 129 (*) 135 - 145 (mEq/L)    Potassium 3.5  3.5 - 5.1 (mEq/L)    Chloride 89 (*) 96 - 112 (mEq/L)    CO2 32  19 - 32 (mEq/L)    Glucose, Bld 101 (*) 70 - 99 (mg/dL)    BUN 33 (*) 6 - 23 (mg/dL)    Creatinine, Ser 1.19  0.50 - 1.35 (mg/dL)    Calcium 8.8  8.4 - 10.5 (mg/dL)    GFR calc non Af Amer 69 (*) >90 (mL/min)    GFR calc Af Amer 80 (*) >90 (mL/min)   GLUCOSE, CAPILLARY     Status: Abnormal   Collection Time   09/10/11 11:06 AM      Component Value Range Comment   Glucose-Capillary 156 (*) 70 - 99 (mg/dL)    Comment 1 Documented in Chart      Comment 2 Notify RN     HEMOGLOBIN A1C     Status: Abnormal   Collection Time   09/10/11 12:20 PM      Component Value Range Comment   Hemoglobin A1C 7.0 (*) <5.7 (%)    Mean Plasma Glucose 154 (*) <117 (mg/dL)   GLUCOSE, CAPILLARY     Status: Abnormal   Collection Time   09/10/11  3:59 PM      Component Value Range Comment   Glucose-Capillary 170 (*) 70 - 99 (mg/dL)    Comment 1 Documented in Chart      Comment 2 Notify RN     GLUCOSE, CAPILLARY     Status: Abnormal   Collection Time   09/10/11  9:40 PM      Component Value Range Comment   Glucose-Capillary 140 (*) 70 - 99 (mg/dL)    Comment 1 Documented in Chart      Comment 2 Notify RN     BASIC METABOLIC PANEL     Status: Abnormal   Collection Time   09/11/11  6:00 AM      Component Value Range Comment   Sodium 128 (*) 135 - 145 (mEq/L)    Potassium 3.7  3.5 - 5.1 (mEq/L)    Chloride 87 (*) 96 - 112 (mEq/L)    CO2 31  19 - 32 (mEq/L)    Glucose, Bld 129 (*) 70 - 99 (mg/dL)    BUN 28 (*) 6 - 23 (mg/dL)    Creatinine, Ser 1.47  0.50 - 1.35 (mg/dL)    Calcium 9.1  8.4 - 10.5 (mg/dL)    GFR calc non Af  Amer 87 (*) >90 (mL/min)    GFR calc Af Amer >90  >90 (mL/min)   GLUCOSE, CAPILLARY     Status: Abnormal   Collection Time   09/11/11  6:37 AM      Component Value Range Comment   Glucose-Capillary 126 (*) 70 - 99 (mg/dL)     Comment 1 Notify RN      Comment 2 Documented in Chart     CLOSTRIDIUM DIFFICILE BY PCR     Status: Normal   Collection Time   09/11/11 11:47 AM      Component Value Range Comment   C difficile by pcr NEGATIVE  NEGATIVE    GLUCOSE, CAPILLARY     Status: Abnormal   Collection Time   09/11/11 11:47 AM      Component Value Range Comment   Glucose-Capillary 156 (*) 70 - 99 (mg/dL)    Comment 1 Notify RN     GLUCOSE, CAPILLARY     Status: Abnormal   Collection Time   09/11/11  4:19 PM      Component Value Range Comment   Glucose-Capillary 137 (*) 70 - 99 (mg/dL)    Comment 1 Notify RN     GLUCOSE, CAPILLARY     Status: Normal   Collection Time   09/11/11  9:22 PM      Component Value Range Comment   Glucose-Capillary 82  70 - 99 (mg/dL)   GLUCOSE, CAPILLARY     Status: Normal   Collection Time   09/12/11  6:51 AM      Component Value Range Comment   Glucose-Capillary 90  70 - 99 (mg/dL)   GLUCOSE, CAPILLARY     Status: Normal   Collection Time   09/12/11 12:49 PM      Component Value Range Comment   Glucose-Capillary 88  70 - 99 (mg/dL)    Comment 1 Notify RN     URINALYSIS, ROUTINE W REFLEX MICROSCOPIC     Status: Abnormal   Collection Time   09/12/11  3:03 PM      Component Value Range Comment   Color, Urine YELLOW  YELLOW     APPearance TURBID (*) CLEAR     Specific Gravity, Urine 1.020  1.005 - 1.030     pH 8.5 (*) 5.0 - 8.0     Glucose, UA NEGATIVE  NEGATIVE (mg/dL)    Hgb urine dipstick NEGATIVE  NEGATIVE     Bilirubin Urine NEGATIVE  NEGATIVE     Ketones, ur NEGATIVE  NEGATIVE (mg/dL)    Protein, ur 409 (*) NEGATIVE (mg/dL)    Urobilinogen, UA 1.0  0.0 - 1.0 (mg/dL)    Nitrite NEGATIVE  NEGATIVE     Leukocytes, UA LARGE (*) NEGATIVE    GRAM STAIN     Status: Normal   Collection Time   09/12/11  3:03 PM      Component Value Range Comment   Specimen Description URINE, CATHETERIZED      Special Requests NONE      Gram Stain        Value: CYTOSPIN PREP     WBC PRESENT,BOTH  PMN AND MONONUCLEAR     POSITIVE FOR GRAM NEGATIVE RODS   Report Status 09/12/2011 FINAL     URINE MICROSCOPIC-ADD ON     Status: Abnormal   Collection Time   09/12/11  3:03 PM      Component Value Range Comment   Squamous Epithelial / LPF FEW (*) RARE  WBC, UA 3-6  <3 (WBC/hpf)    RBC / HPF 0-2  <3 (RBC/hpf)    Bacteria, UA MANY (*) RARE     Crystals TRIPLE PHOSPHATE CRYSTALS (*) NEGATIVE     Urine-Other AMORPHOUS URATES/PHOSPHATES     GLUCOSE, CAPILLARY     Status: Abnormal   Collection Time   09/12/11  4:18 PM      Component Value Range Comment   Glucose-Capillary 64 (*) 70 - 99 (mg/dL)    Comment 1 Notify RN     CBC     Status: Abnormal   Collection Time   09/12/11  6:46 PM      Component Value Range Comment   WBC 15.4 (*) 4.0 - 10.5 (K/uL)    RBC 5.16  4.22 - 5.81 (MIL/uL)    Hemoglobin 14.9  13.0 - 17.0 (g/dL)    HCT 16.1  09.6 - 04.5 (%)    MCV 84.5  78.0 - 100.0 (fL)    MCH 28.9  26.0 - 34.0 (pg)    MCHC 34.2  30.0 - 36.0 (g/dL)    RDW 40.9  81.1 - 91.4 (%)    Platelets 263  150 - 400 (K/uL)   DIFFERENTIAL     Status: Abnormal   Collection Time   09/12/11  6:46 PM      Component Value Range Comment   Neutrophils Relative 82 (*) 43 - 77 (%)    Neutro Abs 12.6 (*) 1.7 - 7.7 (K/uL)    Lymphocytes Relative 5 (*) 12 - 46 (%)    Lymphs Abs 0.8  0.7 - 4.0 (K/uL)    Monocytes Relative 13 (*) 3 - 12 (%)    Monocytes Absolute 1.9 (*) 0.1 - 1.0 (K/uL)    Eosinophils Relative 1  0 - 5 (%)    Eosinophils Absolute 0.1  0.0 - 0.7 (K/uL)    Basophils Relative 0  0 - 1 (%)    Basophils Absolute 0.0  0.0 - 0.1 (K/uL)    Recent Results (from the past 240 hour(s))  MRSA PCR SCREENING     Status: Normal   Collection Time   09/07/11  6:37 AM      Component Value Range Status Comment   MRSA by PCR NEGATIVE  NEGATIVE  Final   CLOSTRIDIUM DIFFICILE BY PCR     Status: Normal   Collection Time   09/11/11 11:47 AM      Component Value Range Status Comment   C difficile by pcr NEGATIVE   NEGATIVE  Final   GRAM STAIN     Status: Normal   Collection Time   09/12/11  3:03 PM      Component Value Range Status Comment   Specimen Description URINE, CATHETERIZED   Final    Special Requests NONE   Final    Gram Stain     Final    Value: CYTOSPIN PREP     WBC PRESENT,BOTH PMN AND MONONUCLEAR     POSITIVE FOR GRAM NEGATIVE RODS   Report Status 09/12/2011 FINAL   Final    Creatinine:  Basename 09/11/11 0600 09/10/11 0608 09/09/11 0600 09/08/11 0240 09/07/11 0940 09/07/11 0306 09/07/11 0255  CREATININE 0.87 1.08 1.38* 1.14 0.91 1.10 1.05   Baseline Creatinine:  Labs: Na 128, cr o.87, and wbc 15,400; Hgb 14.9/43.6.   Impression/Assessment:  The pt has acute urinary retention on CT exam. Abdomen is markedly obese, but s-p pain is relieved by foley cath insertion ( 1400cc).  He is observed for vagal response after rectal and foley cath,  with relief of his discomfort-and  remains stable.   Plan:  Advise leave foley to straight drain until he is mobile and strong enough to get to bathroom. He needs Flomax, 0.4mg /day, but will hold off on ordering pending Cardiology discussion.   Chanelle Hodsdon I 09/12/2011, 8:14 PM     Agree with Rocephin 1gm q 24 hrs until c/s returns and oral med is started.

## 2011-09-13 DIAGNOSIS — I369 Nonrheumatic tricuspid valve disorder, unspecified: Secondary | ICD-10-CM

## 2011-09-13 LAB — BASIC METABOLIC PANEL
BUN: 24 mg/dL — ABNORMAL HIGH (ref 6–23)
Calcium: 8.7 mg/dL (ref 8.4–10.5)
Creatinine, Ser: 0.97 mg/dL (ref 0.50–1.35)
GFR calc Af Amer: >90 mL/min (ref >90)
GFR calc non Af Amer: 83 mL/min — ABNORMAL LOW (ref >90)

## 2011-09-13 LAB — CBC
HCT: 41.5 % (ref 39.0–52.0)
MCH: 27.9 pg (ref 26.0–34.0)
MCHC: 32.8 g/dL (ref 30.0–36.0)
MCV: 85.2 fL (ref 78.0–100.0)
Platelets: 243 10*3/uL (ref 150–400)
RDW: 13.6 % (ref 11.5–15.5)

## 2011-09-13 LAB — GLUCOSE, CAPILLARY
Glucose-Capillary: 162 mg/dL — ABNORMAL HIGH (ref 70–99)
Glucose-Capillary: 160 mg/dL — ABNORMAL HIGH (ref 70–99)
Glucose-Capillary: 169 mg/dL — ABNORMAL HIGH (ref 70–99)

## 2011-09-13 NOTE — Progress Notes (Signed)
Subjective:  No chest pain or dyspnea. Very weak but overall he states he is feeling better. Lower abdominal/GU pain resolved after foley insertion.  Objective:  Vital Signs in the last 24 hours: Temp:  [97.9 F (36.6 C)-99.2 F (37.3 C)] 98.7 F (37.1 C) (02/06 1400) Pulse Rate:  [61-78] 70  (02/06 1803) Resp:  [18-20] 18  (02/06 1400) BP: (100-132)/(50-71) 116/55 mmHg (02/06 1803) SpO2:  [94 %-100 %] 100 % (02/06 1400)  Intake/Output from previous day: 02/05 0701 - 02/06 0700 In: 480 [P.O.:480] Out: 1900 [Urine:1900]  Physical Exam: Pt is alert and oriented, chronically ill-appearing male in NAD HEENT: normal Neck: JVP - normal Lungs: CTA bilaterally CV: RRR without murmur or gallop Abd: soft, NT, Positive BS, no hepatomegaly Ext: no C/C/E Skin: warm/dry no rash   Lab Results:  Basename 09/13/11 0715 09/12/11 1846  WBC 15.2* 15.4*  HGB 13.6 14.9  PLT 243 263    Basename 09/13/11 0715 09/11/11 0600  NA 128* 128*  K 3.9 3.7  CL 89* 87*  CO2 30 31  GLUCOSE 150* 129*  BUN 24* 28*  CREATININE 0.97 0.87   No results found for this basename: TROPONINI:2,CK,MB:2 in the last 72 hours  Tele: sinus rhythm  Assessment/Plan:  1. Inferior MI - advanced CAD with occlusion of 2 vein grafts, patent LIMA, and severely diseased diagonal graft that required PCI. Post-MI LVEF 45%.  2. Acute systolic heart failure secondary to #1 - Meds adjusted yesterday and BP better controlled. Continue furosemide.  3. Type 2 DM - good control on current regimen.  4. Urinary retention - appreciate Dr Imelda Pillow consult. Foley placed. On rocephin for UTI. Await sensitivities. Start flomax. 5. Severe deconditioning - needs SNF at discharge. Wants to go to [facility]. Anticipate medically stable early next week (Monday). Tonny Bollman, M.D. 09/13/2011, 7:20 PM

## 2011-09-13 NOTE — Progress Notes (Signed)
Pt stated feeling relief after foley in place, 1500 cc of urine noted. Urine noted going from amber to  red in color, cloudy, and with sediments. Pt noted having episode of bigeminy within 20 minutes of Urologist leaving. Vitals noted 97.4, 60, 20, 118/62, pt asymptomatic at the time. MD on call notified [facility] no new orders at this time.

## 2011-09-13 NOTE — Progress Notes (Signed)
Clinical Social Worker received call from patient's wife, Mark French, and she has not spoken with the patient yet regarding the three options for SNF's. Mark French will follow up with CSW later this evening or tomorrow morning. Wife also requested to speak to MD and CSW informed MD. CSW will continue to follow.   Rozetta Nunnery MSW, [employer] [phone]

## 2011-09-13 NOTE — Progress Notes (Signed)
Physical Therapy Treatment Patient Details Name: MARKEES CARNS MRN: [id number] DOB: [birth year] Today's Date: 09/13/2011  PT Assessment/Plan  PT - Assessment/Plan Comments on Treatment Session: Patient  was admitted with chest pain.  Patient on 6 L of O2 at rest.  Patient continues with poor endurance and poor tolerance of activity.  PAtient seemed weaker today.  Continue to recommmend SNF for therapy at d/c.   PT Plan: Discharge plan remains appropriate;Frequency remains appropriate PT Frequency: Min 3X/week Follow Up Recommendations: Skilled nursing facility;Supervision/Assistance - 24 hour Equipment Recommended: Defer to next venue PT Goals  Acute Rehab PT Goals PT Goal Formulation: With patient PT Goal: Supine/Side to Sit - Progress: Progressing toward goal PT Goal: Sit to Supine/Side - Progress: Progressing toward goal PT Goal: Sit to Stand - Progress: Progressing toward goal PT Goal: Stand to Sit - Progress: Progressing toward goal PT Transfer Goal: Bed to Chair/Chair to Bed - Progress: Progressing toward goal PT Goal: Perform Home Exercise Program - Progress: Progressing toward goal  PT Treatment Precautions/Restrictions  Precautions Precautions: Fall Precaution Comments: monitor vitals Required Braces or Orthoses: No Restrictions Weight Bearing Restrictions: No Mobility (including Balance) Bed Mobility Supine to Sit: 4: Min assist Supine to Sit Details (indicate cue type and reason): Patient needed cuing for technique Sit to Supine: Not Tested (comment) Transfers Sit to Stand: 1: +2 Total assist;Patient percentage (comment);With upper extremity assist;From bed (pt = 50%) Sit to Stand Details (indicate cue type and reason): Pt. needed cues for hand placement.  Could not achieve full upright stance secondary to trunk slightly flexed and bil knees flexed throughout Stand to Sit: 1: +2 Total assist;Patient percentage (comment);With upper extremity assist;With armrests;To  chair/3-in-1 (pt = 50%) Stand to Sit Details: uncontrolled descent into chair.   Stand Pivot Transfers: 1: +2 Total assist;Patient percentage (comment) (pt = 50%) Stand Pivot Transfer Details (indicate cue type and reason): Patient needed cues and assist for weight shifting and for guidance of hips to chair.  Crouched stance throughout Ambulation/Gait Ambulation/Gait: No Stairs: No Wheelchair Mobility Wheelchair Mobility: No  Posture/Postural Control Posture/Postural Control: Postural limitations Postural Limitations: Flexed posture in general Balance Balance Assessed: No Static Sitting Balance Static Sitting - Balance Support: Bilateral upper extremity supported;Feet supported Static Sitting - Level of Assistance: 3: Mod assist Static Sitting - Comment/# of Minutes: 3 minutes; patient was leaning posteriorly and could not sit EOB without constant help.  Able to get COM stable for only seconds at a time.   Dynamic Sitting Balance Dynamic Sitting - Level of Assistance: Not tested (comment) Exercise  Total Joint Exercises Heel Slides: AROM;Both;10 reps;Seated Long Arc Quad: AROM;Both;10 reps;Seated End of Session PT - End of Session Equipment Utilized During Treatment: Gait belt Activity Tolerance: Patient limited by fatigue Patient left: in chair;with call bell in reach Nurse Communication: Mobility status for transfers General Behavior During Session: [facility] for tasks performed Cognition: [facility] for tasks performed  INGOLD,Perlie Scheuring 09/13/2011, 1:40 PM  [facility] [phone] (pager)

## 2011-09-14 DIAGNOSIS — I2119 ST elevation (STEMI) myocardial infarction involving other coronary artery of inferior wall: Secondary | ICD-10-CM

## 2011-09-14 LAB — GLUCOSE, CAPILLARY
Glucose-Capillary: 186 mg/dL — ABNORMAL HIGH (ref 70–99)
Glucose-Capillary: 123 mg/dL — ABNORMAL HIGH (ref 70–99)
Glucose-Capillary: 151 mg/dL — ABNORMAL HIGH (ref 70–99)

## 2011-09-14 LAB — URINE CULTURE: Colony Count: >=100000

## 2011-09-14 MED ORDER — INSULIN NPH (HUMAN) (ISOPHANE) 100 UNIT/ML ~~LOC~~ SUSP
15.0000 [IU] | Freq: Every day | SUBCUTANEOUS | Status: DC
  Administered 2011-09-14: 15 [IU] via SUBCUTANEOUS

## 2011-09-14 NOTE — Progress Notes (Addendum)
Patient had an episode where he became cold and clammy and very sleepy, however, easy to arouse and opened eyes when called.  Vital signs were checked and remained stable, blood sugar was 112.  Rapid response was called and assessed client, no distress upon her arrival, client more awake and eating.    Dr. Excell Seltzer notified of incident and the need for assessment of the need for foley.  We are to contact the urologist that was consulted.    1530  Urologist office was called and message was left with answering service related to patient leaving with foley. 41  Social worker notified.  Thanks.

## 2011-09-14 NOTE — Progress Notes (Signed)
Clinical Social Worker received decision for SNF and the family chooses [facility]. Patient is planned to be transported via EMS to facility. CSW will continue to follow to facilitate discharge to SNF.   Rozetta Nunnery MSW, [employer] [phone]

## 2011-09-14 NOTE — Progress Notes (Signed)
Subjective:  Says he transferred a little easier to the chair this morning. No chest pain or dyspnea. Weak but no other complaints.  Objective:  Vital Signs in the last 24 hours: Temp:  [97.8 F (36.6 C)-98.7 F (37.1 C)] 98.3 F (36.8 C) (02/07 0521) Pulse Rate:  [62-70] 70  (02/07 0521) Resp:  [18-20] 18  (02/07 0521) BP: (100-132)/(50-67) 132/67 mmHg (02/07 0521) SpO2:  [94 %-100 %] 97 % (02/07 0521) Weight:  [96.253 kg (212 lb 3.2 oz)] 96.253 kg (212 lb 3.2 oz) (02/07 0521)  Intake/Output from previous day: 02/06 0701 - 02/07 0700 In: 700 [P.O.:700] Out: 3050 [Urine:3050]  Physical Exam: Pt is alert and oriented, chronically il-appearing male in NAD HEENT: normal Neck: JVP - normal, carotids 2+= without bruits Lungs: CTA bilaterally CV: RRR without murmur or gallop Abd: soft, NT, Positive BS, obese Ext: no C/C/E Skin: warm/dry no rash   Lab Results:  Basename 09/13/11 0715 09/12/11 1846  WBC 15.2* 15.4*  HGB 13.6 14.9  PLT 243 263    Basename 09/13/11 0715  NA 128*  K 3.9  CL 89*  CO2 30  GLUCOSE 150*  BUN 24*  CREATININE 0.97   No results found for this basename: TROPONINI:2,CK,MB:2 in the last 72 hours  Tele: sinus rhythm, no arrhythmia  Assessment/Plan:  1. Inferior MI - tolerating appropriate med Rx with ASA, plavix, statin, beta blocker, ARB, and aldactone. LVEF moderately depressed.  2. Acute systolic heart failure. Compensated with therapy as above.  3. Type 2 DM 4. Urinary retention. Flomax started yesterday and foley placed 2/5. Likely keep foley in a few more days until flomax effective, then try to d/c. Will follow urology recs. 5. Severe deconditioning - related to diabetes/obesity/multiple med problems. Continue PT. SNF at discharge. Anticipate discharge early next week.    Tonny Bollman, M.D. 09/14/2011, 7:56 AM

## 2011-09-15 DIAGNOSIS — I2119 ST elevation (STEMI) myocardial infarction involving other coronary artery of inferior wall: Secondary | ICD-10-CM

## 2011-09-15 LAB — BASIC METABOLIC PANEL
GFR calc Af Amer: >90 mL/min (ref >90)
GFR calc non Af Amer: 85 mL/min — ABNORMAL LOW (ref >90)
Glucose, Bld: 126 mg/dL — ABNORMAL HIGH (ref 70–99)
Potassium: 3.9 mEq/L (ref 3.5–5.1)
Sodium: 128 mEq/L — ABNORMAL LOW (ref 135–145)

## 2011-09-15 LAB — CBC
MCH: 27.4 pg (ref 26.0–34.0)
MCV: 85.3 fL (ref 78.0–100.0)
Platelets: 291 10*3/uL (ref 150–400)
RBC: 4.89 MIL/uL (ref 4.22–5.81)
RDW: 13.6 % (ref 11.5–15.5)

## 2011-09-15 LAB — GLUCOSE, CAPILLARY

## 2011-09-15 MED ORDER — "INTERDRY AG TEXTILE 10""X12' EX MISC": 1.0000 | CUTANEOUS | Status: DC

## 2011-09-15 MED ORDER — ASPIRIN 81 MG PO CHEW: 81.0000 mg | CHEWABLE_TABLET | Freq: Every day | ORAL | Status: DC

## 2011-09-15 MED ORDER — FUROSEMIDE 40 MG PO TABS: 40.0000 mg | ORAL_TABLET | Freq: Every day | ORAL | Status: DC

## 2011-09-15 MED ORDER — TAMSULOSIN HCL 0.4 MG PO CAPS
0.4000 mg | ORAL_CAPSULE | Freq: Every day | ORAL | Status: DC
  Administered 2011-09-15: 0.4 mg via ORAL
  Filled 2011-09-15: qty 1

## 2011-09-15 MED ORDER — ROSUVASTATIN CALCIUM 40 MG PO TABS: 40.0000 mg | ORAL_TABLET | Freq: Every day | ORAL | Status: DC

## 2011-09-15 MED ORDER — CLOPIDOGREL BISULFATE 75 MG PO TABS: 75.0000 mg | ORAL_TABLET | Freq: Every day | ORAL | Status: DC

## 2011-09-15 MED ORDER — HYDROCODONE-ACETAMINOPHEN 10-325 MG PO TABS: 1.0000 | ORAL_TABLET | ORAL | Status: DC | PRN

## 2011-09-15 MED ORDER — INSULIN ASPART 100 UNIT/ML ~~LOC~~ SOLN: 15.0000 [IU] | Freq: Three times a day (TID) | SUBCUTANEOUS | Status: DC

## 2011-09-15 MED ORDER — TAMSULOSIN HCL 0.4 MG PO CAPS: 0.4000 mg | ORAL_CAPSULE | Freq: Every day | ORAL | Status: DC

## 2011-09-15 MED ORDER — SPIRONOLACTONE 25 MG PO TABS: 12.5000 mg | ORAL_TABLET | Freq: Every day | ORAL | Status: DC

## 2011-09-15 MED ORDER — CIPROFLOXACIN HCL 500 MG PO TABS: 500.0000 mg | ORAL_TABLET | Freq: Two times a day (BID) | ORAL | Status: AC

## 2011-09-15 MED ORDER — CARVEDILOL 6.25 MG PO TABS: 6.2500 mg | ORAL_TABLET | Freq: Two times a day (BID) | ORAL | Status: DC

## 2011-09-15 NOTE — Progress Notes (Signed)
Patient Name: Mark French Date of Encounter: 09/15/2011  Active Problems:  DM2 (diabetes mellitus, type 2)  Physical deconditioning    SUBJECTIVE: Pt denies CP/SOB. Has DOE with ambulation but no palpitations or other symptoms. He is very weak but feels he is improving.   OBJECTIVE  Filed Vitals:   09/14/11 1316 09/14/11 1706 09/14/11 2119 09/15/11 0504  BP: 120/79 126/76 110/67 114/75  Pulse: 60 65 64 71  Temp: 97.7 F (36.5 C)  97.8 F (36.6 C) 98.3 F (36.8 C)  TempSrc: Oral  Oral Oral  Resp: 20  20 18   Height:      Weight:    214 lb (97.07 kg)  SpO2: 100%  97% 99%    Intake/Output Summary (Last 24 hours) at 09/15/11 1218 Last data filed at 09/15/11 0900  Gross per 24 hour  Intake    300 ml  Output   1076 ml  Net   -776 ml   Weight change: 1 lb 12.8 oz (0.816 kg) Wt Readings from Last 3 Encounters:  09/15/11 214 lb (97.07 kg)  09/15/11 214 lb (97.07 kg)  08/31/11 230 lb (104.327 kg)     PHYSICAL EXAM  General: Well developed, well nourished, in no acute distress. Head: Normocephalic, atraumatic.  Neck: Supple without bruits, JVD. Lungs:  Resp regular and unlabored, CTA. Heart: RRR, S1, S2, no S3, S4, or murmurs. Abdomen: Soft, non-tender, non-distended, BS + x 4.  Extremities: No clubbing, cyanosis, edema.  Neuro: Alert and oriented X 3. Moves all extremities spontaneously. Psych: Normal affect.  LABS:  CBC: Basename 09/15/11 0625 09/13/11 0715 09/12/11 1846  WBC 10.3 15.2* --  NEUTROABS -- -- 12.6*  HGB 13.4 13.6 --  HCT 41.7 41.5 --  MCV 85.3 85.2 --  PLT 291 243 --   Basic Metabolic Panel: Basename 09/15/11 0625 09/13/11 0715  NA 128* 128*  K 3.9 3.9  CL 90* 89*  CO2 25 30  GLUCOSE 126* 150*  BUN 21 24*  CREATININE 0.91 0.97  CALCIUM 8.6 8.7  MG -- --  PHOS -- --   Lab Results  Component Value Date   CKTOTAL 2428* 09/07/2011   CKMB 178.5* 09/07/2011   TROPONINI >25.00* 09/07/2011    BNP: Pro B Natriuretic peptide (BNP)    Date/Time Value Range Status  09/07/2011  9:40 AM 1365.0* 0-125 (pg/mL) Final  08/23/2011 12:43 PM 490.1* 0-125 (pg/mL) Final    TELE:        Radiology/Studies: Dg Chest 2 View  08/31/2011  *RADIOLOGY REPORT*  Clinical Data: Weakness  CHEST - 2 VIEW  Comparison: 08/22/2011  Findings: The patient is status post median sternotomy CABG procedure.  Heart size is moderately enlarged.  There is no pleural effusion or pulmonary edema.  No airspace consolidation identified.  IMPRESSION:  1.  Cardiac enlargement without evidence for heart failure.  Original Report Authenticated By: Rosealee Albee, M.D.    Current Medications:     . aspirin  81 mg Oral Daily  . carvedilol  6.25 mg Oral BID WC  . cefTRIAXone (ROCEPHIN) IVPB 1 gram/50 mL D5W  1 g Intravenous Q24H  . clopidogrel  75 mg Oral Q breakfast  . furosemide  40 mg Oral Daily  . gabapentin  300 mg Oral BID  . insulin aspart  0-20 Units Subcutaneous TID WC  . insulin aspart  15 Units Subcutaneous TID WC  . insulin NPH  15 Units Subcutaneous QHS  . insulin NPH  25 Units  Subcutaneous QAC breakfast  . losartan  100 mg Oral Daily  . ondansetron  4 mg Intravenous Once  . pantoprazole  40 mg Oral Q1200  . rosuvastatin  40 mg Oral q1800  . sodium chloride  3 mL Intravenous Q12H  . spironolactone  12.5 mg Oral Daily  . Tamsulosin HCl  0.4 mg Oral Daily  . DISCONTD: clotrimazole   Topical BID  . DISCONTD: insulin NPH  15 Units Subcutaneous QHS    ASSESSMENT AND PLAN: 1. Inferior MI - tolerating appropriate med Rx with ASA, plavix, statin, beta blocker, ARB, and aldactone. LVEF moderately depressed.   2. Acute systolic heart failure. Compensated with therapy as above. Follow weights as OP.  3. Type 2 DM - continue Rx  4. Urinary retention. Flomax started yesterday and foley placed 2/5. Likely keep foley in a few more days until seen in ofc. Will follow urology recs on ABX and they will arrange f/u in ofc.  5. Hyponatremia -  follow  6.Severe deconditioning - related to diabetes/obesity/multiple med problems. Continue PT. SNF at discharge. Anticipate discharge when medically stable, possibly today.   Signed, Theodore Demark , PA-C 12:18 PM 09/15/2011  Patient seen, examined. Available data reviewed. Agree with findings, assessment, and plan as outlined by Theodore Demark, PA-C. Pt was independently examined. Reviewed notes of urology and appreciate their management. Pt is medically stable and ready for discharge to SNF. Meds reviewed. Will arrange follow-up within 2 weeks in our office.  Tonny Bollman, M.D. 09/15/2011 12:33 PM

## 2011-09-15 NOTE — Progress Notes (Signed)
Transported via [facility] to [facility], packet given to EMT, Berle Mull RN

## 2011-09-15 NOTE — Progress Notes (Signed)
Physical Therapy Treatment Patient Details Name: Mark French MRN: [id number] DOB: [birth year] Today's Date: 09/15/2011  PT Assessment/Plan  PT - Assessment/Plan Comments on Treatment Session: Much improved ability to assist with mobility/transfers.  Still a bit self-limiting, but starting to push himself PT Plan: Discharge plan remains appropriate PT Frequency: Min 3X/week Follow Up Recommendations: Skilled nursing facility Equipment Recommended: Defer to next venue PT Goals  Acute Rehab PT Goals PT Goal: Supine/Side to Sit - Progress: Progressing toward goal PT Goal: Sit to Stand - Progress: Progressing toward goal PT Goal: Stand to Sit - Progress: Progressing toward goal PT Transfer Goal: Bed to Chair/Chair to Bed - Progress: Progressing toward goal PT Goal: Ambulate - Progress: Progressing toward goal  PT Treatment Precautions/Restrictions  Precautions Precautions: Fall Precaution Comments: monitor vitals Required Braces or Orthoses: No Restrictions Weight Bearing Restrictions: No Mobility (including Balance) Bed Mobility Bed Mobility: Yes Supine to Sit: 4: Min assist;Other (comment) (via L elbow) Supine to Sit Details (indicate cue type and reason): via left elbow;  minimal struggle pushing up from elbow and needed min assist Transfers Transfers: Yes Sit to Stand: 1: +2 Total assist;Patient percentage (comment);With armrests;From bed;From chair/3-in-1;Other (comment) (pt >=75% to min A as pt became more confident) Sit to Stand Details (indicate cue type and reason): vc's for hand placement, visual cues for technique Stand to Sit: 4: Min assist;To bed;To chair/3-in-1 Stand to Sit Details: visual cues for technique for controlled descent ; vc's for hand placement Ambulation/Gait Ambulation/Gait: Yes (incl pregait act.--maching in place with w/s practice) Ambulation/Gait Assistance: 3: Mod assist (to min assist with improved confidence) Ambulation/Gait Assistance Details  (indicate cue type and reason): Initially stood in the RW and worked on w/shifts, then took steps in place, then progressed to walking 2 to 3 feet forward and back then amb. with RW for 8 to 10 feet times 2 Ambulation Distance (Feet): 8 Feet ( then 10, with several trial of walking forward and back) Assistive device: Rolling walker Gait Pattern: Step-through pattern;Decreased step length - left;Decreased stance time - right;Decreased stride length;Right foot flat;Left foot flat;Shuffle;Scissoring (variability depending on fatigue) Stairs: No  Posture/Postural Control Posture/Postural Control: No significant limitations Balance Balance Assessed: Yes Static Sitting Balance Static Sitting - Balance Support: Bilateral upper extremity supported;Feet supported Static Sitting - Level of Assistance: 5: Stand by assistance Static Sitting - Comment/# of Minutes: 5 (mild difficulty moving forward from supported sit to upright) Exercise    End of Session PT - End of Session Nurse Communication: Mobility status for transfers;Mobility status for ambulation General Behavior During Session: [facility] for tasks performed Cognition: [facility] for tasks performed  Abbigael Detlefsen, Eliseo Gum 09/15/2011, 12:21 PM  09/15/2011  Inyo Bing, PT [phone] (pager)

## 2011-09-15 NOTE — Progress Notes (Signed)
Report called to receiving nurse at [facility] [place], all questions answered for receiving facility, IV and monitor discontinued, awaiting EMS transport to [facility] , remains on 1 lpm nasal cannula, foley catheter to remain in per instructions of urologist, Berle Mull RN

## 2011-09-15 NOTE — Progress Notes (Signed)
8 Days Post-Op Subjective: Patient reports feeling much better since foley placed.  Abd pain resolved.  He also just had a BM.  Tolerating foley without problem.  Objective: Vital signs in last 24 hours: Temp:  [97.7 F (36.5 C)-98.3 F (36.8 C)] 98.3 F (36.8 C) (02/08 0504) Pulse Rate:  [60-71] 71  (02/08 0504) Resp:  [18-20] 18  (02/08 0504) BP: (110-126)/(67-79) 114/75 mmHg (02/08 0504) SpO2:  [97 %-100 %] 99 % (02/08 0504) Weight:  [97.07 kg (214 lb)] 97.07 kg (214 lb) (02/08 0504)  Intake/Output from previous day: 02/07 0701 - 02/08 0700 In: 780 [P.O.:780] Out: 1426 [Urine:1425; Stool:1]  Physical Exam:  General:alert, cooperative and no distress GI: soft, non tender, normal bowel sounds, no palpable masses Foley in place with yellow urine in bag.  Lab Results:  Basename 09/15/11 0625 09/13/11 0715 09/12/11 1846  HGB 13.4 13.6 14.9  HCT 41.7 41.5 43.6   BMET  Basename 09/15/11 0625 09/13/11 0715  NA 128* 128*  K 3.9 3.9  CL 90* 89*  CO2 25 30  GLUCOSE 126* 150*  BUN 21 24*  CREATININE 0.91 0.97  CALCIUM 8.6 8.7     Results for orders placed during the hospital encounter of 09/07/11  MRSA PCR SCREENING     Status: Normal   Collection Time   09/07/11  6:37 AM      Component Value Range Status Comment   MRSA by PCR NEGATIVE  NEGATIVE  Final   CLOSTRIDIUM DIFFICILE BY PCR     Status: Normal   Collection Time   09/11/11 11:47 AM      Component Value Range Status Comment   C difficile by pcr NEGATIVE  NEGATIVE  Final   URINE CULTURE     Status: Normal   Collection Time   09/12/11  3:03 PM      Component Value Range Status Comment   Specimen Description URINE, CATHETERIZED   Final    Special Requests NONE   Final    Culture  Setup Time [id number]   Final    Colony Count >=100,000 COLONIES/ML   Final    Culture PROTEUS MIRABILIS   Final    Report Status 09/14/2011 FINAL   Final    Organism ID, Bacteria PROTEUS MIRABILIS   Final   GRAM STAIN     Status:  Normal   Collection Time   09/12/11  3:03 PM      Component Value Range Status Comment   Specimen Description URINE, CATHETERIZED   Final    Special Requests NONE   Final    Gram Stain     Final    Value: CYTOSPIN PREP     WBC PRESENT,BOTH PMN AND MONONUCLEAR     POSITIVE FOR GRAM NEGATIVE RODS   Report Status 09/12/2011 FINAL   Final     Assessment/Plan: 8 Days Post-Op Procedure(s) (LRB): LEFT HEART CATHETERIZATION WITH CORONARY/GRAFT ANGIOGRAM (N/A)   UTI: urine culture reveals P. Mirabilis sensitive to Rocephin and Cipro.  Pt has received 3 days of Rocephin.  Should change to and d/c to SNF on Cipro 500mg  po BID x 7 days to complete treatment.  AUR: Pt had foley placed on 09/12/11.  He has not been started on Flomax.  Ordered placed today to start Flomax 0.4mg  po QD which should be continued indefinitely.  Pt should d/c to SNF with foley in place and return to Dr. Patsi Sears as an outpt in 1-2 weeks for void trial.  Office  made aware and appt will be made.    LOS: 8 days   YARBROUGH,Shandora Koogler G. 09/15/2011, 9:02 AM

## 2011-09-15 NOTE — Discharge Summary (Signed)
CARDIOLOGY DISCHARGE SUMMARY   Patient ID: Mark French MRN: [id number] DOB/AGE: [birth year] 66 y.o.  Admit date: 09/07/2011 Discharge date: 09/15/2011  Primary Discharge Diagnosis: Inferior STEMI with brief cardiac arrest Secondary Discharge Diagnosis:  Patient Active Problem List  Diagnoses  . DIABETES MELLITUS, TYPE I  . HYPOGONADISM  . HYPERCHOLESTEROLEMIA  . ANEMIA-IRON DEFICIENCY  . DEPRESSION  . PERIPHERAL NEUROPATHY  . PROLIFERATIVE DIABETIC RETINOPATHY  . BLINDNESS  . HYPERTENSION  . MYOCARDIAL INFARCTION, HX OF  . CORONARY ARTERY DISEASE  . CONGESTIVE HEART FAILURE  . ALLERGIC RHINITIS  . RENAL INSUFFICIENCY  . ULCER, LEG  . ARTHROPATHY ASSOCIATED W/NEUROLOGICAL DISORDERS  . CERVICAL RADICULOPATHY, LEFT  . MYALGIA  . EDEMA  . CEREBROVASCULAR ACCIDENT, HX OF  . HYPERKALEMIA  . PERIPHERAL VASCULAR INSUFFICIENCY,  LEGS, BILATERAL  . LEG PAIN  . DYSPNEA  . Hypocalcemia  . Altered mental status  . Hypothermia  . DM2 (diabetes mellitus, type 2)  . Physical deconditioning    Consults: Urology, physical therapy, wound care  Procedures: Left heart catheterization with selective coronary angiography, left ventriculogram. SVG angio; arterial conduit angiogram; PCI SVG; Temporary pacer implantation   Hospital Course: Mr. Zilka is a 66 year old male with a history of coronary artery disease. He came to the hospital and was noted to have an inferior STEMI. He was taken to the cath lab. IMPRESSIONS:  1.Severe three-vessel native coronary artery disease. Patent LIMA to LAD. Chronically occluded SVG to RCA. Chronically occluded SVG to circumflex. 2. 95% stenosis in the distal SVG to diagonal graft. Successfully stented with a 2.25 x 16 drug-eluting stent.  3. transient complete heart block requiring CPR for about 20 seconds after revascularization. Temporary transvenous pacemaker placed after this episode.  Despite requiring CPR for about 20 seconds, the patient did  not require intubation. He was started on dual antiplatelet therapy and transferred to CCU. He was volume overloaded after the procedure and started on IV Lasix. He was not immediately started on a beta blocker because of his complete heart block and cardiac arrest. The temporary pacemaker was removed post procedure after his rhythm stabilized.  A wound care consult was called because of intertriginous candida. He is on treatment for this which should continue. He had problems with nausea and vomiting which was treated symptomatically. He also had problems with abdominal pain and with urination including pus drainage. He was diagnosed with a urinary tract infection. A urine culture and sensitivity was also performed which is guiding antibiotic therapy. A urology consult was called. A Foley catheter was placed and this is left in. Per their recommendations, he is to leave the Foley in until he follows up with Dr. Cassell Smiles of urology. He was also started on Flomax.  Mr. Kronberg was very weak post MI and slow to recover. He was seen by physical therapy and followed by them during his hospital stay. Skilled nursing facility placement was recommended at discharge and case management was involved. The patient and his wife were updated on the recommendations and selected [facility] nursing home.   By 09/15/2011, urology and transitioned his antibiotics to oral medications. His volume status was significantly improved. He was continuing to work with physical therapy and tolerating this well. He was evaluated by Dr. Excell Seltzer and considered stable for discharge, to be followed closely as an outpatient.   Labs:   Lab Results  Component Value Date   WBC 10.3 09/15/2011   HGB 13.4 09/15/2011   HCT 41.7 09/15/2011  MCV 85.3 09/15/2011   PLT 291 09/15/2011    Lab 09/15/11 0625 09/09/11 0600  NA 128* --  K 3.9 --  CL 90* --  CO2 25 --  BUN 21 --  CREATININE 0.91 --  CALCIUM 8.6 --  PROT -- 7.0  BILITOT -- 0.5    ALKPHOS -- 76  ALT -- 48  AST -- 113*  GLUCOSE 126* --   Lab Results  Component Value Date   CKTOTAL 2428* 09/07/2011   CKMB 178.5* 09/07/2011   TROPONINI >25.00* 09/07/2011   Pro B Natriuretic peptide (BNP)  Date/Time Value Range Status  09/07/2011  9:40 AM 1365.0* 0-125 (pg/mL) Final  08/23/2011 12:43 PM 490.1* 0-125 (pg/mL) Final   Radiology:  Ct Pelvis Wo Contrast  09/12/2011  *RADIOLOGY REPORT*  Clinical Data:  Urinary tract infection.  Renal insufficiency.  CT PELVIS WITHOUT CONTRAST  Technique:  Multidetector CT imaging of the pelvis was performed following the standard protocol without intravenous contrast.  Comparison:  02/20/2005  Findings:  Patchy aortoiliac arterial calcifications.  Visualized portions of small bowel and colon are decompressed, unremarkable.  There is marked distention of the urinary bladder, extending to the level of the umbilicus. There is mild right ureterectasis.  There is mild prostatic prominence as before.  No pelvic ascites or extraperitoneal fluid collections. Probable old post-traumatic deformity of the left iliac wing.  Regional bones otherwise unremarkable.  There is a small umbilical hernia containing only mesenteric fat.  IMPRESSION:  1.  Massive distention of the urinary bladder and mild right ureterectasis. 2.  Umbilical hernia containing only mesenteric fat.  Original Report Authenticated By: Osa Craver, M.D.    Dg Chest Portable 1 View  09/07/2011  *RADIOLOGY REPORT*  Clinical Data: Chest pain  PORTABLE CHEST - 1 VIEW  Comparison:  08/31/2011  Findings: Stable postoperative changes in the mediastinum.  Cardiac enlargement with mild prominence of pulmonary vascularity.  Hazy appearance of the heart and pulmonary vascularity may represent motion artifact versus early edema.  No blunting of costophrenic angles.  No pneumothorax.  No focal airspace consolidation. Postoperative change in the base of the neck.  IMPRESSION:  Cardiac enlargement  with pulmonary vascular congestion and suggestion of early edema versus motion artifact in the lungs.  Original Report Authenticated By: Marlon Pel, M.D.    EKG:08-Sep-2011 06:30:01 Sinus rhythm with Premature supraventricular complexes, rate 75 Right bundle branch block , plus right ventricular hypertrophy Possible Lateral infarct , age undetermined Inferior infarct , age undetermined   Echo: 09/07/2011  Study Conclusions - Left ventricle: Very poor image quality. EF hard to judge. Inferior and posterior lateral wall appear hypokinetic Wall thickness was increased in a pattern of mild LVH. The estimated ejection fraction was 45%. - Left atrium: The atrium was mildly dilated.    FOLLOW UP PLANS AND APPOINTMENTS Discharge Orders    Future Appointments: Provider: Department: Dept Phone: Center:   10/03/2011 11:30 AM Beatrice Lecher, PA [facility] [phone] [facility]     Future Orders Please Complete By Expires   Diet Carb Modified      Diet - low sodium heart healthy      Scheduling Instructions:   2 Gram Sodium   Increase activity slowly        Allergies  Allergen Reactions  . Contrast Media (Iodinated Diagnostic Agents) Nausea Only  . Sildenafil Nausea Only and Other (See Comments)   Medication List  As of 09/15/2011 12:35 PM   STOP taking  these medications         aspirin 325 MG tablet         TAKE these medications         aspirin 81 MG chewable tablet   Chew 1 tablet (81 mg total) by mouth daily.      carvedilol 6.25 MG tablet   Commonly known as: COREG   Take 1 tablet (6.25 mg total) by mouth 2 (two) times daily with a meal.      clopidogrel 75 MG tablet   Commonly known as: PLAVIX   Take 1 tablet (75 mg total) by mouth daily with breakfast.      clotrimazole-betamethasone cream   Commonly known as: LOTRISONE   Apply 1 application topically 2 (two) times daily as needed. For rash      furosemide 40 MG tablet   Commonly known as:  LASIX   Take 1 tablet (40 mg total) by mouth daily.      gabapentin 300 MG capsule   Commonly known as: NEURONTIN   Take 300 mg by mouth 2 (two) times daily.      HYDROcodone-acetaminophen 10-325 MG per tablet   Commonly known as: NORCO   Take 1 tablet by mouth every 4 (four) hours as needed. For pain      insulin aspart 100 UNIT/ML injection   Commonly known as: novoLOG   Inject 15 Units into the skin 3 (three) times daily with meals.      insulin NPH 100 UNIT/ML injection   Commonly known as: HUMULIN N,NOVOLIN N   Inject 25 Units into the skin daily before breakfast.      insulin regular 100 units/mL injection   Commonly known as: NOVOLIN R,HUMULIN R   Inject 15 Units into the skin as needed. As needed ONLY for blood sugars >200      INSULIN SYRINGE .5CC/31GX5/16" 31G X 5/16" 0.5 ML Misc   As directed      losartan 50 MG tablet   Commonly known as: COZAAR   Take 50 mg by mouth daily.      omeprazole 20 MG capsule   Commonly known as: PRILOSEC   Take 20 mg by mouth daily.      rosuvastatin 40 MG tablet   Commonly known as: CRESTOR   Take 1 tablet (40 mg total) by mouth daily at 6 PM.      spironolactone 25 MG tablet   Commonly known as: ALDACTONE   Take 0.5 tablets (12.5 mg total) by mouth daily.      STOOL SOFTENER 100 MG capsule   Generic drug: Docusate Sodium   Take 100 mg by mouth daily as needed. constipation      Tamsulosin HCl 0.4 MG Caps   Commonly known as: FLOMAX   Take 1 capsule (0.4 mg total) by mouth daily.           Follow-up Information    Follow up with [name], MD in 2 weeks. (As needed)       Follow up with Tereso Newcomer, PA. (See for Dr Excell Seltzer on February 26th at 11:30 )    Contact information:   [address] [place] [address] [phone]          BRING ALL MEDICATIONS WITH YOU TO FOLLOW UP APPOINTMENTS  Time spent with patient to include physician time: 48 min Signed: Theodore Demark 09/15/2011, 12:35 PM Co-Sign MD

## 2011-09-15 NOTE — Progress Notes (Signed)
Clinical Social Worker will facilitate patient discharge once discharge summary is complete by contacting family and facility, [facility]. Patient will be transported via EMS. Per Jill Side at [facility], the facility will need to receive patient by 2:45pm today.  Rozetta Nunnery MSW, [employer] [phone]

## 2011-09-17 NOTE — Discharge Summary (Signed)
Agree as outlined. Please see my progress note this same date. 

## 2011-10-03 ENCOUNTER — Encounter: Payer: Medicare Other | Admitting: Physician Assistant

## 2011-10-13 ENCOUNTER — Ambulatory Visit (INDEPENDENT_AMBULATORY_CARE_PROVIDER_SITE_OTHER): Payer: Medicare Other | Admitting: Physician Assistant

## 2011-10-13 ENCOUNTER — Encounter: Payer: Self-pay | Admitting: Physician Assistant

## 2011-10-13 VITALS — BP 138/62 | HR 79 | Ht 69.0 in | Wt 233.1 lb

## 2011-10-13 DIAGNOSIS — I5022 Chronic systolic (congestive) heart failure: Secondary | ICD-10-CM

## 2011-10-13 DIAGNOSIS — R609 Edema, unspecified: Secondary | ICD-10-CM

## 2011-10-13 DIAGNOSIS — E7800 Pure hypercholesterolemia, unspecified: Secondary | ICD-10-CM

## 2011-10-13 DIAGNOSIS — I2510 Atherosclerotic heart disease of native coronary artery without angina pectoris: Secondary | ICD-10-CM

## 2011-10-13 DIAGNOSIS — I10 Essential (primary) hypertension: Secondary | ICD-10-CM

## 2011-10-13 DIAGNOSIS — N390 Urinary tract infection, site not specified: Secondary | ICD-10-CM

## 2011-10-13 LAB — BASIC METABOLIC PANEL
BUN: 18 mg/dL (ref 6–23)
Chloride: 98 mEq/L (ref 96–112)
Potassium: 4.2 mEq/L (ref 3.5–5.1)
Sodium: 135 mEq/L (ref 135–145)

## 2011-10-13 MED ORDER — NITROGLYCERIN 0.4 MG SL SUBL: 0.4000 mg | SUBLINGUAL_TABLET | SUBLINGUAL | Status: DC | PRN

## 2011-10-13 MED ORDER — FUROSEMIDE 40 MG PO TABS: ORAL_TABLET | ORAL | Status: DC

## 2011-10-13 NOTE — Patient Instructions (Addendum)
Your physician recommends that you schedule a follow-up appointment in: 4 WEEKS WITH DR. Excell Seltzer  Your physician recommends that you return for lab work in: TODAY BMET 414.04, 428.22  Your physician recommends that you return for lab work in: 1 WEEK BMET 414.04, [phone]  Your physician has recommended you make the following change in your medication: INCREASE LASIX TO 40 MG TWICE DAILY FOR 3 DAYS ONLY;  THEN DECREASE LASIX TO 60 MG DAILY; A PRESCRIPTION FOR NITROGLYCERIN HAS BEEN SENT IN TODAY FOR YOU

## 2011-10-13 NOTE — Progress Notes (Signed)
[address] [place], [place]  [address] Phone: [phone] Fax:  [phone]  Date:  10/13/2011   Name:  Mark French       DOB:  1945/12/13 MRN:  [id number]  PCP:  [name]  Primary Cardiologist:  Dr. Tonny Bollman  Primary Electrophysiologist:  None    History of Present Illness: Mark French is a 66 y.o. male who presents for post hospital follow up.    He has a history of CAD, status post CABG in 1998, peripheral arterial disease, diabetes, hypertension, venous insufficiency, hyperlipidemia and prior upper GI bleed as well as a history of stroke at the age of 2 months.  He was admitted 1/31-2/8 with an inferior STEMI.  LHC 09/07/11: Severe  Three-vessel CAD, LIMA-LAD patent, SVG-RCA chronically occluded, SVG-circumflex chronically occluded, SVG-diagonal 95%.  PCI: Promus DES to the SVG-diagonal.  Procedure complicated by transient complete heart block requiring CPR for 20 seconds and temporary transvenous pacemaker.  Echocardiogram 09/07/11: Difficult study, inferior and posterior lateral wall hypokinesis, mild LVH, EF 45%, mild LAE.  The patient had evidence of volume overload.  He was diuresed.  He developed a urinary tract infection as well as urinary retention.  He was seen by urology placed on Flomax and had a Foley catheter placed.  He was discharged to skilled nursing.  Labs: Hemoglobin 13.4, sodium 128, potassium 3.9, creatinine 0.91, ALT 48, A1c 7.  He is staying at [facility] nursing home.  He will likely be discharged tomorrow.  He's had increased lower extremity edema.  This is a chronic problem.  He tells me that he had venous Dopplers performed this morning but ruled out DVT.  He was placed on Lovenox as prophylaxis.  He denies chest pain.  He has stable dyspnea with exertion.  He is probably class 2b-3.  He sleeps in a recliner and has done so for years.  He denies PND.  He denies syncope or near-syncope.    Past Medical History  Diagnosis Date    . CORONARY ARTERY DISEASE 03/11/2007    s/p CABG 1998;  admitted 08/2011 with an inferior STEMI.  LHC 09/07/11: Severe  Three-vessel CAD, LIMA-LAD patent, SVG-RCA chronically occluded, SVG-circumflex chronically occluded, SVG-diagonal 95%.  PCI: Promus DES to the SVG-diagonal.  Procedure complicated by transient complete heart block requiring CPR for 20 seconds and temporary transvenous pacemaker  . DIABETES MELLITUS, TYPE I 03/11/2007  . HYPERTENSION 03/11/2007  . BLINDNESS 05/21/2008  . Proliferative diabetic retinopathy 03/09/2009  . HYPERCHOLESTEROLEMIA 09/28/2008  . CERVICAL RADICULOPATHY, LEFT 09/23/2008  . ANEMIA-IRON DEFICIENCY 09/23/2008  . DEPRESSION 09/23/2008  . PERIPHERAL NEUROPATHY 09/23/2008  . Chronic systolic heart failure 09/23/2008    Echocardiogram 09/07/11: Difficult study, inferior and posterior lateral wall hypokinesis, mild LVH, EF 45%, mild LAE.   [name] RENAL INSUFFICIENCY 03/15/2010  . CEREBROVASCULAR ACCIDENT, HX OF 09/23/2008  . 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 Venous insufficiency of leg   . ED (erectile dysfunction)   . Charcot foot due to diabetes mellitus     chronic pain  . Hypogonadism male   . Duodenal ulcer 2012    multiple  . GI bleed   . Osteoarthritis   . Stroke     Current Outpatient Prescriptions  Medication Sig Dispense Refill  . aspirin 81 MG chewable tablet Chew 1 tablet (81 mg total) by mouth daily.      . carvedilol (COREG) 6.25 MG tablet Take 1 tablet (6.25 mg total)  by mouth 2 (two) times daily with a meal.  60 tablet  11  . clopidogrel (PLAVIX) 75 MG tablet Take 1 tablet (75 mg total) by mouth daily with breakfast.  30 tablet  11  . clotrimazole-betamethasone (LOTRISONE) cream Apply 1 application topically 2 (two) times daily as needed. For rash      . Docusate Sodium (STOOL SOFTENER) 100 MG capsule Take 100 mg by mouth daily as needed. constipation      . enoxaparin (LOVENOX) 100 MG/ML SOLN Inject into the skin every 12  (twelve) hours.      . furosemide (LASIX) 40 MG tablet Take 1 tablet (40 mg total) by mouth daily.  30 tablet  11  . gabapentin (NEURONTIN) 300 MG capsule Take 300 mg by mouth 2 (two) times daily.      [name] HYDROcodone-acetaminophen (NORCO) 10-325 MG per tablet Take 1 tablet by mouth every 4 (four) hours as needed. For pain  30 tablet  0  . insulin aspart (NOVOLOG) 100 UNIT/ML injection Inject 15 Units into the skin 3 (three) times daily with meals.  1 vial  1  . insulin NPH (HUMULIN N,NOVOLIN N) 100 UNIT/ML injection Inject 25 Units into the skin daily before breakfast.      . insulin regular (NOVOLIN R,HUMULIN R) 100 units/mL injection Inject 15 Units into the skin as needed. As needed ONLY for blood sugars >200      . Insulin Syringe-Needle U-100 (INSULIN SYRINGE .5CC/31GX5/16") 31G X 5/16" 0.5 ML MISC As directed      . losartan (COZAAR) 50 MG tablet Take 50 mg by mouth daily.      [name] omeprazole (PRILOSEC) 20 MG capsule Take 20 mg by mouth daily.      . rosuvastatin (CRESTOR) 20 MG tablet Take 20 mg by mouth daily.      [name] spironolactone (ALDACTONE) 25 MG tablet Take 0.5 tablets (12.5 mg total) by mouth daily.  21 tablet  11  . Tamsulosin HCl (FLOMAX) 0.4 MG CAPS Take 1 capsule (0.4 mg total) by mouth daily.  30 capsule  11  . temazepam (RESTORIL) 7.5 MG capsule Take 7.5 mg by mouth at bedtime as needed.        Allergies: Allergies  Allergen Reactions  . Contrast Media (Iodinated Diagnostic Agents) Nausea Only  . Sildenafil Nausea Only and Other (See Comments)    History  Substance Use Topics  . Smoking status: Never Smoker   . Smokeless tobacco: Never Used  . Alcohol Use: No     ROS:  Please see the history of present illness.   He has a mild nonproductive cough.  All other systems reviewed and negative.   PHYSICAL EXAM: VS:  BP 138/62  Pulse 79  Ht 5\' 9"  (1.753 m)  Wt 233 lb 1.9 oz (105.743 kg)  BMI 34.43 kg/m2 Well nourished, well developed, in no acute distress HEENT:  normal Neck: Questionable JVD at 90 Degrees Cardiac:  normal S1, S2; RRR; no murmur Lungs:  clear to auscultation bilaterally, no wheezing, rhonchi or rales Abd: soft, nontender Ext: tight 2+ bilateral edema with chronic stasis changes Skin: warm and dry Neuro:  CNs 2-12 intact, no focal abnormalities noted  EKG:  Sinus rhythm, heart rate 79, first degree AV block, PR interval 210 ms, right bundle branch block, no significant change when compared to prior tracings  ASSESSMENT AND PLAN:  1. CORONARY ARTERY DISEASE  Stable post MI.  We discussed the importance of remaining on Plavix and aspirin.  He is still residing in a skilled nursing facility.  Followup with Dr. Excell Seltzer in 4 weeks.   2. Chronic systolic heart failure secondary to ischemic cardiomyopathy with EF 45% He is still volume overloaded.  His lower extremity edema is made worse by his venous insufficiency.  I will adjust his Lasix to 40 mg twice daily for 3 days.  Then, he will remain on Lasix 60 mg a day.  Check a basic metabolic panel today and repeat this in one week.  Followup in 4 weeks.   3. Edema  He has chronic venous insufficiency.  As noted above, he had venous Dopplers performed today that were apparently negative for DVT per his report.  We discussed keeping his legs elevated.  He already has materials at home to wrap his legs to help with swelling.   4. HYPERTENSION  Controlled.  Continue current therapy.    5. HYPERCHOLESTEROLEMIA  Continue Crestor.   6. UTI (lower urinary tract infection)  Follow up with urology as directed.     SignedTereso Newcomer, PA-C  11:41 AM 10/13/2011

## 2011-10-16 ENCOUNTER — Telehealth: Payer: Self-pay | Admitting: *Deleted

## 2011-10-16 NOTE — Telephone Encounter (Signed)
lmom labs ok. Mark French  

## 2011-10-16 NOTE — Telephone Encounter (Signed)
Message copied by Tarri Fuller on Mon Oct 16, 2011  9:13 AM ------      Message from: [place], [place] T      Created: Sun Oct 15, 2011  8:44 PM       Please notify patient that the lab results are ok.      Tereso Newcomer, PA-C  8:44 PM 10/15/2011

## 2011-10-18 ENCOUNTER — Telehealth: Payer: Self-pay | Admitting: Cardiovascular Disease

## 2011-10-18 NOTE — Telephone Encounter (Signed)
Pt's wife marilyn, pt had blood work 3-8  It was normal , does he still need repeat on 3-15? pls call

## 2011-10-18 NOTE — Telephone Encounter (Signed)
I reviewed the pt's chart and the pt does need to have repeat lab work on 10/20/11.  This is to follow-up on increased dose of Furosemide.  No answer at work number.

## 2011-10-18 NOTE — Telephone Encounter (Signed)
I spoke with the pt's wife and made her aware that the pt does need to come into the office for labs on 10/20/11.

## 2011-10-20 ENCOUNTER — Other Ambulatory Visit: Payer: Medicare Other

## 2011-10-20 ENCOUNTER — Telehealth: Payer: Self-pay | Admitting: *Deleted

## 2011-10-20 ENCOUNTER — Other Ambulatory Visit (INDEPENDENT_AMBULATORY_CARE_PROVIDER_SITE_OTHER): Payer: Medicare Other

## 2011-10-20 DIAGNOSIS — I5022 Chronic systolic (congestive) heart failure: Secondary | ICD-10-CM

## 2011-10-20 DIAGNOSIS — I2510 Atherosclerotic heart disease of native coronary artery without angina pectoris: Secondary | ICD-10-CM

## 2011-10-20 DIAGNOSIS — I10 Essential (primary) hypertension: Secondary | ICD-10-CM

## 2011-10-20 LAB — BASIC METABOLIC PANEL
CO2: 31 mEq/L (ref 19–32)
Calcium: 8.9 mg/dL (ref 8.4–10.5)
Chloride: 98 mEq/L (ref 96–112)
Glucose, Bld: 153 mg/dL — ABNORMAL HIGH (ref 70–99)
Sodium: 136 mEq/L (ref 135–145)

## 2011-10-20 NOTE — Telephone Encounter (Signed)
Message copied by Tarri Fuller on Fri Oct 20, 2011  2:43 PM ------      Message from: [place], [place] T      Created: Fri Oct 20, 2011 11:25 AM       Please notify patient that the lab results are ok.      Tereso Newcomer, PA-C  11:25 AM 10/20/2011

## 2011-10-20 NOTE — Telephone Encounter (Signed)
pt notified of lab results today. Mark French

## 2011-10-23 ENCOUNTER — Telehealth: Payer: Self-pay

## 2011-10-23 NOTE — Telephone Encounter (Signed)
RN called to inform MD that pt has refused OT, PT and HH services.

## 2011-11-08 ENCOUNTER — Ambulatory Visit (INDEPENDENT_AMBULATORY_CARE_PROVIDER_SITE_OTHER): Payer: Medicare Other | Admitting: Cardiovascular Disease

## 2011-11-08 ENCOUNTER — Encounter: Payer: Self-pay | Admitting: Cardiovascular Disease

## 2011-11-08 VITALS — BP 120/60 | HR 60 | Ht 69.0 in | Wt 227.8 lb

## 2011-11-08 DIAGNOSIS — I25810 Atherosclerosis of coronary artery bypass graft(s) without angina pectoris: Secondary | ICD-10-CM

## 2011-11-08 DIAGNOSIS — I5022 Chronic systolic (congestive) heart failure: Secondary | ICD-10-CM

## 2011-11-08 DIAGNOSIS — E7800 Pure hypercholesterolemia, unspecified: Secondary | ICD-10-CM

## 2011-11-08 DIAGNOSIS — I5023 Acute on chronic systolic (congestive) heart failure: Secondary | ICD-10-CM

## 2011-11-08 MED ORDER — FUROSEMIDE 80 MG PO TABS: 80.0000 mg | ORAL_TABLET | Freq: Every day | ORAL | Status: DC

## 2011-11-08 NOTE — Progress Notes (Signed)
HPI:  This 66 year old gentleman presenting for followup evaluation. The patient has coronary artery disease status post coronary artery bypass in 1998. He has multiple medical problems including peripheral arterial disease, diabetes, hypertension, history of stroke, and obesity. He uses a motorized scooter for mobility. The patient presented with an acute inferior wall infarction in January 2013. This was related to occlusion of one of his bypass grafts. He did undergo PCI of the diagonal branch vein graft. His post MI course was complicated by severe urinary retention and the need for a Foley catheter placement. He was in a nursing home for several weeks but has been discharged home.  The patient complains of bilateral leg swelling since he has left the skilled nursing facility. This has been progressive. He has not been elevating his legs like he previously was. He denies orthopnea, PND, or shortness of breath at rest. He's had no recent chest pains. He does have chronic dyspnea with exertion.  The patient has been compliant with his medications.  Outpatient Encounter Prescriptions as of 11/08/2011  Medication Sig Dispense Refill  . aspirin 81 MG chewable tablet Chew 1 tablet (81 mg total) by mouth daily.      . carvedilol (COREG) 6.25 MG tablet Take 1 tablet (6.25 mg total) by mouth 2 (two) times daily with a meal.  60 tablet  11  . clopidogrel (PLAVIX) 75 MG tablet Take 1 tablet (75 mg total) by mouth daily with breakfast.  30 tablet  11  . clotrimazole-betamethasone (LOTRISONE) cream Apply 1 application topically 2 (two) times daily as needed. For rash      . Docusate Sodium (STOOL SOFTENER) 100 MG capsule Take 100 mg by mouth daily as needed. constipation      . furosemide (LASIX) 40 MG tablet TAKE 40 MG TWICE DAILY FOR 3 DAYS ONLY THEN DECREASE LASIX TO 60 MG DAILY  90 tablet  11  . gabapentin (NEURONTIN) 300 MG capsule Take 300 mg by mouth 2 (two) times daily.      [name] HYDROcodone-acetaminophen  (NORCO) 10-325 MG per tablet Take 1 tablet by mouth every 4 (four) hours as needed. For pain  30 tablet  0  . insulin aspart (NOVOLOG) 100 UNIT/ML injection Inject 15 Units into the skin 3 (three) times daily with meals.  1 vial  1  . insulin NPH (HUMULIN N,NOVOLIN N) 100 UNIT/ML injection Inject 25 Units into the skin daily before breakfast.      . insulin regular (NOVOLIN R,HUMULIN R) 100 units/mL injection Inject 15 Units into the skin as needed. As needed ONLY for blood sugars >200      . Insulin Syringe-Needle U-100 (INSULIN SYRINGE .5CC/31GX5/16") 31G X 5/16" 0.5 ML MISC As directed      . losartan (COZAAR) 50 MG tablet Take 50 mg by mouth daily.      . nitroGLYCERIN (NITROSTAT) 0.4 MG SL tablet Place 1 tablet (0.4 mg total) under the tongue every 5 (five) minutes as needed for chest pain.  25 tablet  3  . omeprazole (PRILOSEC) 20 MG capsule Take 20 mg by mouth daily.      . rosuvastatin (CRESTOR) 20 MG tablet Take 20 mg by mouth daily.      [name] spironolactone (ALDACTONE) 25 MG tablet Take 0.5 tablets (12.5 mg total) by mouth daily.  21 tablet  11  . Tamsulosin HCl (FLOMAX) 0.4 MG CAPS Take 1 capsule (0.4 mg total) by mouth daily.  30 capsule  11  . temazepam (RESTORIL)  7.5 MG capsule Take 7.5 mg by mouth at bedtime as needed.      [name] DISCONTD: enoxaparin (LOVENOX) 100 MG/ML SOLN Inject into the skin every 12 (twelve) hours.        Allergies  Allergen Reactions  . Contrast Media (Iodinated Diagnostic Agents) Nausea Only  . Sildenafil Nausea Only and Other (See Comments)    Past Medical History  Diagnosis Date  . CORONARY ARTERY DISEASE 03/11/2007    s/p CABG 1998;  admitted 08/2011 with an inferior STEMI.  LHC 09/07/11: Severe  Three-vessel CAD, LIMA-LAD patent, SVG-RCA chronically occluded, SVG-circumflex chronically occluded, SVG-diagonal 95%.  PCI: Promus DES to the SVG-diagonal.  Procedure complicated by transient complete heart block requiring CPR for 20 seconds and temporary transvenous  pacemaker  . DIABETES MELLITUS, TYPE I 03/11/2007  . HYPERTENSION 03/11/2007  . BLINDNESS 05/21/2008  . Proliferative diabetic retinopathy 03/09/2009  . HYPERCHOLESTEROLEMIA 09/28/2008  . CERVICAL RADICULOPATHY, LEFT 09/23/2008  . ANEMIA-IRON DEFICIENCY 09/23/2008  . DEPRESSION 09/23/2008  . PERIPHERAL NEUROPATHY 09/23/2008  . Chronic systolic heart failure 09/23/2008    Echocardiogram 09/07/11: Difficult study, inferior and posterior lateral wall hypokinesis, mild LVH, EF 45%, mild LAE.   [name] RENAL INSUFFICIENCY 03/15/2010  . CEREBROVASCULAR ACCIDENT, HX OF 09/23/2008  . 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 Venous insufficiency of leg   . ED (erectile dysfunction)   . Charcot foot due to diabetes mellitus     chronic pain  . Hypogonadism male   . Duodenal ulcer 2012    multiple  . GI bleed   . Osteoarthritis   . Stroke     ROS: Negative except as per HPI  Ht 5\' 9"  (1.753 m)  Wt 103.329 kg (227 lb 12.8 oz)  BMI 33.64 kg/m2  PHYSICAL EXAM: Pt is alert and oriented, , obese, chronically ill-appearing male in NAD HEENT: normal Neck: JVP - normal, carotids 2+= without bruits Lungs: CTA bilaterally CV: RRR without murmur or gallop Abd: soft, NT, Positive BS Ext: 3+ bilateral pretibial edema Skin: Chronic stasis dermatitis and bilateral erythema in the legs  ASSESSMENT AND PLAN:

## 2011-11-08 NOTE — Patient Instructions (Signed)
Your physician has recommended you make the following change in your medication: INCREASE Furosemide to 80mg  daily  Your physician recommends that you schedule a follow-up appointment in: 2 MONTHS with Dr Excell Seltzer  Your physician recommends that you return for lab work in:  2 MONTHS (BMP)

## 2011-11-10 ENCOUNTER — Encounter: Payer: Self-pay | Admitting: Cardiovascular Disease

## 2011-11-10 NOTE — Assessment & Plan Note (Signed)
The patient has extensive native vessel and bypass graft disease. He's had a recent myocardial infarction. He underwent PCI of the vein graft to diagonal. He should be continued on aggressive medical therapy with aspirin Plavix, carvedilol, losartan, and Crestor. He also is on Aldactone. The patient has moderate LV dysfunction following his recent infarct. I would like to see him back in 2 months for followup.

## 2011-11-10 NOTE — Assessment & Plan Note (Signed)
The patient remains on statin therapy with Crestor. He should have lipids and LFTs drawn in a few months.

## 2011-11-10 NOTE — Assessment & Plan Note (Signed)
Appears stable. I am going to increase his furosemide to 80 mg daily. I counseled him about the importance of leg elevation. He is not capable of wearing compression stockings.

## 2011-11-14 ENCOUNTER — Ambulatory Visit (INDEPENDENT_AMBULATORY_CARE_PROVIDER_SITE_OTHER): Payer: Medicare Other | Admitting: Endocrinology

## 2011-11-14 ENCOUNTER — Encounter: Payer: Self-pay | Admitting: Endocrinology

## 2011-11-14 ENCOUNTER — Other Ambulatory Visit (INDEPENDENT_AMBULATORY_CARE_PROVIDER_SITE_OTHER): Payer: Medicare Other

## 2011-11-14 VITALS — BP 110/60 | HR 75 | Temp 98.3°F | Ht 67.0 in | Wt 243.8 lb

## 2011-11-14 DIAGNOSIS — N259 Disorder resulting from impaired renal tubular function, unspecified: Secondary | ICD-10-CM

## 2011-11-14 DIAGNOSIS — N058 Unspecified nephritic syndrome with other morphologic changes: Secondary | ICD-10-CM

## 2011-11-14 DIAGNOSIS — I5022 Chronic systolic (congestive) heart failure: Secondary | ICD-10-CM

## 2011-11-14 DIAGNOSIS — E1029 Type 1 diabetes mellitus with other diabetic kidney complication: Secondary | ICD-10-CM

## 2011-11-14 DIAGNOSIS — R0602 Shortness of breath: Secondary | ICD-10-CM

## 2011-11-14 DIAGNOSIS — E109 Type 1 diabetes mellitus without complications: Secondary | ICD-10-CM

## 2011-11-14 LAB — BASIC METABOLIC PANEL
CO2: 30 mEq/L (ref 19–32)
Chloride: 99 mEq/L (ref 96–112)
Glucose, Bld: 83 mg/dL (ref 70–99)
Potassium: 4.7 mEq/L (ref 3.5–5.1)
Sodium: 138 mEq/L (ref 135–145)

## 2011-11-14 NOTE — Progress Notes (Signed)
Subjective:    Patient ID: Mark French, male    DOB: 1946/05/29, 66 y.o.   MRN: [id number]  HPI Pt returns for f/u of insulin-requiring DM (1995).  He takes nph 20 units qam.  He frequently takes prn regular, at a widely varying dosage.  no cbg record, but states cbg's vary from 90-300.  There is no trend throughout the day.   He says edema has not improved since lasix was increased.   Renal insuff: denies sob.  Past Medical History  Diagnosis Date  . CORONARY ARTERY DISEASE 03/11/2007    s/p CABG 1998;  admitted 08/2011 with an inferior STEMI.  LHC 09/07/11: Severe  Three-vessel CAD, LIMA-LAD patent, SVG-RCA chronically occluded, SVG-circumflex chronically occluded, SVG-diagonal 95%.  PCI: Promus DES to the SVG-diagonal.  Procedure complicated by transient complete heart block requiring CPR for 20 seconds and temporary transvenous pacemaker  . DIABETES MELLITUS, TYPE I 03/11/2007  . HYPERTENSION 03/11/2007  . BLINDNESS 05/21/2008  . Proliferative diabetic retinopathy 03/09/2009  . HYPERCHOLESTEROLEMIA 09/28/2008  . CERVICAL RADICULOPATHY, LEFT 09/23/2008  . ANEMIA-IRON DEFICIENCY 09/23/2008  . DEPRESSION 09/23/2008  . PERIPHERAL NEUROPATHY 09/23/2008  . Chronic systolic heart failure 09/23/2008    Echocardiogram 09/07/11: Difficult study, inferior and posterior lateral wall hypokinesis, mild LVH, EF 45%, mild LAE.   [name] RENAL INSUFFICIENCY 03/15/2010  . CEREBROVASCULAR ACCIDENT, HX OF 09/23/2008  . 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 Venous insufficiency of leg   . ED (erectile dysfunction)   . Charcot foot due to diabetes mellitus     chronic pain  . Hypogonadism male   . Duodenal ulcer 2012    multiple  . GI bleed   . Osteoarthritis   . Stroke     Past Surgical History  Procedure Date  . Coronary artery bypass graft   . Carpal tunnel release     left  . Wrist surgery     s/p right-bone graft fusion  . Spine surgery 2010    C spine surgery for disc Dr. Yetta Barre    . Sigmoidoscopy 03/11/2001  . Venous doppler 01/30/2004  . Cataract surgery   . Ankle surgery     left  . Foot surgery     left    History   Social History  . Marital Status: Married    Spouse Name: N/A    Number of Children: 0  . Years of Education: N/A   Occupational History  . Disabled    Social History Main Topics  . Smoking status: Never Smoker   . Smokeless tobacco: Never Used  . Alcohol Use: No  . Drug Use: No  . Sexually Active: Not on file   Other Topics Concern  . Not on file   Social History Narrative   Comes to appointments with wife0 Caffeine drinks daily     Current Outpatient Prescriptions on File Prior to Visit  Medication Sig Dispense Refill  . aspirin 81 MG chewable tablet Chew 1 tablet (81 mg total) by mouth daily.      . carvedilol (COREG) 6.25 MG tablet Take 1 tablet (6.25 mg total) by mouth 2 (two) times daily with a meal.  60 tablet  11  . clopidogrel (PLAVIX) 75 MG tablet Take 1 tablet (75 mg total) by mouth daily with breakfast.  30 tablet  11  . clotrimazole-betamethasone (LOTRISONE) cream Apply 1 application topically 2 (two) times daily as needed. For rash      . Docusate Sodium (STOOL  SOFTENER) 100 MG capsule Take 100 mg by mouth daily as needed. constipation      . furosemide (LASIX) 80 MG tablet Take 1 tablet (80 mg total) by mouth daily.  30 tablet  11  . gabapentin (NEURONTIN) 300 MG capsule Take 300 mg by mouth 2 (two) times daily.      [name] HYDROcodone-acetaminophen (NORCO) 10-325 MG per tablet Take 1 tablet by mouth every 4 (four) hours as needed. For pain  30 tablet  0  . insulin aspart (NOVOLOG) 100 UNIT/ML injection Inject 15 Units into the skin 3 (three) times daily with meals.  1 vial  1  . insulin NPH (HUMULIN N,NOVOLIN N) 100 UNIT/ML injection Inject 30 Units into the skin daily before breakfast.       . Insulin Syringe-Needle U-100 (INSULIN SYRINGE .5CC/31GX5/16") 31G X 5/16" 0.5 ML MISC As directed      . losartan (COZAAR) 50  MG tablet Take 50 mg by mouth daily.      . nitroGLYCERIN (NITROSTAT) 0.4 MG SL tablet Place 1 tablet (0.4 mg total) under the tongue every 5 (five) minutes as needed for chest pain.  25 tablet  3  . omeprazole (PRILOSEC) 20 MG capsule Take 20 mg by mouth daily.      . rosuvastatin (CRESTOR) 20 MG tablet Take 20 mg by mouth daily.      [name] spironolactone (ALDACTONE) 25 MG tablet Take 0.5 tablets (12.5 mg total) by mouth daily.  21 tablet  11  . Tamsulosin HCl (FLOMAX) 0.4 MG CAPS Take 1 capsule (0.4 mg total) by mouth daily.  30 capsule  11  . DISCONTD: gabapentin (NEURONTIN) 300 MG capsule Take 1 capsule (300 mg total) by mouth at bedtime.  30 capsule  11    Allergies  Allergen Reactions  . Contrast Media (Iodinated Diagnostic Agents) Nausea Only  . Sildenafil Nausea Only and Other (See Comments)    Family History  Problem Relation Age of Onset  . Heart disease Mother     Coronary Artery Disease  . Heart disease Father     Coronary Artery Disease  . Heart disease Paternal Grandmother     Coronary Artery Disease  . Colon cancer Neg Hx   . Diabetes Other     Grandmother    BP 110/60  Pulse 75  Temp(Src) 98.3 F (36.8 C) (Oral)  Ht 5\' 7"  (1.702 m)  Wt 243 lb 12.8 oz (110.587 kg)  BMI 38.18 kg/m2  SpO2 96%  Review of Systems denies hypoglycemia and chest pain    Objective:   Physical Exam Pulses: dorsalis pedis intact bilat.   Feet: no deformity.  no ulcer on the feet.  feet are of normal color and temp.  2+ bilat leg edema.   There is bilteral onychomycosis.  There is an abrasion on the left anterior tibial area. Neuro: sensation is intact to touch on the feet, but decreased from normal.   Lab Results  Component Value Date   HGBA1C 7.0* 09/10/2011      Assessment & Plan:  DM is overcontrolled, given this regimen, which does match insulin to her changing needs throughout the day Edema, persistent Renal insuff.  This will have to be watched with increasing diuretic rx.

## 2011-11-14 NOTE — Patient Instructions (Addendum)
Please come back for a regular physical appointment in 3 months. Stop regular insulin. Take nph insulin, 30 units each morning.   blood tests are being requested for you today.  You will receive a letter with results.

## 2011-11-16 ENCOUNTER — Other Ambulatory Visit: Payer: Self-pay | Admitting: *Deleted

## 2011-11-16 ENCOUNTER — Encounter: Payer: Self-pay | Admitting: Endocrinology

## 2011-11-16 MED ORDER — CLOPIDOGREL BISULFATE 75 MG PO TABS: 75.0000 mg | ORAL_TABLET | Freq: Every day | ORAL | Status: DC

## 2011-11-16 MED ORDER — CARVEDILOL 6.25 MG PO TABS: 6.2500 mg | ORAL_TABLET | Freq: Two times a day (BID) | ORAL | Status: DC

## 2011-11-16 MED ORDER — ROSUVASTATIN CALCIUM 20 MG PO TABS: 20.0000 mg | ORAL_TABLET | Freq: Every day | ORAL | Status: DC

## 2011-11-16 NOTE — Telephone Encounter (Signed)
R'cd fax from [facility] for refill of Plavix, Carvedilol, and Crestor.

## 2011-11-17 ENCOUNTER — Telehealth: Payer: Self-pay

## 2011-11-17 NOTE — Telephone Encounter (Signed)
Ok to continue

## 2011-11-17 NOTE — Telephone Encounter (Signed)
Pt's spouse called stating that warnings in Plavix says that it should not be taken with ASA. Pt has been taking ASA 81 mg since hospital discharge in January and is requesting MD's advisement on if it is safe to continue, please advise.

## 2011-11-17 NOTE — Telephone Encounter (Signed)
Pt's spouse informed of MD's advisement.  

## 2011-11-19 ENCOUNTER — Telehealth: Payer: Self-pay | Admitting: Physician Assistant

## 2011-11-19 ENCOUNTER — Encounter (HOSPITAL_COMMUNITY): Payer: Self-pay | Admitting: *Deleted

## 2011-11-19 ENCOUNTER — Inpatient Hospital Stay (HOSPITAL_COMMUNITY)
Admission: EM | Admit: 2011-11-19 | Discharge: 2011-11-22 | DRG: 291 | Disposition: A | Discharge status: Home / Self Care | Payer: Medicare Other | Source: Ambulatory Visit | Attending: Internal Medicine | Admitting: Internal Medicine

## 2011-11-19 ENCOUNTER — Emergency Department (HOSPITAL_COMMUNITY): Payer: Medicare Other

## 2011-11-19 DIAGNOSIS — M1990 Unspecified osteoarthritis, unspecified site: Secondary | ICD-10-CM | POA: Diagnosis present

## 2011-11-19 DIAGNOSIS — I5023 Acute on chronic systolic (congestive) heart failure: Principal | ICD-10-CM

## 2011-11-19 DIAGNOSIS — Z951 Presence of aortocoronary bypass graft: Secondary | ICD-10-CM

## 2011-11-19 DIAGNOSIS — Z6835 Body mass index (BMI) 35.0-35.9, adult: Secondary | ICD-10-CM

## 2011-11-19 DIAGNOSIS — I517 Cardiomegaly: Secondary | ICD-10-CM | POA: Diagnosis present

## 2011-11-19 DIAGNOSIS — I739 Peripheral vascular disease, unspecified: Secondary | ICD-10-CM | POA: Insufficient documentation

## 2011-11-19 DIAGNOSIS — N183 Chronic kidney disease, stage 3 unspecified: Secondary | ICD-10-CM | POA: Diagnosis present

## 2011-11-19 DIAGNOSIS — I129 Hypertensive chronic kidney disease with stage 1 through stage 4 chronic kidney disease, or unspecified chronic kidney disease: Secondary | ICD-10-CM | POA: Diagnosis present

## 2011-11-19 DIAGNOSIS — N259 Disorder resulting from impaired renal tubular function, unspecified: Secondary | ICD-10-CM | POA: Insufficient documentation

## 2011-11-19 DIAGNOSIS — L98499 Non-pressure chronic ulcer of skin of other sites with unspecified severity: Secondary | ICD-10-CM | POA: Diagnosis present

## 2011-11-19 DIAGNOSIS — Z91041 Radiographic dye allergy status: Secondary | ICD-10-CM

## 2011-11-19 DIAGNOSIS — G609 Hereditary and idiopathic neuropathy, unspecified: Secondary | ICD-10-CM | POA: Diagnosis present

## 2011-11-19 DIAGNOSIS — E1139 Type 2 diabetes mellitus with other diabetic ophthalmic complication: Secondary | ICD-10-CM | POA: Diagnosis present

## 2011-11-19 DIAGNOSIS — I509 Heart failure, unspecified: Secondary | ICD-10-CM | POA: Diagnosis present

## 2011-11-19 DIAGNOSIS — E11319 Type 2 diabetes mellitus with unspecified diabetic retinopathy without macular edema: Secondary | ICD-10-CM | POA: Diagnosis present

## 2011-11-19 DIAGNOSIS — Z7982 Long term (current) use of aspirin: Secondary | ICD-10-CM

## 2011-11-19 DIAGNOSIS — I2589 Other forms of chronic ischemic heart disease: Secondary | ICD-10-CM | POA: Diagnosis present

## 2011-11-19 DIAGNOSIS — L97909 Non-pressure chronic ulcer of unspecified part of unspecified lower leg with unspecified severity: Secondary | ICD-10-CM

## 2011-11-19 DIAGNOSIS — I25810 Atherosclerosis of coronary artery bypass graft(s) without angina pectoris: Secondary | ICD-10-CM

## 2011-11-19 DIAGNOSIS — I2510 Atherosclerotic heart disease of native coronary artery without angina pectoris: Secondary | ICD-10-CM

## 2011-11-19 DIAGNOSIS — F3289 Other specified depressive episodes: Secondary | ICD-10-CM | POA: Diagnosis present

## 2011-11-19 DIAGNOSIS — N179 Acute kidney failure, unspecified: Secondary | ICD-10-CM | POA: Diagnosis not present

## 2011-11-19 DIAGNOSIS — J9600 Acute respiratory failure, unspecified whether with hypoxia or hypercapnia: Secondary | ICD-10-CM | POA: Diagnosis present

## 2011-11-19 DIAGNOSIS — Z794 Long term (current) use of insulin: Secondary | ICD-10-CM

## 2011-11-19 DIAGNOSIS — E669 Obesity, unspecified: Secondary | ICD-10-CM | POA: Diagnosis present

## 2011-11-19 DIAGNOSIS — E119 Type 2 diabetes mellitus without complications: Secondary | ICD-10-CM

## 2011-11-19 DIAGNOSIS — Z79899 Other long term (current) drug therapy: Secondary | ICD-10-CM

## 2011-11-19 DIAGNOSIS — Z888 Allergy status to other drugs, medicaments and biological substances status: Secondary | ICD-10-CM

## 2011-11-19 DIAGNOSIS — F329 Major depressive disorder, single episode, unspecified: Secondary | ICD-10-CM | POA: Diagnosis present

## 2011-11-19 DIAGNOSIS — E785 Hyperlipidemia, unspecified: Secondary | ICD-10-CM | POA: Diagnosis present

## 2011-11-19 DIAGNOSIS — I440 Atrioventricular block, first degree: Secondary | ICD-10-CM | POA: Diagnosis present

## 2011-11-19 DIAGNOSIS — Z7902 Long term (current) use of antithrombotics/antiplatelets: Secondary | ICD-10-CM

## 2011-11-19 DIAGNOSIS — Z8673 Personal history of transient ischemic attack (TIA), and cerebral infarction without residual deficits: Secondary | ICD-10-CM

## 2011-11-19 DIAGNOSIS — I4510 Unspecified right bundle-branch block: Secondary | ICD-10-CM | POA: Diagnosis present

## 2011-11-19 HISTORY — DX: Obesity, unspecified: E66.9

## 2011-11-19 LAB — PRO B NATRIURETIC PEPTIDE: Pro B Natriuretic peptide (BNP): 1150 pg/mL — ABNORMAL HIGH (ref 0–125)

## 2011-11-19 LAB — CBC
HCT: 37.1 % — ABNORMAL LOW (ref 39.0–52.0)
Hemoglobin: 11.8 g/dL — ABNORMAL LOW (ref 13.0–17.0)
MCH: 27.5 pg (ref 26.0–34.0)
MCV: 87.3 fL (ref 78.0–100.0)
Platelets: 167 10*3/uL (ref 150–400)
RBC: 4.25 MIL/uL (ref 4.22–5.81)
RDW: 14.5 % (ref 11.5–15.5)
WBC: 6.5 10*3/uL (ref 4.0–10.5)

## 2011-11-19 LAB — COMPREHENSIVE METABOLIC PANEL
Albumin: 3.6 g/dL (ref 3.5–5.2)
Alkaline Phosphatase: 68 U/L (ref 39–117)
BUN: 20 mg/dL (ref 6–23)
Chloride: 96 mEq/L (ref 96–112)
Glucose, Bld: 191 mg/dL — ABNORMAL HIGH (ref 70–99)
Potassium: 4.5 mEq/L (ref 3.5–5.1)
Total Bilirubin: 0.4 mg/dL (ref 0.3–1.2)

## 2011-11-19 LAB — POCT I-STAT TROPONIN I: Troponin i, poc: 0.03 ng/mL (ref 0.00–0.08)

## 2011-11-19 LAB — GLUCOSE, CAPILLARY: Glucose-Capillary: 133 mg/dL — ABNORMAL HIGH (ref 70–99)

## 2011-11-19 MED ORDER — TAMSULOSIN HCL 0.4 MG PO CAPS
0.4000 mg | ORAL_CAPSULE | Freq: Every day | ORAL | Status: DC
  Administered 2011-11-20 – 2011-11-22 (×3): 0.4 mg via ORAL
  Filled 2011-11-19 (×3): qty 1

## 2011-11-19 MED ORDER — CLOPIDOGREL BISULFATE 75 MG PO TABS
75.0000 mg | ORAL_TABLET | Freq: Every day | ORAL | Status: DC
  Administered 2011-11-20 – 2011-11-22 (×3): 75 mg via ORAL
  Filled 2011-11-19 (×4): qty 1

## 2011-11-19 MED ORDER — LOSARTAN POTASSIUM 50 MG PO TABS: 50.0000 mg | ORAL_TABLET | Freq: Every day | ORAL | Status: DC

## 2011-11-19 MED ORDER — HYDROCODONE-ACETAMINOPHEN 10-325 MG PO TABS: 1.0000 | ORAL_TABLET | ORAL | Status: DC | PRN

## 2011-11-19 MED ORDER — SODIUM CHLORIDE 0.9 % IJ SOLN: 3.0000 mL | INTRAMUSCULAR | Status: DC | PRN

## 2011-11-19 MED ORDER — ONDANSETRON HCL 4 MG/2ML IJ SOLN
4.0000 mg | INTRAMUSCULAR | Status: DC | PRN
  Administered 2011-11-19: 4 mg via INTRAVENOUS
  Filled 2011-11-19: qty 2

## 2011-11-19 MED ORDER — MORPHINE SULFATE 4 MG/ML IJ SOLN
4.0000 mg | Freq: Once | INTRAMUSCULAR | Status: AC
  Administered 2011-11-19: 4 mg via INTRAVENOUS
  Filled 2011-11-19: qty 1

## 2011-11-19 MED ORDER — CLOPIDOGREL BISULFATE 75 MG PO TABS: 75.0000 mg | ORAL_TABLET | Freq: Every day | ORAL | Status: DC

## 2011-11-19 MED ORDER — INSULIN NPH (HUMAN) (ISOPHANE) 100 UNIT/ML ~~LOC~~ SUSP
30.0000 [IU] | Freq: Every day | SUBCUTANEOUS | Status: DC
  Administered 2011-11-20 – 2011-11-22 (×3): 30 [IU] via SUBCUTANEOUS
  Filled 2011-11-19: qty 10

## 2011-11-19 MED ORDER — SPIRONOLACTONE 12.5 MG HALF TABLET
12.5000 mg | ORAL_TABLET | Freq: Every day | ORAL | Status: DC
  Administered 2011-11-20 – 2011-11-22 (×3): 12.5 mg via ORAL
  Filled 2011-11-19 (×3): qty 1

## 2011-11-19 MED ORDER — POTASSIUM CHLORIDE CRYS ER 20 MEQ PO TBCR
20.0000 meq | EXTENDED_RELEASE_TABLET | Freq: Two times a day (BID) | ORAL | Status: DC
  Administered 2011-11-19 – 2011-11-22 (×7): 20 meq via ORAL
  Filled 2011-11-19 (×8): qty 1

## 2011-11-19 MED ORDER — ROSUVASTATIN CALCIUM 40 MG PO TABS
40.0000 mg | ORAL_TABLET | Freq: Every day | ORAL | Status: DC
  Administered 2011-11-19 – 2011-11-21 (×3): 40 mg via ORAL
  Filled 2011-11-19 (×4): qty 1

## 2011-11-19 MED ORDER — CARVEDILOL 6.25 MG PO TABS
6.2500 mg | ORAL_TABLET | Freq: Two times a day (BID) | ORAL | Status: DC
  Administered 2011-11-19 – 2011-11-22 (×6): 6.25 mg via ORAL
  Filled 2011-11-19 (×8): qty 1

## 2011-11-19 MED ORDER — CARVEDILOL 6.25 MG PO TABS: 6.2500 mg | ORAL_TABLET | Freq: Two times a day (BID) | ORAL | Status: DC

## 2011-11-19 MED ORDER — PANTOPRAZOLE SODIUM 40 MG PO TBEC
40.0000 mg | DELAYED_RELEASE_TABLET | Freq: Every day | ORAL | Status: DC
  Administered 2011-11-20 – 2011-11-22 (×3): 40 mg via ORAL
  Filled 2011-11-19 (×3): qty 1

## 2011-11-19 MED ORDER — NON FORMULARY: 40.0000 mg | Freq: Every day | Status: DC

## 2011-11-19 MED ORDER — ENOXAPARIN SODIUM 40 MG/0.4ML ~~LOC~~ SOLN
40.0000 mg | SUBCUTANEOUS | Status: DC
  Administered 2011-11-19 – 2011-11-21 (×3): 40 mg via SUBCUTANEOUS
  Filled 2011-11-19 (×4): qty 0.4

## 2011-11-19 MED ORDER — LOSARTAN POTASSIUM 50 MG PO TABS
50.0000 mg | ORAL_TABLET | Freq: Every day | ORAL | Status: DC
  Administered 2011-11-20 – 2011-11-22 (×3): 50 mg via ORAL
  Filled 2011-11-19 (×3): qty 1

## 2011-11-19 MED ORDER — TAMSULOSIN HCL 0.4 MG PO CAPS: 0.4000 mg | ORAL_CAPSULE | Freq: Every day | ORAL | Status: DC

## 2011-11-19 MED ORDER — HYDROCODONE-ACETAMINOPHEN 10-325 MG PO TABS
1.0000 | ORAL_TABLET | ORAL | Status: DC | PRN
  Administered 2011-11-19 – 2011-11-22 (×8): 1 via ORAL
  Filled 2011-11-19 (×8): qty 1

## 2011-11-19 MED ORDER — FUROSEMIDE 10 MG/ML IJ SOLN
80.0000 mg | Freq: Two times a day (BID) | INTRAMUSCULAR | Status: DC
  Administered 2011-11-19: 80 mg via INTRAVENOUS
  Filled 2011-11-19 (×2): qty 8

## 2011-11-19 MED ORDER — ASPIRIN 81 MG PO CHEW
81.0000 mg | CHEWABLE_TABLET | Freq: Every day | ORAL | Status: DC
  Administered 2011-11-20 – 2011-11-22 (×3): 81 mg via ORAL
  Filled 2011-11-19 (×4): qty 1

## 2011-11-19 MED ORDER — INSULIN ASPART 100 UNIT/ML ~~LOC~~ SOLN: 0.0000 [IU] | Freq: Every day | SUBCUTANEOUS | Status: DC

## 2011-11-19 MED ORDER — ASPIRIN 81 MG PO CHEW
324.0000 mg | CHEWABLE_TABLET | Freq: Once | ORAL | Status: AC
  Administered 2011-11-19: 324 mg via ORAL
  Filled 2011-11-19: qty 4

## 2011-11-19 MED ORDER — ASPIRIN 81 MG PO CHEW: 81.0000 mg | CHEWABLE_TABLET | Freq: Every day | ORAL | Status: DC

## 2011-11-19 MED ORDER — ONDANSETRON HCL 4 MG/2ML IJ SOLN: 4.0000 mg | Freq: Four times a day (QID) | INTRAMUSCULAR | Status: DC | PRN

## 2011-11-19 MED ORDER — DOCUSATE SODIUM 100 MG PO TABS: 100.0000 mg | ORAL_TABLET | Freq: Two times a day (BID) | ORAL | Status: DC

## 2011-11-19 MED ORDER — INSULIN ASPART 100 UNIT/ML ~~LOC~~ SOLN
15.0000 [IU] | Freq: Three times a day (TID) | SUBCUTANEOUS | Status: DC
  Administered 2011-11-20 – 2011-11-22 (×8): 15 [IU] via SUBCUTANEOUS

## 2011-11-19 MED ORDER — FUROSEMIDE 80 MG PO TABS: 80.0000 mg | ORAL_TABLET | Freq: Every day | ORAL | Status: DC

## 2011-11-19 MED ORDER — SODIUM CHLORIDE 0.9 % IJ SOLN
3.0000 mL | Freq: Two times a day (BID) | INTRAMUSCULAR | Status: DC
  Administered 2011-11-19: 15:00:00 via INTRAVENOUS
  Administered 2011-11-19 – 2011-11-22 (×6): 3 mL via INTRAVENOUS

## 2011-11-19 MED ORDER — CARVEDILOL 6.25 MG PO TABS
6.2500 mg | ORAL_TABLET | Freq: Two times a day (BID) | ORAL | Status: DC
  Filled 2011-11-19 (×2): qty 1

## 2011-11-19 MED ORDER — NITROGLYCERIN 0.4 MG SL SUBL: 0.4000 mg | SUBLINGUAL_TABLET | SUBLINGUAL | Status: DC | PRN

## 2011-11-19 MED ORDER — ACETAMINOPHEN 325 MG PO TABS: 650.0000 mg | ORAL_TABLET | ORAL | Status: DC | PRN

## 2011-11-19 MED ORDER — FUROSEMIDE 10 MG/ML IJ SOLN
80.0000 mg | Freq: Two times a day (BID) | INTRAMUSCULAR | Status: DC
Start: 2011-11-19 — End: 2011-11-22
  Administered 2011-11-19 – 2011-11-21 (×4): 80 mg via INTRAVENOUS
  Filled 2011-11-19 (×8): qty 8

## 2011-11-19 MED ORDER — SPIRONOLACTONE 12.5 MG HALF TABLET: 12.5000 mg | ORAL_TABLET | Freq: Every day | ORAL | Status: DC

## 2011-11-19 MED ORDER — DOCUSATE SODIUM 100 MG PO CAPS
100.0000 mg | ORAL_CAPSULE | Freq: Two times a day (BID) | ORAL | Status: DC
  Administered 2011-11-19 – 2011-11-22 (×6): 100 mg via ORAL
  Filled 2011-11-19 (×7): qty 1

## 2011-11-19 MED ORDER — INSULIN ASPART 100 UNIT/ML ~~LOC~~ SOLN: 15.0000 [IU] | Freq: Three times a day (TID) | SUBCUTANEOUS | Status: DC

## 2011-11-19 MED ORDER — INSULIN ASPART 100 UNIT/ML ~~LOC~~ SOLN
0.0000 [IU] | Freq: Three times a day (TID) | SUBCUTANEOUS | Status: DC
  Administered 2011-11-20 – 2011-11-22 (×7): 3 [IU] via SUBCUTANEOUS

## 2011-11-19 MED ORDER — CLOTRIMAZOLE 1 % EX CREA
TOPICAL_CREAM | Freq: Two times a day (BID) | CUTANEOUS | Status: DC
  Administered 2011-11-19 – 2011-11-21 (×4): via TOPICAL
  Filled 2011-11-19: qty 15

## 2011-11-19 MED ORDER — ASPIRIN EC 81 MG PO TBEC
81.0000 mg | DELAYED_RELEASE_TABLET | Freq: Every day | ORAL | Status: DC
  Filled 2011-11-19: qty 1

## 2011-11-19 MED ORDER — GABAPENTIN 300 MG PO CAPS
300.0000 mg | ORAL_CAPSULE | Freq: Two times a day (BID) | ORAL | Status: DC
  Administered 2011-11-19 – 2011-11-22 (×6): 300 mg via ORAL
  Filled 2011-11-19 (×7): qty 1

## 2011-11-19 MED ORDER — SODIUM CHLORIDE 0.9 % IV SOLN: 250.0000 mL | INTRAVENOUS | Status: DC | PRN

## 2011-11-19 NOTE — Progress Notes (Signed)
11/19/11 1448  OTHER  CSW Follow Up Status Follow-up required     11/19/11 1448  OTHER  CSW Follow Up Status Follow-up required     Dionne Milo MSW [facility] Weekend/Social Worker [phone]

## 2011-11-19 NOTE — ED Notes (Signed)
Pt presents with pain for several days in left leg, hx of nerve problems. Pt has several day onset of sob and feeling uncomfortable in his chest but not necessarily a pain. Pt reports hx of stents in heart.

## 2011-11-19 NOTE — ED Provider Notes (Signed)
History     CSN: [id number]  Arrival date & time 11/19/11  1012   First MD Initiated Contact with Patient 11/19/11 1048      Chief Complaint  Patient presents with  . Shortness of Breath    (Consider location/radiation/quality/duration/timing/severity/associated sxs/prior treatment) HPI Comments: Patient with a history of CAD, type 1 diabetes, peripheral vascular disease, and chronic systolic heart failure presents emergency Department with a chief complaint of shortness of breath.  Onset of symptoms began 2 days ago and have gradually worsened.  Associated symptoms include worsening leg swelling x1 week but patient denies any chest pain, dyspnea on exertion, PND, chest palpitations, weakness, syncope, headache or change in vision.  Patient chronically suffers from orthopnea and sleeps in a recliner.  He reports that his insurance company called him to way in through the phone and that yesterday they stated that he was 8 pounds overweight.  Patient was recently evaluated in his primary care office on April 11 and was found to have a BNP of 279 at that time the patient was not symptomatic.  See below for recent interventions performed by cardiology.    [facility], Dr. Excell Seltzer:  CABG 1998;  admitted 09/07/2011 with an inferior STEMI. Severe  Three-vessel CAD, LIMA-LAD patent, SVG-RCA chronically occluded, SVG-circumflex chronically occluded, SVG-diagonal 95%.  PCI: Promus DES to the SVG-diagonal.  Procedure complicated by transient complete heart block requiring CPR for 20 seconds and temporary transvenous pacemaker. Echocardiogram 09/07/11: Difficult study, inferior and posterior lateral wall hypokinesis, mild LVH, EF 45%, mild LAE.   The history is provided by the patient.    Past Medical History  Diagnosis Date  . CORONARY ARTERY DISEASE 03/11/2007    s/p CABG 1998;  admitted 08/2011 with an inferior STEMI.  LHC 09/07/11: Severe  Three-vessel CAD, LIMA-LAD patent, SVG-RCA chronically  occluded, SVG-circumflex chronically occluded, SVG-diagonal 95%.  PCI: Promus DES to the SVG-diagonal.  Procedure complicated by transient complete heart block requiring CPR for 20 seconds and temporary transvenous pacemaker  . DIABETES MELLITUS, TYPE I 03/11/2007  . HYPERTENSION 03/11/2007  . BLINDNESS 05/21/2008  . Proliferative diabetic retinopathy 03/09/2009  . HYPERCHOLESTEROLEMIA 09/28/2008  . CERVICAL RADICULOPATHY, LEFT 09/23/2008  . ANEMIA-IRON DEFICIENCY 09/23/2008  . DEPRESSION 09/23/2008  . PERIPHERAL NEUROPATHY 09/23/2008  . Chronic systolic heart failure 09/23/2008    Echocardiogram 09/07/11: Difficult study, inferior and posterior lateral wall hypokinesis, mild LVH, EF 45%, mild LAE.   [name] RENAL INSUFFICIENCY 03/15/2010  . CEREBROVASCULAR ACCIDENT, HX OF 09/23/2008  . 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 Venous insufficiency of leg   . ED (erectile dysfunction)   . Charcot foot due to diabetes mellitus     chronic pain  . Hypogonadism male   . Duodenal ulcer 2012    multiple  . GI bleed   . Osteoarthritis   . Stroke     Past Surgical History  Procedure Date  . Coronary artery bypass graft   . Carpal tunnel release     left  . Wrist surgery     s/p right-bone graft fusion  . Spine surgery 2010    C spine surgery for disc Dr. Yetta Barre  . Sigmoidoscopy 03/11/2001  . Venous doppler 01/30/2004  . Cataract surgery   . Ankle surgery     left  . Foot surgery     left    Family History  Problem Relation Age of Onset  . Heart disease Mother     Coronary Artery Disease  .  Heart disease Father     Coronary Artery Disease  . Heart disease Paternal Grandmother     Coronary Artery Disease  . Colon cancer Neg Hx   . Diabetes Other     Grandmother    History  Substance Use Topics  . Smoking status: Never Smoker   . Smokeless tobacco: Never Used  . Alcohol Use: No      Review of Systems  Constitutional: Positive for activity change. Negative for  fever, chills and appetite change.  HENT: Negative for congestion.   Eyes: Negative for visual disturbance.  Respiratory: Positive for shortness of breath. Negative for cough, choking, chest tightness, wheezing and stridor.   Cardiovascular: Positive for leg swelling. Negative for chest pain and palpitations.  Gastrointestinal: Negative for abdominal pain.  Genitourinary: Negative for dysuria, urgency and frequency.  Neurological: Negative for dizziness, syncope, weakness, light-headedness, numbness and headaches.  Psychiatric/Behavioral: Negative for confusion.  All other systems reviewed and are negative.    Allergies  Contrast media and Sildenafil  Home Medications   Current Outpatient Rx  Name Route Sig Dispense Refill  . ASPIRIN 81 MG PO CHEW Oral Chew 81 mg by mouth daily.    [name] CARVEDILOL 6.25 MG PO TABS Oral Take 6.25 mg by mouth 2 (two) times daily with a meal.    . CLOPIDOGREL BISULFATE 75 MG PO TABS Oral Take 75 mg by mouth daily with breakfast.    . DOCUSATE SODIUM 100 MG PO TABS Oral Take 100 mg by mouth daily as needed. constipation    . FUROSEMIDE 80 MG PO TABS Oral Take 80 mg by mouth daily.    [name] GABAPENTIN 300 MG PO CAPS Oral Take 300 mg by mouth 2 (two) times daily.    [name] HYDROCODONE-ACETAMINOPHEN 10-325 MG PO TABS Oral Take 1-2 tablets by mouth every 4 (four) hours as needed. For pain    . INSULIN ASPART 100 UNIT/ML [place] SOLN Subcutaneous Inject 15 Units into the skin 3 (three) times daily with meals.    . INSULIN ISOPHANE HUMAN 100 UNIT/ML [place] SUSP Subcutaneous Inject 30 Units into the skin daily before breakfast.     . LOSARTAN POTASSIUM 50 MG PO TABS Oral Take 50 mg by mouth daily.    [name] OMEPRAZOLE 20 MG PO CPDR Oral Take 20 mg by mouth daily.    [name] ROSUVASTATIN CALCIUM 20 MG PO TABS Oral Take 20 mg by mouth daily.    [name] SPIRONOLACTONE 25 MG PO TABS Oral Take 12.5 mg by mouth daily.    [name] TAMSULOSIN HCL 0.4 MG PO CAPS Oral Take 0.4 mg by mouth daily.    [name]  CLOTRIMAZOLE-BETAMETHASONE 1-0.05 % EX CREA Topical Apply 1 application topically 2 (two) times daily as needed. For rash    . NITROGLYCERIN 0.4 MG SL SUBL Sublingual Place 0.4 mg under the tongue every 5 (five) minutes as needed. For chest pain      BP 122/58  Pulse 72  Temp(Src) 98 F (36.7 C) (Oral)  Resp 16  SpO2 100%  Physical Exam  Nursing note and vitals reviewed. Constitutional: He appears well-developed and well-nourished. No distress.  HENT:  Head: Normocephalic and atraumatic.  Eyes: Conjunctivae and EOM are normal. Pupils are equal, round, and reactive to light.  Neck: Normal range of motion. Neck supple. Normal carotid pulses and no JVD present. Carotid bruit is not present. No rigidity. Normal range of motion present.  Cardiovascular: Normal rate, regular rhythm, S1 normal, S2 normal, normal heart sounds,  intact distal pulses and normal pulses.  Exam reveals no gallop and no friction rub.   No murmur heard.      1+ pitting edema bilaterally, RRR, no aberrant sounds on auscultations, distal pulses intact, no carotid bruit or JVD.   Pulmonary/Chest: Effort normal and breath sounds normal. No accessory muscle usage or stridor. No respiratory distress. He exhibits no tenderness and no bony tenderness.       Mild crackles bilaterally at lung bases  Abdominal: Bowel sounds are normal.       Soft non tender. Non pulsatile aorta.   Skin: Skin is warm, dry and intact. No rash noted. He is not diaphoretic. No cyanosis. Nails show no clubbing.       Erythematous lower extremities bilaterally with bullae, no warmth to palpation, consistent with PVD    ED Course  Procedures (including critical care time)  Labs Reviewed  PRO B NATRIURETIC PEPTIDE - Abnormal; Notable for the following:    Pro B Natriuretic peptide (BNP) 1150.0 (*)    All other components within normal limits  CBC - Abnormal; Notable for the following:    Hemoglobin 11.8 (*)    HCT 37.1 (*)    All other  components within normal limits  COMPREHENSIVE METABOLIC PANEL - Abnormal; Notable for the following:    Sodium 134 (*)    Glucose, Bld 191 (*)    Creatinine, Ser 1.40 (*)    GFR calc non Af Amer 50 (*)    GFR calc Af Amer 58 (*)    All other components within normal limits  POCT I-STAT TROPONIN I   Dg Chest 2 View  11/19/2011  *RADIOLOGY REPORT*  Clinical Data: Short of breath, CABG  CHEST - 2 VIEW  Comparison: Chest radiograph 09/07/2011  Findings: Sternotomy wires overlie normal cardiac silhouette.  No evidence effusion, infiltrate, or pneumothorax.  There is mild obscuration of the left heart border.  Legrand Rams this to represent chronic atelectasis.  There is mild central venous congestion.  IMPRESSION: Cardiomegaly and mild central venous congestion.  Left lingular atelectasis versus less likely pneumonia.  Original Report Authenticated By: Genevive Bi, M.D.     No diagnosis found.  . Date: 11/19/2011  Rate: 77  Rhythm: normal sinus rhythm  QRS Axis: indeterminate  Intervals: PR prolonged  ST/T Wave abnormalities: nonspecific ST changes  Conduction Disutrbances:first-degree A-V block   Narrative Interpretation:   Old EKG Reviewed: changes noted    MDM  SOB  Consult: [facility] to see in the ED.  [facility] to admit patient          Jaci Carrel, Cordelia Poche 11/19/11 1336

## 2011-11-19 NOTE — Progress Notes (Signed)
11/19/11 1447  Discharge Planning  Type of Residence Private residence  Living Arrangements Spouse/significant other  Home Care Services No  Support Systems Spouse/significant other  Do you have any problems obtaining your medications? No  Once you are discharged, how will you get to your follow-up appointment? Self;Family  Expected Discharge Date 11/22/11  Social Work Consult Needed No    Dionne Milo MSW [facility] Emergency Dept. Weekend/Social Worker [phone]

## 2011-11-19 NOTE — H&P (Signed)
PCP: Everardo All Cardiologist: Excell Seltzer  HPI:  This 66 year old gentleman presenting for followup evaluation. The patient has coronary artery disease status post coronary artery bypass in 1998. He has multiple medical problems including peripheral arterial disease, diabetes, hypertension, history of stroke, obesity, CRI  and systolic HF with EF 45% by echo 1/13. He uses a motorized scooter for mobility. The patient presented with an acute inferior wall infarction in January 2013. This was related to occlusion of one of his bypass grafts. He did undergo PCI of the diagonal branch vein graft. His post MI course was complicated by severe urinary retention and the need for a Foley catheter placement. He was in a nursing home for several weeks but has been discharged home.  He saw Dr. Excell Seltzer about two weeks ago with worsening edema. Lasix increased to 80 daily. He comes in today c/o worsening shortness of breath since yesterday - now dyspneic at rest. He has a Engineer, site at home and he is up 8-10 pounds. He sleeps in a recliner chronically. +orthopnea.  His lower extremity edema is much worse. Wife wrapping LLE due to weeping blisters Baseline weight about 230. Denies CP. Compliant with meds. Mobility very limited.    Review of Systems:     Cardiac Review of Systems: {Y] = yes [ ]  = no  Chest Pain [    ]  Resting SOB Cove.Etienne   ] Exertional SOB  Cove.Etienne  ]  Orthopnea Cove.Etienne  ]   Pedal Edema [ y  ]    Palpitations [  ] Syncope  [  ]   Presyncope [   ]  General Review of Systems: [Y] = yes [  ]=no Constitional: recent weight change [  ]; anorexia [  ]; fatigue [  y]; nausea [  ]; night sweats [  ]; fever [  ]; or chills [  ];                                                                                                                                          Dental: poor dentition[  ];   Eye : blurred vision [  ]; diplopia [   ]; vision changes [  ];  Amaurosis fugax[  ]; Resp: cough [  ];  wheezing[  ];   hemoptysis[  ]; shortness of breath[y  ]; paroxysmal nocturnal dyspnea[  ]; dyspnea on exertion[y  ]; or orthopnea[y  ];  GI:  gallstones[  ], vomiting[  ];  dysphagia[  ]; melena[  ];  hematochezia [  ]; heartburn[  ];   Hx of  Colonoscopy[  ]; GU: kidney stones [  ]; hematuria[  ];   dysuria [  ];  nocturia[  ];  history of     obstruction [  ];  Skin: rash, swelling[  ];, hair loss[  ];  peripheral edema[y ];  or itching[  ]; Musculosketetal: myalgias[  ];  joint swelling[  ];  joint erythema[  ];  joint pain[y  ];  back pain[  ];  Heme/Lymph: bruising[  ];  bleeding[  ];  anemia[  ];  Neuro: TIA[  ];  headaches[  ];  stroke[  ];  vertigo[  ];  seizures[  ];   paresthesias[  ];  difficulty walking[y  ];  Endocrine: diabetes[y  ];  thyroid dysfunction[  ];  Other:  Past Medical History  Diagnosis Date  . CORONARY ARTERY DISEASE 03/11/2007    s/p CABG 1998;  admitted 08/2011 with an inferior STEMI.  LHC 09/07/11: Severe  Three-vessel CAD, LIMA-LAD patent, SVG-RCA chronically occluded, SVG-circumflex chronically occluded, SVG-diagonal 95%.  PCI: Promus DES to the SVG-diagonal.  Procedure complicated by transient complete heart block requiring CPR for 20 seconds and temporary transvenous pacemaker  . DIABETES MELLITUS, TYPE I 03/11/2007  . HYPERTENSION 03/11/2007  . BLINDNESS 05/21/2008  . Proliferative diabetic retinopathy 03/09/2009  . HYPERCHOLESTEROLEMIA 09/28/2008  . CERVICAL RADICULOPATHY, LEFT 09/23/2008  . ANEMIA-IRON DEFICIENCY 09/23/2008  . DEPRESSION 09/23/2008  . PERIPHERAL NEUROPATHY 09/23/2008  . Chronic systolic heart failure 09/23/2008    Echocardiogram 09/07/11: Difficult study, inferior and posterior lateral wall hypokinesis, mild LVH, EF 45%, mild LAE.   [name] RENAL INSUFFICIENCY 03/15/2010  . CEREBROVASCULAR ACCIDENT, HX OF 09/23/2008  . 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 Venous insufficiency of leg   . ED (erectile dysfunction)   . Charcot foot  due to diabetes mellitus     chronic pain  . Hypogonadism male   . Duodenal ulcer 2012    multiple  . GI bleed   . Osteoarthritis   . Stroke     Medications Prior to Admission  Medication Dose Route Frequency Provider Last Rate Last Dose  . aspirin chewable tablet 324 mg  324 mg Oral Once [employer], PA-C   324 mg at 11/19/11 1202  . morphine 4 MG/ML injection 4 mg  4 mg Intravenous Once [employer], PA-C   4 mg at 11/19/11 1236  . ondansetron (ZOFRAN) injection 4 mg  4 mg Intravenous PRN Lisette Paz, PA-C   4 mg at 11/19/11 1236   Medications Prior to Admission  Medication Sig Dispense Refill  . aspirin 81 MG chewable tablet Chew 81 mg by mouth daily.      . carvedilol (COREG) 6.25 MG tablet Take 6.25 mg by mouth 2 (two) times daily with a meal.      . clopidogrel (PLAVIX) 75 MG tablet Take 75 mg by mouth daily with breakfast.      . Docusate Sodium (STOOL SOFTENER) 100 MG capsule Take 100 mg by mouth daily as needed. constipation      . furosemide (LASIX) 80 MG tablet Take 80 mg by mouth daily.      [name] gabapentin (NEURONTIN) 300 MG capsule Take 300 mg by mouth 2 (two) times daily.      [name] HYDROcodone-acetaminophen (NORCO) 10-325 MG per tablet Take 1-2 tablets by mouth every 4 (four) hours as needed. For pain      . insulin aspart (NOVOLOG) 100 UNIT/ML injection Inject 15 Units into the skin 3 (three) times daily with meals.      . insulin NPH (HUMULIN N,NOVOLIN N) 100 UNIT/ML injection Inject 30 Units into the skin daily before breakfast.       .  losartan (COZAAR) 50 MG tablet Take 50 mg by mouth daily.      [name] omeprazole (PRILOSEC) 20 MG capsule Take 20 mg by mouth daily.      . rosuvastatin (CRESTOR) 20 MG tablet Take 20 mg by mouth daily.      [name] spironolactone (ALDACTONE) 25 MG tablet Take 12.5 mg by mouth daily.      . Tamsulosin HCl (FLOMAX) 0.4 MG CAPS Take 0.4 mg by mouth daily.      [name] DISCONTD: carvedilol (COREG) 6.25 MG tablet Take 1 tablet (6.25 mg total) by mouth 2 (two)  times daily with a meal.  60 tablet  5  . DISCONTD: insulin aspart (NOVOLOG) 100 UNIT/ML injection Inject 15 Units into the skin 3 (three) times daily with meals.  1 vial  1  . DISCONTD: rosuvastatin (CRESTOR) 20 MG tablet Take 1 tablet (20 mg total) by mouth daily.  30 tablet  5  . DISCONTD: spironolactone (ALDACTONE) 25 MG tablet Take 0.5 tablets (12.5 mg total) by mouth daily.  21 tablet  11  . clotrimazole-betamethasone (LOTRISONE) cream Apply 1 application topically 2 (two) times daily as needed. For rash      . nitroGLYCERIN (NITROSTAT) 0.4 MG SL tablet Place 0.4 mg under the tongue every 5 (five) minutes as needed. For chest pain      . DISCONTD: gabapentin (NEURONTIN) 300 MG capsule Take 1 capsule (300 mg total) by mouth at bedtime.  30 capsule  11  . DISCONTD: nitroGLYCERIN (NITROSTAT) 0.4 MG SL tablet Place 1 tablet (0.4 mg total) under the tongue every 5 (five) minutes as needed for chest pain.  25 tablet  3     Allergies  Allergen Reactions  . Contrast Media (Iodinated Diagnostic Agents) Nausea Only  . Sildenafil Nausea Only and Other (See Comments)    History   Social History  . Marital Status: Married    Spouse Name: N/A    Number of Children: 0  . Years of Education: N/A   Occupational History  . Disabled    Social History Main Topics  . Smoking status: Never Smoker   . Smokeless tobacco: Never Used  . Alcohol Use: No  . Drug Use: No  . Sexually Active: Not on file   Other Topics Concern  . Not on file   Social History Narrative   Comes to appointments with wife0 Caffeine drinks daily     Family History  Problem Relation Age of Onset  . Heart disease Mother     Coronary Artery Disease  . Heart disease Father     Coronary Artery Disease  . Heart disease Paternal Grandmother     Coronary Artery Disease  . Colon cancer Neg Hx   . Diabetes Other     Grandmother    PHYSICAL EXAM: Filed Vitals:   11/19/11 1226  BP: 122/58  Pulse: 72  Temp:   Resp:      Pt is alert and oriented, , obese, chronically ill-appearing male in NA. Mildly dyspneic HEENT: normal  Neck: JVP - hard to see. Appears up to jaw, carotids 2+ without bruits  Lungs: Crackles at bases CV: RRR without murmur or gallop  Abd: soft, NT, mild distended Positive BS  Ext: 2-3+ bilateral pretibial edema. Mild blistering with 2 open sores on LLE Skin: Chronic stasis dermatitis and bilateral erythema in the legs.    ECG: Pending  Results for orders placed during the hospital encounter of 11/19/11 (from the past 24 hour(s))  PRO B NATRIURETIC PEPTIDE     Status: Abnormal   Collection Time   11/19/11 10:58 AM      Component Value Range   Pro B Natriuretic peptide (BNP) 1150.0 (*) 0 - 125 (pg/mL)  CBC     Status: Abnormal   Collection Time   11/19/11 10:58 AM      Component Value Range   WBC 6.5  4.0 - 10.5 (K/uL)   RBC 4.25  4.22 - 5.81 (MIL/uL)   Hemoglobin 11.8 (*) 13.0 - 17.0 (g/dL)   HCT 16.1 (*) 09.6 - 52.0 (%)   MCV 87.3  78.0 - 100.0 (fL)   MCH 27.8  26.0 - 34.0 (pg)   MCHC 31.8  30.0 - 36.0 (g/dL)   RDW 04.5  40.9 - 81.1 (%)   Platelets 177  150 - 400 (K/uL)  COMPREHENSIVE METABOLIC PANEL     Status: Abnormal   Collection Time   11/19/11 10:58 AM      Component Value Range   Sodium 134 (*) 135 - 145 (mEq/L)   Potassium 4.5  3.5 - 5.1 (mEq/L)   Chloride 96  96 - 112 (mEq/L)   CO2 30  19 - 32 (mEq/L)   Glucose, Bld 191 (*) 70 - 99 (mg/dL)   BUN 20  6 - 23 (mg/dL)   Creatinine, Ser 9.14 (*) 0.50 - 1.35 (mg/dL)   Calcium 9.3  8.4 - 78.2 (mg/dL)   Total Protein 7.2  6.0 - 8.3 (g/dL)   Albumin 3.6  3.5 - 5.2 (g/dL)   AST 16  0 - 37 (U/L)   ALT 13  0 - 53 (U/L)   Alkaline Phosphatase 68  39 - 117 (U/L)   Total Bilirubin 0.4  0.3 - 1.2 (mg/dL)   GFR calc non Af Amer 50 (*) >90 (mL/min)   GFR calc Af Amer 58 (*) >90 (mL/min)  POCT I-STAT TROPONIN I     Status: Normal   Collection Time   11/19/11 11:10 AM      Component Value Range   Troponin i, poc 0.03   0.00 - 0.08 (ng/mL)   Comment 3            Dg Chest 2 View  11/19/2011  *RADIOLOGY REPORT*  Clinical Data: Short of breath, CABG  CHEST - 2 VIEW  Comparison: Chest radiograph 09/07/2011  Findings: Sternotomy wires overlie normal cardiac silhouette.  No evidence effusion, infiltrate, or pneumothorax.  There is mild obscuration of the left heart border.  Legrand Rams this to represent chronic atelectasis.  There is mild central venous congestion.  IMPRESSION: Cardiomegaly and mild central venous congestion.  Left lingular atelectasis versus less likely pneumonia.  Original Report Authenticated By: Genevive Bi, M.D.     ASSESSMENT: 1) A/C systolic CHF  2) ICM EF 45% 3) CAD s/p CABG with PCI SVG 4) Acute respiratory failure 5) DM2 6) CRI, stage III 7) Venous stasis ulcers   PLAN/DISCUSSION:  He has volume overload not responding to oral diuretics. Will admit for IV diuresis. No evidence ischemia. Will need continue HF education. Consult wound care for possible UNNA boots. Cover DM2 with SSI.   Rory Xiang,MD 1:31 PM

## 2011-11-19 NOTE — ED Notes (Signed)
Reports having sob x 18 hours, denies any cp. Denies cough.  Has cardiac hx, ekg done at triage, no resp distress noted, HR 78, spo2 100%, talking in full sentences.

## 2011-11-19 NOTE — Telephone Encounter (Signed)
Patient calls in with worsening shortness of breath since yesterday. He has a Engineer, site at home and he is up 8 pounds. He sleeps in a recliner chronically. His lower extremity edema is much worse. He's describing class 3-4 dyspnea. He notes PND. I've advised him and his wife to come to the emergency room at [facility] as soon as possible for further evaluation and treatment.  They agree with this plan. Tereso Newcomer, PA-C  9:39 AM 11/19/2011

## 2011-11-20 LAB — GLUCOSE, CAPILLARY
Glucose-Capillary: 147 mg/dL — ABNORMAL HIGH (ref 70–99)
Glucose-Capillary: 139 mg/dL — ABNORMAL HIGH (ref 70–99)

## 2011-11-20 LAB — BASIC METABOLIC PANEL
BUN: 23 mg/dL (ref 6–23)
Creatinine, Ser: 1.5 mg/dL — ABNORMAL HIGH (ref 0.50–1.35)
GFR calc Af Amer: 53 mL/min — ABNORMAL LOW (ref >90)
GFR calc non Af Amer: 46 mL/min — ABNORMAL LOW (ref >90)
Potassium: 4.5 mEq/L (ref 3.5–5.1)

## 2011-11-20 LAB — HEMOGLOBIN A1C: Hgb A1c MFr Bld: 6.7 % — ABNORMAL HIGH (ref <5.7)

## 2011-11-20 NOTE — Plan of Care (Signed)
Problem: Phase II Progression Outcomes Goal: Walk in hall or up in chair TID Walk to bathroom with supervision

## 2011-11-20 NOTE — Progress Notes (Addendum)
Patient Name: Mark French Date of Encounter: 11/20/2011  Active Problems: Acute on chronic systolic heart failure    SUBJECTIVE: Pt feels, "a whole lot better" since coming in.  He states he is nearly back to his baseline with breathing and edema.  He is concerned about his dressing on his LLE and wonders if someone is coming to see it.  Pt denies any dyspnea at rest, but is still experiencing some exertional dyspnea. Pt also denies any chest pain, tightness, pressure or diaphoresis.  OBJECTIVE  Filed Vitals:   11/19/11 1453 11/19/11 1700 11/20/11 0831 11/20/11 0846  BP: 114/42 99/45 90/36  107/34  Pulse: 61 60 62   Temp:      TempSrc:      Resp: 14 18 16    Height:  5\' 8"  (1.727 m)    Weight:  232 lb 9.6 oz (105.507 kg)    SpO2: 100% 100% 98%    Intake/Output Summary (Last 24 hours) at 11/20/11 1131 Last data filed at 11/20/11 1000  Gross per 24 hour  Intake    700 ml  Output   1700 ml  Net  -1000 ml   Weight change:  Wt Readings from Last 3 Encounters:  11/20/2011 229.4 lb  11/19/11 232 lb 9.6 oz (105.507 kg)  11/14/11 243 lb 12.8 oz (110.587 kg)  11/08/11 227 lb 12.8 oz (103.329 kg)   PHYSICAL EXAM  General: Well developed, well nourished, in no acute distress. Relaxed sitting up in chair. Head: Normocephalic, atraumatic. PERRLA.   Neck: Shaving nicks present,+ hepatojugular reflux with mild V wave. Neck veins relatively flat. Supple without bruits. Trachea midline. No lymphadenopathy or thyromegaly. Lungs:  Diminished with posterior bibasilar fine crackles. Resp regular and unlabored. Equal expansion bilaterally. Heart: S3 present. RRR, S1, S2, no S4, gallops or murmurs. Abdomen: Obese, soft, non-tender, non-distended, BS + x 4. LBM 4/14.  Extremities: Trace, nonpitting BLE edema, erythema and warmth. LLE with blisters and superficial venous stasis ulcers with purulent drainage. RUE less developed than LUE No clubbing, cyanosis.  Neuro: Decreased sensation in BLE  up to knees, L>R. Baseline poor mobility. Alert and oriented X 3. Moves all extremities spontaneously.  Psych: Normal affect. Normal tone and timbre of speech. Well groomed.   LABS:  CBC: Basename 11/19/11 1342 11/19/11 1058  WBC 6.3 6.5  NEUTROABS -- --  HGB 11.7* 11.8*  HCT 37.1* 37.1*  MCV 87.3 87.3  PLT 167 177   Basic Metabolic Panel: Basename 11/20/11 0550 11/19/11 1342 11/19/11 1058  NA 139 -- 134*  K 4.5 -- 4.5  CL 100 -- 96  CO2 28 -- 30  GLUCOSE 139* -- 191*  BUN 23 -- 20  CREATININE 1.50* 1.37* --  CALCIUM 9.2 -- 9.3  MG -- -- --  PHOS -- -- --   BNP: Pro B Natriuretic peptide (BNP)  Date/Time Value Range Status  11/19/2011 10:58 AM 1150.0* 0-125 (pg/mL) Final  11/14/2011  8:51 AM 279.0* 0.0-100.0 (pg/mL) Final   TELE:       No alarms or events overnight. SR, with RBBB, rate of 66.  ECG:  11/20/2011 Sinus rhythm with 1st degree A-V block Right bundle branch block Possible Lateral infarct , age undetermined Inferior infarct , age undetermined  Echo: 09/07/2011 Study Conclusions  - Left ventricle: Very poor image quality. EF hard to judge. Inferior and posterior lateral wall appear hypokinetic Wall thickness was increased in a pattern of mild LVH. The estimated ejection fraction was 45%. - Left  atrium: The atrium was mildly dilated. Transthoracic echocardiography. M-mode, complete 2D, spectral Doppler, and color Doppler. Height: Height: 172.7cm. Height: 68in. Weight: Weight: 106.1kg. Weight: 233.5lb. Body mass index: BMI: 35.6kg/m^2. Body surface area: BSA: 2.50m^2. Patient status: Inpatient. Location: ICU/CCU  Cath: 09/07/2011 HEMODYNAMICS: Aortic pressure was 196/93; LV pressure was 195/39; LVEDP 42. There was no gradient between the left ventricle and aorta. Of note, these pressure measurements were obtained after his episode of complete heart block and after he received CPR. The readings may have been somewhat affected by zeroing of the pressure  line.  ANGIOGRAPHIC DATA: The left main coronary artery is widely patent..  The left anterior descending artery is patent proximally. The mid-vessel is heavily diseased and occluded in the midportion. There is a small diagonal vessel which is patent. There is a large septal arcade that appears to feed collaterals to the distal right coronary artery circulation.  The left circumflex artery is is a medium-sized vessel. There is an ostial 25% stenosis. The mid circumflex is heavily diseased and essentially subtotally occluded. The OM1 is small.  The right coronary artery is a small vessel but appears dominant. It is heavily diseased throughout the proximal, mid and distal portions.  SVG to RCA was occluded proximally.  SVG to circumflex system was occluded proximally.  SVG to diagonal was small and had moderate disease in the mid torsion. There is a severe, 95% stenosis in the distal portion of the graft. There is TIMI-3 flow at the time of the diagnostic angiogram.  The LIMA to LAD is widely patent. The mid to distal LAD filled by the LIMA is small and diffusely diseased.  PCI NARRATIVE:  Angiomax was used for anticoagulation. ACT was used to confirm that was therapeutic. It was unclear which occluded graft was the cause of the patient's acute MI. He had ECG changes in the inferior, lateral and posterior distributions. His pain had been going on for over 24 hours. It was not continuous without. His longest stretch of pain was about 15 hours. Initial troponin was elevated at over 13.  An LCB guide was used to engage the SVG to circumflex. Several pro-water wire support attempted to cross this lesion without success. A fielder XT was also tried without success.  A JR 4 guide was then used to engage the SVG to RCA. A pro-water wire would not cross this lesion.  The LCB guide was then placed back into the SVG to diagonal that had a 95% stenosis. Proler wire was used to cross the lesion. A 2.0 x 12 many track  balloon was used to predilate the lesion in the distal graft. A 2.25 x 16 Promus elements stent was then placed across the heavily diseased area in the distal graft and deployed at 14 atmospheres for 33 seconds. There is an excellent angiographic result. TIMI-3 flow was maintained. I do not want to risk distal embolization and therefore did not post dilate the stent as it did appear oversized for the small graft. The patient's pain improved significantly. There was some distal spasm likely from a wire. Intracoronary nitroglycerin was administered with some resolution of this vessel spasm. There is no residual stenosis. Lesion length was 12 mm.  LEFT VENTRICULOGRAM: Left ventricular angiogram was not done due to elevated LVEDP. LVEDP was 46 mm Hg. as we advanced the pigtail catheter into the left ventricle, the patient had an episode of complete heart block. He did lose consciousness. He required CPR for about 15 seconds. His rhythm  spontaneously came back. We placed a temporary pacemaker at that point through a 6 French right femoral venous sheath.  IMPRESSIONS:  1. Severe three-vessel native coronary artery disease. Patent LIMA to LAD. Chronically occluded SVG to RCA. Chronically occluded SVG to circumflex. 2. 95% stenosis in the distal SVG to diagonal graft. This was successfully stented with a 2.25 x 16 drug-eluting stent.  3. transient complete heart block requiring CPR for about 20 seconds after revascularization. Temporary transvenous pacemaker placed after this episode.  RECOMMENDATION: The patient will be watched in the CCU. Continue Angiomax for 2 hours at the lower dose since he was not able to swallow Plavix while on the table. He has been given a 600 mg of Plavix. He'll need to continue dual antiplatelet therapy for at least a year. Continue aggressive secondary prevention. Will hold off on beta blocker now until the patient's rhythm stabilizes. He will need statin. Continue  ARB.  Radiology/Studies: Dg Chest 2 View  11/19/2011  *RADIOLOGY REPORT*  Clinical Data: Short of breath, CABG  CHEST - 2 VIEW  Comparison: Chest radiograph 09/07/2011  Findings: Sternotomy wires overlie normal cardiac silhouette.  No evidence effusion, infiltrate, or pneumothorax.  There is mild obscuration of the left heart border.  Legrand Rams this to represent chronic atelectasis.  There is mild central venous congestion.  IMPRESSION: Cardiomegaly and mild central venous congestion.  Left lingular atelectasis versus less likely pneumonia.  Original Report Authenticated By: Genevive Bi, M.D.   Current Medications:    . aspirin  324 mg Oral Once  . aspirin  81 mg Oral Daily  . carvedilol  6.25 mg Oral BID WC  . clopidogrel  75 mg Oral Q breakfast  . clotrimazole   Topical BID  . docusate sodium  100 mg Oral BID  . enoxaparin  40 mg Subcutaneous Q24H  . furosemide  80 mg Intravenous BID  . gabapentin  300 mg Oral BID  . insulin aspart  0-20 Units Subcutaneous TID WC  . insulin aspart  0-5 Units Subcutaneous QHS  . insulin aspart  15 Units Subcutaneous TID WC  . insulin NPH  30 Units Subcutaneous QAC breakfast  . losartan  50 mg Oral Daily  .  morphine injection  4 mg Intravenous Once  . pantoprazole  40 mg Oral Q1200  . potassium chloride  20 mEq Oral BID  . rosuvastatin  40 mg Oral q1800  . sodium chloride  3 mL Intravenous Q12H  . spironolactone  12.5 mg Oral Daily  . Tamsulosin HCl  0.4 mg Oral Daily   ASSESSMENT AND PLAN  1. A/C systolic CHF-Stabilizing. Baseline wt is 230lbs. Today it is 104.2kg (229.24lbs). Nearly at baseline with edema and dyspnea. Anticipate discharge soon. Cont diuresis with Lasix 80mg  BID. Check BMP in am. Strict I/O. Daily wt.  2. ICM EF 45%-Stable. Cont daily ASA, Coreg, Plavix and Aldactone. 3. CAD s/p CABG with PCI SVG-Stable. Cont daily ASA, Coreg, Plavix and Aldactone. 4. Acute respiratory failure-Resolved. Pt nearly at baseline with exertional  dyspnea. He is on RA and not requiring supplemental O2. 5. DM2-Stable. Cont NPH and SSI with fingersticks AC/HS. 6. CRI, stage III-Stable. Recheck renal function in am with continued diuresis.  7. Venous stasis ulcers-Skin care consult, may need antibiotics.  Signed,  Dominica Severin, RN, ACNP student Theodore Demark , PA-C 11:31 AM 11/20/2011  Patient seen, examined. Available data reviewed. Agree with findings, assessment, and plan as outlined by Theodore Demark, PA-C. Pt well-known to me. He  is improved from admission and making good progress with IV diuresis. Cont same Rx and recheck in AM. Will probably have to convert back to PO lasix in the AM with creatinine starting to increase. Renal function is too tenuous to introduce ACE right now.  Tonny Bollman, M.D. 11/20/2011 7:15 PM

## 2011-11-20 NOTE — Consult Note (Signed)
WOC consult Note Reason for Consult: eval wounds of LE, possible need for compression.  Reviewed records, pt had ABI's performed 04/19/11 R 0.72 L 0.71 demonstrates PAD and best practice with these results would be to not use any type of compression wraps.  Will need to treat wounds topically and if pt is a candidate for any surgical intervention from a vascular standpoint could be done on the outpatient basis.  Believe that pt does have mixed etiology as he has hemosiderin staining and swelling and does have faint palpable pulses. Wound type: venous ulceration and blisters of the RLE, one appears to be ruptured, unroofed blister of the pretibial area. Posterior achilles area with more of a substantial ulceration Measurement: pretibial R 1.5cm x 2.0cm x 0.2cm, posterior right-2.5cm x 2.0cm x0.2cm Wound bed: Posterior wound-yellow 100 % slough, pretibial pink and moist Drainage (amount, consistency, odor) serous drainage from both, moderate amounts, no odor Periwound: intact  Dressing procedure/placement/frequency: will order silicone foam dressings to both ulcers to manage exudate.  Would recommend follow up ABIs and vascular consult if they are similar or worsening.  Discussed POC with patient.  Re consult if needed, will not follow at this time. Thanks  Dione Mccombie [employer], CWOCN [phone])

## 2011-11-21 LAB — GLUCOSE, CAPILLARY
Glucose-Capillary: 143 mg/dL — ABNORMAL HIGH (ref 70–99)
Glucose-Capillary: 114 mg/dL — ABNORMAL HIGH (ref 70–99)
Glucose-Capillary: 138 mg/dL — ABNORMAL HIGH (ref 70–99)
Glucose-Capillary: 135 mg/dL — ABNORMAL HIGH (ref 70–99)

## 2011-11-21 LAB — BASIC METABOLIC PANEL
BUN: 27 mg/dL — ABNORMAL HIGH (ref 6–23)
Calcium: 9.1 mg/dL (ref 8.4–10.5)
GFR calc non Af Amer: 41 mL/min — ABNORMAL LOW (ref >90)
Glucose, Bld: 130 mg/dL — ABNORMAL HIGH (ref 70–99)

## 2011-11-21 MED ORDER — MORPHINE SULFATE 2 MG/ML IJ SOLN
1.0000 mg | INTRAMUSCULAR | Status: DC | PRN
  Administered 2011-11-21: 2 mg via INTRAVENOUS
  Filled 2011-11-21: qty 1

## 2011-11-21 NOTE — Progress Notes (Signed)
Patient's BP is 95/72, PA paged twice and no call back; will continue to monitor patient____________D. Manson Passey RN

## 2011-11-21 NOTE — Progress Notes (Signed)
Reported off to night nurse of patient's low BP, PA has still not called back; night nurse will continue to monitor patient_____________________________________________________________________D. Manson Passey RN

## 2011-11-21 NOTE — Clinical Documentation Improvement (Signed)
RENAL FAILURE DOCUMENTATION CLARIFICATION QUERY  THIS DOCUMENT IS NOT A PERMANENT PART OF THE MEDICAL RECORD  TO RESPOND TO THE THIS QUERY, FOLLOW THE INSTRUCTIONS BELOW:  1. If needed, update documentation for the patient's encounter via the notes activity.  2. Access this query again and click edit on the In [employer].  3. After updating, or not, click F2 to complete all highlighted (required) fields concerning your review. Select "additional documentation in the medical record" OR "no additional documentation provided".  4. Click Sign note button.  5. The deficiency will fall out of your In Basket *Please let us know if you are not able to complete this workflow by phone or e-mail (listed below).  Please update your documentation within the medical record to reflect your response to this query.                                                                                     11/21/11  Dear Dr. [facility]  In a better effort to capture your patient's severity of illness, reflect appropriate length of stay and utilization of resources, a review of the patient medical record has revealed the following indicators.    Based on your clinical judgment, please clarify and document in a progress note and/or discharge summary the clinical condition associated with the following supporting information:  In responding to this query please exercise your independent judgment.  The fact that a query is asked, does not imply that any particular answer is desired or expected.  Possible Clinical Conditions  _______Acute Kidney Injury  _______Acute on Chronic Renal Failure  _______Other Condition  _______Cannot Clinically Determine     Supporting Information:  Risk Factors: Hx CKD 3 Use of IV Lasix to treat a/c systolic HF  Signs and Symptoms: Shob "Trace, nonpitting BLE edema" per notes   Diagnostics: Results for LELON, IKARD (MRN [id number]) as of  11/21/2011 11:05  Ref. Range 11/19/2011 10:58 11/19/2011 13:42 11/20/2011 05:50 11/21/2011 05:02  Creat Latest Range: 0.50-1.35 mg/dL 4.09 (H) 8.11 (H) 9.14 (H) 1.65 (H)    Baseline Cr Level : 1-1.20  GFR: 41 on 4/16  Treatments:   Monitoring of labs and I & O's              You may use possible, probable, or suspect with inpatient documentation. possible, probable, suspected diagnoses MUST be documented at the time of discharge  Reviewed: additional documentation in the medical record - see 4/16 progress note addendum  Thank You,    Rossie Muskrat RN, BSN  Clinical Documentation Specialist Pager:  [phone] [email or web address]  Health Information Management [place]

## 2011-11-21 NOTE — Progress Notes (Addendum)
    Subjective:  Feels better. No chest pain. Mild dyspnea with activity. Notes improvement in breathing.  Objective:  Vital Signs in the last 24 hours: Temp:  [97.5 F (36.4 C)-98.6 F (37 C)] 97.7 F (36.5 C) (04/16 0639) Pulse Rate:  [60-64] 62  (04/16 0639) Resp:  [16-18] 18  (04/16 0639) BP: (90-120)/(34-76) 120/76 mmHg (04/16 0639) SpO2:  [97 %-99 %] 97 % (04/16 0639) Weight:  [104.055 kg (229 lb 6.4 oz)-104.2 kg (229 lb 11.5 oz)] 104.055 kg (229 lb 6.4 oz) (04/16 0639)  Intake/Output from previous day: 04/15 0701 - 04/16 0700 In: 1360 [P.O.:1360] Out: 1600 [Urine:1600]  Physical Exam: Pt is alert and oriented, chronically ill-appearing male in NAD HEENT: normal Neck: JVP - normal Lungs: CTA bilaterally CV: RRR without murmur or gallop Abd: soft, NT, Positive BS, no hepatomegaly Ext: bilateral calf edema improved with dressings over left leg Skin: warm/dry no rash  Lab Results:  Basename 11/19/11 1342 11/19/11 1058  WBC 6.3 6.5  HGB 11.7* 11.8*  PLT 167 177    Basename 11/21/11 0502 11/20/11 0550  NA 135 139  K 4.5 4.5  CL 97 100  CO2 26 28  GLUCOSE 130* 139*  BUN 27* 23  CREATININE 1.65* 1.50*   No results found for this basename: TROPONINI:2,CK,MB:2 in the last 72 hours  Assessment/Plan:  1. Acute on chronic systolic heart failure 2. CAD s/p CABG - no signs of active ischemia 3. Type 2 DM 4. Acute kidney injury. Baseline creatinine 0.8-1.1 now 1.6. Suspect realted to CHF and diuresis. Will continue to follow closely - this will limit our ability to diurese much further. 5. Venous stasis ulcerations - appreciate wound care eval  Pt is on appropriate Rx with coreg, losartan, and aldactone. Would push diuresis another 24 hours with IV furosemide and then convert back to PO lasix tomorrow. Will aim for discharge home tomorrow. Otherwise continue same meds.  Tonny Bollman, M.D. 11/21/2011, 8:27 AM

## 2011-11-21 NOTE — Progress Notes (Signed)
Patient is complaining of pain in lower extremities, pain medicine was given at 1014, patient states current pain medication is not helping pain, PA notified and orders given; will continue to monitor patient__________D. Manson Passey RN

## 2011-11-22 ENCOUNTER — Encounter (HOSPITAL_COMMUNITY): Payer: Self-pay | Admitting: Cardiology

## 2011-11-22 DIAGNOSIS — I5023 Acute on chronic systolic (congestive) heart failure: Secondary | ICD-10-CM

## 2011-11-22 LAB — GLUCOSE, CAPILLARY: Glucose-Capillary: 122 mg/dL — ABNORMAL HIGH (ref 70–99)

## 2011-11-22 LAB — BASIC METABOLIC PANEL
CO2: 29 mEq/L (ref 19–32)
Chloride: 99 mEq/L (ref 96–112)
GFR calc Af Amer: 55 mL/min — ABNORMAL LOW (ref >90)
Sodium: 137 mEq/L (ref 135–145)

## 2011-11-22 MED ORDER — POTASSIUM CHLORIDE CRYS ER 20 MEQ PO TBCR: 20.0000 meq | EXTENDED_RELEASE_TABLET | Freq: Two times a day (BID) | ORAL | Status: DC

## 2011-11-22 MED ORDER — FUROSEMIDE 80 MG PO TABS: 80.0000 mg | ORAL_TABLET | Freq: Two times a day (BID) | ORAL | Status: DC

## 2011-11-22 MED ORDER — FUROSEMIDE 80 MG PO TABS
80.0000 mg | ORAL_TABLET | Freq: Two times a day (BID) | ORAL | Status: DC
  Administered 2011-11-22: 80 mg via ORAL
  Filled 2011-11-22 (×3): qty 1

## 2011-11-22 NOTE — Discharge Summary (Signed)
Agree as above. See my progress note this same date.  Tonny Bollman 11/22/2011 10:44 PM

## 2011-11-22 NOTE — Progress Notes (Signed)
    Subjective:  No chest pain or dyspnea. No palpitations. The patient complains of a constant pain in the right shoulder that he has had for several days. His legs feel better.  Objective:  Vital Signs in the last 24 hours: Temp:  [97.6 F (36.4 C)-98.1 F (36.7 C)] 98 F (36.7 C) (04/17 0945) Pulse Rate:  [60-67] 66  (04/17 0945) Resp:  [16-20] 18  (04/17 0658) BP: (95-120)/(34-83) 118/46 mmHg (04/17 0945) SpO2:  [94 %-100 %] 94 % (04/17 0945) Weight:  [104.645 kg (230 lb 11.2 oz)] 104.645 kg (230 lb 11.2 oz) (04/17 0658)  Intake/Output from previous day: 04/16 0701 - 04/17 0700 In: 1485 [P.O.:1482; I.V.:3] Out: 1675 [Urine:1675]  Physical Exam: Pt is alert and oriented, pleasant male in NAD HEENT: normal Neck: JVP - normal, carotids 2+= without bruits Lungs: CTA bilaterally CV: RRR without murmur or gallop Abd: soft, NT, Positive BS, obese Ext: 2+ bilateral calf edema, improved from previous. Dressings over the legs are in place Skin: warm/dry no rash   Lab Results:  Basename 11/19/11 1342 11/19/11 1058  WBC 6.3 6.5  HGB 11.7* 11.8*  PLT 167 177    Basename 11/22/11 0500 11/21/11 0502  NA 137 135  K 4.3 4.5  CL 99 97  CO2 29 26  GLUCOSE 135* 130*  BUN 27* 27*  CREATININE 1.47* 1.65*   No results found for this basename: TROPONINI:2,CK,MB:2 in the last 72 hours  Tele: Sinus rhythm no significant arrhythmia  Assessment/Plan:  1. Acute on chronic systolic heart failure - clinically improved. Will continue his current medical program. I think he is ready for discharge today. I am not going to start him on an ACE inhibitor because of tenuous renal function. 2. CAD s/p CABG - no signs of active ischemia  3. Type 2 DM  4. Acute kidney injury. Creatinine has stabilized. We'll continue current medications. I am going to transition him to oral furosemide.  5. Venous stasis ulcerations - appreciate wound care eval 6. Disposition - plan on discharge home today with  close outpatient followup.  Tonny Bollman, M.D. 11/22/2011, 9:46 AM

## 2011-11-22 NOTE — Discharge Summary (Signed)
Discharge Summary   Patient ID: Mark French MRN: [id number], DOB/AGE: 68-Apr-1947 68 y.o.  Primary MD: [name], MD Primary Cardiologist: Dr. Excell Seltzer  Admit date: 11/19/2011 D/C date:     11/22/2011      Primary Discharge Diagnoses:  1. Acute on Chronic Systolic Heart Failure   - Improved with IV diuresis  - EF 45% by echo 1/13   2. Acute Respiratory Failure  - In the setting of #1, Resolved  3. Acute on Chronic Renal Insufficiency (Stage 3)  - Baseline Crt 0.8-1.1; 1.47 at discharge  - Suspect r/t CHF and diuresis  - BMET at follow up  4. Peripheral Vascular Insufficiency w/ venous stasis ulcerations  - Evaluated by wound care team w/ recs for silicone foam dressings and f/u ABIs/vascular consult if they worsen    Secondary Discharge Diagnoses:  1. HTN 2. HLD 3. CAD s/p CABG 1998, Acute inf MI 08/2011 w/ PCI diagonal branch vein graft 4. Diabetes Mellitus - A1C 6.7% 5. PAD - ABI's performed 04/19/11 R 0.72 L 0.71 6. CVA 2010 7. Obesity 8. Peripheral neuropathy 9. Diabetic Retinopathy 10. Blindness 11. Cervical Radiculopathy, Left 12. Iron deficiency anemia 13. H/o GI bleed 14. H/o duodenal ulcers 15. Depression 16. Hypogonadism  17. Erectile Dysfunction 18. Osteoarthritis 19. Charcot foot due to diabetes mellitus   Allergies Allergies  Allergen Reactions  . Contrast Media (Iodinated Diagnostic Agents) Nausea Only  . Sildenafil Nausea Only and Other (See Comments)    Diagnostic Studies/Procedures:   11/19/2011 - CXR  Findings: Sternotomy wires overlie normal cardiac silhouette.  No evidence effusion, infiltrate, or pneumothorax.  There is mild obscuration of the left heart border.  Legrand Rams this to represent chronic atelectasis.  There is mild central venous congestion.  IMPRESSION: Cardiomegaly and mild central venous congestion.  Left lingular atelectasis versus less likely pneumonia.     History of Present Illness: 66 y.o. male w/ the above medical  problems who presented to [facility]/19/11 with complaints of sob, wt gain, and worsening LE edema.  He saw Dr. Excell Seltzer about two weeks ago with worsening edema at which time Lasix was increased to 80 daily. He called the on call PA on the day of presentation c/o worsening sob, LE edema, PND, and 8lb weight gain (baseline weight about 230) and was advised to present to the [facility] ED for further evaluation and treatment.  Hospital Course: In the ED, EKG revealed SR with 1st degree A-V block, RBBB and no acute ST/T changes. CXR revealed cardiomegaly w/ mild central venous congestion. Labs significant for poc troponin 0.03, pBNP 1150, WBC 6.5, Crt 1.4. He was volume overloaded on exam and had mild blistering with 2 open sores on his left lower extremity. He was admitted for further evaluation and treatment.  He was diuresed with IV lasix with improvement in his symptoms. He was transitioned to oral lasix on day of discharge. He had some acute on chronic renal insufficiency in the setting of diuresis with improving Crt on day of discharge. His LE wound was evaluated by the wound care team w/ recs for silicone foam dressings and f/u ABIs/vascular consult if they worsen.   He was seen and evaluated by Dr. Excell Seltzer who felt he was stable for discharge home with plans for follow up as scheduled below. Discharge weight 230lbs (104.6kg).  Discharge Vitals: Blood pressure 115/54, pulse 62, temperature 97.9 F (36.6 C), temperature source Oral, resp. rate 20, height 5\' 8"  (1.727 m), weight  230 lb 11.2 oz (104.645 kg), SpO2 97.00%.  Labs: Component Value Date   WBC 6.3 11/19/2011   HGB 11.7* 11/19/2011   HCT 37.1* 11/19/2011   MCV 87.3 11/19/2011   PLT 167 11/19/2011    Lab 11/22/11 0500 11/19/11 1058  NA 137 --  K 4.3 --  CL 99 --  CO2 29 --  BUN 27* --  CREATININE 1.47* --  CALCIUM 9.3 --  PROT -- 7.2  BILITOT -- 0.4  ALKPHOS -- 68  ALT -- 13  AST -- 16  GLUCOSE 135* --     11/19/2011 10:58   Pro B Natriuretic peptide (BNP) 1150.0 (H)     11/19/2011 11:10  Troponin i, poc 0.03     11/19/2011 18:54  Hemoglobin A1C 6.7 (H)     Discharge Medications   Medication List  As of 11/22/2011  2:40 PM   TAKE these medications         aspirin 81 MG chewable tablet   Chew 81 mg by mouth daily.      carvedilol 6.25 MG tablet   Commonly known as: COREG   Take 6.25 mg by mouth 2 (two) times daily with a meal.      clopidogrel 75 MG tablet   Commonly known as: PLAVIX   Take 75 mg by mouth daily with breakfast.      clotrimazole-betamethasone cream   Commonly known as: LOTRISONE   Apply 1 application topically 2 (two) times daily as needed. For rash      furosemide 80 MG tablet   Commonly known as: LASIX   Take 1 tablet (80 mg total) by mouth 2 (two) times daily.      gabapentin 300 MG capsule   Commonly known as: NEURONTIN   Take 300 mg by mouth 2 (two) times daily.      HYDROcodone-acetaminophen 10-325 MG per tablet   Commonly known as: NORCO   Take 1-2 tablets by mouth every 4 (four) hours as needed. For pain      insulin aspart 100 UNIT/ML injection   Commonly known as: novoLOG   Inject 15 Units into the skin 3 (three) times daily with meals.      insulin NPH 100 UNIT/ML injection   Commonly known as: HUMULIN N,NOVOLIN N   Inject 30 Units into the skin daily before breakfast.      losartan 50 MG tablet   Commonly known as: COZAAR   Take 50 mg by mouth daily.      nitroGLYCERIN 0.4 MG SL tablet   Commonly known as: NITROSTAT   Place 0.4 mg under the tongue every 5 (five) minutes as needed. For chest pain      omeprazole 20 MG capsule   Commonly known as: PRILOSEC   Take 20 mg by mouth daily.      potassium chloride SA 20 MEQ tablet   Commonly known as: K-DUR,KLOR-CON   Take 1 tablet (20 mEq total) by mouth 2 (two) times daily.      rosuvastatin 20 MG tablet   Commonly known as: CRESTOR   Take 20 mg by mouth daily.      spironolactone 25 MG tablet    Commonly known as: ALDACTONE   Take 12.5 mg by mouth daily.      STOOL SOFTENER 100 MG capsule   Generic drug: Docusate Sodium   Take 100 mg by mouth daily as needed. constipation      Tamsulosin HCl 0.4 MG Caps  Commonly known as: FLOMAX   Take 0.4 mg by mouth daily.            Disposition   Discharge Orders    Future Appointments: Provider: Department: Dept Phone: Center:   11/29/2011 12:00 PM [facility] [phone] [facility]   12/05/2011 9:50 AM Beatrice Lecher, PA [facility] [phone] [facility]   01/30/2012 3:00 PM Tonny Bollman, MD [facility] [phone] [facility]   01/30/2012 3:15 PM [facility] [phone] [facility]     Future Orders Please Complete By Expires   Diet - low sodium heart healthy      Increase activity slowly      Discharge instructions      Comments:   **PLEASE REMEMBER TO BRING ALL OF YOUR MEDICATIONS TO EACH OF YOUR FOLLOW-UP OFFICE VISITS.       Follow-up Information    Follow up with [facility] on 11/29/2011. (Labs at 12:00)    Contact information:   [facility] [address] [place] [address] [phone]      Follow up with Tereso Newcomer, PA on 12/05/2011. (9:50)    Contact information:   [facility] [address] [place] [address] [phone]           Outstanding Labs/Studies:  BMET in 1 wk  Duration of Discharge Encounter: Greater than 30 minutes including physician and PA time.  Signed, Arlen Legendre PA-C 11/22/2011, 2:40 PM

## 2011-11-23 NOTE — Progress Notes (Signed)
Clinical Social Work Department ADVANCED HEART FAILURE BRIEF PSYCHOSOCIAL ASSESSMENT 11/21/2011  Patient:  Mark French, Mark French   Account Number:  [id number]  Admit Date:  11/19/2011  Clinical Social Worker:  Juliette Mangle   Date/Time:  11/21/2011 02:00 PM  Referred by:  CHF Pilot  Referral date:  11/21/2011 Referred for  Psychosocial assessment   Other referral type:    CHf Disease Management  Home Health Screen   Interview type:  Patient Interview Other interview type:    PSYCHOSOCIAL DATA Living Status:  WIFE Admitted from facility?  N Level of care:    Primary Support Name Primary Support Relationship  Bolin,Marilyn SPOUSE   Degree of support available:   Very Good      Social Work assessment/plan:   CSW received a referral for a home health screen for CHF disease management.  CSW met with patient at bedside to offer emotional support as well as discuss home health needs. CSW also assessed for degree of support, symptoms of depression and discuss CHF disease management. Patient reported that he currently lives with his wife who is very supportive and takes care of all his medical needs. Patient reported that his wife cooks all low sodium meals, manages all his meds and records his daily weights. Patient reported no need for home health for disease management. Patient also reported no symptoms of depression. Clinical Social Work will sign off as social work intervention is no longer needed   Depression:   No Symptoms reported   PHQ-9:     Home health:  N Home health choice:     Other Referrals/interventions:   Patient's/Family's response to plan of care:   Patient was receptive and appreciative all information provided by CSW.    Sabino Niemann, MSW, [employer] [phone]

## 2011-11-27 ENCOUNTER — Telehealth: Payer: Self-pay | Admitting: Cardiovascular Disease

## 2011-11-27 NOTE — Telephone Encounter (Signed)
I spoke with the pt and he complains of a "little SOB" when sitting.  The pt denies CP. The pt's weight today is 232 which is his target weight.  The pt does have a "little" swelling in his legs. The pt has been able to sleep in his recliner since being discharged from the hospital. The pt's urination has improved and the dizziness he was having seems to be better now.  The pt's BP 114/59, pulse 64.  Today the pt feels uneasy because his wife is going back to work and he will be at home alone.  The pt's wife works 10 minutes away and he does have a neighbor who checks on him frequently. I made the pt aware that I feel like his vitals are okay.  I reassured the pt that if he had any questions or concerns to contact our office.  Pt agreed with plan.   I will ask Dr Excell Seltzer if this pt needs home health.

## 2011-11-27 NOTE — Telephone Encounter (Signed)
New msg Pt's wife said he has been dizzy since he got up. She said he has no chest pain. Please call

## 2011-11-29 ENCOUNTER — Other Ambulatory Visit (INDEPENDENT_AMBULATORY_CARE_PROVIDER_SITE_OTHER): Payer: Medicare Other

## 2011-11-29 ENCOUNTER — Other Ambulatory Visit: Payer: Medicare Other

## 2011-11-29 DIAGNOSIS — I5023 Acute on chronic systolic (congestive) heart failure: Secondary | ICD-10-CM

## 2011-11-29 DIAGNOSIS — I25810 Atherosclerosis of coronary artery bypass graft(s) without angina pectoris: Secondary | ICD-10-CM

## 2011-11-29 LAB — BASIC METABOLIC PANEL
BUN: 30 mg/dL — ABNORMAL HIGH (ref 6–23)
CO2: 29 mEq/L (ref 19–32)
Calcium: 9 mg/dL (ref 8.4–10.5)
Creatinine, Ser: 1.5 mg/dL (ref 0.4–1.5)

## 2011-11-30 ENCOUNTER — Telehealth: Payer: Self-pay | Admitting: Cardiovascular Disease

## 2011-11-30 ENCOUNTER — Encounter: Payer: Self-pay | Admitting: Physician Assistant

## 2011-11-30 ENCOUNTER — Telehealth: Payer: Self-pay | Admitting: *Deleted

## 2011-11-30 ENCOUNTER — Ambulatory Visit (INDEPENDENT_AMBULATORY_CARE_PROVIDER_SITE_OTHER): Payer: Medicare Other | Admitting: Physician Assistant

## 2011-11-30 VITALS — BP 114/58 | HR 64 | Ht 68.0 in | Wt 230.0 lb

## 2011-11-30 DIAGNOSIS — I5022 Chronic systolic (congestive) heart failure: Secondary | ICD-10-CM

## 2011-11-30 DIAGNOSIS — I10 Essential (primary) hypertension: Secondary | ICD-10-CM

## 2011-11-30 DIAGNOSIS — R609 Edema, unspecified: Secondary | ICD-10-CM

## 2011-11-30 DIAGNOSIS — R0602 Shortness of breath: Secondary | ICD-10-CM

## 2011-11-30 DIAGNOSIS — M79609 Pain in unspecified limb: Secondary | ICD-10-CM

## 2011-11-30 DIAGNOSIS — I2510 Atherosclerotic heart disease of native coronary artery without angina pectoris: Secondary | ICD-10-CM

## 2011-11-30 DIAGNOSIS — R079 Chest pain, unspecified: Secondary | ICD-10-CM

## 2011-11-30 DIAGNOSIS — L97909 Non-pressure chronic ulcer of unspecified part of unspecified lower leg with unspecified severity: Secondary | ICD-10-CM

## 2011-11-30 LAB — D-DIMER, QUANTITATIVE: D-Dimer, Quant: 0.65 ug/mL-FEU — ABNORMAL HIGH (ref 0.00–0.48)

## 2011-11-30 LAB — BRAIN NATRIURETIC PEPTIDE: Pro B Natriuretic peptide (BNP): 207 pg/mL — ABNORMAL HIGH (ref 0.0–100.0)

## 2011-11-30 MED ORDER — NITROGLYCERIN 0.4 MG SL SUBL: 0.4000 mg | SUBLINGUAL_TABLET | SUBLINGUAL | Status: DC | PRN

## 2011-11-30 NOTE — Telephone Encounter (Signed)
I spoke with the patient and his wife about is d-dimer. They are aware of his results and the need for V/Q scanning this afternoon or in the morning. The patient's wife is aware I will order this test and will have our schedulers call her back today at her work number.

## 2011-11-30 NOTE — Telephone Encounter (Signed)
Close  

## 2011-11-30 NOTE — Progress Notes (Signed)
[address] [place], [place]  [address] Phone: [phone] Fax:  [phone]  Date:  11/30/2011   Name:  Mark French       DOB:  1946/01/25 MRN:  [id number]  PCP:  [name]  Primary Cardiologist:  Dr. Tonny Bollman  Primary Electrophysiologist:  None    History of Present Illness: Mark French is a 66 y.o. male who presents for follow up.    He has a history of CAD, status post CABG in 1998, peripheral arterial disease, diabetes, hypertension, venous insufficiency, hyperlipidemia and prior upper GI bleed as well as a history of stroke at the age of 2 months.  He was admitted 08/2011 with an inferior STEMI.  LHC 09/07/11: Severe  Three-vessel CAD, LIMA-LAD patent, SVG-RCA chronically occluded, SVG-circumflex chronically occluded, SVG-diagonal 95%.  PCI: Promus DES to the SVG-diagonal.  Procedure complicated by transient complete heart block requiring CPR for 20 seconds and temporary transvenous pacemaker.  Echocardiogram 09/07/11: Difficult study, inferior and posterior lateral wall hypokinesis, mild LVH, EF 45%, mild LAE.  The patient had evidence of volume overload.  He was diuresed.  Discharged to [facility] nursing home.  He has chronic lower extremity edema.    He was admitted 4/14-4/17 with acute on chronic systolic heart failure.  He had previously seen Dr. Excell Seltzer in the office with increase in his Lasix.  He was diuresed with IV Lasix.  His creatinine increased some.  Followup labs 11/29/11: Potassium 4.2, BUN 30, creatinine 1.5.   He required lower extremity wound care.  He called in today with complaints of lower extremity edema was put on my schedule.  He reports difficulty with breathing.  He seems to note this in the mornings.  He states it's an uncomfortable feeling.  His chest may feel tight at times.  Activity seems to help.  Wts are down since discharge.  LE edema is improved.  No syncope.  Denies recurrent anginal symptoms.  No pleuritic pain.   Sleeps in a recliner chronically.  No PND.  No snoring.  Wife does note ? Apneic episodes.  He admits to daytime hypersomnolence.  Notes significant pain in legs from neuropathy.  Also, has leg wound from recent exacerbation of edema that is slow to heal and painful.    Past Medical History  Diagnosis Date  . CORONARY ARTERY DISEASE 03/11/2007    s/p CABG 1998;  admitted 08/2011 with an inferior STEMI.  LHC 09/07/11: Severe  Three-vessel CAD, LIMA-LAD patent, SVG-RCA chronically occluded, SVG-circumflex chronically occluded, SVG-diagonal 95%.  PCI: Promus DES to the SVG-diagonal.  Procedure complicated by transient complete heart block requiring CPR for 20 seconds and temporary transvenous pacemaker  . DIABETES MELLITUS, TYPE I 03/11/2007  . HYPERTENSION 03/11/2007  . BLINDNESS 05/21/2008  . Proliferative diabetic retinopathy 03/09/2009  . HYPERCHOLESTEROLEMIA 09/28/2008  . CERVICAL RADICULOPATHY, LEFT 09/23/2008  . ANEMIA-IRON DEFICIENCY 09/23/2008  . DEPRESSION 09/23/2008  . PERIPHERAL NEUROPATHY 09/23/2008  . Chronic systolic heart failure 09/23/2008    Echocardiogram 09/07/11: Difficult study, inferior and posterior lateral wall hypokinesis, mild LVH, EF 45%, mild LAE.   [name] RENAL INSUFFICIENCY 03/15/2010  . CEREBROVASCULAR ACCIDENT, HX OF 09/23/2008  . PERIPHERAL VASCULAR INSUFFICIENCY,  LEGS, BILATERAL 08/17/2010    ABI's performed 04/19/11 R 0.72 L 0.71  . ED (erectile dysfunction)   . Charcot foot due to diabetes mellitus     chronic pain  . Hypogonadism male   . GI bleed 2012    Multiple Duodenal  ulcer   . Osteoarthritis   . Obesity     Current Outpatient Prescriptions  Medication Sig Dispense Refill  . aspirin 81 MG chewable tablet Chew 81 mg by mouth daily.      . carvedilol (COREG) 6.25 MG tablet Take 6.25 mg by mouth 2 (two) times daily with a meal.      . clopidogrel (PLAVIX) 75 MG tablet Take 75 mg by mouth daily with breakfast.      . clotrimazole-betamethasone (LOTRISONE) cream Apply 1  application topically 2 (two) times daily as needed. For rash      . Docusate Sodium (STOOL SOFTENER) 100 MG capsule Take 100 mg by mouth daily as needed. constipation      . furosemide (LASIX) 80 MG tablet Take 1 tablet (80 mg total) by mouth 2 (two) times daily.  60 tablet  3  . gabapentin (NEURONTIN) 300 MG capsule Take 300 mg by mouth 2 (two) times daily.      [name] HYDROcodone-acetaminophen (NORCO) 10-325 MG per tablet Take 1-2 tablets by mouth every 4 (four) hours as needed. For pain      . insulin aspart (NOVOLOG) 100 UNIT/ML injection Inject 15 Units into the skin 3 (three) times daily with meals.      . insulin NPH (HUMULIN N,NOVOLIN N) 100 UNIT/ML injection Inject 30 Units into the skin daily before breakfast.       . losartan (COZAAR) 50 MG tablet Take 50 mg by mouth daily.      . nitroGLYCERIN (NITROSTAT) 0.4 MG SL tablet Place 0.4 mg under the tongue every 5 (five) minutes as needed. For chest pain      . omeprazole (PRILOSEC) 20 MG capsule Take 20 mg by mouth daily.      . potassium chloride SA (K-DUR,KLOR-CON) 20 MEQ tablet Take 1 tablet (20 mEq total) by mouth 2 (two) times daily.  60 tablet  3  . rosuvastatin (CRESTOR) 20 MG tablet Take 20 mg by mouth daily.      [name] spironolactone (ALDACTONE) 25 MG tablet Take 12.5 mg by mouth daily.      . Tamsulosin HCl (FLOMAX) 0.4 MG CAPS Take 0.4 mg by mouth daily.      [name] DISCONTD: gabapentin (NEURONTIN) 300 MG capsule Take 1 capsule (300 mg total) by mouth at bedtime.  30 capsule  11    Allergies: Allergies  Allergen Reactions  . Contrast Media (Iodinated Diagnostic Agents) Nausea Only  . Sildenafil Nausea Only and Other (See Comments)    History  Substance Use Topics  . Smoking status: Never Smoker   . Smokeless tobacco: Never Used  . Alcohol Use: No     ROS:  Please see the history of present illness.    All other systems reviewed and negative.   PHYSICAL EXAM: VS:  BP 114/58  Pulse 64  Ht 5\' 8"  (1.727 m)  Wt 230 lb (104.327  kg)  BMI 34.97 kg/m2 O2 98% on RA Well nourished, well developed, in no acute distress HEENT: normal Neck: no JVD at 90 Degrees Cardiac:  normal S1, S2; RRR; no murmur Lungs:  clear to auscultation bilaterally, no wheezing, rhonchi or rales Abd: soft, nontender Ext: 1+ brawny bilateral LE edema Skin: warm and dry; quarter sized abrasive appearing lesion with good granulation tissue and no erythema or discharge Neuro:  CNs 2-12 intact, no focal abnormalities noted  EKG:  NSR, HR 64, LAD, RBBB, no change from prior  ASSESSMENT AND PLAN:  1. Shortness of  breath  Etiology of symptoms not clear to me.  His volume appears improved from his recent admission for a/c systolic CHF.  I will check a BNP and adjust diuretics if necessary.  He had a recent hospitalization and has chronic LE edema likely impacted by venous insufficiency.  Check a DDimer to rule out possibility of PE.  If elevated, he will need a V/Q scan.  He feels like something is just not right.  He denies recurrence of his angina.  But, will arrange Lexiscan Myoview to rule out ischemia.  Follow up with me or Dr. Excell Seltzer in 1-2 weeks.  May have symptoms of sleep apnea.  If workup negative, consider sleep study.   2. Coronary atherosclerosis of native coronary artery  Continue ASA, Plavix, statin.  Arrange myoview.  Follow up as planned.   3. Chest pain, unspecified  Arrange myoview   4. Chronic systolic heart failure  Volume appears stable.  Check BNP.  Increase Lasix if needed.   5. HYPERTENSION  Controlled.  Continue current therapy.    6. Edema  Improved.  Continue current Rx.   7. LEG PAIN  Advised him to follow up with PCP for further management of neuropathy.   8. ULCER, LEG  Will try to arrange [facility] for wound care.      Signed, Tereso Newcomer, PA-C  12:11 PM 11/30/2011

## 2011-11-30 NOTE — Patient Instructions (Addendum)
Your physician recommends that you have lab work today: bnp/ d-dimer  A chest x-ray takes a picture of the organs and structures inside the chest, including the heart, lungs, and blood vessels. This test can show several things, including, whether the heart is enlarges; whether fluid is building up in the lungs; and whether pacemaker / defibrillator leads are still in place.  Your physician has requested that you have a lexiscan myoview. For further information please visit [email or web address] Please follow instruction sheet, as given.  Your physician recommends that you keep your follow-up appointment on: Tuesday 12/05/11 at 9:50 am with Tereso Newcomer.

## 2011-11-30 NOTE — Telephone Encounter (Signed)
Spoke with wife, Patient C/O of SOB when sitting this AM. wife states the sob  gets worse after eating and taken medications. Patient denies chest pain. An appointment  made for pt with Tereso Newcomer PA for today at 11:30 AM wife aware.

## 2011-11-30 NOTE — Telephone Encounter (Signed)
Fu call Pt said he has been having sob in the mornings. He wanted to talk to you about this Please call

## 2011-12-01 ENCOUNTER — Other Ambulatory Visit: Payer: Self-pay | Admitting: Physician Assistant

## 2011-12-01 ENCOUNTER — Other Ambulatory Visit: Payer: Self-pay | Admitting: *Deleted

## 2011-12-01 ENCOUNTER — Encounter (HOSPITAL_COMMUNITY)
Admission: RE | Admit: 2011-12-01 | Discharge: 2011-12-01 | Disposition: A | Discharge status: Home / Self Care | Payer: Medicare Other | Source: Ambulatory Visit | Attending: Physician Assistant | Admitting: Physician Assistant

## 2011-12-01 ENCOUNTER — Ambulatory Visit (HOSPITAL_COMMUNITY)
Admission: RE | Admit: 2011-12-01 | Discharge: 2011-12-01 | Disposition: A | Discharge status: Home / Self Care | Payer: Medicare Other | Source: Ambulatory Visit | Attending: Physician Assistant | Admitting: Physician Assistant

## 2011-12-01 DIAGNOSIS — R609 Edema, unspecified: Secondary | ICD-10-CM

## 2011-12-01 DIAGNOSIS — R0602 Shortness of breath: Secondary | ICD-10-CM | POA: Insufficient documentation

## 2011-12-01 DIAGNOSIS — I252 Old myocardial infarction: Secondary | ICD-10-CM | POA: Insufficient documentation

## 2011-12-01 DIAGNOSIS — N289 Disorder of kidney and ureter, unspecified: Secondary | ICD-10-CM

## 2011-12-01 DIAGNOSIS — R079 Chest pain, unspecified: Secondary | ICD-10-CM | POA: Insufficient documentation

## 2011-12-01 DIAGNOSIS — I2510 Atherosclerotic heart disease of native coronary artery without angina pectoris: Secondary | ICD-10-CM

## 2011-12-01 DIAGNOSIS — Z950 Presence of cardiac pacemaker: Secondary | ICD-10-CM | POA: Insufficient documentation

## 2011-12-01 DIAGNOSIS — R791 Abnormal coagulation profile: Secondary | ICD-10-CM | POA: Insufficient documentation

## 2011-12-01 DIAGNOSIS — I517 Cardiomegaly: Secondary | ICD-10-CM | POA: Insufficient documentation

## 2011-12-01 MED ORDER — TECHNETIUM TO 99M ALBUMIN AGGREGATED
3.0000 | Freq: Once | INTRAVENOUS | Status: AC | PRN
  Administered 2011-12-01: 3 via INTRAVENOUS

## 2011-12-02 NOTE — ED Provider Notes (Signed)
Evaluation and management procedures were performed by the PA/NP/resident physician under my supervision/collaboration.   Avish Torry D Taleya Whitcher, MD 12/02/11 2025 

## 2011-12-04 ENCOUNTER — Ambulatory Visit (HOSPITAL_COMMUNITY): Payer: Medicare Other | Attending: Cardiology | Admitting: Radiology

## 2011-12-04 ENCOUNTER — Telehealth: Payer: Self-pay | Admitting: *Deleted

## 2011-12-04 VITALS — BP 117/60 | Ht 68.0 in | Wt 227.0 lb

## 2011-12-04 DIAGNOSIS — R5381 Other malaise: Secondary | ICD-10-CM | POA: Insufficient documentation

## 2011-12-04 DIAGNOSIS — R5383 Other fatigue: Secondary | ICD-10-CM | POA: Insufficient documentation

## 2011-12-04 DIAGNOSIS — R0989 Other specified symptoms and signs involving the circulatory and respiratory systems: Secondary | ICD-10-CM | POA: Insufficient documentation

## 2011-12-04 DIAGNOSIS — I2510 Atherosclerotic heart disease of native coronary artery without angina pectoris: Secondary | ICD-10-CM

## 2011-12-04 DIAGNOSIS — Z951 Presence of aortocoronary bypass graft: Secondary | ICD-10-CM | POA: Insufficient documentation

## 2011-12-04 DIAGNOSIS — R079 Chest pain, unspecified: Secondary | ICD-10-CM

## 2011-12-04 DIAGNOSIS — E119 Type 2 diabetes mellitus without complications: Secondary | ICD-10-CM | POA: Insufficient documentation

## 2011-12-04 DIAGNOSIS — I739 Peripheral vascular disease, unspecified: Secondary | ICD-10-CM | POA: Insufficient documentation

## 2011-12-04 DIAGNOSIS — E785 Hyperlipidemia, unspecified: Secondary | ICD-10-CM | POA: Insufficient documentation

## 2011-12-04 DIAGNOSIS — E669 Obesity, unspecified: Secondary | ICD-10-CM | POA: Insufficient documentation

## 2011-12-04 DIAGNOSIS — R0602 Shortness of breath: Secondary | ICD-10-CM

## 2011-12-04 DIAGNOSIS — Z8673 Personal history of transient ischemic attack (TIA), and cerebral infarction without residual deficits: Secondary | ICD-10-CM | POA: Insufficient documentation

## 2011-12-04 DIAGNOSIS — I779 Disorder of arteries and arterioles, unspecified: Secondary | ICD-10-CM | POA: Insufficient documentation

## 2011-12-04 DIAGNOSIS — I10 Essential (primary) hypertension: Secondary | ICD-10-CM | POA: Insufficient documentation

## 2011-12-04 DIAGNOSIS — Z794 Long term (current) use of insulin: Secondary | ICD-10-CM | POA: Insufficient documentation

## 2011-12-04 DIAGNOSIS — R0609 Other forms of dyspnea: Secondary | ICD-10-CM | POA: Insufficient documentation

## 2011-12-04 MED ORDER — TECHNETIUM TC 99M TETROFOSMIN IV KIT
10.9000 | PACK | Freq: Once | INTRAVENOUS | Status: AC | PRN
  Administered 2011-12-04: 10.9 via INTRAVENOUS

## 2011-12-04 MED ORDER — REGADENOSON 0.4 MG/5ML IV SOLN
0.4000 mg | Freq: Once | INTRAVENOUS | Status: AC
  Administered 2011-12-04: 0.4 mg via INTRAVENOUS

## 2011-12-04 MED ORDER — TECHNETIUM TC 99M TETROFOSMIN IV KIT
33.0000 | PACK | Freq: Once | INTRAVENOUS | Status: AC | PRN
  Administered 2011-12-04: 33 via INTRAVENOUS

## 2011-12-04 NOTE — Telephone Encounter (Signed)
Message copied by Tarri Fuller on Mon Dec 04, 2011 10:03 AM ------      Message from: [place], [place] T      Created: Sun Dec 03, 2011  8:52 PM       Negative for pulmonary embolism      Tereso Newcomer, PA-C  8:52 PM 12/03/2011

## 2011-12-04 NOTE — Telephone Encounter (Signed)
pt's wife notified of V/Q scan results, no PE. pt's wife gave verbal understanding to me today. Danielle Rankin

## 2011-12-04 NOTE — Progress Notes (Signed)
[facility] SITE 3 NUCLEAR MED [address] [place] [address] [phone]  Cardiology Nuclear Med Study  Mark French is a 66 y.o. male     MRN : [id number]     DOB: 1946/05/08  Procedure Date: 12/04/2011  Nuclear Med Background Indication for Stress Test:  Evaluation for Ischemia and Stent/Graft Patency  History:  '98  CABG; 1/13 Echo:EF=45%; 1/13 Inferior STEMI>Stent-SVG-Diag. Cardiac Risk Factors: Carotid Disease, CVA, Hypertension, IDDM Type 2, Lipids, Obesity and PVD  Symptoms:  Chest Pain (last episode of chest discomfort: none since Stent in January), DOE/SOB and Fatigue    Nuclear Pre-Procedure Caffeine/Decaff Intake:  8:30pm NPO After: 7:00pm   Lungs:  clear O2 Sat: 98% on room air. IV 0.9% NS with Angio Cath:  22g  IV Site: R Hand  IV Started by:  Cathlyn Parsons, RN  Chest Size (in):  54 Cup Size: n/a  Height: 5\' 8"  (1.727 m)  Weight:  227 lb (102.967 kg)  BMI:  Body mass index is 34.52 kg/(m^2). Tech Comments:  Coreg taken 6:30 am, Fasting CBG=114; no insulin taken    Nuclear Med Study 1 or 2 day study: 1 day  Stress Test Type:  Lexiscan  Reading MD: Olga Millers, MD  Order Authorizing Provider:  Axel Filler and Lorin Picket [facility]  Resting Radionuclide: Technetium 69m Tetrofosmin  Resting Radionuclide Dose: 10.9 mCi   Stress Radionuclide:  Technetium 75m Tetrofosmin  Stress Radionuclide Dose: 33.0 mCi           Stress Protocol Rest HR: 63 Stress HR: 72  Rest BP: 117/60 Stress BP: 131/63  Exercise Time (min): n/a METS: n/a   Predicted Max HR: 152 bpm % Max HR: 47.37 bpm Rate Pressure Product: 9432   Dose of Adenosine (mg):  n/a Dose of Lexiscan: 0.4 mg  Dose of Atropine (mg): n/a Dose of Dobutamine: n/a mcg/kg/min (at max HR)  Stress Test Technologist: Smiley Houseman, CMA-N  Nuclear Technologist:  Doyne Keel, CNMT     Rest Procedure:  Myocardial perfusion imaging was performed at rest 45 minutes following the  intravenous administration of Technetium 82m Tetrofosmin.  Rest ECG: RBBB with prior IWMI and possible prior LWMI, occasional PAC's were noted.  Stress Procedure:  The patient received IV Lexiscan 0.4 mg over 15-seconds.  Technetium 75m Tetrofosmin injected at 30-seconds.  There were no significant changes with Lexiscan bolus, occasional PAC's and rare PVC was noted.  Quantitative spect images were obtained after a 45 minute delay.  Stress ECG: No significant ST segment change suggestive of ischemia.  QPS Raw Data Images:  Acquisition technically good; severe LVE. Stress Images:  There is decreased uptake in the inferolateral wall. Rest Images:  There is decreased uptake in the inferolateral wall. Subtraction (SDS):  There is a fixed defect that is most consistent with a previous infarction. Transient Ischemic Dilatation (Normal <1.22): 0.98 Lung/Heart Ratio (Normal <0.45):  0,43  Quantitative Gated Spect Images QGS EDV:  237 ml QGS ESV:  178 ml  Impression Exercise Capacity:  Lexiscan with no exercise. BP Response:  Normal blood pressure response. Clinical Symptoms:  There is dyspnea. ECG Impression:  No significant ST segment change suggestive of ischemia. Comparison with Prior Nuclear Study: No previous nuclear study performed  Overall Impression:  Abnormal stress nuclear study with a large, severe, fixed inferolateral defect consistent with previous infarct; no ischemia.  LV Ejection Fraction: 25%.  LV Wall Motion:  Inferolateral akinesis.   Olga Millers

## 2011-12-05 ENCOUNTER — Emergency Department (HOSPITAL_COMMUNITY)
Admission: EM | Admit: 2011-12-05 | Discharge: 2011-12-06 | Disposition: A | Discharge status: Home / Self Care | Payer: Medicare Other | Attending: Emergency Medicine | Admitting: Emergency Medicine

## 2011-12-05 ENCOUNTER — Encounter (HOSPITAL_COMMUNITY): Payer: Self-pay | Admitting: *Deleted

## 2011-12-05 ENCOUNTER — Encounter: Payer: Medicare Other | Admitting: Physician Assistant

## 2011-12-05 ENCOUNTER — Other Ambulatory Visit (HOSPITAL_COMMUNITY): Payer: Medicare Other

## 2011-12-05 ENCOUNTER — Telehealth: Payer: Self-pay | Admitting: Cardiovascular Disease

## 2011-12-05 DIAGNOSIS — F3289 Other specified depressive episodes: Secondary | ICD-10-CM | POA: Insufficient documentation

## 2011-12-05 DIAGNOSIS — I2510 Atherosclerotic heart disease of native coronary artery without angina pectoris: Secondary | ICD-10-CM | POA: Insufficient documentation

## 2011-12-05 DIAGNOSIS — Z7982 Long term (current) use of aspirin: Secondary | ICD-10-CM | POA: Insufficient documentation

## 2011-12-05 DIAGNOSIS — R0600 Dyspnea, unspecified: Secondary | ICD-10-CM

## 2011-12-05 DIAGNOSIS — Z79899 Other long term (current) drug therapy: Secondary | ICD-10-CM | POA: Insufficient documentation

## 2011-12-05 DIAGNOSIS — R0609 Other forms of dyspnea: Secondary | ICD-10-CM | POA: Insufficient documentation

## 2011-12-05 DIAGNOSIS — M1990 Unspecified osteoarthritis, unspecified site: Secondary | ICD-10-CM | POA: Insufficient documentation

## 2011-12-05 DIAGNOSIS — E109 Type 1 diabetes mellitus without complications: Secondary | ICD-10-CM | POA: Insufficient documentation

## 2011-12-05 DIAGNOSIS — Z8673 Personal history of transient ischemic attack (TIA), and cerebral infarction without residual deficits: Secondary | ICD-10-CM | POA: Insufficient documentation

## 2011-12-05 DIAGNOSIS — I739 Peripheral vascular disease, unspecified: Secondary | ICD-10-CM | POA: Insufficient documentation

## 2011-12-05 DIAGNOSIS — E7800 Pure hypercholesterolemia, unspecified: Secondary | ICD-10-CM | POA: Insufficient documentation

## 2011-12-05 DIAGNOSIS — I10 Essential (primary) hypertension: Secondary | ICD-10-CM | POA: Insufficient documentation

## 2011-12-05 DIAGNOSIS — R609 Edema, unspecified: Secondary | ICD-10-CM | POA: Insufficient documentation

## 2011-12-05 DIAGNOSIS — R0989 Other specified symptoms and signs involving the circulatory and respiratory systems: Secondary | ICD-10-CM | POA: Insufficient documentation

## 2011-12-05 DIAGNOSIS — Z794 Long term (current) use of insulin: Secondary | ICD-10-CM | POA: Insufficient documentation

## 2011-12-05 DIAGNOSIS — R0602 Shortness of breath: Secondary | ICD-10-CM | POA: Insufficient documentation

## 2011-12-05 DIAGNOSIS — F329 Major depressive disorder, single episode, unspecified: Secondary | ICD-10-CM | POA: Insufficient documentation

## 2011-12-05 NOTE — ED Notes (Signed)
Pt c/o SOB since this morning, denies CP, n/v, diaphoresis, fevers, chills.

## 2011-12-05 NOTE — Telephone Encounter (Signed)
I spoke with the pt and made him aware of myoview results from 12/04/11.  EF 25% by Myoview, this was 45% by Echo in January.  The pt is continuing to complain of SOB that has gotten worse since this morning, the pt states that he feels like he is suffocating. The pt's weight today is 230 (target weight 232).  I advised the pt that he needs to go to the ER for further evaluation of SOB.  The pt said his wife will be home in 20-30 minutes and that he will speak with her about going back to the hospital.

## 2011-12-05 NOTE — Telephone Encounter (Signed)
Patient request return call at [phone]   Patient had Stress test Yesterday in office.  Patient is still feeling SOB this morning, chest discomfort, no dizziness. Please return call to patient at [phone].

## 2011-12-06 ENCOUNTER — Other Ambulatory Visit: Payer: Self-pay

## 2011-12-06 ENCOUNTER — Emergency Department (HOSPITAL_COMMUNITY): Payer: Medicare Other

## 2011-12-06 LAB — CBC
HCT: 39 % (ref 39.0–52.0)
Hemoglobin: 12.5 g/dL — ABNORMAL LOW (ref 13.0–17.0)
RDW: 13.9 % (ref 11.5–15.5)
WBC: 7.3 10*3/uL (ref 4.0–10.5)

## 2011-12-06 LAB — BASIC METABOLIC PANEL
BUN: 29 mg/dL — ABNORMAL HIGH (ref 6–23)
Chloride: 96 mEq/L (ref 96–112)
GFR calc Af Amer: 55 mL/min — ABNORMAL LOW (ref >90)
GFR calc non Af Amer: 48 mL/min — ABNORMAL LOW (ref >90)
Glucose, Bld: 134 mg/dL — ABNORMAL HIGH (ref 70–99)
Potassium: 3.7 mEq/L (ref 3.5–5.1)
Sodium: 137 mEq/L (ref 135–145)

## 2011-12-06 LAB — PRO B NATRIURETIC PEPTIDE: Pro B Natriuretic peptide (BNP): 928.6 pg/mL — ABNORMAL HIGH (ref 0–125)

## 2011-12-06 MED ORDER — ALBUTEROL SULFATE HFA 108 (90 BASE) MCG/ACT IN AERS
2.0000 | INHALATION_SPRAY | RESPIRATORY_TRACT | Status: DC
  Administered 2011-12-06: 2 via RESPIRATORY_TRACT
  Filled 2011-12-06: qty 6.7

## 2011-12-06 MED ORDER — ALBUTEROL SULFATE (5 MG/ML) 0.5% IN NEBU
5.0000 mg | INHALATION_SOLUTION | Freq: Once | RESPIRATORY_TRACT | Status: AC
  Administered 2011-12-06: 5 mg via RESPIRATORY_TRACT
  Filled 2011-12-06: qty 1

## 2011-12-06 NOTE — ED Provider Notes (Signed)
History     CSN: [id number]  Arrival date & time 12/05/11  2154   First MD Initiated Contact with Patient 12/05/11 2352      Chief Complaint  Patient presents with  . Shortness of Breath   HPI The patient has had approximately one week of shortness of breath.  He's been seen by his cardiologist who follows him closely for his history of coronary artery disease and congestive heart failure.  He was last admitted for acute on chronic systolic heart failure approximately 2 weeks ago.  He's been compliant with his Lasix.  The patient reports he saw his cardiologist this week and had a VQ scan as well as a stress test as well as chest x-ray all of which were normal per the patient and family.  The patient continues to have dyspnea.  Nothing worsens the symptoms.  Nothing improves his symptoms.  He has no new dyspnea on exertion.  He has no new orthopnea.  His lower extremity edema is baseline for him.  He has no history of asthma or COPD.  He denies fevers or chills.  He denies productive cough.  He has no prior smoking history.  Nothing worsens the symptoms.  Nothing improves his symptoms.  Symptoms are constant.  The patient and his wife agree that his symptoms are the same ones that he is presented to his cardiologist with   Past Medical History  Diagnosis Date  . CORONARY ARTERY DISEASE 03/11/2007    s/p CABG 1998;  admitted 08/2011 with an inferior STEMI.  LHC 09/07/11: Severe  Three-vessel CAD, LIMA-LAD patent, SVG-RCA chronically occluded, SVG-circumflex chronically occluded, SVG-diagonal 95%.  PCI: Promus DES to the SVG-diagonal.  Procedure complicated by transient complete heart block requiring CPR for 20 seconds and temporary transvenous pacemaker  . DIABETES MELLITUS, TYPE I 03/11/2007  . HYPERTENSION 03/11/2007  . BLINDNESS 05/21/2008  . Proliferative diabetic retinopathy 03/09/2009  . HYPERCHOLESTEROLEMIA 09/28/2008  . CERVICAL RADICULOPATHY, LEFT 09/23/2008  . ANEMIA-IRON DEFICIENCY 09/23/2008    . DEPRESSION 09/23/2008  . PERIPHERAL NEUROPATHY 09/23/2008  . Chronic systolic heart failure 09/23/2008    Echocardiogram 09/07/11: Difficult study, inferior and posterior lateral wall hypokinesis, mild LVH, EF 45%, mild LAE.   [name] RENAL INSUFFICIENCY 03/15/2010  . CEREBROVASCULAR ACCIDENT, HX OF 09/23/2008  . PERIPHERAL VASCULAR INSUFFICIENCY,  LEGS, BILATERAL 08/17/2010    ABI's performed 04/19/11 R 0.72 L 0.71  . ED (erectile dysfunction)   . Charcot foot due to diabetes mellitus     chronic pain  . Hypogonadism male   . GI bleed 2012    Multiple Duodenal ulcer   . Osteoarthritis   . Obesity     Past Surgical History  Procedure Date  . Coronary artery bypass graft   . Carpal tunnel release     left  . Wrist surgery     s/p right-bone graft fusion  . Spine surgery 2010    C spine surgery for disc Dr. Yetta Barre  . Sigmoidoscopy 03/11/2001  . Venous doppler 01/30/2004  . Cataract surgery   . Ankle surgery     left  . Foot surgery     left  . Cardiac catheterization   . Eye surgery   . Fracture surgery     Family History  Problem Relation Age of Onset  . Heart disease Mother     Coronary Artery Disease  . Heart disease Father     Coronary Artery Disease  . Heart disease Paternal Grandmother  Coronary Artery Disease  . Colon cancer Neg Hx   . Diabetes Other     Grandmother    History  Substance Use Topics  . Smoking status: Never Smoker   . Smokeless tobacco: Never Used  . Alcohol Use: No      Review of Systems  All other systems reviewed and are negative.    Allergies  Contrast media and Sildenafil  Home Medications   Current Outpatient Rx  Name Route Sig Dispense Refill  . ASPIRIN 81 MG PO CHEW Oral Chew 81 mg by mouth daily.    [name] CARVEDILOL 6.25 MG PO TABS Oral Take 6.25 mg by mouth 2 (two) times daily with a meal.    . CLOPIDOGREL BISULFATE 75 MG PO TABS Oral Take 75 mg by mouth daily with breakfast.    . CLOTRIMAZOLE-BETAMETHASONE 1-0.05 % EX CREA  Topical Apply 1 application topically 2 (two) times daily as needed. For rash    . DOCUSATE SODIUM 100 MG PO TABS Oral Take 100 mg by mouth daily as needed. constipation    . FUROSEMIDE 80 MG PO TABS Oral Take 80 mg by mouth 2 (two) times daily. May take an extra 80 mg as needed    . GABAPENTIN 300 MG PO CAPS Oral Take 300 mg by mouth 2 (two) times daily.    [name] HYDROCODONE-ACETAMINOPHEN 10-325 MG PO TABS Oral Take 1-2 tablets by mouth every 4 (four) hours as needed. For pain    . INSULIN ASPART 100 UNIT/ML [place] SOLN Subcutaneous Inject 15 Units into the skin 3 (three) times daily with meals.    . INSULIN ISOPHANE HUMAN 100 UNIT/ML [place] SUSP Subcutaneous Inject 30 Units into the skin daily before breakfast.     . LOSARTAN POTASSIUM 50 MG PO TABS Oral Take 50 mg by mouth daily.    [name] OMEPRAZOLE 20 MG PO CPDR Oral Take 20 mg by mouth daily.    [name] POTASSIUM CHLORIDE CRYS ER 20 MEQ PO TBCR Oral Take 20 mEq by mouth daily.    [name] ROSUVASTATIN CALCIUM 20 MG PO TABS Oral Take 20 mg by mouth daily.    [name] SPIRONOLACTONE 25 MG PO TABS Oral Take 12.5 mg by mouth daily.    [name] TAMSULOSIN HCL 0.4 MG PO CAPS Oral Take 0.4 mg by mouth daily.    [name] NITROGLYCERIN 0.4 MG SL SUBL Sublingual Place 1 tablet (0.4 mg total) under the tongue every 5 (five) minutes as needed. For chest pain 25 tablet 3    BP 113/48  Pulse 63  Temp 97.5 F (36.4 C)  Resp 20  SpO2 97%  Physical Exam  Nursing note and vitals reviewed. Constitutional: He is oriented to person, place, and time. He appears well-developed and well-nourished.  HENT:  Head: Normocephalic and atraumatic.  Eyes: EOM are normal.  Neck: Normal range of motion.  Cardiovascular: Normal rate, regular rhythm, normal heart sounds and intact distal pulses.   Pulmonary/Chest: Effort normal and breath sounds normal. No respiratory distress.  Abdominal: Soft. He exhibits no distension. There is no tenderness.  Musculoskeletal: Normal range of motion.       Trace edema in his  bilateral lower extremities.  Evidence of chronic venous insufficiency with discoloration of skin.  Neurological: He is alert and oriented to person, place, and time.  Skin: Skin is warm and dry.  Psychiatric: He has a normal mood and affect. Judgment normal.    ED Course  Procedures (including critical care time)  Date: 12/06/2011  Rate: 62  Rhythm: normal sinus rhythm  QRS Axis: normal  Intervals: Right bundle branch block  ST/T Wave abnormalities: normal  Conduction Disutrbances: none  Narrative Interpretation:   Old EKG Reviewed: No significant changes noted    Labs Reviewed  CBC - Abnormal; Notable for the following:    Hemoglobin 12.5 (*)    All other components within normal limits  BASIC METABOLIC PANEL - Abnormal; Notable for the following:    Glucose, Bld 134 (*)    BUN 29 (*)    Creatinine, Ser 1.46 (*)    GFR calc non Af Amer 48 (*)    GFR calc Af Amer 55 (*)    All other components within normal limits  PRO B NATRIURETIC PEPTIDE - Abnormal; Notable for the following:    Pro B Natriuretic peptide (BNP) 928.6 (*)    All other components within normal limits  TROPONIN I   Dg Chest 2 View  12/06/2011  *RADIOLOGY REPORT*  Clinical Data: Persistent shortness of breath  CHEST - 2 VIEW  Comparison: 12/01/2011; 11/19/2011; VQ scan - 12/01/2011  Findings: Grossly unchanged enlarged cardiac silhouette and mediastinal contours post median sternotomy and CABG.  Overall improved aeration of the bilateral lower lungs with persistent minimal bibasilar heterogeneous opacities, left greater than right. No pleural effusion or pneumothorax.  Grossly unchanged bones including lower cervical ACDF, incompletely imaged.  IMPRESSION:  1.  Stable findings of cardiomegaly without evidence of pulmonary edema. 2.  Improved aeration of the bilateral lower lungs with persistent bibasilar opacities favored to represent atelectasis.  Original Report Authenticated By: Waynard Reeds, M.D.      1. Dyspnea       MDM  The patient has had improvement in his symptoms with albuterol in the emergency department.  His EKG is unchanged.  He reports his weight is down approximately 4 pounds and therefore this is unlikely to be congestive heart failure.  He has no pulmonary edema on his chest x-ray.  He's had a recent significant workup by his cardiologist including a stress test and VQ scan as well as a chest x-ray within the last week.  His symptoms are not consistent with pneumonia.  I suspect this is atelectasis on his chest x-ray.  The patient may benefit from followup with a pulmonologist given his improvement in his breathing with albuterol.  The patient be sent home with albuterol inhaler to be used when necessary.  The patient and his family understand to return the emergency department for new or worsening symptoms.  They will continue to follow his weights closely.  They have been instructed by the cardiologist to take an additional 36 [place] oral Lasix as needed.          Lyanne Co, MD 12/06/11 [phone]

## 2011-12-06 NOTE — ED Notes (Signed)
Pt states improvement in sob after breathing tx.  Lung sounds remain clear.

## 2011-12-08 ENCOUNTER — Other Ambulatory Visit: Payer: Medicare Other

## 2011-12-11 ENCOUNTER — Encounter: Payer: Self-pay | Admitting: Physician Assistant

## 2011-12-11 ENCOUNTER — Other Ambulatory Visit: Payer: Medicare Other

## 2011-12-11 ENCOUNTER — Ambulatory Visit (INDEPENDENT_AMBULATORY_CARE_PROVIDER_SITE_OTHER): Payer: Medicare Other | Admitting: Physician Assistant

## 2011-12-11 VITALS — BP 110/52 | HR 63 | Ht 68.0 in | Wt 226.0 lb

## 2011-12-11 DIAGNOSIS — I5022 Chronic systolic (congestive) heart failure: Secondary | ICD-10-CM

## 2011-12-11 DIAGNOSIS — R0602 Shortness of breath: Secondary | ICD-10-CM

## 2011-12-11 DIAGNOSIS — I255 Ischemic cardiomyopathy: Secondary | ICD-10-CM

## 2011-12-11 DIAGNOSIS — I25810 Atherosclerosis of coronary artery bypass graft(s) without angina pectoris: Secondary | ICD-10-CM

## 2011-12-11 DIAGNOSIS — L97909 Non-pressure chronic ulcer of unspecified part of unspecified lower leg with unspecified severity: Secondary | ICD-10-CM

## 2011-12-11 DIAGNOSIS — I2589 Other forms of chronic ischemic heart disease: Secondary | ICD-10-CM

## 2011-12-11 DIAGNOSIS — N259 Disorder resulting from impaired renal tubular function, unspecified: Secondary | ICD-10-CM

## 2011-12-11 DIAGNOSIS — I10 Essential (primary) hypertension: Secondary | ICD-10-CM

## 2011-12-11 NOTE — Patient Instructions (Addendum)
On Monday Wednesday and Fridays Take Lasix 120mg  in the AM and 80mg  in the PM All other days take Lasix 80mg  in the am  And 80mg  in the PM.  Lab work in 1 week ( BMET)  No lab work today  Limit fluid to 50 to 60 OZ. Per day  Will refer you to [facility] for heart failure and wound care.Sodium-Controlled Diet Sodium is a mineral. It is found in many foods. Sodium may be found naturally or added during the making of a food. The most common form of sodium is salt, which is made up of sodium and chloride. Reducing your sodium intake involves changing your eating habits. The following guidelines will help you reduce the sodium in your diet:  Stop using the salt shaker.   Use salt sparingly in cooking and baking.   Substitute with sodium-free seasonings and spices.   Do not use a salt substitute (potassium chloride) without your caregiver's permission.   Include a variety of fresh, unprocessed foods in your diet.   Limit the use of processed and convenience foods that are high in sodium.  USE THE FOLLOWING FOODS SPARINGLY: Breads/Starches  Commercial bread stuffing, commercial pancake or waffle mixes, coating mixes. Waffles. Croutons. Prepared (boxed or frozen) potato, rice, or noodle mixes that contain salt or sodium. Salted [place] fries or hash browns. Salted popcorn, breads, crackers, chips, or snack foods.  Vegetables  Vegetables canned with salt or prepared in cream, butter, or cheese sauces. Sauerkraut. Tomato or vegetable juices canned with salt.   Fresh vegetables are allowed if rinsed thoroughly.  Fruit  Fruit is okay to eat.  Meat and Meat Substitutes  Salted or smoked meats, such as bacon or Canadian bacon, chipped or corned beef, hot dogs, salt pork, luncheon meats, pastrami, ham, or sausage. Canned or smoked fish, poultry, or meat. Processed cheese or cheese spreads, blue or [place] cheese. Battered or frozen fish products. Prepared spaghetti sauce. Baked beans.  Reuben sandwiches. Salted nuts. Caviar.  Milk  Limit buttermilk to 1 cup per week.  Soups and Combination Foods  Bouillon cubes, canned or dried soups, broth, consomm. Convenience (frozen or packaged) dinners with more than 600 mg sodium. Pot pies, pizza, Asian food, fast food cheeseburgers, and specialty sandwiches.  Desserts and Sweets  Regular (salted) desserts, pie, commercial fruit snack pies, commercial snack cakes, canned puddings.   Eat desserts and sweets in moderation.  Fats and Oils  Gravy mixes or canned gravy. No more than 1 to 2 tbs of salad dressing. Chip dips.   Eat fats and oils in moderation.  Beverages  See those listed under the vegetables and milk groups.  Condiments  Ketchup, mustard, meat sauces, salsa, regular (salted) and lite soy sauce or mustard. Dill pickles, olives, meat tenderizer. Prepared horseradish or pickle relish. Dutch-processed cocoa. Baking powder or baking soda used medicinally. Worcestershire sauce. "Light" salt. Salt substitute, unless approved by your caregiver.  Document Released: 01/13/2002 Document Revised: 07/13/2011 Document Reviewed: 08/16/2009 [facility] Patient Information 2012 [place], [place].

## 2011-12-11 NOTE — Progress Notes (Signed)
[address] [place], [place]  [address] Phone: [phone] Fax:  [phone]  Date:  12/11/2011   Name:  Mark French    DOB:  [birth year]   MRN:  [id number]  PCP:  [name]  Primary Cardiologist:  Dr. Tonny Bollman  Primary Electrophysiologist:  None    History of Present Illness: Mark French is a 66 y.o. male who presents for follow up.    He has a history of CAD, status post CABG in 1998, PAD, DM2, HTN, venous insufficiency, hyperlipidemia and prior upper GI bleed as well as a history of CVA at the age of 2 months.  He was admitted 08/2011 with an inferior STEMI.  LHC 09/07/11: Severe  Three-vessel CAD, LIMA-LAD patent, SVG-RCA chronically occluded, SVG-circumflex chronically occluded, SVG-diagonal 95%.  PCI: Promus DES to the SVG-diagonal.  Procedure complicated by transient complete heart block requiring CPR for 20 seconds and temporary transvenous pacemaker.  Echocardiogram 09/07/11: Difficult study, inferior and posterior lateral wall hypokinesis, mild LVH, EF 45%, mild LAE.  The patient had evidence of volume overload.  He was diuresed.  Discharged to [facility] nursing home.  He has chronic lower extremity edema.    Admitted 11/2011 with a/c systolic CHF.    I saw him 4/25 with complaints of shortness of breath.  BNP was slightly elevated.  I had him take extra Lasix for 3 days.  D-dimer was also abnormal.  VQ scan was negative for pulmonary embolism.  I arranged a Myoview scan.  This was performed 12/04/11.  It demonstrated large severe inferolateral infarct, no ischemia, EF 25%.  I discussed his nuclear scan with Dr. Excell Seltzer.  We felt that his ejection fraction was likely overestimated on echocardiogram.  We will pursue medical therapy for his cardiomyopathy.  His reduced ejection fraction likely explains a lot of his symptoms.  He did go to the emergency room 4/30 with shortness of breath.  EKG demonstrated no evidence of pulmonary edema and bilateral lower  lobe atelectasis.  He was given albuterol in the emergency room with improvement in his symptoms.  He was discharged to home with albuterol.  Of note, troponin was negative.  BNP was slightly elevated from previous at 928.  Renal function and hemoglobin were both stable.  Doing ok today.  Took extra Lasix last night due to increased dyspnea.  Sleeps in recliner chronically.  No chest pain.  No syncope.  Using Albuterol with spurious results.  No h/o asthma or COPD.  No h/o smoking.  Weights at home ok.  Still has not heard from home health.     Wt Readings from Last 3 Encounters:  12/11/11 226 lb (102.513 kg)  12/04/11 227 lb (102.967 kg)  11/30/11 230 lb (104.327 kg)     Potassium  Date/Time Value Range Status  12/05/2011 11:50 PM 3.7  3.5-5.1 (mEq/L) Final  11/29/2011  7:36 AM 4.2  3.5-5.1 (mEq/L) Final     Creatinine, Ser  Date/Time Value Range Status  12/05/2011 11:50 PM 1.46* 0.50-1.35 (mg/dL) Final  7/56/4332  9:51 AM 1.5  0.4-1.5 (mg/dL) Final     Pro B Natriuretic peptide (BNP)  Date/Time Value Range Status  12/06/2011  1:05 AM 928.6* 0-125 (pg/mL) Final  11/30/2011  1:16 PM 207.0* 0.0-100.0 (pg/mL) Final     Past Medical History  Diagnosis Date  . CORONARY ARTERY DISEASE 03/11/2007    s/p CABG 1998;  admitted 08/2011 with an inferior STEMI.  LHC 09/07/11: Severe  Three-vessel  CAD, LIMA-LAD patent, SVG-RCA chronically occluded, SVG-circumflex chronically occluded, SVG-diagonal 95%.  PCI: Promus DES to the SVG-diagonal.  Procedure c/b transient CHB req CPR and TTVP;  myoview 4/13:large severe inferolateral infarct, no ischemia, EF 25%    . DIABETES MELLITUS, TYPE I 03/11/2007  . HYPERTENSION 03/11/2007  . BLINDNESS 05/21/2008  . Proliferative diabetic retinopathy 03/09/2009  . HYPERCHOLESTEROLEMIA 09/28/2008  . CERVICAL RADICULOPATHY, LEFT 09/23/2008  . ANEMIA-IRON DEFICIENCY 09/23/2008  . DEPRESSION 09/23/2008  . PERIPHERAL NEUROPATHY 09/23/2008  . Chronic systolic heart failure  09/23/2008    Echocardiogram 09/07/11: Difficult study, inferior and posterior lateral wall hypokinesis, mild LVH, EF 45%, mild LAE. ;  myoview 12/04/11:  large severe inferolateral infarct, no ischemia, EF 25%  . RENAL INSUFFICIENCY 03/15/2010  . CEREBROVASCULAR ACCIDENT, HX OF 09/23/2008  . PERIPHERAL VASCULAR INSUFFICIENCY,  LEGS, BILATERAL 08/17/2010    ABI's performed 04/19/11 R 0.72 L 0.71  . ED (erectile dysfunction)   . Charcot foot due to diabetes mellitus     chronic pain  . Hypogonadism male   . GI bleed 2012    Multiple Duodenal ulcer   . Osteoarthritis   . Obesity     Current Outpatient Prescriptions  Medication Sig Dispense Refill  . aspirin 81 MG chewable tablet Chew 81 mg by mouth daily.      . carvedilol (COREG) 6.25 MG tablet Take 6.25 mg by mouth 2 (two) times daily with a meal.      . clopidogrel (PLAVIX) 75 MG tablet Take 75 mg by mouth daily with breakfast.      . clotrimazole-betamethasone (LOTRISONE) cream Apply 1 application topically 2 (two) times daily as needed. For rash      . Docusate Sodium (STOOL SOFTENER) 100 MG capsule Take 100 mg by mouth daily as needed. constipation      . furosemide (LASIX) 80 MG tablet Take 80 mg by mouth 2 (two) times daily. May take an extra 80 mg as needed      . gabapentin (NEURONTIN) 300 MG capsule Take 300 mg by mouth 2 (two) times daily.      [name] HYDROcodone-acetaminophen (NORCO) 10-325 MG per tablet Take 1-2 tablets by mouth every 4 (four) hours as needed. For pain      . insulin aspart (NOVOLOG) 100 UNIT/ML injection Inject 15 Units into the skin 3 (three) times daily with meals.      . insulin NPH (HUMULIN N,NOVOLIN N) 100 UNIT/ML injection Inject 30 Units into the skin daily before breakfast.       . losartan (COZAAR) 50 MG tablet Take 50 mg by mouth daily.      . nitroGLYCERIN (NITROSTAT) 0.4 MG SL tablet Place 1 tablet (0.4 mg total) under the tongue every 5 (five) minutes as needed. For chest pain  25 tablet  3  . omeprazole  (PRILOSEC) 20 MG capsule Take 20 mg by mouth daily.      . potassium chloride SA (K-DUR,KLOR-CON) 20 MEQ tablet Take 20 mEq by mouth daily.      . rosuvastatin (CRESTOR) 20 MG tablet Take 20 mg by mouth daily.      [name] spironolactone (ALDACTONE) 25 MG tablet Take 12.5 mg by mouth daily.      . Tamsulosin HCl (FLOMAX) 0.4 MG CAPS Take 0.4 mg by mouth daily.      [name] DISCONTD: gabapentin (NEURONTIN) 300 MG capsule Take 1 capsule (300 mg total) by mouth at bedtime.  30 capsule  11    Allergies: Allergies  Allergen Reactions  . Contrast Media (Iodinated Diagnostic Agents) Nausea Only  . Sildenafil Nausea Only and Other (See Comments)    History  Substance Use Topics  . Smoking status: Never Smoker   . Smokeless tobacco: Never Used  . Alcohol Use: No     ROS:  Please see the history of present illness.    All other systems reviewed and negative.   PHYSICAL EXAM: VS:  BP 110/52  Pulse 63  Ht 5\' 8"  (1.727 m)  Wt 226 lb (102.513 kg)  BMI 34.36 kg/m2   Well nourished, well developed, in no acute distress HEENT: normal Neck: no JVD at 90 Degrees Cardiac:  normal S1, S2; RRR; no murmur Lungs:  clear to auscultation bilaterally, no wheezing, rhonchi or rales Abd: soft, nontender Ext:  Tight 1+ brawny bilateral LE edema Skin: warm and dry Neuro:  CNs 2-12 intact, no focal abnormalities noted Psych:  Tearful when we discussed his EF from his nuclear study   EKG:  NSR, HR 63, LAD, RBBB, no change from prior  ASSESSMENT AND PLAN:  1. Chronic Systolic CHF Still having difficulty with dyspnea.  We had a long discussion re: his EF.  He is quite depressed about this.  I will adjust his Lasix to 120 mg in the AM and 80 mg in the PM on MWF and continue 80 mg BID all other days.  Check BMET in one week.  We discussed the importance of NaCl restriction (I suspect his salt load is high) and to keep fluids at 50-60 oz per day.  Follow up with Dr. Tonny Bollman in 3-4 weeks.  2. Ischemic  Cardiomyopathy Continue Coreg, Cozaar, Aldactone.  No room in BP to adjust further.  Previously d/w Dr. Excell Seltzer.  He is not a candidate for ICD.  3. CAD Stable.  Continue dual antiplatelet Rx, statin.  4. Chronic Kidney Disease Keep close eye on renal fxn and K+  5. Leg Ulcer Will try again to get home health arranged for wound care and for CHF.  6. HTN Controlled.  Continue current therapy.   7.  Dyspnea Not certain he needs to see pulmonary.  Suspect his dyspnea is mainly related to CHF.  Offered referral to pulmonary but he prefers to see PCP first.     Signed, Tereso Newcomer, PA-C  10:00 AM 12/11/2011

## 2011-12-13 ENCOUNTER — Telehealth: Payer: Self-pay

## 2011-12-13 MED ORDER — ALBUTEROL SULFATE HFA 108 (90 BASE) MCG/ACT IN AERS: 2.0000 | INHALATION_SPRAY | Freq: Four times a day (QID) | RESPIRATORY_TRACT | Status: DC | PRN

## 2011-12-13 NOTE — Telephone Encounter (Signed)
Spouse advised of Rx/pharmacy.

## 2011-12-13 NOTE — Telephone Encounter (Signed)
rx sent

## 2011-12-13 NOTE — Telephone Encounter (Signed)
Pt's spouse called requesting Rx for sample albuterol inhaler pt was given at ED 04/30 for SOB.

## 2011-12-14 ENCOUNTER — Other Ambulatory Visit: Payer: Self-pay

## 2011-12-14 ENCOUNTER — Emergency Department (HOSPITAL_COMMUNITY): Payer: Medicare Other

## 2011-12-14 ENCOUNTER — Other Ambulatory Visit: Payer: Self-pay | Admitting: Endocrinology

## 2011-12-14 ENCOUNTER — Emergency Department (HOSPITAL_COMMUNITY)
Admission: EM | Admit: 2011-12-14 | Discharge: 2011-12-14 | Disposition: A | Discharge status: Home / Self Care | Payer: Medicare Other | Attending: Emergency Medicine | Admitting: Emergency Medicine

## 2011-12-14 ENCOUNTER — Encounter (HOSPITAL_COMMUNITY): Payer: Self-pay

## 2011-12-14 DIAGNOSIS — E7800 Pure hypercholesterolemia, unspecified: Secondary | ICD-10-CM | POA: Insufficient documentation

## 2011-12-14 DIAGNOSIS — Z79899 Other long term (current) drug therapy: Secondary | ICD-10-CM | POA: Insufficient documentation

## 2011-12-14 DIAGNOSIS — R0602 Shortness of breath: Secondary | ICD-10-CM

## 2011-12-14 DIAGNOSIS — I10 Essential (primary) hypertension: Secondary | ICD-10-CM | POA: Insufficient documentation

## 2011-12-14 DIAGNOSIS — I5022 Chronic systolic (congestive) heart failure: Secondary | ICD-10-CM | POA: Insufficient documentation

## 2011-12-14 DIAGNOSIS — M1990 Unspecified osteoarthritis, unspecified site: Secondary | ICD-10-CM | POA: Insufficient documentation

## 2011-12-14 DIAGNOSIS — Z794 Long term (current) use of insulin: Secondary | ICD-10-CM | POA: Insufficient documentation

## 2011-12-14 DIAGNOSIS — F329 Major depressive disorder, single episode, unspecified: Secondary | ICD-10-CM | POA: Insufficient documentation

## 2011-12-14 DIAGNOSIS — Z7982 Long term (current) use of aspirin: Secondary | ICD-10-CM | POA: Insufficient documentation

## 2011-12-14 DIAGNOSIS — I2510 Atherosclerotic heart disease of native coronary artery without angina pectoris: Secondary | ICD-10-CM | POA: Insufficient documentation

## 2011-12-14 DIAGNOSIS — Z8673 Personal history of transient ischemic attack (TIA), and cerebral infarction without residual deficits: Secondary | ICD-10-CM | POA: Insufficient documentation

## 2011-12-14 DIAGNOSIS — F3289 Other specified depressive episodes: Secondary | ICD-10-CM | POA: Insufficient documentation

## 2011-12-14 DIAGNOSIS — E109 Type 1 diabetes mellitus without complications: Secondary | ICD-10-CM | POA: Insufficient documentation

## 2011-12-14 LAB — URINALYSIS, ROUTINE W REFLEX MICROSCOPIC
Bilirubin Urine: NEGATIVE
Glucose, UA: NEGATIVE mg/dL
Hgb urine dipstick: NEGATIVE
Ketones, ur: NEGATIVE mg/dL
Leukocytes, UA: NEGATIVE
Nitrite: NEGATIVE
Protein, ur: NEGATIVE mg/dL
Specific Gravity, Urine: 1.009 (ref 1.005–1.030)
Urobilinogen, UA: 0.2 mg/dL (ref 0.0–1.0)
pH: 6.5 (ref 5.0–8.0)

## 2011-12-14 LAB — CBC
HCT: 38.8 % — ABNORMAL LOW (ref 39.0–52.0)
Hemoglobin: 12.6 g/dL — ABNORMAL LOW (ref 13.0–17.0)
MCH: 27.8 pg (ref 26.0–34.0)
MCHC: 32.5 g/dL (ref 30.0–36.0)
MCV: 85.5 fL (ref 78.0–100.0)
Platelets: 167 10*3/uL (ref 150–400)
RBC: 4.54 MIL/uL (ref 4.22–5.81)
RDW: 14.2 % (ref 11.5–15.5)
WBC: 7.6 10*3/uL (ref 4.0–10.5)

## 2011-12-14 LAB — BASIC METABOLIC PANEL
BUN: 37 mg/dL — ABNORMAL HIGH (ref 6–23)
CO2: 27 mEq/L (ref 19–32)
Calcium: 9.3 mg/dL (ref 8.4–10.5)
Chloride: 95 mEq/L — ABNORMAL LOW (ref 96–112)
Creatinine, Ser: 1.54 mg/dL — ABNORMAL HIGH (ref 0.50–1.35)
GFR calc Af Amer: 52 mL/min — ABNORMAL LOW (ref >90)
GFR calc non Af Amer: 45 mL/min — ABNORMAL LOW (ref >90)
Glucose, Bld: 183 mg/dL — ABNORMAL HIGH (ref 70–99)
Potassium: 4 mEq/L (ref 3.5–5.1)
Sodium: 134 mEq/L — ABNORMAL LOW (ref 135–145)

## 2011-12-14 LAB — DIFFERENTIAL
Basophils Absolute: 0 10*3/uL (ref 0.0–0.1)
Basophils Relative: 0 % (ref 0–1)
Eosinophils Absolute: 0.4 10*3/uL (ref 0.0–0.7)
Eosinophils Relative: 5 % (ref 0–5)
Lymphocytes Relative: 14 % (ref 12–46)
Lymphs Abs: 1 10*3/uL (ref 0.7–4.0)
Monocytes Absolute: 0.9 10*3/uL (ref 0.1–1.0)
Monocytes Relative: 12 % (ref 3–12)
Neutro Abs: 5.3 10*3/uL (ref 1.7–7.7)
Neutrophils Relative %: 70 % (ref 43–77)

## 2011-12-14 LAB — TROPONIN I: Troponin I: <0.3 ng/mL (ref <0.30)

## 2011-12-14 LAB — PRO B NATRIURETIC PEPTIDE: Pro B Natriuretic peptide (BNP): 798.6 pg/mL — ABNORMAL HIGH (ref 0–125)

## 2011-12-14 MED ORDER — ALBUTEROL SULFATE HFA 108 (90 BASE) MCG/ACT IN AERS
2.0000 | INHALATION_SPRAY | Freq: Four times a day (QID) | RESPIRATORY_TRACT | Status: DC
  Administered 2011-12-14: 2 via RESPIRATORY_TRACT
  Filled 2011-12-14: qty 6.7

## 2011-12-14 MED ORDER — GLUCOSE BLOOD VI DISK: 1.0000 | DISK | Freq: Four times a day (QID) | Status: DC

## 2011-12-14 NOTE — ED Notes (Signed)
Paged Dr. Arthur Holms to [phone]

## 2011-12-14 NOTE — ED Provider Notes (Signed)
History     CSN: [id number]  Arrival date & time 12/14/11  1409   First MD Initiated Contact with Patient 12/14/11 1503      Chief Complaint  Patient presents with  . Shortness of Breath    (Consider location/radiation/quality/duration/timing/severity/associated sxs/prior treatment) HPI Patient presents the emergency room with continued complaints of shortness of breath.  Patient states that he has had shortness of breath has been ongoing for quite a while.  He states last night it started bothering him more.  Patient states that he had an inhaler in the emergency department that seemed to help previously.  Patient denies chest pain, nausea, vomiting, abdominal pain, fever, cough, sore throat, difficulty swallowing or back pain.  Patient states that nothing seems to make it better or worse.  Patient states that he was dealing with this issue with his doctor and they were considering referring him to a pulmonary specialist.  Past Medical History  Diagnosis Date  . CORONARY ARTERY DISEASE 03/11/2007    s/p CABG 1998;  admitted 08/2011 with an inferior STEMI.  LHC 09/07/11: Severe  Three-vessel CAD, LIMA-LAD patent, SVG-RCA chronically occluded, SVG-circumflex chronically occluded, SVG-diagonal 95%.  PCI: Promus DES to the SVG-diagonal.  Procedure c/b transient CHB req CPR and TTVP;  myoview 4/13:large severe inferolateral infarct, no ischemia, EF 25%    . DIABETES MELLITUS, TYPE I 03/11/2007  . HYPERTENSION 03/11/2007  . BLINDNESS 05/21/2008  . Proliferative diabetic retinopathy 03/09/2009  . HYPERCHOLESTEROLEMIA 09/28/2008  . CERVICAL RADICULOPATHY, LEFT 09/23/2008  . ANEMIA-IRON DEFICIENCY 09/23/2008  . DEPRESSION 09/23/2008  . PERIPHERAL NEUROPATHY 09/23/2008  . Chronic systolic heart failure 09/23/2008    Echocardiogram 09/07/11: Difficult study, inferior and posterior lateral wall hypokinesis, mild LVH, EF 45%, mild LAE. ;  myoview 12/04/11:  large severe inferolateral infarct, no ischemia, EF 25%  .  RENAL INSUFFICIENCY 03/15/2010  . CEREBROVASCULAR ACCIDENT, HX OF 09/23/2008  . PERIPHERAL VASCULAR INSUFFICIENCY,  LEGS, BILATERAL 08/17/2010    ABI's performed 04/19/11 R 0.72 L 0.71  . ED (erectile dysfunction)   . Charcot foot due to diabetes mellitus     chronic pain  . Hypogonadism male   . GI bleed 2012    Multiple Duodenal ulcer   . Osteoarthritis   . Obesity     Past Surgical History  Procedure Date  . Coronary artery bypass graft   . Carpal tunnel release     left  . Wrist surgery     s/p right-bone graft fusion  . Spine surgery 2010    C spine surgery for disc Dr. Yetta Barre  . Sigmoidoscopy 03/11/2001  . Venous doppler 01/30/2004  . Cataract surgery   . Ankle surgery     left  . Foot surgery     left  . Cardiac catheterization   . Eye surgery   . Fracture surgery     Family History  Problem Relation Age of Onset  . Heart disease Mother     Coronary Artery Disease  . Heart disease Father     Coronary Artery Disease  . Heart disease Paternal Grandmother     Coronary Artery Disease  . Colon cancer Neg Hx   . Diabetes Other     Grandmother    History  Substance Use Topics  . Smoking status: Never Smoker   . Smokeless tobacco: Never Used  . Alcohol Use: No      Review of Systems All other systems negative except as documented in the HPI. All pertinent  positives and negatives as reviewed in the HPI.  Allergies  Contrast media and Sildenafil  Home Medications   Current Outpatient Rx  Name Route Sig Dispense Refill  . ALBUTEROL SULFATE HFA 108 (90 BASE) MCG/ACT IN AERS Inhalation Inhale 2 puffs into the lungs every 6 (six) hours as needed for wheezing. 1 Inhaler 0  . ASPIRIN 81 MG PO CHEW Oral Chew 81 mg by mouth daily.    [name] CARVEDILOL 6.25 MG PO TABS Oral Take 6.25 mg by mouth 2 (two) times daily with a meal.    . CLOPIDOGREL BISULFATE 75 MG PO TABS Oral Take 75 mg by mouth daily with breakfast.    . CLOTRIMAZOLE-BETAMETHASONE 1-0.05 % EX CREA Topical  Apply 1 application topically 2 (two) times daily as needed. For rash    . DOCUSATE SODIUM 100 MG PO TABS Oral Take 100 mg by mouth daily as needed. constipation    . FUROSEMIDE 80 MG PO TABS Oral Take 80 mg by mouth 2 (two) times daily. May take an extra 80 mg as needed    . GABAPENTIN 300 MG PO CAPS Oral Take 300 mg by mouth 2 (two) times daily.    [name] HYDROCODONE-ACETAMINOPHEN 10-325 MG PO TABS Oral Take 1-2 tablets by mouth every 4 (four) hours as needed. For pain    . INSULIN ASPART 100 UNIT/ML [place] SOLN Subcutaneous Inject 15 Units into the skin 3 (three) times daily with meals.    . INSULIN ISOPHANE HUMAN 100 UNIT/ML [place]ults SUSP Subcutaneous Inject 30 Units into the skin daily before breakfast.     . LOSARTAN POTASSIUM 50 MG PO TABS Oral Take 50 mg by mouth daily.    [name] NITROGLYCERIN 0.4 MG SL SUBL Sublingual Place 1 tablet (0.4 mg total) under the tongue every 5 (five) minutes as needed. For chest pain 25 tablet 3  . OMEPRAZOLE 20 MG PO CPDR Oral Take 20 mg by mouth daily.    [name] POTASSIUM CHLORIDE CRYS ER 20 MEQ PO TBCR Oral Take 20 mEq by mouth daily.    [name] ROSUVASTATIN CALCIUM 20 MG PO TABS Oral Take 20 mg by mouth daily.    [name] SPIRONOLACTONE 25 MG PO TABS Oral Take 12.5 mg by mouth daily.    [name] TAMSULOSIN HCL 0.4 MG PO CAPS Oral Take 0.4 mg by mouth daily.      BP 126/60  Pulse 78  Temp(Src) 98 F (36.7 C) (Oral)  Resp 19  SpO2 97%  Physical Exam Physical Examination: General appearance - alert, well appearing, and in no distress, oriented to person, place, and time and normal appearing weight Mental status - alert, oriented to person, place, and time, normal mood, behavior, speech, dress, motor activity, and thought processes Ears - bilateral TM's and external ear canals normal Nose - normal and patent, no erythema, discharge or polyps Mouth - mucous membranes moist, pharynx normal without lesions Neck - supple, no significant adenopathy Chest - clear to auscultation, no wheezes, rales  or rhonchi, symmetric air entry, no tachypnea, retractions or cyanosis Heart - normal rate, regular rhythm, normal S1, S2, no murmurs, rubs, clicks or gallops Extremities - peripheral pulses normal, no pedal edema, no clubbing or cyanosis, no pedal edema noted  ED Course  Procedures (including critical care time)  Labs Reviewed  CBC - Abnormal; Notable for the following:    Hemoglobin 12.6 (*)    HCT 38.8 (*)    All other components within normal limits  BASIC METABOLIC PANEL - Abnormal;  Notable for the following:    Sodium 134 (*)    Chloride 95 (*)    Glucose, Bld 183 (*)    BUN 37 (*)    Creatinine, Ser 1.54 (*)    GFR calc non Af Amer 45 (*)    GFR calc Af Amer 52 (*)    All other components within normal limits  PRO B NATRIURETIC PEPTIDE - Abnormal; Notable for the following:    Pro B Natriuretic peptide (BNP) 798.6 (*)    All other components within normal limits  TROPONIN I  DIFFERENTIAL  URINALYSIS, ROUTINE W REFLEX MICROSCOPIC   Dg Chest 2 View  12/14/2011  *RADIOLOGY REPORT*  Clinical Data: Shortness of breath, weakness.  CHEST - 2 VIEW  Comparison: 12/06/2011  Findings: Prior CABG.  Mild cardiomegaly.  Lungs are clear.  No effusions or edema.  No acute bony abnormality.  IMPRESSION: No active disease.  Original Report Authenticated By: Cyndie Chime, M.D.   The patient has been stable here in the emergency department I have nott noted any respiratory distress while here in the department.  I spoke with Dr. Arthur Holms, who is on call for [name].  Patient will be given an albuterol inhaler for home.  Patient does not results and the plan.  The patient has been evaluated for the shortness of breath on multiple occasions here in the emergency department.     MDM   Date: 12/14/2011  Rate: 61  Rhythm: normal sinus rhythm  QRS Axis: normal  Intervals: normal  ST/T Wave abnormalities: normal  Conduction Disutrbances:first-degree A-V block  and right bundle branch  block  Narrative Interpretation:   Old EKG Reviewed: unchanged  MDM Reviewed: nursing note, vitals and previous chart Reviewed previous: labs, x-ray and ECG Interpretation: labs, x-ray and ECG Consults: primary care provider            Carlyle Dolly, PA-C 12/14/11 2115

## 2011-12-14 NOTE — ED Notes (Signed)
Pt instructions including demonstration of use of inhaler went over with pt and spouse. No questions voiced at this time. Pt discharged home in improved condition.

## 2011-12-14 NOTE — ED Provider Notes (Signed)
Medical screening examination/treatment/procedure(s) were conducted as a shared visit with non-physician practitioner(s) and myself.  I personally evaluated the patient during the encounter   Benny Lennert, MD 12/14/11 2328

## 2011-12-14 NOTE — ED Notes (Signed)
Pt.'s wife reports that pt. Developed sob since last night, with a cough.  Denies any chest pain, Pt. Is blind.  No s/s of resp. distress

## 2011-12-14 NOTE — ED Notes (Signed)
Pt O2 sat 99% on RA. Pt wife at bed side and states pt has "been having these episodes but all the test come back negative". Lung sounds are clear, pt denies cough, states this episode of SOB started this AM. Denies CP

## 2011-12-14 NOTE — ED Notes (Signed)
Re-paged Dr. Arthur Holms to [phone]

## 2011-12-14 NOTE — Discharge Instructions (Signed)
Call [name] tomorrow morning establish a followup visit as soon as possible.  I spoke with Dr. Arthur Holms, who was on call for [name] and he at present he would pass the message along to him.  Return here as needed for any worsening in your condition.

## 2011-12-19 ENCOUNTER — Other Ambulatory Visit: Payer: Medicare Other

## 2011-12-19 ENCOUNTER — Telehealth: Payer: Self-pay | Admitting: Cardiovascular Disease

## 2011-12-19 ENCOUNTER — Telehealth: Payer: Self-pay | Admitting: Endocrinology

## 2011-12-19 NOTE — Telephone Encounter (Signed)
Er now

## 2011-12-19 NOTE — Telephone Encounter (Signed)
[facility] [employer] nurse called stating pt need to be seen today for increasing sob and do not think pt will last until tomorrow appt.  I contacted pt after talking with dr Adair Patter and dr Everardo All said pt need to go to emergency room.  Pt declined emergency room saying he took lasix and pain med and he is feeling better.

## 2011-12-19 NOTE — Telephone Encounter (Signed)
Message copied by [name] on Tue Dec 19, 2011  3:41 PM ------      Message from: COUSIN, [place] T      Created: Tue Dec 19, 2011  2:55 PM      Regarding: [facility] NURSE SAYS PT NEED TO BE SEEN TODAY       Dr Everardo All,  Eliezer Mccoy has an appt tomorrow with you.  The [facility] nurse says he need to be seen today for increase sob and she don't think he will last til tomorrow.  You or no other md do not have any available slots.  Please let me know if you want this pt worked in or ER or [facility].  Thank you for your reply.

## 2011-12-19 NOTE — Telephone Encounter (Signed)
Pt's nurse just talked with pt and having extreme SOB , swelling legs and feet but not more than a few weeks ago, sending to triage

## 2011-12-19 NOTE — Telephone Encounter (Signed)
I spoke with Byrd Hesselbach who is a Engineer, civil (consulting) with [employer].  She contacted the pt because of the heart failure program that he is involved in with his insurance company.  The pt has not had any weight gain and per Byrd Hesselbach the pt denies any worsening swelling in feet and legs. She said the pt is complaining of SOB and slept with an extra pillow last night.  She said the pt commented that if his back is on a firmer surface he can breath better.  I made Byrd Hesselbach aware that the pt was seen in the ER on 12/14/11 for SOB and was given an albuterol inhaler.   Byrd Hesselbach is going to call the pt back and see if he is using albuterol inhaler as prescribed.  It looks like the pt was going to be referred to pulmonary from his 12/11/11 OV but the pt wanted to follow-up with his PCP.  The pt is scheduled to see his PCP tomorrow for follow-up.

## 2011-12-20 ENCOUNTER — Other Ambulatory Visit (INDEPENDENT_AMBULATORY_CARE_PROVIDER_SITE_OTHER): Payer: Medicare Other

## 2011-12-20 ENCOUNTER — Encounter: Payer: Self-pay | Admitting: Endocrinology

## 2011-12-20 ENCOUNTER — Ambulatory Visit (INDEPENDENT_AMBULATORY_CARE_PROVIDER_SITE_OTHER): Payer: Medicare Other | Admitting: Endocrinology

## 2011-12-20 VITALS — BP 124/68 | HR 66 | Temp 97.7°F | Ht 68.0 in | Wt 222.0 lb

## 2011-12-20 DIAGNOSIS — R0602 Shortness of breath: Secondary | ICD-10-CM

## 2011-12-20 DIAGNOSIS — I5023 Acute on chronic systolic (congestive) heart failure: Secondary | ICD-10-CM

## 2011-12-20 LAB — BASIC METABOLIC PANEL
BUN: 32 mg/dL — ABNORMAL HIGH (ref 6–23)
Chloride: 97 mEq/L (ref 96–112)
GFR: 50.99 mL/min — ABNORMAL LOW (ref >60.00)
Potassium: 4.1 mEq/L (ref 3.5–5.1)

## 2011-12-20 LAB — BRAIN NATRIURETIC PEPTIDE: Pro B Natriuretic peptide (BNP): 175 pg/mL — ABNORMAL HIGH (ref 0.0–100.0)

## 2011-12-20 MED ORDER — GABAPENTIN 300 MG PO CAPS: 300.0000 mg | ORAL_CAPSULE | Freq: Two times a day (BID) | ORAL | Status: DC

## 2011-12-20 NOTE — Patient Instructions (Addendum)
blood tests are being requested for you today.  You will receive a letter with results.  Based on the results, we may be able to increase the fluid medication.  here is a sample of "advair-100."  take 1 puff 2x a day, on a trial basis.  rinse mouth after using.  Please come back for a follow-up appointment in 1 week.

## 2011-12-20 NOTE — Progress Notes (Signed)
Subjective:    Patient ID: Mark French, male    DOB: Oct 19, 1945, 66 y.o.   MRN: [id number]  HPI Pt states 1 month of moderate dyspnea sensation in the chest, but no assoc cough.  He was given albuterol inhaler at ER.  He says it helps.   Past Medical History  Diagnosis Date  . CORONARY ARTERY DISEASE 03/11/2007    s/p CABG 1998;  admitted 08/2011 with an inferior STEMI.  LHC 09/07/11: Severe  Three-vessel CAD, LIMA-LAD patent, SVG-RCA chronically occluded, SVG-circumflex chronically occluded, SVG-diagonal 95%.  PCI: Promus DES to the SVG-diagonal.  Procedure c/b transient CHB req CPR and TTVP;  myoview 4/13:large severe inferolateral infarct, no ischemia, EF 25%    . DIABETES MELLITUS, TYPE I 03/11/2007  . HYPERTENSION 03/11/2007  . BLINDNESS 05/21/2008  . Proliferative diabetic retinopathy 03/09/2009  . HYPERCHOLESTEROLEMIA 09/28/2008  . CERVICAL RADICULOPATHY, LEFT 09/23/2008  . ANEMIA-IRON DEFICIENCY 09/23/2008  . DEPRESSION 09/23/2008  . PERIPHERAL NEUROPATHY 09/23/2008  . Chronic systolic heart failure 09/23/2008    Echocardiogram 09/07/11: Difficult study, inferior and posterior lateral wall hypokinesis, mild LVH, EF 45%, mild LAE. ;  myoview 12/04/11:  large severe inferolateral infarct, no ischemia, EF 25%  . RENAL INSUFFICIENCY 03/15/2010  . CEREBROVASCULAR ACCIDENT, HX OF 09/23/2008  . PERIPHERAL VASCULAR INSUFFICIENCY,  LEGS, BILATERAL 08/17/2010    ABI's performed 04/19/11 R 0.72 L 0.71  . ED (erectile dysfunction)   . Charcot foot due to diabetes mellitus     chronic pain  . Hypogonadism male   . GI bleed 2012    Multiple Duodenal ulcer   . Osteoarthritis   . Obesity     Past Surgical History  Procedure Date  . Coronary artery bypass graft   . Carpal tunnel release     left  . Wrist surgery     s/p right-bone graft fusion  . Spine surgery 2010    C spine surgery for disc Dr. Yetta Barre  . Sigmoidoscopy 03/11/2001  . Venous doppler 01/30/2004  . Cataract surgery   . Ankle surgery       left  . Foot surgery     left  . Cardiac catheterization   . Eye surgery   . Fracture surgery     History   Social History  . Marital Status: Married    Spouse Name: N/A    Number of Children: 0  . Years of Education: N/A   Occupational History  . Disabled    Social History Main Topics  . Smoking status: Never Smoker   . Smokeless tobacco: Never Used  . Alcohol Use: No  . Drug Use: No  . Sexually Active: No   Other Topics Concern  . Not on file   Social History Narrative   Comes to appointments with wife0 Caffeine drinks daily     Current Outpatient Prescriptions on File Prior to Visit  Medication Sig Dispense Refill  . albuterol (PROVENTIL HFA;VENTOLIN HFA) 108 (90 BASE) MCG/ACT inhaler Inhale 2 puffs into the lungs every 6 (six) hours as needed for wheezing.  1 Inhaler  0  . aspirin 81 MG chewable tablet Chew 81 mg by mouth daily.      . carvedilol (COREG) 6.25 MG tablet Take 6.25 mg by mouth 2 (two) times daily with a meal.      . clopidogrel (PLAVIX) 75 MG tablet Take 75 mg by mouth daily with breakfast.      . clotrimazole-betamethasone (LOTRISONE) cream Apply 1 application topically 2 (  two) times daily as needed. For rash      . Docusate Sodium (STOOL SOFTENER) 100 MG capsule Take 100 mg by mouth daily as needed. constipation      . furosemide (LASIX) 80 MG tablet Take 80 mg by mouth 2 (two) times daily. May take an extra 80 mg as needed      . HYDROcodone-acetaminophen (NORCO) 10-325 MG per tablet Take 1-2 tablets by mouth every 4 (four) hours as needed. For pain      . insulin aspart (NOVOLOG) 100 UNIT/ML injection Inject 15 Units into the skin 3 (three) times daily with meals.      . insulin NPH (HUMULIN N,NOVOLIN N) 100 UNIT/ML injection Inject 30 Units into the skin daily before breakfast.       . losartan (COZAAR) 50 MG tablet Take 50 mg by mouth daily.      . nitroGLYCERIN (NITROSTAT) 0.4 MG SL tablet Place 1 tablet (0.4 mg total) under the tongue every  5 (five) minutes as needed. For chest pain  25 tablet  3  . omeprazole (PRILOSEC) 20 MG capsule Take 20 mg by mouth daily.      . potassium chloride SA (K-DUR,KLOR-CON) 20 MEQ tablet Take 20 mEq by mouth daily.      . rosuvastatin (CRESTOR) 20 MG tablet Take 20 mg by mouth daily.      [name] spironolactone (ALDACTONE) 25 MG tablet Take 12.5 mg by mouth daily.      . Tamsulosin HCl (FLOMAX) 0.4 MG CAPS Take 0.4 mg by mouth daily.      [name] DISCONTD: gabapentin (NEURONTIN) 300 MG capsule Take 1 capsule (300 mg total) by mouth at bedtime.  30 capsule  11    Allergies  Allergen Reactions  . Contrast Media (Iodinated Diagnostic Agents) Nausea Only  . Sildenafil Nausea Only and Other (See Comments)    Family History  Problem Relation Age of Onset  . Heart disease Mother     Coronary Artery Disease  . Heart disease Father     Coronary Artery Disease  . Heart disease Paternal Grandmother     Coronary Artery Disease  . Colon cancer Neg Hx   . Diabetes Other     Grandmother    BP 124/68  Pulse 66  Temp(Src) 97.7 F (36.5 C) (Oral)  Ht 5\' 8"  (1.727 m)  Wt 222 lb (100.699 kg)  BMI 33.76 kg/m2  SpO2 97%    Review of Systems Denies chest pain and fever.      Objective:   Physical Exam VITAL SIGNS:  See vs page GENERAL: no distress LUNGS:  Clear to auscultation Ext: 1+ bilat leg edema   (i reviewed pft) BNP: improved Lab Results  Component Value Date   WBC 7.6 12/14/2011   HGB 12.6* 12/14/2011   HCT 38.8* 12/14/2011   PLT 167 12/14/2011   GLUCOSE 113* 12/20/2011   CHOL 235* 07/19/2010   TRIG 204.0* 07/19/2010   HDL 40.40 07/19/2010   LDLDIRECT 158.5 07/19/2010   LDLCALC  Value: 84        Total Cholesterol/HDL:CHD Risk Coronary Heart Disease Risk Table                     Men   Women  1/2 Average Risk   3.4   3.3  Average Risk       5.0   4.4  2 X Average Risk   9.6   7.1  3 X Average Risk  23.4   11.0        Use the calculated Patient Ratio above and the CHD Risk Table to determine the  patient's CHD Risk.        ATP III CLASSIFICATION (LDL):  <100     mg/dL   Optimal  295-284  mg/dL   Near or Above                    Optimal  130-159  mg/dL   Borderline  132-440  mg/dL   High  >102     mg/dL   Very High 02/05/5365   ALT 13 11/19/2011   AST 16 11/19/2011   NA 134* 12/20/2011   K 4.1 12/20/2011   CL 97 12/20/2011   CREATININE 1.5 12/20/2011   BUN 32* 12/20/2011   CO2 28 12/20/2011   TSH 0.416 08/23/2011   PSA 0.25 07/19/2010   INR 0.95 09/07/2011   HGBA1C 6.7* 11/19/2011   MICROALBUR 0.6 07/19/2010      Assessment & Plan:  Chf, improved.  Renal insuff is a relative contraindication to increasing the lasix Obesity, unchanged restrictive lung dz.  This could be due to either of the above.  He gets some symptomatic relief from mdi, so it is ok with me to continue

## 2011-12-23 ENCOUNTER — Telehealth: Payer: Self-pay | Admitting: Physician Assistant

## 2011-12-23 NOTE — Telephone Encounter (Signed)
Patient calling in today with Increased dyspnea over the course of the day. Up 6 pounds today when compared to weight this am.  Of note, evening weight done after eating. Not much edema noted. Having right shoulder pain like he did with his MI. C/o SOB at rest.  Worse with lying flat. Takes Lasix 80 bid and 40 extra QOD.  Took extra Lasix yesterday on schedule. No chest pain.  No syncope.  Notes some dizziness. Used Albuterol which did not help.  Difficult situation. Sounds like patient mainly anxious about his dx of ICM and CHF. He is expected to be chronically short of breath with his co-morbidities. I believe that he gets more anxious with any change in his dyspnea which exacerbates his condition. Also some component of pulmonary disease. At this point, I cannot rule out worsening CHF. Also notes right shoulder pain - ? Anginal equivalent. Patient has had extensive OP workup recently. Also, has gone to ED several times recently as well.  Given his perception of worsening dyspnea over the last several hours, I have advised him to take an extra lasix 40 mg now and go to the ED. His wife is somewhat reluctant. However, I explained that the safest thing to do is to be evaluated tonight. She agrees to take him and will call 911 if breathing gets worse. Tereso Newcomer, PA-C  9:12 PM 12/23/2011

## 2011-12-25 ENCOUNTER — Other Ambulatory Visit: Payer: Self-pay | Admitting: Endocrinology

## 2011-12-25 ENCOUNTER — Other Ambulatory Visit: Payer: Self-pay | Admitting: Internal Medicine

## 2011-12-25 MED ORDER — FLUTICASONE-SALMETEROL 100-50 MCG/DOSE IN AEPB: 1.0000 | INHALATION_SPRAY | Freq: Two times a day (BID) | RESPIRATORY_TRACT | Status: DC

## 2011-12-25 NOTE — Telephone Encounter (Signed)
please call patient: i sent inhaler refill

## 2011-12-25 NOTE — Telephone Encounter (Signed)
Pt's wife informed rx for inhaler sent to pharmacy.

## 2011-12-25 NOTE — Telephone Encounter (Signed)
Wife/Marilyn requesting inhaler prescription/advair---was given a sample---working well--1 puff left---[facility]--wife ph # [phone]/work/

## 2011-12-28 ENCOUNTER — Encounter: Payer: Self-pay | Admitting: Endocrinology

## 2011-12-28 ENCOUNTER — Ambulatory Visit (INDEPENDENT_AMBULATORY_CARE_PROVIDER_SITE_OTHER): Payer: Medicare Other | Admitting: Endocrinology

## 2011-12-28 VITALS — BP 128/68 | HR 61 | Temp 97.7°F | Ht 68.0 in | Wt 225.0 lb

## 2011-12-28 DIAGNOSIS — R0602 Shortness of breath: Secondary | ICD-10-CM

## 2011-12-28 NOTE — Patient Instructions (Addendum)
Refer to a lung specialist.  you will receive a phone call, about a day and time for an appointment. Take nph, 50 units each morning Minimize or avoid the novolog altogether. check your blood sugar 3 times a day.  vary the time of day when you check, between before the 3 meals, and at bedtime.  also check if you have symptoms of your blood sugar being too high or too low.  please keep a record of the readings and bring it to your next appointment here.  please call us sooner if your blood sugar goes below 70, or if it stays over 200.

## 2011-12-28 NOTE — Progress Notes (Signed)
Subjective:    Patient ID: Mark French, male    DOB: 1946-04-28, 66 y.o.   MRN: [id number]  HPI The state of at least three ongoing medical problems is addressed today: Pt returns for f/u of sob.  He says he feels only slightly better on the advair.   DM: Yesterday, pt had mild hypoglycemia at 10 am.  He has been taking nph in am, and prn humalog (averagesmore than 20 units/day total).  He was found on spirometry to have restrictive Dz.  Despite this, he was given a trial of advair, for symptomatic relief. Past Medical History  Diagnosis Date  . CORONARY ARTERY DISEASE 03/11/2007    s/p CABG 1998;  admitted 08/2011 with an inferior STEMI.  LHC 09/07/11: Severe  Three-vessel CAD, LIMA-LAD patent, SVG-RCA chronically occluded, SVG-circumflex chronically occluded, SVG-diagonal 95%.  PCI: Promus DES to the SVG-diagonal.  Procedure c/b transient CHB req CPR and TTVP;  myoview 4/13:large severe inferolateral infarct, no ischemia, EF 25%    . DIABETES MELLITUS, TYPE I 03/11/2007  . HYPERTENSION 03/11/2007  . BLINDNESS 05/21/2008  . Proliferative diabetic retinopathy 03/09/2009  . HYPERCHOLESTEROLEMIA 09/28/2008  . CERVICAL RADICULOPATHY, LEFT 09/23/2008  . ANEMIA-IRON DEFICIENCY 09/23/2008  . DEPRESSION 09/23/2008  . PERIPHERAL NEUROPATHY 09/23/2008  . Chronic systolic heart failure 09/23/2008    Echocardiogram 09/07/11: Difficult study, inferior and posterior lateral wall hypokinesis, mild LVH, EF 45%, mild LAE. ;  myoview 12/04/11:  large severe inferolateral infarct, no ischemia, EF 25%  . RENAL INSUFFICIENCY 03/15/2010  . CEREBROVASCULAR ACCIDENT, HX OF 09/23/2008  . PERIPHERAL VASCULAR INSUFFICIENCY,  LEGS, BILATERAL 08/17/2010    ABI's performed 04/19/11 R 0.72 L 0.71  . ED (erectile dysfunction)   . Charcot foot due to diabetes mellitus     chronic pain  . Hypogonadism male   . GI bleed 2012    Multiple Duodenal ulcer   . Osteoarthritis   . Obesity     Past Surgical History  Procedure Date  .  Coronary artery bypass graft   . Carpal tunnel release     left  . Wrist surgery     s/p right-bone graft fusion  . Spine surgery 2010    C spine surgery for disc Dr. Yetta Barre  . Sigmoidoscopy 03/11/2001  . Venous doppler 01/30/2004  . Cataract surgery   . Ankle surgery     left  . Foot surgery     left  . Cardiac catheterization   . Eye surgery   . Fracture surgery     History   Social History  . Marital Status: Married    Spouse Name: N/A    Number of Children: 0  . Years of Education: N/A   Occupational History  . Disabled    Social History Main Topics  . Smoking status: Never Smoker   . Smokeless tobacco: Never Used  . Alcohol Use: No  . Drug Use: No  . Sexually Active: No   Other Topics Concern  . Not on file   Social History Narrative   Comes to appointments with wife0 Caffeine drinks daily     Current Outpatient Prescriptions on File Prior to Visit  Medication Sig Dispense Refill  . albuterol (PROVENTIL HFA;VENTOLIN HFA) 108 (90 BASE) MCG/ACT inhaler Inhale 2 puffs into the lungs every 6 (six) hours as needed for wheezing.  1 Inhaler  0  . aspirin 81 MG chewable tablet Chew 81 mg by mouth daily.      . carvedilol (COREG)  6.25 MG tablet Take 6.25 mg by mouth 2 (two) times daily with a meal.      . clopidogrel (PLAVIX) 75 MG tablet Take 75 mg by mouth daily with breakfast.      . clotrimazole-betamethasone (LOTRISONE) cream Apply 1 application topically 2 (two) times daily as needed. For rash      . Docusate Sodium (STOOL SOFTENER) 100 MG capsule Take 100 mg by mouth daily as needed. constipation      . Fluticasone-Salmeterol (ADVAIR DISKUS) 100-50 MCG/DOSE AEPB Inhale 1 puff into the lungs 2 (two) times daily.  1 each  11  . furosemide (LASIX) 80 MG tablet Take 80 mg by mouth 2 (two) times daily. May take an extra 80 mg as needed      . gabapentin (NEURONTIN) 300 MG capsule Take 1 capsule (300 mg total) by mouth 2 (two) times daily.  60 capsule  5  .  HYDROcodone-acetaminophen (NORCO) 10-325 MG per tablet Take 1-2 tablets by mouth every 4 (four) hours as needed. For pain      . insulin NPH (HUMULIN N,NOVOLIN N) 100 UNIT/ML injection Inject 50 Units into the skin at bedtime.       [name] losartan (COZAAR) 50 MG tablet Take 50 mg by mouth daily.      . nitroGLYCERIN (NITROSTAT) 0.4 MG SL tablet Place 1 tablet (0.4 mg total) under the tongue every 5 (five) minutes as needed. For chest pain  25 tablet  3  . omeprazole (PRILOSEC) 20 MG capsule TAKE 1 CAPSULE BY MOUTH DAILY  30 capsule  3  . potassium chloride SA (K-DUR,KLOR-CON) 20 MEQ tablet Take 20 mEq by mouth daily.      [name] spironolactone (ALDACTONE) 25 MG tablet Take 12.5 mg by mouth daily.      . Tamsulosin HCl (FLOMAX) 0.4 MG CAPS Take 0.4 mg by mouth daily.      . rosuvastatin (CRESTOR) 20 MG tablet Take 20 mg by mouth daily.      [name] DISCONTD: gabapentin (NEURONTIN) 300 MG capsule Take 1 capsule (300 mg total) by mouth at bedtime.  30 capsule  11    Allergies  Allergen Reactions  . Contrast Media (Iodinated Diagnostic Agents) Nausea Only  . Sildenafil Nausea Only and Other (See Comments)    Family History  Problem Relation Age of Onset  . Heart disease Mother     Coronary Artery Disease  . Heart disease Father     Coronary Artery Disease  . Heart disease Paternal Grandmother     Coronary Artery Disease  . Colon cancer Neg Hx   . 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 DM, The pattern of his cbg's indicates he needs some adjustment in his therapy.

## 2011-12-31 ENCOUNTER — Encounter (HOSPITAL_COMMUNITY): Payer: Self-pay | Admitting: *Deleted

## 2011-12-31 ENCOUNTER — Emergency Department (HOSPITAL_COMMUNITY)
Admission: EM | Admit: 2011-12-31 | Discharge: 2012-01-01 | Disposition: A | Discharge status: Home / Self Care | Payer: Medicare Other | Attending: Emergency Medicine | Admitting: Emergency Medicine

## 2011-12-31 DIAGNOSIS — R0989 Other specified symptoms and signs involving the circulatory and respiratory systems: Secondary | ICD-10-CM | POA: Insufficient documentation

## 2011-12-31 DIAGNOSIS — R0600 Dyspnea, unspecified: Secondary | ICD-10-CM

## 2011-12-31 DIAGNOSIS — I10 Essential (primary) hypertension: Secondary | ICD-10-CM | POA: Insufficient documentation

## 2011-12-31 DIAGNOSIS — E669 Obesity, unspecified: Secondary | ICD-10-CM | POA: Insufficient documentation

## 2011-12-31 DIAGNOSIS — Z79899 Other long term (current) drug therapy: Secondary | ICD-10-CM | POA: Insufficient documentation

## 2011-12-31 DIAGNOSIS — Z8673 Personal history of transient ischemic attack (TIA), and cerebral infarction without residual deficits: Secondary | ICD-10-CM | POA: Insufficient documentation

## 2011-12-31 DIAGNOSIS — R0609 Other forms of dyspnea: Secondary | ICD-10-CM | POA: Insufficient documentation

## 2011-12-31 DIAGNOSIS — E109 Type 1 diabetes mellitus without complications: Secondary | ICD-10-CM | POA: Insufficient documentation

## 2011-12-31 DIAGNOSIS — I2510 Atherosclerotic heart disease of native coronary artery without angina pectoris: Secondary | ICD-10-CM | POA: Insufficient documentation

## 2011-12-31 DIAGNOSIS — I509 Heart failure, unspecified: Secondary | ICD-10-CM | POA: Insufficient documentation

## 2011-12-31 LAB — DIFFERENTIAL
Basophils Absolute: 0 10*3/uL (ref 0.0–0.1)
Basophils Relative: 0 % (ref 0–1)
Eosinophils Relative: 5 % (ref 0–5)
Monocytes Absolute: 0.9 10*3/uL (ref 0.1–1.0)
Neutro Abs: 5.5 10*3/uL (ref 1.7–7.7)

## 2011-12-31 LAB — CBC
HCT: 37.2 % — ABNORMAL LOW (ref 39.0–52.0)
MCHC: 32 g/dL (ref 30.0–36.0)
RDW: 14 % (ref 11.5–15.5)

## 2011-12-31 NOTE — ED Notes (Signed)
Difficulty breathing since 0400am today and he has pain in his lt shoulder

## 2012-01-01 ENCOUNTER — Emergency Department (HOSPITAL_COMMUNITY): Payer: Medicare Other

## 2012-01-01 LAB — COMPREHENSIVE METABOLIC PANEL
AST: 20 U/L (ref 0–37)
Albumin: 3.4 g/dL — ABNORMAL LOW (ref 3.5–5.2)
Calcium: 9.2 mg/dL (ref 8.4–10.5)
Chloride: 97 mEq/L (ref 96–112)
Creatinine, Ser: 1.42 mg/dL — ABNORMAL HIGH (ref 0.50–1.35)

## 2012-01-01 LAB — PRO B NATRIURETIC PEPTIDE: Pro B Natriuretic peptide (BNP): 676.3 pg/mL — ABNORMAL HIGH (ref 0–125)

## 2012-01-01 LAB — CK TOTAL AND CKMB (NOT AT ARMC)
CK, MB: 6 ng/mL — ABNORMAL HIGH (ref 0.3–4.0)
Relative Index: 1.6 (ref 0.0–2.5)

## 2012-01-01 LAB — TROPONIN I: Troponin I: <0.3 ng/mL (ref <0.30)

## 2012-01-01 MED ORDER — ALBUTEROL SULFATE (5 MG/ML) 0.5% IN NEBU
2.5000 mg | INHALATION_SOLUTION | Freq: Once | RESPIRATORY_TRACT | Status: AC
  Administered 2012-01-01: 2.5 mg via RESPIRATORY_TRACT
  Filled 2012-01-01: qty 0.5

## 2012-01-01 MED ORDER — FUROSEMIDE 10 MG/ML IJ SOLN
40.0000 mg | Freq: Once | INTRAMUSCULAR | Status: AC
  Administered 2012-01-01: 40 mg via INTRAMUSCULAR
  Filled 2012-01-01: qty 4

## 2012-01-01 MED ORDER — OXYCODONE-ACETAMINOPHEN 5-325 MG PO TABS
2.0000 | ORAL_TABLET | Freq: Once | ORAL | Status: AC
  Administered 2012-01-01: 2 via ORAL
  Filled 2012-01-01: qty 2

## 2012-01-01 NOTE — ED Provider Notes (Signed)
History     CSN: [id number]  Arrival date & time 12/31/11  2319   First MD Initiated Contact with Patient 01/01/12 0132      Chief Complaint  Patient presents with  . Shortness of Breath    (Consider location/radiation/quality/duration/timing/severity/associated sxs/prior treatment) Patient is a 66 y.o. male presenting with shortness of breath. The history is provided by the patient.  Shortness of Breath  Associated symptoms include shortness of breath.  He has been complaining of shortness of breath since 4 AM. This is an ongoing intermittent problem for him. He denies chest pain, heaviness, tightness, pressure. Dyspnea is moderate to severe. Nothing makes it better nothing makes it worse. He denies fever, chills, sweats. There's been no nausea or vomiting. He has not done anything to try and help it. In the past, breathing treatments have been helpful. He does monitor his weight and his weight had gone up 4 pounds as of 2 days ago but was down 1 pound from that today.  Past Medical History  Diagnosis Date  . CORONARY ARTERY DISEASE 03/11/2007    s/p CABG 1998;  admitted 08/2011 with an inferior STEMI.  LHC 09/07/11: Severe  Three-vessel CAD, LIMA-LAD patent, SVG-RCA chronically occluded, SVG-circumflex chronically occluded, SVG-diagonal 95%.  PCI: Promus DES to the SVG-diagonal.  Procedure c/b transient CHB req CPR and TTVP;  myoview 4/13:large severe inferolateral infarct, no ischemia, EF 25%    . DIABETES MELLITUS, TYPE I 03/11/2007  . HYPERTENSION 03/11/2007  . BLINDNESS 05/21/2008  . Proliferative diabetic retinopathy 03/09/2009  . HYPERCHOLESTEROLEMIA 09/28/2008  . CERVICAL RADICULOPATHY, LEFT 09/23/2008  . ANEMIA-IRON DEFICIENCY 09/23/2008  . DEPRESSION 09/23/2008  . PERIPHERAL NEUROPATHY 09/23/2008  . Chronic systolic heart failure 09/23/2008    Echocardiogram 09/07/11: Difficult study, inferior and posterior lateral wall hypokinesis, mild LVH, EF 45%, mild LAE. ;  myoview 12/04/11:  large  severe inferolateral infarct, no ischemia, EF 25%  . RENAL INSUFFICIENCY 03/15/2010  . CEREBROVASCULAR ACCIDENT, HX OF 09/23/2008  . PERIPHERAL VASCULAR INSUFFICIENCY,  LEGS, BILATERAL 08/17/2010    ABI's performed 04/19/11 R 0.72 L 0.71  . ED (erectile dysfunction)   . Charcot foot due to diabetes mellitus     chronic pain  . Hypogonadism male   . GI bleed 2012    Multiple Duodenal ulcer   . Osteoarthritis   . Obesity     Past Surgical History  Procedure Date  . Coronary artery bypass graft   . Carpal tunnel release     left  . Wrist surgery     s/p right-bone graft fusion  . Spine surgery 2010    C spine surgery for disc Dr. Yetta Barre  . Sigmoidoscopy 03/11/2001  . Venous doppler 01/30/2004  . Cataract surgery   . Ankle surgery     left  . Foot surgery     left  . Cardiac catheterization   . Eye surgery   . Fracture surgery     Family History  Problem Relation Age of Onset  . Heart disease Mother     Coronary Artery Disease  . Heart disease Father     Coronary Artery Disease  . Heart disease Paternal Grandmother     Coronary Artery Disease  . Colon cancer Neg Hx   . Diabetes Other     Grandmother    History  Substance Use Topics  . Smoking status: Never Smoker   . Smokeless tobacco: Never Used  . Alcohol Use: No      Review  of Systems  Respiratory: Positive for shortness of breath.   All other systems reviewed and are negative.    Allergies  Contrast media and Sildenafil  Home Medications   Current Outpatient Rx  Name Route Sig Dispense Refill  . ALBUTEROL SULFATE HFA 108 (90 BASE) MCG/ACT IN AERS Inhalation Inhale 2 puffs into the lungs every 6 (six) hours as needed for wheezing. 1 Inhaler 0  . ASPIRIN 81 MG PO CHEW Oral Chew 81 mg by mouth daily.    [name] CARVEDILOL 6.25 MG PO TABS Oral Take 6.25 mg by mouth 2 (two) times daily with a meal.    . CLOPIDOGREL BISULFATE 75 MG PO TABS Oral Take 75 mg by mouth daily with breakfast.    .  CLOTRIMAZOLE-BETAMETHASONE 1-0.05 % EX CREA Topical Apply 1 application topically 2 (two) times daily as needed. For rash    . DOCUSATE SODIUM 100 MG PO TABS Oral Take 100 mg by mouth daily as needed. constipation    . FLUTICASONE-SALMETEROL 100-50 MCG/DOSE IN AEPB Inhalation Inhale 1 puff into the lungs 2 (two) times daily. 1 each 11  . FUROSEMIDE 80 MG PO TABS Oral Take 80 mg by mouth 2 (two) times daily. May take an extra 80 mg as needed    . GABAPENTIN 300 MG PO CAPS Oral Take 1 capsule (300 mg total) by mouth 2 (two) times daily. 60 capsule 5  . HYDROCODONE-ACETAMINOPHEN 10-325 MG PO TABS Oral Take 1-2 tablets by mouth every 4 (four) hours as needed. For pain    . INSULIN ISOPHANE HUMAN 100 UNIT/ML [facility] SUSP Subcutaneous Inject 50 Units into the skin daily before breakfast.     . LOSARTAN POTASSIUM 50 MG PO TABS Oral Take 50 mg by mouth daily.    [name] NITROGLYCERIN 0.4 MG SL SUBL Sublingual Place 1 tablet (0.4 mg total) under the tongue every 5 (five) minutes as needed. For chest pain 25 tablet 3  . NOVOLOG FLEXPEN 100 UNIT/ML [place] SOLN Subcutaneous Inject into the skin 3 (three) times daily as needed. Sliding scale    . OMEPRAZOLE 20 MG PO CPDR  TAKE 1 CAPSULE BY MOUTH DAILY 30 capsule 3  . POTASSIUM CHLORIDE CRYS ER 20 MEQ PO TBCR Oral Take 20 mEq by mouth daily.    [name] ROSUVASTATIN CALCIUM 20 MG PO TABS Oral Take 20 mg by mouth daily.    [name] SPIRONOLACTONE 25 MG PO TABS Oral Take 12.5 mg by mouth daily.    [name] TAMSULOSIN HCL 0.4 MG PO CAPS Oral Take 0.4 mg by mouth daily.      BP 119/43  Pulse 64  Temp(Src) 97.9 F (36.6 C) (Oral)  Resp 20  SpO2 97%  Physical Exam  Nursing note and vitals reviewed.  66 year old male who is resting comfortably and in no acute distress. He has intermittently been seen in pain which she relates to the reticulocyte pain in his right foot. Head is normocephalic and atraumatic. PERRLA, EOMI. Is nontender and supple without adenopathy or JVD. Back is nontender. Lungs  are clear without rales, wheezes, or rhonchi. Heart has regular rate rhythm without murmur. Abdomen is soft, flat, nontender without masses or hepatosplenomegaly. Extremities have severe venous stasis changes, no edema. Full range of motion is present. Skin is warm and dry with no rash other venous stasis changes. Neurologic: Mental status is normal, cranial nerves are intact, there are no focal motor or sensory deficits.   ED Course  Procedures (including critical care time)  Results for  orders placed during the hospital encounter of 12/31/11  CBC      Component Value Range   WBC 8.5  4.0 - 10.5 (K/uL)   RBC 4.38  4.22 - 5.81 (MIL/uL)   Hemoglobin 11.9 (*) 13.0 - 17.0 (g/dL)   HCT 16.1 (*) 09.6 - 52.0 (%)   MCV 84.9  78.0 - 100.0 (fL)   MCH 27.2  26.0 - 34.0 (pg)   MCHC 32.0  30.0 - 36.0 (g/dL)   RDW 04.5  40.9 - 81.1 (%)   Platelets 173  150 - 400 (K/uL)  DIFFERENTIAL      Component Value Range   Neutrophils Relative 65  43 - 77 (%)   Neutro Abs 5.5  1.7 - 7.7 (K/uL)   Lymphocytes Relative 19  12 - 46 (%)   Lymphs Abs 1.6  0.7 - 4.0 (K/uL)   Monocytes Relative 10  3 - 12 (%)   Monocytes Absolute 0.9  0.1 - 1.0 (K/uL)   Eosinophils Relative 5  0 - 5 (%)   Eosinophils Absolute 0.5  0.0 - 0.7 (K/uL)   Basophils Relative 0  0 - 1 (%)   Basophils Absolute 0.0  0.0 - 0.1 (K/uL)  COMPREHENSIVE METABOLIC PANEL      Component Value Range   Sodium 134 (*) 135 - 145 (mEq/L)   Potassium 3.8  3.5 - 5.1 (mEq/L)   Chloride 97  96 - 112 (mEq/L)   CO2 26  19 - 32 (mEq/L)   Glucose, Bld 129 (*) 70 - 99 (mg/dL)   BUN 31 (*) 6 - 23 (mg/dL)   Creatinine, Ser 9.14 (*) 0.50 - 1.35 (mg/dL)   Calcium 9.2  8.4 - 78.2 (mg/dL)   Total Protein 6.8  6.0 - 8.3 (g/dL)   Albumin 3.4 (*) 3.5 - 5.2 (g/dL)   AST 20  0 - 37 (U/L)   ALT 17  0 - 53 (U/L)   Alkaline Phosphatase 69  39 - 117 (U/L)   Total Bilirubin 0.2 (*) 0.3 - 1.2 (mg/dL)   GFR calc non Af Amer 49 (*) >90 (mL/min)   GFR calc Af Amer 57 (*)  >90 (mL/min)  CK TOTAL AND CKMB      Component Value Range   Total CK 377 (*) 7 - 232 (U/L)   CK, MB 6.0 (*) 0.3 - 4.0 (ng/mL)   Relative Index 1.6  0.0 - 2.5   TROPONIN I      Component Value Range   Troponin I <0.30  <0.30 (ng/mL)  PRO B NATRIURETIC PEPTIDE      Component Value Range   Pro B Natriuretic peptide (BNP) 676.3 (*) 0 - 125 (pg/mL)   Dg Chest 2 View  01/01/2012  *RADIOLOGY REPORT*  Clinical Data: Shortness of breath, worsening today.  Left shoulder pain.  CHEST - 2 VIEW  Comparison: 12/14/2011  Findings: Stable appearance of postoperative changes in the mediastinum.  Borderline heart size with normal pulmonary vascularity.  No focal airspace consolidation or edema in the lungs.  No blunting of costophrenic angles.  No pneumothorax. Degenerative changes in the thoracic spine.  Mild hyperinflation. No significant change since previous study.  IMPRESSION: No evidence of active pulmonary disease.  Original Report Authenticated By: Marlon Pel, M.D.   Dg Chest 2 View  12/14/2011  *RADIOLOGY REPORT*  Clinical Data: Shortness of breath, weakness.  CHEST - 2 VIEW  Comparison: 12/06/2011  Findings: Prior CABG.  Mild cardiomegaly.  Lungs are clear.  No effusions or edema.  No acute bony abnormality.  IMPRESSION: No active disease.  Original Report Authenticated By: Cyndie Chime, M.D.   Dg Chest 2 View  12/06/2011  *RADIOLOGY REPORT*  Clinical Data: Persistent shortness of breath  CHEST - 2 VIEW  Comparison: 12/01/2011; 11/19/2011; VQ scan - 12/01/2011  Findings: Grossly unchanged enlarged cardiac silhouette and mediastinal contours post median sternotomy and CABG.  Overall improved aeration of the bilateral lower lungs with persistent minimal bibasilar heterogeneous opacities, left greater than right. No pleural effusion or pneumothorax.  Grossly unchanged bones including lower cervical ACDF, incompletely imaged.  IMPRESSION:  1.  Stable findings of cardiomegaly without evidence of  pulmonary edema. 2.  Improved aeration of the bilateral lower lungs with persistent bibasilar opacities favored to represent atelectasis.  Original Report Authenticated By: Waynard Reeds, M.D.     Date: 01/01/2012  Rate: 62  Rhythm: normal sinus rhythm  QRS Axis: left  Intervals: PR prolonged and QT prolonged  ST/T Wave abnormalities: normal  Conduction Disutrbances:first-degree A-V block  and right bundle branch block  Narrative Interpretation: Right bundle branch block with first degree AV block, prolonged QT interval. When compared with ECG of 12/14/2011, no significant changes are seen.  Old EKG Reviewed: unchanged     1. Dyspnea   2. CHF (congestive heart failure)       MDM  Dyspnea which may be congestive heart failure. I reviewed his old charts and he has obtained good relief from similar episodes in the past with albuterol so he will empirically be given an albuterol nebulizer treatment even though and there are no wheezes heard exam. Workup had been ordered prior to my seeing him and he has renal insufficiency which is not significantly changed from baseline. He has a mild elevation of total CK with commensurate elevation in CK-MB with a normal percentage and negative troponin. This is not indicative of myocardial injury. Chest x-ray did not show evidence of CHF. BNP will be checked   He feels better after albuterol with Atrovent. Neurittic pain is better after Percocet. BNP is somewhat elevated over baseline. It is elected to give him a single dose of Lasix parenterally and refer him back to his PCP.     Dione Booze, MD 01/01/12 [phone]

## 2012-01-01 NOTE — Discharge Instructions (Signed)
If your weight goes up by more than 3-4 pounds, it means that you have too much fluid in your system and will need to increase your dose of Lasix. Please discuss with your PCP how to do this.  Heart Failure Heart failure (HF) is a condition in which the heart has trouble pumping blood. This means your heart does not pump blood efficiently for your body to work well. In some cases of HF, fluid may back up into your lungs or you may have swelling (edema) in your lower legs. HF is a long-term (chronic) condition. It is important for you to take good care of yourself and follow your caregiver's treatment plan. CAUSES   Health conditions:   High blood pressure (hypertension) causes the heart muscle to work harder than normal. When pressure in the blood vessels is high, the heart needs to pump (contract) with more force in order to circulate blood throughout the body. High blood pressure eventually causes the heart to become stiff and weak.   Coronary artery disease (CAD) is the buildup of cholesterol and fat (plaques) in the arteries of the heart. The blockage in the arteries deprives the heart muscle of oxygen and blood. This can cause chest pain and may lead to a heart attack. High blood pressure can also contribute to CAD.   Heart attack (myocardial infarction) occurs when 1 or more arteries in the heart become blocked. The loss of oxygen damages the muscle tissue of the heart. When this happens, part of the heart muscle dies. The injured tissue does not contract as well and weakens the heart's ability to pump blood.   Abnormal heart valves can cause HF when the heart valves do not open and close properly. This makes the heart muscle pump harder to keep the blood flowing.   Heart muscle disease (cardiomyopathy or myocarditis) is damage to the heart muscle from a variety of causes. These can include drug or alcohol abuse, infections, or unknown reasons. These can increase the risk of HF.   Lung  disease makes the heart work harder because the lungs do not work properly. This can cause a strain on the heart leading it to fail.   Diabetes increases the risk of HF. High blood sugar contributes to high fat (lipid) levels in the blood. Diabetes can also cause slow damage to tiny blood vessels that carry important nutrients to the heart muscle. When the heart does not get enough oxygen and food, it can cause the heart to become weak and stiff. This leads to a heart that does not contract efficiently.   Other diseases can contribute to HF. These include abnormal heart rhythms, thyroid problems, and low blood counts (anemia).   Unhealthy lifestyle habits:   Obesity.   Smoking.   Eating foods high in fat and cholesterol.   Eating or drinking beverages high in salt.   Drug or alcohol abuse.   Lack of exercise.  SYMPTOMS  HF symptoms may vary and can be hard to detect. Symptoms may include:  Shortness of breath with activity, such as climbing stairs.   Persistent cough.   Swelling of the feet, ankles, legs, or abdomen.   Unexplained weight gain.   Difficulty breathing when lying flat.   Waking from sleep because of the need to sit up and get more air.   Rapid heartbeat.   Fatigue and loss of energy.   Feeling lightheaded or close to fainting.  DIAGNOSIS  A diagnosis of HF is based on  your history, symptoms, physical examination, and diagnostic tests. Diagnostic tests for HF may include:  EKG.   Chest X-ray.   Blood tests.   Exercise stress test.   Blood oxygen test (arterial blood gas).   Evaluation by a heart doctor (cardiologist).   Ultrasound evaluation of the heart (echocardiogram).   Heart artery test to look for blockages (angiogram).   Radioactive imaging to look at the heart (radionuclide test).  TREATMENT  Treatment is aimed at managing the symptoms of HF. Medicines, lifestyle changes, or surgical intervention may be necessary to treat  HF.  Medicines to help treat HF may include:   Angiotensin-converting enzyme (ACE) inhibitors. These block the effects of a blood protein called angiotensin-converting enzyme. ACE inhibitors relax (dilate) the blood vessels and help lower blood pressure. This decreases the workload of the heart, slows the progression of HF, and improves symptoms.   Angiotensin receptor blockers (ARBs). These medications work similar to ACE inhibitors. ARBs may be an alternative for people who cannot tolerate an ACE inhibitor.   Aldosterone antagonists. This medication helps get rid of extra fluid from your body. This lowers the volume of blood the heart has to pump.   Water pills (diuretics). Diuretics cause the kidneys to remove salt and water from the blood. The extra fluid is removed by urination. By removing extra fluid from the body, diuretics help lower the workload of the heart and help prevent fluid buildup in the lungs so breathing is easier.   Beta blockers. These prevent the heart from beating too fast and improve heart muscle strength. Beta blockers help maintain a normal heart rate, control blood pressure, and improve HF symptoms.   Digitalis. This increases the force of the heartbeat and may be helpful to people with HF or heart rhythm problems.   Healthy lifestyle changes include:   Stopping smoking.   Eating a healthy diet. Avoid foods high in fat. Avoid foods fried in oil or made with fat. A dietician can help with healthy food choices.   Limiting how much salt you eat.   Limiting alcohol intake to no more than 1 drink per day for women and 2 drinks per day for men. Drinking more than that is harmful to your heart. If your heart has already been damaged by alcohol or you have severe HF, drinking alcohol should be stopped completely.   Exercising as directed by your caregiver.   Surgical treatment for HF may include:   Procedures to open blocked arteries, repair damaged heart valves, or  remove damaged heart muscle tissue.   A pacemaker to help heart muscle function and to control certain abnormal heart rhythms.   A defibrillator to possibly prevent sudden cardiac death.  HOME CARE INSTRUCTIONS   Activity level. Your caregiver can help you determine what type of exercise program may be helpful. It is important to maintain your strength. Pace your physical activity to avoid shortness of breath or chest pain. Rest for 1 hour before and after meals. A cardiac rehabilitation program may be helpful to some people with HF.   Diet. Eat a heart healthy diet. Food choices should be low in saturated fat and cholesterol. Talk to a dietician to learn about heart healthy foods.   Salt intake. When you have HF, you need to limit the amount of salt you eat. Eat less than 1500 milligrams (mg) of salt per day or as recommended by your caregiver.   Weight monitoring. Weigh yourself every day. You should weigh yourself  in the morning after you urinate and before you eat breakfast. Wear the same amount of clothing each time you weigh yourself. Record your weight daily. Bring your recorded weights to your clinic visits. Tell your caregiver right away if you have gained 3 lb/1.4 kg in 1 day, or 5 lb/2.3 kg in a week or whatever amount you were told to report.   Blood pressure monitoring. This should be done as directed by your caregiver. A home blood pressure cuff can be purchased at a drugstore. Record your blood pressure numbers and bring them to your clinic visits. Tell your caregiver if you become dizzy or lightheaded upon standing up.   Smoking. If you are currently a smoker, it is time to quit. Nicotine makes your heart work harder by causing your blood vessels to constrict. Do not use nicotine gum or patches before talking to your caregiver.   Follow up. Be sure to schedule a follow-up visit with your caregiver. Keep all your appointments.  SEEK MEDICAL CARE IF:   Your weight increases by 3  lb/1.4 kg in 1 day or 5 lb/2.3 kg in a week.   You notice increasing shortness of breath that is unusual for you. This may happen during rest, sleep, or with activity.   You cough more than normal, especially with physical activity.   You notice more swelling in your hands, feet, ankles, or belly (abdomen).   You are unable to sleep because it is hard to breathe.   You cough up bloody mucus (sputum).   You begin to feel "jumping" or "fluttering" sensations (palpitations) in your chest.  SEEK IMMEDIATE MEDICAL CARE IF:   You have severe chest pain or pressure which may include symptoms such as:   Pain or pressure in the arms, neck, jaw, or back.   Feeling sweaty.   Feeling sick to your stomach (nauseous).   Feeling short of breath while at rest.   Having a fast or irregular heartbeat.   You experience stroke symptoms. These symptoms include:   Facial weakness or numbness.   Weakness or numbness in an arm, leg, or on one side of your body.   Blurred vision.   Difficulty talking or thinking.   Dizziness or fainting.   Severe headache.  THESE ARE MEDICAL EMERGENCIES. Do not wait to see if the symptoms go away. Call your local emergency services (911 in [place] DO NOT drive yourself to the hospital. IMPORTANT  Make a list of every medicine, vitamin, or herbal supplement you are taking. Keep the list with you at all times. Show it to your caregiver at every visit. Keep the list up-to-date.   Ask your caregiver or pharmacist to write an explanation of each medicine you are taking. This should include:   Why you are taking it.   The possible side effects.   The best time of day to take it.   Foods to take with it or what foods to avoid.   When to stop taking it.  MAKE SURE YOU:   Understand these instructions.   Will watch your condition.   Will get help right away if you are not doing well or get worse.  Document Released: 07/24/2005 Document Revised: 07/13/2011  Document Reviewed: 11/05/2009 [facility] Patient Information 2012 [place], [place].

## 2012-01-03 ENCOUNTER — Encounter: Payer: Self-pay | Admitting: Internal Medicine

## 2012-01-03 ENCOUNTER — Ambulatory Visit (INDEPENDENT_AMBULATORY_CARE_PROVIDER_SITE_OTHER): Payer: Medicare Other | Admitting: Internal Medicine

## 2012-01-03 VITALS — BP 118/70 | HR 71 | Temp 97.6°F | Ht 66.0 in | Wt 226.0 lb

## 2012-01-03 DIAGNOSIS — R0600 Dyspnea, unspecified: Secondary | ICD-10-CM

## 2012-01-03 DIAGNOSIS — R0989 Other specified symptoms and signs involving the circulatory and respiratory systems: Secondary | ICD-10-CM

## 2012-01-03 DIAGNOSIS — R0602 Shortness of breath: Secondary | ICD-10-CM

## 2012-01-03 NOTE — Assessment & Plan Note (Signed)
Difficult situation with shortness of breath  Your heart, weight, lack of fitness, diabetes, all contributing to shortness of breath I do not think there is lung disease per se and the abnormal breathing test probably reflects your weight  Unfortunately there is no magic bullet Continue cardiology advice Because you feel strongly that advair helps you, we will honor that and give you advair 1 puff twice daily and albuterol as needed   - my nurse will check if you need refills We will refer you to pulmonary rehab Followup 2 months or sooner if needed with full PFT

## 2012-01-03 NOTE — Progress Notes (Signed)
Subjective:    Patient ID: Mark French, male    DOB: 26-Dec-1945, 66 y.o.   MRN: [id number]  HPI IOV 01/03/2012  66 year old Body mass index is 36.48 kg/(m^2).  reports that he has never smoked. He has never used smokeless tobacco. 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  CAD and s/p CABG 1998 and now  MI in Sep 07, 2011 and s/p stent (cardiac arrest for '20 sec'). Went to [facility] nursing home and since then reports dyspnea on exertion. Progressive. Insidious onset. Also using roller assist to walk since MI in Jan 2013 but this is due to hip issues and diabetic retinopathy. Wife and he report associated significant deconditioning since Jan 2013. This is improving but not to the desired extent. Denies associated smoking or snoring. Dyspnea is associtaed with orthopnea and paroxysnal nocturnal dyspnea but this is chronic > 15 years and unchangd since jan 2013. HDyspnea is aggravated by "anything" which is at random and even at rest. Dyspnea relieved by advair disk started early maay 2013.   VQ scan 12/01/11 IMPRESSION:  Very low probability for pulmonary embolism.  12/05/11: Nuc med stress test Overall Impression: Abnormal stress nuclear study with a large, severe, fixed inferolateral defect consistent with previous infarct; no ischemia.  LV Ejection Fraction: 25% (note on Sep 07, 2011 ef 45% but no prior data available). LV Wall Motion: Inferolateral akinesis. Cards thought it was worse and meds adjusted. Per family no repepat cath due to renal function concern   Spirometry 12/20/11  - fev1 1.75L/53%, FVC 2.25L/73% and c/w restriction  Labs 12/31/11  creat 1.4mg %  hgb 11.9gm%  CXR 01/01/12  Clinical Data: Shortness of breath, worsening today. Left shoulder  pain.  CHEST - 2 VIEW  Comparison: 12/14/2011  Findings: Stable appearance of postoperative changes in the  mediastinum. Borderline heart size with normal pulmonary    vascularity. No focal airspace consolidation or edema in the  lungs. No blunting of costophrenic angles. No pneumothorax.  Degenerative changes in the thoracic spine. Mild hyperinflation.  No significant change since previous study.  IMPRESSION:  No evidence of active pulmonary disease.  Original Report Authenticated By: Marlon Pel, M  Past Medical History  Diagnosis Date  . CORONARY ARTERY DISEASE 03/11/2007    s/p CABG 1998;  admitted 08/2011 with an inferior STEMI.  LHC 09/07/11: Severe  Three-vessel CAD, LIMA-LAD patent, SVG-RCA chronically occluded, SVG-circumflex chronically occluded, SVG-diagonal 95%.  PCI: Promus DES to the SVG-diagonal.  Procedure c/b transient CHB req CPR and TTVP;  myoview 4/13:large severe inferolateral infarct, no ischemia, EF 25%    . DIABETES MELLITUS, TYPE I 03/11/2007  . HYPERTENSION 03/11/2007  . BLINDNESS 05/21/2008  . Proliferative diabetic retinopathy 03/09/2009  . HYPERCHOLESTEROLEMIA 09/28/2008  . CERVICAL RADICULOPATHY, LEFT 09/23/2008  . ANEMIA-IRON DEFICIENCY 09/23/2008  . DEPRESSION 09/23/2008  . PERIPHERAL NEUROPATHY 09/23/2008  . Chronic systolic heart failure 09/23/2008    Echocardiogram 09/07/11: Difficult study, inferior and posterior lateral wall hypokinesis, mild LVH, EF 45%, mild LAE. ;  myoview 12/04/11:  large severe inferolateral infarct, no ischemia, EF 25%  . RENAL INSUFFICIENCY 03/15/2010  . CEREBROVASCULAR ACCIDENT, HX OF 09/23/2008  . PERIPHERAL VASCULAR INSUFFICIENCY,  LEGS, BILATERAL 08/17/2010    ABI's performed 04/19/11 R 0.72 L 0.71  . ED (erectile dysfunction)   . Charcot foot due to diabetes mellitus     chronic pain  . Hypogonadism male   .  GI bleed 2012    Multiple Duodenal ulcer   . Osteoarthritis   . Obesity      Family History  Problem Relation Age of Onset  . Heart disease Mother     Coronary Artery Disease  . Heart disease Father     Coronary Artery Disease  . Heart disease Paternal Grandmother     Coronary  Artery Disease  . Colon cancer Neg Hx   . Diabetes Other     Grandmother     History   Social History  . Marital Status: Married    Spouse Name: N/A    Number of Children: 0  . Years of Education: N/A   Occupational History  . Disabled    Social History Main Topics  . Smoking status: Never Smoker   . Smokeless tobacco: Never Used  . Alcohol Use: No  . Drug Use: No  . Sexually Active: No   Other Topics Concern  . Not on file   Social History Narrative   Comes to appointments with wife0 Caffeine drinks daily      Allergies  Allergen Reactions  . Contrast Media (Iodinated Diagnostic Agents) Nausea Only  . Sildenafil Nausea Only and Other (See Comments)     Outpatient Prescriptions Prior to Visit  Medication Sig Dispense Refill  . albuterol (PROVENTIL HFA;VENTOLIN HFA) 108 (90 BASE) MCG/ACT inhaler Inhale 2 puffs into the lungs every 6 (six) hours as needed for wheezing.  1 Inhaler  0  . aspirin 81 MG chewable tablet Chew 81 mg by mouth daily.      . carvedilol (COREG) 6.25 MG tablet Take 6.25 mg by mouth 2 (two) times daily with a meal.      . clopidogrel (PLAVIX) 75 MG tablet Take 75 mg by mouth daily with breakfast.      . clotrimazole-betamethasone (LOTRISONE) cream Apply 1 application topically 2 (two) times daily as needed. For rash      . Docusate Sodium (STOOL SOFTENER) 100 MG capsule Take 100 mg by mouth daily as needed. constipation      . Fluticasone-Salmeterol (ADVAIR DISKUS) 100-50 MCG/DOSE AEPB Inhale 1 puff into the lungs 2 (two) times daily.  1 each  11  . furosemide (LASIX) 80 MG tablet Take 80 mg by mouth 2 (two) times daily. May take an extra 80 mg as needed      . gabapentin (NEURONTIN) 300 MG capsule Take 1 capsule (300 mg total) by mouth 2 (two) times daily.  60 capsule  5  . HYDROcodone-acetaminophen (NORCO) 10-325 MG per tablet Take 1-2 tablets by mouth every 4 (four) hours as needed. For pain      . insulin NPH (HUMULIN N,NOVOLIN N) 100 UNIT/ML  injection Inject 50 Units into the skin daily before breakfast.       . losartan (COZAAR) 50 MG tablet Take 50 mg by mouth daily.      . nitroGLYCERIN (NITROSTAT) 0.4 MG SL tablet Place 1 tablet (0.4 mg total) under the tongue every 5 (five) minutes as needed. For chest pain  25 tablet  3  . NOVOLOG FLEXPEN 100 UNIT/ML injection Inject into the skin 3 (three) times daily as needed. Sliding scale      . omeprazole (PRILOSEC) 20 MG capsule TAKE 1 CAPSULE BY MOUTH DAILY  30 capsule  3  . potassium chloride SA (K-DUR,KLOR-CON) 20 MEQ tablet Take 20 mEq by mouth daily.      . rosuvastatin (CRESTOR) 20 MG tablet Take 20 mg  by mouth daily.      [name] spironolactone (ALDACTONE) 25 MG tablet Take 12.5 mg by mouth daily.      . Tamsulosin HCl (FLOMAX) 0.4 MG CAPS Take 0.4 mg by mouth daily.            Review of Systems  Constitutional: Negative for fever and unexpected weight change.  HENT: Negative for ear pain, nosebleeds, congestion, sore throat, rhinorrhea, sneezing, trouble swallowing, dental problem, postnasal drip and sinus pressure.   Eyes: Negative for redness and itching.  Respiratory: Positive for shortness of breath. Negative for cough, chest tightness and wheezing.   Cardiovascular: Positive for palpitations and leg swelling.  Gastrointestinal: Negative for nausea and vomiting.  Genitourinary: Negative for dysuria.  Musculoskeletal: Negative for joint swelling.  Skin: Negative for rash.  Neurological: Negative for headaches.  Hematological: Does not bruise/bleed easily.  Psychiatric/Behavioral: Negative for dysphoric mood. The patient is not nervous/anxious.        Objective:   Physical Exam  Nursing note and vitals reviewed. Constitutional: He is oriented to person, place, and time. He appears well-developed and well-nourished. No distress.       Obese Legally blind  - sitting with sunglasses Looks deconditioned  HENT:  Head: Normocephalic and atraumatic.  Right Ear: External  ear normal.  Left Ear: External ear normal.  Mouth/Throat: Oropharynx is clear and moist. No oropharyngeal exudate.  Eyes: Conjunctivae and EOM are normal. Pupils are equal, round, and reactive to light. Right eye exhibits no discharge. Left eye exhibits no discharge. No scleral icterus.  Neck: Normal range of motion. Neck supple. No JVD present. No tracheal deviation present. No thyromegaly present.  Cardiovascular: Normal rate, regular rhythm and intact distal pulses.  Exam reveals no gallop and no friction rub.   No murmur heard. Pulmonary/Chest: Effort normal and breath sounds normal. No respiratory distress. He has no wheezes. He has no rales. He exhibits no tenderness.  Abdominal: Soft. Bowel sounds are normal. He exhibits no distension and no mass. There is no tenderness. There is no rebound and no guarding.  Musculoskeletal: Normal range of motion. He exhibits no edema and no tenderness.       Using rollator to walk  Lymphadenopathy:    He has no cervical adenopathy.  Neurological: He is alert and oriented to person, place, and time. He has normal reflexes. No cranial nerve deficit. Coordination normal.  Skin: Skin is warm and dry. No rash noted. He is not diaphoretic. No erythema. No pallor.  Psychiatric: He has a normal mood and affect. His behavior is normal. Judgment and thought content normal.          Assessment & Plan:

## 2012-01-03 NOTE — Patient Instructions (Addendum)
Difficult situation with shortness of breath  Your heart, weight, lack of fitness, diabetes, all contributing to shortness of breath I do not think there is lung disease per se and the abnormal breathing test probably reflects your weight  Unfortunately there is no magic bullet Continue cardiology advice Because you feel strongly that advair helps you, we will honor that and give you advair 1 puff twice daily and albuterol as needed   - my nurse will check if you need refills We will refer you to pulmonary rehab Followup 2 months or sooner if needed with full PFT 

## 2012-01-09 ENCOUNTER — Other Ambulatory Visit: Payer: Self-pay | Admitting: Endocrinology

## 2012-01-09 NOTE — Progress Notes (Signed)
Addended by: Reine Just on: 01/09/2012 06:55 PM   Modules accepted: Orders

## 2012-01-11 ENCOUNTER — Encounter (HOSPITAL_COMMUNITY): Payer: Self-pay | Admitting: Emergency Medicine

## 2012-01-11 ENCOUNTER — Emergency Department (HOSPITAL_COMMUNITY): Payer: Medicare Other

## 2012-01-11 ENCOUNTER — Inpatient Hospital Stay (HOSPITAL_COMMUNITY)
Admission: EM | Admit: 2012-01-11 | Discharge: 2012-01-13 | DRG: 303 | Disposition: A | Discharge status: Home / Self Care | Payer: Medicare Other | Source: Ambulatory Visit | Attending: Cardiology | Admitting: Cardiology

## 2012-01-11 DIAGNOSIS — I129 Hypertensive chronic kidney disease with stage 1 through stage 4 chronic kidney disease, or unspecified chronic kidney disease: Secondary | ICD-10-CM | POA: Diagnosis present

## 2012-01-11 DIAGNOSIS — I200 Unstable angina: Secondary | ICD-10-CM | POA: Diagnosis present

## 2012-01-11 DIAGNOSIS — I509 Heart failure, unspecified: Secondary | ICD-10-CM | POA: Diagnosis present

## 2012-01-11 DIAGNOSIS — N182 Chronic kidney disease, stage 2 (mild): Secondary | ICD-10-CM | POA: Diagnosis present

## 2012-01-11 DIAGNOSIS — E785 Hyperlipidemia, unspecified: Secondary | ICD-10-CM | POA: Diagnosis present

## 2012-01-11 DIAGNOSIS — Z7982 Long term (current) use of aspirin: Secondary | ICD-10-CM

## 2012-01-11 DIAGNOSIS — I5023 Acute on chronic systolic (congestive) heart failure: Secondary | ICD-10-CM

## 2012-01-11 DIAGNOSIS — R079 Chest pain, unspecified: Secondary | ICD-10-CM

## 2012-01-11 DIAGNOSIS — Z951 Presence of aortocoronary bypass graft: Secondary | ICD-10-CM

## 2012-01-11 DIAGNOSIS — I10 Essential (primary) hypertension: Secondary | ICD-10-CM | POA: Diagnosis present

## 2012-01-11 DIAGNOSIS — I5022 Chronic systolic (congestive) heart failure: Secondary | ICD-10-CM | POA: Diagnosis present

## 2012-01-11 DIAGNOSIS — E669 Obesity, unspecified: Secondary | ICD-10-CM | POA: Diagnosis present

## 2012-01-11 DIAGNOSIS — E7800 Pure hypercholesterolemia, unspecified: Secondary | ICD-10-CM | POA: Diagnosis present

## 2012-01-11 DIAGNOSIS — E119 Type 2 diabetes mellitus without complications: Secondary | ICD-10-CM | POA: Diagnosis present

## 2012-01-11 DIAGNOSIS — I2510 Atherosclerotic heart disease of native coronary artery without angina pectoris: Secondary | ICD-10-CM

## 2012-01-11 DIAGNOSIS — R0602 Shortness of breath: Secondary | ICD-10-CM

## 2012-01-11 DIAGNOSIS — Z8249 Family history of ischemic heart disease and other diseases of the circulatory system: Secondary | ICD-10-CM

## 2012-01-11 DIAGNOSIS — D649 Anemia, unspecified: Secondary | ICD-10-CM | POA: Diagnosis present

## 2012-01-11 DIAGNOSIS — G4733 Obstructive sleep apnea (adult) (pediatric): Secondary | ICD-10-CM | POA: Diagnosis present

## 2012-01-11 DIAGNOSIS — I2589 Other forms of chronic ischemic heart disease: Secondary | ICD-10-CM | POA: Diagnosis present

## 2012-01-11 DIAGNOSIS — N259 Disorder resulting from impaired renal tubular function, unspecified: Secondary | ICD-10-CM | POA: Diagnosis present

## 2012-01-11 HISTORY — DX: Personal history of other medical treatment: Z92.89

## 2012-01-11 HISTORY — DX: Legal blindness, as defined in USA: H54.8

## 2012-01-11 LAB — COMPREHENSIVE METABOLIC PANEL
AST: 20 U/L (ref 0–37)
Albumin: 3.7 g/dL (ref 3.5–5.2)
Alkaline Phosphatase: 70 U/L (ref 39–117)
Chloride: 95 mEq/L — ABNORMAL LOW (ref 96–112)
Potassium: 4.6 mEq/L (ref 3.5–5.1)
Total Bilirubin: 0.3 mg/dL (ref 0.3–1.2)

## 2012-01-11 LAB — CARDIAC PANEL(CRET KIN+CKTOT+MB+TROPI)
CK, MB: 5 ng/mL — ABNORMAL HIGH (ref 0.3–4.0)
Relative Index: 1.8 (ref 0.0–2.5)
Total CK: 273 U/L — ABNORMAL HIGH (ref 7–232)
Troponin I: <0.3 ng/mL (ref <0.30)

## 2012-01-11 LAB — CBC
Platelets: 175 10*3/uL (ref 150–400)
RDW: 14.1 % (ref 11.5–15.5)
WBC: 7.2 10*3/uL (ref 4.0–10.5)

## 2012-01-11 LAB — PROTIME-INR: INR: 0.98 (ref 0.00–1.49)

## 2012-01-11 LAB — POCT I-STAT TROPONIN I

## 2012-01-11 MED ORDER — SODIUM CHLORIDE 0.9 % IV SOLN
1000.0000 mL | INTRAVENOUS | Status: DC
  Administered 2012-01-11: 1000 mL via INTRAVENOUS

## 2012-01-11 MED ORDER — TAMSULOSIN HCL 0.4 MG PO CAPS
0.4000 mg | ORAL_CAPSULE | Freq: Every day | ORAL | Status: DC
  Administered 2012-01-12 – 2012-01-13 (×2): 0.4 mg via ORAL
  Filled 2012-01-11 (×2): qty 1

## 2012-01-11 MED ORDER — HYDROCODONE-ACETAMINOPHEN 10-325 MG PO TABS
1.0000 | ORAL_TABLET | ORAL | Status: DC | PRN
  Administered 2012-01-11 – 2012-01-12 (×3): 1 via ORAL
  Administered 2012-01-12: 2 via ORAL
  Administered 2012-01-12: 1 via ORAL
  Administered 2012-01-13: 2 via ORAL
  Filled 2012-01-11: qty 2
  Filled 2012-01-11: qty 1
  Filled 2012-01-11: qty 2
  Filled 2012-01-11 (×3): qty 1

## 2012-01-11 MED ORDER — ONDANSETRON HCL 4 MG/2ML IJ SOLN: 4.0000 mg | Freq: Four times a day (QID) | INTRAMUSCULAR | Status: DC | PRN

## 2012-01-11 MED ORDER — SPIRONOLACTONE 12.5 MG HALF TABLET
12.5000 mg | ORAL_TABLET | Freq: Every day | ORAL | Status: DC
  Administered 2012-01-12 – 2012-01-13 (×2): 12.5 mg via ORAL
  Filled 2012-01-11 (×2): qty 1

## 2012-01-11 MED ORDER — ASPIRIN 81 MG PO CHEW: 324.0000 mg | CHEWABLE_TABLET | Freq: Once | ORAL | Status: DC

## 2012-01-11 MED ORDER — SODIUM CHLORIDE 0.9 % IJ SOLN
3.0000 mL | Freq: Two times a day (BID) | INTRAMUSCULAR | Status: DC
  Administered 2012-01-12 (×2): 3 mL via INTRAVENOUS

## 2012-01-11 MED ORDER — SODIUM CHLORIDE 0.9 % IJ SOLN: 3.0000 mL | INTRAMUSCULAR | Status: DC | PRN

## 2012-01-11 MED ORDER — HEPARIN BOLUS VIA INFUSION
4000.0000 [IU] | Freq: Once | INTRAVENOUS | Status: AC
  Administered 2012-01-11: 4000 [IU] via INTRAVENOUS

## 2012-01-11 MED ORDER — INSULIN ASPART 100 UNIT/ML ~~LOC~~ SOLN
0.0000 [IU] | Freq: Three times a day (TID) | SUBCUTANEOUS | Status: DC
  Administered 2012-01-12 – 2012-01-13 (×5): 3 [IU] via SUBCUTANEOUS

## 2012-01-11 MED ORDER — PANTOPRAZOLE SODIUM 40 MG PO TBEC
40.0000 mg | DELAYED_RELEASE_TABLET | Freq: Every day | ORAL | Status: DC
  Administered 2012-01-12 – 2012-01-13 (×2): 40 mg via ORAL
  Filled 2012-01-11 (×2): qty 1

## 2012-01-11 MED ORDER — FLUTICASONE-SALMETEROL 100-50 MCG/DOSE IN AEPB
1.0000 | INHALATION_SPRAY | Freq: Two times a day (BID) | RESPIRATORY_TRACT | Status: DC
  Administered 2012-01-11 – 2012-01-13 (×3): 1 via RESPIRATORY_TRACT
  Filled 2012-01-11: qty 14

## 2012-01-11 MED ORDER — ASPIRIN 81 MG PO TABS: 81.0000 mg | ORAL_TABLET | Freq: Every day | ORAL | Status: DC

## 2012-01-11 MED ORDER — CARVEDILOL 6.25 MG PO TABS
6.2500 mg | ORAL_TABLET | Freq: Two times a day (BID) | ORAL | Status: DC
  Administered 2012-01-11 – 2012-01-13 (×4): 6.25 mg via ORAL
  Filled 2012-01-11 (×7): qty 1

## 2012-01-11 MED ORDER — DOCUSATE SODIUM 100 MG PO TABS: 100.0000 mg | ORAL_TABLET | Freq: Every day | ORAL | Status: DC | PRN

## 2012-01-11 MED ORDER — INSULIN NPH (HUMAN) (ISOPHANE) 100 UNIT/ML ~~LOC~~ SUSP
50.0000 [IU] | Freq: Every day | SUBCUTANEOUS | Status: DC
  Administered 2012-01-12 – 2012-01-13 (×2): 50 [IU] via SUBCUTANEOUS
  Filled 2012-01-11: qty 10

## 2012-01-11 MED ORDER — ALBUTEROL SULFATE HFA 108 (90 BASE) MCG/ACT IN AERS
2.0000 | INHALATION_SPRAY | Freq: Four times a day (QID) | RESPIRATORY_TRACT | Status: DC | PRN
  Administered 2012-01-12: 2 via RESPIRATORY_TRACT
  Filled 2012-01-11: qty 6.7

## 2012-01-11 MED ORDER — NITROGLYCERIN 0.4 MG SL SUBL: 0.4000 mg | SUBLINGUAL_TABLET | SUBLINGUAL | Status: DC | PRN

## 2012-01-11 MED ORDER — ASPIRIN 81 MG PO CHEW
81.0000 mg | CHEWABLE_TABLET | Freq: Every day | ORAL | Status: DC
  Administered 2012-01-12 – 2012-01-13 (×2): 81 mg via ORAL
  Filled 2012-01-11 (×3): qty 1

## 2012-01-11 MED ORDER — ATORVASTATIN CALCIUM 40 MG PO TABS
40.0000 mg | ORAL_TABLET | Freq: Every day | ORAL | Status: DC
  Administered 2012-01-12: 40 mg via ORAL
  Filled 2012-01-11 (×2): qty 1

## 2012-01-11 MED ORDER — ACETAMINOPHEN 325 MG PO TABS: 650.0000 mg | ORAL_TABLET | ORAL | Status: DC | PRN

## 2012-01-11 MED ORDER — DOCUSATE SODIUM 100 MG PO CAPS
100.0000 mg | ORAL_CAPSULE | Freq: Every day | ORAL | Status: DC | PRN
  Filled 2012-01-11: qty 1

## 2012-01-11 MED ORDER — FUROSEMIDE 80 MG PO TABS
80.0000 mg | ORAL_TABLET | Freq: Two times a day (BID) | ORAL | Status: DC
  Administered 2012-01-11 – 2012-01-13 (×4): 80 mg via ORAL
  Filled 2012-01-11 (×6): qty 1

## 2012-01-11 MED ORDER — CLOPIDOGREL BISULFATE 75 MG PO TABS
75.0000 mg | ORAL_TABLET | Freq: Every day | ORAL | Status: DC
  Administered 2012-01-12 – 2012-01-13 (×2): 75 mg via ORAL
  Filled 2012-01-11 (×2): qty 1

## 2012-01-11 MED ORDER — HEPARIN (PORCINE) IN NACL 100-0.45 UNIT/ML-% IJ SOLN
1150.0000 [IU]/h | INTRAMUSCULAR | Status: DC
  Administered 2012-01-11: 1150 [IU]/h via INTRAVENOUS
  Filled 2012-01-11 (×4): qty 250

## 2012-01-11 MED ORDER — SODIUM CHLORIDE 0.9 % IV SOLN: 250.0000 mL | INTRAVENOUS | Status: DC | PRN

## 2012-01-11 MED ORDER — LOSARTAN POTASSIUM 50 MG PO TABS
50.0000 mg | ORAL_TABLET | Freq: Every day | ORAL | Status: DC
  Administered 2012-01-12 – 2012-01-13 (×2): 50 mg via ORAL
  Filled 2012-01-11 (×2): qty 1

## 2012-01-11 MED ORDER — GABAPENTIN 300 MG PO CAPS
300.0000 mg | ORAL_CAPSULE | Freq: Two times a day (BID) | ORAL | Status: DC
  Administered 2012-01-11 – 2012-01-13 (×4): 300 mg via ORAL
  Filled 2012-01-11 (×5): qty 1

## 2012-01-11 MED ORDER — NITROGLYCERIN 2 % TD OINT
1.0000 [in_us] | TOPICAL_OINTMENT | Freq: Four times a day (QID) | TRANSDERMAL | Status: DC
  Administered 2012-01-11 – 2012-01-13 (×7): 1 [in_us] via TOPICAL
  Filled 2012-01-11: qty 30

## 2012-01-11 MED ORDER — NITROGLYCERIN IN D5W 200-5 MCG/ML-% IV SOLN
10.0000 ug/min | INTRAVENOUS | Status: DC
  Administered 2012-01-11: 5 ug/min via INTRAVENOUS
  Filled 2012-01-11: qty 250

## 2012-01-11 MED ORDER — POTASSIUM CHLORIDE CRYS ER 20 MEQ PO TBCR
20.0000 meq | EXTENDED_RELEASE_TABLET | Freq: Every day | ORAL | Status: DC
  Administered 2012-01-12 – 2012-01-13 (×2): 20 meq via ORAL
  Filled 2012-01-11 (×2): qty 1

## 2012-01-11 NOTE — ED Notes (Signed)
CMP not drawn by me. Clicked by accident

## 2012-01-11 NOTE — ED Notes (Signed)
Attempted to call report; floor unable to accept to call back 

## 2012-01-11 NOTE — ED Provider Notes (Signed)
History     CSN: [id number]  Arrival date & time 01/11/12  1243   First MD Initiated Contact with Patient 01/11/12 1247      Chief Complaint  Patient presents with  . Chest Pain     Patient is a 66 y.o. male presenting with chest pain. The history is provided by the patient.  Chest Pain The chest pain began 3 - 5 hours ago (it started at 0730.  he thought it could be indegestion). Duration of episode(s) is 5 hours. Chest pain occurs constantly. The chest pain is improving (after ntg by ems). At its most intense, the pain is at 7/10. The pain is currently at 2/10. The quality of the pain is described as aching and similar to previous episodes. The pain does not radiate. Exacerbated by: nothing. Pertinent negatives for primary symptoms include no shortness of breath and no nausea.  Pertinent negatives for associated symptoms include no diaphoresis.  His past medical history is significant for CAD and MI.  Procedure history is positive for cardiac catheterization.   Recent stent January 2013.  Cardiologist Dr Excell Seltzer  Past Medical History  Diagnosis Date  . CORONARY ARTERY DISEASE 03/11/2007    s/p CABG 1998;  admitted 08/2011 with an inferior STEMI.  LHC 09/07/11: Severe  Three-vessel CAD, LIMA-LAD patent, SVG-RCA chronically occluded, SVG-circumflex chronically occluded, SVG-diagonal 95%.  PCI: Promus DES to the SVG-diagonal.  Procedure c/b transient CHB req CPR and TTVP;  myoview 4/13:large severe inferolateral infarct, no ischemia, EF 25%    . DIABETES MELLITUS, TYPE I 03/11/2007  . HYPERTENSION 03/11/2007  . BLINDNESS 05/21/2008  . Proliferative diabetic retinopathy 03/09/2009  . HYPERCHOLESTEROLEMIA 09/28/2008  . CERVICAL RADICULOPATHY, LEFT 09/23/2008  . ANEMIA-IRON DEFICIENCY 09/23/2008  . DEPRESSION 09/23/2008  . PERIPHERAL NEUROPATHY 09/23/2008  . Chronic systolic heart failure 09/23/2008    Echocardiogram 09/07/11: Difficult study, inferior and posterior lateral wall hypokinesis, mild LVH,  EF 45%, mild LAE. ;  myoview 12/04/11:  large severe inferolateral infarct, no ischemia, EF 25%  . RENAL INSUFFICIENCY 03/15/2010  . CEREBROVASCULAR ACCIDENT, HX OF 09/23/2008  . PERIPHERAL VASCULAR INSUFFICIENCY,  LEGS, BILATERAL 08/17/2010    ABI's performed 04/19/11 R 0.72 L 0.71  . ED (erectile dysfunction)   . Charcot foot due to diabetes mellitus     chronic pain  . Hypogonadism male   . GI bleed 2012    Multiple Duodenal ulcer   . Osteoarthritis   . Obesity     Past Surgical History  Procedure Date  . Coronary artery bypass graft   . Carpal tunnel release     left  . Wrist surgery     s/p right-bone graft fusion  . Spine surgery 2010    C spine surgery for disc Dr. Yetta Barre  . Sigmoidoscopy 03/11/2001  . Venous doppler 01/30/2004  . Cataract surgery   . Ankle surgery     left  . Foot surgery     left  . Cardiac catheterization   . Eye surgery   . Fracture surgery     Family History  Problem Relation Age of Onset  . Heart disease Mother     Coronary Artery Disease  . Heart disease Father     Coronary Artery Disease  . Heart disease Paternal Grandmother     Coronary Artery Disease  . Colon cancer Neg Hx   . Diabetes Other     Grandmother    History  Substance Use Topics  . Smoking status: Never  Smoker   . Smokeless tobacco: Never Used  . Alcohol Use: No      Review of Systems  Constitutional: Negative for diaphoresis.  Respiratory: Negative for shortness of breath.   Cardiovascular: Positive for chest pain.  Gastrointestinal: Negative for nausea.  All other systems reviewed and are negative.    Allergies  Contrast media and Sildenafil  Home Medications   Current Outpatient Rx  Name Route Sig Dispense Refill  . ASPIRIN 81 MG PO CHEW Oral Chew 81 mg by mouth daily.    [name] CARVEDILOL 6.25 MG PO TABS Oral Take 6.25 mg by mouth 2 (two) times daily with a meal.    . CLOPIDOGREL BISULFATE 75 MG PO TABS Oral Take 75 mg by mouth daily with breakfast.    .  CLOTRIMAZOLE-BETAMETHASONE 1-0.05 % EX CREA Topical Apply 1 application topically 2 (two) times daily as needed. For rash    . DOCUSATE SODIUM 100 MG PO TABS Oral Take 100 mg by mouth daily as needed. constipation    . FLUTICASONE-SALMETEROL 100-50 MCG/DOSE IN AEPB Inhalation Inhale 1 puff into the lungs 2 (two) times daily. 1 each 11  . FUROSEMIDE 80 MG PO TABS Oral Take 80 mg by mouth 2 (two) times daily. May take an extra 80 mg as needed    . GABAPENTIN 300 MG PO CAPS Oral Take 1 capsule (300 mg total) by mouth 2 (two) times daily. 60 capsule 5  . HYDROCODONE-ACETAMINOPHEN 10-325 MG PO TABS Oral Take 1-2 tablets by mouth every 4 (four) hours as needed. For pain    . INSULIN ISOPHANE HUMAN 100 UNIT/ML [place] SUSP Subcutaneous Inject 50 Units into the skin daily before breakfast.     . LOSARTAN POTASSIUM 50 MG PO TABS Oral Take 50 mg by mouth daily.    [name] NITROGLYCERIN 0.4 MG SL SUBL Sublingual Place 1 tablet (0.4 mg total) under the tongue every 5 (five) minutes as needed. For chest pain 25 tablet 3  . NOVOLOG FLEXPEN 100 UNIT/ML [place] SOLN Subcutaneous Inject into the skin 3 (three) times daily as needed. Sliding scale    . OMEPRAZOLE 20 MG PO CPDR  TAKE 1 CAPSULE BY MOUTH DAILY 30 capsule 3  . POTASSIUM CHLORIDE CRYS ER 20 MEQ PO TBCR Oral Take 20 mEq by mouth daily.    [name] PROAIR HFA 108 (90 BASE) MCG/ACT IN AERS  INHALE 2 PUFFS INTO THE LUNGS EVERY 6 HOURS AS NEEDED FOR WHEEZING 8.5 g 1  . ROSUVASTATIN CALCIUM 20 MG PO TABS Oral Take 20 mg by mouth daily.    [name] SPIRONOLACTONE 25 MG PO TABS Oral Take 12.5 mg by mouth daily.    [name] TAMSULOSIN HCL 0.4 MG PO CAPS Oral Take 0.4 mg by mouth daily.      There were no vitals taken for this visit.  Physical Exam  Nursing note and vitals reviewed. Constitutional: He appears well-developed and well-nourished. No distress.  HENT:  Head: Normocephalic and atraumatic.  Right Ear: External ear normal.  Left Ear: External ear normal.       Legally blind  Eyes:  Conjunctivae are normal. Right eye exhibits no discharge. Left eye exhibits no discharge. No scleral icterus.  Neck: Neck supple. No tracheal deviation present.  Cardiovascular: Normal rate, regular rhythm and intact distal pulses.   Pulmonary/Chest: Effort normal and breath sounds normal. No stridor. No respiratory distress. He has no wheezes. He has no rales.  Abdominal: Soft. Bowel sounds are normal. He exhibits no distension. There  is no tenderness. There is no rebound and no guarding.  Musculoskeletal: He exhibits edema. He exhibits no tenderness.       Bilateral le edema, atrophy rue  Neurological: He is alert. He has normal strength. No sensory deficit. Cranial nerve deficit:  no gross defecits noted. He exhibits normal muscle tone. He displays no seizure activity. Coordination normal.  Skin: Skin is warm and dry. No rash noted.  Psychiatric: He has a normal mood and affect.    ED Course  Procedures (including critical care time)  Rate: 77  Rhythm: normal sinus rhythm  QRS Axis: Left  Intervals: normal  ST/T Wave abnormalities: normal  Conduction Disutrbances: First degree AV block, left anterior fascicular block, right bundle branch block  Narrative Interpretation: Normal  Old EKG Reviewed: No significant change since last tracing  Labs Reviewed  CBC - Abnormal; Notable for the following:    Hemoglobin 11.9 (*)    HCT 36.9 (*)    All other components within normal limits  COMPREHENSIVE METABOLIC PANEL - Abnormal; Notable for the following:    Sodium 134 (*)    Chloride 95 (*)    BUN 26 (*)    Creatinine, Ser 1.48 (*)    GFR calc non Af Amer 47 (*)    GFR calc Af Amer 54 (*)    All other components within normal limits  PROTIME-INR  APTT  POCT I-STAT TROPONIN I   Dg Chest Portable 1 View  01/11/2012  *RADIOLOGY REPORT*  Clinical Data: Chest pain.  PORTABLE CHEST - 1 VIEW  Comparison: 01/01/2012.  Findings: Normal sized heart.  Stable post CABG changes.  Clear lungs with  normal vascularity.  Unremarkable bones.  IMPRESSION: No acute abnormality.  Original Report Authenticated By: Darrol Angel, M.D.     1. Coronary artery disease   2. Chest pain       MDM  Vision is no history of coronary artery disease. He had recurrent chest pain causing him to return to the emergency room today nitroglycerin has helped with his pain and discomfort. His initial EKG and troponin are unchanged and normal respectively. However with his known history in recent cardiac stents back in January of 2013 I've asked the cardiology service to evaluate him for admission .      Celene Kras, MD 01/11/12 [phone]

## 2012-01-11 NOTE — Progress Notes (Signed)
ANTICOAGULATION CONSULT NOTE - Initial Consult  Pharmacy Consult for Heparin Indication: chest pain/ACS  Allergies  Allergen Reactions  . Sildenafil Nausea Only and Other (See Comments)    "took it once; heart went to pieces; never took it again"  . Contrast Media (Iodinated Diagnostic Agents) Nausea Only    Patient Measurements: Height: 5\' 6"  (167.6 cm) Weight: 226 lb (102.513 kg) IBW/kg (Calculated) : 63.8  Heparin Dosing Weight: 82 kg  Vital Signs: Temp: 97.7 F (36.5 C) (06/06 1404) Temp src: Oral (06/06 1404) BP: 128/52 mmHg (06/06 1638) Pulse Rate: 69  (06/06 1404)  Labs:  Basename 01/11/12 1253  HGB 11.9*  HCT 36.9*  PLT 175  APTT 34  LABPROT 13.2  INR 0.98  HEPARINUNFRC --  CREATININE 1.48*  CKTOTAL --  CKMB --  TROPONINI --    Estimated Creatinine Clearance: 53.6 ml/min (by C-G formula based on Cr of 1.48).   Medical History: Past Medical History  Diagnosis Date  . CORONARY ARTERY DISEASE 03/11/2007    s/p CABG 1998;  admitted 08/2011 with an inferior STEMI.  LHC 09/07/11: Severe  Three-vessel CAD, LIMA-LAD patent, SVG-RCA chronically occluded, SVG-circumflex chronically occluded, SVG-diagonal 95%.  PCI: Promus DES to the SVG-diagonal.  Procedure c/b transient CHB req CPR and TTVP;  myoview 4/13:large severe inferolateral infarct, no ischemia, EF 25%    . DIABETES MELLITUS, TYPE I 03/11/2007  . HYPERTENSION 03/11/2007  . Proliferative diabetic retinopathy 03/09/2009  . HYPERCHOLESTEROLEMIA 09/28/2008  . CERVICAL RADICULOPATHY, LEFT 09/23/2008  . ANEMIA-IRON DEFICIENCY 09/23/2008  . PERIPHERAL NEUROPATHY 09/23/2008  . Chronic systolic heart failure 09/23/2008    Echocardiogram 09/07/11: Difficult study, inferior and posterior lateral wall hypokinesis, mild LVH, EF 45%, mild LAE.   [name] RENAL INSUFFICIENCY 03/15/2010  . PERIPHERAL VASCULAR INSUFFICIENCY,  LEGS, BILATERAL 08/17/2010    ABI's performed 04/19/11 R 0.72 L 0.71  . ED (erectile dysfunction)   . Hypogonadism  male   . GI bleed 2012    Multiple Duodenal ulcer   . Obesity   . Legally blind     bilaterally  . Stroke 68    age 45 months; residual right sided weakness  . History of blood transfusion 2012  . Charcot foot due to diabetes mellitus     chronic pain  . Osteoarthritis     right hip and shoulder    Medications:  Home Meds: ASA 81mg , Coreg, Plavix, Docusate, Advair, Lasix, Gabapentin, Norco, NPH, Novolog, Cozaar, NTG SL, Prilosec, KCL, ProAir, Crestor, Spironolactone, Flomax  Assessment: 66 y.o. male with CAD s/p CABG 1998 and STEMI/DES January 2013 presents with chest pain.  To begin heparin. Noted plan to cycle enzymes and avoid cath if possible.  Goal of Therapy:  Heparin level 0.3-0.7 units/ml Monitor platelets by anticoagulation protocol: Yes   Plan:  1. Heparin IV bolus 4000 units 2. Heparin gtt at 1150 units/hr 3. F/u 6 hour heparin level 4. Daily heparin level and CBC  Christoper Fabian, PharmD, BCPS Clinical pharmacist, pager [phone] 01/11/2012,5:35 PM

## 2012-01-11 NOTE — H&P (Signed)
Patient ID: ARHUM PEEPLES MRN: [id number], DOB/AGE: November 01, 1945   Admit date: 01/11/2012   Primary Physician: [name], MD, MD Primary Cardiologist: Judie Petit. Excell Seltzer, MD  Pt. Profile:  66 yr old male with h/o CAD who presents with chest pain.  Problem List  Past Medical History  Diagnosis Date  . CORONARY ARTERY DISEASE 03/11/2007    s/p CABG 1998;  admitted 08/2011 with an inferior STEMI.  LHC 09/07/11: Severe  Three-vessel CAD, LIMA-LAD patent, SVG-RCA chronically occluded, SVG-circumflex chronically occluded, SVG-diagonal 95%.  PCI: Promus DES to the SVG-diagonal.  Procedure c/b transient CHB req CPR and TTVP;  myoview 4/13:large severe inferolateral infarct, no ischemia, EF 25%    . DIABETES MELLITUS, TYPE I 03/11/2007  . HYPERTENSION 03/11/2007  . Proliferative diabetic retinopathy 03/09/2009  . HYPERCHOLESTEROLEMIA 09/28/2008  . CERVICAL RADICULOPATHY, LEFT 09/23/2008  . ANEMIA-IRON DEFICIENCY 09/23/2008  . PERIPHERAL NEUROPATHY 09/23/2008  . Chronic systolic heart failure 09/23/2008    Echocardiogram 09/07/11: Difficult study, inferior and posterior lateral wall hypokinesis, mild LVH, EF 45%, mild LAE.   [name] RENAL INSUFFICIENCY 03/15/2010  . PERIPHERAL VASCULAR INSUFFICIENCY,  LEGS, BILATERAL 08/17/2010    ABI's performed 04/19/11 R 0.72 L 0.71  . ED (erectile dysfunction)   . Hypogonadism male   . GI bleed 2012    Multiple Duodenal ulcer   . Obesity   . Legally blind     bilaterally  . Stroke 83    age 25 months; residual right sided weakness  . History of blood transfusion 2012  . Charcot foot due to diabetes mellitus     chronic pain  . Osteoarthritis     right hip and shoulder    Past Surgical History  Procedure Date  . Carpal tunnel release     left  . Sigmoidoscopy 03/11/2001  . Venous doppler 01/30/2004  . Eye surgery   . Fracture surgery   . Tonsillectomy and adenoidectomy     "as a kid"  . Cataract extraction w/ intraocular lens  implant, bilateral   . Coronary  artery bypass graft 1998    CABG X5  . Coronary angioplasty with stent placement 08/2011    "1"  . Retinopathy surgery 2000's    "laser; both eyes"  . Wrist fusion 1976    right  . Anterior fusion cervical spine 2010    "C spine; Dr. Yetta Barre"  . Ankle fracture surgery 1976; ?date    LEFT:  fused; removed bulk of hardware  . Heel spur surgery ~ 2007    ? left    Allergies  Allergies  Allergen Reactions  . Sildenafil Nausea Only and Other (See Comments)    "took it once; heart went to pieces; never took it again"  . Contrast Media (Iodinated Diagnostic Agents) Nausea Only   HPI  66 yr old male with multiple medical problems outlined above. He has CAD s/p CABG 1998 and admission for inf STEMI in 1/13 @ which time [facility] severe 3VD- chronically occluded SVG to RCA & SVG to circ, and 95% occlusion in SVG to diagonal -> treated with a Promus DES.  Peri-procedural course c/b CHB requiring CPR and TTVP.  At 0730 today, he started experiencing 7/10 chest aching, somewhat similar in character but not as severe as his MI pain in January.  Ss progressed over the next 4-5 hrs and he called EMS.  EMS gave 4 ASA, 1 NTG SL  at home and  2 enroute which completely relieved his pain.  According to spouse at bedside which was confirmed by pt,  SOB has been ongoing for the past 3 months and was started on inhaler last month. He seldom uses the inhaler because SOB happens occasionally. When he sleeps in bed, he gets short of breath and so has been sleeping in his recliner at night. Spouse states that, sometimes pt will cease at night for short periods of time. Currently, he denies any chest pain, NV, dizziness, SOB or HA.  Home Medications  Prior to Admission medications   Medication Sig Start Date End Date Taking? Authorizing Provider  aspirin 81 MG tablet Take 81 mg by mouth daily.   Yes Historical Provider, MD  carvedilol (COREG) 6.25 MG tablet Take 6.25 mg by mouth 2 (two) times daily with a  meal. 11/16/11 11/15/12 Yes [name], MD  clopidogrel (PLAVIX) 75 MG tablet Take 75 mg by mouth daily with breakfast. 11/16/11 11/15/12 Yes [name], MD  Docusate Sodium (STOOL SOFTENER) 100 MG capsule Take 100 mg by mouth daily as needed. constipation   Yes Historical Provider, MD  Fluticasone-Salmeterol (ADVAIR DISKUS) 100-50 MCG/DOSE AEPB Inhale 1 puff into the lungs 2 (two) times daily. 12/25/11  Yes [name], MD  furosemide (LASIX) 80 MG tablet Take 80 mg by mouth 2 (two) times daily. May take an extra 80 mg as needed   Yes Historical Provider, MD  gabapentin (NEURONTIN) 300 MG capsule Take 1 capsule (300 mg total) by mouth 2 (two) times daily. 12/20/11 12/19/12 Yes [name], MD  HYDROcodone-acetaminophen (NORCO) 10-325 MG per tablet Take 1-2 tablets by mouth every 4 (four) hours as needed. For pain 09/15/11  Yes Rhonda G Barrett, PA  insulin NPH (HUMULIN N,NOVOLIN N) 100 UNIT/ML injection Inject 50 Units into the skin daily before breakfast.    Yes Historical Provider, MD  losartan (COZAAR) 50 MG tablet Take 50 mg by mouth daily. 04/13/11 04/12/12 Yes Tonny Bollman, MD  nitroGLYCERIN (NITROSTAT) 0.4 MG SL tablet Place 1 tablet (0.4 mg total) under the tongue every 5 (five) minutes as needed. For chest pain 11/30/11 11/29/12 Yes Scott Moishe Spice, PA  NOVOLOG FLEXPEN 100 UNIT/ML injection Inject 20 Units into the skin 3 (three) times daily as needed. Sliding scale 10/07/11  Yes Historical Provider, MD  omeprazole (PRILOSEC) 20 MG capsule TAKE 1 CAPSULE BY MOUTH DAILY 12/25/11  Yes Hart Carwin, MD  potassium chloride SA (K-DUR,KLOR-CON) 20 MEQ tablet Take 20 mEq by mouth daily.   Yes Historical Provider, MD  PROAIR HFA 108 (90 BASE) MCG/ACT inhaler INHALE 2 PUFFS INTO THE LUNGS EVERY 6 HOURS AS NEEDED FOR WHEEZING 01/09/12  Yes [name], MD  rosuvastatin (CRESTOR) 20 MG tablet Take 20 mg by mouth daily. 11/16/11  Yes [name], MD  spironolactone (ALDACTONE) 25 MG tablet Take 12.5 mg by mouth  daily. 09/15/11  Yes Rhonda G Barrett, PA  Tamsulosin HCl (FLOMAX) 0.4 MG CAPS Take 0.4 mg by mouth daily. 09/15/11  Yes Darrol Jump, PA   Family History  Family History  Problem Relation Age of Onset  . Heart disease Mother     Coronary Artery Disease  . Heart disease Father     Coronary Artery Disease  . Heart disease Paternal Grandmother     Coronary Artery Disease  . Colon cancer Neg Hx   . Diabetes Other     Grandmother   Social History  History   Social History  . Marital Status: Married    Spouse Name: N/A  Number of Children: 0  . Years of Education: N/A   Occupational History  . Disabled    Social History Main Topics  . Smoking status: Never Smoker   . Smokeless tobacco: Never Used  . Alcohol Use: No  . Drug Use: No  . Sexually Active: No   Other Topics Concern  . Not on file   Social History Narrative   Comes to appointments with wife0 Caffeine drinks daily     Review of Systems General:  No chills, fever, night sweats or weight changes.  Cardiovascular:  Mild SOB,  ++chest pain & dyspnea on exertion.  He has some degree of chronic LEE and venous stasis with ulcerations bilat.  No orthopnea, palpitations, paroxysmal nocturnal dyspnea. Dermatological: No rash, lesions/masses Respiratory: No cough, dyspnea Urologic: No hematuria, dysuria Abdominal:   No nausea, vomiting, diarrhea, bright red blood per rectum, melena, or hematemesis Neurologic:  No visual changes, wkns, changes in mental status. All other systems reviewed and are otherwise negative except as noted above.  Physical Exam  Blood pressure 117/46, pulse 69, temperature 97.7 F (36.5 C), temperature source Oral, resp. rate 15, SpO2 98.00%.  General: Pleasant, NAD Psych: Normal affect. Neuro: Alert and oriented X 3. Moves all extremities spontaneously. HEENT: Normal  Neck: Supple without bruits.  Neck is obese and difficult to assess JVP. Lungs:  Resp regular and unlabored,  CTA. Heart: RRR no s3, s4, or murmurs. Abdomen: Soft, non-tender,obese, BS + x 4.  Extremities: No clubbing or cyanosis . 1+ pitting edema with chronic venous stasis changes.   MSK:  Right arm weak.  Labs  POC Trop: 0.00  Lab Results  Component Value Date   WBC 7.2 01/11/2012   HGB 11.9* 01/11/2012   HCT 36.9* 01/11/2012   MCV 84.8 01/11/2012   PLT 175 01/11/2012     Lab 01/11/12 1253  NA 134*  K 4.6  CL 95*  CO2 28  BUN 26*  CREATININE 1.48*  CALCIUM 9.3  PROT 7.1  BILITOT 0.3  ALKPHOS 70  ALT 16  AST 20  GLUCOSE 95       Radiology/Studies  Dg Chest Portable 1 View  01/11/2012  *RADIOLOGY REPORT*  Clinical Data: Chest pain.  PORTABLE CHEST - 1 VIEW  Comparison: 01/01/2012.  Findings: Normal sized heart.  Stable post CABG changes.  Clear lungs with normal vascularity.  Unremarkable bones.  IMPRESSION: No acute abnormality.  Original Report Authenticated By: Darrol Angel, M.D.   ECG  SR with 1 degree AVB, rbbb  ASSESSMENT AND PLAN  1. [place]/CAD- Presents with chest pain progressively worse from 0730 to about noon today.  This was relieved by NTG sL. He is currently pain free on IV NTG. POC Ti is currently normal.  Will cycle CE, initiate IV heparin and admit to SDU .  Cont home meds and plan to add long acting nitrate.  If CE, plan to avoid cath given contrast allergy and renal insufficiency.  2.  HTN-  (122/49) stable. Continue losartan, bb, and aldactone  3. DM- will place on sliding scale.  Cont home regimen.  4. HL-  Continue crestor.  Check lipid profile.  5. OSA- Spouse states pt's breathing ceases for short periods of time during sleep.  Recommend sleep study as outpt.  6.  CKD II:  Stable.  Follow.  Signed, Nicolasa Ducking, NP 01/11/2012, 4:10 PM  History and all data above reviewed. The patient had chest pain at rest this am.  Somewhat different  than his previous pain. Thus far there is no objective evidence of ischemia although his baseline EKG is LBBB.   Patient examined.  I agree with the findings as above.  The patient exam reveals COR: RRR, distant heart sounds  ,  Lungs: Clear  ,  Abd: Positive bowel sounds, no rebound no guarding, Ext Decreased lower extremity pulses with chronic venous stasis changes  .  All available labs, radiology testing, previous records reviewed. Agree with documented assessment and plan. Admit.  Cycle enzymes.  I would suggest that if he has no further chest pain and no enzyme elevation he should have conservative management.  Add long acting nitrates to his home medications.  Fayrene Fearing Cypher Paule  5:03 PM  01/11/2012

## 2012-01-11 NOTE — ED Notes (Signed)
Pt c/o CP starting this am; pt sts hx of MI with stents; pt given 324mg  ASA and 3 SL nitro with some relief; pt c/o SOB; pt legally blind

## 2012-01-12 DIAGNOSIS — E7800 Pure hypercholesterolemia, unspecified: Secondary | ICD-10-CM

## 2012-01-12 DIAGNOSIS — I5022 Chronic systolic (congestive) heart failure: Secondary | ICD-10-CM

## 2012-01-12 DIAGNOSIS — I200 Unstable angina: Secondary | ICD-10-CM | POA: Diagnosis present

## 2012-01-12 DIAGNOSIS — I379 Nonrheumatic pulmonary valve disorder, unspecified: Secondary | ICD-10-CM

## 2012-01-12 DIAGNOSIS — E119 Type 2 diabetes mellitus without complications: Secondary | ICD-10-CM

## 2012-01-12 DIAGNOSIS — I2510 Atherosclerotic heart disease of native coronary artery without angina pectoris: Principal | ICD-10-CM

## 2012-01-12 LAB — GLUCOSE, CAPILLARY
Glucose-Capillary: 171 mg/dL — ABNORMAL HIGH (ref 70–99)
Glucose-Capillary: 194 mg/dL — ABNORMAL HIGH (ref 70–99)

## 2012-01-12 LAB — CBC
Hemoglobin: 11.8 g/dL — ABNORMAL LOW (ref 13.0–17.0)
MCH: 27.3 pg (ref 26.0–34.0)
Platelets: 172 10*3/uL (ref 150–400)
RBC: 4.32 MIL/uL (ref 4.22–5.81)
WBC: 6.7 10*3/uL (ref 4.0–10.5)

## 2012-01-12 LAB — CARDIAC PANEL(CRET KIN+CKTOT+MB+TROPI)
CK, MB: 4.1 ng/mL — ABNORMAL HIGH (ref 0.3–4.0)
Relative Index: 1.8 (ref 0.0–2.5)
Troponin I: <0.3 ng/mL (ref <0.30)
Troponin I: <0.3 ng/mL (ref <0.30)

## 2012-01-12 LAB — BASIC METABOLIC PANEL
Chloride: 97 mEq/L (ref 96–112)
GFR calc Af Amer: 56 mL/min — ABNORMAL LOW (ref >90)
GFR calc non Af Amer: 48 mL/min — ABNORMAL LOW (ref >90)
Potassium: 4.1 mEq/L (ref 3.5–5.1)
Sodium: 136 mEq/L (ref 135–145)

## 2012-01-12 LAB — LIPID PANEL
Cholesterol: 96 mg/dL (ref 0–200)
Total CHOL/HDL Ratio: 2.1 RATIO
Triglycerides: 107 mg/dL (ref <150)
VLDL: 21 mg/dL (ref 0–40)

## 2012-01-12 LAB — MRSA PCR SCREENING: MRSA by PCR: NEGATIVE

## 2012-01-12 MED ORDER — HEPARIN SODIUM (PORCINE) 5000 UNIT/ML IJ SOLN
5000.0000 [IU] | Freq: Three times a day (TID) | INTRAMUSCULAR | Status: DC
  Administered 2012-01-12 – 2012-01-13 (×3): 5000 [IU] via SUBCUTANEOUS
  Filled 2012-01-12 (×7): qty 1

## 2012-01-12 NOTE — Progress Notes (Signed)
  Echocardiogram 2D Echocardiogram has been performed.  Cathie Beams Deneen 01/12/2012, 1:11 PM

## 2012-01-12 NOTE — Progress Notes (Signed)
Utilization Review Completed.Yailin Biederman T6/02/2012   

## 2012-01-12 NOTE — Progress Notes (Signed)
ANTICOAGULATION CONSULT NOTE - Follow Up Consult  Pharmacy Consult for heparin Indication: chest pain/ACS  Labs:  Basename 01/12/12 0700 01/12/12 0656 01/12/12 0014 01/11/12 1910 01/11/12 1253  HGB -- 11.8* -- -- 11.9*  HCT -- 37.1* -- -- 36.9*  PLT -- 172 -- -- 175  APTT -- -- -- -- 34  LABPROT -- -- -- -- 13.2  INR -- -- -- -- 0.98  HEPARINUNFRC 0.65 -- 0.55 -- --  CREATININE -- 1.44* -- -- 1.48*  CKTOTAL -- 223 259* 273* --  CKMB -- 4.1* 4.6* 5.0* --  TROPONINI -- <0.30 <0.30 <0.30 --    Assessment/Plan: CP. Noted plan to cycle enzymes and avoid cath if possible.  Anticoag: Heparin level 0.65 in goal. Trop negative x 3. CBC remains stable.  Infectious Disease: Afebrile. No abx.  Cardiovascular: h/o CAD -CABG 1998/STEMI with DES 1/13, HTN, HL, EF 25%. BP 122/32, HR 63. NSR with frequent PACs. Ruled OUT for MI. Meds: ASA 81mg , Lipitor, Coreg, Plavis, Lasix, losartan, K, spironolactone, NTG oint.  Endocrinology: h/o DM. CBGs 216, 171 this am. Meds: SSI, NPH  Gastrointestinal / Nutrition: po PPI  Neurology: Neurontin  Nephrology: h/o renal insufficiency with Scr 1.44 on Flomax  Pulmonary: Advair  Hematology / Oncology: CBC stable.   PTA Medication Issues: none  Goals: HL 0.3-.0.7  Plan: Continue IV heparin at 1150 units/hr. Next level in am. Possible d/c tomorrow.   Pasty Spillers PharmD BCPS 01/12/2012,9:21 AM

## 2012-01-12 NOTE — Progress Notes (Signed)
TELEMETRY: Reviewed telemetry pt in NSR with frequent PACs: Filed Vitals:   01/12/12 0400 01/12/12 0414 01/12/12 0500 01/12/12 0600  BP: 115/53  107/41 122/32  Pulse: 73  58 63  Temp:  97.9 F (36.6 C)    TempSrc:  Oral    Resp: 16  16 18   Height:      Weight:   100.6 kg (221 lb 12.5 oz)   SpO2: 98%  99% 99%    Intake/Output Summary (Last 24 hours) at 01/12/12 0742 Last data filed at 01/12/12 0600  Gross per 24 hour  Intake  946.5 ml  Output   2075 ml  Net -1128.5 ml    SUBJECTIVE Denies any chest pain since yesterday admit. Has to sleep in chair. No cough. Had some SOB last night improved with inhaler. Has chronic leg pain.  LABS: Basic Metabolic Panel:  Basename 01/11/12 1253  NA 134*  K 4.6  CL 95*  CO2 28  GLUCOSE 95  BUN 26*  CREATININE 1.48*  CALCIUM 9.3  MG --  PHOS --   Liver Function Tests:  Basename 01/11/12 1253  AST 20  ALT 16  ALKPHOS 70  BILITOT 0.3  PROT 7.1  ALBUMIN 3.7   No results found for this basename: LIPASE:2,AMYLASE:2 in the last 72 hours CBC:  Basename 01/12/12 0656 01/11/12 1253  WBC 6.7 7.2  NEUTROABS -- --  HGB 11.8* 11.9*  HCT 37.1* 36.9*  MCV 85.9 84.8  PLT 172 175   Cardiac Enzymes:  Basename 01/12/12 0014 01/11/12 1910  CKTOTAL 259* 273*  CKMB 4.6* 5.0*  CKMBINDEX -- --  TROPONINI <0.30 <0.30    Radiology/Studies:   Dg Chest Portable 1 View  01/11/2012  *RADIOLOGY REPORT*  Clinical Data: Chest pain.  PORTABLE CHEST - 1 VIEW  Comparison: 01/01/2012.  Findings: Normal sized heart.  Stable post CABG changes.  Clear lungs with normal vascularity.  Unremarkable bones.  IMPRESSION: No acute abnormality.  Original Report Authenticated By: Darrol Angel, M.D.   ECG: 01/12/12 sinus brady with PACs, RBBB, Old inferior and lateral infarcts.   PHYSICAL EXAM General: Well developed, obese, in no acute distress. Head: Normocephalic, atraumatic, sclera non-icteric, no xanthomas, nares are without discharge. Neck:  Negative for carotid bruits. JVD not elevated. Lungs: Clear bilaterally to auscultation without wheezes, rales, or rhonchi. Breathing is unlabored. Heart: RRR S1 S2 without murmurs, rubs, or gallops.  Abdomen: Soft, obese, non-tender, non-distended with normoactive bowel sounds. No hepatomegaly.  Extremities: No clubbing, cyanosis or edema.  Distal pedal pulses are 2+ and equal bilaterally. Neuro: Alert and oriented X 3. Moves all extremities spontaneously. Psych:  Responds to questions appropriately with a normal affect.  ASSESSMENT AND PLAN: 1. Unstable angina. Patient has ruled out for MI. CK and MB mildly elevated but ratio is normal. Troponins all negative. Ecg without acute change. Patient with complex cardiac history. S/p CABG 1998. S/p DES of SVG to diagonal 1/13. Known occlusion of SVGs to RCA and OM. Myoview study 4/13 showed a large inferolateral scar without ischemia. EF 25%. Given history of dye allergy and CKD will manage conservatively since he has ruled out. Transfer to telemetry. DC oxygen. Advance activity. Possible discharge tomorrow if no further chest pain.  2. HTN  3. DM  4. Hyperlipidemia  5. Obesity with symptoms of OSA- never formally evaluated. Plan sleep study as outpatient.  6. CKD stage 2.   7. Chronic systolic CHF due to ischemic cardiomyopathy. EF 45% by Echo 1/13. 25%  by nuclear study in April. Will repeat Echo. If EF less than 35% may need to consider for ICD.  Principal Problem:  *Unstable angina Active Problems:  HYPERCHOLESTEROLEMIA  HYPERTENSION  Chronic systolic heart failure  RENAL INSUFFICIENCY  DM2 (diabetes mellitus, type 2)    Signed, Harryette Shuart [place] MD,FACC 01/12/2012 7:42 AM

## 2012-01-12 NOTE — Progress Notes (Signed)
ANTICOAGULATION CONSULT NOTE - Follow Up Consult  Pharmacy Consult for heparin Indication: chest pain/ACS  Labs:  Basename 01/12/12 0014 01/11/12 1910 01/11/12 1253  HGB -- -- 11.9*  HCT -- -- 36.9*  PLT -- -- 175  APTT -- -- 34  LABPROT -- -- 13.2  INR -- -- 0.98  HEPARINUNFRC 0.55 -- --  CREATININE -- -- 1.48*  CKTOTAL -- 273* --  CKMB -- 5.0* --  TROPONINI -- <0.30 --    Assessment/Plan: 66yo male therapeutic on heparin with initial dosing for ACS.  Will continue heparin at current rate and confirm stable with am labs.  Colleen Can PharmD BCPS 01/12/2012,12:53 AM

## 2012-01-13 ENCOUNTER — Encounter (HOSPITAL_COMMUNITY): Payer: Self-pay | Admitting: Internal Medicine

## 2012-01-13 LAB — CBC
HCT: 37.9 % — ABNORMAL LOW (ref 39.0–52.0)
MCH: 27.4 pg (ref 26.0–34.0)
MCHC: 32.2 g/dL (ref 30.0–36.0)
MCV: 85.2 fL (ref 78.0–100.0)
Platelets: 166 10*3/uL (ref 150–400)
RDW: 14.1 % (ref 11.5–15.5)
WBC: 6.1 10*3/uL (ref 4.0–10.5)

## 2012-01-13 LAB — GLUCOSE, CAPILLARY

## 2012-01-13 MED ORDER — PANTOPRAZOLE SODIUM 40 MG PO TBEC: 40.0000 mg | DELAYED_RELEASE_TABLET | Freq: Every day | ORAL | Status: DC

## 2012-01-13 MED ORDER — ISOSORBIDE MONONITRATE ER 30 MG PO TB24: 30.0000 mg | ORAL_TABLET | Freq: Every day | ORAL | Status: DC

## 2012-01-13 NOTE — Discharge Summary (Signed)
Discharge Summary   Patient ID: Mark French MRN: [id number], DOB/AGE: 68-Jan-1947 68 y.o. Admit date: 01/11/2012 D/C date:     01/13/2012   Primary Discharge Diagnoses:  1. Chest pain, ruled out for MI - nitrate added 2. Possible sleep apnea- will need OP sleep study arranged 3. ICM/chronic systolic CHF  - poor windows on echo this admission - per Dr. Graciela Husbands, defer LV assessment to Dr. Excell Seltzer as outpatient to consider ICD implantation  Secondary Discharge Diagnoses:  1. CAD - s/p CABG 1998; admitted 08/2011 with an inferior STEMI. LHC 09/07/11: Severe Three-vessel CAD, LIMA-LAD patent, SVG-RCA chronically occluded, SVG-circumflex chronically occluded, SVG-diagonal 95%. PCI: Promus DES to the SVG-diagonal. Procedure c/b transient CHB req CPR and TTVP; myoview 4/13:large severe inferolateral infarct, no ischemia, EF 25%  2. CKD stage 22 3. DM 4. HTN 5. Proliferative diabetic retinopathy  6. HLD 7. Cervical radiculopathy 8. Iron deficiency anemia 9. Peripheral neuropathy 10. Peripheral vascular disease 11. ED 12. Hypogonadism male 53. GI bleed 2012 - Multiple Duodenal ulcer  14. Legally blind 15. 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 Charcot foot due to diabetes mellitus  18. Osteoarthritis  Hospital Course: 66 yr old male with h/o CAD, chronic systolic CHF, ICM, PVD, DM who presents with chest pain. At 07:30am on day of admission, he started experiencing 7/10 chest aching, somewhat similar in character but not as severe as his MI pain in January. Symptoms progressed over the next 4-5 hrs and he called EMS. EMS gave 4 ASA, 1 NTG SL at home and 2 enroute which completely relieved his pain. He has also had some SOB for the past 3 months for which he was started on inhaler. When he sleeps in bed, he gets short of breath and so has been sleeping in his recliner at night. Spouse states that, sometimes pt will cease at night for short periods of time. He also  reported daytime somnolence and his physicians this admissions agreed that he would benefit from OP sleep study. At time of cardiology eval in ER he was pain free on IV heparin. POC troponin was negative. EKG showed SR with 1 degree AVB, rbbb. The plan was to admit and rule out. Dr. [place] reviewed his records which included Myoview study 4/13 showing a large inferolateral scar without ischemia. Given history of dye allergy and CKD, Dr. [place] recommended conservative management since he since he has ruled out. 2D echo was obtained with unfortunately poor pictures. Today the patient is feeling better. Dr. Graciela Husbands would like to defer to Dr. Excell Seltzer reassessment of left ventricular function to consider ICD implantation. The patient has scheduled f/u 01/30/12. I will also be leaving a message on our office answering machine to see how our office can help facilitate sleep study. We transitioned NTG paste to oral Imdur today. Dr. Graciela Husbands has seen and examined the patient today and feels he is stable for discharge. Of note, the pt is also mildly anemic but this is consistent with prior labwork and is stable today.  Discharge Vitals: Blood pressure 137/61, pulse 60, temperature 97.9 F (36.6 C), temperature source Oral, resp. rate 20, height 5\' 6"  (1.676 m), weight 223 lb (101.152 kg), SpO2 100.00%.  Labs: Lab Results  Component Value Date   WBC 6.1 01/13/2012   HGB 12.2* 01/13/2012   HCT 37.9* 01/13/2012   MCV 85.2 01/13/2012   PLT 166 01/13/2012     Lab 01/12/12 0656 01/11/12 1253  NA  136 --  K 4.1 --  CL 97 --  CO2 26 --  BUN 28* --  CREATININE 1.44* --  CALCIUM 9.1 --  PROT -- 7.1  BILITOT -- 0.3  ALKPHOS -- 70  ALT -- 16  AST -- 20  GLUCOSE 182* --    Basename 01/12/12 0656 01/12/12 0014 01/11/12 1910  CKTOTAL 223 259* 273*  CKMB 4.1* 4.6* 5.0*  TROPONINI <0.30 <0.30 <0.30   Lab Results  Component Value Date   CHOL 96 01/12/2012   HDL 45 01/12/2012   LDLCALC 30 01/12/2012   TRIG 107 01/12/2012    Diagnostic Studies/Procedures   1. 2D Echo 01/12/12 Study Conclusions - Left ventricle: Poor acoustic window limits study.Overall LVEF appears normal. Inferior wall appears hypokinetic. But, cannot fully evaluate regional wall motion as  endocardium is not well seen. The cavity size was normal. Wall thickness was normal.  2. Dg Chest Portable 1 View 01/11/2012  *RADIOLOGY REPORT*  Clinical Data: Chest pain.  PORTABLE CHEST - 1 VIEW  Comparison: 01/01/2012.  Findings: Normal sized heart.  Stable post CABG changes.  Clear lungs with normal vascularity.  Unremarkable bones.  IMPRESSION: No acute abnormality.  Original Report Authenticated By: Darrol Angel, M.D.    Discharge Medications   Medication List  As of 01/13/2012 12:44 PM   STOP taking these medications         omeprazole 20 MG capsule         TAKE these medications         aspirin 81 MG tablet   Take 81 mg by mouth daily.      carvedilol 6.25 MG tablet   Commonly known as: COREG   Take 6.25 mg by mouth 2 (two) times daily with a meal.      clopidogrel 75 MG tablet   Commonly known as: PLAVIX   Take 75 mg by mouth daily with breakfast.      Fluticasone-Salmeterol 100-50 MCG/DOSE Aepb   Commonly known as: ADVAIR   Inhale 1 puff into the lungs 2 (two) times daily.      furosemide 80 MG tablet   Commonly known as: LASIX   Take 80 mg by mouth 2 (two) times daily. May take an extra 80 mg as needed      gabapentin 300 MG capsule   Commonly known as: NEURONTIN   Take 1 capsule (300 mg total) by mouth 2 (two) times daily.      HYDROcodone-acetaminophen 10-325 MG per tablet   Commonly known as: NORCO   Take 1-2 tablets by mouth every 4 (four) hours as needed. For pain      insulin NPH 100 UNIT/ML injection   Commonly known as: HUMULIN N,NOVOLIN N   Inject 50 Units into the skin daily before breakfast.      isosorbide mononitrate 30 MG 24 hr tablet   Commonly known as: IMDUR   Take 1 tablet (30 mg total) by mouth  daily.      losartan 50 MG tablet   Commonly known as: COZAAR   Take 50 mg by mouth daily.      nitroGLYCERIN 0.4 MG SL tablet   Commonly known as: NITROSTAT   Place 1 tablet (0.4 mg total) under the tongue every 5 (five) minutes as needed. For chest pain      NOVOLOG FLEXPEN 100 UNIT/ML injection   Generic drug: insulin aspart   Inject 20 Units into the skin 3 (three) times daily as needed. Sliding  scale      pantoprazole 40 MG tablet   Commonly known as: PROTONIX   Take 1 tablet (40 mg total) by mouth daily.      potassium chloride SA 20 MEQ tablet   Commonly known as: K-DUR,KLOR-CON   Take 20 mEq by mouth daily.      PROAIR HFA 108 (90 BASE) MCG/ACT inhaler   Generic drug: albuterol   INHALE 2 PUFFS INTO THE LUNGS EVERY 6 HOURS AS NEEDED FOR WHEEZING      rosuvastatin 20 MG tablet   Commonly known as: CRESTOR   Take 20 mg by mouth daily.      spironolactone 25 MG tablet   Commonly known as: ALDACTONE   Take 12.5 mg by mouth daily.      STOOL SOFTENER 100 MG capsule   Generic drug: Docusate Sodium   Take 100 mg by mouth daily as needed. constipation      Tamsulosin HCl 0.4 MG Caps   Commonly known as: FLOMAX   Take 0.4 mg by mouth daily.          omeprazole was changed to protonix given Plavix use  Disposition   The patient will be discharged in stable condition to home. Discharge Orders    Future Appointments: Provider: Department: Dept Phone: Center:   01/30/2012 3:00 PM Tonny Bollman, MD [facility] [phone] [facility]   01/30/2012 3:15 PM [facility] [phone] [facility]   03/15/2012 1:00 PM [facility] [phone] None   03/15/2012 2:00 PM Kalman Shan, MD [facility] [phone] None     Future Orders Please Complete By Expires   Diet - low sodium heart healthy      Increase activity slowly        Follow-up Information    Follow up with Tonny Bollman, MD. (01/30/12 at 3pm. We  left a message to see if Dr. Earmon Phoenix office can help schedule your sleep study - if you have not heard from the office by midweek, please call our office.)    Contact information:   [address] [place] [address] [phone]            Duration of Discharge Encounter: Greater than 30 minutes including physician and PA time.  Signed, Ronie Spies PA-C 01/13/2012, 12:44 PM

## 2012-01-13 NOTE — Progress Notes (Signed)
Patient Name: Mark French      SUBJECTIVE: 66 year old man remote CABG-1998 known LV dysfunction EF 45% January 2013 >>EF 25% April 2013   presented with chest pain 6/6; he subsequently ruled out for myocardial infarction Repeat echo was ordered to assess LV function>> unfortunately images were poor ejection fraction could not be assessed. Left atrial dimensions were noted to be significantly enlarged.  No further chest pain  He has significant daytime somnolence.    Past Medical History  Diagnosis Date  . CORONARY ARTERY DISEASE 03/11/2007    s/p CABG 1998;  admitted 08/2011 with an inferior STEMI.  LHC 09/07/11: Severe  Three-vessel CAD, LIMA-LAD patent, SVG-RCA chronically occluded, SVG-circumflex chronically occluded, SVG-diagonal 95%.  PCI: Promus DES to the SVG-diagonal.  Procedure c/b transient CHB req CPR and TTVP;  myoview 4/13:large severe inferolateral infarct, no ischemia, EF 25%    . DIABETES MELLITUS, TYPE I 03/11/2007  . HYPERTENSION 03/11/2007  . Proliferative diabetic retinopathy 03/09/2009  . HYPERCHOLESTEROLEMIA 09/28/2008  . CERVICAL RADICULOPATHY, LEFT 09/23/2008  . ANEMIA-IRON DEFICIENCY 09/23/2008  . PERIPHERAL NEUROPATHY 09/23/2008  . Chronic systolic heart failure 09/23/2008    Echocardiogram 09/07/11: Difficult study, inferior and posterior lateral wall hypokinesis, mild LVH, EF 45%, mild LAE.   [name] RENAL INSUFFICIENCY 03/15/2010  . PERIPHERAL VASCULAR INSUFFICIENCY,  LEGS, BILATERAL 08/17/2010    ABI's performed 04/19/11 R 0.72 L 0.71  . ED (erectile dysfunction)   . Hypogonadism male   . GI bleed 2012    Multiple Duodenal ulcer   . Obesity   . Legally blind     bilaterally  . Stroke 65    age 34 months; residual right sided weakness  . History of blood transfusion 2012  . Charcot foot due to diabetes mellitus     chronic pain  . Osteoarthritis     right hip and shoulder    PHYSICAL EXAM Filed Vitals:   01/12/12 1705 01/12/12 2100 01/13/12 0500 01/13/12 0746   BP:  107/68 138/63 132/72  Pulse:  56 57 64  Temp:  97.5 F (36.4 C) 97.9 F (36.6 C)   TempSrc:      Resp:  18 20   Height:      Weight:   223 lb (101.152 kg)   SpO2: 99% 99% 100%     Well developed and morbidly obese in no distress  HENT normal Neck supple  Clear Regular rate and rhythm, no murmurs or gallops Abd-soft with active BS No Clubbing cyanosis edema Skin-warm and dry A & Oriented  Grossly normal sensory and motor function  TELEMETRY: Reviewed telemetry pt in nsr/'    Intake/Output Summary (Last 24 hours) at 01/13/12 0827 Last data filed at 01/12/12 1319  Gross per 24 hour  Intake   21.5 ml  Output   1250 ml  Net -1228.5 ml    LABS: Basic Metabolic Panel:  Lab 01/12/12 4098 01/11/12 1253  NA 136 134*  K 4.1 4.6  CL 97 95*  CO2 26 28  GLUCOSE 182* 95  BUN 28* 26*  CREATININE 1.44* 1.48*  CALCIUM 9.1 9.3  MG -- --  PHOS -- --   Cardiac Enzymes:  Basename 01/12/12 0656 01/12/12 0014 01/11/12 1910  CKTOTAL 223 259* 273*  CKMB 4.1* 4.6* 5.0*  CKMBINDEX -- -- --  TROPONINI <0.30 <0.30 <0.30   CBC:  Lab 01/13/12 0540 01/12/12 0656 01/11/12 1253  WBC 6.1 6.7 7.2  NEUTROABS -- -- --  HGB 12.2* 11.8* 11.9*  HCT 37.9* 37.1* 36.9*  MCV 85.2 85.9 84.8  PLT 166 172 175   PROTIME:  Basename 01/11/12 1253  LABPROT 13.2  INR 0.98   Liver Function Tests:  Basename 01/11/12 1253  AST 20  ALT 16  ALKPHOS 70  BILITOT 0.3  PROT 7.1  ALBUMIN 3.7   No results found for this basename: LIPASE:2,AMYLASE:2 in the last 72 hours BNP: No components found with this basename: POCBNP:3 D-Dimer: No results found for this basename: DDIMER:2 in the last 72 hours Hemoglobin A1C: No results found for this basename: HGBA1C in the last 72 hours Fasting Lipid Panel:  Basename 01/12/12 0656  CHOL 96  HDL 45  LDLCALC 30  TRIG 107  CHOLHDL 2.1  LDLDIRECT --      ASSESSMENT AND PLAN:  Patient Active Hospital Problem List:  Ischemic  cardiomyopathy Chest pain Probable sleep apnea   HYPERTENSION (03/11/2007)  Chronic systolic heart failure (09/23/2008)  RENAL INSUFFICIENCY (03/15/2010)  DM2 (diabetes mellitus, type 2) (09/09/2011)   Plan discharge today Have scheduled followup with Tonny Bollman June 25 Would defer to him reassessment of left ventricular function to consider ICD implantation Need outpatient sleep study Continue current medications as an outpatient.      Signed, Sherryl Manges MD  01/13/2012

## 2012-01-16 ENCOUNTER — Telehealth: Payer: Self-pay | Admitting: Cardiovascular Disease

## 2012-01-16 ENCOUNTER — Emergency Department (HOSPITAL_COMMUNITY): Payer: Medicare Other

## 2012-01-16 ENCOUNTER — Emergency Department (HOSPITAL_COMMUNITY)
Admission: EM | Admit: 2012-01-16 | Discharge: 2012-01-16 | Disposition: A | Discharge status: Home / Self Care | Payer: Medicare Other | Attending: Emergency Medicine | Admitting: Emergency Medicine

## 2012-01-16 ENCOUNTER — Other Ambulatory Visit: Payer: Self-pay

## 2012-01-16 ENCOUNTER — Encounter (HOSPITAL_COMMUNITY): Payer: Self-pay | Admitting: *Deleted

## 2012-01-16 DIAGNOSIS — I10 Essential (primary) hypertension: Secondary | ICD-10-CM | POA: Insufficient documentation

## 2012-01-16 DIAGNOSIS — I2510 Atherosclerotic heart disease of native coronary artery without angina pectoris: Secondary | ICD-10-CM | POA: Insufficient documentation

## 2012-01-16 DIAGNOSIS — Z794 Long term (current) use of insulin: Secondary | ICD-10-CM | POA: Insufficient documentation

## 2012-01-16 DIAGNOSIS — R0602 Shortness of breath: Secondary | ICD-10-CM | POA: Insufficient documentation

## 2012-01-16 DIAGNOSIS — Z79899 Other long term (current) drug therapy: Secondary | ICD-10-CM | POA: Insufficient documentation

## 2012-01-16 DIAGNOSIS — E1049 Type 1 diabetes mellitus with other diabetic neurological complication: Secondary | ICD-10-CM | POA: Insufficient documentation

## 2012-01-16 DIAGNOSIS — Z951 Presence of aortocoronary bypass graft: Secondary | ICD-10-CM | POA: Insufficient documentation

## 2012-01-16 DIAGNOSIS — I252 Old myocardial infarction: Secondary | ICD-10-CM | POA: Insufficient documentation

## 2012-01-16 DIAGNOSIS — E1039 Type 1 diabetes mellitus with other diabetic ophthalmic complication: Secondary | ICD-10-CM | POA: Insufficient documentation

## 2012-01-16 DIAGNOSIS — E1142 Type 2 diabetes mellitus with diabetic polyneuropathy: Secondary | ICD-10-CM | POA: Insufficient documentation

## 2012-01-16 DIAGNOSIS — R0600 Dyspnea, unspecified: Secondary | ICD-10-CM

## 2012-01-16 DIAGNOSIS — E11319 Type 2 diabetes mellitus with unspecified diabetic retinopathy without macular edema: Secondary | ICD-10-CM | POA: Insufficient documentation

## 2012-01-16 LAB — CBC
Hemoglobin: 12.5 g/dL — ABNORMAL LOW (ref 13.0–17.0)
Platelets: 180 10*3/uL (ref 150–400)
RBC: 4.53 MIL/uL (ref 4.22–5.81)
WBC: 8 10*3/uL (ref 4.0–10.5)

## 2012-01-16 LAB — COMPREHENSIVE METABOLIC PANEL
ALT: 22 U/L (ref 0–53)
AST: 23 U/L (ref 0–37)
Alkaline Phosphatase: 72 U/L (ref 39–117)
CO2: 27 mEq/L (ref 19–32)
Calcium: 9.5 mg/dL (ref 8.4–10.5)
Chloride: 92 mEq/L — ABNORMAL LOW (ref 96–112)
GFR calc non Af Amer: 58 mL/min — ABNORMAL LOW (ref >90)
Glucose, Bld: 109 mg/dL — ABNORMAL HIGH (ref 70–99)
Potassium: 3.9 mEq/L (ref 3.5–5.1)
Sodium: 131 mEq/L — ABNORMAL LOW (ref 135–145)
Total Bilirubin: 0.3 mg/dL (ref 0.3–1.2)

## 2012-01-16 LAB — POCT I-STAT, CHEM 8
Calcium, Ion: 1.15 mmol/L (ref 1.12–1.32)
Chloride: 96 mEq/L (ref 96–112)
Glucose, Bld: 109 mg/dL — ABNORMAL HIGH (ref 70–99)
HCT: 40 % (ref 39.0–52.0)
TCO2: 27 mmol/L (ref 0–100)

## 2012-01-16 LAB — TROPONIN I: Troponin I: <0.3 ng/mL (ref <0.30)

## 2012-01-16 LAB — PRO B NATRIURETIC PEPTIDE: Pro B Natriuretic peptide (BNP): 519.1 pg/mL — ABNORMAL HIGH (ref 0–125)

## 2012-01-16 NOTE — ED Provider Notes (Signed)
History     CSN: [id number]  Arrival date & time 01/16/12  1532   First MD Initiated Contact with Patient 01/16/12 1706      Chief Complaint  Patient presents with  . Shortness of Breath    (Consider location/radiation/quality/duration/timing/severity/associated sxs/prior treatment) HPI  67 year old male past medical history of January 2013 with resultant intermittent dyspnea presents today with the same complaint dyspnea since this morning after eating. He has been to the emergency department several times for the same complaint since his event. His MI presented with chest pain,  Diaphoresis.  The only complaint today is mild to moderate dyspnea similar to recent episodes. . Denies chest pain arm pain jaw pain nausea vomiting diarrhea appear does any recent sick contacts or fevers. Calls his  discomfort mild/moderate. Nothing makes symptoms better or worse other than as mentioned above which include eating. Vital signs stable on arrival  Past Medical History  Diagnosis Date  . CORONARY ARTERY DISEASE 03/11/2007    s/p CABG 1998;  admitted 08/2011 with an inferior STEMI.  LHC 09/07/11: Severe  Three-vessel CAD, LIMA-LAD patent, SVG-RCA chronically occluded, SVG-circumflex chronically occluded, SVG-diagonal 95%.  PCI: Promus DES to the SVG-diagonal.  Procedure c/b transient CHB req CPR and TTVP;  myoview 4/13:large severe inferolateral infarct, no ischemia, EF 25%    . DIABETES MELLITUS, TYPE I 03/11/2007  . HYPERTENSION 03/11/2007  . Proliferative diabetic retinopathy 03/09/2009  . HYPERCHOLESTEROLEMIA 09/28/2008  . CERVICAL RADICULOPATHY, LEFT 09/23/2008  . ANEMIA-IRON DEFICIENCY 09/23/2008  . PERIPHERAL NEUROPATHY 09/23/2008  . Chronic systolic heart failure 09/23/2008    Echocardiogram 09/07/11: Difficult study, inferior and posterior lateral wall hypokinesis, mild LVH, EF 45%, mild LAE.   [name] RENAL INSUFFICIENCY 03/15/2010  . PERIPHERAL VASCULAR INSUFFICIENCY,  LEGS, BILATERAL 08/17/2010    ABI's  performed 04/19/11 R 0.72 L 0.71  . ED (erectile dysfunction)   . Hypogonadism male   . GI bleed 2012    Multiple Duodenal ulcer   . Obesity   . Legally blind     bilaterally  . Stroke 10    age 21 months; residual right sided weakness  . History of blood transfusion 2012  . Charcot foot due to diabetes mellitus     chronic pain  . Osteoarthritis     right hip and shoulder    Past Surgical History  Procedure Date  . Carpal tunnel release     left  . Sigmoidoscopy 03/11/2001  . Venous doppler 01/30/2004  . Eye surgery   . Fracture surgery   . Tonsillectomy and adenoidectomy     "as a kid"  . Cataract extraction w/ intraocular lens  implant, bilateral   . Coronary artery bypass graft 1998    CABG X5  . Coronary angioplasty with stent placement 08/2011    "1"  . Retinopathy surgery 2000's    "laser; both eyes"  . Wrist fusion 1976    right  . Anterior fusion cervical spine 2010    "C spine; Dr. Yetta Barre"  . Ankle fracture surgery 1976; ?date    LEFT:  fused; removed bulk of hardware  . Heel spur surgery ~ 2007    ? left    Family History  Problem Relation Age of Onset  . Heart disease Mother     Coronary Artery Disease  . Heart disease Father     Coronary Artery Disease  . Heart disease Paternal Grandmother     Coronary Artery Disease  . Colon cancer Neg Hx   .  Diabetes Other     Grandmother    History  Substance Use Topics  . Smoking status: Never Smoker   . Smokeless tobacco: Never Used  . Alcohol Use: No      Review of Systems Constitutional: Negative for fever and chills.  HENT: Negative for ear pain, sore throat and trouble swallowing.   Eyes: Negative for pain and visual disturbance.  Respiratory: Negative for cough and POS shortness of breath.   Cardiovascular: Negative for chest pain and leg swelling.  Gastrointestinal: Negative for nausea, vomiting, abdominal pain and diarrhea.  Genitourinary: Negative for dysuria, urgency and frequency.    Musculoskeletal: Negative for back pain and joint swelling.  Skin: Negative for rash and wound.  Neurological: Negative for dizziness, syncope, speech difficulty, weakness and numbness.    Allergies  Sildenafil and Contrast media  Home Medications   Current Outpatient Rx  Name Route Sig Dispense Refill  . ALBUTEROL SULFATE HFA 108 (90 BASE) MCG/ACT IN AERS Inhalation Inhale 2 puffs into the lungs every 4 (four) hours as needed. For shortness of breath    . ASPIRIN 81 MG PO TABS Oral Take 81 mg by mouth daily.    [name] CARVEDILOL 6.25 MG PO TABS Oral Take 6.25 mg by mouth 2 (two) times daily with a meal.    . CLOPIDOGREL BISULFATE 75 MG PO TABS Oral Take 75 mg by mouth daily with breakfast.    . DOCUSATE SODIUM 100 MG PO TABS Oral Take 100 mg by mouth daily as needed. constipation    . FLUTICASONE-SALMETEROL 100-50 MCG/DOSE IN AEPB Inhalation Inhale 1 puff into the lungs 2 (two) times daily. 1 each 11  . FUROSEMIDE 80 MG PO TABS Oral Take 80 mg by mouth 2 (two) times daily. May take an extra 80 mg as needed    . GABAPENTIN 300 MG PO CAPS Oral Take 1 capsule (300 mg total) by mouth 2 (two) times daily. 60 capsule 5  . HYDROCODONE-ACETAMINOPHEN 10-325 MG PO TABS Oral Take 1-2 tablets by mouth every 4 (four) hours as needed. For pain    . INSULIN ISOPHANE HUMAN 100 UNIT/ML [place] SUSP Subcutaneous Inject 50 Units into the skin daily before breakfast.     . ISOSORBIDE MONONITRATE ER 30 MG PO TB24 Oral Take 30 mg by mouth daily.    [name] LOSARTAN POTASSIUM 50 MG PO TABS Oral Take 50 mg by mouth daily.    [name] NITROGLYCERIN 0.4 MG SL SUBL Sublingual Place 1 tablet (0.4 mg total) under the tongue every 5 (five) minutes as needed. For chest pain 25 tablet 3  . NOVOLOG FLEXPEN 100 UNIT/ML [place] SOLN Subcutaneous Inject 20 Units into the skin 3 (three) times daily as needed. Sliding scale    . PANTOPRAZOLE SODIUM 40 MG PO TBEC Oral Take 40 mg by mouth daily.    [name] POTASSIUM CHLORIDE CRYS ER 20 MEQ PO TBCR Oral Take  20 mEq by mouth daily.    [name] ROSUVASTATIN CALCIUM 20 MG PO TABS Oral Take 20 mg by mouth daily.    [name] SPIRONOLACTONE 25 MG PO TABS Oral Take 12.5 mg by mouth daily.    [name] TAMSULOSIN HCL 0.4 MG PO CAPS Oral Take 0.4 mg by mouth daily.      BP 119/52  Pulse 74  Temp(Src) 98.1 F (36.7 C) (Oral)  Resp 21  SpO2 100%  Physical Exam Consitutional: Pt in no acute distress.   Head: Normocephalic and atraumatic.  Eyes: Extraocular motion intact, no scleral icterus  Neck: Supple without meningismus, mass, or overt JVD Respiratory: Effort normal and breath sounds normal. No respiratory distress. CV: Heart regular rate and rhythm, no obvious murmurs.  Pulses +2 and symmetric Abdomen: Soft, non-tender, non-distended, obese.  MSK: Extremities are atraumatic without deformity, both legs erythematous. R leg > L leg. Not warm. Skin: Warm, dry, intact Neuro: Alert and oriented, no motor deficit noted.  CNs intact.  PERRL.  Reflexes normal BLE, no clonus.  Hips, knee and ankle, arm and wrist strength maintained.  .nd RAM normal.  CNs 2-12 normal.  Psychiatric: Mood and affect are normal  EKG:  Rate: 71 Rythym Sinus  Interval 1st degree block Axis: normal RBBB No gross ST or T-wave abnormalities appreciated.  No meaningful change appreciated.    ED Course  Procedures (including critical care time)  Labs Reviewed  CBC - Abnormal; Notable for the following:    Hemoglobin 12.5 (*)    HCT 37.6 (*)    All other components within normal limits  POCT I-STAT, CHEM 8 - Abnormal; Notable for the following:    Sodium 133 (*)    BUN 31 (*)    Glucose, Bld 109 (*)    All other components within normal limits  POCT I-STAT TROPONIN I  PRO B NATRIURETIC PEPTIDE  TROPONIN I  COMPREHENSIVE METABOLIC PANEL  CBC  DIFFERENTIAL  COMPREHENSIVE METABOLIC PANEL   Dg Chest 2 View  01/16/2012  *RADIOLOGY REPORT*  Clinical Data: Shortness of breath.  CHEST - 2 VIEW  Comparison: Chest x-ray 01/11/2012.   Findings: Lung volumes are normal.  No consolidative airspace disease.  No pleural effusions.  No pneumothorax.  No pulmonary nodule or mass noted.  Pulmonary vasculature and the cardiomediastinal silhouette are within normal limits.  The patient is status post median sternotomy for CABG with a LIMA.  IMPRESSION: 1. No radiographic evidence of acute cardiopulmonary disease. 2.  Status post median sternotomy for CABG with a LIMA.  Original Report Authenticated By: Florencia Reasons, M.D.     No diagnosis found.    MDM   Patient with a subacute problem he is ever since his MI several months ago. His continuous several times for this. He has poor lateral renal function appear on a recent VQ scan which was low probability for pulmonary embolus. Per the patient and family, the patient has also undergone CT scans of the lower extremities recently with no acute findings. Given his recent VQ scan, the fact that his symptoms have not worsened, the fact that there is a likely etiology of his symptoms being some mild heart failure and/or recent heart attack, I think the risk far outweighs the benefit of completing a CT scan of the chest today.  Also, the patient is already on Plavix and aspirin. His vital signs and story are not consistent with pulmonary embolus enough to warrant aggressive search at this time beyond the rather thorough workup completed so far. Discussed this with the patient and his wife and they both agreed this approach was prudent.   Patient discharged home to follow up with his cardiologist and general doctors to better assess his chronic dyspnea. PT DC home stable.  Discussed with pt the clinical impression, treatment in the ED, and follow up plan.  We alslo discussed the indications for returning to the ED, which include shortness or breath, confusion, fever, new weakness or numbness, chest pain, or any other concerning symptom.  The pt understood the treatment and plan, is stable, and is  able to leave the ED.          Larrie Kass, MD 01/16/12 1910

## 2012-01-16 NOTE — Discharge Instructions (Signed)
You need to follow up with your doctors to look in to your waxing/waning symptoms of shortness of breath.  Follow up with your providers as dicussed in the ED today and as written above.  See your doctor immediately--or return to the ED--with any new or troubling symptoms including fevers, weakness, new chest pain, new shortness or breath, numbness, or any other concerning symptom.     Shortness of Breath  Shortness of breath (dyspnea) is the feeling of uneasy breathing. Shortness of breath does not always mean that there is a life-threatening illness. However, shortness of breath requires immediate medical care. CAUSES  Causes for shortness of breath include:  Not enough oxygen in the air (as with high altitudes or with a smoke-filled room).   Short-term (acute) lung disease, including:   Infections such as pneumonia.   Fluid in the lungs, such as heart failure.   A blood clot in the lungs (pulmonary embolism).   Lasting (chronic) lung diseases.   Heart disease (heart attack, angina, heart failure, and others).   Low red blood cells (anemia).   Poor physical fitness. This can cause shortness of breath when you exercise.   Chest or back injuries or stiffness.   Being overweight (obese).   Anxiety. This can make you feel like you are not getting enough air.  DIAGNOSIS  Serious medical problems can usually be found during your physical exam. Many tests may also be done to determine why you are having shortness of breath. Tests include:  Chest X-rays.   Lung function tests.   Blood tests.   Electrocardiography.   Exercise testing.   A cardiac echo.   Imaging scans.  Your caregiver may not be able to find a cause for your shortness of breath after your exam. In this case, it is important to have a follow-up exam with your caregiver as directed.  HOME CARE INSTRUCTIONS   Do not smoke. Smoking is a common cause of shortness of breath. Ask for help to stop smoking.    Avoid being around chemicals that may bother your breathing (paint fumes, dust).   Rest as needed. Slowly resume your usual activities.   If medicines were prescribed, take them as directed for the full length of time directed. This includes oxygen and any inhaled medicines.   Follow up with your caregiver as directed. Waiting to do so or failure to follow up could result in worsening of your condition and possible disability or death.   Be sure you understand what to do or who to call if your shortness of breath worsens.  SEEK MEDICAL CARE IF:   Your condition does not improve in the time expected.   You have a hard time doing your normal activities even with rest.   You have any side effects or problems with the medicines prescribed.   You develop any new symptoms.  SEEK IMMEDIATE MEDICAL CARE IF:   Your shortness of breath is getting worse.   You feel lightheaded, faint, or develop a cough not controlled with medicines.   You start coughing up blood.   You have pain with breathing.   You have chest pain or pain in your arms, shoulders, or abdomen.   You have a fever.   You are unable to walk up stairs or exercise the way you normally do.   Your symptoms are getting worse.  Document Released: 04/18/2001 Document Revised: 07/13/2011 Document Reviewed: 12/04/2007 [facility] Patient Information 2012 [place], [place].

## 2012-01-16 NOTE — ED Notes (Signed)
Pt took an extra 40 mg of Lasix at 1pm today. His normal dose is 80mg 

## 2012-01-16 NOTE — ED Notes (Signed)
Told to come to ED for eval of increased sob today. States he gained 5 lbs between 6am and 2pm today. Speaking in full clear sentences. Appears in nad. Sitting in wheelchair.

## 2012-01-16 NOTE — Telephone Encounter (Signed)
Spoke with pt, he is gasping on the phone. He reports he did found yesterday but he started having some problems this morning after eating breakfast. He reports his weight was 225 this am and now his weight is 230. He took an extra 40 mg of lasix over one hour ago but he does not feel like his SOB is getting any better. He does have more swelling in his feet and legs than what is usual for him. He feels he needs to go to the hosp. He is going to call his wife if she can not come home to take him he is going to call EMS. cardmaster at the hosp made aware.

## 2012-01-16 NOTE — Telephone Encounter (Signed)
New msg Pt's wife called and she said he has a lot of fluid and he has taken an extra 40 mg of furosemide today. He has some sob. Please call back

## 2012-01-19 NOTE — ED Provider Notes (Signed)
I reviewed the resident's note and I agree with the findings and plan.    Nelia Shi, MD 01/19/12 1125

## 2012-01-22 ENCOUNTER — Emergency Department (HOSPITAL_COMMUNITY): Payer: Medicare Other

## 2012-01-22 ENCOUNTER — Emergency Department (HOSPITAL_COMMUNITY)
Admission: EM | Admit: 2012-01-22 | Discharge: 2012-01-22 | Disposition: A | Discharge status: Home / Self Care | Payer: Medicare Other | Attending: Emergency Medicine | Admitting: Emergency Medicine

## 2012-01-22 ENCOUNTER — Telehealth: Payer: Self-pay | Admitting: Endocrinology

## 2012-01-22 ENCOUNTER — Encounter (HOSPITAL_COMMUNITY): Payer: Self-pay

## 2012-01-22 DIAGNOSIS — I10 Essential (primary) hypertension: Secondary | ICD-10-CM | POA: Insufficient documentation

## 2012-01-22 DIAGNOSIS — E1039 Type 1 diabetes mellitus with other diabetic ophthalmic complication: Secondary | ICD-10-CM | POA: Insufficient documentation

## 2012-01-22 DIAGNOSIS — I4510 Unspecified right bundle-branch block: Secondary | ICD-10-CM | POA: Insufficient documentation

## 2012-01-22 DIAGNOSIS — Z951 Presence of aortocoronary bypass graft: Secondary | ICD-10-CM | POA: Insufficient documentation

## 2012-01-22 DIAGNOSIS — E11359 Type 2 diabetes mellitus with proliferative diabetic retinopathy without macular edema: Secondary | ICD-10-CM | POA: Insufficient documentation

## 2012-01-22 DIAGNOSIS — I5022 Chronic systolic (congestive) heart failure: Secondary | ICD-10-CM | POA: Insufficient documentation

## 2012-01-22 DIAGNOSIS — R6884 Jaw pain: Secondary | ICD-10-CM | POA: Insufficient documentation

## 2012-01-22 DIAGNOSIS — I70209 Unspecified atherosclerosis of native arteries of extremities, unspecified extremity: Secondary | ICD-10-CM | POA: Insufficient documentation

## 2012-01-22 DIAGNOSIS — Z794 Long term (current) use of insulin: Secondary | ICD-10-CM | POA: Insufficient documentation

## 2012-01-22 DIAGNOSIS — N289 Disorder of kidney and ureter, unspecified: Secondary | ICD-10-CM | POA: Insufficient documentation

## 2012-01-22 DIAGNOSIS — I2510 Atherosclerotic heart disease of native coronary artery without angina pectoris: Secondary | ICD-10-CM | POA: Insufficient documentation

## 2012-01-22 DIAGNOSIS — M542 Cervicalgia: Secondary | ICD-10-CM

## 2012-01-22 DIAGNOSIS — Z79899 Other long term (current) drug therapy: Secondary | ICD-10-CM | POA: Insufficient documentation

## 2012-01-22 LAB — BASIC METABOLIC PANEL
BUN: 32 mg/dL — ABNORMAL HIGH (ref 6–23)
Chloride: 96 mEq/L (ref 96–112)
GFR calc Af Amer: 59 mL/min — ABNORMAL LOW (ref >90)
Potassium: 4.6 mEq/L (ref 3.5–5.1)

## 2012-01-22 LAB — DIFFERENTIAL
Basophils Absolute: 0 10*3/uL (ref 0.0–0.1)
Lymphocytes Relative: 14 % (ref 12–46)
Monocytes Absolute: 1.1 10*3/uL — ABNORMAL HIGH (ref 0.1–1.0)
Neutro Abs: 6.1 10*3/uL (ref 1.7–7.7)

## 2012-01-22 LAB — CBC
HCT: 35.5 % — ABNORMAL LOW (ref 39.0–52.0)
Hemoglobin: 11.6 g/dL — ABNORMAL LOW (ref 13.0–17.0)
RBC: 4.22 MIL/uL (ref 4.22–5.81)
RDW: 14.5 % (ref 11.5–15.5)
WBC: 8.7 10*3/uL (ref 4.0–10.5)

## 2012-01-22 MED ORDER — HYDROCODONE-ACETAMINOPHEN 5-325 MG PO TABS
2.0000 | ORAL_TABLET | Freq: Once | ORAL | Status: AC
  Administered 2012-01-22: 2 via ORAL
  Filled 2012-01-22: qty 2

## 2012-01-22 MED ORDER — HYDROCODONE-ACETAMINOPHEN 5-500 MG PO TABS: 1.0000 | ORAL_TABLET | Freq: Four times a day (QID) | ORAL | Status: DC | PRN

## 2012-01-22 NOTE — ED Notes (Signed)
EKG and old EKG signed by Dr.DeLo, copy placed in chart.

## 2012-01-22 NOTE — ED Notes (Signed)
Reports h/a last night but gone now. This am awoke with c/o left side neck & jaw pain. Denies CP, diaphoresis Denies injury. Pain worse with mvmt

## 2012-01-22 NOTE — Telephone Encounter (Signed)
Caller: Marilyn/Spouse; PCP: [name]; CB#: [phone];  Call regarding Husband Has Had Trouble With Blood Sugar Fluctuation- on 01/21/12 @ 0830 = 500 mmHg and took 40 u R and 24 u of Novalog @0830 . He exercised and it went down to 49@ 1030. He ate orange and banana and went up to 55 and then ate toast and slowly came up. Blood Sugar = 100 today-01/22/12.  BP = 88/42 and 94/48 @ 0730 and @137 /69 @ 0900 -01/22/12. Has Spot On L Side of His Neck That Is Very Painfull- hurts to swallow, tender to touch,  no worsening numbnsss. Has neuropathy. Jaw hurt on and off yesterday.  He is short of breath -used Adviar Inhaler at 0700  and will use other inhaler now. Triage and Care advice per Neck Pain protocol and spoke with Morrie Sheldon RN in office. Advised to go to ER since no appnt avialable in the office.

## 2012-01-22 NOTE — ED Notes (Signed)
Pt presented to the Er with complaint of headache onset last night and now pain to the lt jaw. Pt is complaining of shortness of breath but denies any chest pain.

## 2012-01-22 NOTE — Discharge Instructions (Signed)
Pain of Unknown Etiology (Pain Without a Known Cause) You have come to your caregiver because of pain. Pain can occur in any part of the body. Often there is not a definite cause. If your laboratory (blood or urine) work was normal and x-rays or other studies were normal, your caregiver may treat you without knowing the cause of the pain. An example of this is the headache. Most headaches are diagnosed by taking a history. This means your caregiver asks you questions about your headaches. Your caregiver determines a treatment based on your answers. Usually testing done for headaches is normal. Often testing is not done unless there is no response to medications. Regardless of where your pain is located today, you can be given medications to make you comfortable. If no physical cause of pain can be found, most cases of pain will gradually leave as suddenly as they came.  If you have a painful condition and no reason can be found for the pain, It is importantthat you follow up with your caregiver. If the pain becomes worse or does not go away, it may be necessary to repeat tests and look further for a possible cause.  Only take over-the-counter or prescription medicines for pain, discomfort, or fever as directed by your caregiver.   For the protection of your privacy, test results can not be given over the phone. Make sure you receive the results of your test. Ask as to how these results are to be obtained if you have not been informed. It is your responsibility to obtain your test results.   You may continue all activities unless the activities cause more pain. When the pain lessens, it is important to gradually resume normal activities. Resume activities by beginning slowly and gradually increasing the intensity and duration of the activities or exercise. During periods of severe pain, bed-rest may be helpful. Lay or sit in any position that is comfortable.   Ice used for acute (sudden) conditions may be  effective. Use a large plastic bag filled with ice and wrapped in a towel. This may provide pain relief.   See your caregiver for continued problems. They can help or refer you for exercises or physical therapy if necessary.  If you were given medications for your condition, do not drive, operate machinery or power tools, or sign legal documents for 24 hours. Do not drink alcohol, take sleeping pills, or take other medications that may interfere with treatment. See your caregiver immediately if you have pain that is becoming worse and not relieved by medications. Document Released: 04/18/2001 Document Revised: 07/13/2011 Document Reviewed: 07/24/2005 [employer] Patient Information 2012 [employer]. 

## 2012-01-22 NOTE — ED Notes (Signed)
Patient transported to X-ray 
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
## 2012-01-25 ENCOUNTER — Other Ambulatory Visit: Payer: Self-pay | Admitting: Cardiovascular Disease

## 2012-01-27 ENCOUNTER — Encounter: Payer: Self-pay | Admitting: Family Medicine

## 2012-01-27 ENCOUNTER — Ambulatory Visit (INDEPENDENT_AMBULATORY_CARE_PROVIDER_SITE_OTHER): Payer: Medicare Other | Admitting: Family Medicine

## 2012-01-27 VITALS — BP 100/60 | Temp 97.5°F | Wt 226.0 lb

## 2012-01-27 DIAGNOSIS — G609 Hereditary and idiopathic neuropathy, unspecified: Secondary | ICD-10-CM

## 2012-01-27 MED ORDER — GABAPENTIN 300 MG PO CAPS: 300.0000 mg | ORAL_CAPSULE | Freq: Three times a day (TID) | ORAL | Status: DC

## 2012-01-27 NOTE — Patient Instructions (Signed)
Increase gabapentin for nerve pain to three times a day. Prescription for norco 10/325 provided until you see [name]. See Dr Everardo All for follow up. I hope you start feeling better. Good to see you today, call us with questions.

## 2012-01-27 NOTE — Progress Notes (Signed)
Subjective:    Patient ID: Mark French, male    DOB: 04/22/1946, 66 y.o.   MRN: [id number]  HPI CC: pain  66 yo patient of Dr. George Hugh with h/o CAD, T1DM, HTN, known left cervical radiculopathy, peripheral neuropathy, chronic sCHF with echo 08/2011 EF 45%, PAD, CRI, h/o duodenal ulcers, CVA as infant with residual R weakness, and OA and charcot foot,  who presents with worsening pain.  Seen at ER several times in last month (6/6, 6/11, 6/17).  Latest dx was "pain without known cause" and prescribed #20 norco 5/500 on 01/22/2012 but not touching pain.  Prior has been on norco 10/325.  States this was prescribed by [name] in past.  States last prescription was 09/2011.  States mostly neuropathy pain (burning in feet/legs) but also having intermittent right shoulder pain and neck pain from known arthritis.  Reviewing med history - has had norco 10/325 prescribed in past by PCP, #90-150 in past.  Latest script was norco 10/325 #30 by cardiology.  Only on gabapentin 300mg  bid.  Sees Dr. Excell Seltzer on Tuesday, [name] 02/06/2012.  No fevers/chills, chest pain/tightness, SOB (in last few weeks), HA or dizziness.  Sugars have been running "good", states 110-140 random.  Compliant with sugars and with insulin.  Lab Results  Component Value Date   CREATININE 1.39* 01/22/2012    Lab Results  Component Value Date   HGBA1C 6.7* 11/19/2011    Past Medical History  Diagnosis Date  . CORONARY ARTERY DISEASE 03/11/2007    s/p CABG 1998;  admitted 08/2011 with an inferior STEMI.  LHC 09/07/11: Severe  Three-vessel CAD, LIMA-LAD patent, SVG-RCA chronically occluded, SVG-circumflex chronically occluded, SVG-diagonal 95%.  PCI: Promus DES to the SVG-diagonal.  Procedure c/b transient CHB req CPR and TTVP;  myoview 4/13:large severe inferolateral infarct, no ischemia, EF 25%    . DIABETES MELLITUS, TYPE I 03/11/2007  . HYPERTENSION 03/11/2007  . Proliferative diabetic retinopathy 03/09/2009  .  HYPERCHOLESTEROLEMIA 09/28/2008  . CERVICAL RADICULOPATHY, LEFT 09/23/2008  . ANEMIA-IRON DEFICIENCY 09/23/2008  . PERIPHERAL NEUROPATHY 09/23/2008  . Chronic systolic heart failure 09/23/2008    Echocardiogram 09/07/11: Difficult study, inferior and posterior lateral wall hypokinesis, mild LVH, EF 45%, mild LAE.   [name] RENAL INSUFFICIENCY 03/15/2010  . PERIPHERAL VASCULAR INSUFFICIENCY,  LEGS, BILATERAL 08/17/2010    ABI's performed 04/19/11 R 0.72 L 0.71  . ED (erectile dysfunction)   . Hypogonadism male   . GI bleed 2012    Multiple Duodenal ulcer   . Obesity   . Legally blind     bilaterally  . Stroke 66    age 68 months; residual right sided weakness  . History of blood transfusion 2012  . Charcot foot due to diabetes mellitus     chronic pain  . Osteoarthritis     right hip and shoulder    Review of Systems Per HPI    Objective:   Physical Exam  Nursing note and vitals reviewed. Constitutional: He appears well-developed and well-nourished. No distress.       blind  HENT:  Head: Normocephalic and atraumatic.  Mouth/Throat: Oropharynx is clear and moist. No oropharyngeal exudate.  Eyes: Conjunctivae and EOM are normal. Pupils are equal, round, and reactive to light. No scleral icterus.  Neck: Normal range of motion. Neck supple.  Cardiovascular: Normal rate, regular rhythm, normal heart sounds and intact distal pulses.   No murmur heard. Pulmonary/Chest: Effort normal and breath sounds normal. No respiratory distress. He has no wheezes.  He has no rales.  Neurological:       Residual R sided weakness       Assessment & Plan:

## 2012-01-28 NOTE — Assessment & Plan Note (Signed)
Will start by increasing gabapentin.  Cr should tolerate this.  As endorses daylong neuropathy, will dose tid. Will also provide pt with norco script for 10/325mg  until gets in to see PCP (has appt in 10 days). Other issues today seem stable.

## 2012-01-30 ENCOUNTER — Other Ambulatory Visit (INDEPENDENT_AMBULATORY_CARE_PROVIDER_SITE_OTHER): Payer: Medicare Other

## 2012-01-30 ENCOUNTER — Ambulatory Visit (INDEPENDENT_AMBULATORY_CARE_PROVIDER_SITE_OTHER): Payer: Medicare Other | Admitting: Cardiovascular Disease

## 2012-01-30 ENCOUNTER — Encounter: Payer: Self-pay | Admitting: Cardiovascular Disease

## 2012-01-30 VITALS — BP 120/58 | HR 63 | Ht 68.0 in | Wt 227.0 lb

## 2012-01-30 DIAGNOSIS — R0602 Shortness of breath: Secondary | ICD-10-CM

## 2012-01-30 DIAGNOSIS — I2510 Atherosclerotic heart disease of native coronary artery without angina pectoris: Secondary | ICD-10-CM

## 2012-01-30 DIAGNOSIS — R0989 Other specified symptoms and signs involving the circulatory and respiratory systems: Secondary | ICD-10-CM

## 2012-01-30 DIAGNOSIS — I5022 Chronic systolic (congestive) heart failure: Secondary | ICD-10-CM

## 2012-01-30 NOTE — Patient Instructions (Addendum)
Your physician recommends that you have lab work today: BMP and BNP  Your physician recommends that you schedule a follow-up appointment in: 3 MONTHS with Dr Excell Seltzer  Your physician recommends that you continue on your current medications as directed. Please refer to the Current Medication list given to you today.

## 2012-01-31 LAB — BASIC METABOLIC PANEL
BUN: 32 mg/dL — ABNORMAL HIGH (ref 6–23)
CO2: 29 mEq/L (ref 19–32)
Calcium: 8.7 mg/dL (ref 8.4–10.5)
Chloride: 99 mEq/L (ref 96–112)
GFR: 48.29 mL/min — ABNORMAL LOW (ref >60.00)
Sodium: 135 mEq/L (ref 135–145)

## 2012-01-31 LAB — BRAIN NATRIURETIC PEPTIDE: Pro B Natriuretic peptide (BNP): 207 pg/mL — ABNORMAL HIGH (ref 0.0–100.0)

## 2012-02-02 ENCOUNTER — Encounter: Payer: Self-pay | Admitting: Cardiovascular Disease

## 2012-02-02 NOTE — Progress Notes (Signed)
HPI:  66 year old gentleman presenting for followup evaluation. The patient has a complex cardiac history related to coronary artery disease and recent myocardial infarction. He underwent multivessel CABG in 1998. He has multiple comorbidities include peripheral arterial disease, type 2 diabetes, obesity, and he is essentially wheelchair-bound.  The patient has had multiple emergency department evaluations over the last 6 weeks. He's been hospitalized for heart failure. It is difficult to assess his left ventricular ejection fraction by echo because of poor acoustic windows. His ejection fraction by nuclear scan was only 25%.  He continues to have episodic shortness of breath. He gets some relief from using nebulizer or inhaler. He has developed chronic orthopnea. He also has chronic leg edema but his leg swelling is at baseline. He's had no chest pain or pressure recently.  Outpatient Encounter Prescriptions as of 01/30/2012  Medication Sig Dispense Refill  . albuterol (PROVENTIL HFA;VENTOLIN HFA) 108 (90 BASE) MCG/ACT inhaler Inhale 2 puffs into the lungs every 4 (four) hours as needed. For shortness of breath      . aspirin 81 MG tablet Take 81 mg by mouth daily.      . carvedilol (COREG) 6.25 MG tablet Take 6.25 mg by mouth 2 (two) times daily with a meal.      . clopidogrel (PLAVIX) 75 MG tablet Take 75 mg by mouth daily with breakfast.      . Docusate Sodium (STOOL SOFTENER) 100 MG capsule Take 100 mg by mouth daily as needed. constipation      . Fluticasone-Salmeterol (ADVAIR DISKUS) 100-50 MCG/DOSE AEPB Inhale 1 puff into the lungs 2 (two) times daily.  1 each  11  . furosemide (LASIX) 80 MG tablet Take 80-160 mg by mouth 2 (two) times daily. May take an extra 80 mg as needed      . gabapentin (NEURONTIN) 300 MG capsule Take 1 capsule (300 mg total) by mouth 3 (three) times daily.      [name] HYDROcodone-acetaminophen (NORCO) 10-325 MG per tablet Take 1-2 tablets by mouth every 4 (four) hours  as needed. For pain  30 tablet  0  . insulin NPH (HUMULIN N,NOVOLIN N) 100 UNIT/ML injection Inject 50 Units into the skin daily before breakfast.       . isosorbide mononitrate (IMDUR) 30 MG 24 hr tablet Take 30 mg by mouth daily.      [name] losartan (COZAAR) 50 MG tablet Take 50 mg by mouth daily.      . nitroGLYCERIN (NITROSTAT) 0.4 MG SL tablet Place 1 tablet (0.4 mg total) under the tongue every 5 (five) minutes as needed. For chest pain  25 tablet  3  . NOVOLOG FLEXPEN 100 UNIT/ML injection Inject 20 Units into the skin 2 (two) times daily as needed. Sliding scale      . pantoprazole (PROTONIX) 40 MG tablet Take 40 mg by mouth daily.      . potassium chloride SA (K-DUR,KLOR-CON) 20 MEQ tablet Take 20 mEq by mouth daily.      . rosuvastatin (CRESTOR) 20 MG tablet Take 20 mg by mouth daily.      [name] spironolactone (ALDACTONE) 25 MG tablet Take 12.5 mg by mouth daily.      . Tamsulosin HCl (FLOMAX) 0.4 MG CAPS Take 0.4 mg by mouth daily.      [name] DISCONTD: aspirin 325 MG tablet Take 81 mg by mouth daily.        Allergies  Allergen Reactions  . Sildenafil Nausea Only and Other (See Comments)    "  took it once; heart went to pieces; never took it again"  . Contrast Media (Iodinated Diagnostic Agents) Nausea Only    Past Medical History  Diagnosis Date  . CORONARY ARTERY DISEASE 03/11/2007    s/p CABG 1998;  admitted 08/2011 with an inferior STEMI.  LHC 09/07/11: Severe  Three-vessel CAD, LIMA-LAD patent, SVG-RCA chronically occluded, SVG-circumflex chronically occluded, SVG-diagonal 95%.  PCI: Promus DES to the SVG-diagonal.  Procedure c/b transient CHB req CPR and TTVP;  myoview 4/13:large severe inferolateral infarct, no ischemia, EF 25%    . DIABETES MELLITUS, TYPE I 03/11/2007  . HYPERTENSION 03/11/2007  . Proliferative diabetic retinopathy 03/09/2009  . HYPERCHOLESTEROLEMIA 09/28/2008  . CERVICAL RADICULOPATHY, LEFT 09/23/2008  . ANEMIA-IRON DEFICIENCY 09/23/2008  . PERIPHERAL NEUROPATHY 09/23/2008  .  Chronic systolic heart failure 09/23/2008    Echocardiogram 09/07/11: Difficult study, inferior and posterior lateral wall hypokinesis, mild LVH, EF 45%, mild LAE.   [name] RENAL INSUFFICIENCY 03/15/2010  . PERIPHERAL VASCULAR INSUFFICIENCY,  LEGS, BILATERAL 08/17/2010    ABI's performed 04/19/11 R 0.72 L 0.71  . ED (erectile dysfunction)   . Hypogonadism male   . GI bleed 2012    Multiple Duodenal ulcer   . Obesity   . Legally blind     bilaterally  . Stroke 26    age 77 months; residual right sided weakness  . History of blood transfusion 2012  . Charcot foot due to diabetes mellitus     chronic pain  . Osteoarthritis     right hip and shoulder    ROS: Negative except as per HPI  BP 120/58  Pulse 63  Ht 5\' 8"  (1.727 m)  Wt 102.967 kg (227 lb)  BMI 34.52 kg/m2  SpO2 97%  PHYSICAL EXAM: Pt is alert and oriented, chronically ill-appearing male in NAD HEENT: normal Neck: JVP - normal, carotids 2+= with bilateral bruits Lungs: CTA bilaterally CV: RRR without murmur or gallop, distant heart sounds Abd: soft, NT, obese Ext: Chronic stasis changes, tight brawny 1+ bilateral pretibial edema and calf edema Skin: Stasis changes  ASSESSMENT AND PLAN: 1. Chronic systolic heart failure. He has class 3 symptoms at present. I don't think there is much more we can do to optimize his cardiopulmonary situation. He is on a good program with furosemide, losartan, isosorbide, Aldactone, and carvedilol.  2. Coronary artery disease status post myocardial infarction. He will remain on aspirin and Plavix. Medical management as above.  For followup, he'll return in 3 months or sooner if problems arise. We'll check a metabolic panel and BNP today.  Tonny Bollman 02/02/2012 11:02 PM

## 2012-02-06 ENCOUNTER — Ambulatory Visit (INDEPENDENT_AMBULATORY_CARE_PROVIDER_SITE_OTHER): Payer: Medicare Other | Admitting: Endocrinology

## 2012-02-06 ENCOUNTER — Encounter: Payer: Self-pay | Admitting: Endocrinology

## 2012-02-06 VITALS — BP 132/68 | HR 73 | Temp 97.0°F | Ht 66.0 in | Wt 224.0 lb

## 2012-02-06 DIAGNOSIS — E1049 Type 1 diabetes mellitus with other diabetic neurological complication: Secondary | ICD-10-CM

## 2012-02-06 DIAGNOSIS — E1142 Type 2 diabetes mellitus with diabetic polyneuropathy: Secondary | ICD-10-CM

## 2012-02-06 DIAGNOSIS — E1149 Type 2 diabetes mellitus with other diabetic neurological complication: Secondary | ICD-10-CM

## 2012-02-06 DIAGNOSIS — E119 Type 2 diabetes mellitus without complications: Secondary | ICD-10-CM

## 2012-02-06 MED ORDER — HYDROCODONE-ACETAMINOPHEN 10-325 MG PO TABS: 1.0000 | ORAL_TABLET | ORAL | Status: DC | PRN

## 2012-02-06 NOTE — Patient Instructions (Addendum)
Here is a prescription for a larger quantity of pain medication. Increase insulin to 55 units each morning.  Try not to take the "as-needed" insulin.   Please come back for a regular physical appointment in 3 months.
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
## 2012-02-08 DIAGNOSIS — E119 Type 2 diabetes mellitus without complications: Secondary | ICD-10-CM | POA: Insufficient documentation

## 2012-02-27 ENCOUNTER — Inpatient Hospital Stay (HOSPITAL_COMMUNITY)
Admission: AD | Admit: 2012-02-27 | Discharge: 2012-03-01 | DRG: 501 | Disposition: A | Discharge status: Home Health | Payer: Medicare Other | Source: Ambulatory Visit | Attending: Orthopedic Surgery | Admitting: Orthopedic Surgery

## 2012-02-27 ENCOUNTER — Encounter (HOSPITAL_COMMUNITY): Payer: Self-pay | Admitting: Certified Registered"

## 2012-02-27 ENCOUNTER — Inpatient Hospital Stay (HOSPITAL_COMMUNITY): Payer: Medicare Other | Admitting: Certified Registered"

## 2012-02-27 ENCOUNTER — Ambulatory Visit: Admit: 2012-02-27 | Payer: Self-pay | Admitting: Orthopedic Surgery

## 2012-02-27 ENCOUNTER — Encounter (HOSPITAL_COMMUNITY)
Admission: AD | Disposition: A | Discharge status: Home Health | Payer: Self-pay | Source: Ambulatory Visit | Attending: Orthopedic Surgery

## 2012-02-27 ENCOUNTER — Other Ambulatory Visit (INDEPENDENT_AMBULATORY_CARE_PROVIDER_SITE_OTHER): Payer: Self-pay | Admitting: Physician Assistant

## 2012-02-27 DIAGNOSIS — F411 Generalized anxiety disorder: Secondary | ICD-10-CM | POA: Diagnosis present

## 2012-02-27 DIAGNOSIS — M7040 Prepatellar bursitis, unspecified knee: Principal | ICD-10-CM | POA: Diagnosis present

## 2012-02-27 DIAGNOSIS — E119 Type 2 diabetes mellitus without complications: Secondary | ICD-10-CM | POA: Diagnosis present

## 2012-02-27 DIAGNOSIS — K5909 Other constipation: Secondary | ICD-10-CM | POA: Diagnosis not present

## 2012-02-27 DIAGNOSIS — L0390 Cellulitis, unspecified: Secondary | ICD-10-CM

## 2012-02-27 DIAGNOSIS — Z79899 Other long term (current) drug therapy: Secondary | ICD-10-CM

## 2012-02-27 DIAGNOSIS — M71161 Other infective bursitis, right knee: Secondary | ICD-10-CM | POA: Diagnosis present

## 2012-02-27 DIAGNOSIS — Z981 Arthrodesis status: Secondary | ICD-10-CM

## 2012-02-27 DIAGNOSIS — Z7982 Long term (current) use of aspirin: Secondary | ICD-10-CM

## 2012-02-27 DIAGNOSIS — Z794 Long term (current) use of insulin: Secondary | ICD-10-CM

## 2012-02-27 DIAGNOSIS — I5022 Chronic systolic (congestive) heart failure: Secondary | ICD-10-CM

## 2012-02-27 DIAGNOSIS — G609 Hereditary and idiopathic neuropathy, unspecified: Secondary | ICD-10-CM | POA: Diagnosis present

## 2012-02-27 DIAGNOSIS — H548 Legal blindness, as defined in USA: Secondary | ICD-10-CM | POA: Diagnosis present

## 2012-02-27 DIAGNOSIS — F329 Major depressive disorder, single episode, unspecified: Secondary | ICD-10-CM | POA: Diagnosis present

## 2012-02-27 DIAGNOSIS — R609 Edema, unspecified: Secondary | ICD-10-CM

## 2012-02-27 DIAGNOSIS — R0602 Shortness of breath: Secondary | ICD-10-CM

## 2012-02-27 DIAGNOSIS — F3289 Other specified depressive episodes: Secondary | ICD-10-CM | POA: Diagnosis present

## 2012-02-27 DIAGNOSIS — I5023 Acute on chronic systolic (congestive) heart failure: Secondary | ICD-10-CM

## 2012-02-27 DIAGNOSIS — I10 Essential (primary) hypertension: Secondary | ICD-10-CM | POA: Diagnosis present

## 2012-02-27 DIAGNOSIS — R079 Chest pain, unspecified: Secondary | ICD-10-CM

## 2012-02-27 DIAGNOSIS — I739 Peripheral vascular disease, unspecified: Secondary | ICD-10-CM

## 2012-02-27 DIAGNOSIS — I509 Heart failure, unspecified: Secondary | ICD-10-CM | POA: Diagnosis present

## 2012-02-27 DIAGNOSIS — I2510 Atherosclerotic heart disease of native coronary artery without angina pectoris: Secondary | ICD-10-CM

## 2012-02-27 DIAGNOSIS — A4901 Methicillin susceptible Staphylococcus aureus infection, unspecified site: Secondary | ICD-10-CM | POA: Diagnosis present

## 2012-02-27 DIAGNOSIS — I25810 Atherosclerosis of coronary artery bypass graft(s) without angina pectoris: Secondary | ICD-10-CM | POA: Diagnosis present

## 2012-02-27 DIAGNOSIS — Z8673 Personal history of transient ischemic attack (TIA), and cerebral infarction without residual deficits: Secondary | ICD-10-CM

## 2012-02-27 DIAGNOSIS — K5903 Drug induced constipation: Secondary | ICD-10-CM | POA: Diagnosis not present

## 2012-02-27 DIAGNOSIS — Z9861 Coronary angioplasty status: Secondary | ICD-10-CM

## 2012-02-27 DIAGNOSIS — I252 Old myocardial infarction: Secondary | ICD-10-CM

## 2012-02-27 DIAGNOSIS — T40605A Adverse effect of unspecified narcotics, initial encounter: Secondary | ICD-10-CM | POA: Diagnosis not present

## 2012-02-27 DIAGNOSIS — E7800 Pure hypercholesterolemia, unspecified: Secondary | ICD-10-CM

## 2012-02-27 DIAGNOSIS — L02419 Cutaneous abscess of limb, unspecified: Secondary | ICD-10-CM | POA: Diagnosis present

## 2012-02-27 DIAGNOSIS — Z7902 Long term (current) use of antithrombotics/antiplatelets: Secondary | ICD-10-CM

## 2012-02-27 HISTORY — PX: IRRIGATION AND DEBRIDEMENT KNEE: SHX5185

## 2012-02-27 LAB — GLUCOSE, CAPILLARY

## 2012-02-27 SURGERY — IRRIGATION AND DEBRIDEMENT KNEE
Anesthesia: General | Site: Knee | Laterality: Right | Wound class: Dirty or Infected

## 2012-02-27 MED ORDER — CLOPIDOGREL BISULFATE 75 MG PO TABS
75.0000 mg | ORAL_TABLET | Freq: Every day | ORAL | Status: DC
  Administered 2012-02-28 – 2012-03-01 (×3): 75 mg via ORAL
  Filled 2012-02-27 (×3): qty 1

## 2012-02-27 MED ORDER — VANCOMYCIN HCL 1000 MG IV SOLR
1000.0000 mg | INTRAVENOUS | Status: DC | PRN
  Administered 2012-02-27: 1000 mg via INTRAVENOUS

## 2012-02-27 MED ORDER — SUCCINYLCHOLINE CHLORIDE 20 MG/ML IJ SOLN
INTRAMUSCULAR | Status: DC | PRN
  Administered 2012-02-27: 100 mg via INTRAVENOUS

## 2012-02-27 MED ORDER — ASPIRIN EC 81 MG PO TBEC
81.0000 mg | DELAYED_RELEASE_TABLET | Freq: Every day | ORAL | Status: DC
  Administered 2012-02-28 – 2012-03-01 (×3): 81 mg via ORAL
  Filled 2012-02-27 (×3): qty 1

## 2012-02-27 MED ORDER — ALBUTEROL SULFATE HFA 108 (90 BASE) MCG/ACT IN AERS: 2.0000 | INHALATION_SPRAY | RESPIRATORY_TRACT | Status: DC | PRN

## 2012-02-27 MED ORDER — INSULIN ASPART 100 UNIT/ML ~~LOC~~ SOLN
0.0000 [IU] | Freq: Three times a day (TID) | SUBCUTANEOUS | Status: DC
  Administered 2012-02-28: 3 [IU] via SUBCUTANEOUS
  Administered 2012-02-29 (×2): 2 [IU] via SUBCUTANEOUS
  Administered 2012-02-29 – 2012-03-01 (×3): 3 [IU] via SUBCUTANEOUS

## 2012-02-27 MED ORDER — FENTANYL CITRATE 0.05 MG/ML IJ SOLN
INTRAMUSCULAR | Status: DC | PRN
  Administered 2012-02-27: 50 ug via INTRAVENOUS
  Administered 2012-02-27: 100 ug via INTRAVENOUS

## 2012-02-27 MED ORDER — MORPHINE SULFATE 2 MG/ML IJ SOLN
1.0000 mg | INTRAMUSCULAR | Status: DC | PRN
  Administered 2012-02-27: 1 mg via INTRAVENOUS
  Filled 2012-02-27: qty 1

## 2012-02-27 MED ORDER — INSULIN ASPART 100 UNIT/ML ~~LOC~~ SOLN: 0.0000 [IU] | Freq: Every day | SUBCUTANEOUS | Status: DC

## 2012-02-27 MED ORDER — ATORVASTATIN CALCIUM 20 MG PO TABS
20.0000 mg | ORAL_TABLET | Freq: Every day | ORAL | Status: DC
  Administered 2012-02-28 – 2012-02-29 (×2): 20 mg via ORAL
  Filled 2012-02-27 (×3): qty 1

## 2012-02-27 MED ORDER — DEXTROSE 5 % IV SOLN
2.0000 g | Freq: Two times a day (BID) | INTRAVENOUS | Status: DC
  Administered 2012-02-27 – 2012-02-28 (×2): 2 g via INTRAVENOUS
  Filled 2012-02-27 (×3): qty 2

## 2012-02-27 MED ORDER — METOCLOPRAMIDE HCL 5 MG/ML IJ SOLN
5.0000 mg | Freq: Three times a day (TID) | INTRAMUSCULAR | Status: DC | PRN
  Filled 2012-02-27: qty 2

## 2012-02-27 MED ORDER — LACTATED RINGERS IV SOLN
INTRAVENOUS | Status: DC | PRN
  Administered 2012-02-27: 16:00:00 via INTRAVENOUS

## 2012-02-27 MED ORDER — PANTOPRAZOLE SODIUM 40 MG PO TBEC
40.0000 mg | DELAYED_RELEASE_TABLET | Freq: Every day | ORAL | Status: DC
  Administered 2012-02-28 – 2012-03-01 (×2): 40 mg via ORAL
  Filled 2012-02-27 (×2): qty 1

## 2012-02-27 MED ORDER — HYDROCODONE-ACETAMINOPHEN 10-325 MG PO TABS
1.0000 | ORAL_TABLET | ORAL | Status: AC
  Filled 2012-02-27: qty 1

## 2012-02-27 MED ORDER — DOCUSATE SODIUM 100 MG PO TABS: 100.0000 mg | ORAL_TABLET | Freq: Every day | ORAL | Status: DC | PRN

## 2012-02-27 MED ORDER — INSULIN NPH (HUMAN) (ISOPHANE) 100 UNIT/ML ~~LOC~~ SUSP
30.0000 [IU] | Freq: Every day | SUBCUTANEOUS | Status: DC
  Administered 2012-02-28 – 2012-03-01 (×3): 30 [IU] via SUBCUTANEOUS
  Filled 2012-02-27: qty 10

## 2012-02-27 MED ORDER — VANCOMYCIN HCL IN DEXTROSE 1-5 GM/200ML-% IV SOLN
1000.0000 mg | Freq: Two times a day (BID) | INTRAVENOUS | Status: AC
  Administered 2012-02-28: 1000 mg via INTRAVENOUS
  Filled 2012-02-27: qty 200

## 2012-02-27 MED ORDER — ASPIRIN 81 MG PO TABS: 81.0000 mg | ORAL_TABLET | Freq: Every day | ORAL | Status: DC

## 2012-02-27 MED ORDER — INSULIN ASPART 100 UNIT/ML ~~LOC~~ SOLN: 0.0000 [IU] | Freq: Three times a day (TID) | SUBCUTANEOUS | Status: DC

## 2012-02-27 MED ORDER — CARVEDILOL 6.25 MG PO TABS
6.2500 mg | ORAL_TABLET | Freq: Two times a day (BID) | ORAL | Status: DC
  Administered 2012-02-28 – 2012-03-01 (×5): 6.25 mg via ORAL
  Filled 2012-02-27 (×7): qty 1

## 2012-02-27 MED ORDER — OXYCODONE-ACETAMINOPHEN 5-325 MG PO TABS
1.0000 | ORAL_TABLET | ORAL | Status: DC | PRN
  Administered 2012-02-29 – 2012-03-01 (×4): 1 via ORAL
  Filled 2012-02-27: qty 1
  Filled 2012-02-27: qty 2
  Filled 2012-02-27 (×2): qty 1

## 2012-02-27 MED ORDER — INSULIN ASPART 100 UNIT/ML ~~LOC~~ SOLN
4.0000 [IU] | Freq: Three times a day (TID) | SUBCUTANEOUS | Status: DC
  Administered 2012-02-29 – 2012-03-01 (×5): 4 [IU] via SUBCUTANEOUS

## 2012-02-27 MED ORDER — ONDANSETRON HCL 4 MG PO TABS: 4.0000 mg | ORAL_TABLET | Freq: Four times a day (QID) | ORAL | Status: DC | PRN

## 2012-02-27 MED ORDER — SODIUM CHLORIDE 0.9 % IV SOLN
INTRAVENOUS | Status: DC | PRN
  Administered 2012-02-27: 17:00:00 via INTRAVENOUS

## 2012-02-27 MED ORDER — ISOSORBIDE MONONITRATE ER 30 MG PO TB24
30.0000 mg | ORAL_TABLET | Freq: Every day | ORAL | Status: DC
  Administered 2012-02-28 – 2012-03-01 (×3): 30 mg via ORAL
  Filled 2012-02-27 (×3): qty 1

## 2012-02-27 MED ORDER — SODIUM CHLORIDE 0.9 % IR SOLN
Status: DC | PRN
  Administered 2012-02-27: 3000 mL

## 2012-02-27 MED ORDER — PHENYLEPHRINE HCL 10 MG/ML IJ SOLN
INTRAMUSCULAR | Status: DC | PRN
  Administered 2012-02-27: 80 ug via INTRAVENOUS

## 2012-02-27 MED ORDER — TAMSULOSIN HCL 0.4 MG PO CAPS
0.4000 mg | ORAL_CAPSULE | Freq: Every day | ORAL | Status: DC
  Administered 2012-02-28 – 2012-03-01 (×3): 0.4 mg via ORAL
  Filled 2012-02-27 (×3): qty 1

## 2012-02-27 MED ORDER — HYDROCODONE-ACETAMINOPHEN 10-325 MG PO TABS
1.0000 | ORAL_TABLET | ORAL | Status: DC | PRN
  Administered 2012-02-27: 1 via ORAL
  Administered 2012-02-27 – 2012-02-28 (×4): 2 via ORAL
  Administered 2012-02-29 – 2012-03-01 (×4): 1 via ORAL
  Filled 2012-02-27 (×2): qty 1
  Filled 2012-02-27 (×3): qty 2
  Filled 2012-02-27: qty 1
  Filled 2012-02-27: qty 2
  Filled 2012-02-27: qty 1

## 2012-02-27 MED ORDER — HYDROMORPHONE HCL PF 1 MG/ML IJ SOLN
0.2500 mg | INTRAMUSCULAR | Status: DC | PRN
  Administered 2012-02-27: 0.5 mg via INTRAVENOUS

## 2012-02-27 MED ORDER — POTASSIUM CHLORIDE CRYS ER 20 MEQ PO TBCR
20.0000 meq | EXTENDED_RELEASE_TABLET | Freq: Every day | ORAL | Status: DC
  Administered 2012-02-28 – 2012-03-01 (×3): 20 meq via ORAL
  Filled 2012-02-27 (×3): qty 1

## 2012-02-27 MED ORDER — DOCUSATE SODIUM 100 MG PO CAPS
100.0000 mg | ORAL_CAPSULE | Freq: Every day | ORAL | Status: DC | PRN
  Administered 2012-02-29: 100 mg via ORAL
  Filled 2012-02-27: qty 1

## 2012-02-27 MED ORDER — FLUTICASONE-SALMETEROL 100-50 MCG/DOSE IN AEPB
1.0000 | INHALATION_SPRAY | Freq: Two times a day (BID) | RESPIRATORY_TRACT | Status: DC
  Administered 2012-02-27 – 2012-03-01 (×6): 1 via RESPIRATORY_TRACT
  Filled 2012-02-27 (×2): qty 14

## 2012-02-27 MED ORDER — NITROGLYCERIN 0.4 MG SL SUBL: 0.4000 mg | SUBLINGUAL_TABLET | SUBLINGUAL | Status: DC | PRN

## 2012-02-27 MED ORDER — METOCLOPRAMIDE HCL 5 MG PO TABS
5.0000 mg | ORAL_TABLET | Freq: Three times a day (TID) | ORAL | Status: DC | PRN
  Filled 2012-02-27: qty 2

## 2012-02-27 MED ORDER — HYDROMORPHONE HCL PF 1 MG/ML IJ SOLN: 0.2500 mg | INTRAMUSCULAR | Status: DC | PRN

## 2012-02-27 MED ORDER — FUROSEMIDE 40 MG PO TABS
40.0000 mg | ORAL_TABLET | Freq: Two times a day (BID) | ORAL | Status: DC
  Administered 2012-02-27 – 2012-03-01 (×6): 40 mg via ORAL
  Filled 2012-02-27 (×8): qty 1

## 2012-02-27 MED ORDER — ONDANSETRON HCL 4 MG/2ML IJ SOLN: 4.0000 mg | Freq: Four times a day (QID) | INTRAMUSCULAR | Status: DC | PRN

## 2012-02-27 MED ORDER — PROPOFOL 10 MG/ML IV EMUL
INTRAVENOUS | Status: DC | PRN
  Administered 2012-02-27: 100 mg via INTRAVENOUS

## 2012-02-27 MED ORDER — SODIUM CHLORIDE 0.9 % IV SOLN: INTRAVENOUS | Status: DC

## 2012-02-27 MED ORDER — GABAPENTIN 300 MG PO CAPS
300.0000 mg | ORAL_CAPSULE | Freq: Three times a day (TID) | ORAL | Status: DC
  Administered 2012-02-27 – 2012-03-01 (×9): 300 mg via ORAL
  Filled 2012-02-27 (×10): qty 1

## 2012-02-27 SURGICAL SUPPLY — 51 items
BANDAGE ELASTIC 4 VELCRO ST LF (GAUZE/BANDAGES/DRESSINGS) ×2 IMPLANT
BANDAGE ELASTIC 6 VELCRO ST LF (GAUZE/BANDAGES/DRESSINGS) ×2 IMPLANT
BANDAGE GAUZE ELAST BULKY 4 IN (GAUZE/BANDAGES/DRESSINGS) ×4 IMPLANT
BNDG COHESIVE 4X5 TAN STRL (GAUZE/BANDAGES/DRESSINGS) ×2 IMPLANT
BRUSH SCRUB DISP (MISCELLANEOUS) ×2 IMPLANT
CLOTH BEACON ORANGE TIMEOUT ST (SAFETY) ×2 IMPLANT
COVER SURGICAL LIGHT HANDLE (MISCELLANEOUS) ×2 IMPLANT
CUFF TOURNIQUET SINGLE 18IN (TOURNIQUET CUFF) ×2 IMPLANT
CUFF TOURNIQUET SINGLE 24IN (TOURNIQUET CUFF) IMPLANT
CUFF TOURNIQUET SINGLE 34IN LL (TOURNIQUET CUFF) IMPLANT
CUFF TOURNIQUET SINGLE 44IN (TOURNIQUET CUFF) IMPLANT
DRAPE U-SHAPE 47X51 STRL (DRAPES) ×2 IMPLANT
DRSG ADAPTIC 3X8 NADH LF (GAUZE/BANDAGES/DRESSINGS) ×2 IMPLANT
DRSG PAD ABDOMINAL 8X10 ST (GAUZE/BANDAGES/DRESSINGS) ×4 IMPLANT
ELECT REM PT RETURN 9FT ADLT (ELECTROSURGICAL) ×2
ELECTRODE REM PT RTRN 9FT ADLT (ELECTROSURGICAL) ×1 IMPLANT
FACESHIELD LNG OPTICON STERILE (SAFETY) IMPLANT
GLOVE BIOGEL PI ORTHO PRO 7.5 (GLOVE)
GLOVE BIOGEL PI ORTHO PRO SZ8 (GLOVE) ×2
GLOVE ORTHO TXT STRL SZ7.5 (GLOVE) IMPLANT
GLOVE PI ORTHO PRO STRL 7.5 (GLOVE) IMPLANT
GLOVE PI ORTHO PRO STRL SZ8 (GLOVE) ×2 IMPLANT
GLOVE SURG ORTHO 8.5 STRL (GLOVE) ×2 IMPLANT
GOWN STRL NON-REIN LRG LVL3 (GOWN DISPOSABLE) ×2 IMPLANT
GOWN STRL REIN XL XLG (GOWN DISPOSABLE) ×4 IMPLANT
HANDPIECE INTERPULSE COAX TIP (DISPOSABLE) ×1
KIT BASIN OR (CUSTOM PROCEDURE TRAY) ×2 IMPLANT
KIT ROOM TURNOVER OR (KITS) ×2 IMPLANT
MANIFOLD NEPTUNE II (INSTRUMENTS) ×2 IMPLANT
NEEDLE 18GX1X1/2 (RX/OR ONLY) (NEEDLE) ×2 IMPLANT
NS IRRIG 1000ML POUR BTL (IV SOLUTION) ×2 IMPLANT
PACK ORTHO EXTREMITY (CUSTOM PROCEDURE TRAY) ×2 IMPLANT
PAD ARMBOARD 7.5X6 YLW CONV (MISCELLANEOUS) ×4 IMPLANT
PENCIL BUTTON HOLSTER BLD 10FT (ELECTRODE) IMPLANT
SET HNDPC FAN SPRY TIP SCT (DISPOSABLE) ×1 IMPLANT
SPONGE GAUZE 4X4 12PLY (GAUZE/BANDAGES/DRESSINGS) ×2 IMPLANT
SPONGE LAP 18X18 X RAY DECT (DISPOSABLE) ×2 IMPLANT
SPONGE LAP 4X18 X RAY DECT (DISPOSABLE) IMPLANT
STOCKINETTE IMPERVIOUS 9X36 MD (GAUZE/BANDAGES/DRESSINGS) IMPLANT
SUT ETHILON 2 0 FS 18 (SUTURE) IMPLANT
SUT ETHILON 4 0 FS 1 (SUTURE) IMPLANT
SUT VIC AB 2-0 CT1 27 (SUTURE)
SUT VIC AB 2-0 CT1 TAPERPNT 27 (SUTURE) IMPLANT
SYR CONTROL 10ML LL (SYRINGE) ×2 IMPLANT
TOWEL OR 17X24 6PK STRL BLUE (TOWEL DISPOSABLE) ×2 IMPLANT
TOWEL OR 17X26 10 PK STRL BLUE (TOWEL DISPOSABLE) ×2 IMPLANT
TUBE ANAEROBIC SPECIMEN COL (MISCELLANEOUS) ×2 IMPLANT
TUBE CONNECTING 12X1/4 (SUCTIONS) ×2 IMPLANT
UNDERPAD 30X30 INCONTINENT (UNDERPADS AND DIAPERS) ×2 IMPLANT
WATER STERILE IRR 1000ML POUR (IV SOLUTION) IMPLANT
YANKAUER SUCT BULB TIP NO VENT (SUCTIONS) ×2 IMPLANT

## 2012-02-27 NOTE — Progress Notes (Signed)
Patient with multiple serious medical problems at high surgical risk.  Patient has a severe right knee and leg infection which has not responded to this point on po antibiotics and in fact has progressed.  Patient requires urgent/emergent surgical management.  Patient and his wife understand this and agree to this treatment plan.  Verlee Rossetti, MD

## 2012-02-27 NOTE — Addendum Note (Signed)
Addendum  created 02/27/12 1832 by Judie Petit, MD   Modules edited:Orders, PRL Based Order Sets

## 2012-02-27 NOTE — Consult Note (Signed)
[facility] Medical Consultation  JAKIAH GOREE ZOX:[id number] DOB: 11/18/1945 DOA: 02/27/2012 PCP: [name], MD   Requesting physician: Dr.Norris Date of consultation: 02/27/12 Reason for consultation: medical management  Impression/Recommendations 1. Septic prepatellar bursitis with cellulitis For emergent I&D today per Dr.Norris Add Vancomycin and Zosyn if you agree, please send cultures from OR Cardiac wise patient is  high risk given STEMI 6months ago, requiring stenting of distal SVG to diagonal graft, after this had myoview in 4/13 EF of 25%, no ischemia, large inferolateral scar EKG unchanged from before Continue ASA/plavix/imdur/coreg/statin, took AM dose of these meds Extreme caution with excess IVF Hopefully femoral nerve block could be used for anesthesia, defer to anesthesia eval Due to emergent need for I&D , no further workup recommended at this time  2. CAD/CABG/PCI/STEMI in 1/13 as noted above, no further workup now given emergent nature of procedure Continue ASA/plavix/imdur/coreg/statin, took AM dose of these meds  3. Chronic systolic CHF/EF of 25% Currently compensated, continue Coreg, cut down dose of lasix to 40mg  BID, could be increased in next day or so Hold ARB today  3. DM: resume insulin at lower dose, SSI  4. Prophylaxis: PPI,  lovenox when ok w/ ortho  Thank you for this consultation. Will FU in am  Chief Complaint: knee swelling/redness  HPI: Mr. Husak is a 66 year old male with extensive cardiac history, diabetes, hypertension, peripheral arterial disease, obesity is admitted by Ortho for emergent I&D of suspected septic prepatellar bursitis and cellulitis. Medical consultation was obtained according to multitude of medical problems. Patient and wife report injuring his knee after being on a door frame, this was followed by redness and swelling which progressed to current state. He was started on Augmentin towards the and of last week  however did not improve with this. In terms of his cardiac problems he is had a CABG, followed by a STEMI in 1/13 wherein he was found to have extensive disease of his grafts, underwent stenting on SVG to diagonal, he had a Myoview in 4/13 2 extensive inferolateral scar, EF of 25% without any inducible ischemia. He reports he's been doing well from a cardiac standpoint, denies any chest pain or shortness of breath at this time. Reports dyspnea with exertion which he's had for a good while.  Review of Systems:  12 system review done Positive per history of present illness, and dyspnea on exertion  Past Medical History  Diagnosis Date  . CORONARY ARTERY DISEASE 03/11/2007    s/p CABG 1998;  admitted 08/2011 with an inferior STEMI.  LHC 09/07/11: Severe  Three-vessel CAD, LIMA-LAD patent, SVG-RCA chronically occluded, SVG-circumflex chronically occluded, SVG-diagonal 95%.  PCI: Promus DES to the SVG-diagonal.  Procedure c/b transient CHB req CPR and TTVP;  myoview 4/13:large severe inferolateral infarct, no ischemia, EF 25%    . DIABETES MELLITUS, TYPE I 03/11/2007  . HYPERTENSION 03/11/2007  . Proliferative diabetic retinopathy 03/09/2009  . HYPERCHOLESTEROLEMIA 09/28/2008  . CERVICAL RADICULOPATHY, LEFT 09/23/2008  . ANEMIA-IRON DEFICIENCY 09/23/2008  . PERIPHERAL NEUROPATHY 09/23/2008  . Chronic systolic heart failure 09/23/2008    Echocardiogram 09/07/11: Difficult study, inferior and posterior lateral wall hypokinesis, mild LVH, EF 45%, mild LAE.   [name] RENAL INSUFFICIENCY 03/15/2010  . PERIPHERAL VASCULAR INSUFFICIENCY,  LEGS, BILATERAL 08/17/2010    ABI's performed 04/19/11 R 0.72 L 0.71  . ED (erectile dysfunction)   . Hypogonadism male   . GI bleed 2012    Multiple Duodenal ulcer   . Obesity   . Legally blind  bilaterally  . Stroke 35    age 58 months; residual right sided weakness  . History of blood transfusion 2012  . Charcot foot due to diabetes mellitus     chronic pain  . Osteoarthritis      right hip and shoulder   Past Surgical History  Procedure Date  . Carpal tunnel release     left  . Sigmoidoscopy 03/11/2001  . Venous doppler 01/30/2004  . Eye surgery   . Fracture surgery   . Tonsillectomy and adenoidectomy     "as a kid"  . Cataract extraction w/ intraocular lens  implant, bilateral   . Coronary artery bypass graft 1998    CABG X5  . Coronary angioplasty with stent placement 08/2011    "1"  . Retinopathy surgery 2000's    "laser; both eyes"  . Wrist fusion 1976    right  . Anterior fusion cervical spine 2010    "C spine; Dr. Yetta Barre"  . Ankle fracture surgery 1976; ?date    LEFT:  fused; removed bulk of hardware  . Heel spur surgery ~ 2007    ? left   Social History:  reports that he has never smoked. He has never used smokeless tobacco. He reports that he does not drink alcohol or use illicit drugs.  Allergies  Allergen Reactions  . Sildenafil Nausea Only and Other (See Comments)    "took it once; heart went to pieces; never took it again"  . Contrast Media (Iodinated Diagnostic Agents) Nausea Only   Family History  Problem Relation Age of Onset  . Heart disease Mother     Coronary Artery Disease  . Heart disease Father     Coronary Artery Disease  . Heart disease Paternal Grandmother     Coronary Artery Disease  . Colon cancer Neg Hx   . Diabetes Other     Grandmother    Prior to Admission medications   Medication Sig Start Date End Date Taking? Authorizing Provider  albuterol (PROVENTIL HFA;VENTOLIN HFA) 108 (90 BASE) MCG/ACT inhaler Inhale 2 puffs into the lungs every 4 (four) hours as needed. For shortness of breath    Historical Provider, MD  aspirin 81 MG tablet Take 81 mg by mouth daily.    Historical Provider, MD  carvedilol (COREG) 6.25 MG tablet Take 6.25 mg by mouth 2 (two) times daily with a meal. 11/16/11 11/15/12  [name], MD  clopidogrel (PLAVIX) 75 MG tablet Take 75 mg by mouth daily with breakfast. 11/16/11 11/15/12  [name], MD  Docusate Sodium (STOOL SOFTENER) 100 MG capsule Take 100 mg by mouth daily as needed. constipation    Historical Provider, MD  Fluticasone-Salmeterol (ADVAIR DISKUS) 100-50 MCG/DOSE AEPB Inhale 1 puff into the lungs 2 (two) times daily. 12/25/11   [name], MD  furosemide (LASIX) 80 MG tablet Take 80-160 mg by mouth 2 (two) times daily. May take an extra 80 mg as needed    Historical Provider, MD  gabapentin (NEURONTIN) 300 MG capsule Take 1 capsule (300 mg total) by mouth 3 (three) times daily. 01/27/12 01/26/13  Eustaquio Boyden, MD  HYDROcodone-acetaminophen (NORCO) 10-325 MG per tablet Take 1-2 tablets by mouth every 4 (four) hours as needed. For pain 02/06/12   [name], MD  insulin NPH (HUMULIN N,NOVOLIN N) 100 UNIT/ML injection Inject 55 Units into the skin daily before breakfast.     Historical Provider, MD  isosorbide mononitrate (IMDUR) 30 MG 24 hr tablet Take 30 mg by mouth  daily. 01/13/12 01/12/13  Dayna N Dunn, PA  losartan (COZAAR) 50 MG tablet Take 50 mg by mouth daily. 04/13/11 04/12/12  Tonny Bollman, MD  nitroGLYCERIN (NITROSTAT) 0.4 MG SL tablet Place 1 tablet (0.4 mg total) under the tongue every 5 (five) minutes as needed. For chest pain 11/30/11 11/29/12  Beatrice Lecher, PA  NOVOLOG FLEXPEN 100 UNIT/ML injection Inject 20 Units into the skin 2 (two) times daily as needed. Sliding scale 10/07/11   Historical Provider, MD  pantoprazole (PROTONIX) 40 MG tablet Take 40 mg by mouth daily. 01/13/12 01/12/13  Dayna N Dunn, PA  potassium chloride SA (K-DUR,KLOR-CON) 20 MEQ tablet Take 20 mEq by mouth daily.    Historical Provider, MD  rosuvastatin (CRESTOR) 20 MG tablet Take 20 mg by mouth daily. 11/16/11   [name], MD  spironolactone (ALDACTONE) 25 MG tablet Take 12.5 mg by mouth daily. 09/15/11   Joline Salt Barrett, PA  Tamsulosin HCl (FLOMAX) 0.4 MG CAPS Take 0.4 mg by mouth daily. 09/15/11   Darrol Jump, PA   Physical Exam: There were no vitals taken for this visit. There  were no vitals filed for this visit.   General:  AAO x3, no distress  Eyes: PERRLA  ENT:  Oral mucosa moist and pink  Neck: no JVD  Cardiovascular: SiS2/RRR, no m/r/g  Respiratory: CTAB  Abdomen: soft, obese, NT, BS present  Skin: erythema, chronic venous stasis changes in both lower extremity  Musculoskeletal: Right lower leg with redness swelling and warmth, fluctuation prepatellar bursa  Psychiatric: appropriate mood and affect  Neurologic: No localizing signs  Labs on Admission:  Basic Metabolic Panel: No results found for this basename: NA:5,K:5,CL:5,CO2:5,GLUCOSE:5,BUN:5,CREATININE:5,CALCIUM:5,MG:5,PHOS:5 in the last 168 hours Liver Function Tests: No results found for this basename: AST:5,ALT:5,ALKPHOS:5,BILITOT:5,PROT:5,ALBUMIN:5 in the last 168 hours No results found for this basename: LIPASE:5,AMYLASE:5 in the last 168 hours No results found for this basename: AMMONIA:5 in the last 168 hours CBC: No results found for this basename: WBC:5,NEUTROABS:5,HGB:5,HCT:5,MCV:5,PLT:5 in the last 168 hours Cardiac Enzymes: No results found for this basename: CKTOTAL:5,CKMB:5,CKMBINDEX:5,TROPONINI:5 in the last 168 hours BNP: No components found with this basename: POCBNP:5 CBG: No results found for this basename: GLUCAP:5 in the last 168 hours  Radiological Exams on Admission: No results found.  EKG: Independently reviewed. normal sinus rhythm, Q waves in inferior leads unchanged from prior  Time spent:  [facility] Pager [phone]  If 7PM-7AM, please contact night-coverage [email or web address] Password [facility] 02/27/2012, 3:35 PM

## 2012-02-27 NOTE — Brief Op Note (Signed)
02/27/2012  5:26 PM  PATIENT:  Mark French  66 y.o. male  PRE-OPERATIVE DIAGNOSIS:  Right knee infection with cellulitis  POST-OPERATIVE DIAGNOSIS: Septic Bursitis Right knee, no evidence for joint infection  PROCEDURE:  Procedure(s) (LRB): IRRIGATION AND DEBRIDEMENT KNEE (Right), packed open  SURGEON:  Surgeon(s) and Role:    * Verlee Rossetti, MD - Primary  PHYSICIAN ASSISTANT:   ASSISTANTS: none   ANESTHESIA:   general  EBL:     BLOOD ADMINISTERED:none  DRAINS: none   LOCAL MEDICATIONS USED:  NONE  SPECIMEN: 1.  Aspirate of knee joint  2. Purulent material from pre-patellar bursa DISPOSITION OF SPECIMEN:  micro  COUNTS:  YES  TOURNIQUET:  * No tourniquets in log *  DICTATION: .Other Dictation: Dictation Number [id number]  PLAN OF CARE: Admit to inpatient   PATIENT DISPOSITION:  PACU - hemodynamically stable.   Delay start of Pharmacological VTE agent (>24hrs) due to surgical blood loss or risk of bleeding: not applicable

## 2012-02-27 NOTE — Anesthesia Postprocedure Evaluation (Signed)
  Anesthesia Post-op Note  Patient: Mark French  Procedure(s) Performed: Procedure(s) (LRB): IRRIGATION AND DEBRIDEMENT KNEE (Right)  Patient Location: PACU  Anesthesia Type: General  Level of Consciousness: awake  Airway and Oxygen Therapy: Patient Spontanous Breathing  Post-op Pain: mild  Post-op Assessment: Post-op Vital signs reviewed  Post-op Vital Signs: Reviewed  Complications: No apparent anesthesia complications

## 2012-02-27 NOTE — H&P (Signed)
NAMEDAYSON, ABOUD NO.:  [id number]  MEDICAL RECORD NO.:  [id number]  LOCATION:  5N15C                        FACILITY:  [facility]  PHYSICIAN:  Almedia Balls. Ranell Patrick, M.D. DATE OF BIRTH:  1946-08-03  DATE OF ADMISSION:  02/27/2012 DATE OF DISCHARGE:                             HISTORY & PHYSICAL   ADMITTING DIAGNOSIS:  Infected prepatellar bursitis and cellulitis, right leg.  HISTORY OF PRESENT ILLNESS:  This is a 66 year old gentleman, who was seen last Friday after banging his knee on a door frame in his motorized scooter 2 days earlier.  At that time, he had some mild redness about the knee.  No fluid in the prepatellar bursa, only mild discomfort.  No streaking.  No fevers.  No constitutional symptoms.  He was seen and evaluated, placed on Augmentin 875 b.i.d., and told to follow up in the office today.  He returns today for a recheck of his wound.  He is still afebrile, however, he now has fluid and increased redness about the knee and down inferior to the knee compatible with the pre patella infected bursitis and cellulitis.  There is no effusion in the knee joint itself and he does not appear to have a septic arthritis.  At this point, with his medical history with diabetes, the fact that he has gotten worse on the antibiotics rather than better and now has fluid in his prepatellar bursa, he is to be admitted to the hospital for I and D of his prepatellar bursa and IV antibiotic therapy.  Note that his medical doctor is [name] at [facility].  His cardiologist is Dr. Excell Seltzer at [facility].  DRUG ALLERGIES:  Iodinated contrast.  CURRENT MEDICATIONS: 1. Proventil inhaler 108 mcg per actuation 2 puffs every 4 hours as     needed for shortness of breath. 2. Aspirin 81 mg daily. 3. Coreg 6.25 mg 2 times daily with a meal. 4. Plavix 75 mg once a day. 5. Docusate sodium 100 mg daily p.r.n. constipation. 6. Advair 100/50 mcg per dose 1 puff twice  a day. 7. Lasix 80 mg 2 times a day. 8. Neurontin 300 mg 1 capsule t.i.d. 9. Norco 10/325 one to two q.4 p.r.n. pain. 10.Insulin, NovoLog, FlexPen 100 units/mL inject 20 units into the     skin 2 times daily as needed on sliding scale. 11.Humulin N 100 units per mL inject 50 units into the skin daily     before breakfast. 12.Imdur 30 mg 1 daily. 13.Losartan 50 mg 1 daily. 14.Nitroglycerin sublingual 0.4 mg 1 tablet under the tongue p.r.n.     chest pain. 15.Protonix 40 mg once a day. 16.K-Dur or 20 mEq once a day. 17.Crestor 20 mEq once a day. 18.Spironolactone 25 mg, take 1/2 tablet daily. 19.Tamsulosin 0.4 mg daily. 20.He has been on Augmentin 875 b.i.d.  SERIOUS MEDICAL ILLNESSES:  Include: 1. Diabetes. 2. Coronary artery disease. 3. History of MI. 4. Hypertension. 5. History of stents. 6. The patient is legally blind. 7. 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  PREVIOUS SURGERIES:  Include: 1. Wrist surgery. 2. Foot surgery. 3. CABG. 4. Stents in the coronary arteries. 5. Laser eye surgery  multiple times. 6. Cervical neck fusion.  SOCIAL HISTORY:  The patient lives at home with his wife.  He does not smoke or drink.  FAMILY HISTORY:  Positive for diabetes and coronary artery disease.  REVIEW OF SYSTEMS:  CENTRAL NERVOUS SYSTEM:  Positive for stroke as a child, also the patient is legally blind.  PULMONARY:  Positive for exertional shortness of breath.  CARDIOVASCULAR:  Positive for MI and coronary artery disease with intermittent angina.  GI:  Positive for history of ulcer.  GU:  Positive for BPH.  MUSCULOSKELETAL:  Positive in HPI.  PHYSICAL EXAMINATION:  VITAL SIGNS:  Blood pressure 93/53, pulse 70 and regular, temperature 98. HEENT:  Head normocephalic and atraumatic.  Pupils equal, round, and reactive.  Nose patent.  Throat without injection. NECK:  Supple without adenopathy.  Carotids 2+ without bruits. CHEST:  Clear to auscultation.  No rales or rhonchi.   Respirations 14. HEART:  Regular rate and rhythm at 78 beats per minute without murmur. ABDOMEN:  Soft, with sluggish bowel sounds.  No masses or organomegaly. NEUROLOGIC:  The patient is alert and oriented to time, place, and person.  Cranial nerves II through XII grossly intact. EXTREMITIES:  The right lower leg with anterior lower leg cellulitis and redness and fluctuation at the prepatellar bursa.  No palpable knee joint effusion.  There is no streaking proximal to the prepatellar bursa region.  With the exception of diabetic neuropathy, neurovascular status otherwise intact.  ASSESSMENT:  Septic prepatellar bursitis with cellulitis.  PLAN:  At this time, we will directly admit the patient to [facility] for probable I and D later tonight and IV antibiotic therapy.  We will get a medical consult with the hospitalist to help manage his multiple medical problems.     Jaquelyn Bitter. Lorn Butcher, P.A.   ______________________________ Almedia Balls Ranell Patrick, M.D.    SJC/MEDQ  D:  02/27/2012  T:  02/27/2012  Job:  [id number]

## 2012-02-27 NOTE — Transfer of Care (Signed)
Immediate Anesthesia Transfer of Care Note  Patient: Mark French  Procedure(s) Performed: Procedure(s) (LRB): IRRIGATION AND DEBRIDEMENT KNEE (Right)  Patient Location: PACU  Anesthesia Type: General  Level of Consciousness: awake and alert   Airway & Oxygen Therapy: Patient Spontanous Breathing and Patient connected to nasal cannula oxygen  Post-op Assessment: Report given to PACU RN and Post -op Vital signs reviewed and stable  Post vital signs: Reviewed and stable  Complications: No apparent anesthesia complications

## 2012-02-27 NOTE — Anesthesia Preprocedure Evaluation (Addendum)
Anesthesia Evaluation  Patient identified by MRN, date of birth, ID band Patient awake    Reviewed: Allergy & Precautions, H&P , NPO status , Patient's Chart, lab work & pertinent test results, reviewed documented beta blocker date and time   Airway Mallampati: I TM Distance: <3 FB     Dental  (+) Edentulous Upper, Edentulous Lower and Dental Advisory Given   Pulmonary shortness of breath and with exertion,  breath sounds clear to auscultation        Cardiovascular hypertension, Pt. on medications + angina with exertion + CAD, + Past MI and + Cardiac Stents     Neuro/Psych Anxiety Depression  Neuromuscular disease CVA, No Residual Symptoms    GI/Hepatic   Endo/Other  Well Controlled, Type 2  Renal/GU      Musculoskeletal   Abdominal (+)  Abdomen: soft. Bowel sounds: normal.  Peds  Hematology   Anesthesia Other Findings   Reproductive/Obstetrics                         Anesthesia Physical Anesthesia Plan  ASA: III and Emergent  Anesthesia Plan: General   Post-op Pain Management:    Induction: Intravenous  Airway Management Planned: Oral ETT  Additional Equipment:   Intra-op Plan:   Post-operative Plan: Extubation in OR  Informed Consent: I have reviewed the patients History and Physical, chart, labs and discussed the procedure including the risks, benefits and alternatives for the proposed anesthesia with the patient or authorized representative who has indicated his/her understanding and acceptance.     Plan Discussed with: CRNA, Anesthesiologist and Surgeon  Anesthesia Plan Comments:         Anesthesia Quick Evaluation

## 2012-02-27 NOTE — Interval H&P Note (Signed)
History and Physical Interval Note:  02/27/2012 4:02 PM  Mark French  has presented today for surgery, with the diagnosis of Right knee infection with cellulitis  The various methods of treatment have been discussed with the patient and family. After consideration of risks, benefits and other options for treatment, the patient has consented to  Procedure(s) (LRB): IRRIGATION AND DEBRIDEMENT KNEE (Right) as a surgical intervention .  The patient's history has been reviewed, patient examined, no change in status, stable for surgery.  I have reviewed the patient's chart and labs.  Questions were answered to the patient's satisfaction.     Gerik Coberly,STEVEN R

## 2012-02-27 NOTE — Anesthesia Postprocedure Evaluation (Signed)
Anesthesia Post Note  Patient: Mark French  Procedure(s) Performed: Procedure(s) (LRB): IRRIGATION AND DEBRIDEMENT KNEE (Right)  Anesthesia type: General  Patient location: PACU  Post pain: Pain level controlled and Adequate analgesia  Post assessment: Post-op Vital signs reviewed, Patient's Cardiovascular Status Stable, Respiratory Function Stable, Patent Airway and Pain level controlled  Last Vitals:  Filed Vitals:   02/27/12 1745  BP:   Pulse: 98  Temp:   Resp: 21    Post vital signs: Reviewed and stable  Level of consciousness: awake, alert  and oriented  Complications: No apparent anesthesia complications

## 2012-02-27 NOTE — H&P (Signed)
  H&P performed Dictation # [id number]

## 2012-02-28 ENCOUNTER — Encounter (HOSPITAL_COMMUNITY): Payer: Self-pay | Admitting: Anesthesiology

## 2012-02-28 DIAGNOSIS — I25810 Atherosclerosis of coronary artery bypass graft(s) without angina pectoris: Secondary | ICD-10-CM

## 2012-02-28 DIAGNOSIS — I5022 Chronic systolic (congestive) heart failure: Secondary | ICD-10-CM

## 2012-02-28 DIAGNOSIS — E119 Type 2 diabetes mellitus without complications: Secondary | ICD-10-CM

## 2012-02-28 DIAGNOSIS — I2510 Atherosclerotic heart disease of native coronary artery without angina pectoris: Secondary | ICD-10-CM

## 2012-02-28 DIAGNOSIS — I252 Old myocardial infarction: Secondary | ICD-10-CM

## 2012-02-28 DIAGNOSIS — M7040 Prepatellar bursitis, unspecified knee: Secondary | ICD-10-CM

## 2012-02-28 DIAGNOSIS — R609 Edema, unspecified: Secondary | ICD-10-CM

## 2012-02-28 DIAGNOSIS — R0602 Shortness of breath: Secondary | ICD-10-CM

## 2012-02-28 DIAGNOSIS — I739 Peripheral vascular disease, unspecified: Secondary | ICD-10-CM

## 2012-02-28 DIAGNOSIS — L0291 Cutaneous abscess, unspecified: Secondary | ICD-10-CM

## 2012-02-28 DIAGNOSIS — B9689 Other specified bacterial agents as the cause of diseases classified elsewhere: Secondary | ICD-10-CM

## 2012-02-28 LAB — COMPREHENSIVE METABOLIC PANEL
ALT: 25 U/L (ref 0–53)
AST: 28 U/L (ref 0–37)
Albumin: 2.8 g/dL — ABNORMAL LOW (ref 3.5–5.2)
CO2: 27 mEq/L (ref 19–32)
Calcium: 9 mg/dL (ref 8.4–10.5)
GFR calc non Af Amer: 65 mL/min — ABNORMAL LOW (ref >90)
Sodium: 132 mEq/L — ABNORMAL LOW (ref 135–145)
Total Protein: 6.9 g/dL (ref 6.0–8.3)

## 2012-02-28 LAB — GLUCOSE, CAPILLARY
Glucose-Capillary: 114 mg/dL — ABNORMAL HIGH (ref 70–99)
Glucose-Capillary: 114 mg/dL — ABNORMAL HIGH (ref 70–99)
Glucose-Capillary: 163 mg/dL — ABNORMAL HIGH (ref 70–99)
Glucose-Capillary: 131 mg/dL — ABNORMAL HIGH (ref 70–99)

## 2012-02-28 LAB — CBC
HCT: 34.2 % — ABNORMAL LOW (ref 39.0–52.0)
Hemoglobin: 10.9 g/dL — ABNORMAL LOW (ref 13.0–17.0)
MCH: 26.8 pg (ref 26.0–34.0)
MCHC: 31.9 g/dL (ref 30.0–36.0)
RDW: 15 % (ref 11.5–15.5)

## 2012-02-28 MED ORDER — CEFAZOLIN SODIUM-DEXTROSE 2-3 GM-% IV SOLR
2.0000 g | INTRAVENOUS | Status: DC
  Filled 2012-02-28: qty 50

## 2012-02-28 MED ORDER — CHLORHEXIDINE GLUCONATE 4 % EX LIQD
60.0000 mL | Freq: Once | CUTANEOUS | Status: DC
Start: 2012-02-28 — End: 2012-03-01

## 2012-02-28 MED ORDER — VANCOMYCIN HCL IN DEXTROSE 1-5 GM/200ML-% IV SOLN
1000.0000 mg | Freq: Two times a day (BID) | INTRAVENOUS | Status: DC
Start: 2012-02-28 — End: 2012-03-01
  Administered 2012-02-28 – 2012-03-01 (×4): 1000 mg via INTRAVENOUS
  Filled 2012-02-28 (×6): qty 200

## 2012-02-28 MED ORDER — TERBINAFINE HCL 1 % EX CREA
TOPICAL_CREAM | Freq: Every day | CUTANEOUS | Status: DC
  Administered 2012-02-28 – 2012-03-01 (×3): via TOPICAL
  Filled 2012-02-28: qty 12

## 2012-02-28 NOTE — Progress Notes (Signed)
Orthopedics Progress Note  Subjective: "I feel a lot better today.  Yesterday I thought I was going to die."  Objective:  Filed Vitals:   02/28/12 1000  BP: 124/71  Pulse: 67  Temp: 97.7 F (36.5 C)  Resp: 20    General: Awake and alert  Musculoskeletal: R leg with significant improvement in the erythema and the swelling over yesterday. Dressing intact(changed this AM by Thea Gist, PA-C) Neurovascularly intact  Lab Results  Component Value Date   WBC 11.4* 02/28/2012   HGB 10.9* 02/28/2012   HCT 34.2* 02/28/2012   MCV 84.0 02/28/2012   PLT 261 02/28/2012       Component Value Date/Time   NA 132* 02/28/2012 0538   K 4.3 02/28/2012 0538   CL 93* 02/28/2012 0538   CO2 27 02/28/2012 0538   GLUCOSE 126* 02/28/2012 0538   BUN 23 02/28/2012 0538   CREATININE 1.13 02/28/2012 0538   CALCIUM 9.0 02/28/2012 0538   CALCIUM 8.7 12/15/2010 1645   GFRNONAA 65* 02/28/2012 0538   GFRAA 75* 02/28/2012 0538    Lab Results  Component Value Date   INR 0.98 01/11/2012   INR 0.95 09/07/2011   INR 1.07 08/22/2011    Assessment/Plan: POD #1 s/p Procedure(s): IRRIGATION AND DEBRIDEMENT KNEE Clinically improved.  Cultures pending. Continue broad spectrum antibiotics. ID following, consult pending. DM/cardiac issues stable, internal medicine following, thanks!  Almedia Balls. Ranell Patrick, MD 02/28/2012 1:09 PM

## 2012-02-28 NOTE — Progress Notes (Signed)
[facility] PROGRESS NOTE  Mark French ZOX:[id number] DOB: 1946-01-16 DOA: 02/27/2012 PCP: [name], MD  Assessment/Plan: Principal Problem:  *Septic prepatellar bursitis of right knee Active Problems:  HYPERTENSION  Coronary atherosclerosis of autologous vein bypass graft  Chronic systolic heart failure  DM2 (diabetes mellitus, type 2)  Cellulitis  1. Septic prepatellar bursitis of right knee - Per ortho and ID  2. Hypertension - At this point would plan on continuing current regimen as blood pressures have been relatively well controlled. Last BP 105/66 - Pt is on carvedilol, isosorbide mononitrate  3. CAD s/p CABG with h/o Stemi - Continue aspirin, plavix, imdur, coreg, statin  4. DM - Blood sugars well controlled and have ranged from 98-163.  Continue current regimen. Pt is on NPH at 30 units  qac at breakfast with SSI coverage  5. Chronic Systolic CHF/EF: Last echo 01/12/12. Mentioned that LVEF appeared normal with inferior wall appearing hypokinetic.  Currently compensated.  Would continue lasix at current dose and monitor.  Consider placing on home regimen tomorrow.     Code Status: Full Family Communication: Spoke with patient and family at bedside Disposition Plan: Pending clinical improvement and recommendations from specialist  Consultants:  Internal medicine  Infectious disease  Procedures: IRRIGATION AND DEBRIDEMENT of right KNEE   Antibiotics:  Currently on Vancomycin  HPI/Subjective: Patient mentions that he feels better.  States that the pain is tolerable.  Denies any fever, chills, abdominal discomfort.  Mentions that he feels like the medication is constipating him.  Objective: Filed Vitals:   02/28/12 1000 02/28/12 1451 02/28/12 1700 02/28/12 1800  BP: 124/71 105/57  105/66  Pulse: 67 84  72  Temp: 97.7 F (36.5 C) 98.1 F (36.7 C)  98.5 F (36.9 C)  TempSrc: Oral Oral  Oral  Resp: 20 18  18   Height:   5' 6.14" (1.68 m)    Weight:   101.6 kg (223 lb 15.8 oz)   SpO2: 92% 94%  95%    Intake/Output Summary (Last 24 hours) at 02/28/12 1824 Last data filed at 02/28/12 1452  Gross per 24 hour  Intake    720 ml  Output    550 ml  Net    170 ml    Exam:   General:  Pt in NAD, A and O x 3  Cardiovascular: RRR, no M/R/G  Respiratory: Clear to auscultation, no wheezes  Abdomen: soft, nt, nd  Extremities: gauze in place over right knee with no active bleeding.  Data Reviewed: Basic Metabolic Panel:  Lab 02/28/12 8119  NA 132*  K 4.3  CL 93*  CO2 27  GLUCOSE 126*  BUN 23  CREATININE 1.13  CALCIUM 9.0  MG --  PHOS --   Liver Function Tests:  Lab 02/28/12 0538  AST 28  ALT 25  ALKPHOS 63  BILITOT 0.4  PROT 6.9  ALBUMIN 2.8*   No results found for this basename: LIPASE:5,AMYLASE:5 in the last 168 hours No results found for this basename: AMMONIA:5 in the last 168 hours CBC:  Lab 02/28/12 0538  WBC 11.4*  NEUTROABS --  HGB 10.9*  HCT 34.2*  MCV 84.0  PLT 261   Cardiac Enzymes: No results found for this basename: CKTOTAL:5,CKMB:5,CKMBINDEX:5,TROPONINI:5 in the last 168 hours BNP (last 3 results)  Basename 01/30/12 1619 01/16/12 1635 12/31/11 2333  PROBNP 207.0* 519.1* 676.3*   CBG:  Lab 02/28/12 1547 02/28/12 1143 02/28/12 0735 02/27/12 2026 02/27/12 1744  GLUCAP 163* 114* 114* 98 108*  Recent Results (from the past 240 hour(s))  CULTURE, ROUTINE-ABSCESS     Status: Normal (Preliminary result)   Collection Time   02/27/12  5:20 PM      Component Value Range Status Comment   Specimen Description ABSCESS KNEE RIGHT   Final    Special Requests NONE   Final    Gram Stain PENDING   Incomplete    Culture NO GROWTH 1 DAY   Final    Report Status PENDING   Incomplete   ANAEROBIC CULTURE     Status: Normal (Preliminary result)   Collection Time   02/27/12  5:20 PM      Component Value Range Status Comment   Specimen Description ABSCESS KNEE RIGHT   Final    Special  Requests NONE   Final    Gram Stain     Final    Value: NO WBC SEEN     NO SQUAMOUS EPITHELIAL CELLS SEEN     NO ORGANISMS SEEN   Culture     Final    Value: NO ANAEROBES ISOLATED; CULTURE IN PROGRESS FOR 5 DAYS   Report Status PENDING   Incomplete   BODY FLUID CULTURE     Status: Normal (Preliminary result)   Collection Time   02/27/12  5:27 PM      Component Value Range Status Comment   Specimen Description SYNOVIAL FLUID KNEE RIGHT   Final    Special Requests ASPIRATION   Final    Gram Stain     Final    Value: FEW WBC PRESENT,BOTH PMN AND MONONUCLEAR     RARE GRAM POSITIVE COCCI IN PAIRS   Culture NO GROWTH 1 DAY   Final    Report Status PENDING   Incomplete      Studies: No results found.  Scheduled Meds:   . aspirin EC  81 mg Oral Daily  . atorvastatin  20 mg Oral q1800  . carvedilol  6.25 mg Oral BID WC  . chlorhexidine  60 mL Topical Once  . clopidogrel  75 mg Oral Q breakfast  . Fluticasone-Salmeterol  1 puff Inhalation BID  . furosemide  40 mg Oral BID  . gabapentin  300 mg Oral TID  . HYDROcodone-acetaminophen  1 tablet Oral To PACU  . insulin aspart  0-15 Units Subcutaneous TID WC  . insulin aspart  0-5 Units Subcutaneous QHS  . insulin aspart  4 Units Subcutaneous TID WC  . insulin NPH  30 Units Subcutaneous QAC breakfast  . isosorbide mononitrate  30 mg Oral Daily  . pantoprazole  40 mg Oral Daily  . potassium chloride SA  20 mEq Oral Daily  . Tamsulosin HCl  0.4 mg Oral Daily  . terbinafine   Topical Daily  . vancomycin  1,000 mg Intravenous Q12H  . vancomycin  1,000 mg Intravenous Q12H  . DISCONTD: aspirin  81 mg Oral Daily  . DISCONTD:  ceFAZolin (ANCEF) IV  2 g Intravenous 60 min Pre-Op  . DISCONTD: ceFEPime (MAXIPIME) IV  2 g Intravenous Q12H  . DISCONTD: insulin aspart  0-15 Units Subcutaneous TID WC   Continuous Infusions:   . sodium chloride      Principal Problem:  *Septic prepatellar bursitis of right knee Active Problems:   HYPERTENSION  Coronary atherosclerosis of autologous vein bypass graft  Chronic systolic heart failure  DM2 (diabetes mellitus, type 2)  Cellulitis    Time spent: > 35 minutes    Deyton Ellenbecker, [facility]  [facility] [employer]  [phone] If 8PM-8AM, please contact night-coverage at [email or web address], password [facility] 02/28/2012, 6:24 PM  LOS: 1 day

## 2012-02-28 NOTE — Progress Notes (Signed)
ANTIBIOTIC CONSULT NOTE - INITIAL  Pharmacy Consult for Vanco Indication: s/p right knee arthrocentesis and I&D for septic right knee bursitis  Allergies  Allergen Reactions  . Sildenafil Nausea Only and Other (See Comments)    "took it once; heart went to pieces; never took it again"  . Contrast Media (Iodinated Diagnostic Agents) Nausea Only    Patient Measurements:   Adjusted Body Weight:    Vital Signs: Temp: 98.1 F (36.7 C) (07/24 1451) Temp src: Oral (07/24 1451) BP: 105/57 mmHg (07/24 1451) Pulse Rate: 84  (07/24 1451) Intake/Output from previous day: 07/23 0701 - 07/24 0700 In: 540 [P.O.:240; I.V.:300] Out: 550 [Urine:550] Intake/Output from this shift: Total I/O In: 480 [P.O.:480] Out: -   Labs:  Basename 02/28/12 0538  WBC 11.4*  HGB 10.9*  PLT 261  LABCREA --  CREATININE 1.13   The CrCl is unknown because both a height and weight (above a minimum accepted value) are required for this calculation. No results found for this basename: VANCOTROUGH:2,VANCOPEAK:2,VANCORANDOM:2,GENTTROUGH:2,GENTPEAK:2,GENTRANDOM:2,TOBRATROUGH:2,TOBRAPEAK:2,TOBRARND:2,AMIKACINPEAK:2,AMIKACINTROU:2,AMIKACIN:2, in the last 72 hours   Microbiology: Recent Results (from the past 720 hour(s))  CULTURE, ROUTINE-ABSCESS     Status: Normal (Preliminary result)   Collection Time   02/27/12  5:20 PM      Component Value Range Status Comment   Specimen Description ABSCESS KNEE RIGHT   Final    Special Requests NONE   Final    Gram Stain PENDING   Incomplete    Culture NO GROWTH 1 DAY   Final    Report Status PENDING   Incomplete   ANAEROBIC CULTURE     Status: Normal (Preliminary result)   Collection Time   02/27/12  5:20 PM      Component Value Range Status Comment   Specimen Description ABSCESS KNEE RIGHT   Final    Special Requests NONE   Final    Gram Stain     Final    Value: NO WBC SEEN     NO SQUAMOUS EPITHELIAL CELLS SEEN     NO ORGANISMS SEEN   Culture     Final    Value: NO ANAEROBES ISOLATED; CULTURE IN PROGRESS FOR 5 DAYS   Report Status PENDING   Incomplete   BODY FLUID CULTURE     Status: Normal (Preliminary result)   Collection Time   02/27/12  5:27 PM      Component Value Range Status Comment   Specimen Description SYNOVIAL FLUID KNEE RIGHT   Final    Special Requests ASPIRATION   Final    Gram Stain     Final    Value: FEW WBC PRESENT,BOTH PMN AND MONONUCLEAR     RARE GRAM POSITIVE COCCI IN PAIRS   Culture NO GROWTH 1 DAY   Final    Report Status PENDING   Incomplete     Medical History: Past Medical History  Diagnosis Date  . CORONARY ARTERY DISEASE 03/11/2007    s/p CABG 1998;  admitted 08/2011 with an inferior STEMI.  LHC 09/07/11: Severe  Three-vessel CAD, LIMA-LAD patent, SVG-RCA chronically occluded, SVG-circumflex chronically occluded, SVG-diagonal 95%.  PCI: Promus DES to the SVG-diagonal.  Procedure c/b transient CHB req CPR and TTVP;  myoview 4/13:large severe inferolateral infarct, no ischemia, EF 25%    . DIABETES MELLITUS, TYPE I 03/11/2007  . HYPERTENSION 03/11/2007  . Proliferative diabetic retinopathy 03/09/2009  . HYPERCHOLESTEROLEMIA 09/28/2008  . CERVICAL RADICULOPATHY, LEFT 09/23/2008  . ANEMIA-IRON DEFICIENCY 09/23/2008  . PERIPHERAL NEUROPATHY 09/23/2008  . Chronic  systolic heart failure 09/23/2008    Echocardiogram 09/07/11: Difficult study, inferior and posterior lateral wall hypokinesis, mild LVH, EF 45%, mild LAE.   [name] RENAL INSUFFICIENCY 03/15/2010  . PERIPHERAL VASCULAR INSUFFICIENCY,  LEGS, BILATERAL 08/17/2010    ABI's performed 04/19/11 R 0.72 L 0.71  . ED (erectile dysfunction)   . Hypogonadism male   . GI bleed 2012    Multiple Duodenal ulcer   . Obesity   . Legally blind     bilaterally  . Stroke 62    age 3 months; residual right sided weakness  . History of blood transfusion 2012  . Charcot foot due to diabetes mellitus     chronic pain  . Osteoarthritis     right hip and shoulder  . Myocardial infarction       Medications:  Prescriptions prior to admission  Medication Sig Dispense Refill  . albuterol (PROVENTIL HFA;VENTOLIN HFA) 108 (90 BASE) MCG/ACT inhaler Inhale 2 puffs into the lungs every 4 (four) hours as needed. For shortness of breath      . amoxicillin-clavulanate (AUGMENTIN) 875-125 MG per tablet Take 1 tablet by mouth 2 (two) times daily. Started 7/19      . aspirin 81 MG tablet Take 81 mg by mouth daily.      . carvedilol (COREG) 6.25 MG tablet Take 6.25 mg by mouth 2 (two) times daily with a meal.      . clopidogrel (PLAVIX) 75 MG tablet Take 75 mg by mouth daily with breakfast.      . Docusate Sodium (STOOL SOFTENER) 100 MG capsule Take 100 mg by mouth daily as needed. constipation      . Fluticasone-Salmeterol (ADVAIR DISKUS) 100-50 MCG/DOSE AEPB Inhale 1 puff into the lungs 2 (two) times daily.  1 each  11  . furosemide (LASIX) 80 MG tablet Take 80-160 mg by mouth 2 (two) times daily. May take an extra 80 mg as needed      . gabapentin (NEURONTIN) 300 MG capsule Take 1 capsule (300 mg total) by mouth 3 (three) times daily.      [name] HYDROcodone-acetaminophen (NORCO) 10-325 MG per tablet Take 1-2 tablets by mouth every 4 (four) hours as needed. For pain  120 tablet  5  . insulin NPH (HUMULIN N,NOVOLIN N) 100 UNIT/ML injection Inject 55 Units into the skin daily before breakfast.       . isosorbide mononitrate (IMDUR) 30 MG 24 hr tablet Take 30 mg by mouth daily.      [name] losartan (COZAAR) 50 MG tablet Take 50 mg by mouth daily.      . nitroGLYCERIN (NITROSTAT) 0.4 MG SL tablet Place 1 tablet (0.4 mg total) under the tongue every 5 (five) minutes as needed. For chest pain  25 tablet  3  . NOVOLOG FLEXPEN 100 UNIT/ML injection Inject 20 Units into the skin 2 (two) times daily as needed. Sliding scale      . pantoprazole (PROTONIX) 40 MG tablet Take 40 mg by mouth daily.      . potassium chloride SA (K-DUR,KLOR-CON) 20 MEQ tablet Take 20 mEq by mouth daily.      . Probiotic Product  (PROBIOTIC PO) Take 1 capsule by mouth daily.      . rosuvastatin (CRESTOR) 20 MG tablet Take 20 mg by mouth daily.      [name] spironolactone (ALDACTONE) 25 MG tablet Take 12.5 mg by mouth daily.      . Tamsulosin HCl (FLOMAX) 0.4 MG CAPS Take 0.4 mg  by mouth daily.       Assessment: s/p right knee arthrocentesis and I&D for septic right knee bursitis  PMH: CAD s/p CABG, DM, HTN, diabetic retinopathy, HLD, anemia, peripheral neuropathy, CHF, CKD, PVD, GIB, CVA, blind, OA  ID: Cefepime D#2 for knee infxn - Afebrile, WBC 11.4. Plan to start Vancomycin today for Right knee fluid gram stain positive for rare gram positive cocci in pairs. Scr 1.13 and estimated CrCl 70.  Goal of Therapy:  Vancomycin trough level 10-15 mcg/ml  Plan:  Vanco 1g IV q12h Trough after 3-5 doses at steady state.  Misty Stanley Stillinger 02/28/2012,6:08 PM

## 2012-02-28 NOTE — Op Note (Signed)
Mark French, Mark French NO.:  [id number]  MEDICAL RECORD NO.:  [id number]  LOCATION:  [id number]                        FACILITY:  [facility]  PHYSICIAN:  Almedia Balls. Ranell Patrick, M.D. DATE OF BIRTH:  October 06, 1945  DATE OF PROCEDURE:  02/27/2012 DATE OF DISCHARGE:                              OPERATIVE REPORT   PREOPERATIVE DIAGNOSIS:  Right knee prepatellar septic bursitis.  POSTOPERATIVE DIAGNOSIS:  Right knee prepatellar septic bursitis with no evidence of septic arthritis.  PROCEDURE PERFORMED: 1. Right knee arthrocentesis. 2. Right knee I and D of infected prepatellar bursa, with packing.  ATTENDING SURGEON:  Almedia Balls. Ranell Patrick, MD.  ASSISTANT:  None.  ANESTHESIA:  General anesthesia was used.  ESTIMATED BLOOD LOSS:  Maybe 50 mL.  FLUID REPLACEMENT:  1000 mL of crystalloid  INSTRUMENT COUNTS:  Correct.  COMPLICATIONS:  No complications.  Perioperative antibiotics were given.  The patient had been on Augmentin and was given IV vancomycin after the cultures were obtained.  INDICATIONS:  The patient is a 66 year old diabetic with a history of banging his knee last week with his powered wheelchair on a doorjamb. The patient had immediate pain and swelling, was seen in Orthopedics, was noted to have contusion for which he was placed on p.o. Augmentin. He followed up this week with Jodene Nam PA-C in the office and was noted to have redness, swelling, consistent with an infected prepatellar bursa.  The patient was immediately sent to the hospital for admission and I and D surgically, followed by antibiotics.  Informed consent obtained.  DESCRIPTION OF PROCEDURE:  After an adequate level of anesthesia was achieved, the patient was positioned in the supine position on the operating room table.  The right leg correctly identified.  Sterile prep and drape was performed.  A time-out was called.  Before making a skin incision in a pristine area, when I aspirated the knee  I obtained about 6 mL of normal-looking fluid sent that off for Gram stain, cell count with diff.  The next thing we did was we made a midline incision directly overlying the bursa, immediate purulent material came out, we cultured that and then performed sharp I and D of the prepatellar bursa by irrigating with 3 L of pulsatile irrigation with 3 L of normal saline.  We then packed it with a moistened normal saline soaked Kerlix gauze and then a sterile compressive bandage around that time. The patient was transported to recovery room in stable condition.  We will be following cultures closely and we will call Infectious Disease consult.     Almedia Balls. Ranell Patrick, M.D.     SRN/MEDQ  D:  02/27/2012  T:  02/28/2012  Job:  [id number]

## 2012-02-28 NOTE — Evaluation (Signed)
Physical Therapy Evaluation Patient Details Name: Mark French MRN: [id number] DOB: 1946/04/16 Today's Date: 02/28/2012 Time: 1308-6578 PT Time Calculation (min): 24 min  PT Assessment / Plan / Recommendation Clinical Impression  Pt adm with infected bursa rt. knee.  Pt underwent I&D.  Pt with limited amb at home prior to admission using an electric scooter.  Pt feels with his set-up and equipment at home that he will be able to return to baseline level of functioning.    PT Assessment       Follow Up Recommendations  Home health PT    Barriers to Discharge        Equipment Recommendations  None recommended by PT    Recommendations for Other Services     Frequency Min 3X/week    Precautions / Restrictions Precautions Precautions: Fall   Pertinent Vitals/Pain N/A      Mobility  Bed Mobility Bed Mobility: Supine to Sit;Sitting - Scoot to Edge of Bed Supine to Sit: 3: Mod assist;HOB elevated Sitting - Scoot to Edge of Bed: 5: Supervision Details for Bed Mobility Assistance: Assist to bring trunk up.  Pt sleeps in recliner at home. Transfers Transfers: Sit to Stand;Stand to Sit Sit to Stand: 4: Min assist;With upper extremity assist;From bed Stand to Sit: 4: Min assist;With upper extremity assist;To chair/3-in-1 Details for Transfer Assistance: verbal cues for hand placement Ambulation/Gait Ambulation/Gait Assistance: 4: Min assist Ambulation Distance (Feet): 15 Feet Assistive device: Rolling walker Ambulation/Gait Assistance Details: Cues for pt to keep feet inside walker Gait Pattern: Trunk flexed;Decreased step length - left;Right flexed knee in stance    Exercises     PT Diagnosis: Difficulty walking;Generalized weakness  PT Problem List: Decreased strength;Decreased activity tolerance;Decreased balance;Decreased mobility PT Treatment Interventions: DME instruction;Gait training;Functional mobility training;Therapeutic activities;Therapeutic exercise;Balance  training;Patient/family education   PT Goals Acute Rehab PT Goals PT Goal Formulation: With patient Time For Goal Achievement: 03/06/12 Potential to Achieve Goals: Good Pt will go Sit to Stand: with supervision PT Goal: Sit to Stand - Progress: Goal set today Pt will go Stand to Sit: with supervision PT Goal: Stand to Sit - Progress: Goal set today Pt will Transfer Bed to Chair/Chair to Bed: with supervision PT Transfer Goal: Bed to Chair/Chair to Bed - Progress: Goal set today Pt will Ambulate: 16 - 50 feet;with supervision;with least restrictive assistive device PT Goal: Ambulate - Progress: Goal set today  Visit Information  Last PT Received On: 02/28/12 Assistance Needed: +1    Subjective Data  Subjective: "I had a stroke when I was 2 months old." Patient Stated Goal: Return home   Prior Functioning  Home Living Lives With: Spouse Available Help at Discharge: Family;Available PRN/intermittently Type of Home: House Home Access: Ramped entrance Home Layout: One level Bathroom Shower/Tub: Engineer, manufacturing systems: Standard Home Adaptive Equipment: Shower chair without back;Wheelchair - powered;Walker - rolling;Quad cane Prior Function Level of Independence: Independent with assistive device(s) Vocation: Retired Musician: No difficulties    Cognition  Overall Cognitive Status: Appears within functional limits for tasks assessed/performed Arousal/Alertness: Awake/alert Orientation Level: Appears intact for tasks assessed Behavior During Session: [facility] for tasks performed    Extremity/Trunk Assessment Right Lower Extremity Assessment RLE ROM/Strength/Tone: Deficits RLE ROM/Strength/Tone Deficits: grossly 3/5 Left Lower Extremity Assessment LLE ROM/Strength/Tone: Deficits LLE ROM/Strength/Tone Deficits: grossly 4/5   Balance Static Standing Balance Static Standing - Balance Support: Bilateral upper extremity supported (on walker) Static  Standing - Level of Assistance: 5: Stand by assistance  End of Session  PT - End of Session Activity Tolerance: Patient tolerated treatment well Patient left: in chair;with call bell/phone within reach Nurse Communication: Mobility status  GP     [facility] 02/28/2012, 2:20 PM  [facility] PT [phone]

## 2012-02-28 NOTE — Progress Notes (Signed)
[facility]  Total days of antibiotics 6        Day 2 Cefepime        Day 1 Vancomycin              Reason for Consult: Treatment of septic bursitis of right knee    Referring Physician: Dr. Malon Kindle  Principal Problem:  *Septic prepatellar bursitis of right knee Active Problems:  HYPERTENSION  Coronary atherosclerosis of autologous vein bypass graft  Chronic systolic heart failure  DM2 (diabetes mellitus, type 2)  Cellulitis     . aspirin EC  81 mg Oral Daily  . atorvastatin  20 mg Oral q1800  . carvedilol  6.25 mg Oral BID WC  .  ceFAZolin (ANCEF) IV  2 g Intravenous 60 min Pre-Op  . ceFEPime (MAXIPIME) IV  2 g Intravenous Q12H  . chlorhexidine  60 mL Topical Once  . clopidogrel  75 mg Oral Q breakfast  . Fluticasone-Salmeterol  1 puff Inhalation BID  . furosemide  40 mg Oral BID  . gabapentin  300 mg Oral TID  . HYDROcodone-acetaminophen  1 tablet Oral To PACU  . insulin aspart  0-15 Units Subcutaneous TID WC  . insulin aspart  0-5 Units Subcutaneous QHS  . insulin aspart  4 Units Subcutaneous TID WC  . insulin NPH  30 Units Subcutaneous QAC breakfast  . isosorbide mononitrate  30 mg Oral Daily  . pantoprazole  40 mg Oral Daily  . potassium chloride SA  20 mEq Oral Daily  . Tamsulosin HCl  0.4 mg Oral Daily  . terbinafine   Topical Daily  . vancomycin  1,000 mg Intravenous Q12H  . DISCONTD: aspirin  81 mg Oral Daily  . DISCONTD: insulin aspart  0-15 Units Subcutaneous TID WC   Recommendations: 1. D/c Cefepime and Ancef 2. restart Vancomycin as per pharmacy protocol 3. F/u blood and body fluid culture   Assessment: Mark French is s/p day #2 right knee arthrocentesis and I&D of infected prepatellar bursa.  Right knee fluid gram stain positive for rare gram positive cocci in pairs so far.  We will d/c Cefepime and Ancef and restart Vancomycin at this time pending cultures and will continue to follow.  HPI: Mark French is a 66  y.o. male with extensive past medical history who presented to his orthopedic office yesterday for continued inflammation of right knee s/p trauma to knee one week prior.  He was started on Ampicillin on July 19th, 2013 for 5 days and returned to the office for follow up yesterday 02/27/12.  There appeared to be worsening inflammation and pain of right knee at that time and he was brought to the hospital for arthrocentesis and I&D.  Today is post op day #2, and Mark French was seen and examined at bedside.  He claims to be much better since admission.  He has post op right knee pain, but controlled with medication.  He denies fever, chills, n/v/d, chest pain, shortness of breath, or abdominal pain at this time.  He has a good appetite and is looking forward to eating his dinner.   Review of Systems: Pertinent items are noted in HPI.  Past Medical History  Diagnosis Date  . CORONARY ARTERY DISEASE 03/11/2007    s/p CABG 1998;  admitted 08/2011 with an inferior STEMI.  LHC 09/07/11: Severe  Three-vessel CAD, LIMA-LAD patent, SVG-RCA chronically occluded, SVG-circumflex chronically occluded, SVG-diagonal 95%.  PCI: Promus DES to the  SVG-diagonal.  Procedure c/b transient CHB req CPR and TTVP;  myoview 4/13:large severe inferolateral infarct, no ischemia, EF 25%    . DIABETES MELLITUS, TYPE I 03/11/2007  . HYPERTENSION 03/11/2007  . Proliferative diabetic retinopathy 03/09/2009  . HYPERCHOLESTEROLEMIA 09/28/2008  . CERVICAL RADICULOPATHY, LEFT 09/23/2008  . ANEMIA-IRON DEFICIENCY 09/23/2008  . PERIPHERAL NEUROPATHY 09/23/2008  . Chronic systolic heart failure 09/23/2008    Echocardiogram 09/07/11: Difficult study, inferior and posterior lateral wall hypokinesis, mild LVH, EF 45%, mild LAE.   [name] RENAL INSUFFICIENCY 03/15/2010  . PERIPHERAL VASCULAR INSUFFICIENCY,  LEGS, BILATERAL 08/17/2010    ABI's performed 04/19/11 R 0.72 L 0.71  . ED (erectile dysfunction)   . Hypogonadism male   . GI bleed 2012    Multiple Duodenal  ulcer   . Obesity   . Legally blind     bilaterally  . Stroke 56    age 13 months; residual right sided weakness  . History of blood transfusion 2012  . Charcot foot due to diabetes mellitus     chronic pain  . Osteoarthritis     right hip and shoulder  . Myocardial infarction     History  Substance Use Topics  . Smoking status: Never Smoker   . Smokeless tobacco: Never Used  . Alcohol Use: No    Family History  Problem Relation Age of Onset  . Heart disease Mother     Coronary Artery Disease  . Heart disease Father     Coronary Artery Disease  . Heart disease Paternal Grandmother     Coronary Artery Disease  . Colon cancer Neg Hx   . Diabetes Other     Grandmother   Allergies  Allergen Reactions  . Sildenafil Nausea Only and Other (See Comments)    "took it once; heart went to pieces; never took it again"  . Contrast Media (Iodinated Diagnostic Agents) Nausea Only   OBJECTIVE: Blood pressure 105/57, pulse 84, temperature 98.1 F (36.7 C), temperature source Oral, resp. rate 18, SpO2 94.00%. General: AAOX3, painful distress due to right knee pain Skin: warm, +discolaration of b/l lower extremities Lungs: CTA B/L Cor: + sternotomy scar, RRR Abdomen: +bs, obese, non tender, soft Extremities: right knee bandage in place, warm to touch b/l lower extremities. +2 dp b/l  Microbiology: Recent Results (from the past 240 hour(s))  CULTURE, ROUTINE-ABSCESS     Status: Normal (Preliminary result)   Collection Time   02/27/12  5:20 PM      Component Value Range Status Comment   Specimen Description ABSCESS KNEE RIGHT   Final    Special Requests NONE   Final    Gram Stain PENDING   Incomplete    Culture NO GROWTH 1 DAY   Final    Report Status PENDING   Incomplete   ANAEROBIC CULTURE     Status: Normal (Preliminary result)   Collection Time   02/27/12  5:20 PM      Component Value Range Status Comment   Specimen Description ABSCESS KNEE RIGHT   Final    Special  Requests NONE   Final    Gram Stain     Final    Value: NO WBC SEEN     NO SQUAMOUS EPITHELIAL CELLS SEEN     NO ORGANISMS SEEN   Culture     Final    Value: NO ANAEROBES ISOLATED; CULTURE IN PROGRESS FOR 5 DAYS   Report Status PENDING   Incomplete   BODY FLUID CULTURE  Status: Normal (Preliminary result)   Collection Time   02/27/12  5:27 PM      Component Value Range Status Comment   Specimen Description SYNOVIAL FLUID KNEE RIGHT   Final    Special Requests ASPIRATION   Final    Gram Stain     Final    Value: FEW WBC PRESENT,BOTH PMN AND MONONUCLEAR     RARE GRAM POSITIVE COCCI IN PAIRS   Culture NO GROWTH 1 DAY   Final    Report Status PENDING   Incomplete    Darden Palmer, MD PGY-1 IM Resident [place] [phone] pager   02/28/2012, 5:31 PM  Addendum: I have seen and examined Mark French and discussed his case with Dr. Virgina Organ. I agree with her assessment and recommendations. Based on his Gram stain it appears that he probably had a streptococcal or staphylococcal right prepatellar septic bursitis. I agree with IV vancomycin pending cultures.  Cliffton Asters, MD [facility] [phone] pager   [phone] cell 02/28/2012, 6:19 PM

## 2012-02-29 DIAGNOSIS — A499 Bacterial infection, unspecified: Secondary | ICD-10-CM

## 2012-02-29 DIAGNOSIS — K5903 Drug induced constipation: Secondary | ICD-10-CM | POA: Diagnosis not present

## 2012-02-29 DIAGNOSIS — I10 Essential (primary) hypertension: Secondary | ICD-10-CM

## 2012-02-29 DIAGNOSIS — K5909 Other constipation: Secondary | ICD-10-CM

## 2012-02-29 DIAGNOSIS — M7040 Prepatellar bursitis, unspecified knee: Principal | ICD-10-CM

## 2012-02-29 LAB — COMPREHENSIVE METABOLIC PANEL
AST: 27 U/L (ref 0–37)
Albumin: 2.6 g/dL — ABNORMAL LOW (ref 3.5–5.2)
BUN: 22 mg/dL (ref 6–23)
Calcium: 8.7 mg/dL (ref 8.4–10.5)
Creatinine, Ser: 1.22 mg/dL (ref 0.50–1.35)
Total Protein: 6.7 g/dL (ref 6.0–8.3)

## 2012-02-29 LAB — GLUCOSE, CAPILLARY
Glucose-Capillary: 177 mg/dL — ABNORMAL HIGH (ref 70–99)
Glucose-Capillary: 153 mg/dL — ABNORMAL HIGH (ref 70–99)
Glucose-Capillary: 124 mg/dL — ABNORMAL HIGH (ref 70–99)

## 2012-02-29 MED ORDER — DOCUSATE SODIUM 100 MG PO CAPS
100.0000 mg | ORAL_CAPSULE | Freq: Two times a day (BID) | ORAL | Status: DC
  Administered 2012-02-29 – 2012-03-01 (×2): 100 mg via ORAL
  Filled 2012-02-29 (×4): qty 1

## 2012-02-29 MED ORDER — VANCOMYCIN HCL IN DEXTROSE 1-5 GM/200ML-% IV SOLN: 1000.0000 mg | Freq: Two times a day (BID) | INTRAVENOUS | Status: DC

## 2012-02-29 MED ORDER — SODIUM CHLORIDE 0.9 % IJ SOLN
10.0000 mL | INTRAMUSCULAR | Status: DC | PRN
  Administered 2012-02-29 – 2012-03-01 (×2): 10 mL

## 2012-02-29 NOTE — Progress Notes (Signed)
Physical Therapy Treatment Patient Details Name: Mark French MRN: [id number] DOB: 09/01/1945 Today's Date: 02/29/2012 Time: 1120-1130 PT Time Calculation (min): 10 min  PT Assessment / Plan / Recommendation Comments on Treatment Session  Pt with increased RLE pain and refused ambulation. Will continue per plan    Follow Up Recommendations  Home health PT (pt may need SNF pending progress)    Barriers to Discharge        Equipment Recommendations  None recommended by PT    Recommendations for Other Services    Frequency Min 3X/week   Plan Discharge plan remains appropriate;Frequency remains appropriate    Precautions / Restrictions Precautions Precautions: Fall Restrictions Weight Bearing Restrictions: No       Mobility  Bed Mobility Bed Mobility: Not assessed Transfers Transfers: Sit to Stand;Stand to Sit Sit to Stand: 3: Mod assist;With upper extremity assist;From chair/3-in-1 Stand to Sit: 4: Min assist;With upper extremity assist;To chair/3-in-1 Details for Transfer Assistance: VC for hand placement as pt wated to stand up to RW. Assist for stability Ambulation/Gait Ambulation/Gait Assistance: 4: Min assist Ambulation Distance (Feet): 5 Feet Assistive device: Rolling walker Ambulation/Gait Assistance Details: Distance limited secondary to pain. VC for proper sequencing and safety with RW Gait Pattern: Trunk flexed;Decreased step length - left;Right flexed knee in stance Gait velocity: decreased gait speed     PT Goals Acute Rehab PT Goals PT Goal: Sit to Stand - Progress: Progressing toward goal PT Goal: Stand to Sit - Progress: Progressing toward goal PT Transfer Goal: Bed to Chair/Chair to Bed - Progress: Progressing toward goal PT Goal: Ambulate - Progress: Progressing toward goal  Visit Information  Last PT Received On: 02/29/12 Assistance Needed: +1    Subjective Data      Cognition  Overall Cognitive Status: Appears within functional limits for  tasks assessed/performed Arousal/Alertness: Awake/alert Orientation Level: Appears intact for tasks assessed Behavior During Session: [facility] for tasks performed    Balance     End of Session PT - End of Session Equipment Utilized During Treatment: Gait belt Activity Tolerance: Patient limited by pain Patient left: in chair;with call bell/phone within reach Nurse Communication: Mobility status     Milana Kidney 02/29/2012, 12:14 PM  02/29/2012 Milana Kidney DPT PAGER: [phone] OFFICE: [phone]

## 2012-02-29 NOTE — Care Management Note (Signed)
    Page 1 of 2   03/01/2012     10:09:27 AM   CARE MANAGEMENT NOTE 03/01/2012  Patient:  Mark French, Mark French   Account Number:  [id number]  Date Initiated:  02/29/2012  Documentation initiated by:  GRAVES-BIGELOW,Ereka Brau  Subjective/Objective Assessment:   Pt admitted with Septic prepatellar bursitis of right knee. I& D performed and treating with iv vancomycin.     Action/Plan:   Pt will need HHRN for dressing chages and HHPT for evaluation and treatment. MD please write order for above services. If pt to go home on IV ABX will need Rx for them.   Anticipated DC Date:  03/01/2012   Anticipated DC Plan:  HOME W HOME HEALTH SERVICES      DC Planning Services  CM consult      [facility] Choice  HOME HEALTH   Choice offered to / List presented to:  C-3 Spouse        HH arranged  HH-1 RN  HH-10 DISEASE MANAGEMENT  HH-2 PT      HH agency  [facility]   Status of service:  Completed, signed off Medicare Important Message given?   (If response is "NO", the following Medicare IM given date fields will be blank) Date Medicare IM given:   Date Additional Medicare IM given:    Discharge Disposition:  HOME W HOME HEALTH SERVICES  Per UR Regulation:  Reviewed for med. necessity/level of care/duration of stay  If discussed at Long Length of Stay Meetings, dates discussed:    Comments:  03-01-12 [address] Tomi Bamberger, RN,BSN [phone] CM will need orders for [facility] services listed above. CM mader referral for services to [facility]. SOC to begin within 24-48 hours post d/c. CM has asked RN to f/u with MD to see if pt will be d/c on IV vancomycin bc Rx will need to be obtained to be given to [facility] to supply and dispense. CM will continue to f/u for additional services.    02-29-12 1644 Tomi Bamberger, [place] [phone] CM will visit pt in am to discuss disposition needs due to was asleep.

## 2012-02-29 NOTE — Progress Notes (Signed)
UR Completed Korena Nass Graves-Bigelow, RN,BSN [phone]  

## 2012-02-29 NOTE — Progress Notes (Signed)
[facility] PROGRESS NOTE  Mark French YNW:[id number] DOB: 1946/08/06 DOA: 02/27/2012 PCP: [name], MD  Assessment/Plan: Principal Problem:  *Septic prepatellar bursitis of right knee Active Problems:  HYPERTENSION  Coronary atherosclerosis of autologous vein bypass graft  Chronic systolic heart failure  DM2 (diabetes mellitus, type 2)  Cellulitis Constipation 1. Septic prepatellar bursitis of right knee - Per ortho and ID.  Patient may need picc line and I have discussed with patient although I did mention that it would be up to the Infectious Disease doctor's decision.  2. Hypertension  - At this point would plan on continuing current regimen as blood pressures have been relatively well controlled. Last BP 103/45 - Pt is on carvedilol, isosorbide mononitrate   3. CAD s/p CABG with h/o Stemi  - Continue aspirin, plavix, imdur, coreg, statin   4. DM  - Blood sugars well controlled and have ranged from 114-177. Continue current regimen. Pt is on NPH at 30 units [place] qac at breakfast with SSI coverage   5. Chronic Systolic CHF/EF: Last echo 01/12/12. Mentioned that LVEF appeared normal with inferior wall appearing hypokinetic. Currently compensated. Would continue lasix at current dose and monitor.   6. Constipation - Likely related to side effect from pain medication.  Place patient on colace today.  Code Status: Full  Family Communication: Spoke with patient and family at bedside  Disposition Plan: Pending clinical improvement and recommendations from specialist    Brief narrative: Please refer to HPI  Consultants:  ID  Ortho  Procedures: IRRIGATION AND DEBRIDEMENT of right KNEE  Antibiotics:  Vancomycin  HPI/Subjective: Still feels constipated and straining while having BM's.  Otherwise no acute issues.  Did have some bleeding from his wound but stopped.  Denies any fever, chills, nausea, emesis, abdominal discomfort, or dysuria  Objective: Filed  Vitals:   02/28/12 2006 02/29/12 0000 02/29/12 0400 02/29/12 0800  BP:  119/69 114/51 103/45  Pulse: 76 69 69 66  Temp:  99.1 F (37.3 C) 98.4 F (36.9 C) 97.9 F (36.6 C)  TempSrc:      Resp: 18 18 18 18   Height:      Weight:      SpO2: 94% 100% 100% 98%    Intake/Output Summary (Last 24 hours) at 02/29/12 0957 Last data filed at 02/29/12 0800  Gross per 24 hour  Intake    600 ml  Output      0 ml  Net    600 ml    Exam:   General:  Pt in NAD, A and O x 3  Cardiovascular: RRR, no MRG  Respiratory: CTA BL, No wheezes  Abdomen: Soft, NT, ND  Extremities: Guaze over Right knee there is some blood at upper part of guaze but has stopped and there is no more active bleeding. No cellulitis noted over upper thigh.  Data Reviewed: Basic Metabolic Panel:  Lab 02/29/12 6578 02/28/12 0538  NA 131* 132*  K 4.2 4.3  CL 92* 93*  CO2 31 27  GLUCOSE 122* 126*  BUN 22 23  CREATININE 1.22 1.13  CALCIUM 8.7 9.0  MG -- --  PHOS -- --   Liver Function Tests:  Lab 02/29/12 0500 02/28/12 0538  AST 27 28  ALT 29 25  ALKPHOS 60 63  BILITOT 0.3 0.4  PROT 6.7 6.9  ALBUMIN 2.6* 2.8*   No results found for this basename: LIPASE:5,AMYLASE:5 in the last 168 hours No results found for this basename: AMMONIA:5 in the last  168 hours CBC:  Lab 02/28/12 0538  WBC 11.4*  NEUTROABS --  HGB 10.9*  HCT 34.2*  MCV 84.0  PLT 261   Cardiac Enzymes: No results found for this basename: CKTOTAL:5,CKMB:5,CKMBINDEX:5,TROPONINI:5 in the last 168 hours BNP (last 3 results)  Basename 01/30/12 1619 01/16/12 1635 12/31/11 2333  PROBNP 207.0* 519.1* 676.3*   CBG:  Lab 02/29/12 0730 02/28/12 2145 02/28/12 1547 02/28/12 1143 02/28/12 0735  GLUCAP 177* 131* 163* 114* 114*    Recent Results (from the past 240 hour(s))  CULTURE, ROUTINE-ABSCESS     Status: Normal (Preliminary result)   Collection Time   02/27/12  5:20 PM      Component Value Range Status Comment   Specimen Description  ABSCESS KNEE RIGHT   Final    Special Requests NONE   Final    Gram Stain PENDING   Incomplete    Culture     Final    Value: MODERATE STAPHYLOCOCCUS AUREUS     Note: RIFAMPIN AND GENTAMICIN SHOULD NOT BE USED AS SINGLE DRUGS FOR TREATMENT OF STAPH INFECTIONS.   Report Status PENDING   Incomplete   ANAEROBIC CULTURE     Status: Normal (Preliminary result)   Collection Time   02/27/12  5:20 PM      Component Value Range Status Comment   Specimen Description ABSCESS KNEE RIGHT   Final    Special Requests NONE   Final    Gram Stain     Final    Value: NO WBC SEEN     NO SQUAMOUS EPITHELIAL CELLS SEEN     NO ORGANISMS SEEN   Culture     Final    Value: NO ANAEROBES ISOLATED; CULTURE IN PROGRESS FOR 5 DAYS   Report Status PENDING   Incomplete   BODY FLUID CULTURE     Status: Normal (Preliminary result)   Collection Time   02/27/12  5:27 PM      Component Value Range Status Comment   Specimen Description SYNOVIAL FLUID KNEE RIGHT   Final    Special Requests ASPIRATION   Final    Gram Stain     Final    Value: FEW WBC PRESENT,BOTH PMN AND MONONUCLEAR     RARE GRAM POSITIVE COCCI IN PAIRS   Culture NO GROWTH 1 DAY   Final    Report Status PENDING   Incomplete      Studies: No results found.  Scheduled Meds:   . aspirin EC  81 mg Oral Daily  . atorvastatin  20 mg Oral q1800  . carvedilol  6.25 mg Oral BID WC  . chlorhexidine  60 mL Topical Once  . clopidogrel  75 mg Oral Q breakfast  . Fluticasone-Salmeterol  1 puff Inhalation BID  . furosemide  40 mg Oral BID  . gabapentin  300 mg Oral TID  . HYDROcodone-acetaminophen  1 tablet Oral To PACU  . insulin aspart  0-15 Units Subcutaneous TID WC  . insulin aspart  0-5 Units Subcutaneous QHS  . insulin aspart  4 Units Subcutaneous TID WC  . insulin NPH  30 Units Subcutaneous QAC breakfast  . isosorbide mononitrate  30 mg Oral Daily  . pantoprazole  40 mg Oral Daily  . potassium chloride SA  20 mEq Oral Daily  . Tamsulosin HCl   0.4 mg Oral Daily  . terbinafine   Topical Daily  . vancomycin  1,000 mg Intravenous Q12H  . DISCONTD:  ceFAZolin (ANCEF) IV  2 g Intravenous  60 min Pre-Op  . DISCONTD: ceFEPime (MAXIPIME) IV  2 g Intravenous Q12H   Continuous Infusions:   . sodium chloride      Principal Problem:  *Septic prepatellar bursitis of right knee Active Problems:  HYPERTENSION  Coronary atherosclerosis of autologous vein bypass graft  Chronic systolic heart failure  DM2 (diabetes mellitus, type 2)  Cellulitis    Time spent: > 35 minutes    Penny Pia  [facility] Pager [phone]. If 8PM-8AM, please contact night-coverage at [email or web address], password [facility] 02/29/2012, 9:57 AM  LOS: 2 days

## 2012-02-29 NOTE — Progress Notes (Signed)
Peripherally Inserted Central Catheter/Midline Placement  The IV Nurse has discussed with the patient and/or persons authorized to consent for the patient, the purpose of this procedure and the potential benefits and risks involved with this procedure.  The benefits include less needle sticks, lab draws from the catheter and patient may be discharged home with the catheter.  Risks include, but not limited to, infection, bleeding, blood clot (thrombus formation), and puncture of an artery; nerve damage and irregular heat beat.  Alternatives to this procedure were also discussed.  PICC/Midline Placement Documentation        Mark French 02/29/2012, 1:35 PM

## 2012-02-29 NOTE — Progress Notes (Signed)
[facility]    Date of Admission:  02/27/2012   Total days of antibiotics 7        Day 2 Vancomycin         Principal Problem:  *Septic prepatellar bursitis of right knee Active Problems:  HYPERTENSION  Coronary atherosclerosis of autologous vein bypass graft  Chronic systolic heart failure  DM2 (diabetes mellitus, type 2)  Cellulitis  Constipation due to pain medication     . aspirin EC  81 mg Oral Daily  . atorvastatin  20 mg Oral q1800  . carvedilol  6.25 mg Oral BID WC  . chlorhexidine  60 mL Topical Once  . clopidogrel  75 mg Oral Q breakfast  . docusate sodium  100 mg Oral BID  . Fluticasone-Salmeterol  1 puff Inhalation BID  . furosemide  40 mg Oral BID  . gabapentin  300 mg Oral TID  . HYDROcodone-acetaminophen  1 tablet Oral To PACU  . insulin aspart  0-15 Units Subcutaneous TID WC  . insulin aspart  0-5 Units Subcutaneous QHS  . insulin aspart  4 Units Subcutaneous TID WC  . insulin NPH  30 Units Subcutaneous QAC breakfast  . isosorbide mononitrate  30 mg Oral Daily  . pantoprazole  40 mg Oral Daily  . potassium chloride SA  20 mEq Oral Daily  . Tamsulosin HCl  0.4 mg Oral Daily  . terbinafine   Topical Daily  . vancomycin  1,000 mg Intravenous Q12H  . DISCONTD:  ceFAZolin (ANCEF) IV  2 g Intravenous 60 min Pre-Op  . DISCONTD: ceFEPime (MAXIPIME) IV  2 g Intravenous Q12H   Subjective: Mr. Gribble was seen and examined in chair today.  He claims to be feeling better since admission and yesterday with better pain control today.  He is concerned about his knee since after dressing change this morning, it has been bleeding through.  The dressing will be changed again this morning by his nurse.  Otherwise, he denies any fever, chills, N/V/D, headaches, chest pain, or shortness of breath.  He claims to not have a BM since prior to this admission.    Objective: Temp:  [97.9 F (36.6 C)-99.1 F (37.3 C)] 97.9 F (36.6 C) (07/25  0800) Pulse Rate:  [66-84] 66  (07/25 0800) Resp:  [18] 18  (07/25 0800) BP: (103-119)/(45-69) 103/45 mmHg (07/25 0800) SpO2:  [94 %-100 %] 98 % (07/25 0800) Weight:  [223 lb 15.8 oz (101.6 kg)] 223 lb 15.8 oz (101.6 kg) (07/24 1700)  General: AAOX3, sitting comfortably in chair Skin: warm + b/l lower extremity discoloration Lungs: CTA B/L Cor: RRR, + sternotomy scar Abdomen: +BS, obese, NT, soft Extremities: + right knee dressing in place, blood visible on top of dressing, SCD on LLE, +2dp b/l  Lab Results Lab Results  Component Value Date   WBC 11.4* 02/28/2012   HGB 10.9* 02/28/2012   HCT 34.2* 02/28/2012   MCV 84.0 02/28/2012   PLT 261 02/28/2012    Lab Results  Component Value Date   CREATININE 1.22 02/29/2012   BUN 22 02/29/2012   NA 131* 02/29/2012   K 4.2 02/29/2012   CL 92* 02/29/2012   CO2 31 02/29/2012    Lab Results  Component Value Date   ALT 29 02/29/2012   AST 27 02/29/2012   ALKPHOS 60 02/29/2012   BILITOT 0.3 02/29/2012     Microbiology: Recent Results (from the past 240 hour(s))  CULTURE, ROUTINE-ABSCESS  Status: Normal (Preliminary result)   Collection Time   02/27/12  5:20 PM      Component Value Range Status Comment   Specimen Description ABSCESS KNEE RIGHT   Final    Special Requests NONE   Final    Gram Stain PENDING   Incomplete    Culture     Final    Value: MODERATE STAPHYLOCOCCUS AUREUS     Note: RIFAMPIN AND GENTAMICIN SHOULD NOT BE USED AS SINGLE DRUGS FOR TREATMENT OF STAPH INFECTIONS.   Report Status PENDING   Incomplete   ANAEROBIC CULTURE     Status: Normal (Preliminary result)   Collection Time   02/27/12  5:20 PM      Component Value Range Status Comment   Specimen Description ABSCESS KNEE RIGHT   Final    Special Requests NONE   Final    Gram Stain     Final    Value: NO WBC SEEN     NO SQUAMOUS EPITHELIAL CELLS SEEN     NO ORGANISMS SEEN   Culture     Final    Value: NO ANAEROBES ISOLATED; CULTURE IN PROGRESS FOR 5 DAYS   Report  Status PENDING   Incomplete   BODY FLUID CULTURE     Status: Normal (Preliminary result)   Collection Time   02/27/12  5:27 PM      Component Value Range Status Comment   Specimen Description SYNOVIAL FLUID KNEE RIGHT   Final    Special Requests ASPIRATION   Final    Gram Stain     Final    Value: FEW WBC PRESENT,BOTH PMN AND MONONUCLEAR     RARE GRAM POSITIVE COCCI IN PAIRS   Culture NO GROWTH 1 DAY   Final    Report Status PENDING   Incomplete    Assessment: Mr. Waln is POD# 2 s/p right knee arthrocentesis and I&D being treated with Vancomycin for suspected Staph related septic bursitis.  Right knee abscess culture from 7/23 positive for moderate Staphylococcus Aureus, official results still pending.  Right knee synovial fluid culture pending with gram stain positive for rare gram positive cocci in pairs.  I spoke to the micro lab tech in regards to the synovial fluid culture who reports no growth on culture plates as of today as well.  Based on Mr. Degroat improved clinical course, s/p drainage with no evidence of septic arthritis, and remaining afebrile, he may be a candidate for conversion to oral therapy in the next 24-48 hours.  As such, we do not recommend PICC placement at this time, and would like to continue IV Vancomycin at least overnight pending cultures and susceptibility information.  We will re-evaluate tomorrow morning and possibly switch or oral abx at that time based on clinical presentation and culture information.        Plan: Continue IV Vancomycin today  Hold off on PICC placement for possible conversion to oral therapy tomorrow Follow up official cultures  Darden Palmer, MD PGY-1 IM Resident Eligha Bridegroom. [facility]  [phone] pager    02/29/2012, 11:38 AM  Addendum: Mr. Boateng is improving. I talked to the laboratory this morning because of the initial report that the Gram stain of synovial fluid was showing gram-positive cocci in pairs. It turns out that  specimen was mislabeled. His synovial fluid Gram stain and cultures are negative and [name] reported no evidence of septic arthritis at the time of his operation. The positive Gram stain was from  his pre-patella bursa her specimen and that culture is growing staph aureus. In about susceptibility should be available in the morning. If he continues to do well I will consider transitioning him to oral antibiotic therapy tomorrow and treating for at least 2 weeks.  Cliffton Asters, MD [facility] [phone] pager   [phone] cell 02/29/2012, 2:34 PM

## 2012-02-29 NOTE — Discharge Summary (Signed)
Physician Discharge Summary  Patient ID: Mark French MRN: [id number] DOB/AGE: [birth year] 66 y.o.  Admit date: 02/27/2012 Discharge date: 03/01/2012  Admission Diagnoses:  Principal Problem:  *Septic prepatellar bursitis of right knee Active Problems:  HYPERTENSION  Coronary atherosclerosis of autologous vein bypass graft  Chronic systolic heart failure  DM2 (diabetes mellitus, type 2)  Cellulitis   Discharge Diagnoses:  Same   Surgeries: Procedure(s): IRRIGATION AND DEBRIDEMENT KNEE on 02/27/2012   Consultants: Infectious disease, Internal medicine  Discharged Condition: Stable  Hospital Course: Mark French is an 66 y.o. male who was admitted 02/27/2012 with a chief complaint of No chief complaint on file. , and found to have a diagnosis of Septic prepatellar bursitis of right knee.  They were brought to the operating room on 02/27/2012 and underwent the above named procedures.    The patient had an uncomplicated hospital course and was stable for discharge.  Recent vital signs:  Filed Vitals:   02/29/12 0800  BP: 103/45  Pulse: 66  Temp: 97.9 F (36.6 C)  Resp: 18    Recent laboratory studies:  Results for orders placed during the hospital encounter of 02/27/12  GLUCOSE, CAPILLARY      Component Value Range   Glucose-Capillary 107 (*) 70 - 99 mg/dL  GLUCOSE, CAPILLARY      Component Value Range   Glucose-Capillary 108 (*) 70 - 99 mg/dL   Comment 1 Notify RN    BODY FLUID CULTURE      Component Value Range   Specimen Description SYNOVIAL FLUID KNEE RIGHT     Special Requests ASPIRATION     Gram Stain       Value: FEW WBC PRESENT,BOTH PMN AND MONONUCLEAR     RARE GRAM POSITIVE COCCI IN PAIRS   Culture NO GROWTH 1 DAY     Report Status PENDING    CULTURE, ROUTINE-ABSCESS      Component Value Range   Specimen Description ABSCESS KNEE RIGHT     Special Requests NONE     Gram Stain PENDING     Culture       Value: MODERATE STAPHYLOCOCCUS AUREUS   Note: RIFAMPIN AND GENTAMICIN SHOULD NOT BE USED AS SINGLE DRUGS FOR TREATMENT OF STAPH INFECTIONS.   Report Status PENDING    ANAEROBIC CULTURE      Component Value Range   Specimen Description ABSCESS KNEE RIGHT     Special Requests NONE     Gram Stain       Value: NO WBC SEEN     NO SQUAMOUS EPITHELIAL CELLS SEEN     NO ORGANISMS SEEN   Culture       Value: NO ANAEROBES ISOLATED; CULTURE IN PROGRESS FOR 5 DAYS   Report Status PENDING    CBC      Component Value Range   WBC 11.4 (*) 4.0 - 10.5 K/uL   RBC 4.07 (*) 4.22 - 5.81 MIL/uL   Hemoglobin 10.9 (*) 13.0 - 17.0 g/dL   HCT 40.9 (*) 81.1 - 91.4 %   MCV 84.0  78.0 - 100.0 fL   MCH 26.8  26.0 - 34.0 pg   MCHC 31.9  30.0 - 36.0 g/dL   RDW 78.2  95.6 - 21.3 %   Platelets 261  150 - 400 K/uL  COMPREHENSIVE METABOLIC PANEL      Component Value Range   Sodium 132 (*) 135 - 145 mEq/L   Potassium 4.3  3.5 - 5.1 mEq/L   Chloride 93 (*)  96 - 112 mEq/L   CO2 27  19 - 32 mEq/L   Glucose, Bld 126 (*) 70 - 99 mg/dL   BUN 23  6 - 23 mg/dL   Creatinine, Ser 1.61  0.50 - 1.35 mg/dL   Calcium 9.0  8.4 - 09.6 mg/dL   Total Protein 6.9  6.0 - 8.3 g/dL   Albumin 2.8 (*) 3.5 - 5.2 g/dL   AST 28  0 - 37 U/L   ALT 25  0 - 53 U/L   Alkaline Phosphatase 63  39 - 117 U/L   Total Bilirubin 0.4  0.3 - 1.2 mg/dL   GFR calc non Af Amer 65 (*) >90 mL/min   GFR calc Af Amer 75 (*) >90 mL/min  GLUCOSE, CAPILLARY      Component Value Range   Glucose-Capillary 98  70 - 99 mg/dL   Comment 1 Notify RN    GLUCOSE, CAPILLARY      Component Value Range   Glucose-Capillary 114 (*) 70 - 99 mg/dL   Comment 1 Documented in Chart    GLUCOSE, CAPILLARY      Component Value Range   Glucose-Capillary 114 (*) 70 - 99 mg/dL  GLUCOSE, CAPILLARY      Component Value Range   Glucose-Capillary 163 (*) 70 - 99 mg/dL   Comment 1 Notify RN    COMPREHENSIVE METABOLIC PANEL      Component Value Range   Sodium 131 (*) 135 - 145 mEq/L   Potassium 4.2  3.5 - 5.1  mEq/L   Chloride 92 (*) 96 - 112 mEq/L   CO2 31  19 - 32 mEq/L   Glucose, Bld 122 (*) 70 - 99 mg/dL   BUN 22  6 - 23 mg/dL   Creatinine, Ser 0.45  0.50 - 1.35 mg/dL   Calcium 8.7  8.4 - 40.9 mg/dL   Total Protein 6.7  6.0 - 8.3 g/dL   Albumin 2.6 (*) 3.5 - 5.2 g/dL   AST 27  0 - 37 U/L   ALT 29  0 - 53 U/L   Alkaline Phosphatase 60  39 - 117 U/L   Total Bilirubin 0.3  0.3 - 1.2 mg/dL   GFR calc non Af Amer 59 (*) >90 mL/min   GFR calc Af Amer 69 (*) >90 mL/min  GLUCOSE, CAPILLARY      Component Value Range   Glucose-Capillary 131 (*) 70 - 99 mg/dL  GLUCOSE, CAPILLARY      Component Value Range   Glucose-Capillary 177 (*) 70 - 99 mg/dL    Discharge Medications:   Medication List  As of 02/29/2012  9:42 AM   TAKE these medications         albuterol 108 (90 BASE) MCG/ACT inhaler   Commonly known as: PROVENTIL HFA;VENTOLIN HFA   Inhale 2 puffs into the lungs every 4 (four) hours as needed. For shortness of breath      amoxicillin-clavulanate 875-125 MG per tablet   Commonly known as: AUGMENTIN   Take 1 tablet by mouth 2 (two) times daily. Started 7/19      aspirin 81 MG tablet   Take 81 mg by mouth daily.      carvedilol 6.25 MG tablet   Commonly known as: COREG   Take 6.25 mg by mouth 2 (two) times daily with a meal.      clopidogrel 75 MG tablet   Commonly known as: PLAVIX   Take 75 mg by mouth daily with breakfast.  Fluticasone-Salmeterol 100-50 MCG/DOSE Aepb   Commonly known as: ADVAIR   Inhale 1 puff into the lungs 2 (two) times daily.      furosemide 80 MG tablet   Commonly known as: LASIX   Take 80-160 mg by mouth 2 (two) times daily. May take an extra 80 mg as needed      gabapentin 300 MG capsule   Commonly known as: NEURONTIN   Take 1 capsule (300 mg total) by mouth 3 (three) times daily.      HYDROcodone-acetaminophen 10-325 MG per tablet   Commonly known as: NORCO   Take 1-2 tablets by mouth every 4 (four) hours as needed. For pain      insulin  NPH 100 UNIT/ML injection   Commonly known as: HUMULIN N,NOVOLIN N   Inject 55 Units into the skin daily before breakfast.      isosorbide mononitrate 30 MG 24 hr tablet   Commonly known as: IMDUR   Take 30 mg by mouth daily.      losartan 50 MG tablet   Commonly known as: COZAAR   Take 50 mg by mouth daily.      nitroGLYCERIN 0.4 MG SL tablet   Commonly known as: NITROSTAT   Place 1 tablet (0.4 mg total) under the tongue every 5 (five) minutes as needed. For chest pain      NOVOLOG FLEXPEN 100 UNIT/ML injection   Generic drug: insulin aspart   Inject 20 Units into the skin 2 (two) times daily as needed. Sliding scale      pantoprazole 40 MG tablet   Commonly known as: PROTONIX   Take 40 mg by mouth daily.      potassium chloride SA 20 MEQ tablet   Commonly known as: K-DUR,KLOR-CON   Take 20 mEq by mouth daily.      PROBIOTIC PO   Take 1 capsule by mouth daily.      rosuvastatin 20 MG tablet   Commonly known as: CRESTOR   Take 20 mg by mouth daily.      spironolactone 25 MG tablet   Commonly known as: ALDACTONE   Take 12.5 mg by mouth daily.      STOOL SOFTENER 100 MG capsule   Generic drug: Docusate Sodium   Take 100 mg by mouth daily as needed. constipation      Tamsulosin HCl 0.4 MG Caps   Commonly known as: FLOMAX   Take 0.4 mg by mouth daily.      vancomycin 1 GM/200ML Soln   Commonly known as: VANCOCIN   Inject 200 mLs (1,000 mg total) into the vein every 12 (twelve) hours.            Diagnostic Studies: No results found.  Disposition: 01-Home or Self Care  Discharge Orders    Future Appointments: Provider: Department: Dept Phone: Center:   03/15/2012 1:00 PM [facility] [phone] None   03/15/2012 2:00 PM Kalman Shan, MD [facility] [phone] None   04/23/2012 3:30 PM Tonny Bollman, MD [facility] [phone] [facility]     Future Orders Please Complete By Expires   Diet - low sodium heart healthy       Call MD / Call 911      Comments:   If you experience chest pain or shortness of breath, CALL 911 and be transported to the hospital emergency room.  If you develope a fever above 101 F, pus (white drainage) or increased drainage or redness at the  wound, or calf pain, call your surgeon's office.   Constipation Prevention      Comments:   Drink plenty of fluids.  Prune juice may be helpful.  You may use a stool softener, such as Colace (over the counter) 100 mg twice a day.  Use MiraLax (over the counter) for constipation as needed.   Increase activity slowly as tolerated         Follow-up Information    Follow up with NORRIS,STEVEN R, MD. Schedule an appointment as soon as possible for a visit in 1 week. [phone])    Contact information:   [facility] [address] [place] [address] [phone]           Signed: Thea Gist 02/29/2012, 9:42 AM

## 2012-02-29 NOTE — Progress Notes (Signed)
Orthopedics Progress Note  Subjective: I am feeling better, but overdid the spirometer.  Objective:  Filed Vitals:   02/29/12 0400  BP: 114/51  Pulse: 69  Temp: 98.4 F (36.9 C)  Resp: 18    General: Awake and alert  Musculoskeletal: R knee wound looking better. Min erythema. Dressing changed, moist to dry normal saline Neurovascularly intact  Lab Results  Component Value Date   WBC 11.4* 02/28/2012   HGB 10.9* 02/28/2012   HCT 34.2* 02/28/2012   MCV 84.0 02/28/2012   PLT 261 02/28/2012       Component Value Date/Time   NA 132* 02/28/2012 0538   K 4.3 02/28/2012 0538   CL 93* 02/28/2012 0538   CO2 27 02/28/2012 0538   GLUCOSE 126* 02/28/2012 0538   BUN 23 02/28/2012 0538   CREATININE 1.13 02/28/2012 0538   CALCIUM 9.0 02/28/2012 0538   CALCIUM 8.7 12/15/2010 1645   GFRNONAA 65* 02/28/2012 0538   GFRAA 75* 02/28/2012 0538    Lab Results  Component Value Date   INR 0.98 01/11/2012   INR 0.95 09/07/2011   INR 1.07 08/22/2011    Assessment/Plan: POD #2 s/p Procedure(s): IRRIGATION AND DEBRIDEMENT KNEE PICC line. Anticipate IV antibioitics. On Vanc. Final recommendations per ID, thanks [employer] D/C for tomorrow. Will need home health RN for wound care(moist to dry dressing changes)  Almedia Balls. Ranell Patrick, MD 02/29/2012 6:26 AM

## 2012-03-01 DIAGNOSIS — B9689 Other specified bacterial agents as the cause of diseases classified elsewhere: Secondary | ICD-10-CM

## 2012-03-01 DIAGNOSIS — I5023 Acute on chronic systolic (congestive) heart failure: Secondary | ICD-10-CM

## 2012-03-01 DIAGNOSIS — E7800 Pure hypercholesterolemia, unspecified: Secondary | ICD-10-CM

## 2012-03-01 DIAGNOSIS — M7040 Prepatellar bursitis, unspecified knee: Secondary | ICD-10-CM

## 2012-03-01 LAB — COMPREHENSIVE METABOLIC PANEL
AST: 28 U/L (ref 0–37)
Albumin: 2.5 g/dL — ABNORMAL LOW (ref 3.5–5.2)
Alkaline Phosphatase: 61 U/L (ref 39–117)
Chloride: 95 mEq/L — ABNORMAL LOW (ref 96–112)
Potassium: 4.3 mEq/L (ref 3.5–5.1)
Total Bilirubin: 0.2 mg/dL — ABNORMAL LOW (ref 0.3–1.2)

## 2012-03-01 LAB — CBC
MCH: 26.8 pg (ref 26.0–34.0)
MCHC: 31.5 g/dL (ref 30.0–36.0)
MCV: 85.1 fL (ref 78.0–100.0)
Platelets: 226 10*3/uL (ref 150–400)
RDW: 15.2 % (ref 11.5–15.5)

## 2012-03-01 LAB — GLUCOSE, CAPILLARY
Glucose-Capillary: 185 mg/dL — ABNORMAL HIGH (ref 70–99)
Glucose-Capillary: 128 mg/dL — ABNORMAL HIGH (ref 70–99)

## 2012-03-01 LAB — CULTURE, ROUTINE-ABSCESS

## 2012-03-01 MED ORDER — VANCOMYCIN HCL 1000 MG IV SOLR: 1000.0000 mg | Freq: Two times a day (BID) | INTRAVENOUS | Status: DC

## 2012-03-01 MED ORDER — METHOCARBAMOL 500 MG PO TABS: 500.0000 mg | ORAL_TABLET | Freq: Three times a day (TID) | ORAL | Status: DC

## 2012-03-01 MED ORDER — CEPHALEXIN 500 MG PO CAPS: 500.0000 mg | ORAL_CAPSULE | Freq: Four times a day (QID) | ORAL | Status: DC

## 2012-03-01 MED ORDER — HYDROCODONE-ACETAMINOPHEN 10-325 MG PO TABS: 1.0000 | ORAL_TABLET | Freq: Four times a day (QID) | ORAL | Status: AC | PRN

## 2012-03-01 MED ORDER — DEXTROSE 5 % IV SOLN: 2.0000 g | INTRAVENOUS | Status: DC

## 2012-03-01 NOTE — Progress Notes (Signed)
Pt and pt wife provided with d/c instrcutions and education. Pt and pt wife verbalize understanding. [facility]HRN[facility] Per ID (Dr. Orvan Falconer), IV team nurse has been notified to d/c PICC line. Pt will be d/c on keflex PO. Pt and pt wife provided with education on new medications and educated on when/how to take them. Heart monitor cleaned and returned to front. Pt and pt wife have no questions at this time. Pt awaiting PICC removal prior to d/c home. Will continue to monitor until patient leaves floor. Ramond Craver, RN

## 2012-03-01 NOTE — Progress Notes (Signed)
Patient discharging home with IV Vancomycin despite culture being MSSA for Septic bursititis of knee. Called Dr. Orvan Falconer with ID as consulted on patient. Aware of culture and had planned to change to oral antibiotics. Patient with PICC line. Dr. Orvan Falconer to change order with [facility] to Ancef for MSSA culture.   Link Snuffer, PharmD, BCPS Clinical Pharmacist [phone] 03/01/2012, 1:04 PM

## 2012-03-01 NOTE — Progress Notes (Signed)
Physical Therapy Treatment Patient Details Name: Mark French MRN: [id number] DOB: 1946/03/27 Today's Date: 03/01/2012 Time: 4098-1191 PT Time Calculation (min): 24 min  PT Assessment / Plan / Recommendation Comments on Treatment Session  Pt with RLE I&D who is progressing with mobility and ambulation. Pt encouraged to continue ambulation and HEP. Pt states pain in knee is 10/10 before and after mobility but willing to mobilize and no gross distress with movement.     Follow Up Recommendations  Home health PT    Barriers to Discharge        Equipment Recommendations       Recommendations for Other Services    Frequency     Plan Discharge plan remains appropriate;Frequency remains appropriate    Precautions / Restrictions Precautions Precautions: Fall   Pertinent Vitals/Pain 10/10 pain before and after activity, premedicated and meds received after tx    Mobility  Bed Mobility Bed Mobility: Not assessed Transfers Transfers: Sit to Stand;Stand to Sit Sit to Stand: 5: Supervision;From chair/3-in-1;With armrests Stand to Sit: 5: Supervision;To chair/3-in-1;With armrests Details for Transfer Assistance: supervision for safety and increased time Ambulation/Gait Ambulation/Gait Assistance: 4: Min guard Ambulation Distance (Feet): 36 Feet Assistive device: Rolling walker Ambulation/Gait Assistance Details: cueing to step into RW and extend trunk  Gait Pattern: Trunk flexed;Decreased stride length Gait velocity: decreased    Exercises General Exercises - Lower Extremity Long Arc Quad: AROM;Right;Seated;20 reps Hip Flexion/Marching: AROM;20 reps;Seated;Right   PT Diagnosis:    PT Problem List:   PT Treatment Interventions:     PT Goals Acute Rehab PT Goals PT Goal: Sit to Stand - Progress: Met PT Goal: Stand to Sit - Progress: Met PT Goal: Ambulate - Progress: Progressing toward goal  Visit Information  Last PT Received On: 03/01/12 Assistance Needed: +1      Subjective Data  Subjective: I need to get another pain pill   Cognition  Overall Cognitive Status: Appears within functional limits for tasks assessed/performed Arousal/Alertness: Awake/alert Orientation Level: Appears intact for tasks assessed Behavior During Session: [facility] for tasks performed    Balance     End of Session PT - End of Session Equipment Utilized During Treatment: Gait belt Activity Tolerance: Patient tolerated treatment well Patient left: in chair;with call bell/phone within reach Nurse Communication: Mobility status   GP     Delorse Lek 03/01/2012, 3:55 PM Delaney Meigs, PT [phone]

## 2012-03-01 NOTE — Progress Notes (Signed)
    Subjective: Procedure(s) (LRB): IRRIGATION AND DEBRIDEMENT KNEE (Right) 3 Days Post-Op  Patient reports pain as 5 on 0-10 scale.  Reports unchanged leg pain Positive void Positive bowel movement Positive flatus Negative chest pain or shortness of breath  Objective: Vital signs in last 24 hours: Temp:  [97.8 F (36.6 C)-98.6 F (37 C)] 98.2 F (36.8 C) (07/26 0800) Pulse Rate:  [60-95] 95  (07/26 1004) Resp:  [18-20] 18  (07/26 0800) BP: (96-113)/(52-68) 109/68 mmHg (07/26 1004) SpO2:  [95 %-100 %] 95 % (07/26 1004)  Intake/Output from previous day: 07/25 0701 - 07/26 0700 In: 1080 [P.O.:1080] Out: 500 [Urine:500]  Labs:  [facility] 03/01/12 0557 02/28/12 0538  WBC 8.5 11.4*  RBC 3.88* 4.07*  HCT 33.0* 34.2*  PLT 226 261    Basename 03/01/12 0557 02/29/12 0500  NA 134* 131*  K 4.3 4.2  CL 95* 92*  CO2 33* 31  BUN 22 22  CREATININE 1.13 1.22  GLUCOSE 173* 122*  CALCIUM 8.7 8.7   No results found for this basename: LABPT:2,INR:2 in the last 72 hours  Physical Exam: Neurologically intact ABD soft wound clean.  no purulent drainage  Assessment/Plan: Patient stable  Continue mobilization with physical therapy Continue care  Discharge home with home health Wound care and IV abx set-up with [facility]  Venita Lick, MD [facility] [phone]

## 2012-03-01 NOTE — Progress Notes (Addendum)
Patient ID: Mark French, male   DOB: Nov 19, 1945, 66 y.o.   MRN: [id number]    [facility]    Date of Admission:  02/27/2012   Total days of antibiotics 8        Day 3 vancomycin         Principal Problem:  *Septic prepatellar bursitis of right knee Active Problems:  HYPERTENSION  Coronary atherosclerosis of autologous vein bypass graft  Chronic systolic heart failure  DM2 (diabetes mellitus, type 2)  Cellulitis  Constipation due to pain medication      . aspirin EC  81 mg Oral Daily  . atorvastatin  20 mg Oral q1800  . carvedilol  6.25 mg Oral BID WC  . chlorhexidine  60 mL Topical Once  . clopidogrel  75 mg Oral Q breakfast  . docusate sodium  100 mg Oral BID  . Fluticasone-Salmeterol  1 puff Inhalation BID  . furosemide  40 mg Oral BID  . gabapentin  300 mg Oral TID  . insulin aspart  0-15 Units Subcutaneous TID WC  . insulin aspart  0-5 Units Subcutaneous QHS  . insulin aspart  4 Units Subcutaneous TID WC  . insulin NPH  30 Units Subcutaneous QAC breakfast  . isosorbide mononitrate  30 mg Oral Daily  . pantoprazole  40 mg Oral Daily  . potassium chloride SA  20 mEq Oral Daily  . Tamsulosin HCl  0.4 mg Oral Daily  . terbinafine   Topical Daily  . vancomycin  1,000 mg Intravenous Q12H    Objective: Temp:  [97.8 F (36.6 C)-98.6 F (37 C)] 98.2 F (36.8 C) (07/26 0800) Pulse Rate:  [60-95] 95  (07/26 1004) Resp:  [18-20] 18  (07/26 0800) BP: (96-113)/(52-68) 109/68 mmHg (07/26 1004) SpO2:  [95 %-100 %] 95 % (07/26 1004)  Lab Results Lab Results  Component Value Date   WBC 8.5 03/01/2012   HGB 10.4* 03/01/2012   HCT 33.0* 03/01/2012   MCV 85.1 03/01/2012   PLT 226 03/01/2012    Lab Results  Component Value Date   CREATININE 1.13 03/01/2012   BUN 22 03/01/2012   NA 134* 03/01/2012   K 4.3 03/01/2012   CL 95* 03/01/2012   CO2 33* 03/01/2012    Lab Results  Component Value Date   ALT 37 03/01/2012   AST 28 03/01/2012   ALKPHOS 61  03/01/2012   BILITOT 0.2* 03/01/2012      Microbiology: Recent Results (from the past 240 hour(s))  CULTURE, ROUTINE-ABSCESS     Status: Normal   Collection Time   02/27/12  5:20 PM      Component Value Range Status Comment   Specimen Description ABSCESS KNEE RIGHT   Final    Special Requests NONE   Final    Gram Stain     Final    Value: FEW WBC PRESENT, PREDOMINANTLY PMN     NO SQUAMOUS EPITHELIAL CELLS SEEN     RARE GRAM POSITIVE COCCI     IN PAIRS   Culture     Final    Value: MODERATE STAPHYLOCOCCUS AUREUS     Note: RIFAMPIN AND GENTAMICIN SHOULD NOT BE USED AS SINGLE DRUGS FOR TREATMENT OF STAPH INFECTIONS.   Report Status 03/01/2012 FINAL   Final    Organism ID, Bacteria STAPHYLOCOCCUS AUREUS   Final   ANAEROBIC CULTURE     Status: Normal (Preliminary result)   Collection Time   02/27/12  5:20 PM  Component Value Range Status Comment   Specimen Description ABSCESS KNEE RIGHT   Final    Special Requests NONE   Final    Gram Stain     Final    Value: NO WBC SEEN     NO SQUAMOUS EPITHELIAL CELLS SEEN     NO ORGANISMS SEEN   Culture     Final    Value: NO ANAEROBES ISOLATED; CULTURE IN PROGRESS FOR 5 DAYS   Report Status PENDING   Incomplete   BODY FLUID CULTURE     Status: Normal (Preliminary result)   Collection Time   02/27/12  5:27 PM      Component Value Range Status Comment   Specimen Description SYNOVIAL FLUID KNEE RIGHT   Final    Special Requests ASPIRATION   Final    Gram Stain     Final    Value: NO WBC SEEN     NO ORGANISMS SEEN     CORRECTED RESULTS CALLED TO: DR Tylisa Alcivar 12:30PM BY MILSH PREVIOUSLY REPORTED AS FEW WBC RARE GRAM POSITIVE COCCI IN PAIRS   Culture NO GROWTH 3 DAYS   Final    Report Status PENDING   Incomplete     Assessment: I note the plan is to discharge him home on IV antibiotics. However, I would prefer to use ceftriaxone and vancomycin as beta-lactam antibiotics will be more active against his MSSA. I would plan on 2 weeks of  therapy.  Plan: 1. Change vancomycin to ceftriaxone 2 g IV daily x2 more weeks 2. Please call if I can be of further assistance  Cliffton Asters, MD [facility] for Infectious Disease [facility] [phone] pager   [phone] cell 03/01/2012, 1:32 PM  Addendum: I have spoken with his nurse, Darlin Drop from [facility] and Dr. Shon Baton. Apparently Mr. Prasad is in the Medicare doughnut hole and cannot afford outpatient IV medication. Now that his had incision and drainage of his septic bursae uncomfortable convert him over to oral therapy so I will discontinue the order for IV ceftriaxone, have his PICC removed and final treatment with oral cephalexin 500 mg orally 4 times daily for at least 2 more weeks.  Cliffton Asters, MD [facility] for Infectious Disease [facility] [phone] pager   [phone] cell 03/01/2012, 3:38 PM

## 2012-03-01 NOTE — Progress Notes (Addendum)
[facility] PROGRESS NOTE  Mark French ZOX:[id number] DOB: 09-19-1945 DOA: 02/27/2012 PCP: [name], MD  Assessment/Plan: Principal Problem:  *Septic prepatellar bursitis of right knee Active Problems:  HYPERTENSION  Coronary atherosclerosis of autologous vein bypass graft  Chronic systolic heart failure  DM2 (diabetes mellitus, type 2)  Cellulitis  Constipation due to pain medication 1. Septic prepatellar bursitis of right knee - Per ortho and ID. Pt looking at discharge soon and pending results of pending susceptibilities. - Awaiting further recommendations from ID for antibiotic regimen.  If patient can obtain adequate antibiotic coverage with oral agents then we can d/c without picc line.  Order will need to be placed prior to d/c. - Will order home health PT  2. Hypertension  - At this point would plan on continuing current regimen as blood pressures have been relatively well controlled. Last BP 109/68 - Pt is on carvedilol, isosorbide mononitrate and would continue as outpatient.  3. CAD s/p CABG with h/o Stemi  - Continue aspirin, plavix, imdur, coreg, statin   4. DM  - Blood sugars relatively well controlled.  Continue current regimen. Pt is on NPH at 30 units [place] qac at breakfast.  Would plan on discharging on home regimen.  5. Chronic Systolic CHF/EF: Last echo 01/12/12. Mentioned that LVEF appeared normal with inferior wall appearing hypokinetic. Currently compensated. Would continue lasix at current dose and monitor. Could discharge on current lasix dose.  6. Constipation  - Likely related to side effect from pain medication.  - Improved with colace and can be discharge on this medication.  Code Status: Full  Family Communication: Spoke with patient and family at bedside  Disposition Plan: Pending clinical improvement and recommendations from specialist   Brief narrative:  Please refer to HPI   Consultants:  ID  Ortho   Procedures:  IRRIGATION  AND DEBRIDEMENT of right KNEE  Antibiotics:  Vancomycin    HPI/Subjective: Patient had a minor nose bleed which has subsided.  No other new complaints this morning.  No acute issues reported overnight.  Denies any fever, chills, HA's, Abdominal discomfort, nausea, emesis  Objective: Filed Vitals:   03/01/12 0000 03/01/12 0400 03/01/12 0800 03/01/12 1004  BP: 96/52 113/63 107/67 109/68  Pulse: 62 60 68 95  Temp: 98.2 F (36.8 C) 97.8 F (36.6 C) 98.2 F (36.8 C)   TempSrc:      Resp: 18 18 18    Height:      Weight:      SpO2: 98% 100% 96% 95%    Intake/Output Summary (Last 24 hours) at 03/01/12 1035 Last data filed at 03/01/12 0900  Gross per 24 hour  Intake   1080 ml  Output   1500 ml  Net   -420 ml    Exam:  General: Pt in NAD, A and O x 3  Cardiovascular: RRR, no MRG  Respiratory: CTA BL, No wheezes  Abdomen: Soft, NT, ND  Extremities: Guaze over Right knee. No cellulitis or rashes seen on visual examination  Data Reviewed: Basic Metabolic Panel:  Lab 03/01/12 8119 02/29/12 0500 02/28/12 0538  NA 134* 131* 132*  K 4.3 4.2 4.3  CL 95* 92* 93*  CO2 33* 31 27  GLUCOSE 173* 122* 126*  BUN 22 22 23   CREATININE 1.13 1.22 1.13  CALCIUM 8.7 8.7 9.0  MG -- -- --  PHOS -- -- --   Liver Function Tests:  Lab 03/01/12 0557 02/29/12 0500 02/28/12 0538  AST 28 27 28  ALT 37 29 25  ALKPHOS 61 60 63  BILITOT 0.2* 0.3 0.4  PROT 6.5 6.7 6.9  ALBUMIN 2.5* 2.6* 2.8*   No results found for this basename: LIPASE:5,AMYLASE:5 in the last 168 hours No results found for this basename: AMMONIA:5 in the last 168 hours CBC:  Lab 03/01/12 0557 02/28/12 0538  WBC 8.5 11.4*  NEUTROABS -- --  HGB 10.4* 10.9*  HCT 33.0* 34.2*  MCV 85.1 84.0  PLT 226 261   Cardiac Enzymes: No results found for this basename: CKTOTAL:5,CKMB:5,CKMBINDEX:5,TROPONINI:5 in the last 168 hours BNP (last 3 results)  Basename 01/30/12 1619 01/16/12 1635 12/31/11 2333  PROBNP 207.0* 519.1*  676.3*   CBG:  Lab 03/01/12 0742 02/29/12 2002 02/29/12 1644 02/29/12 1133 02/29/12 0730  GLUCAP 176* 168* 124* 153* 177*    Recent Results (from the past 240 hour(s))  CULTURE, ROUTINE-ABSCESS     Status: Normal   Collection Time   02/27/12  5:20 PM      Component Value Range Status Comment   Specimen Description ABSCESS KNEE RIGHT   Final    Special Requests NONE   Final    Gram Stain     Final    Value: FEW WBC PRESENT, PREDOMINANTLY PMN     NO SQUAMOUS EPITHELIAL CELLS SEEN     RARE GRAM POSITIVE COCCI     IN PAIRS   Culture     Final    Value: MODERATE STAPHYLOCOCCUS AUREUS     Note: RIFAMPIN AND GENTAMICIN SHOULD NOT BE USED AS SINGLE DRUGS FOR TREATMENT OF STAPH INFECTIONS.   Report Status 03/01/2012 FINAL   Final    Organism ID, Bacteria STAPHYLOCOCCUS AUREUS   Final   ANAEROBIC CULTURE     Status: Normal (Preliminary result)   Collection Time   02/27/12  5:20 PM      Component Value Range Status Comment   Specimen Description ABSCESS KNEE RIGHT   Final    Special Requests NONE   Final    Gram Stain     Final    Value: NO WBC SEEN     NO SQUAMOUS EPITHELIAL CELLS SEEN     NO ORGANISMS SEEN   Culture     Final    Value: NO ANAEROBES ISOLATED; CULTURE IN PROGRESS FOR 5 DAYS   Report Status PENDING   Incomplete   BODY FLUID CULTURE     Status: Normal (Preliminary result)   Collection Time   02/27/12  5:27 PM      Component Value Range Status Comment   Specimen Description SYNOVIAL FLUID KNEE RIGHT   Final    Special Requests ASPIRATION   Final    Gram Stain     Final    Value: NO WBC SEEN     NO ORGANISMS SEEN     CORRECTED RESULTS CALLED TO: DR CAMPBELL 12:30PM BY MILSH PREVIOUSLY REPORTED AS FEW WBC RARE GRAM POSITIVE COCCI IN PAIRS   Culture NO GROWTH 2 DAYS   Final    Report Status PENDING   Incomplete      Studies: No results found.  Scheduled Meds:   . aspirin EC  81 mg Oral Daily  . atorvastatin  20 mg Oral q1800  . carvedilol  6.25 mg Oral BID WC    . chlorhexidine  60 mL Topical Once  . clopidogrel  75 mg Oral Q breakfast  . docusate sodium  100 mg Oral BID  . Fluticasone-Salmeterol  1 puff Inhalation BID  .  furosemide  40 mg Oral BID  . gabapentin  300 mg Oral TID  . insulin aspart  0-15 Units Subcutaneous TID WC  . insulin aspart  0-5 Units Subcutaneous QHS  . insulin aspart  4 Units Subcutaneous TID WC  . insulin NPH  30 Units Subcutaneous QAC breakfast  . isosorbide mononitrate  30 mg Oral Daily  . pantoprazole  40 mg Oral Daily  . potassium chloride SA  20 mEq Oral Daily  . Tamsulosin HCl  0.4 mg Oral Daily  . terbinafine   Topical Daily  . vancomycin  1,000 mg Intravenous Q12H   Continuous Infusions:   . sodium chloride      Principal Problem:  *Septic prepatellar bursitis of right knee Active Problems:  HYPERTENSION  Coronary atherosclerosis of autologous vein bypass graft  Chronic systolic heart failure  DM2 (diabetes mellitus, type 2)  Cellulitis  Constipation due to pain medication    Time spent: > 35 minutes    Penny Pia  [facility] Pager [phone] If 8PM-8AM, please contact night-coverage at [email or web address], password [facility] 03/01/2012, 10:35 AM  LOS: 3 days      Addendum:  Will go ahead and order home health PT

## 2012-03-01 NOTE — Progress Notes (Signed)
PICC line removed by IV team nurse per order. Pt left unit without s/s of distress. No needs at this time. Ramond Craver, RN

## 2012-03-02 LAB — BODY FLUID CULTURE
Culture: NO GROWTH
Gram Stain: NONE SEEN

## 2012-03-03 LAB — ANAEROBIC CULTURE: Gram Stain: NONE SEEN

## 2012-03-04 ENCOUNTER — Encounter (HOSPITAL_COMMUNITY): Payer: Self-pay

## 2012-03-04 ENCOUNTER — Emergency Department (HOSPITAL_COMMUNITY)
Admission: EM | Admit: 2012-03-04 | Discharge: 2012-03-04 | Disposition: A | Discharge status: Home / Self Care | Payer: Medicare Other | Attending: Emergency Medicine | Admitting: Emergency Medicine

## 2012-03-04 DIAGNOSIS — M159 Polyosteoarthritis, unspecified: Secondary | ICD-10-CM | POA: Insufficient documentation

## 2012-03-04 DIAGNOSIS — T148XXA Other injury of unspecified body region, initial encounter: Secondary | ICD-10-CM

## 2012-03-04 DIAGNOSIS — I2510 Atherosclerotic heart disease of native coronary artery without angina pectoris: Secondary | ICD-10-CM | POA: Insufficient documentation

## 2012-03-04 DIAGNOSIS — Z23 Encounter for immunization: Secondary | ICD-10-CM | POA: Insufficient documentation

## 2012-03-04 DIAGNOSIS — E109 Type 1 diabetes mellitus without complications: Secondary | ICD-10-CM | POA: Insufficient documentation

## 2012-03-04 DIAGNOSIS — IMO0002 Reserved for concepts with insufficient information to code with codable children: Secondary | ICD-10-CM | POA: Insufficient documentation

## 2012-03-04 DIAGNOSIS — E7800 Pure hypercholesterolemia, unspecified: Secondary | ICD-10-CM | POA: Insufficient documentation

## 2012-03-04 DIAGNOSIS — I252 Old myocardial infarction: Secondary | ICD-10-CM | POA: Insufficient documentation

## 2012-03-04 DIAGNOSIS — I5022 Chronic systolic (congestive) heart failure: Secondary | ICD-10-CM | POA: Insufficient documentation

## 2012-03-04 DIAGNOSIS — I10 Essential (primary) hypertension: Secondary | ICD-10-CM | POA: Insufficient documentation

## 2012-03-04 DIAGNOSIS — Z8673 Personal history of transient ischemic attack (TIA), and cerebral infarction without residual deficits: Secondary | ICD-10-CM | POA: Insufficient documentation

## 2012-03-04 DIAGNOSIS — Z951 Presence of aortocoronary bypass graft: Secondary | ICD-10-CM | POA: Insufficient documentation

## 2012-03-04 MED ORDER — BACITRACIN 500 UNIT/GM EX OINT
1.0000 "application " | TOPICAL_OINTMENT | Freq: Two times a day (BID) | CUTANEOUS | Status: DC
  Administered 2012-03-04: 1 via TOPICAL
  Filled 2012-03-04 (×2): qty 0.9

## 2012-03-04 MED ORDER — TETANUS-DIPHTH-ACELL PERTUSSIS 5-2.5-18.5 LF-MCG/0.5 IM SUSP
0.5000 mL | Freq: Once | INTRAMUSCULAR | Status: AC
  Administered 2012-03-04: 0.5 mL via INTRAMUSCULAR
  Filled 2012-03-04: qty 0.5
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
## 2012-03-15 ENCOUNTER — Ambulatory Visit (INDEPENDENT_AMBULATORY_CARE_PROVIDER_SITE_OTHER): Payer: Medicare Other | Admitting: Internal Medicine

## 2012-03-15 ENCOUNTER — Encounter: Payer: Self-pay | Admitting: Internal Medicine

## 2012-03-15 VITALS — BP 94/52 | HR 58 | Temp 97.1°F | Ht 66.0 in | Wt 230.0 lb

## 2012-03-15 DIAGNOSIS — R0609 Other forms of dyspnea: Secondary | ICD-10-CM

## 2012-03-15 DIAGNOSIS — J449 Chronic obstructive pulmonary disease, unspecified: Secondary | ICD-10-CM

## 2012-03-15 DIAGNOSIS — I2510 Atherosclerotic heart disease of native coronary artery without angina pectoris: Secondary | ICD-10-CM

## 2012-03-15 DIAGNOSIS — R0602 Shortness of breath: Secondary | ICD-10-CM

## 2012-03-15 DIAGNOSIS — R0600 Dyspnea, unspecified: Secondary | ICD-10-CM

## 2012-03-15 DIAGNOSIS — J45909 Unspecified asthma, uncomplicated: Secondary | ICD-10-CM

## 2012-03-15 LAB — PULMONARY FUNCTION TEST

## 2012-03-15 MED ORDER — TIOTROPIUM BROMIDE MONOHYDRATE 18 MCG IN CAPS: 18.0000 ug | ORAL_CAPSULE | Freq: Every day | RESPIRATORY_TRACT | Status: DC

## 2012-03-15 NOTE — Patient Instructions (Addendum)
Difficult situation with shortness of breath  Your heart, weight, lack of fitness, diabetes, all contributing to shortness of breath In addition you likely have asthma/copd based on improvement with advair and breathing test results Unfortunately there is no magic bullet to help you because you cannot do rehab at hospital due to vision issues and joint issues I recommend you  - Continue cardiology advice  - continue advair  - Please start spiriva 1 puff daily - take sample, script and show technique  - I am tryoing to arrange a pulmonary program for you at home Return in 3 months or sooner if needed

## 2012-03-15 NOTE — Progress Notes (Signed)
Subjective:    Patient ID: Mark French, male    DOB: 12-Jul-1946, 66 y.o.   MRN: [id number]  HPI IOV 01/03/2012  67 year old Body mass index is 36.48 kg/(m^2).  reports that he has never smoked. He has never used smokeless tobacco. 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  CAD and s/p CABG 1998 and now  MI in Sep 07, 2011 and s/p stent (cardiac arrest for '20 sec'). Went to [facility] nursing home and since then reports dyspnea on exertion. Progressive. Insidious onset. Also using roller assist to walk since MI in Jan 2013 but this is due to hip issues and diabetic retinopathy. Wife and he report associated significant deconditioning since Jan 2013. This is improving but not to the desired extent. Denies associated smoking or snoring. Dyspnea is associtaed with orthopnea and paroxysnal nocturnal dyspnea but this is chronic > 15 years and unchangd since jan 2013. HDyspnea is aggravated by "anything" which is at random and even at rest. Dyspnea relieved by advair disk started early maay 2013.   VQ scan 12/01/11 Very low probability for pulmonary embolism.  12/05/11: Nuc med stress test Overall Impression: Abnormal stress nuclear study with a large, severe, fixed inferolateral defect consistent with previous infarct; no ischemia.  LV Ejection Fraction: 25% (note on Sep 07, 2011 ef 45% but no prior data available). LV Wall Motion: Inferolateral akinesis. Cards thought it was worse and meds adjusted. Per family no repepat cath due to renal function concern   Spirometry 12/20/11  - fev1 1.75L/53%, FVC 2.25L/73% and c/w restriction  Labs 12/31/11  creat 1.4mg %  hgb 11.9gm%  CXR 01/01/12 IMPRESSION:  No evidence of active pulmonary disease.  Original Report Authenticated By: Marlon Pel, M  REC Difficult situation with shortness of breath  Your heart, weight, lack of fitness, diabetes, all contributing to shortness of breath I do  not think there is lung disease per se and the abnormal breathing test probably reflects your weight  Unfortunately there is no magic bullet Continue cardiology advice Because you feel strongly that advair helps you, we will honor that and give you advair 1 puff twice daily and albuterol as needed   - my nurse will check if you need refills We will refer you to pulmonary rehab Followup 2 months or sooner if needed with full PFT   OV 03/15/2012  Followup dyspnea  Advair helping some. But overall no change in dyspnea. PFT overall unchanged despite advair. He has no new complaints  PFTs - Fev1 1.78L/68%, Ratio 76 (70). Small airways 55%, TLC 4L/73%, DLCO 16/68%  Past, Family, Social reviewed: no change since last visit  Review of Systems  Constitutional: Negative for fever and unexpected weight change.  HENT: Negative for ear pain, nosebleeds, congestion, sore throat, rhinorrhea, sneezing, trouble swallowing, dental problem, postnasal drip and sinus pressure.   Eyes: Negative for redness and itching.  Respiratory: Positive for shortness of breath. Negative for cough, chest tightness and wheezing.   Cardiovascular: Negative for palpitations and leg swelling.  Gastrointestinal: Negative for nausea and vomiting.  Genitourinary: Negative for dysuria.  Musculoskeletal: Negative for joint swelling.  Skin: Negative for rash.  Neurological: Negative for headaches.  Hematological: Bruises/bleeds easily.  Psychiatric/Behavioral: Negative for dysphoric mood. The patient is not nervous/anxious.        Objective:   Physical Exam Physical Exam  Nursing note and vitals reviewed. Constitutional: He is oriented to person,  place, and time. He appears well-developed and well-nourished. No distress.       Obese Legally blind  - sitting with sunglasses Looks deconditioned  HENT:  Head: Normocephalic and atraumatic.  Right Ear: External ear normal.  Left Ear: External ear normal.  Mouth/Throat:  Oropharynx is clear and moist. No oropharyngeal exudate.  Eyes: Conjunctivae and EOM are normal. Pupils are equal, round, and reactive to light. Right eye exhibits no discharge. Left eye exhibits no discharge. No scleral icterus.  Neck: Normal range of motion. Neck supple. No JVD present. No tracheal deviation present. No thyromegaly present.  Cardiovascular: Normal rate, regular rhythm and intact distal pulses.  Exam reveals no gallop and no friction rub.   No murmur heard. Pulmonary/Chest: Effort normal and breath sounds normal. No respiratory distress. He has no wheezes. He has no rales. He exhibits no tenderness.  Abdominal: Soft. Bowel sounds are normal. He exhibits no distension and no mass. There is no tenderness. There is no rebound and no guarding.  Musculoskeletal: Normal range of motion. He exhibits no edema and no tenderness.       Using rollator to walk  Lymphadenopathy:    He has no cervical adenopathy.  Neurological: He is alert and oriented to person, place, and time. He has normal reflexes. No cranial nerve deficit. Coordination normal.  Skin: Skin is warm and dry. No rash noted. He is not diaphoretic. No erythema. No pallor.  Psychiatric: He has a normal mood and affect. His behavior is normal. Judgment and thought content normal.             Assessment & Plan:

## 2012-03-15 NOTE — Progress Notes (Signed)
PFT done. Latina Frank, CMA  

## 2012-03-20 ENCOUNTER — Telehealth: Payer: Self-pay | Admitting: Internal Medicine

## 2012-03-20 NOTE — Telephone Encounter (Signed)
What does she mean by tele monitoring equipment ?

## 2012-03-20 NOTE — Telephone Encounter (Signed)
I spoke with [facility] and she stated it consist of a scale, pulse ox, BP cuff and it is monitored daily and it is all wireless. Please advise thanks

## 2012-03-20 NOTE — Telephone Encounter (Signed)
I spoke with [facility] from [facility] and she received an order from MR to help teach pt about COPD. She is wanting to know if MR would okay for tele monitoring equipment to help pt. Please advise MR thanks

## 2012-03-24 NOTE — Assessment & Plan Note (Signed)
Difficult situation with shortness of breath  Your heart, weight, lack of fitness, diabetes, all contributing to shortness of breath In addition you likely have asthma/copd based on improvement with advair and breathing test results Unfortunately there is no magic bullet to help you because you cannot do rehab at hospital due to vision issues and joint issues I recommend you  - Continue cardiology advice  - continue advair  - Please start spiriva 1 puff daily - take sample, script and show technique  - I am tryoing to arrange a pulmonary program for you at home Return in 3 months or sooner if needed   > 50% of this 15 min visit spent in face to face counseling

## 2012-03-26 NOTE — Telephone Encounter (Signed)
Ok with me if patient ok with additional cost if any

## 2012-03-26 NOTE — Telephone Encounter (Signed)
I spoke with Mark French and gave verbal for equipment. Carron Curie, CMA

## 2012-04-04 ENCOUNTER — Emergency Department (HOSPITAL_COMMUNITY)
Admission: EM | Admit: 2012-04-04 | Discharge: 2012-04-04 | Disposition: A | Discharge status: Home / Self Care | Payer: Medicare Other | Attending: Emergency Medicine | Admitting: Emergency Medicine

## 2012-04-04 ENCOUNTER — Telehealth: Payer: Self-pay | Admitting: Cardiology

## 2012-04-04 ENCOUNTER — Encounter (HOSPITAL_COMMUNITY): Payer: Self-pay | Admitting: *Deleted

## 2012-04-04 ENCOUNTER — Emergency Department (HOSPITAL_COMMUNITY): Payer: Medicare Other

## 2012-04-04 DIAGNOSIS — J4489 Other specified chronic obstructive pulmonary disease: Secondary | ICD-10-CM | POA: Insufficient documentation

## 2012-04-04 DIAGNOSIS — E669 Obesity, unspecified: Secondary | ICD-10-CM | POA: Insufficient documentation

## 2012-04-04 DIAGNOSIS — Z951 Presence of aortocoronary bypass graft: Secondary | ICD-10-CM | POA: Insufficient documentation

## 2012-04-04 DIAGNOSIS — D509 Iron deficiency anemia, unspecified: Secondary | ICD-10-CM | POA: Insufficient documentation

## 2012-04-04 DIAGNOSIS — Z794 Long term (current) use of insulin: Secondary | ICD-10-CM | POA: Insufficient documentation

## 2012-04-04 DIAGNOSIS — H548 Legal blindness, as defined in USA: Secondary | ICD-10-CM | POA: Insufficient documentation

## 2012-04-04 DIAGNOSIS — Z8673 Personal history of transient ischemic attack (TIA), and cerebral infarction without residual deficits: Secondary | ICD-10-CM | POA: Insufficient documentation

## 2012-04-04 DIAGNOSIS — J449 Chronic obstructive pulmonary disease, unspecified: Secondary | ICD-10-CM | POA: Insufficient documentation

## 2012-04-04 DIAGNOSIS — I2510 Atherosclerotic heart disease of native coronary artery without angina pectoris: Secondary | ICD-10-CM | POA: Insufficient documentation

## 2012-04-04 DIAGNOSIS — E109 Type 1 diabetes mellitus without complications: Secondary | ICD-10-CM | POA: Insufficient documentation

## 2012-04-04 DIAGNOSIS — Z79899 Other long term (current) drug therapy: Secondary | ICD-10-CM | POA: Insufficient documentation

## 2012-04-04 DIAGNOSIS — E7800 Pure hypercholesterolemia, unspecified: Secondary | ICD-10-CM | POA: Insufficient documentation

## 2012-04-04 DIAGNOSIS — R0602 Shortness of breath: Secondary | ICD-10-CM | POA: Insufficient documentation

## 2012-04-04 DIAGNOSIS — I10 Essential (primary) hypertension: Secondary | ICD-10-CM | POA: Insufficient documentation

## 2012-04-04 DIAGNOSIS — I252 Old myocardial infarction: Secondary | ICD-10-CM | POA: Insufficient documentation

## 2012-04-04 HISTORY — DX: Chronic obstructive pulmonary disease, unspecified: J44.9

## 2012-04-04 LAB — CBC WITH DIFFERENTIAL/PLATELET
Basophils Absolute: 0 10*3/uL (ref 0.0–0.1)
Basophils Relative: 0 % (ref 0–1)
Hemoglobin: 10.7 g/dL — ABNORMAL LOW (ref 13.0–17.0)
MCHC: 31.5 g/dL (ref 30.0–36.0)
Monocytes Relative: 10 % (ref 3–12)
Neutro Abs: 5.6 10*3/uL (ref 1.7–7.7)
Neutrophils Relative %: 72 % (ref 43–77)

## 2012-04-04 LAB — POCT I-STAT TROPONIN I
Troponin i, poc: 0.01 ng/mL (ref 0.00–0.08)
Troponin i, poc: 0 ng/mL (ref 0.00–0.08)

## 2012-04-04 LAB — BASIC METABOLIC PANEL
BUN: 33 mg/dL — ABNORMAL HIGH (ref 6–23)
GFR calc Af Amer: 63 mL/min — ABNORMAL LOW (ref >90)
GFR calc non Af Amer: 54 mL/min — ABNORMAL LOW (ref >90)
Potassium: 3.8 mEq/L (ref 3.5–5.1)
Sodium: 136 mEq/L (ref 135–145)

## 2012-04-04 NOTE — ED Notes (Signed)
Pt c/o  SOB started around 1030 this morning then again around 3pm today. Pt denies CP. Bilateral lung sounds clear.

## 2012-04-04 NOTE — ED Provider Notes (Signed)
History     CSN: [id number]  Arrival date & time 04/04/12  1405   First MD Initiated Contact with Patient 04/04/12 1636      Chief Complaint  Patient presents with  . Shortness of Breath  . Chest Pain   In addition to what is listed below. Patient is a 66 year old male with past medical history significant for coronary artery disease, diabetes, s/p CABG and stent placement, hypertension, and COPD who presents to the emergency department with complaints of shortness of breath. Patient states around 10 AM he was walking outside to take his garbage out and after doing so he began having moderate shortness of breath. He states that then he returned to his home and rested for a time and tried taking his home albuterol. After a treatment he said that his symptoms persisted and he presented to the emergency department. However upon arrival in the emergency room patient stated that his shortness of breath had resolved. Associated symptoms included mild lightheadedness but he specifically denies any chest pain diaphoresis palpitations or other complaints. He states currently he is now back to his current state of health. He denies missing any medication doses or changes in his diet.  (Consider location/radiation/quality/duration/timing/severity/associated sxs/prior treatment) Patient is a 66 y.o. male presenting with shortness of breath. The history is provided by the patient and medical records. No language interpreter was used.  Shortness of Breath  The current episode started today. The problem occurs occasionally. The problem has been resolved. The problem is severe. The symptoms are relieved by rest. The symptoms are aggravated by activity. Associated symptoms include shortness of breath. He has been behaving normally. Urine output has been normal. There were no sick contacts.    Past Medical History  Diagnosis Date  . CORONARY ARTERY DISEASE 03/11/2007    s/p CABG 1998;  admitted 08/2011 with an  inferior STEMI.  LHC 09/07/11: Severe  Three-vessel CAD, LIMA-LAD patent, SVG-RCA chronically occluded, SVG-circumflex chronically occluded, SVG-diagonal 95%.  PCI: Promus DES to the SVG-diagonal.  Procedure c/b transient CHB req CPR and TTVP;  myoview 4/13:large severe inferolateral infarct, no ischemia, EF 25%    . DIABETES MELLITUS, TYPE I 03/11/2007  . HYPERTENSION 03/11/2007  . Proliferative diabetic retinopathy 03/09/2009  . HYPERCHOLESTEROLEMIA 09/28/2008  . CERVICAL RADICULOPATHY, LEFT 09/23/2008  . ANEMIA-IRON DEFICIENCY 09/23/2008  . PERIPHERAL NEUROPATHY 09/23/2008  . Chronic systolic heart failure 09/23/2008    Echocardiogram 09/07/11: Difficult study, inferior and posterior lateral wall hypokinesis, mild LVH, EF 45%, mild LAE.   [name] RENAL INSUFFICIENCY 03/15/2010  . PERIPHERAL VASCULAR INSUFFICIENCY,  LEGS, BILATERAL 08/17/2010    ABI's performed 04/19/11 R 0.72 L 0.71  . ED (erectile dysfunction)   . Hypogonadism male   . GI bleed 2012    Multiple Duodenal ulcer   . Obesity   . Legally blind     bilaterally  . Stroke 25    age 90 months; residual right sided weakness  . History of blood transfusion 2012  . Charcot foot due to diabetes mellitus     chronic pain  . Osteoarthritis     right hip and shoulder  . Myocardial infarction   . COPD (chronic obstructive pulmonary disease)     Past Surgical History  Procedure Date  . Carpal tunnel release     left  . Sigmoidoscopy 03/11/2001  . Venous doppler 01/30/2004  . Eye surgery   . Fracture surgery   . Tonsillectomy and adenoidectomy     "as a  kid"  . Cataract extraction w/ intraocular lens  implant, bilateral   . Coronary artery bypass graft 1998    CABG X5  . Coronary angioplasty with stent placement 08/2011    "1"  . Retinopathy surgery 2000's    "laser; both eyes"  . Wrist fusion 1976    right  . Anterior fusion cervical spine 2010    "C spine; Dr. Yetta Barre"  . Ankle fracture surgery 1976; ?date    LEFT:  fused; removed  bulk of hardware  . Heel spur surgery ~ 2007    ? left  . Irrigation and debridement knee 02/27/2012    Procedure: IRRIGATION AND DEBRIDEMENT KNEE;  Surgeon: Verlee Rossetti, MD;  Location: [facility] OR;  Service: Orthopedics;  Laterality: Right;  irrigation and drainage right knee septic bursitis    Family History  Problem Relation Age of Onset  . Heart disease Mother     Coronary Artery Disease  . Heart disease Father     Coronary Artery Disease  . Heart disease Paternal Grandmother     Coronary Artery Disease  . Colon cancer Neg Hx   . Diabetes Other     Grandmother    History  Substance Use Topics  . Smoking status: Never Smoker   . Smokeless tobacco: Never Used  . Alcohol Use: No      Review of Systems  Respiratory: Positive for shortness of breath.   All other systems reviewed and are negative.    Allergies  Sildenafil and Contrast media  Home Medications   Current Outpatient Rx  Name Route Sig Dispense Refill  . ALBUTEROL SULFATE HFA 108 (90 BASE) MCG/ACT IN AERS Inhalation Inhale 2 puffs into the lungs every 4 (four) hours as needed. For shortness of breath    . ASPIRIN 81 MG PO TABS Oral Take 81 mg by mouth daily.    [name] BAYER BREEZE 2 TEST VI DISK  Use as directed    . CARVEDILOL 6.25 MG PO TABS Oral Take 6.25 mg by mouth 2 (two) times daily with a meal.    . CLOPIDOGREL BISULFATE 75 MG PO TABS Oral Take 75 mg by mouth daily with breakfast.    . DOCUSATE SODIUM 100 MG PO TABS Oral Take 100 mg by mouth daily as needed. constipation    . FLUTICASONE-SALMETEROL 100-50 MCG/DOSE IN AEPB Inhalation Inhale 1 puff into the lungs 2 (two) times daily. 1 each 11  . FUROSEMIDE 80 MG PO TABS Oral Take 80 mg by mouth 2 (two) times daily.     [name] GABAPENTIN 300 MG PO CAPS Oral Take 1 capsule (300 mg total) by mouth 3 (three) times daily.    [name] HYDROCODONE-APAP-DIETARY PROD 10-325 MG PO MISC Oral Take 1-1.5 tablets by mouth Twice daily.    . INSULIN ISOPHANE HUMAN 100 UNIT/ML [place] SUSP  Subcutaneous Inject 55 Units into the skin daily before breakfast.     . INSULIN SYRINGE 31G X 5/16" 0.5 ML MISC  Use as directed    . ISOSORBIDE MONONITRATE ER 30 MG PO TB24 Oral Take 30 mg by mouth daily.    [name] LOSARTAN POTASSIUM 50 MG PO TABS Oral Take 50 mg by mouth daily.    [name] NOVOLOG FLEXPEN 100 UNIT/ML [place] SOLN Subcutaneous Inject 20-30 Units into the skin 2 (two) times daily as needed. Sliding scale    . PANTOPRAZOLE SODIUM 40 MG PO TBEC Oral Take 40 mg by mouth daily.    [name] POTASSIUM CHLORIDE CRYS ER  20 MEQ PO TBCR Oral Take 20 mEq by mouth daily.    [name] PROBIOTIC PO Oral Take 1 capsule by mouth daily.    [name] ROSUVASTATIN CALCIUM 20 MG PO TABS Oral Take 20 mg by mouth daily.    [name] SPIRONOLACTONE 25 MG PO TABS Oral Take 12.5 mg by mouth daily.    [name] TAMSULOSIN HCL 0.4 MG PO CAPS Oral Take 0.4 mg by mouth daily.    [name] TIOTROPIUM BROMIDE MONOHYDRATE 18 MCG IN CAPS Inhalation Place 1 capsule (18 mcg total) into inhaler and inhale daily. 30 capsule 6    BP 119/39  Pulse 78  Temp 98 F (36.7 C) (Oral)  Resp 20  SpO2 99%  Physical Exam  Nursing note and vitals reviewed. Constitutional: He is oriented to person, place, and time. He appears well-developed and well-nourished. No distress.  HENT:  Head: Normocephalic and atraumatic.  Eyes: Conjunctivae and EOM are normal. Pupils are equal, round, and reactive to light.  Neck: Normal range of motion. Neck supple.  Cardiovascular: Normal rate, regular rhythm, normal heart sounds and intact distal pulses.   No murmur heard. Pulmonary/Chest: Effort normal and breath sounds normal. No respiratory distress. He has no wheezes. He exhibits no tenderness.  Abdominal: Soft. Bowel sounds are normal.  Musculoskeletal: Normal range of motion. He exhibits no edema and no tenderness.  Neurological: He is alert and oriented to person, place, and time. He has normal reflexes. He exhibits abnormal muscle tone (RUE with contractures; 4+/5 strength of RUE).     ED Course  Procedures (including critical care time)   Results for orders placed during the hospital encounter of 04/04/12  BASIC METABOLIC PANEL      Component Value Range   Sodium 136  135 - 145 mEq/L   Potassium 3.8  3.5 - 5.1 mEq/L   Chloride 99  96 - 112 mEq/L   CO2 28  19 - 32 mEq/L   Glucose, Bld 92  70 - 99 mg/dL   BUN 33 (*) 6 - 23 mg/dL   Creatinine, Ser 2.95  0.50 - 1.35 mg/dL   Calcium 9.3  8.4 - 28.4 mg/dL   GFR calc non Af Amer 54 (*) >90 mL/min   GFR calc Af Amer 63 (*) >90 mL/min  CBC WITH DIFFERENTIAL      Component Value Range   WBC 7.8  4.0 - 10.5 K/uL   RBC 3.99 (*) 4.22 - 5.81 MIL/uL   Hemoglobin 10.7 (*) 13.0 - 17.0 g/dL   HCT 13.2 (*) 44.0 - 10.2 %   MCV 85.2  78.0 - 100.0 fL   MCH 26.8  26.0 - 34.0 pg   MCHC 31.5  30.0 - 36.0 g/dL   RDW 72.5  36.6 - 44.0 %   Platelets 190  150 - 400 K/uL   Neutrophils Relative 72  43 - 77 %   Neutro Abs 5.6  1.7 - 7.7 K/uL   Lymphocytes Relative 15  12 - 46 %   Lymphs Abs 1.1  0.7 - 4.0 K/uL   Monocytes Relative 10  3 - 12 %   Monocytes Absolute 0.8  0.1 - 1.0 K/uL   Eosinophils Relative 4  0 - 5 %   Eosinophils Absolute 0.3  0.0 - 0.7 K/uL   Basophils Relative 0  0 - 1 %   Basophils Absolute 0.0  0.0 - 0.1 K/uL  POCT I-STAT TROPONIN I      Component Value Range   Troponin  i, poc 0.01  0.00 - 0.08 ng/mL   Comment 3           POCT I-STAT TROPONIN I      Component Value Range   Troponin i, poc 0.00  0.00 - 0.08 ng/mL   Comment 3              Labs Reviewed  BASIC METABOLIC PANEL - Abnormal; Notable for the following:    BUN 33 (*)     GFR calc non Af Amer 54 (*)     GFR calc Af Amer 63 (*)     All other components within normal limits  CBC WITH DIFFERENTIAL - Abnormal; Notable for the following:    RBC 3.99 (*)     Hemoglobin 10.7 (*)     HCT 34.0 (*)     All other components within normal limits  POCT I-STAT TROPONIN I  POCT I-STAT TROPONIN I   Dg Chest 2 View  04/04/2012  *RADIOLOGY REPORT*   Clinical Data: Shortness of breath, prior coronary bypass, diabetes, hypertension  CHEST - 2 VIEW  Comparison: 01/16/2012  Findings: Coronary bypass changes noted.  Stable cardiomegaly with vascular prominence.  No focal pneumonia, collapse, consolidation, edema, effusion or pneumothorax.  Lower cervical fusion hardware partially imaged.  IMPRESSION: Stable postoperative appearance.  No superimposed acute process   Original Report Authenticated By: Judie Petit. Ruel Favors, M.D.      No diagnosis found.   Date: 04/04/2012  Rate: 64  Rhythm: normal sinus rhythm  QRS Axis: normal  Intervals: PR prolonged  ST/T Wave abnormalities: normal  Conduction Disutrbances:first-degree A-V block   Narrative Interpretation:   Old EKG Reviewed: changes noted and VI now shows evidence of RBBB - not seen in prior EKGs    MDM    4:48 PM Patient bedded on track board but still not in room.  Will wait for bed placement.    5:19 PM patient is a 66 year old male with past medical history as listed above and relevant for coronary artery disease status post CABG and stent placement as well as COPD who presents to the emergency department with complaints of shortness of breath. Patient stated that he took his home albuterol prior to arrival in the emergency department with little relief, however shortly after arrival in our emergency department shortness of breath resolved. Currently patient is asymptomatic. Review of the vital signs upon arrival to the patient was afebrile and was satting 99% on room air and other vital signs unremarkable. On exam patient with mild trace bilateral lower extremity edema, the lungs were clear to auscultation bilaterally, and normal heart sounds. Review of the labs that were drawn in triage visit, shows a normal troponin and unremarkable CBC and BMP. Chest x-ray also shows no evidence of acute cardiopulmonary disease. EKG completed in triage shows sinus rhythm with first degree AV block. Criteria  also met for right bundle branch block but not seen in prior EKGs. Otherwise EKG is unchanged from prior. Given patient's past medical history and complaint of shortness of breath, although resolved, it was felt that a delta troponin was warranted to rule out possible cardiac ischemia contributing to patient's presentation. Will followup results and continue to monitor patient.   7:00 PM Delta troponin negative and patient remains without symptoms.  Spoke with [facility] (Dr. Diona Browner) and they agree that patient may be safely discharged with close outpatient follow up.  Patient is to be contacted tomorrow for next possible visit.  Patient  remained HDS, without new complaints, and discharged without acute events.          Johnney Ou, MD 04/04/12 [phone]

## 2012-04-04 NOTE — ED Provider Notes (Signed)
I saw and evaluated the patient, reviewed the resident's note and I agree with the findings including ECG and plan.  Few hours dyspnea no CP used MDI several times now resolved, recommended repeat trop at ~1730 and 2030 Pt refuses to stay past 1730 trop and wants OutPt f/u only. Had apparent neg myoview for ischemia Spring 2013.  Hurman Horn, MD 04/06/12 [phone]

## 2012-04-04 NOTE — Telephone Encounter (Signed)
Received telephone call from [facility] ER while on call regarding patient. Patient being sent home by ER staff with request for [name] to arrange a followup visit. He had been observed with recent episode of shortness of breath and lightheadedness that resolved. Lab work revealed normal troponins, hemoglobin 10.7 which is stable, creatinine 1.3. ECG with sinus rhythm, prolonged PR interval, right bundle branch block, probable old lateral infarct pattern. Record review finds the patient was seen in June by Dr. Excell Seltzer who follows his cardiac disease. I explained that I would relay the message to Dr. Earmon Phoenix nurse regarding arranging a followup visit for this patient within the next week. He could be seen by a physician extender if Dr. Excell Seltzer is not available. Office should call patient with details of visit.  Jonelle Sidle, M.D., F.A.C.C.

## 2012-04-04 NOTE — ED Notes (Signed)
Pt reports sob since this am. O2 sats 99% on RA. Speaking in complete in sentences. Pt had episode of chest pain, midsternal, in triage that last approx 10sec. Pt reports this was his first episode of CP today.

## 2012-04-05 ENCOUNTER — Other Ambulatory Visit: Payer: Self-pay | Admitting: Endocrinology

## 2012-04-05 NOTE — Telephone Encounter (Signed)
Pt scheduled to see Dr Excell Seltzer on 04/09/12.

## 2012-04-09 ENCOUNTER — Ambulatory Visit (INDEPENDENT_AMBULATORY_CARE_PROVIDER_SITE_OTHER): Payer: Medicare Other | Admitting: Cardiovascular Disease

## 2012-04-09 ENCOUNTER — Encounter: Payer: Self-pay | Admitting: Cardiovascular Disease

## 2012-04-09 VITALS — BP 122/60 | HR 64 | Ht 67.0 in | Wt 219.4 lb

## 2012-04-09 DIAGNOSIS — I5022 Chronic systolic (congestive) heart failure: Secondary | ICD-10-CM

## 2012-04-09 DIAGNOSIS — E7800 Pure hypercholesterolemia, unspecified: Secondary | ICD-10-CM

## 2012-04-09 DIAGNOSIS — I25810 Atherosclerosis of coronary artery bypass graft(s) without angina pectoris: Secondary | ICD-10-CM

## 2012-04-09 MED ORDER — ATORVASTATIN CALCIUM 40 MG PO TABS: 40.0000 mg | ORAL_TABLET | Freq: Every day | ORAL | Status: DC

## 2012-04-09 NOTE — Assessment & Plan Note (Signed)
They requested change from Crestor to another drug for cost savings. Will switch to Lipitor 40 mg.

## 2012-04-09 NOTE — Assessment & Plan Note (Signed)
The patient has class III symptoms. His symptoms are multifactorial with heart failure, lung disease, morbid obesity, and poor functional capacity. Accordingly the best we can with his current medical program I have not recommended any changes. I did review all of his lab work and other data are related to his recent emergency room visit. I will see him back in 3 months.

## 2012-04-09 NOTE — Patient Instructions (Addendum)
Your physician recommends that you schedule a follow-up appointment in: 3 MONTHS with Dr Excell Seltzer  Your physician has recommended you make the following change in your medication: STOP Crestor, START Atorvastatin (Lipitor) 40mg  take one by mouth every evening

## 2012-04-09 NOTE — Assessment & Plan Note (Signed)
Stable without anginal symptoms. He continues on aspirin and Plavix.

## 2012-04-09 NOTE — Progress Notes (Signed)
HPI:  66 year old gentleman presenting for followup evaluation. The patient has CAD status post CABG in 1998. He had a myocardial infarction several months ago. He has multiple severe comorbidities including peripheral arterial disease, long-standing type 2 diabetes, obesity, and poor functional capacity. He is essentially wheelchair-bound. He is legally blind.  The patient was just evaluated in the emergency room a few days ago for shortness of breath. He underwent cycle cardiac enzymes, chest x-ray, and lab work. There were no major abnormalities found and he was discharged home. He's done well in the interim has had no more episodes of shortness of breath. In retrospect, he feels like he probably overdid it. He pulled a trash can bind his motorized scooter and got very worn out with activity. He has had no recent chest pain or pressure, orthopnea, PND, or palpitations. He has chronic leg swelling is unchanged. He has dyspnea with low-level exertion.  Outpatient Encounter Prescriptions as of 04/09/2012  Medication Sig Dispense Refill  . albuterol (PROVENTIL HFA;VENTOLIN HFA) 108 (90 BASE) MCG/ACT inhaler Inhale 2 puffs into the lungs every 4 (four) hours as needed. For shortness of breath      . aspirin 81 MG tablet Take 81 mg by mouth daily.      . carvedilol (COREG) 6.25 MG tablet Take 6.25 mg by mouth 2 (two) times daily with a meal.      . clopidogrel (PLAVIX) 75 MG tablet Take 75 mg by mouth daily with breakfast.      . Docusate Sodium (STOOL SOFTENER) 100 MG capsule Take 100 mg by mouth daily as needed. constipation      . Fluticasone-Salmeterol (ADVAIR DISKUS) 100-50 MCG/DOSE AEPB Inhale 1 puff into the lungs 2 (two) times daily.  1 each  11  . furosemide (LASIX) 80 MG tablet Take 80 mg by mouth 2 (two) times daily.       [name] gabapentin (NEURONTIN) 300 MG capsule Take 1 capsule (300 mg total) by mouth 3 (three) times daily.      [name] HYDROcodone-acetaminophen (NORCO) 10-325 MG per tablet Take 1  tablet by mouth every 6 (six) hours as needed. For pain      . insulin NPH (HUMULIN N,NOVOLIN N) 100 UNIT/ML injection Inject 55 Units into the skin daily before breakfast.       . isosorbide mononitrate (IMDUR) 30 MG 24 hr tablet Take 30 mg by mouth daily.      [name] losartan (COZAAR) 50 MG tablet Take 50 mg by mouth daily.      [name] NOVOLOG FLEXPEN 100 UNIT/ML injection Inject 20-30 Units into the skin 2 (two) times daily as needed. Sliding scale      . pantoprazole (PROTONIX) 40 MG tablet Take 40 mg by mouth daily.      . potassium chloride SA (K-DUR,KLOR-CON) 20 MEQ tablet Take 20 mEq by mouth daily.      [name] PROAIR HFA 108 (90 BASE) MCG/ACT inhaler INHALE 2 PUFFS INTO THE LUNGS EVERY 6 HOURS AS NEEDED FOR WHEEZING  1 Inhaler  2  . Probiotic Product (PROBIOTIC PO) Take 1 capsule by mouth daily.      . rosuvastatin (CRESTOR) 20 MG tablet Take 20 mg by mouth daily.      [name] spironolactone (ALDACTONE) 25 MG tablet Take 12.5 mg by mouth daily.      . Tamsulosin HCl (FLOMAX) 0.4 MG CAPS Take 0.4 mg by mouth daily.      [name] tiotropium (SPIRIVA) 18 MCG inhalation capsule Place 1  capsule (18 mcg total) into inhaler and inhale daily.  30 capsule  6    Allergies  Allergen Reactions  . Sildenafil Nausea Only and Other (See Comments)    "took it once; heart went to pieces; never took it again"  . Contrast Media (Iodinated Diagnostic Agents) Nausea Only    Past Medical History  Diagnosis Date  . CORONARY ARTERY DISEASE 03/11/2007    s/p CABG 1998;  admitted 08/2011 with an inferior STEMI.  LHC 09/07/11: Severe  Three-vessel CAD, LIMA-LAD patent, SVG-RCA chronically occluded, SVG-circumflex chronically occluded, SVG-diagonal 95%.  PCI: Promus DES to the SVG-diagonal.  Procedure c/b transient CHB req CPR and TTVP;  myoview 4/13:large severe inferolateral infarct, no ischemia, EF 25%    . DIABETES MELLITUS, TYPE I 03/11/2007  . HYPERTENSION 03/11/2007  . Proliferative diabetic retinopathy 03/09/2009  .  HYPERCHOLESTEROLEMIA 09/28/2008  . CERVICAL RADICULOPATHY, LEFT 09/23/2008  . ANEMIA-IRON DEFICIENCY 09/23/2008  . PERIPHERAL NEUROPATHY 09/23/2008  . Chronic systolic heart failure 09/23/2008    Echocardiogram 09/07/11: Difficult study, inferior and posterior lateral wall hypokinesis, mild LVH, EF 45%, mild LAE.   [name] RENAL INSUFFICIENCY 03/15/2010  . PERIPHERAL VASCULAR INSUFFICIENCY,  LEGS, BILATERAL 08/17/2010    ABI's performed 04/19/11 R 0.72 L 0.71  . ED (erectile dysfunction)   . Hypogonadism male   . GI bleed 2012    Multiple Duodenal ulcer   . Obesity   . Legally blind     bilaterally  . Stroke 61    age 59 months; residual right sided weakness  . History of blood transfusion 2012  . Charcot foot due to diabetes mellitus     chronic pain  . Osteoarthritis     right hip and shoulder  . Myocardial infarction   . COPD (chronic obstructive pulmonary disease)     ROS: Negative except as per HPI  BP 122/60  Pulse 64  Ht 5\' 7"  (1.702 m)  Wt 99.519 kg (219 lb 6.4 oz)  BMI 34.36 kg/m2  PHYSICAL EXAM: Pt is alert and oriented, pleasant, chronically ill-appearing obese male in NAD HEENT: normal Neck: JVP - normal Lungs: CTA bilaterally CV: RRR without murmur or gallop Abd: soft, NT, Positive BS, obese Ext: 2+ brawny edema of the right lower extremity, 1+ brawny edema on the left, distal pulses intact and equal Skin: warm/dry no rash  ASSESSMENT AND PLAN:

## 2012-04-10 ENCOUNTER — Other Ambulatory Visit: Payer: Self-pay | Admitting: Endocrinology

## 2012-04-16 ENCOUNTER — Ambulatory Visit (INDEPENDENT_AMBULATORY_CARE_PROVIDER_SITE_OTHER): Payer: Medicare Other | Admitting: Endocrinology

## 2012-04-16 ENCOUNTER — Encounter: Payer: Self-pay | Admitting: Endocrinology

## 2012-04-16 ENCOUNTER — Other Ambulatory Visit (INDEPENDENT_AMBULATORY_CARE_PROVIDER_SITE_OTHER): Payer: Medicare Other

## 2012-04-16 VITALS — BP 112/58 | HR 71 | Temp 97.0°F | Ht 66.0 in | Wt 220.0 lb

## 2012-04-16 DIAGNOSIS — Z125 Encounter for screening for malignant neoplasm of prostate: Secondary | ICD-10-CM | POA: Insufficient documentation

## 2012-04-16 DIAGNOSIS — D509 Iron deficiency anemia, unspecified: Secondary | ICD-10-CM

## 2012-04-16 DIAGNOSIS — Z79899 Other long term (current) drug therapy: Secondary | ICD-10-CM

## 2012-04-16 DIAGNOSIS — E119 Type 2 diabetes mellitus without complications: Secondary | ICD-10-CM

## 2012-04-16 LAB — IBC PANEL
Saturation Ratios: 7.2 % — ABNORMAL LOW (ref 20.0–50.0)
Transferrin: 296.2 mg/dL (ref 212.0–360.0)

## 2012-04-16 LAB — MICROALBUMIN / CREATININE URINE RATIO: Creatinine,U: 56.9 mg/dL

## 2012-04-16 LAB — HEMOGLOBIN A1C: Hgb A1c MFr Bld: 6.4 % (ref 4.6–6.5)

## 2012-04-16 MED ORDER — HYDROCODONE-ACETAMINOPHEN 10-325 MG PO TABS: 1.0000 | ORAL_TABLET | ORAL | Status: DC | PRN

## 2012-04-16 MED ORDER — GABAPENTIN 600 MG PO TABS: 600.0000 mg | ORAL_TABLET | Freq: Two times a day (BID) | ORAL | Status: DC

## 2012-04-16 MED ORDER — GLUCOSE BLOOD VI DISK: 1.0000 | DISK | Freq: Three times a day (TID) | Status: DC

## 2012-04-16 NOTE — Patient Instructions (Addendum)
blood tests are being requested for you today.  You will receive a letter with results. please consider these measures for your health:  minimize alcohol.  do not use tobacco products.  have a colonoscopy at least every 10 years from age 66.  keep firearms safely stored.  always use seat belts.  have working smoke alarms in your home.  see an eye doctor and dentist regularly.  never drive under the influence of alcohol or drugs (including prescription drugs).  those with fair skin should take precautions against the sun. please let me know what your wishes would be, if artificial life support measures should become necessary.  it is critically important to prevent falling down (keep floor areas well-lit, dry, and free of loose objects.  If you have a cane, walker, or wheelchair, you should use it, even for short trips around the house.  Also, try not to rush).  Please come back for a follow-up appointment in 4 months.   Increase the gabapentin to 600 mg, twice a day.  i have sent a prescription to your pharmacy.

## 2012-04-16 NOTE — Progress Notes (Signed)
Subjective:    Patient ID: Mark French, male    DOB: 04-03-1946, 66 y.o.   MRN: [id number]  HPI here for regular wellness examination.  He's feeling pretty well in general, and says chronic med probs are stable, except as noted below Past Medical History  Diagnosis Date  . CORONARY ARTERY DISEASE 03/11/2007    s/p CABG 1998;  admitted 08/2011 with an inferior STEMI.  LHC 09/07/11: Severe  Three-vessel CAD, LIMA-LAD patent, SVG-RCA chronically occluded, SVG-circumflex chronically occluded, SVG-diagonal 95%.  PCI: Promus DES to the SVG-diagonal.  Procedure c/b transient CHB req CPR and TTVP;  myoview 4/13:large severe inferolateral infarct, no ischemia, EF 25%    . DIABETES MELLITUS, TYPE I 03/11/2007  . HYPERTENSION 03/11/2007  . Proliferative diabetic retinopathy 03/09/2009  . HYPERCHOLESTEROLEMIA 09/28/2008  . CERVICAL RADICULOPATHY, LEFT 09/23/2008  . ANEMIA-IRON DEFICIENCY 09/23/2008  . PERIPHERAL NEUROPATHY 09/23/2008  . Chronic systolic heart failure 09/23/2008    Echocardiogram 09/07/11: Difficult study, inferior and posterior lateral wall hypokinesis, mild LVH, EF 45%, mild LAE.   [name] RENAL INSUFFICIENCY 03/15/2010  . PERIPHERAL VASCULAR INSUFFICIENCY,  LEGS, BILATERAL 08/17/2010    ABI's performed 04/19/11 R 0.72 L 0.71  . ED (erectile dysfunction)   . Hypogonadism male   . GI bleed 2012    Multiple Duodenal ulcer   . Obesity   . Legally blind     bilaterally  . Stroke 82    age 39 months; residual right sided weakness  . History of blood transfusion 2012  . Charcot foot due to diabetes mellitus     chronic pain  . Osteoarthritis     right hip and shoulder  . Myocardial infarction   . COPD (chronic obstructive pulmonary disease)     Past Surgical History  Procedure Date  . Carpal tunnel release     left  . Sigmoidoscopy 03/11/2001  . Venous doppler 01/30/2004  . Eye surgery   . Fracture surgery   . Tonsillectomy and adenoidectomy     "as a kid"  . Cataract extraction w/  intraocular lens  implant, bilateral   . Coronary artery bypass graft 1998    CABG X5  . Coronary angioplasty with stent placement 08/2011    "1"  . Retinopathy surgery 2000's    "laser; both eyes"  . Wrist fusion 1976    right  . Anterior fusion cervical spine 2010    "C spine; Dr. Yetta Barre"  . Ankle fracture surgery 1976; ?date    LEFT:  fused; removed bulk of hardware  . Heel spur surgery ~ 2007    ? left  . Irrigation and debridement knee 02/27/2012    Procedure: IRRIGATION AND DEBRIDEMENT KNEE;  Surgeon: Verlee Rossetti, MD;  Location: [facility] OR;  Service: Orthopedics;  Laterality: Right;  irrigation and drainage right knee septic bursitis    History   Social History  . Marital Status: Married    Spouse Name: N/A    Number of Children: 0  . Years of Education: N/A   Occupational History  . Disabled    Social History Main Topics  . Smoking status: Never Smoker   . Smokeless tobacco: Never Used  . Alcohol Use: No  . Drug Use: No  . Sexually Active: No   Other Topics Concern  . Not on file   Social History Narrative   Comes to appointments with wife0 Caffeine drinks daily     Current Outpatient Prescriptions on File Prior to Visit  Medication  Sig Dispense Refill  . aspirin 81 MG tablet Take 81 mg by mouth daily.      [name] atorvastatin (LIPITOR) 40 MG tablet Take 1 tablet (40 mg total) by mouth daily.  30 tablet  11  . carvedilol (COREG) 6.25 MG tablet Take 6.25 mg by mouth 2 (two) times daily with a meal.      . clopidogrel (PLAVIX) 75 MG tablet Take 75 mg by mouth daily with breakfast.      . Docusate Sodium (STOOL SOFTENER) 100 MG capsule Take 100 mg by mouth daily as needed. constipation      . Fluticasone-Salmeterol (ADVAIR DISKUS) 100-50 MCG/DOSE AEPB Inhale 1 puff into the lungs 2 (two) times daily.  1 each  11  . furosemide (LASIX) 80 MG tablet Take 80 mg by mouth 2 (two) times daily.       [name] gabapentin (NEURONTIN) 300 MG capsule Take 1 capsule (300 mg total) by  mouth 3 (three) times daily.      [name] HYDROcodone-acetaminophen (NORCO) 10-325 MG per tablet Take 1 tablet by mouth every 6 (six) hours as needed. For pain      . insulin NPH (HUMULIN N,NOVOLIN N) 100 UNIT/ML injection Inject 55 Units into the skin daily before breakfast.       . isosorbide mononitrate (IMDUR) 30 MG 24 hr tablet Take 30 mg by mouth daily.      [name] NOVOLOG FLEXPEN 100 UNIT/ML injection Inject 20-30 Units into the skin 2 (two) times daily as needed. Sliding scale      . pantoprazole (PROTONIX) 40 MG tablet Take 40 mg by mouth daily.      . potassium chloride SA (K-DUR,KLOR-CON) 20 MEQ tablet Take 20 mEq by mouth daily.      [name] PROAIR HFA 108 (90 BASE) MCG/ACT inhaler INHALE 2 PUFFS INTO THE LUNGS EVERY 6 HOURS AS NEEDED FOR WHEEZING  8.5 g  2  . Probiotic Product (PROBIOTIC PO) Take 1 capsule by mouth daily.      [name] spironolactone (ALDACTONE) 25 MG tablet Take 12.5 mg by mouth daily.      . Tamsulosin HCl (FLOMAX) 0.4 MG CAPS Take 0.4 mg by mouth daily.      [name] tiotropium (SPIRIVA) 18 MCG inhalation capsule Place 1 capsule (18 mcg total) into inhaler and inhale daily.  30 capsule  6  . losartan (COZAAR) 50 MG tablet Take 50 mg by mouth daily.        Allergies  Allergen Reactions  . Sildenafil Nausea Only and Other (See Comments)    "took it once; heart went to pieces; never took it again"  . Contrast Media (Iodinated Diagnostic Agents) Nausea Only    Family History  Problem Relation Age of Onset  . Heart disease Mother     Coronary Artery Disease  . Heart disease Father     Coronary Artery Disease  . Heart disease Paternal Grandmother     Coronary Artery Disease  . Colon cancer Neg Hx   . Diabetes Other     Grandmother    BP 112/58  Pulse 71  Temp 97 F (36.1 C) (Oral)  Ht 5\' 6"  (1.676 m)  Wt 220 lb (99.791 kg)  BMI 35.51 kg/m2  SpO2 96%    Review of Systems  Constitutional: Negative for fever and unexpected weight change.  HENT: Negative for hearing loss.     Eyes: Negative for visual disturbance.  Respiratory: Negative for shortness of breath.   Cardiovascular: Negative for  chest pain.  Gastrointestinal: Negative for anal bleeding.  Genitourinary: Negative for difficulty urinating.  Musculoskeletal: Negative for back pain.  Skin: Negative for rash.  Neurological: Negative for syncope.  Hematological: Bruises/bleeds easily.  Psychiatric/Behavioral: Negative for dysphoric mood.      Objective:   Physical Exam VS: see vs page GEN: no distress HEAD: head: no deformity eyes: no periorbital swelling, no proptosis external nose and ears are normal mouth: no lesion seen NECK: supple, thyroid is not enlarged CHEST WALL: no deformity LUNGS: clear to auscultation BREASTS:  No gynecomastia CV: reg rate and rhythm, no murmur ABD: abdomen is soft, nontender.  no hepatosplenomegaly.  not distended.  no hernia RECTAL/PROSTATE:  Normal except for large prostate.  Heme neg MUSCULOSKELETAL: muscle bulk and strength are grossly normal.  no obvious joint swelling.  gait is slow but steady, with a cane.  There is a bandage on the right knee (recent ortho procedure). PULSES: no carotid bruit NEURO:  cn 2-12 grossly intact.   readily moves all 4's.  SKIN:  Normal texture and temperature.  No rash or suspicious lesion is visible.  Few ecchymoses on the forearms  NODES:  None palpable at the neck.   PSYCH: alert, oriented x3.  Does not appear anxious nor depressed.       Assessment & Plan:  Wellness visit today, with problems stable, except as noted.    SEPARATE EVALUATION FOLLOWS--EACH PROBLEM HERE IS NEW, NOT RESPONDING TO TREATMENT, OR POSES SIGNIFICANT RISK TO THE PATIENT'S HEALTH: HISTORY OF THE PRESENT ILLNESS: The state of at least three ongoing medical problems is addressed today: Pt returns for f/u of insulin-requiring DM (dx'ed 1995; complicated by CAD, renal insufficiency, peripheral sensory neuropathy and retinopathy).  no cbg record,  but states cbg's vary from 96-170.  There is no trend throughout the day. Anemia: denies hematuria Chronic leg an foot pain persists despite gabapentin PAST MEDICAL HISTORY reviewed and up to date today REVIEW OF SYSTEMS: denies hypoglycemia PHYSICAL EXAMINATION: VITAL SIGNS:  See vs page GENERAL: no distress Pulses: dorsalis pedis intact bilat.   Feet: no deformity.  no ulcer on the feet.  feet are of normal color and temp.  2+ bilat leg edema.   There is bilteral onychomycosis.  There is an abrasion on the left anterior tibial area. Neuro: sensation is intact to touch on the feet, but decreased from normal. LAB/XRAY RESULTS: Lab Results  Component Value Date   HGBA1C 6.4 04/16/2012  IMPRESSION: Dm, well-controlled Painful neuropathy, needs increased rx fe-deficiency anemia, needs increased rx PLAN: See instruction page

## 2012-04-17 ENCOUNTER — Other Ambulatory Visit: Payer: Self-pay | Admitting: Endocrinology

## 2012-04-17 ENCOUNTER — Encounter: Payer: Self-pay | Admitting: Endocrinology

## 2012-04-18 ENCOUNTER — Telehealth: Payer: Self-pay | Admitting: Endocrinology

## 2012-04-18 NOTE — Telephone Encounter (Signed)
Caller: Marilyn/Spouse; Phone: [phone]; Reason for Call: Wife calling regarding one of his insulins has been denied.  States it was the one that was fast acting, she did not know the name.  Please call her back.  Thanks

## 2012-04-18 NOTE — Telephone Encounter (Signed)
Pt's spouse is asking for refill of Novolog-please advise on directions.

## 2012-04-18 NOTE — Telephone Encounter (Signed)
novolog or humalog is ok, either pens or vials are ok

## 2012-04-19 NOTE — Telephone Encounter (Signed)
Please d/c novolog

## 2012-04-19 NOTE — Telephone Encounter (Signed)
What is the signature for the Novolog. How much is he supposed to be taking.

## 2012-04-19 NOTE — Telephone Encounter (Signed)
Pt's wife informed of MD's advisement. Pt's wife states that pt takes Novolog for CBG's over 200 several times a week (from 10-20 units) and when at OV on Tuesday, there was no discussion for d/c the as needed Novolog-please advise.

## 2012-04-19 NOTE — Telephone Encounter (Signed)
Please d/c novolog Increase NPH to 60 units qam

## 2012-04-20 MED ORDER — INSULIN NPH (HUMAN) (ISOPHANE) 100 UNIT/ML ~~LOC~~ SUSP: 60.0000 [IU] | Freq: Every morning | SUBCUTANEOUS | Status: DC

## 2012-04-20 NOTE — Telephone Encounter (Signed)
Pt informed of MD's advisement regarding insulin adjustment.  

## 2012-04-22 ENCOUNTER — Telehealth: Payer: Self-pay | Admitting: Internal Medicine

## 2012-04-22 NOTE — Telephone Encounter (Signed)
I spoke with Mark French, nurse from [facility] and she states she saw the pt today after being out for 3 weeks and he has been having leg swelling in both legs for about 2.5 weeks. She states he has gained 4 lbs over that time as well. She states his current weight is 220lbs. She states he is not having any SOB, cough and sats are 99% on room air. I advised that patients PCP should be notified, but Mark French states since MR is the ordering physician she has to notify him. Please advise.Mark French, CMA

## 2012-04-23 ENCOUNTER — Ambulatory Visit: Payer: Medicare Other | Admitting: Cardiovascular Disease

## 2012-04-23 NOTE — Telephone Encounter (Signed)
Spoke with Mark French and notified of recs per MR. She verbalized understanding. Nothing further needed.

## 2012-04-23 NOTE — Telephone Encounter (Signed)
I ordered home program for him but edema is a his cards or primary care issue. They have to adjust his medications. Please let [facility] know

## 2012-04-24 ENCOUNTER — Other Ambulatory Visit: Payer: Self-pay | Admitting: Cardiovascular Disease

## 2012-04-24 ENCOUNTER — Other Ambulatory Visit: Payer: Self-pay | Admitting: Physician Assistant

## 2012-04-25 ENCOUNTER — Telehealth: Payer: Self-pay | Admitting: Cardiovascular Disease

## 2012-04-25 NOTE — Telephone Encounter (Signed)
I spoke with Mark French and made her aware that per Dr Earmon Phoenix most recent office visit (04/09/12) the pt does have chronic lower extremity edema (2+ right/1+ left).  Mark French has not seen the pt in the past few weeks and felt like the edema was new.  I once again made her aware that this is not new and that Dr Excell Seltzer did see this during his evaluation on 04/09/12.  At this time the only recommendation is to have the pt put his feet up when able. I will forward this information to Dr Excell Seltzer for review.

## 2012-04-25 NOTE — Telephone Encounter (Signed)
New Problem:    Called in because he has lost 9 pounds and has +2 edema in his legs, his right leg greater than his left.  Has trouble urinating but once he gets going he is fine.  Please call back to consult about the swelling.

## 2012-04-26 ENCOUNTER — Emergency Department (HOSPITAL_COMMUNITY): Payer: Medicare Other

## 2012-04-26 ENCOUNTER — Observation Stay (HOSPITAL_COMMUNITY)
Admission: EM | Admit: 2012-04-26 | Discharge: 2012-04-28 | DRG: 291 | Disposition: A | Discharge status: Home / Self Care | Payer: Medicare Other | Attending: Internal Medicine | Admitting: Internal Medicine

## 2012-04-26 ENCOUNTER — Encounter (HOSPITAL_COMMUNITY): Payer: Self-pay | Admitting: *Deleted

## 2012-04-26 DIAGNOSIS — E669 Obesity, unspecified: Secondary | ICD-10-CM | POA: Diagnosis present

## 2012-04-26 DIAGNOSIS — N259 Disorder resulting from impaired renal tubular function, unspecified: Secondary | ICD-10-CM | POA: Diagnosis present

## 2012-04-26 DIAGNOSIS — Z8673 Personal history of transient ischemic attack (TIA), and cerebral infarction without residual deficits: Secondary | ICD-10-CM

## 2012-04-26 DIAGNOSIS — J45909 Unspecified asthma, uncomplicated: Secondary | ICD-10-CM

## 2012-04-26 DIAGNOSIS — I872 Venous insufficiency (chronic) (peripheral): Secondary | ICD-10-CM | POA: Diagnosis present

## 2012-04-26 DIAGNOSIS — I509 Heart failure, unspecified: Secondary | ICD-10-CM | POA: Diagnosis present

## 2012-04-26 DIAGNOSIS — E1065 Type 1 diabetes mellitus with hyperglycemia: Secondary | ICD-10-CM | POA: Diagnosis present

## 2012-04-26 DIAGNOSIS — I5023 Acute on chronic systolic (congestive) heart failure: Principal | ICD-10-CM | POA: Diagnosis present

## 2012-04-26 DIAGNOSIS — E119 Type 2 diabetes mellitus without complications: Secondary | ICD-10-CM

## 2012-04-26 DIAGNOSIS — Z951 Presence of aortocoronary bypass graft: Secondary | ICD-10-CM

## 2012-04-26 DIAGNOSIS — I5022 Chronic systolic (congestive) heart failure: Secondary | ICD-10-CM

## 2012-04-26 DIAGNOSIS — N189 Chronic kidney disease, unspecified: Secondary | ICD-10-CM | POA: Diagnosis present

## 2012-04-26 DIAGNOSIS — Z8249 Family history of ischemic heart disease and other diseases of the circulatory system: Secondary | ICD-10-CM

## 2012-04-26 DIAGNOSIS — I25810 Atherosclerosis of coronary artery bypass graft(s) without angina pectoris: Secondary | ICD-10-CM

## 2012-04-26 DIAGNOSIS — E7800 Pure hypercholesterolemia, unspecified: Secondary | ICD-10-CM

## 2012-04-26 DIAGNOSIS — K5900 Constipation, unspecified: Secondary | ICD-10-CM | POA: Diagnosis present

## 2012-04-26 DIAGNOSIS — Z6835 Body mass index (BMI) 35.0-35.9, adult: Secondary | ICD-10-CM

## 2012-04-26 DIAGNOSIS — F329 Major depressive disorder, single episode, unspecified: Secondary | ICD-10-CM

## 2012-04-26 DIAGNOSIS — H548 Legal blindness, as defined in USA: Secondary | ICD-10-CM | POA: Diagnosis present

## 2012-04-26 DIAGNOSIS — K5903 Drug induced constipation: Secondary | ICD-10-CM | POA: Diagnosis present

## 2012-04-26 DIAGNOSIS — E1039 Type 1 diabetes mellitus with other diabetic ophthalmic complication: Secondary | ICD-10-CM | POA: Diagnosis present

## 2012-04-26 DIAGNOSIS — Z833 Family history of diabetes mellitus: Secondary | ICD-10-CM

## 2012-04-26 DIAGNOSIS — J9600 Acute respiratory failure, unspecified whether with hypoxia or hypercapnia: Secondary | ICD-10-CM | POA: Diagnosis present

## 2012-04-26 DIAGNOSIS — E785 Hyperlipidemia, unspecified: Secondary | ICD-10-CM | POA: Diagnosis present

## 2012-04-26 DIAGNOSIS — J4489 Other specified chronic obstructive pulmonary disease: Secondary | ICD-10-CM | POA: Diagnosis present

## 2012-04-26 DIAGNOSIS — I2510 Atherosclerotic heart disease of native coronary artery without angina pectoris: Secondary | ICD-10-CM | POA: Diagnosis present

## 2012-04-26 DIAGNOSIS — E11319 Type 2 diabetes mellitus with unspecified diabetic retinopathy without macular edema: Secondary | ICD-10-CM | POA: Diagnosis present

## 2012-04-26 DIAGNOSIS — M79609 Pain in unspecified limb: Secondary | ICD-10-CM

## 2012-04-26 DIAGNOSIS — R609 Edema, unspecified: Secondary | ICD-10-CM

## 2012-04-26 DIAGNOSIS — J449 Chronic obstructive pulmonary disease, unspecified: Secondary | ICD-10-CM | POA: Diagnosis present

## 2012-04-26 DIAGNOSIS — H543 Unqualified visual loss, both eyes: Secondary | ICD-10-CM | POA: Diagnosis present

## 2012-04-26 DIAGNOSIS — Z8679 Personal history of other diseases of the circulatory system: Secondary | ICD-10-CM

## 2012-04-26 LAB — CBC WITH DIFFERENTIAL/PLATELET
Basophils Absolute: 0 10*3/uL (ref 0.0–0.1)
Eosinophils Relative: 4 % (ref 0–5)
Lymphocytes Relative: 14 % (ref 12–46)
Lymphs Abs: 1.1 10*3/uL (ref 0.7–4.0)
Neutro Abs: 5.6 10*3/uL (ref 1.7–7.7)
Neutrophils Relative %: 72 % (ref 43–77)
Platelets: 169 10*3/uL (ref 150–400)
RBC: 3.76 MIL/uL — ABNORMAL LOW (ref 4.22–5.81)
RDW: 14.6 % (ref 11.5–15.5)
WBC: 7.7 10*3/uL (ref 4.0–10.5)

## 2012-04-26 LAB — COMPREHENSIVE METABOLIC PANEL
ALT: 15 U/L (ref 0–53)
AST: 16 U/L (ref 0–37)
Alkaline Phosphatase: 70 U/L (ref 39–117)
CO2: 29 mEq/L (ref 19–32)
Calcium: 8.9 mg/dL (ref 8.4–10.5)
Chloride: 99 mEq/L (ref 96–112)
GFR calc non Af Amer: 37 mL/min — ABNORMAL LOW (ref >90)
Potassium: 4 mEq/L (ref 3.5–5.1)
Sodium: 137 mEq/L (ref 135–145)

## 2012-04-26 MED ORDER — HEPARIN SODIUM (PORCINE) 5000 UNIT/ML IJ SOLN
5000.0000 [IU] | Freq: Three times a day (TID) | INTRAMUSCULAR | Status: DC
  Administered 2012-04-27 – 2012-04-28 (×4): 5000 [IU] via SUBCUTANEOUS
  Filled 2012-04-26 (×7): qty 1

## 2012-04-26 MED ORDER — INSULIN ASPART 100 UNIT/ML ~~LOC~~ SOLN
0.0000 [IU] | Freq: Three times a day (TID) | SUBCUTANEOUS | Status: DC
  Administered 2012-04-27: 3 [IU] via SUBCUTANEOUS
  Administered 2012-04-27 – 2012-04-28 (×2): 2 [IU] via SUBCUTANEOUS

## 2012-04-26 MED ORDER — POTASSIUM CHLORIDE CRYS ER 20 MEQ PO TBCR
20.0000 meq | EXTENDED_RELEASE_TABLET | Freq: Every day | ORAL | Status: DC
  Administered 2012-04-27 – 2012-04-28 (×2): 20 meq via ORAL
  Filled 2012-04-26 (×2): qty 1

## 2012-04-26 MED ORDER — ISOSORBIDE MONONITRATE ER 30 MG PO TB24
30.0000 mg | ORAL_TABLET | Freq: Every day | ORAL | Status: DC
  Administered 2012-04-27 – 2012-04-28 (×2): 30 mg via ORAL
  Filled 2012-04-26 (×2): qty 1

## 2012-04-26 MED ORDER — HYDROCODONE-ACETAMINOPHEN 5-325 MG PO TABS
1.0000 | ORAL_TABLET | ORAL | Status: DC | PRN
  Administered 2012-04-27 – 2012-04-28 (×4): 1 via ORAL
  Filled 2012-04-26 (×4): qty 1

## 2012-04-26 MED ORDER — ASPIRIN 325 MG PO TABS
325.0000 mg | ORAL_TABLET | Freq: Once | ORAL | Status: AC
  Administered 2012-04-26: 325 mg via ORAL
  Filled 2012-04-26: qty 1

## 2012-04-26 MED ORDER — ATORVASTATIN CALCIUM 40 MG PO TABS: 40.0000 mg | ORAL_TABLET | Freq: Every day | ORAL | Status: DC

## 2012-04-26 MED ORDER — FUROSEMIDE 10 MG/ML IJ SOLN
40.0000 mg | Freq: Two times a day (BID) | INTRAMUSCULAR | Status: DC
  Administered 2012-04-27 – 2012-04-28 (×4): 40 mg via INTRAVENOUS
  Filled 2012-04-26 (×5): qty 4

## 2012-04-26 MED ORDER — SODIUM CHLORIDE 0.9 % IJ SOLN: 3.0000 mL | INTRAMUSCULAR | Status: DC | PRN

## 2012-04-26 MED ORDER — DOCUSATE SODIUM 100 MG PO TABS: 100.0000 mg | ORAL_TABLET | Freq: Two times a day (BID) | ORAL | Status: DC

## 2012-04-26 MED ORDER — ONDANSETRON HCL 4 MG/2ML IJ SOLN: 4.0000 mg | Freq: Four times a day (QID) | INTRAMUSCULAR | Status: DC | PRN

## 2012-04-26 MED ORDER — TIOTROPIUM BROMIDE MONOHYDRATE 18 MCG IN CAPS
18.0000 ug | ORAL_CAPSULE | Freq: Every day | RESPIRATORY_TRACT | Status: DC
  Administered 2012-04-27 – 2012-04-28 (×2): 18 ug via RESPIRATORY_TRACT
  Filled 2012-04-26: qty 5

## 2012-04-26 MED ORDER — ASPIRIN EC 81 MG PO TBEC
81.0000 mg | DELAYED_RELEASE_TABLET | Freq: Every day | ORAL | Status: DC
  Administered 2012-04-27 – 2012-04-28 (×2): 81 mg via ORAL
  Filled 2012-04-26 (×3): qty 1

## 2012-04-26 MED ORDER — FUROSEMIDE 10 MG/ML IJ SOLN
40.0000 mg | Freq: Once | INTRAMUSCULAR | Status: AC
  Administered 2012-04-26: 40 mg via INTRAVENOUS
  Filled 2012-04-26: qty 4

## 2012-04-26 MED ORDER — ATORVASTATIN CALCIUM 40 MG PO TABS
40.0000 mg | ORAL_TABLET | Freq: Every day | ORAL | Status: DC
  Administered 2012-04-27 (×2): 40 mg via ORAL
  Filled 2012-04-26 (×3): qty 1

## 2012-04-26 MED ORDER — FLUTICASONE-SALMETEROL 100-50 MCG/DOSE IN AEPB
1.0000 | INHALATION_SPRAY | Freq: Two times a day (BID) | RESPIRATORY_TRACT | Status: DC
  Administered 2012-04-27 – 2012-04-28 (×3): 1 via RESPIRATORY_TRACT
  Filled 2012-04-26: qty 14

## 2012-04-26 MED ORDER — SODIUM CHLORIDE 0.9 % IV SOLN
250.0000 mL | INTRAVENOUS | Status: DC | PRN
Start: 2012-04-26 — End: 2012-04-28

## 2012-04-26 MED ORDER — DOCUSATE SODIUM 100 MG PO CAPS
100.0000 mg | ORAL_CAPSULE | Freq: Two times a day (BID) | ORAL | Status: DC
  Administered 2012-04-27 – 2012-04-28 (×3): 100 mg via ORAL
  Filled 2012-04-26 (×4): qty 1

## 2012-04-26 MED ORDER — CLOPIDOGREL BISULFATE 75 MG PO TABS
75.0000 mg | ORAL_TABLET | Freq: Every day | ORAL | Status: DC
  Administered 2012-04-27 – 2012-04-28 (×2): 75 mg via ORAL
  Filled 2012-04-26 (×3): qty 1

## 2012-04-26 MED ORDER — ALBUTEROL SULFATE HFA 108 (90 BASE) MCG/ACT IN AERS
2.0000 | INHALATION_SPRAY | Freq: Four times a day (QID) | RESPIRATORY_TRACT | Status: DC | PRN
  Filled 2012-04-26: qty 6.7

## 2012-04-26 MED ORDER — MORPHINE SULFATE 4 MG/ML IJ SOLN: 4.0000 mg | INTRAMUSCULAR | Status: DC | PRN

## 2012-04-26 MED ORDER — HYDROCODONE-ACETAMINOPHEN 5-325 MG PO TABS
2.0000 | ORAL_TABLET | Freq: Once | ORAL | Status: AC
  Administered 2012-04-26: 2 via ORAL
  Filled 2012-04-26: qty 2

## 2012-04-26 MED ORDER — SODIUM CHLORIDE 0.9 % IJ SOLN
3.0000 mL | Freq: Two times a day (BID) | INTRAMUSCULAR | Status: DC
  Administered 2012-04-27: 3 mL via INTRAVENOUS

## 2012-04-26 MED ORDER — SPIRONOLACTONE 12.5 MG HALF TABLET
12.5000 mg | ORAL_TABLET | Freq: Every day | ORAL | Status: DC
  Administered 2012-04-27 – 2012-04-28 (×2): 12.5 mg via ORAL
  Filled 2012-04-26 (×2): qty 1

## 2012-04-26 MED ORDER — TAMSULOSIN HCL 0.4 MG PO CAPS
0.4000 mg | ORAL_CAPSULE | Freq: Every day | ORAL | Status: DC
  Administered 2012-04-27 – 2012-04-28 (×2): 0.4 mg via ORAL
  Filled 2012-04-26 (×2): qty 1

## 2012-04-26 MED ORDER — ONDANSETRON HCL 4 MG PO TABS: 4.0000 mg | ORAL_TABLET | Freq: Four times a day (QID) | ORAL | Status: DC | PRN

## 2012-04-26 MED ORDER — INSULIN NPH (HUMAN) (ISOPHANE) 100 UNIT/ML ~~LOC~~ SUSP
60.0000 [IU] | Freq: Every day | SUBCUTANEOUS | Status: DC
  Administered 2012-04-27 – 2012-04-28 (×2): 60 [IU] via SUBCUTANEOUS
  Filled 2012-04-26: qty 10

## 2012-04-26 MED ORDER — SODIUM CHLORIDE 0.9 % IJ SOLN
3.0000 mL | Freq: Two times a day (BID) | INTRAMUSCULAR | Status: DC
  Administered 2012-04-27 – 2012-04-28 (×3): 3 mL via INTRAVENOUS

## 2012-04-26 MED ORDER — INSULIN ASPART 100 UNIT/ML ~~LOC~~ SOLN
0.0000 [IU] | Freq: Every day | SUBCUTANEOUS | Status: DC
  Administered 2012-04-27: 2 [IU] via SUBCUTANEOUS

## 2012-04-26 MED ORDER — CARVEDILOL 6.25 MG PO TABS
6.2500 mg | ORAL_TABLET | Freq: Two times a day (BID) | ORAL | Status: DC
  Administered 2012-04-27 – 2012-04-28 (×3): 6.25 mg via ORAL
  Filled 2012-04-26 (×5): qty 1

## 2012-04-26 MED ORDER — GABAPENTIN 600 MG PO TABS
600.0000 mg | ORAL_TABLET | Freq: Two times a day (BID) | ORAL | Status: DC
  Administered 2012-04-27 – 2012-04-28 (×4): 600 mg via ORAL
  Filled 2012-04-26 (×5): qty 1

## 2012-04-26 MED ORDER — PANTOPRAZOLE SODIUM 40 MG PO TBEC
40.0000 mg | DELAYED_RELEASE_TABLET | Freq: Every day | ORAL | Status: DC
  Administered 2012-04-27 – 2012-04-28 (×2): 40 mg via ORAL
  Filled 2012-04-26 (×2): qty 1

## 2012-04-26 NOTE — Telephone Encounter (Signed)
agreed

## 2012-04-26 NOTE — ED Notes (Signed)
Legs wrapped due to weeping per patient.  Edema noted to same.

## 2012-04-26 NOTE — ED Notes (Signed)
Calling report now. 

## 2012-04-26 NOTE — ED Notes (Signed)
Report given to floor Angie, RN. No further questions from RN. Preparing to bring pt up.

## 2012-04-26 NOTE — ED Notes (Signed)
Pt informed that meal will held until further test results come back. Pt verbalized understanding.

## 2012-04-26 NOTE — ED Provider Notes (Signed)
History     CSN: [id number]  Arrival date & time 04/26/12  1726   First MD Initiated Contact with Patient 04/26/12 1738      Chief Complaint  Patient presents with  . Shortness of Breath    (Consider location/radiation/quality/duration/timing/severity/associated sxs/prior treatment) The history is provided by the patient.  Mark French is a 66 y.o. male hx of DM, HTN, CHF (EF 45%), blindness here with SOB. SOB worsening in the last week, worse with exertion. Today, was counting coins and suddenly had worsening SOB. No CP, noticed that legs are more swollen in the last week. He has several admissions this year for heart failure. No fever or cough.    Past Medical History  Diagnosis Date  . CORONARY ARTERY DISEASE 03/11/2007    s/p CABG 1998;  admitted 08/2011 with an inferior STEMI.  LHC 09/07/11: Severe  Three-vessel CAD, LIMA-LAD patent, SVG-RCA chronically occluded, SVG-circumflex chronically occluded, SVG-diagonal 95%.  PCI: Promus DES to the SVG-diagonal.  Procedure c/b transient CHB req CPR and TTVP;  myoview 4/13:large severe inferolateral infarct, no ischemia, EF 25%    . DIABETES MELLITUS, TYPE I 03/11/2007  . HYPERTENSION 03/11/2007  . Proliferative diabetic retinopathy 03/09/2009  . HYPERCHOLESTEROLEMIA 09/28/2008  . CERVICAL RADICULOPATHY, LEFT 09/23/2008  . ANEMIA-IRON DEFICIENCY 09/23/2008  . PERIPHERAL NEUROPATHY 09/23/2008  . Chronic systolic heart failure 09/23/2008    Echocardiogram 09/07/11: Difficult study, inferior and posterior lateral wall hypokinesis, mild LVH, EF 45%, mild LAE.   [name] RENAL INSUFFICIENCY 03/15/2010  . PERIPHERAL VASCULAR INSUFFICIENCY,  LEGS, BILATERAL 08/17/2010    ABI's performed 04/19/11 R 0.72 L 0.71  . ED (erectile dysfunction)   . Hypogonadism male   . GI bleed 2012    Multiple Duodenal ulcer   . Obesity   . Legally blind     bilaterally  . Stroke 89    age 37 months; residual right sided weakness  . History of blood transfusion 2012  . Charcot  foot due to diabetes mellitus     chronic pain  . Osteoarthritis     right hip and shoulder  . Myocardial infarction   . COPD (chronic obstructive pulmonary disease)     Past Surgical History  Procedure Date  . Carpal tunnel release     left  . Sigmoidoscopy 03/11/2001  . Venous doppler 01/30/2004  . Eye surgery   . Fracture surgery   . Tonsillectomy and adenoidectomy     "as a kid"  . Cataract extraction w/ intraocular lens  implant, bilateral   . Coronary artery bypass graft 1998    CABG X5  . Coronary angioplasty with stent placement 08/2011    "1"  . Retinopathy surgery 2000's    "laser; both eyes"  . Wrist fusion 1976    right  . Anterior fusion cervical spine 2010    "C spine; Dr. Yetta Barre"  . Ankle fracture surgery 1976; ?date    LEFT:  fused; removed bulk of hardware  . Heel spur surgery ~ 2007    ? left  . Irrigation and debridement knee 02/27/2012    Procedure: IRRIGATION AND DEBRIDEMENT KNEE;  Surgeon: Verlee Rossetti, MD;  Location: [facility] OR;  Service: Orthopedics;  Laterality: Right;  irrigation and drainage right knee septic bursitis    Family History  Problem Relation Age of Onset  . Heart disease Mother     Coronary Artery Disease  . Heart disease Father     Coronary Artery Disease  . Heart  disease Paternal Grandmother     Coronary Artery Disease  . Colon cancer Neg Hx   . Diabetes Other     Grandmother    History  Substance Use Topics  . Smoking status: Never Smoker   . Smokeless tobacco: Never Used  . Alcohol Use: No      Review of Systems  Respiratory: Positive for shortness of breath.   All other systems reviewed and are negative.    Allergies  Sildenafil and Contrast media  Home Medications   Current Outpatient Rx  Name Route Sig Dispense Refill  . ALBUTEROL SULFATE HFA 108 (90 BASE) MCG/ACT IN AERS Inhalation Inhale 2 puffs into the lungs every 6 (six) hours as needed. For shortness of breath    . ASPIRIN EC 325 MG PO TBEC Oral  Take 81 mg by mouth daily.    . ATORVASTATIN CALCIUM 40 MG PO TABS Oral Take 1 tablet (40 mg total) by mouth daily. 30 tablet 11  . CARVEDILOL 6.25 MG PO TABS Oral Take 6.25 mg by mouth 2 (two) times daily with a meal.    . CLOPIDOGREL BISULFATE 75 MG PO TABS Oral Take 75 mg by mouth daily with breakfast.    . DOCUSATE SODIUM 100 MG PO TABS Oral Take 100 mg by mouth daily as needed. constipation    . FLUTICASONE-SALMETEROL 100-50 MCG/DOSE IN AEPB Inhalation Inhale 1 puff into the lungs 2 (two) times daily. 1 each 11  . FUROSEMIDE 80 MG PO TABS Oral Take 80 mg by mouth 2 (two) times daily.     [name] GABAPENTIN 600 MG PO TABS Oral Take 1 tablet (600 mg total) by mouth 2 (two) times daily. 60 tablet 11  . HYDROCODONE-ACETAMINOPHEN 10-325 MG PO TABS Oral Take 1 tablet by mouth every 4 (four) hours as needed. For pain 120 tablet 5  . INSULIN ISOPHANE HUMAN 100 UNIT/ML [place] SUSP Subcutaneous Inject 60 Units into the skin every morning. 20 mL 11  . ISOSORBIDE MONONITRATE ER 30 MG PO TB24 Oral Take 30 mg by mouth daily.    [name] LOSARTAN POTASSIUM 50 MG PO TABS Oral Take 50 mg by mouth daily.    [name] PANTOPRAZOLE SODIUM 40 MG PO TBEC Oral Take 40 mg by mouth daily.    [name] POTASSIUM CHLORIDE CRYS ER 20 MEQ PO TBCR Oral Take 20 mEq by mouth daily.    [name] SPIRONOLACTONE 25 MG PO TABS Oral Take 12.5 mg by mouth daily.    [name] TAMSULOSIN HCL 0.4 MG PO CAPS Oral Take 0.4 mg by mouth daily.    [name] TIOTROPIUM BROMIDE MONOHYDRATE 18 MCG IN CAPS Inhalation Place 1 capsule (18 mcg total) into inhaler and inhale daily. 30 capsule 6    BP 101/40  Temp 98.3 F (36.8 C) (Oral)  Resp 14  SpO2 100%  Physical Exam  Nursing note and vitals reviewed. Constitutional: He is oriented to person, place, and time.       Chronically ill, NAD  HENT:  Head: Normocephalic.  Mouth/Throat: Oropharynx is clear and moist.  Eyes: Conjunctivae normal are normal. Pupils are equal, round, and reactive to light.  Neck: Normal range of motion. Neck  supple.  Cardiovascular: Normal rate, regular rhythm and normal heart sounds.   Pulmonary/Chest: Effort normal.       Bilateral crackles  Abdominal: Soft. Bowel sounds are normal.       obese  Musculoskeletal: Normal range of motion.       2+ edema  Neurological: He  is alert and oriented to person, place, and time.  Skin: Skin is warm and dry.       There are stage 2 ulcers on his bilateral shins, mild erythema on the R shin ulcer.   Psychiatric: He has a normal mood and affect. His behavior is normal. Judgment and thought content normal.    ED Course  Procedures (including critical care time)  Labs Reviewed  CBC WITH DIFFERENTIAL - Abnormal; Notable for the following:    RBC 3.76 (*)     Hemoglobin 10.2 (*)     HCT 32.0 (*)     All other components within normal limits  COMPREHENSIVE METABOLIC PANEL - Abnormal; Notable for the following:    BUN 37 (*)     Creatinine, Ser 1.79 (*)     Albumin 3.4 (*)     Total Bilirubin 0.2 (*)     GFR calc non Af Amer 37 (*)     GFR calc Af Amer 43 (*)     All other components within normal limits  PRO B NATRIURETIC PEPTIDE - Abnormal; Notable for the following:    Pro B Natriuretic peptide (BNP) 788.0 (*)     All other components within normal limits  TROPONIN I   Dg Chest 2 View  04/26/2012  *RADIOLOGY REPORT*  Clinical Data: Shortness of breath, leg swelling  CHEST - 2 VIEW  Comparison: 04/04/2012  Findings: Cardiomegaly.  No frank interstitial edema.  Patchy bilateral lower lobe opacities, likely atelectasis. No pleural effusion or pneumothorax.  Postsurgical changes related to prior CABG.  Degenerative changes of the visualized thoracolumbar spine.  IMPRESSION: No frank interstitial edema.  Patchy bilateral lower lobe opacities, likely atelectasis.  Cardiomegaly.   Original Report Authenticated By: Charline Bills, M.D.    Dg Tibia/fibula Left  04/26/2012  *RADIOLOGY REPORT*  Clinical Data: Left leg ulcer, rule out osteomyelitis  LEFT  TIBIA AND FIBULA - 2 VIEW  Comparison: None.  Findings: Two views of the left tibia-fibula submitted.  No acute fracture or subluxation.  No periosteal reaction or bony erosion to suggest osteomyelitis.  Soft tissue irregularity is noted mid calf region peripheral.  IMPRESSION: No acute fracture or subluxation.  No periosteal reaction or bony erosion to suggest osteomyelitis.   Original Report Authenticated By: Natasha Mead, M.D.    Dg Tibia/fibula Right  04/26/2012  *RADIOLOGY REPORT*  Clinical Data: Right leg ulcer, rule out osteomyelitis  RIGHT TIBIA AND FIBULA - 2 VIEW  Comparison: None.  Findings: Two views of the right tibia-fibula submitted.  No acute fracture or subluxation.  No periosteal reaction or bony erosion. Medial soft tissue surgical staples are noted.  IMPRESSION: No acute fracture or subluxation.  No periosteal reaction or bony erosion.   Original Report Authenticated By: Natasha Mead, M.D.      No diagnosis found.   Date: 04/26/2012  Rate: 82  Rhythm: normal sinus rhythm  QRS Axis: normal  Intervals: normal  ST/T Wave abnormalities: nonspecific ST changes  Conduction Disutrbances:right bundle branch block and left posterior fascicular block  Narrative Interpretation:   Old EKG Reviewed: unchanged    MDM  Mark French is a 66 y.o. male hx of CHF, HTN, DM here with SOB. He is likely to have CHF exacerbation. Will get labs, BNP, trop, and give lasix and admit for CHF exacerbation.   9:22 PM Patient's labs showed BNP 780 (elevated from prior), Cr 1.79 (baseline 1.3) CXR showed ateletasis at basis. He was given lasix 40mg   IV and admitted for CHF exacerbation.        Richardean Canal, MD 04/26/12 2123

## 2012-04-26 NOTE — ED Notes (Addendum)
EMS called for shortness of breath- Patient speaking in full sentences on arrival-Sat's 96% on 4L- Patient denies chest pain, shortness of breath, N/V, diaphoresis during this episode of shortness of breathe.

## 2012-04-26 NOTE — ED Notes (Addendum)
Report received from [place], [place]. Pt does not appear to be in acute distress. Pt is requesting a meal. Pt c/o 10/10 pain in both right and left legs due to hx of neuropathy and states he usually takes hydrocodone and gabapentin for pain about this time of day.

## 2012-04-26 NOTE — H&P (Signed)
[facility] History and Physical  Mark French JXB:[id number] DOB: 1946-01-16    PCP:   [name], MD   Chief Complaint: Shortness of breath and pedal edema.  HPI: Mark French is an 66 y.o. male with known CAD s/p prior CABG, with last EF 25% via myoview 4/13, recent MI about 7 months ago ([place]), Hx of HTN, DM, GI bleed, DM retinopathy, CKD with Cr 1.2-1.4, anemia, chronic pedal edema, COPD ([place]), presents to ER with increase DOE, SOB, and increase bilateral pedal edema.  His PMD had increase PO lasix but it has not been improving.  Evaluation in the ER with EKG showed LAFB, RBBB, with V1 adn V2 showing ST segment depression as comparing to the old EKG.  His troponin was negative.  His ProBNP was 700 (prior 200), his Cr 1.7, and CXR showed no frank edema, and no definite infiltrates, but cardiomegaly.  He had foot ulcer and X-rays were done which did not show any evidence of osteomyelitis.  He also complaint of left shoulder pain, with no trauma.  Hospitalist was asked to admit him for acute on chronic CHF.  Rewiew of Systems:  Constitutional: Negative for malaise, fever and chills. No significant weight loss or weight gain Eyes: Negative for eye pain, redness and discharge, diplopia, visual changes, or flashes of light. ENMT: Negative for ear pain, hoarseness, nasal congestion, sinus pressure and sore throat. No headaches; tinnitus, drooling, or problem swallowing. Cardiovascular: Negative for chest pain, palpitations, diaphoresis, . ; No orthopnea, PND Respiratory: Negative for cough, hemoptysis, wheezing and stridor. No pleuritic chestpain. Gastrointestinal: Negative for nausea, vomiting, diarrhea, constipation, abdominal pain, melena, blood in stool, hematemesis, jaundice and rectal bleeding.    Genitourinary: Negative for frequency, dysuria, incontinence,flank pain and hematuria; Musculoskeletal: Negative for back pain and neck pain. Negative for swelling and  trauma.; has left shoulder pain. Skin: . Negative for pruritus, rash, abrasions, bruising and skin lesion.; ulcerations Neuro: Negative for headache, lightheadedness and neck stiffness. Negative for weakness, altered level of consciousness , altered mental status, extremity weakness, burning feet, involuntary movement, seizure and syncope.  Psych: negative for anxiety, depression, insomnia, tearfulness, panic attacks, hallucinations, paranoia, suicidal or homicidal ideation    Past Medical History  Diagnosis Date  . CORONARY ARTERY DISEASE 03/11/2007    s/p CABG 1998;  admitted 08/2011 with an inferior STEMI.  LHC 09/07/11: Severe  Three-vessel CAD, LIMA-LAD patent, SVG-RCA chronically occluded, SVG-circumflex chronically occluded, SVG-diagonal 95%.  PCI: Promus DES to the SVG-diagonal.  Procedure c/b transient CHB req CPR and TTVP;  myoview 4/13:large severe inferolateral infarct, no ischemia, EF 25%    . DIABETES MELLITUS, TYPE I 03/11/2007  . HYPERTENSION 03/11/2007  . Proliferative diabetic retinopathy 03/09/2009  . HYPERCHOLESTEROLEMIA 09/28/2008  . CERVICAL RADICULOPATHY, LEFT 09/23/2008  . PERIPHERAL NEUROPATHY 09/23/2008  . Chronic systolic heart failure 09/23/2008    Echocardiogram 09/07/11: Difficult study, inferior and posterior lateral wall hypokinesis, mild LVH, EF 45%, mild LAE.   [name] RENAL INSUFFICIENCY 03/15/2010  . ED (erectile dysfunction)   . Hypogonadism male   . GI bleed 2012    Multiple Duodenal ulcer   . Obesity   . Legally blind     bilaterally  . Stroke 89    age 17 months; residual right sided weakness  . History of blood transfusion 2012  . Charcot foot due to diabetes mellitus     chronic pain  . Osteoarthritis     right hip and shoulder  . COPD (chronic obstructive  pulmonary disease)   . Myocardial infarction 2013b Jan  . PERIPHERAL VASCULAR INSUFFICIENCY,  LEGS, BILATERAL 08/17/2010    ABI's performed 04/19/11 R 0.72 L 0.71  . ANEMIA-IRON DEFICIENCY 09/23/2008    ulcer  related last year    Past Surgical History  Procedure Date  . Carpal tunnel release     left  . Sigmoidoscopy 03/11/2001  . Venous doppler 01/30/2004  . Tonsillectomy and adenoidectomy     "as a kid"  . Cataract extraction w/ intraocular lens  implant, bilateral   . Coronary angioplasty with stent placement 08/2011    "1"  . Retinopathy surgery 2000's    "laser; both eyes"  . Wrist fusion 1976    right  . Anterior fusion cervical spine 2010    "C spine; Dr. Yetta Barre"  . Ankle fracture surgery 1976; ?date    LEFT:  fused; removed bulk of hardware  . Heel spur surgery ~ 2007    ? left  . Irrigation and debridement knee 02/27/2012    Procedure: IRRIGATION AND DEBRIDEMENT KNEE;  Surgeon: Verlee Rossetti, MD;  Location: [facility] OR;  Service: Orthopedics;  Laterality: Right;  irrigation and drainage right knee septic bursitis  . Eye surgery 2008    cataract removal, cautery  . Coronary artery bypass graft 1998    CABG X5  . Fracture surgery     foot broken 1976    Medications:  HOME MEDS: Prior to Admission medications   Medication Sig Start Date End Date Taking? Authorizing Provider  albuterol (PROVENTIL HFA;VENTOLIN HFA) 108 (90 BASE) MCG/ACT inhaler Inhale 2 puffs into the lungs every 6 (six) hours as needed. For shortness of breath   Yes Historical Provider, MD  aspirin EC 325 MG tablet Take 81 mg by mouth daily.   Yes Historical Provider, MD  atorvastatin (LIPITOR) 40 MG tablet Take 1 tablet (40 mg total) by mouth daily. 04/09/12 04/09/13 Yes Tonny Bollman, MD  carvedilol (COREG) 6.25 MG tablet Take 6.25 mg by mouth 2 (two) times daily with a meal. 11/16/11 11/15/12 Yes [name], MD  clopidogrel (PLAVIX) 75 MG tablet Take 75 mg by mouth daily with breakfast. 11/16/11 11/15/12 Yes [name], MD  Docusate Sodium (STOOL SOFTENER) 100 MG capsule Take 100 mg by mouth daily as needed. constipation   Yes Historical Provider, MD  Fluticasone-Salmeterol (ADVAIR DISKUS) 100-50 MCG/DOSE AEPB  Inhale 1 puff into the lungs 2 (two) times daily. 12/25/11  Yes [name], MD  furosemide (LASIX) 80 MG tablet Take 80 mg by mouth 2 (two) times daily.    Yes Historical Provider, MD  gabapentin (NEURONTIN) 600 MG tablet Take 1 tablet (600 mg total) by mouth 2 (two) times daily. 04/16/12 04/16/13 Yes [name], MD  HYDROcodone-acetaminophen (NORCO) 10-325 MG per tablet Take 1 tablet by mouth every 4 (four) hours as needed. For pain 04/16/12  Yes [name], MD  insulin NPH (HUMULIN N) 100 UNIT/ML injection Inject 60 Units into the skin every morning. 04/20/12  Yes [name], MD  isosorbide mononitrate (IMDUR) 30 MG 24 hr tablet Take 30 mg by mouth daily. 01/13/12 01/12/13 Yes Dayna N Dunn, PA  losartan (COZAAR) 50 MG tablet Take 50 mg by mouth daily.   Yes Historical Provider, MD  pantoprazole (PROTONIX) 40 MG tablet Take 40 mg by mouth daily. 01/13/12 01/12/13 Yes Dayna N Dunn, PA  potassium chloride SA (K-DUR,KLOR-CON) 20 MEQ tablet Take 20 mEq by mouth daily.   Yes Historical Provider, MD  spironolactone (ALDACTONE)  25 MG tablet Take 12.5 mg by mouth daily. 09/15/11  Yes Rhonda G Barrett, PA  Tamsulosin HCl (FLOMAX) 0.4 MG CAPS Take 0.4 mg by mouth daily. 09/15/11  Yes Rhonda G Barrett, PA  tiotropium (SPIRIVA) 18 MCG inhalation capsule Place 1 capsule (18 mcg total) into inhaler and inhale daily. 03/15/12 03/15/13 Yes Kalman Shan, MD     Allergies:  Allergies  Allergen Reactions  . Sildenafil Nausea Only and Other (See Comments)    "took it once; heart went to pieces; never took it again"  . Contrast Media (Iodinated Diagnostic Agents) Nausea Only    Social History:   reports that he has never smoked. He has never used smokeless tobacco. He reports that he does not drink alcohol or use illicit drugs.  Family History: Family History  Problem Relation Age of Onset  . Heart disease Mother     Coronary Artery Disease  . Heart disease Father     Coronary Artery Disease  . Heart disease  Paternal Grandmother     Coronary Artery Disease  . Colon cancer Neg Hx   . Diabetes Other     Grandmother     Physical Exam: Filed Vitals:   04/26/12 2037 04/26/12 2130 04/26/12 2200 04/26/12 2245  BP: 101/40 106/48 107/48 106/54  Pulse:  59 64 64  Temp:    98.6 F (37 C)  TempSrc:    Oral  Resp: 14 13 25 18   Height:    5\' 6"  (1.676 m)  Weight:    100.88 kg (222 lb 6.4 oz)  SpO2: 100% 100% 100% 100%   Blood pressure 106/54, pulse 64, temperature 98.6 F (37 C), temperature source Oral, resp. rate 18, height 5\' 6"  (1.676 m), weight 100.88 kg (222 lb 6.4 oz), SpO2 100.00%.  GEN:  Pleasant  patient lying in the stretcher in no acute distress; cooperative with exam. PSYCH:  alert and oriented x4; does not appear anxious or depressed; affect is appropriate. HEENT: Mucous membranes pink and anicteric; PERRLA; EOM intact; no cervical lymphadenopathy nor thyromegaly or carotid bruit; no JVD; There were no stridor. Neck is very supple. Breasts:: Not examined CHEST WALL: No tenderness CHEST: Normal respiration, clear to auscultation bilaterally.  HEART: Regular rate and rhythm.  There are no murmur, rub, or gallops.   BACK: No kyphosis or scoliosis; no CVA tenderness ABDOMEN: soft and non-tender; no masses, no organomegaly, normal abdominal bowel sounds; no pannus; no intertriginous candida. There is no rebound and no distention. Rectal Exam: Not done EXTREMITIES: No bone or joint deformity; age-appropriate arthropathy of the hands and knees;no ulcerations.  There is no calf tenderness. He has bilateral leg ulcers and with edema.  He has point tenderness over the superior aspect of his rotator cuff. Genitalia: not examined PULSES: 2+ and symmetric SKIN: Normal hydration no rash or ulceration CNS: Cranial nerves 2-12 grossly intact no focal lateralizing neurologic deficit.  Speech is fluent; uvula elevated with phonation, facial symmetry and tongue midline. DTR are normal bilaterally,  cerebella exam is intact, barbinski is negative and strengths are equaled bilaterally.  No sensory loss.   Labs on Admission:  Basic Metabolic Panel:  Lab 04/26/12 2841  NA 137  K 4.0  CL 99  CO2 29  GLUCOSE 94  BUN 37*  CREATININE 1.79*  CALCIUM 8.9  MG --  PHOS --   Liver Function Tests:  Lab 04/26/12 1751  AST 16  ALT 15  ALKPHOS 70  BILITOT 0.2*  PROT 6.9  ALBUMIN 3.4*   No results found for this basename: LIPASE:5,AMYLASE:5 in the last 168 hours No results found for this basename: AMMONIA:5 in the last 168 hours CBC:  Lab 04/26/12 1751  WBC 7.7  NEUTROABS 5.6  HGB 10.2*  HCT 32.0*  MCV 85.1  PLT 169   Cardiac Enzymes:  Lab 04/26/12 1752  CKTOTAL --  CKMB --  CKMBINDEX --  TROPONINI <0.30    CBG: No results found for this basename: GLUCAP:5 in the last 168 hours   Radiological Exams on Admission: Dg Chest 2 View  04/26/2012  *RADIOLOGY REPORT*  Clinical Data: Shortness of breath, leg swelling  CHEST - 2 VIEW  Comparison: 04/04/2012  Findings: Cardiomegaly.  No frank interstitial edema.  Patchy bilateral lower lobe opacities, likely atelectasis. No pleural effusion or pneumothorax.  Postsurgical changes related to prior CABG.  Degenerative changes of the visualized thoracolumbar spine.  IMPRESSION: No frank interstitial edema.  Patchy bilateral lower lobe opacities, likely atelectasis.  Cardiomegaly.   Original Report Authenticated By: Charline Bills, M.D.    Dg Tibia/fibula Left  04/26/2012  *RADIOLOGY REPORT*  Clinical Data: Left leg ulcer, rule out osteomyelitis  LEFT TIBIA AND FIBULA - 2 VIEW  Comparison: None.  Findings: Two views of the left tibia-fibula submitted.  No acute fracture or subluxation.  No periosteal reaction or bony erosion to suggest osteomyelitis.  Soft tissue irregularity is noted mid calf region peripheral.  IMPRESSION: No acute fracture or subluxation.  No periosteal reaction or bony erosion to suggest osteomyelitis.   Original  Report Authenticated By: Natasha Mead, M.D.    Dg Tibia/fibula Right  04/26/2012  *RADIOLOGY REPORT*  Clinical Data: Right leg ulcer, rule out osteomyelitis  RIGHT TIBIA AND FIBULA - 2 VIEW  Comparison: None.  Findings: Two views of the right tibia-fibula submitted.  No acute fracture or subluxation.  No periosteal reaction or bony erosion. Medial soft tissue surgical staples are noted.  IMPRESSION: No acute fracture or subluxation.  No periosteal reaction or bony erosion.   Original Report Authenticated By: Natasha Mead, M.D.     EKG: Independently reviewed. LAFB and RBBB, with V1 V2 ST segment depressions.   Assessment/Plan Present on Admission:  .BLINDNESS .Acute on chronic systolic heart failure .Constipation due to pain medication .HYPERCHOLESTEROLEMIA .Edema .DM2 (diabetes mellitus, type 2) .RENAL INSUFFICIENCY   PLAN: He is admitted for acute on chronic CHF.  He does have very low EF.  He also has CKD with Cr up to 1.7 from 1.2, so we will follow it closely and careful with diuresis.  I will hold his Lorsartan for now.  Will give Lasix 40 IV every 12 hours.  I have continued his other medications for his ischemic cardiomyopathy including Coreg and Isosorbide mononitrate.  For his DM, will institute SSI.  Will r/out with troponins and obtain a repeat ECHO.  I will obtain a Xray of his left shoulder to exclude the unlikely event of any fx, but suspect he has rotator cuff tendenitis.  He is stable, full code, and will be admitted to [facility] service.  Other plans as per orders.  Code Status: FULL Unk Lightning, MD. [facility] Pager [phone] 7pm to 7am.  04/26/2012, 11:41 PM

## 2012-04-27 ENCOUNTER — Inpatient Hospital Stay (HOSPITAL_COMMUNITY): Payer: Medicare Other

## 2012-04-27 DIAGNOSIS — F329 Major depressive disorder, single episode, unspecified: Secondary | ICD-10-CM

## 2012-04-27 DIAGNOSIS — I2510 Atherosclerotic heart disease of native coronary artery without angina pectoris: Secondary | ICD-10-CM

## 2012-04-27 LAB — GLUCOSE, CAPILLARY
Glucose-Capillary: 224 mg/dL — ABNORMAL HIGH (ref 70–99)
Glucose-Capillary: 89 mg/dL (ref 70–99)
Glucose-Capillary: 157 mg/dL — ABNORMAL HIGH (ref 70–99)
Glucose-Capillary: 186 mg/dL — ABNORMAL HIGH (ref 70–99)

## 2012-04-27 LAB — CBC
HCT: 33.4 % — ABNORMAL LOW (ref 39.0–52.0)
HCT: 34.1 % — ABNORMAL LOW (ref 39.0–52.0)
Hemoglobin: 10.7 g/dL — ABNORMAL LOW (ref 13.0–17.0)
MCH: 26.5 pg (ref 26.0–34.0)
MCH: 26.7 pg (ref 26.0–34.0)
MCHC: 31.1 g/dL (ref 30.0–36.0)
MCV: 85 fL (ref 78.0–100.0)
MCV: 85 fL (ref 78.0–100.0)
RBC: 4.01 MIL/uL — ABNORMAL LOW (ref 4.22–5.81)
RDW: 14.6 % (ref 11.5–15.5)

## 2012-04-27 LAB — COMPREHENSIVE METABOLIC PANEL
ALT: 14 U/L (ref 0–53)
AST: 15 U/L (ref 0–37)
Albumin: 3.3 g/dL — ABNORMAL LOW (ref 3.5–5.2)
Alkaline Phosphatase: 71 U/L (ref 39–117)
Potassium: 3.6 mEq/L (ref 3.5–5.1)
Sodium: 132 mEq/L — ABNORMAL LOW (ref 135–145)
Total Protein: 6.9 g/dL (ref 6.0–8.3)

## 2012-04-27 LAB — BASIC METABOLIC PANEL
CO2: 29 mEq/L (ref 19–32)
Calcium: 9.3 mg/dL (ref 8.4–10.5)
Glucose, Bld: 76 mg/dL (ref 70–99)
Potassium: 3.8 mEq/L (ref 3.5–5.1)
Sodium: 140 mEq/L (ref 135–145)

## 2012-04-27 LAB — CBC WITH DIFFERENTIAL/PLATELET
Basophils Absolute: 0 10*3/uL (ref 0.0–0.1)
Basophils Relative: 0 % (ref 0–1)
Eosinophils Absolute: 0.4 10*3/uL (ref 0.0–0.7)
Lymphs Abs: 1.1 10*3/uL (ref 0.7–4.0)
MCH: 27.3 pg (ref 26.0–34.0)
Neutrophils Relative %: 70 % (ref 43–77)
Platelets: 171 10*3/uL (ref 150–400)
RBC: 3.99 MIL/uL — ABNORMAL LOW (ref 4.22–5.81)

## 2012-04-27 LAB — TROPONIN I: Troponin I: <0.3 ng/mL (ref <0.30)

## 2012-04-27 MED ORDER — SODIUM CHLORIDE 0.9 % IV SOLN
INTRAVENOUS | Status: DC
  Filled 2012-04-27 (×2): qty 1000

## 2012-04-27 MED ORDER — HYDROMORPHONE HCL PF 1 MG/ML IJ SOLN
0.5000 mg | INTRAMUSCULAR | Status: DC | PRN
  Administered 2012-04-27 (×3): 0.5 mg via INTRAVENOUS
  Filled 2012-04-27 (×3): qty 1

## 2012-04-27 MED ORDER — CHLORHEXIDINE GLUCONATE 4 % EX LIQD: 60.0000 mL | Freq: Once | CUTANEOUS | Status: DC

## 2012-04-27 NOTE — Progress Notes (Signed)
Received admission from ED.  Patient transported via stretcher, accompanied by wife and NA, with IV access on left hand, no other contraptions noted.  Patient not in respiratory distress, but complains of pain in lower extremities.  Ulcers, diabetic vs venous stasis noted on both legs (see wound assessment) present on admission.  Oriented pt to hospital and unit procedures and schedule of rounds.  Patient refused to stay on bed, verbalized that he prefers to stay on the chair.  Kept call bell within reach, instructed to call for concerns and assistance.  Will continue to monitor and evaluate patient. [facility] on duty aware of admission.

## 2012-04-27 NOTE — Progress Notes (Signed)
Subjective: Breathing better.  Complaining of pain in his lower extremities bilaterally.  Objective: Vital signs in last 24 hours: Filed Vitals:   04/26/12 2200 04/26/12 2245 04/27/12 0140 04/27/12 0515  BP: 107/48 106/54 114/54 111/64  Pulse: 64 64 66 59  Temp:  98.6 F (37 C) 97.1 F (36.2 C) 98.2 F (36.8 C)  TempSrc:  Oral Oral Oral  Resp: 25 18 18 18   Height:  5\' 6"  (1.676 m)    Weight:  100.88 kg (222 lb 6.4 oz)  99.927 kg (220 lb 4.8 oz)  SpO2: 100% 100% 100% 100%   Weight change:   Intake/Output Summary (Last 24 hours) at 04/27/12 1101 Last data filed at 04/27/12 0850  Gross per 24 hour  Intake    300 ml  Output   1900 ml  Net  -1600 ml    Physical Exam: General: Awake, Oriented, No acute distress. HEENT: EOMI. Neck: Supple CV: S1 and S2 Lungs: Clear to ascultation bilaterally Abdomen: Soft, Nontender, Nondistended, +bowel sounds. Ext: Good pulses. 2+ LE edema, chronic venous stasis changes noted, multiple stasis ulcers noted bilaterally.  Lab Results: Basic Metabolic Panel:  Lab 04/27/12 5409 04/27/12 0520 04/26/12 2302 04/26/12 1751  NA 132* 140 -- 137  K 3.6 3.8 -- 4.0  CL 95* 100 -- 99  CO2 27 29 -- 29  GLUCOSE 250* 76 -- 94  BUN 33* 34* -- 37*  CREATININE 1.50* 1.49* 1.66* 1.79*  CALCIUM 9.0 9.3 -- 8.9  MG -- -- -- --  PHOS -- -- -- --   Liver Function Tests:  Lab 04/27/12 0845 04/26/12 1751  AST 15 16  ALT 14 15  ALKPHOS 71 70  BILITOT 0.2* 0.2*  PROT 6.9 6.9  ALBUMIN 3.3* 3.4*   No results found for this basename: LIPASE:5,AMYLASE:5 in the last 168 hours No results found for this basename: AMMONIA:5 in the last 168 hours CBC:  Lab 04/27/12 0845 04/27/12 0520 04/26/12 2302 04/26/12 1751  WBC 7.8 8.5 8.9 7.7  NEUTROABS 5.4 -- -- 5.6  HGB 10.9* 10.7* 10.4* 10.2*  HCT 34.1* 34.1* 33.4* 32.0*  MCV 85.5 85.0 85.0 85.1  PLT 171 179 185 169   Cardiac Enzymes:  Lab 04/27/12 0845 04/27/12 0520 04/26/12 2302 04/26/12 1752  CKTOTAL --  -- -- --  CKMB -- -- -- --  CKMBINDEX -- -- -- --  TROPONINI <0.30 <0.30 <0.30 <0.30   BNP (last 3 results)  Basename 04/26/12 1944 01/30/12 1619 01/16/12 1635  PROBNP 788.0* 207.0* 519.1*   CBG:  Lab 04/27/12 0606 04/27/12 0014  GLUCAP 89 224*   No results found for this basename: HGBA1C:5 in the last 72 hours Other Labs: No components found with this basename: POCBNP:3 No results found for this basename: DDIMER:2 in the last 168 hours No results found for this basename: CHOL:2,HDL:2,LDLCALC:2,TRIG:2,CHOLHDL:2,LDLDIRECT:2 in the last 168 hours No results found for this basename: TSH,T4TOTAL,FREET3,T3FREE,FREET4,THYROIDAB in the last 168 hours No results found for this basename: VITAMINB12:2,FOLATE:2,FERRITIN:2,TIBC:2,IRON:2,RETICCTPCT:2 in the last 168 hours  Micro Results: No results found for this or any previous visit (from the past 240 hour(s)).  Studies/Results: Dg Chest 2 View  04/26/2012  *RADIOLOGY REPORT*  Clinical Data: Shortness of breath, leg swelling  CHEST - 2 VIEW  Comparison: 04/04/2012  Findings: Cardiomegaly.  No frank interstitial edema.  Patchy bilateral lower lobe opacities, likely atelectasis. No pleural effusion or pneumothorax.  Postsurgical changes related to prior CABG.  Degenerative changes of the visualized thoracolumbar spine.  IMPRESSION: No  frank interstitial edema.  Patchy bilateral lower lobe opacities, likely atelectasis.  Cardiomegaly.   Original Report Authenticated By: Charline Bills, M.D.    Dg Tibia/fibula Left  04/26/2012  *RADIOLOGY REPORT*  Clinical Data: Left leg ulcer, rule out osteomyelitis  LEFT TIBIA AND FIBULA - 2 VIEW  Comparison: None.  Findings: Two views of the left tibia-fibula submitted.  No acute fracture or subluxation.  No periosteal reaction or bony erosion to suggest osteomyelitis.  Soft tissue irregularity is noted mid calf region peripheral.  IMPRESSION: No acute fracture or subluxation.  No periosteal reaction or bony  erosion to suggest osteomyelitis.   Original Report Authenticated By: Natasha Mead, M.D.    Dg Tibia/fibula Right  04/26/2012  *RADIOLOGY REPORT*  Clinical Data: Right leg ulcer, rule out osteomyelitis  RIGHT TIBIA AND FIBULA - 2 VIEW  Comparison: None.  Findings: Two views of the right tibia-fibula submitted.  No acute fracture or subluxation.  No periosteal reaction or bony erosion. Medial soft tissue surgical staples are noted.  IMPRESSION: No acute fracture or subluxation.  No periosteal reaction or bony erosion.   Original Report Authenticated By: Natasha Mead, M.D.     Medications: I have reviewed the patient's current medications. Scheduled Meds:   . aspirin EC  81 mg Oral Daily  . aspirin  325 mg Oral Once  . atorvastatin  40 mg Oral q1800  . carvedilol  6.25 mg Oral BID WC  . clopidogrel  75 mg Oral Q breakfast  . docusate sodium  100 mg Oral BID  . Fluticasone-Salmeterol  1 puff Inhalation BID  . furosemide  40 mg Intravenous Once  . furosemide  40 mg Intravenous Q12H  . gabapentin  600 mg Oral BID  . heparin  5,000 Units Subcutaneous Q8H  . HYDROcodone-acetaminophen  2 tablet Oral Once  . insulin aspart  0-15 Units Subcutaneous TID WC  . insulin aspart  0-5 Units Subcutaneous QHS  . insulin NPH  60 Units Subcutaneous QAC breakfast  . isosorbide mononitrate  30 mg Oral Daily  . pantoprazole  40 mg Oral Q1200  . potassium chloride SA  20 mEq Oral Daily  . sodium chloride  3 mL Intravenous Q12H  . spironolactone  12.5 mg Oral Daily  . Tamsulosin HCl  0.4 mg Oral Daily  . tiotropium  18 mcg Inhalation Daily  . DISCONTD: atorvastatin  40 mg Oral q1800  . DISCONTD: chlorhexidine  60 mL Topical Once  . DISCONTD: Docusate Sodium  100 mg Oral q12n4p  . DISCONTD: sodium chloride  3 mL Intravenous Q12H   Continuous Infusions:   . DISCONTD: sodium chloride 0.9 % 1,000 mL with potassium chloride 10 mEq infusion     PRN Meds:.sodium chloride, albuterol, HYDROcodone-acetaminophen,  HYDROmorphone (DILAUDID) injection, morphine injection, ondansetron (ZOFRAN) IV, ondansetron, sodium chloride  Assessment/Plan: Acute respiratory failure due to acute on chronic systolic heart failure Last echocardiogram on 01/12/2012 showed limited study, overall left ventricle ejection fraction appeared normal, inferior wall was hypokinetic.  Continue diuresis with furosemide 40 mg IV every 12 hours.  Losartan (ARB) held due to acute renal failure, will restart once renal function improves.  Continue spironolactone.  Acute renal failure on chronic kidney disease stage II Creatinine on admission 1.79, baseline creatinine 1.2 - 1.5.  Improved with diuresis, continue to monitor.  Left shoulder pain Suspect likely due to rotator cuff injury.  Discussed with Dr. Jillyn Hidden, orthopedics.  X-ray of the left shoulder ordered and pending.  Peripheral vascular disease with chronic  venous stasis/lower extremity edema with venous stasis ulcers Continue wound care.  Will request wound care consultation.  Continue pain management.  Diabetes type 2 uncontrolled with complications including retinopathy (legally blind) Sliding scale insulin.  Continue NPH.  Hypertension Stable.  Continue current antihypertensive medications.  Obesity Diet and exercise.    Coronary artery disease status post CABG Continue aspirin, Plavix, and carvedilol.  Hyperlipidemia Stable.  Generalized weakness Will request PT/OT evaluation.  Prophylaxis Subcutaneous heparin.  CODE STATUS Full code.   LOS: 1 day  Freeman Borba A, MD 04/27/2012, 11:01 AM

## 2012-04-27 NOTE — Consult Note (Signed)
Reason for Consult: Left shoulder pain Referring Physician: MD  Mark French is an 66 y.o. male.  HPI: Larey Seat 3 wks ago after knee surgery by [name]. Pain shoulder since.  Past Medical History  Diagnosis Date  . CORONARY ARTERY DISEASE 03/11/2007    s/p CABG 1998;  admitted 08/2011 with an inferior STEMI.  LHC 09/07/11: Severe  Three-vessel CAD, LIMA-LAD patent, SVG-RCA chronically occluded, SVG-circumflex chronically occluded, SVG-diagonal 95%.  PCI: Promus DES to the SVG-diagonal.  Procedure c/b transient CHB req CPR and TTVP;  myoview 4/13:large severe inferolateral infarct, no ischemia, EF 25%    . DIABETES MELLITUS, TYPE I 03/11/2007  . HYPERTENSION 03/11/2007  . Proliferative diabetic retinopathy 03/09/2009  . HYPERCHOLESTEROLEMIA 09/28/2008  . CERVICAL RADICULOPATHY, LEFT 09/23/2008  . PERIPHERAL NEUROPATHY 09/23/2008  . Chronic systolic heart failure 09/23/2008    Echocardiogram 09/07/11: Difficult study, inferior and posterior lateral wall hypokinesis, mild LVH, EF 45%, mild LAE.   [name] RENAL INSUFFICIENCY 03/15/2010  . ED (erectile dysfunction)   . Hypogonadism male   . GI bleed 2012    Multiple Duodenal ulcer   . Obesity   . Legally blind     bilaterally  . Stroke 46    age 68 months; residual right sided weakness  . History of blood transfusion 2012  . Charcot foot due to diabetes mellitus     chronic pain  . Osteoarthritis     right hip and shoulder  . COPD (chronic obstructive pulmonary disease)   . Myocardial infarction 2013b Jan  . PERIPHERAL VASCULAR INSUFFICIENCY,  LEGS, BILATERAL 08/17/2010    ABI's performed 04/19/11 R 0.72 L 0.71  . ANEMIA-IRON DEFICIENCY 09/23/2008    ulcer related last year    Past Surgical History  Procedure Date  . Carpal tunnel release     left  . Sigmoidoscopy 03/11/2001  . Venous doppler 01/30/2004  . Tonsillectomy and adenoidectomy     "as a kid"  . Cataract extraction w/ intraocular lens  implant, bilateral   . Coronary angioplasty with  stent placement 08/2011    "1"  . Retinopathy surgery 2000's    "laser; both eyes"  . Wrist fusion 1976    right  . Anterior fusion cervical spine 2010    "C spine; Dr. Yetta Barre"  . Ankle fracture surgery 1976; ?date    LEFT:  fused; removed bulk of hardware  . Heel spur surgery ~ 2007    ? left  . Irrigation and debridement knee 02/27/2012    Procedure: IRRIGATION AND DEBRIDEMENT KNEE;  Surgeon: Verlee Rossetti, MD;  Location: [facility] OR;  Service: Orthopedics;  Laterality: Right;  irrigation and drainage right knee septic bursitis  . Eye surgery 2008    cataract removal, cautery  . Coronary artery bypass graft 1998    CABG X5  . Fracture surgery     foot broken 1976    Family History  Problem Relation Age of Onset  . Heart disease Mother     Coronary Artery Disease  . Heart disease Father     Coronary Artery Disease  . Heart disease Paternal Grandmother     Coronary Artery Disease  . Colon cancer Neg Hx   . Diabetes Other     Grandmother    Social History:  reports that he has never smoked. He has never used smokeless tobacco. He reports that he does not drink alcohol or use illicit drugs.  Allergies:  Allergies  Allergen Reactions  . Sildenafil Nausea  Only and Other (See Comments)    "took it once; heart went to pieces; never took it again"  . Contrast Media (Iodinated Diagnostic Agents) Nausea Only    Medications: I have reviewed the patient's current medications.  Results for orders placed during the hospital encounter of 04/26/12 (from the past 48 hour(s))  CBC WITH DIFFERENTIAL     Status: Abnormal   Collection Time   04/26/12  5:51 PM      Component Value Range Comment   WBC 7.7  4.0 - 10.5 K/uL    RBC 3.76 (*) 4.22 - 5.81 MIL/uL    Hemoglobin 10.2 (*) 13.0 - 17.0 g/dL    HCT 16.1 (*) 09.6 - 52.0 %    MCV 85.1  78.0 - 100.0 fL    MCH 27.1  26.0 - 34.0 pg    MCHC 31.9  30.0 - 36.0 g/dL    RDW 04.5  40.9 - 81.1 %    Platelets 169  150 - 400 K/uL    Neutrophils  Relative 72  43 - 77 %    Neutro Abs 5.6  1.7 - 7.7 K/uL    Lymphocytes Relative 14  12 - 46 %    Lymphs Abs 1.1  0.7 - 4.0 K/uL    Monocytes Relative 10  3 - 12 %    Monocytes Absolute 0.8  0.1 - 1.0 K/uL    Eosinophils Relative 4  0 - 5 %    Eosinophils Absolute 0.3  0.0 - 0.7 K/uL    Basophils Relative 0  0 - 1 %    Basophils Absolute 0.0  0.0 - 0.1 K/uL   COMPREHENSIVE METABOLIC PANEL     Status: Abnormal   Collection Time   04/26/12  5:51 PM      Component Value Range Comment   Sodium 137  135 - 145 mEq/L    Potassium 4.0  3.5 - 5.1 mEq/L    Chloride 99  96 - 112 mEq/L    CO2 29  19 - 32 mEq/L    Glucose, Bld 94  70 - 99 mg/dL    BUN 37 (*) 6 - 23 mg/dL    Creatinine, Ser 9.14 (*) 0.50 - 1.35 mg/dL    Calcium 8.9  8.4 - 78.2 mg/dL    Total Protein 6.9  6.0 - 8.3 g/dL    Albumin 3.4 (*) 3.5 - 5.2 g/dL    AST 16  0 - 37 U/L    ALT 15  0 - 53 U/L    Alkaline Phosphatase 70  39 - 117 U/L    Total Bilirubin 0.2 (*) 0.3 - 1.2 mg/dL    GFR calc non Af Amer 37 (*) >90 mL/min    GFR calc Af Amer 43 (*) >90 mL/min   TROPONIN I     Status: Normal   Collection Time   04/26/12  5:52 PM      Component Value Range Comment   Troponin I <0.30  <0.30 ng/mL   PRO B NATRIURETIC PEPTIDE     Status: Abnormal   Collection Time   04/26/12  7:44 PM      Component Value Range Comment   Pro B Natriuretic peptide (BNP) 788.0 (*) 0 - 125 pg/mL   CBC     Status: Abnormal   Collection Time   04/26/12 11:02 PM      Component Value Range Comment   WBC 8.9  4.0 - 10.5 K/uL  RBC 3.93 (*) 4.22 - 5.81 MIL/uL    Hemoglobin 10.4 (*) 13.0 - 17.0 g/dL    HCT 16.1 (*) 09.6 - 52.0 %    MCV 85.0  78.0 - 100.0 fL    MCH 26.5  26.0 - 34.0 pg    MCHC 31.1  30.0 - 36.0 g/dL    RDW 04.5  40.9 - 81.1 %    Platelets 185  150 - 400 K/uL   CREATININE, SERUM     Status: Abnormal   Collection Time   04/26/12 11:02 PM      Component Value Range Comment   Creatinine, Ser 1.66 (*) 0.50 - 1.35 mg/dL    GFR calc non  Af Amer 41 (*) >90 mL/min    GFR calc Af Amer 47 (*) >90 mL/min   TROPONIN I     Status: Normal   Collection Time   04/26/12 11:02 PM      Component Value Range Comment   Troponin I <0.30  <0.30 ng/mL   GLUCOSE, CAPILLARY     Status: Abnormal   Collection Time   04/27/12 12:14 AM      Component Value Range Comment   Glucose-Capillary 224 (*) 70 - 99 mg/dL   BASIC METABOLIC PANEL     Status: Abnormal   Collection Time   04/27/12  5:20 AM      Component Value Range Comment   Sodium 140  135 - 145 mEq/L    Potassium 3.8  3.5 - 5.1 mEq/L    Chloride 100  96 - 112 mEq/L    CO2 29  19 - 32 mEq/L    Glucose, Bld 76  70 - 99 mg/dL    BUN 34 (*) 6 - 23 mg/dL    Creatinine, Ser 9.14 (*) 0.50 - 1.35 mg/dL    Calcium 9.3  8.4 - 78.2 mg/dL    GFR calc non Af Amer 46 (*) >90 mL/min    GFR calc Af Amer 54 (*) >90 mL/min   CBC     Status: Abnormal   Collection Time   04/27/12  5:20 AM      Component Value Range Comment   WBC 8.5  4.0 - 10.5 K/uL    RBC 4.01 (*) 4.22 - 5.81 MIL/uL    Hemoglobin 10.7 (*) 13.0 - 17.0 g/dL    HCT 95.6 (*) 21.3 - 52.0 %    MCV 85.0  78.0 - 100.0 fL    MCH 26.7  26.0 - 34.0 pg    MCHC 31.4  30.0 - 36.0 g/dL    RDW 08.6  57.8 - 46.9 %    Platelets 179  150 - 400 K/uL   TROPONIN I     Status: Normal   Collection Time   04/27/12  5:20 AM      Component Value Range Comment   Troponin I <0.30  <0.30 ng/mL   GLUCOSE, CAPILLARY     Status: Normal   Collection Time   04/27/12  6:06 AM      Component Value Range Comment   Glucose-Capillary 89  70 - 99 mg/dL   APTT     Status: Abnormal   Collection Time   04/27/12  8:45 AM      Component Value Range Comment   aPTT 40 (*) 24 - 37 seconds   CBC WITH DIFFERENTIAL     Status: Abnormal   Collection Time   04/27/12  8:45 AM  Component Value Range Comment   WBC 7.8  4.0 - 10.5 K/uL    RBC 3.99 (*) 4.22 - 5.81 MIL/uL    Hemoglobin 10.9 (*) 13.0 - 17.0 g/dL    HCT 16.1 (*) 09.6 - 52.0 %    MCV 85.5  78.0 - 100.0 fL     MCH 27.3  26.0 - 34.0 pg    MCHC 32.0  30.0 - 36.0 g/dL    RDW 04.5  40.9 - 81.1 %    Platelets 171  150 - 400 K/uL    Neutrophils Relative 70  43 - 77 %    Neutro Abs 5.4  1.7 - 7.7 K/uL    Lymphocytes Relative 14  12 - 46 %    Lymphs Abs 1.1  0.7 - 4.0 K/uL    Monocytes Relative 11  3 - 12 %    Monocytes Absolute 0.9  0.1 - 1.0 K/uL    Eosinophils Relative 5  0 - 5 %    Eosinophils Absolute 0.4  0.0 - 0.7 K/uL    Basophils Relative 0  0 - 1 %    Basophils Absolute 0.0  0.0 - 0.1 K/uL   COMPREHENSIVE METABOLIC PANEL     Status: Abnormal   Collection Time   04/27/12  8:45 AM      Component Value Range Comment   Sodium 132 (*) 135 - 145 mEq/L    Potassium 3.6  3.5 - 5.1 mEq/L    Chloride 95 (*) 96 - 112 mEq/L    CO2 27  19 - 32 mEq/L    Glucose, Bld 250 (*) 70 - 99 mg/dL    BUN 33 (*) 6 - 23 mg/dL    Creatinine, Ser 9.14 (*) 0.50 - 1.35 mg/dL    Calcium 9.0  8.4 - 78.2 mg/dL    Total Protein 6.9  6.0 - 8.3 g/dL    Albumin 3.3 (*) 3.5 - 5.2 g/dL    AST 15  0 - 37 U/L    ALT 14  0 - 53 U/L    Alkaline Phosphatase 71  39 - 117 U/L    Total Bilirubin 0.2 (*) 0.3 - 1.2 mg/dL    GFR calc non Af Amer 46 (*) >90 mL/min    GFR calc Af Amer 53 (*) >90 mL/min   PROTIME-INR     Status: Normal   Collection Time   04/27/12  8:45 AM      Component Value Range Comment   Prothrombin Time 14.4  11.6 - 15.2 seconds    INR 1.14  0.00 - 1.49   TROPONIN I     Status: Normal   Collection Time   04/27/12  8:45 AM      Component Value Range Comment   Troponin I <0.30  <0.30 ng/mL   GLUCOSE, CAPILLARY     Status: Abnormal   Collection Time   04/27/12 11:46 AM      Component Value Range Comment   Glucose-Capillary 157 (*) 70 - 99 mg/dL    Comment 1 Notify RN     GLUCOSE, CAPILLARY     Status: Abnormal   Collection Time   04/27/12  4:03 PM      Component Value Range Comment   Glucose-Capillary 134 (*) 70 - 99 mg/dL    Comment 1 Notify RN       Dg Chest 2 View  04/26/2012  *RADIOLOGY  REPORT*  Clinical Data: Shortness of breath, leg swelling  CHEST - 2 VIEW  Comparison: 04/04/2012  Findings: Cardiomegaly.  No frank interstitial edema.  Patchy bilateral lower lobe opacities, likely atelectasis. No pleural effusion or pneumothorax.  Postsurgical changes related to prior CABG.  Degenerative changes of the visualized thoracolumbar spine.  IMPRESSION: No frank interstitial edema.  Patchy bilateral lower lobe opacities, likely atelectasis.  Cardiomegaly.   Original Report Authenticated By: Charline Bills, M.D.    Dg Tibia/fibula Left  04/26/2012  *RADIOLOGY REPORT*  Clinical Data: Left leg ulcer, rule out osteomyelitis  LEFT TIBIA AND FIBULA - 2 VIEW  Comparison: None.  Findings: Two views of the left tibia-fibula submitted.  No acute fracture or subluxation.  No periosteal reaction or bony erosion to suggest osteomyelitis.  Soft tissue irregularity is noted mid calf region peripheral.  IMPRESSION: No acute fracture or subluxation.  No periosteal reaction or bony erosion to suggest osteomyelitis.   Original Report Authenticated By: Natasha Mead, M.D.    Dg Tibia/fibula Right  04/26/2012  *RADIOLOGY REPORT*  Clinical Data: Right leg ulcer, rule out osteomyelitis  RIGHT TIBIA AND FIBULA - 2 VIEW  Comparison: None.  Findings: Two views of the right tibia-fibula submitted.  No acute fracture or subluxation.  No periosteal reaction or bony erosion. Medial soft tissue surgical staples are noted.  IMPRESSION: No acute fracture or subluxation.  No periosteal reaction or bony erosion.   Original Report Authenticated By: Natasha Mead, M.D.     Review of Systems  Constitutional: Negative.   HENT: Negative.   Eyes: Negative.   Respiratory: Negative.   Cardiovascular: Negative.   Gastrointestinal: Negative.   Genitourinary: Negative.   Musculoskeletal: Positive for joint pain.  Skin: Negative.   Neurological: Negative.   Endo/Heme/Allergies: Negative.   Psychiatric/Behavioral: Negative.    Blood  pressure 111/61, pulse 69, temperature 97.8 F (36.6 C), temperature source Oral, resp. rate 18, height 5\' 6"  (1.676 m), weight 99.927 kg (220 lb 4.8 oz), SpO2 98.00%. Physical Exam  Constitutional: He is oriented to person, place, and time. He appears well-developed.  HENT:  Head: Normocephalic.  Neck: Normal range of motion.  Cardiovascular: Normal rate.   Respiratory: Effort normal.  GI: Soft.  Musculoskeletal:       Positive impingement left shoulder. No swelling. Good ROM. NVI. NT [facility]  Neurological: He is alert and oriented to person, place, and time.  Skin: Skin is warm and dry.  Psychiatric: He has a normal mood and affect.    Assessment/Plan: Left shoulder pain after a fall. Impingement syndrome left shoulder. Possible rotator cuff arthropathy though good ROM with minimal pain. Recommend F/U with [name] when patient sees him for his postop visit for his knee. Instructed patient to call Monday to confirm that appt.  Discussed possible cortisone injection. Xrays of shoulder then. Avoid overhead use of arm.   Mark French C 04/27/2012, 4:15 PM

## 2012-04-27 NOTE — Progress Notes (Signed)
  Echocardiogram 2D Echocardiogram has been performed.  Hannelore Bova FRANCES 04/27/2012, 12:10 PM

## 2012-04-27 NOTE — Progress Notes (Signed)
INITIAL ADULT NUTRITION ASSESSMENT Date: 04/27/2012   Time: 10:32 AM  INTERVENTION:  Snacks 3 times daily (10 am, 2 pm, 8 pm)  RD to follow for nutrition care plan  Reason for Assessment: Malnutrition Screening Tool Report  ASSESSMENT: [name]  Dx: shortness of breath and pedal edema  Hx:  Past Medical History  Diagnosis Date  . CORONARY ARTERY DISEASE 03/11/2007    s/p CABG 1998;  admitted 08/2011 with an inferior STEMI.  LHC 09/07/11: Severe  Three-vessel CAD, LIMA-LAD patent, SVG-RCA chronically occluded, SVG-circumflex chronically occluded, SVG-diagonal 95%.  PCI: Promus DES to the SVG-diagonal.  Procedure c/b transient CHB req CPR and TTVP;  myoview 4/13:large severe inferolateral infarct, no ischemia, EF 25%    . DIABETES MELLITUS, TYPE I 03/11/2007  . HYPERTENSION 03/11/2007  . Proliferative diabetic retinopathy 03/09/2009  . HYPERCHOLESTEROLEMIA 09/28/2008  . CERVICAL RADICULOPATHY, LEFT 09/23/2008  . PERIPHERAL NEUROPATHY 09/23/2008  . Chronic systolic heart failure 09/23/2008    Echocardiogram 09/07/11: Difficult study, inferior and posterior lateral wall hypokinesis, mild LVH, EF 45%, mild LAE.   [name] RENAL INSUFFICIENCY 03/15/2010  . ED (erectile dysfunction)   . Hypogonadism male   . GI bleed 2012    Multiple Duodenal ulcer   . Obesity   . Legally blind     bilaterally  . Stroke 41    age 55 months; residual right sided weakness  . History of blood transfusion 2012  . Charcot foot due to diabetes mellitus     chronic pain  . Osteoarthritis     right hip and shoulder  . COPD (chronic obstructive pulmonary disease)   . Myocardial infarction 2013b Jan  . PERIPHERAL VASCULAR INSUFFICIENCY,  LEGS, BILATERAL 08/17/2010    ABI's performed 04/19/11 R 0.72 L 0.71  . ANEMIA-IRON DEFICIENCY 09/23/2008    ulcer related last year    Related Meds:     . aspirin EC  81 mg Oral Daily  . aspirin  325 mg Oral Once  . atorvastatin  40 mg Oral q1800  . carvedilol  6.25 mg Oral BID  WC  . clopidogrel  75 mg Oral Q breakfast  . docusate sodium  100 mg Oral BID  . Fluticasone-Salmeterol  1 puff Inhalation BID  . furosemide  40 mg Intravenous Once  . furosemide  40 mg Intravenous Q12H  . gabapentin  600 mg Oral BID  . heparin  5,000 Units Subcutaneous Q8H  . HYDROcodone-acetaminophen  2 tablet Oral Once  . insulin aspart  0-15 Units Subcutaneous TID WC  . insulin aspart  0-5 Units Subcutaneous QHS  . insulin NPH  60 Units Subcutaneous QAC breakfast  . isosorbide mononitrate  30 mg Oral Daily  . pantoprazole  40 mg Oral Q1200  . potassium chloride SA  20 mEq Oral Daily  . sodium chloride  3 mL Intravenous Q12H  . spironolactone  12.5 mg Oral Daily  . Tamsulosin HCl  0.4 mg Oral Daily  . tiotropium  18 mcg Inhalation Daily  . DISCONTD: atorvastatin  40 mg Oral q1800  . DISCONTD: chlorhexidine  60 mL Topical Once  . DISCONTD: Docusate Sodium  100 mg Oral q12n4p  . DISCONTD: sodium chloride  3 mL Intravenous Q12H    Ht: 5\' 6"  (167.6 cm)  Wt: 220 lb 4.8 oz (99.927 kg) (scale a)  Ideal Wt: 64.5 kg % Ideal Wt: 153%  Usual Wt: 238 lb -- per office visit record November 2012 % Usual Wt: 92%  Body mass  index is 35.56 kg/(m^2).  Food/Nutrition Related Hx: recent weight lost without trying and decreased appetite per admission nutrition screen  Labs:  CMP     Component Value Date/Time   NA 132* 04/27/2012 0845   K 3.6 04/27/2012 0845   CL 95* 04/27/2012 0845   CO2 27 04/27/2012 0845   GLUCOSE 250* 04/27/2012 0845   BUN 33* 04/27/2012 0845   CREATININE 1.50* 04/27/2012 0845   CALCIUM 9.0 04/27/2012 0845   CALCIUM 8.7 12/15/2010 1645   PROT 6.9 04/27/2012 0845   ALBUMIN 3.3* 04/27/2012 0845   AST 15 04/27/2012 0845   ALT 14 04/27/2012 0845   ALKPHOS 71 04/27/2012 0845   BILITOT 0.2* 04/27/2012 0845   GFRNONAA 46* 04/27/2012 0845   GFRAA 53* 04/27/2012 0845     Intake/Output Summary (Last 24 hours) at 04/27/12 1033 Last data filed at 04/27/12 0850  Gross per 24 hour   Intake    300 ml  Output   1900 ml  Net  -1600 ml    CBG (last 3)   Basename 04/27/12 0606 04/27/12 0014  GLUCAP 89 224*    Diet Order: Carb Control  Supplements/Tube Feeding: N/A  IVF:    DISCONTD: sodium chloride 0.9 % 1,000 mL with potassium chloride 10 mEq infusion    Estimated Nutritional Needs:   Kcal: 2100-2300 Protein: 105-115 gm Fluid: 2.1-2.3 L  Patient presented to ER with increase DOE, SOB, and increase bilateral pedal edema; admitted for acute on chronic CHF; noted patient with leg stasis ulcers (followed by outpatient wound care center); states his appetite is OK; per records, patient has had an approximate 18 lb weight loss (7.5%) in 10 months -- not significant for time frame; no % PO intake available at this time; requesting snacks between meals -- RD to order.  NUTRITION DIAGNOSIS: -Increased nutrient needs (NI-5.1).  Status: Ongoing  RELATED TO: wound healing  AS EVIDENCE BY: estimated nutrition needs  MONITORING/EVALUATION(Goals): Goal: Oral intake with meals & snacks to meet >/= 90% of estimated nutrition needs Monitor: PO intake, weight, labs, I/O's  EDUCATION NEEDS: -No education needs identified at this time  Dietitian #: [phone]  DOCUMENTATION CODES Per approved criteria  -Obesity Unspecified    Alger Memos 04/27/2012, 10:32 AM

## 2012-04-28 DIAGNOSIS — J449 Chronic obstructive pulmonary disease, unspecified: Secondary | ICD-10-CM

## 2012-04-28 DIAGNOSIS — M79609 Pain in unspecified limb: Secondary | ICD-10-CM

## 2012-04-28 LAB — BASIC METABOLIC PANEL
BUN: 33 mg/dL — ABNORMAL HIGH (ref 6–23)
Calcium: 9.2 mg/dL (ref 8.4–10.5)
GFR calc Af Amer: 56 mL/min — ABNORMAL LOW (ref >90)
GFR calc non Af Amer: 48 mL/min — ABNORMAL LOW (ref >90)
Potassium: 4 mEq/L (ref 3.5–5.1)
Sodium: 135 mEq/L (ref 135–145)

## 2012-04-28 LAB — GLUCOSE, CAPILLARY
Glucose-Capillary: 53 mg/dL — ABNORMAL LOW (ref 70–99)
Glucose-Capillary: 58 mg/dL — ABNORMAL LOW (ref 70–99)

## 2012-04-28 LAB — CBC
MCH: 27.1 pg (ref 26.0–34.0)
MCHC: 31.8 g/dL (ref 30.0–36.0)
RDW: 14.7 % (ref 11.5–15.5)

## 2012-04-28 MED ORDER — FUROSEMIDE 80 MG PO TABS: ORAL_TABLET | ORAL | Status: DC

## 2012-04-28 NOTE — Discharge Summary (Signed)
Physician Discharge Summary  Mark French:[id number] DOB: [birth year] DOA: 04/26/2012  PCP: [name], MD  Admit date: 04/26/2012 Discharge date: 04/28/2012  Recommendations for Outpatient Follow-up:  Follow up with Dr. Excell Seltzer in 1-2 weeks Follow up with primary doctor as scheduled Patient will be set up with home health services to help manage his CHF  Discharge Diagnoses:  Principal Problem:  *Acute on chronic systolic heart failure Active Problems:  HYPERCHOLESTEROLEMIA  BLINDNESS  RENAL INSUFFICIENCY  Edema  CEREBROVASCULAR ACCIDENT, HX OF  DM2 (diabetes mellitus, type 2)  Constipation due to pain medication   Discharge Condition: improved  Diet recommendation: low salt, low carb  Filed Weights   04/26/12 2245 04/27/12 0515 04/28/12 0428  Weight: 100.88 kg (222 lb 6.4 oz) 99.927 kg (220 lb 4.8 oz) 99.428 kg (219 lb 3.2 oz)    History of present illness:  Mark French is an 66 y.o. male with known CAD s/p prior CABG, with last EF 25% via myoview 4/13, recent MI about 7 months ago ([place]), Hx of HTN, DM, GI bleed, DM retinopathy, CKD with Cr 1.2-1.4, anemia, chronic pedal edema, COPD ([place]), presents to ER with increase DOE, SOB, and increase bilateral pedal edema. His PMD had increase PO lasix but it has not been improving. Evaluation in the ER with EKG showed LAFB, RBBB, with V1 adn V2 showing ST segment depression as comparing to the old EKG. His troponin was negative. His ProBNP was 700 (prior 200), his Cr 1.7, and CXR showed no frank edema, and no definite infiltrates, but cardiomegaly. He had foot ulcer and X-rays were done which did not show any evidence of osteomyelitis. He also complaint of left shoulder pain, with no trauma. Hospitalist was asked to admit him for acute on chronic CHF.   Hospital Course:  This gentleman was admitted to the hospital with progressive shortness of breath.  He was found to have acute on chronic heart failure.  He was  started on IV lasix and has had good clinical response.  His respiratory status has improved, and he is breathing comfortably on room air with oxygen saturations in the high 90s to 100.  He does have some pedal edema, but review of records indicate that this is a chronic problem.  He is followed by [facility].  He feels that his breathing is currently back to baseline. He does have poor functional capacity at baseline.  He feels that he is ready to go home today.  He will be discharged with planned follow up with cardiology.  He will also be set up with home health services  His diabetes is managed by [name] and he will follow up for further management  He does have lower extremity wounds that are chronic and managed at the wound center.  xrays did not reveal any osteomyelitis  He does have left arm pain without any recent trauma.  He was seen by orthopedics.  Xrays indicate a remote humerus fracture.  He has close to full range of motion of his arm.  Recommendations were to follow up with [name] for further management and to avoid over head movements.  Procedures:  none  Consultations:  Orthopedics, Dr. Shelle Iron  Discharge Exam: Filed Vitals:   04/27/12 1338 04/27/12 2035 04/28/12 0428 04/28/12 0955  BP: 111/61 106/58 117/66 110/70  Pulse: 69 68 64 66  Temp: 97.8 F (36.6 C) 98.7 F (37.1 C) 97.3 F (36.3 C) 97.6 F (36.4 C)  TempSrc: Oral Oral  Oral Oral  Resp:  18 18 18   Height:      Weight:   99.428 kg (219 lb 3.2 oz)   SpO2: 98% 97% 98% 100%    General: NAD Cardiovascular: s1, s2, rrr, 1+ edema b/l Respiratory: cta b  Discharge Instructions      Discharge Orders    Future Appointments: Provider: Department: Dept Phone: Center:   07/12/2012 11:45 AM Tonny Bollman, MD [facility] [phone] [facility]   08/13/2012 1:00 PM [name], MD [facility] [phone] [facility]     Future Orders Please Complete By Expires   Diet - low sodium heart  healthy      Home Health      Questions: Responses:   To provide the following care/treatments RN   Face-to-face encounter      Comments:   I [name] certify that this patient is under my care and that I, or a nurse practitioner or physician's assistant working with me, had a face-to-face encounter that meets the physician face-to-face encounter requirements with this patient on 04/28/2012.   Questions: Responses:   The encounter with the patient was in whole, or in part, for the following medical condition, which is the primary reason for home health care CHF exacerbation   I certify that, based on my findings, the following services are medically necessary home health services Nursing   My clinical findings support the need for the above services Acute exacerbation of CHF   Further, I certify that my clinical findings support that this patient is homebound due to: Ambulates short distances less than 300 feet   To provide the following care/treatments RN   Increase activity slowly      (HEART FAILURE PATIENTS) Call MD:  Anytime you have any of the following symptoms: 1) 3 pound weight gain in 24 hours or 5 pounds in 1 week 2) shortness of breath, with or without a dry hacking cough 3) swelling in the hands, feet or stomach 4) if you have to sleep on extra pillows at night in order to breathe.          Medication List     As of 04/28/2012  1:21 PM    TAKE these medications         albuterol 108 (90 BASE) MCG/ACT inhaler   Commonly known as: PROVENTIL HFA;VENTOLIN HFA   Inhale 2 puffs into the lungs every 6 (six) hours as needed. For shortness of breath      aspirin EC 325 MG tablet   Take 81 mg by mouth daily.      atorvastatin 40 MG tablet   Commonly known as: LIPITOR   Take 1 tablet (40 mg total) by mouth daily.      carvedilol 6.25 MG tablet   Commonly known as: COREG   Take 6.25 mg by mouth 2 (two) times daily with a meal.      clopidogrel 75 MG tablet   Commonly  known as: PLAVIX   Take 75 mg by mouth daily with breakfast.      Fluticasone-Salmeterol 100-50 MCG/DOSE Aepb   Commonly known as: ADVAIR   Inhale 1 puff into the lungs 2 (two) times daily.      furosemide 80 MG tablet   Commonly known as: LASIX   Take 80mg  po TID for 3 days, then return to 80mg  po BID      gabapentin 600 MG tablet   Commonly known as: NEURONTIN   Take 1 tablet (600 mg total)  by mouth 2 (two) times daily.      HYDROcodone-acetaminophen 10-325 MG per tablet   Commonly known as: NORCO   Take 1 tablet by mouth every 4 (four) hours as needed. For pain      insulin NPH 100 UNIT/ML injection   Commonly known as: HUMULIN N,NOVOLIN N   Inject 60 Units into the skin every morning.      isosorbide mononitrate 30 MG 24 hr tablet   Commonly known as: IMDUR   Take 30 mg by mouth daily.      losartan 50 MG tablet   Commonly known as: COZAAR   Take 50 mg by mouth daily.      pantoprazole 40 MG tablet   Commonly known as: PROTONIX   Take 40 mg by mouth daily.      potassium chloride SA 20 MEQ tablet   Commonly known as: K-DUR,KLOR-CON   Take 20 mEq by mouth daily.      spironolactone 25 MG tablet   Commonly known as: ALDACTONE   Take 12.5 mg by mouth daily.      STOOL SOFTENER 100 MG capsule   Generic drug: Docusate Sodium   Take 100 mg by mouth daily as needed. constipation      Tamsulosin HCl 0.4 MG Caps   Commonly known as: FLOMAX   Take 0.4 mg by mouth daily.      tiotropium 18 MCG inhalation capsule   Commonly known as: SPIRIVA   Place 1 capsule (18 mcg total) into inhaler and inhale daily.         Follow-up Information    Follow up with Tonny Bollman, MD. (follow up in 1-2 weeks)    Contact information:   [address] [place] [address] [phone]       Follow up with [name], MD. (as scheduled)    Contact information:   [address] [facility] 4th [address] [place] [address] [phone]           The  results of significant diagnostics from this hospitalization (including imaging, microbiology, ancillary and laboratory) are listed below for reference.    Significant Diagnostic Studies: Dg Chest 2 View  04/26/2012  *RADIOLOGY REPORT*  Clinical Data: Shortness of breath, leg swelling  CHEST - 2 VIEW  Comparison: 04/04/2012  Findings: Cardiomegaly.  No frank interstitial edema.  Patchy bilateral lower lobe opacities, likely atelectasis. No pleural effusion or pneumothorax.  Postsurgical changes related to prior CABG.  Degenerative changes of the visualized thoracolumbar spine.  IMPRESSION: No frank interstitial edema.  Patchy bilateral lower lobe opacities, likely atelectasis.  Cardiomegaly.   Original Report Authenticated By: Charline Bills, M.D.    Dg Chest 2 View  04/04/2012  *RADIOLOGY REPORT*  Clinical Data: Shortness of breath, prior coronary bypass, diabetes, hypertension  CHEST - 2 VIEW  Comparison: 01/16/2012  Findings: Coronary bypass changes noted.  Stable cardiomegaly with vascular prominence.  No focal pneumonia, collapse, consolidation, edema, effusion or pneumothorax.  Lower cervical fusion hardware partially imaged.  IMPRESSION: Stable postoperative appearance.  No superimposed acute process   Original Report Authenticated By: Judie Petit. Ruel Favors, M.D.    Dg Tibia/fibula Left  04/26/2012  *RADIOLOGY REPORT*  Clinical Data: Left leg ulcer, rule out osteomyelitis  LEFT TIBIA AND FIBULA - 2 VIEW  Comparison: None.  Findings: Two views of the left tibia-fibula submitted.  No acute fracture or subluxation.  No periosteal reaction or bony erosion to suggest osteomyelitis.  Soft tissue irregularity is  noted mid calf region peripheral.  IMPRESSION: No acute fracture or subluxation.  No periosteal reaction or bony erosion to suggest osteomyelitis.   Original Report Authenticated By: Natasha Mead, M.D.    Dg Tibia/fibula Right  04/26/2012  *RADIOLOGY REPORT*  Clinical Data: Right leg ulcer, rule out  osteomyelitis  RIGHT TIBIA AND FIBULA - 2 VIEW  Comparison: None.  Findings: Two views of the right tibia-fibula submitted.  No acute fracture or subluxation.  No periosteal reaction or bony erosion. Medial soft tissue surgical staples are noted.  IMPRESSION: No acute fracture or subluxation.  No periosteal reaction or bony erosion.   Original Report Authenticated By: Natasha Mead, M.D.    Dg Shoulder Left  04/27/2012  *RADIOLOGY REPORT*  Clinical Data: Left-sided shoulder pain.  LEFT SHOULDER - 2+ VIEW  Comparison: 11/18/2005.  Findings: There is an irregularity of the superolateral aspect of the left humeral head with sclerotic margins, that is favored to represent an old Hill-Sachs type fracture.  However, the shoulder is clearly located on today's examination.  Orthopedic fixation hardware in the lower cervical spine is incompletely visualized, however, the lower right fixation screw does not appear to be properly seated (this is similar to numerous prior examinations).  IMPRESSION: 1.  Probable old Hill-Sachs type fracture in the left proximal humerus (this is age indeterminate, but favored to be remote).   Original Report Authenticated By: Florencia Reasons, M.D.     Microbiology: No results found for this or any previous visit (from the past 240 hour(s)).   Labs: Basic Metabolic Panel:  Lab 04/28/12 5956 04/27/12 0845 04/27/12 0520 04/26/12 2302 04/26/12 1751  NA 135 132* 140 -- 137  K 4.0 3.6 3.8 -- 4.0  CL 96 95* 100 -- 99  CO2 27 27 29  -- 29  GLUCOSE 157* 250* 76 -- 94  BUN 33* 33* 34* -- 37*  CREATININE 1.44* 1.50* 1.49* 1.66* 1.79*  CALCIUM 9.2 9.0 9.3 -- 8.9  MG -- -- -- -- --  PHOS -- -- -- -- --   Liver Function Tests:  Lab 04/27/12 0845 04/26/12 1751  AST 15 16  ALT 14 15  ALKPHOS 71 70  BILITOT 0.2* 0.2*  PROT 6.9 6.9  ALBUMIN 3.3* 3.4*   No results found for this basename: LIPASE:5,AMYLASE:5 in the last 168 hours No results found for this basename: AMMONIA:5 in the  last 168 hours CBC:  Lab 04/28/12 0410 04/27/12 0845 04/27/12 0520 04/26/12 2302 04/26/12 1751  WBC 8.3 7.8 8.5 8.9 7.7  NEUTROABS -- 5.4 -- -- 5.6  HGB 10.3* 10.9* 10.7* 10.4* 10.2*  HCT 32.4* 34.1* 34.1* 33.4* 32.0*  MCV 85.3 85.5 85.0 85.0 85.1  PLT 188 171 179 185 169   Cardiac Enzymes:  Lab 04/27/12 0845 04/27/12 0520 04/26/12 2302 04/26/12 1752  CKTOTAL -- -- -- --  CKMB -- -- -- --  CKMBINDEX -- -- -- --  TROPONINI <0.30 <0.30 <0.30 <0.30   BNP: BNP (last 3 results)  Basename 04/26/12 1944 01/30/12 1619 01/16/12 1635  PROBNP 788.0* 207.0* 519.1*   CBG:  Lab 04/28/12 1208 04/28/12 1140 04/28/12 1113 04/28/12 0626 04/27/12 2049  GLUCAP 73 58* 53* 144* 186*    Time coordinating discharge: greater than 30 minutes  Signed:  Curry Dulski  [facility] 04/28/2012, 1:21 PM

## 2012-04-28 NOTE — Progress Notes (Signed)
Patient and wife verbalizes understanding of all discharge paperwork. Patient discharged with all belongings. Anastasia Fiedler RN.

## 2012-04-29 NOTE — Progress Notes (Signed)
   CARE MANAGEMENT NOTE 04/29/2012  Patient:  Mark French, Mark French   Account Number:  [id number]  Date Initiated:  04/29/2012  Documentation initiated by:  [facility]  Subjective/Objective Assessment:   CHF     Action/Plan:   Anticipated DC Date:  04/28/2012   Anticipated DC Plan:  HOME W HOME HEALTH SERVICES      DC Planning Services  CM consult      [facility] Choice  Resumption Of Svcs/PTA Provider   Choice offered to / List presented to:          [facility] arranged  HH-1 RN  HH-10 DISEASE MANAGEMENT      HH agency  [facility]   Status of service:  Completed, signed off Medicare Important Message given?   (If response is "NO", the following Medicare IM given date fields will be blank) Date Medicare IM given:   Date Additional Medicare IM given:    Discharge Disposition:  HOME W HOME HEALTH SERVICES  Per UR Regulation:    If discussed at Long Length of Stay Meetings, dates discussed:    Comments:  04/29/2012 1030 Spoke to [facility] rep, [place]. Pt is a resumption of care, they will follow up with patient post d/c for [facility] RN. Isidoro Donning RN CCM Case Mgmt phone [phone]

## 2012-04-30 ENCOUNTER — Other Ambulatory Visit: Payer: Self-pay | Admitting: *Deleted

## 2012-04-30 ENCOUNTER — Other Ambulatory Visit: Payer: Self-pay | Admitting: Endocrinology

## 2012-04-30 MED ORDER — FUROSEMIDE 80 MG PO TABS: 80.0000 mg | ORAL_TABLET | Freq: Two times a day (BID) | ORAL | Status: DC

## 2012-04-30 NOTE — Telephone Encounter (Signed)
R'cd fax from [facility] for refill of Furosemide.  

## 2012-05-01 ENCOUNTER — Telehealth: Payer: Self-pay | Admitting: *Deleted

## 2012-05-01 NOTE — Telephone Encounter (Signed)
[facility] HHRN informed of verbal order.

## 2012-05-01 NOTE — Telephone Encounter (Signed)
[facility] RN called for verbal order for wound care for pt for calcium alginate and a Unna Boot for swelling-pt was d/cd from hospital over weekend for heart failure and has new wounds on his lower legs-please advise.

## 2012-05-01 NOTE — Telephone Encounter (Signed)
ok 

## 2012-05-07 ENCOUNTER — Telehealth: Payer: Self-pay | Admitting: Cardiovascular Disease

## 2012-05-07 NOTE — Telephone Encounter (Signed)
I spoke with Mark French and the pt is scheduled for a post hospital appointment with Dr Excell Seltzer on 05/13/12.  She wanted to make [name] aware that the pt is having difficulty sleeping at night.  The pt goes to bed around 11 PM and then wakes up at 2:45 AM.  The pt will nap during the day.  [name] felt like the pt may need a sleep study.  Will plan on discussing with pt at appointment.

## 2012-05-07 NOTE — Telephone Encounter (Signed)
New problem:  Patient has mention over the last several weeks. C/O sob, trouble breathing at night, never during the day . Brother has sleep apena.   Can a revisit of sleep study be advise.

## 2012-05-13 ENCOUNTER — Ambulatory Visit (INDEPENDENT_AMBULATORY_CARE_PROVIDER_SITE_OTHER): Payer: Medicare Other | Admitting: Cardiovascular Disease

## 2012-05-13 ENCOUNTER — Encounter: Payer: Self-pay | Admitting: Cardiovascular Disease

## 2012-05-13 VITALS — BP 96/44 | HR 72 | Ht 66.0 in | Wt 230.0 lb

## 2012-05-13 DIAGNOSIS — I509 Heart failure, unspecified: Secondary | ICD-10-CM

## 2012-05-13 MED ORDER — TORSEMIDE 20 MG PO TABS: ORAL_TABLET | ORAL | Status: DC

## 2012-05-13 NOTE — Progress Notes (Signed)
HPI:  66 year old gentleman presenting for followup evaluation. The patient has extensive CAD status post CABG and severe LV dysfunction. He's had multiple hospital evaluation as an admissions for congestive heart failure. His most recent hospitalization was September 20. He also has problems related to chronic edema with venous insufficiency and peripheral arterial disease. He has chronic kidney disease and very poor functional capacity. Chronic leg wounds are managed at the wound care center.  The patient feels back to his baseline. He is weighing himself regularly at home and reports mild increase in weight but no major change. He continues to have dyspnea with exertion but not at rest. He denies chest pain or pressure. His leg swelling is been a little better with daily support hose. He realizes that there has been a moderate amount of salt added to his food recently. His wife does the cooking. He was under the impression he was not eating any salt at all. He is compliant with his medications.  Outpatient Encounter Prescriptions as of 05/13/2012  Medication Sig Dispense Refill  . albuterol (PROVENTIL HFA;VENTOLIN HFA) 108 (90 BASE) MCG/ACT inhaler Inhale 2 puffs into the lungs every 6 (six) hours as needed. For shortness of breath      . aspirin EC 325 MG tablet Take 81 mg by mouth daily.      [name] atorvastatin (LIPITOR) 40 MG tablet Take 1 tablet (40 mg total) by mouth daily.  30 tablet  11  . carvedilol (COREG) 6.25 MG tablet Take 6.25 mg by mouth 2 (two) times daily with a meal.      . clopidogrel (PLAVIX) 75 MG tablet Take 75 mg by mouth daily with breakfast.      . Docusate Sodium (STOOL SOFTENER) 100 MG capsule Take 100 mg by mouth daily as needed. constipation      . Fluticasone-Salmeterol (ADVAIR DISKUS) 100-50 MCG/DOSE AEPB Inhale 1 puff into the lungs 2 (two) times daily.  1 each  11  . furosemide (LASIX) 80 MG tablet Take 1 tablet (80 mg total) by mouth 2 (two) times daily.  60 tablet  11   . gabapentin (NEURONTIN) 600 MG tablet Take 1 tablet (600 mg total) by mouth 2 (two) times daily.  60 tablet  11  . HYDROcodone-acetaminophen (NORCO) 10-325 MG per tablet Take 1 tablet by mouth every 4 (four) hours as needed. For pain  120 tablet  5  . insulin NPH (HUMULIN N) 100 UNIT/ML injection Inject 60 Units into the skin every morning.  20 mL  11  . isosorbide mononitrate (IMDUR) 30 MG 24 hr tablet Take 30 mg by mouth daily.      [name] losartan (COZAAR) 50 MG tablet Take 50 mg by mouth daily.      . pantoprazole (PROTONIX) 40 MG tablet TAKE 1 TABLET BY MOUTH DAILY  30 tablet  10  . potassium chloride SA (K-DUR,KLOR-CON) 20 MEQ tablet Take 20 mEq by mouth daily.      [name] spironolactone (ALDACTONE) 25 MG tablet Take 12.5 mg by mouth daily.      . Tamsulosin HCl (FLOMAX) 0.4 MG CAPS Take 0.4 mg by mouth daily.      [name] tiotropium (SPIRIVA) 18 MCG inhalation capsule Place 1 capsule (18 mcg total) into inhaler and inhale daily.  30 capsule  6    Allergies  Allergen Reactions  . Sildenafil Nausea Only and Other (See Comments)    "took it once; heart went to pieces; never took it again"  . Contrast  Media (Iodinated Diagnostic Agents) Nausea Only    Past Medical History  Diagnosis Date  . CORONARY ARTERY DISEASE 03/11/2007    s/p CABG 1998;  admitted 08/2011 with an inferior STEMI.  LHC 09/07/11: Severe  Three-vessel CAD, LIMA-LAD patent, SVG-RCA chronically occluded, SVG-circumflex chronically occluded, SVG-diagonal 95%.  PCI: Promus DES to the SVG-diagonal.  Procedure c/b transient CHB req CPR and TTVP;  myoview 4/13:large severe inferolateral infarct, no ischemia, EF 25%    . DIABETES MELLITUS, TYPE I 03/11/2007  . HYPERTENSION 03/11/2007  . 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 HYPERCHOLESTEROLEMIA 09/28/2008  . CERVICAL RADICULOPATHY, LEFT 09/23/2008  . PERIPHERAL NEUROPATHY 09/23/2008  . Chronic systolic heart failure 09/23/2008    Echocardiogram 09/07/11: Difficult study, inferior  and posterior lateral wall hypokinesis, mild LVH, EF 45%, mild LAE.   [name] RENAL INSUFFICIENCY 03/15/2010  . ED (erectile dysfunction)   . Hypogonadism male   . GI bleed 2012    Multiple Duodenal ulcer   . Obesity   . Legally blind     bilaterally  . Stroke 50    age 6 months; residual right sided weakness  . History of blood transfusion 2012  . Charcot foot due to diabetes mellitus     chronic pain  . Osteoarthritis     right hip and shoulder  . COPD (chronic obstructive pulmonary disease)   . Myocardial infarction 2013b Jan  . PERIPHERAL VASCULAR INSUFFICIENCY,  LEGS, BILATERAL 08/17/2010    ABI's performed 04/19/11 R 0.72 L 0.71  . ANEMIA-IRON DEFICIENCY 09/23/2008    ulcer related last year    ROS: Negative except as per HPI  There were no vitals taken for this visit.  PHYSICAL EXAM: Pt is alert and oriented, chronically ill-appearing male in NAD HEENT: normal Neck: JVP - normal Lungs: CTA bilaterally CV: RRR without murmur or gallop Abd: soft, NT, Positive BS Ext: 1+ bilateral pretibial edema, support stockings in place  ASSESSMENT AND PLAN: 1. Acute on chronic systolic heart failure. The patient remains chronically ill without much room to titrate up his CHF regimen. His blood pressure runs in the low normal to borderline range. He is on a program that includes carvedilol, furosemide, isosorbide, losartan, and Aldactone. I think we may do better with a different diuretic. When he runs out of furosemide, he will switch to Demadex 40 mg in the morning and 20 mg in the afternoon. I would like to see him back in 4 weeks with a followup metabolic panel at the time of his visit.  2. Coronary artery disease, status post CABG. Continue current medical program.  3. Peripheral arterial disease with a combination of arterial and venous disease involving the legs. Continue conservative care and wound management.  I have reviewed the patient's extensive records, including recent  hospital notes, most recent echo, and most recent cardiac catheterization data. I don't think there is much else we can do for Mr. Sapia other than adjusting his diuretic at this time.  Tonny Bollman 05/13/2012 1:53 PM

## 2012-05-13 NOTE — Patient Instructions (Addendum)
Your physician has recommended you make the following change in your medication:   When done with Lasix start demadex 40 mg in the morning and 20 mg  Around 2 pm  Your physician recommends that you return for lab work in: [facility] Monday// [facility]   App here tuesday

## 2012-05-22 ENCOUNTER — Other Ambulatory Visit: Payer: Self-pay | Admitting: Cardiology

## 2012-05-22 MED ORDER — POTASSIUM CHLORIDE CRYS ER 20 MEQ PO TBCR: 20.0000 meq | EXTENDED_RELEASE_TABLET | Freq: Every day | ORAL | Status: DC

## 2012-05-23 ENCOUNTER — Other Ambulatory Visit: Payer: Self-pay | Admitting: Endocrinology

## 2012-05-23 NOTE — Telephone Encounter (Signed)
please call patient: What dr prescribes this for you?

## 2012-05-23 NOTE — Telephone Encounter (Signed)
Pt's wife reports that she called in a refill of pt's Plavix on Monday but it still has not been filled. Pt is down to one pill. Please fill rx and advise wife.

## 2012-05-23 NOTE — Telephone Encounter (Signed)
Per pt's pharmacy, you were the prescribing doctor.

## 2012-05-23 NOTE — Telephone Encounter (Signed)
[name], ok to refill this med?  Under med time it says it was d/c just a couple days after you prescribed.

## 2012-05-24 ENCOUNTER — Other Ambulatory Visit: Payer: Self-pay | Admitting: Cardiovascular Disease

## 2012-05-24 MED ORDER — CLOPIDOGREL BISULFATE 75 MG PO TABS: 75.0000 mg | ORAL_TABLET | Freq: Every day | ORAL | Status: DC

## 2012-05-24 NOTE — Telephone Encounter (Signed)
Dr cooper sent me a staff message saying he'll do the refill.

## 2012-05-27 ENCOUNTER — Other Ambulatory Visit: Payer: Self-pay | Admitting: Endocrinology

## 2012-05-28 ENCOUNTER — Telehealth: Payer: Self-pay | Admitting: Cardiovascular Disease

## 2012-05-28 NOTE — Telephone Encounter (Signed)
plz return call to [facility]  [phone]   Regarding patient issues with [facility]

## 2012-05-29 NOTE — Telephone Encounter (Signed)
I spoke with Mark French and she wanted to make our office aware that the pt is not compliant with wearing TED hoses.  The TED hose make the pt's neuropathy worse and he can only wear them about an hour and a half.  The pt has started using a Unna boot twice a week and this is helping legs.  The pt's weight is steady and he is being compliant with medications and diet. She will continue to see the pt in the home.

## 2012-06-03 ENCOUNTER — Telehealth: Payer: Self-pay | Admitting: *Deleted

## 2012-06-03 NOTE — Telephone Encounter (Signed)
Can we discuss this at an ov?  Please bring cbg record

## 2012-06-03 NOTE — Telephone Encounter (Signed)
Pts wife called stating pt has had a few glucose readings over 200. She is requesting pt go back on Humulin R. LOV 04/16/12. Please advise.

## 2012-06-04 NOTE — Telephone Encounter (Signed)
Called pt and scheduled a follow up appt to discuss change of medication. Advised pt to bring cbg record. Pt understands.

## 2012-06-05 ENCOUNTER — Encounter: Payer: Self-pay | Admitting: Endocrinology

## 2012-06-05 ENCOUNTER — Ambulatory Visit (INDEPENDENT_AMBULATORY_CARE_PROVIDER_SITE_OTHER): Payer: Medicare Other | Admitting: Endocrinology

## 2012-06-05 VITALS — BP 130/72 | HR 77 | Temp 98.3°F | Wt 227.0 lb

## 2012-06-05 DIAGNOSIS — E119 Type 2 diabetes mellitus without complications: Secondary | ICD-10-CM

## 2012-06-05 DIAGNOSIS — Z23 Encounter for immunization: Secondary | ICD-10-CM

## 2012-06-05 MED ORDER — INSULIN NPH (HUMAN) (ISOPHANE) 100 UNIT/ML ~~LOC~~ SUSP: 65.0000 [IU] | Freq: Every morning | SUBCUTANEOUS | Status: DC

## 2012-06-05 MED ORDER — CLOPIDOGREL BISULFATE 75 MG PO TABS: 75.0000 mg | ORAL_TABLET | Freq: Every day | ORAL | Status: DC

## 2012-06-05 NOTE — Patient Instructions (Addendum)
Please increase the nph insulin to 65 units each morning.   Please come back for a follow-up appointment in January.   check your blood sugar twice a day.  vary the time of day when you check, between before the 3 meals, and at bedtime.  also check if you have symptoms of your blood sugar being too high or too low.  please keep a record of the readings and bring it to your next appointment here.  please call us sooner if your blood sugar goes below 70, or if you have a lot of readings over 200.

## 2012-06-05 NOTE — Progress Notes (Signed)
Subjective:    Patient ID: Mark French, male    DOB: 09-25-1945, 66 y.o.   MRN: [id number]  HPI Pt returns for f/u of insulin-requiring DM (dx'ed 1995; complicated by CAD, renal insufficiency, peripheral sensory neuropathy and retinopathy; therapy limited by pt's need for a simple, inexpensive insulin regimen).  no cbg record, but states cbg's vary from 85-200's.  It is in general higher as the day goes on.   Pt and wife want to add prn regular insulin.   Past Medical History  Diagnosis Date  . CORONARY ARTERY DISEASE 03/11/2007    s/p CABG 1998;  admitted 08/2011 with an inferior STEMI.  LHC 09/07/11: Severe  Three-vessel CAD, LIMA-LAD patent, SVG-RCA chronically occluded, SVG-circumflex chronically occluded, SVG-diagonal 95%.  PCI: Promus DES to the SVG-diagonal.  Procedure c/b transient CHB req CPR and TTVP;  myoview 4/13:large severe inferolateral infarct, no ischemia, EF 25%    . DIABETES MELLITUS, TYPE I 03/11/2007  . HYPERTENSION 03/11/2007  . 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 HYPERCHOLESTEROLEMIA 09/28/2008  . CERVICAL RADICULOPATHY, LEFT 09/23/2008  . PERIPHERAL NEUROPATHY 09/23/2008  . Chronic systolic heart failure 09/23/2008    Echocardiogram 09/07/11: Difficult study, inferior and posterior lateral wall hypokinesis, mild LVH, EF 45%, mild LAE.   [name] RENAL INSUFFICIENCY 03/15/2010  . ED (erectile dysfunction)   . Hypogonadism male   . GI bleed 2012    Multiple Duodenal ulcer   . Obesity   . Legally blind     bilaterally  . Stroke 42    age 46 months; residual right sided weakness  . History of blood transfusion 2012  . Charcot foot due to diabetes mellitus     chronic pain  . Osteoarthritis     right hip and shoulder  . COPD (chronic obstructive pulmonary disease)   . Myocardial infarction 2013b Jan  . PERIPHERAL VASCULAR INSUFFICIENCY,  LEGS, BILATERAL 08/17/2010    ABI's performed 04/19/11 R 0.72 L 0.71  . ANEMIA-IRON DEFICIENCY 09/23/2008    ulcer related  last year    Past Surgical History  Procedure Date  . Carpal tunnel release     left  . Sigmoidoscopy 03/11/2001  . Venous doppler 01/30/2004  . Tonsillectomy and adenoidectomy     "as a kid"  . Cataract extraction w/ intraocular lens  implant, bilateral   . Coronary angioplasty with stent placement 08/2011    "1"  . Retinopathy surgery 2000's    "laser; both eyes"  . Wrist fusion 1976    right  . Anterior fusion cervical spine 2010    "C spine; Dr. Yetta Barre"  . Ankle fracture surgery 1976; ?date    LEFT:  fused; removed bulk of hardware  . Heel spur surgery ~ 2007    ? left  . Irrigation and debridement knee 02/27/2012    Procedure: IRRIGATION AND DEBRIDEMENT KNEE;  Surgeon: Verlee Rossetti, MD;  Location: [facility] OR;  Service: Orthopedics;  Laterality: Right;  irrigation and drainage right knee septic bursitis  . Eye surgery 2008    cataract removal, cautery  . Coronary artery bypass graft 1998    CABG X5  . Fracture surgery     foot broken 1976    History   Social History  . Marital Status: Married    Spouse Name: N/A    Number of Children: 0  . Years of Education: N/A   Occupational History  . Disabled    Social History Main Topics  . Smoking status: Never Smoker   .  Smokeless tobacco: Never Used  . Alcohol Use: No  . Drug Use: No  . Sexually Active: No   Other Topics Concern  . Not on file   Social History Narrative   Comes to appointments with wife0 Caffeine drinks daily     Current Outpatient Prescriptions on File Prior to Visit  Medication Sig Dispense Refill  . albuterol (PROVENTIL HFA;VENTOLIN HFA) 108 (90 BASE) MCG/ACT inhaler Inhale 2 puffs into the lungs every 6 (six) hours as needed. For shortness of breath      . aspirin EC 325 MG tablet Take 81 mg by mouth daily.      [name] atorvastatin (LIPITOR) 40 MG tablet Take 1 tablet (40 mg total) by mouth daily.  30 tablet  11  . carvedilol (COREG) 6.25 MG tablet TAKE 1 TABLET BY MOUTH TWICE DAILY WITH A MEAL   60 tablet  4  . Docusate Sodium (STOOL SOFTENER) 100 MG capsule Take 100 mg by mouth daily as needed. constipation      . Ferrous Sulfate 90 (18 FE) MG TABS Take 18 mg of iron by mouth daily.      . Fluticasone-Salmeterol (ADVAIR DISKUS) 100-50 MCG/DOSE AEPB Inhale 1 puff into the lungs 2 (two) times daily.  1 each  11  . gabapentin (NEURONTIN) 600 MG tablet Take 1 tablet (600 mg total) by mouth 2 (two) times daily.  60 tablet  11  . HYDROcodone-acetaminophen (NORCO) 10-325 MG per tablet Take 1 tablet by mouth every 4 (four) hours as needed. For pain  120 tablet  5  . isosorbide mononitrate (IMDUR) 30 MG 24 hr tablet Take 30 mg by mouth daily.      [name] losartan (COZAAR) 50 MG tablet Take 50 mg by mouth daily.      . pantoprazole (PROTONIX) 40 MG tablet TAKE 1 TABLET BY MOUTH DAILY  30 tablet  10  . potassium chloride SA (K-DUR,KLOR-CON) 20 MEQ tablet Take 1 tablet (20 mEq total) by mouth daily.  30 tablet  6  . spironolactone (ALDACTONE) 25 MG tablet Take 12.5 mg by mouth daily.      . Tamsulosin HCl (FLOMAX) 0.4 MG CAPS Take 0.4 mg by mouth daily.      [name] tiotropium (SPIRIVA) 18 MCG inhalation capsule Place 1 capsule (18 mcg total) into inhaler and inhale daily.  30 capsule  6  . torsemide (DEMADEX) 20 MG tablet 2 tablets (40 mg) in the morning and one tablet (20 mg) in afternoon around 2 pm  90 tablet  5    Allergies  Allergen Reactions  . Sildenafil Nausea Only and Other (See Comments)    "took it once; heart went to pieces; never took it again"  . Contrast Media (Iodinated Diagnostic Agents) Nausea Only    Family History  Problem Relation Age of Onset  . Heart disease Mother     Coronary Artery Disease  . Heart disease Father     Coronary Artery Disease  . Heart disease Paternal Grandmother     Coronary Artery Disease  . Colon cancer Neg Hx   . Diabetes Other     Grandmother    BP 130/72  Pulse 77  Temp 98.3 F (36.8 C) (Oral)  Wt 227 lb (102.967 kg)  SpO2 99%  Review of  Systems denies hypoglycemia.    Objective:   Physical Exam VITAL SIGNS:  See vs page GENERAL: no distress; on scooter SKIN:  Insulin injection sites at the anterior abdomen are normal.  Assessment & Plan:  Insulin-requiring DM, therapy limited by pt's need for a simple regimen.  needs increased rx. he declines qac reg insulin.

## 2012-06-10 ENCOUNTER — Telehealth: Payer: Self-pay | Admitting: Cardiovascular Disease

## 2012-06-10 NOTE — Telephone Encounter (Signed)
plz return call to patient wife [phone] (Until 5pm). Pt has gained 5 lbs over the last couple days, wife wonders if medication needs to be adjusted

## 2012-06-10 NOTE — Telephone Encounter (Signed)
Reviewed message and will call the pt's wife on 06/11/12.  It is after 5 PM.

## 2012-06-11 ENCOUNTER — Telehealth: Payer: Self-pay | Admitting: Cardiovascular Disease

## 2012-06-11 NOTE — Telephone Encounter (Signed)
I spoke with the pt's wife and she said the pt's weight is fluctuating and he may be "a little more SOB than normal".   Weight: 10/31 220, 11/2 228, 11/3 229, 11/4 228 and 11/5 226. The pt is currently taking Demadex 40mg  in the morning and 20mg  in the afternoon.  I instructed the pt's wife to have the pt take Demadex 40mg  this morning and 40mg  this afternoon today only, return to previous dose tomorrow.  The pt will continue to monitor his weight and call the office if symptoms progress. The pt is scheduled to see Dr Excell Seltzer on 06/18/12. Wife agreed with plan.

## 2012-06-11 NOTE — Telephone Encounter (Signed)
Pt has a open blister and lung sounds are sounding tight no wheezing just tight. He is extra dose of torsimide and he has edema LE and that is what nurse is calling about need order for [facility] boots

## 2012-06-12 NOTE — Telephone Encounter (Signed)
I spoke with Nehemiah Settle and made her aware that it is okay to do UNNA boots again.  The pt had good results with this in the past.  Nehemiah Settle is aware that I spoke with the pt's wife yesterday about taking extra torsemide. Nehemiah Settle also asked who the pt sees for Pulmonary Care.  I made her aware that he sees Dr Marchelle Gearing and the pt currently does not have any appointments scheduled.  Nehemiah Settle will continue to evaluate the pt.

## 2012-06-13 ENCOUNTER — Telehealth: Payer: Self-pay | Admitting: Cardiovascular Disease

## 2012-06-13 NOTE — Telephone Encounter (Signed)
I spoke with the pt's wife and the pt's weight today was 227.  The pt's SOB and swelling are the same as when I spoke with her on 06/11/12 and it has not worsened.  I made her aware at this time to continue weighing daily and if the pt's weight gets up to 228 or 229 then the pt can take an extra 20mg  of Demadex with afternoon dose.  They are limiting salt intake in the diet.  The pt will see Dr Excell Seltzer on 06/18/12.

## 2012-06-13 NOTE — Telephone Encounter (Signed)
New Problem:    Patient's wife called in because her husband is retaining fluid and despite taking an extra fluid pill his weight is still up.  Please call back.

## 2012-06-17 ENCOUNTER — Other Ambulatory Visit (INDEPENDENT_AMBULATORY_CARE_PROVIDER_SITE_OTHER): Payer: Medicare Other

## 2012-06-17 DIAGNOSIS — I509 Heart failure, unspecified: Secondary | ICD-10-CM

## 2012-06-17 LAB — BASIC METABOLIC PANEL
BUN: 41 mg/dL — ABNORMAL HIGH (ref 6–23)
CO2: 31 mEq/L (ref 19–32)
Calcium: 8.9 mg/dL (ref 8.4–10.5)
GFR: 45.81 mL/min — ABNORMAL LOW (ref >60.00)
Glucose, Bld: 106 mg/dL — ABNORMAL HIGH (ref 70–99)
Potassium: 4.5 mEq/L (ref 3.5–5.1)
Sodium: 136 mEq/L (ref 135–145)

## 2012-06-18 ENCOUNTER — Ambulatory Visit (INDEPENDENT_AMBULATORY_CARE_PROVIDER_SITE_OTHER): Payer: Medicare Other | Admitting: Cardiovascular Disease

## 2012-06-18 ENCOUNTER — Encounter: Payer: Self-pay | Admitting: Cardiovascular Disease

## 2012-06-18 VITALS — BP 116/58 | HR 58 | Ht 66.0 in | Wt 229.8 lb

## 2012-06-18 DIAGNOSIS — I5022 Chronic systolic (congestive) heart failure: Secondary | ICD-10-CM

## 2012-06-18 NOTE — Patient Instructions (Addendum)
Your physician recommends that you schedule a follow-up appointment in: 3 MONTHS  Your physician recommends that you continue on your current medications as directed. Please refer to the Current Medication list given to you today.   

## 2012-06-18 NOTE — Progress Notes (Signed)
HPI:  66 year old gentleman, well-known to me, presented for followup evaluation. The patient has chronic systolic congestive heart failure secondary to severe ischemic cardiomyopathy. He has a history of CABG and PCI with a myocardial infarction earlier this year. He is essentially wheelchair-bound and uses a motorized scooter.  For the last several weeks he has been doing fairly well. He's managed to stay out of the hospital. He has chronic shortness of breath but no recent change. He denies any recent chest pain or pressure. His leg swelling is unchanged. His weights are stable since he was last seen.  Outpatient Encounter Prescriptions as of 06/18/2012  Medication Sig Dispense Refill  . albuterol (PROVENTIL HFA;VENTOLIN HFA) 108 (90 BASE) MCG/ACT inhaler Inhale 2 puffs into the lungs every 6 (six) hours as needed. For shortness of breath      . aspirin EC 325 MG tablet Take 81 mg by mouth daily.      [name] atorvastatin (LIPITOR) 40 MG tablet Take 1 tablet (40 mg total) by mouth daily.  30 tablet  11  . carvedilol (COREG) 6.25 MG tablet TAKE 1 TABLET BY MOUTH TWICE DAILY WITH A MEAL  60 tablet  4  . clopidogrel (PLAVIX) 75 MG tablet Take 1 tablet (75 mg total) by mouth daily with breakfast.  90 tablet  3  . Docusate Sodium (STOOL SOFTENER) 100 MG capsule Take 100 mg by mouth daily as needed. constipation      . Ferrous Sulfate 90 (18 FE) MG TABS Take 18 mg of iron by mouth daily.      . Fluticasone-Salmeterol (ADVAIR DISKUS) 100-50 MCG/DOSE AEPB Inhale 1 puff into the lungs 2 (two) times daily.  1 each  11  . gabapentin (NEURONTIN) 600 MG tablet Take 1 tablet (600 mg total) by mouth 2 (two) times daily.  60 tablet  11  . HYDROcodone-acetaminophen (NORCO) 10-325 MG per tablet Take 1 tablet by mouth every 4 (four) hours as needed. For pain  120 tablet  5  . insulin NPH (HUMULIN N,NOVOLIN N) 100 UNIT/ML injection Inject 65 Units into the skin every morning.  2 vial  11  . isosorbide mononitrate  (IMDUR) 30 MG 24 hr tablet Take 30 mg by mouth daily.      [name] losartan (COZAAR) 50 MG tablet Take 50 mg by mouth daily.      . pantoprazole (PROTONIX) 40 MG tablet TAKE 1 TABLET BY MOUTH DAILY  30 tablet  10  . potassium chloride SA (K-DUR,KLOR-CON) 20 MEQ tablet Take 1 tablet (20 mEq total) by mouth daily.  30 tablet  6  . spironolactone (ALDACTONE) 25 MG tablet Take 12.5 mg by mouth daily.      . Tamsulosin HCl (FLOMAX) 0.4 MG CAPS Take 0.4 mg by mouth daily.      [name] tiotropium (SPIRIVA) 18 MCG inhalation capsule Place 1 capsule (18 mcg total) into inhaler and inhale daily.  30 capsule  6  . torsemide (DEMADEX) 20 MG tablet 2 tablets (40 mg) in the morning and one tablet (20 mg) in afternoon around 2 pm  90 tablet  5    Allergies  Allergen Reactions  . Sildenafil Nausea Only and Other (See Comments)    "took it once; heart went to pieces; never took it again"  . Contrast Media (Iodinated Diagnostic Agents) Nausea Only    Past Medical History  Diagnosis Date  . CORONARY ARTERY DISEASE 03/11/2007    s/p CABG 1998;  admitted 08/2011 with an inferior STEMI.  LHC 09/07/11: Severe  Three-vessel CAD, LIMA-LAD patent, SVG-RCA chronically occluded, SVG-circumflex chronically occluded, SVG-diagonal 95%.  PCI: Promus DES to the SVG-diagonal.  Procedure c/b transient CHB req CPR and TTVP;  myoview 4/13:large severe inferolateral infarct, no ischemia, EF 25%    . DIABETES MELLITUS, TYPE I 03/11/2007  . HYPERTENSION 03/11/2007  . 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 HYPERCHOLESTEROLEMIA 09/28/2008  . CERVICAL RADICULOPATHY, LEFT 09/23/2008  . PERIPHERAL NEUROPATHY 09/23/2008  . Chronic systolic heart failure 09/23/2008    Echocardiogram 09/07/11: Difficult study, inferior and posterior lateral wall hypokinesis, mild LVH, EF 45%, mild LAE.   [name] RENAL INSUFFICIENCY 03/15/2010  . ED (erectile dysfunction)   . Hypogonadism male   . GI bleed 2012    Multiple Duodenal ulcer   . Obesity   . Legally  blind     bilaterally  . Stroke 49    age 11 months; residual right sided weakness  . History of blood transfusion 2012  . Charcot foot due to diabetes mellitus     chronic pain  . Osteoarthritis     right hip and shoulder  . COPD (chronic obstructive pulmonary disease)   . Myocardial infarction 2013b Jan  . PERIPHERAL VASCULAR INSUFFICIENCY,  LEGS, BILATERAL 08/17/2010    ABI's performed 04/19/11 R 0.72 L 0.71  . ANEMIA-IRON DEFICIENCY 09/23/2008    ulcer related last year    ROS: Negative except as per HPI  BP 116/58  Pulse 58  Ht 5\' 6"  (1.676 m)  Wt 104.237 kg (229 lb 12.8 oz)  BMI 37.09 kg/m2  SpO2 99%  PHYSICAL EXAM: Pt is alert and oriented, chronically ill gentleman in NAD HEENT: normal Neck: JVP - normal Lungs: CTA bilaterally CV: RRR without murmur or gallop Abd: soft, NT, obese Ext: 2+ posterior calf swelling on the right and 1+ posterior Swelling on the left unchanged from baseline Skin: warm/dry no rash  ASSESSMENT AND PLAN: 1. Chronic systolic heart failure. The patient remains stable. He will continue with the same medical program. From a perspective of his diuretic dosing, we will continue him on torsemide 40 mg in the morning and 20 mg in the afternoon. If he gains over 3 pounds in any 24 hour period or 5 pounds from baseline, he will increase his torsemide to 40 mg twice daily until back to baseline weight. He will contact us if this strategy is not working. He's doing a good job of checking daily weights and recording them.  2. CAD status post CABG. Stable without anginal symptoms.  For followup I would like to see him back in 3 months.  Tonny Bollman 06/18/2012 6:36 PM

## 2012-06-25 ENCOUNTER — Ambulatory Visit (INDEPENDENT_AMBULATORY_CARE_PROVIDER_SITE_OTHER): Payer: Medicare Other | Admitting: Endocrinology

## 2012-06-25 VITALS — BP 132/76 | HR 62 | Temp 98.0°F | Wt 230.0 lb

## 2012-06-25 DIAGNOSIS — N400 Enlarged prostate without lower urinary tract symptoms: Secondary | ICD-10-CM

## 2012-06-25 MED ORDER — GABAPENTIN 600 MG PO TABS: 600.0000 mg | ORAL_TABLET | Freq: Three times a day (TID) | ORAL | Status: DC

## 2012-06-25 NOTE — Progress Notes (Signed)
Subjective:    Patient ID: Mark French, male    DOB: 1946-06-03, 66 y.o.   MRN: [id number]  HPI Pt states of decreased urinary stream at the urethra, bu no assoc dysuria.   Past Medical History  Diagnosis Date  . CORONARY ARTERY DISEASE 03/11/2007    s/p CABG 1998;  admitted 08/2011 with an inferior STEMI.  LHC 09/07/11: Severe  Three-vessel CAD, LIMA-LAD patent, SVG-RCA chronically occluded, SVG-circumflex chronically occluded, SVG-diagonal 95%.  PCI: Promus DES to the SVG-diagonal.  Procedure c/b transient CHB req CPR and TTVP;  myoview 4/13:large severe inferolateral infarct, no ischemia, EF 25%    . DIABETES MELLITUS, TYPE I 03/11/2007  . HYPERTENSION 03/11/2007  . 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 HYPERCHOLESTEROLEMIA 09/28/2008  . CERVICAL RADICULOPATHY, LEFT 09/23/2008  . PERIPHERAL NEUROPATHY 09/23/2008  . Chronic systolic heart failure 09/23/2008    Echocardiogram 09/07/11: Difficult study, inferior and posterior lateral wall hypokinesis, mild LVH, EF 45%, mild LAE.   [name] RENAL INSUFFICIENCY 03/15/2010  . ED (erectile dysfunction)   . Hypogonadism male   . GI bleed 2012    Multiple Duodenal ulcer   . Obesity   . Legally blind     bilaterally  . Stroke 28    age 19 months; residual right sided weakness  . History of blood transfusion 2012  . Charcot foot due to diabetes mellitus     chronic pain  . Osteoarthritis     right hip and shoulder  . COPD (chronic obstructive pulmonary disease)   . Myocardial infarction 2013b Jan  . PERIPHERAL VASCULAR INSUFFICIENCY,  LEGS, BILATERAL 08/17/2010    ABI's performed 04/19/11 R 0.72 L 0.71  . ANEMIA-IRON DEFICIENCY 09/23/2008    ulcer related last year    Past Surgical History  Procedure Date  . Carpal tunnel release     left  . Sigmoidoscopy 03/11/2001  . Venous doppler 01/30/2004  . Tonsillectomy and adenoidectomy     "as a kid"  . Cataract extraction w/ intraocular lens  implant, bilateral   . Coronary  angioplasty with stent placement 08/2011    "1"  . Retinopathy surgery 2000's    "laser; both eyes"  . Wrist fusion 1976    right  . Anterior fusion cervical spine 2010    "C spine; Dr. Yetta Barre"  . Ankle fracture surgery 1976; ?date    LEFT:  fused; removed bulk of hardware  . Heel spur surgery ~ 2007    ? left  . Irrigation and debridement knee 02/27/2012    Procedure: IRRIGATION AND DEBRIDEMENT KNEE;  Surgeon: Verlee Rossetti, MD;  Location: [facility] OR;  Service: Orthopedics;  Laterality: Right;  irrigation and drainage right knee septic bursitis  . Eye surgery 2008    cataract removal, cautery  . Coronary artery bypass graft 1998    CABG X5  . Fracture surgery     foot broken 1976    History   Social History  . Marital Status: Married    Spouse Name: N/A    Number of Children: 0  . Years of Education: N/A   Occupational History  . Disabled    Social History Main Topics  . Smoking status: Never Smoker   . Smokeless tobacco: Never Used  . Alcohol Use: No  . Drug Use: No  . Sexually Active: No   Other Topics Concern  . Not on file   Social History Narrative   Comes to appointments with wife0 Caffeine drinks daily  Current Outpatient Prescriptions on File Prior to Visit  Medication Sig Dispense Refill  . albuterol (PROVENTIL HFA;VENTOLIN HFA) 108 (90 BASE) MCG/ACT inhaler Inhale 2 puffs into the lungs every 6 (six) hours as needed. For shortness of breath      . aspirin EC 325 MG tablet Take 81 mg by mouth daily.      [name] atorvastatin (LIPITOR) 40 MG tablet Take 1 tablet (40 mg total) by mouth daily.  30 tablet  11  . carvedilol (COREG) 6.25 MG tablet TAKE 1 TABLET BY MOUTH TWICE DAILY WITH A MEAL  60 tablet  4  . clopidogrel (PLAVIX) 75 MG tablet Take 1 tablet (75 mg total) by mouth daily with breakfast.  90 tablet  3  . Docusate Sodium (STOOL SOFTENER) 100 MG capsule Take 100 mg by mouth daily as needed. constipation      . Ferrous Sulfate 90 (18 FE) MG TABS Take 18 mg  of iron by mouth daily.      . Fluticasone-Salmeterol (ADVAIR DISKUS) 100-50 MCG/DOSE AEPB Inhale 1 puff into the lungs 2 (two) times daily.  1 each  11  . HYDROcodone-acetaminophen (NORCO) 10-325 MG per tablet Take 1 tablet by mouth every 4 (four) hours as needed. For pain  120 tablet  5  . insulin NPH (HUMULIN N,NOVOLIN N) 100 UNIT/ML injection Inject 65 Units into the skin every morning.  2 vial  11  . isosorbide mononitrate (IMDUR) 30 MG 24 hr tablet Take 30 mg by mouth daily.      [name] losartan (COZAAR) 50 MG tablet Take 50 mg by mouth daily.      . pantoprazole (PROTONIX) 40 MG tablet TAKE 1 TABLET BY MOUTH DAILY  30 tablet  10  . potassium chloride SA (K-DUR,KLOR-CON) 20 MEQ tablet Take 1 tablet (20 mEq total) by mouth daily.  30 tablet  6  . spironolactone (ALDACTONE) 25 MG tablet Take 12.5 mg by mouth daily.      . Tamsulosin HCl (FLOMAX) 0.4 MG CAPS Take 0.4 mg by mouth daily.      [name] tiotropium (SPIRIVA) 18 MCG inhalation capsule Place 1 capsule (18 mcg total) into inhaler and inhale daily.  30 capsule  6  . torsemide (DEMADEX) 20 MG tablet 2 tablets (40 mg) in the morning and one tablet (20 mg) in afternoon around 2 pm  90 tablet  5    Allergies  Allergen Reactions  . Sildenafil Nausea Only and Other (See Comments)    "took it once; heart went to pieces; never took it again"  . Contrast Media (Iodinated Diagnostic Agents) Nausea Only    Family History  Problem Relation Age of Onset  . Heart disease Mother     Coronary Artery Disease  . Heart disease Father     Coronary Artery Disease  . Heart disease Paternal Grandmother     Coronary Artery Disease  . Colon cancer Neg Hx   . Diabetes Other     Grandmother    BP 132/76  Pulse 62  Temp 98 F (36.7 C) (Oral)  Wt 230 lb (104.327 kg)  SpO2 96%   Review of Systems Neuropathic pain is not getting better.  He has gained a few lbs    Objective:   Physical Exam VITAL SIGNS:  See vs page GENERAL: no distress Ext: 1+  bilat leg edema     Assessment & Plan:  Prostatism, worse Painful neuropathy, worse Weight gain, ? exac of chf

## 2012-06-25 NOTE — Patient Instructions (Addendum)
Refer to a urology specialist.  you will receive a phone call, about a day and time for an appointment.  Please increase the gabapentin to 3x a day. Please increase the torsemide as dr cooper said, until your weight comes back down.

## 2012-06-26 ENCOUNTER — Telehealth: Payer: Self-pay

## 2012-06-26 NOTE — Telephone Encounter (Signed)
Please increase insulin to 70 units qam

## 2012-06-26 NOTE — Telephone Encounter (Signed)
Pt states his blood sugar went up to 230 around 12:30/ 1:00 he so he excercised for 45 minutes and now blood sugar is down to 165, pt states he feels better now just wanted to let you know this.

## 2012-06-26 NOTE — Telephone Encounter (Signed)
Pt advised and states an understanding 

## 2012-06-28 ENCOUNTER — Telehealth: Payer: Self-pay | Admitting: Cardiovascular Disease

## 2012-06-28 NOTE — Telephone Encounter (Signed)
New problem:   Need re-certification orders for additional  3 more weeks.

## 2012-06-28 NOTE — Telephone Encounter (Signed)
I spoke with Angie and she said the pt cut his UNNA boots off. The pt just has a small amount of edema in his ankles.  Angie would like to continue monitoring the pt for an additional 3 weeks.  This is okay.

## 2012-07-09 ENCOUNTER — Telehealth: Payer: Self-pay

## 2012-07-09 NOTE — Telephone Encounter (Signed)
Ov today.

## 2012-07-09 NOTE — Telephone Encounter (Signed)
April with [facility] [phone]) at pt's house now, pt has a very painful, warm red area on leg that looks like a "blood blister", it measures about  3.5x 2 cm, should pt have leg wrapped in an unna boot and abx, if so please advise

## 2012-07-11 ENCOUNTER — Ambulatory Visit: Payer: Medicare Other | Admitting: Endocrinology

## 2012-07-12 ENCOUNTER — Other Ambulatory Visit: Payer: Self-pay | Admitting: Endocrinology

## 2012-07-12 ENCOUNTER — Ambulatory Visit: Payer: Medicare Other | Admitting: Cardiovascular Disease

## 2012-07-12 NOTE — Telephone Encounter (Signed)
Pt's wife states pt's leg is better and does not need to come in

## 2012-07-12 NOTE — Telephone Encounter (Signed)
April from [facility] called back. I advised her I would leave a mess for the patient to come in for OV.  LM for patient on home # and also on emergency contact VM.

## 2012-07-18 ENCOUNTER — Telehealth: Payer: Self-pay | Admitting: *Deleted

## 2012-07-18 NOTE — Telephone Encounter (Signed)
April , [facility] NURSE, NOTIFIED OF NEED FOR PATIENT TO BE SEEN BY [name] IN OFFICE. April STATED THAT FAMILY DOES AGREE TO OV. APPOINTMENT SCHEDULED FOR 12/14/ @ 2:45pm WITH [name],. STATED PATIENT HAD HAD A FALL YESTERDAY ALSO.

## 2012-07-18 NOTE — Telephone Encounter (Signed)
April , nurse with [facility], called to see if [name] wanted patient to continue to have his leg wrapped. Would be going to see patient today and did not know if [name] had any new orders. Explained that [name] had wanted to see patient. Last week. April stated will tell wife and patient of this again. sue

## 2012-07-18 NOTE — Telephone Encounter (Signed)
Please advise ov 

## 2012-07-19 ENCOUNTER — Ambulatory Visit (INDEPENDENT_AMBULATORY_CARE_PROVIDER_SITE_OTHER): Payer: Medicare Other | Admitting: Endocrinology

## 2012-07-19 ENCOUNTER — Encounter: Payer: Self-pay | Admitting: Endocrinology

## 2012-07-19 VITALS — BP 128/70 | HR 69 | Temp 97.0°F | Wt 228.0 lb

## 2012-07-19 DIAGNOSIS — E119 Type 2 diabetes mellitus without complications: Secondary | ICD-10-CM

## 2012-07-19 DIAGNOSIS — I5022 Chronic systolic (congestive) heart failure: Secondary | ICD-10-CM

## 2012-07-19 DIAGNOSIS — D509 Iron deficiency anemia, unspecified: Secondary | ICD-10-CM

## 2012-07-19 NOTE — Progress Notes (Signed)
Subjective:    Patient ID: Mark French, male    DOB: 05/01/1946, 66 y.o.   MRN: [id number]  HPI Pt states few weeks of worsening of the chronic ulcers of the legs.  No assoc itching cbg's are well-controlled. Past Medical History  Diagnosis Date  . CORONARY ARTERY DISEASE 03/11/2007    s/p CABG 1998;  admitted 08/2011 with an inferior STEMI.  LHC 09/07/11: Severe  Three-vessel CAD, LIMA-LAD patent, SVG-RCA chronically occluded, SVG-circumflex chronically occluded, SVG-diagonal 95%.  PCI: Promus DES to the SVG-diagonal.  Procedure c/b transient CHB req CPR and TTVP;  myoview 4/13:large severe inferolateral infarct, no ischemia, EF 25%    . DIABETES MELLITUS, TYPE I 03/11/2007  . HYPERTENSION 03/11/2007  . 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 HYPERCHOLESTEROLEMIA 09/28/2008  . CERVICAL RADICULOPATHY, LEFT 09/23/2008  . PERIPHERAL NEUROPATHY 09/23/2008  . Chronic systolic heart failure 09/23/2008    Echocardiogram 09/07/11: Difficult study, inferior and posterior lateral wall hypokinesis, mild LVH, EF 45%, mild LAE.   [name] RENAL INSUFFICIENCY 03/15/2010  . ED (erectile dysfunction)   . Hypogonadism male   . GI bleed 2012    Multiple Duodenal ulcer   . Obesity   . Legally blind     bilaterally  . Stroke 75    age 25 months; residual right sided weakness  . History of blood transfusion 2012  . Charcot foot due to diabetes mellitus     chronic pain  . Osteoarthritis     right hip and shoulder  . COPD (chronic obstructive pulmonary disease)   . Myocardial infarction 2013b Jan  . PERIPHERAL VASCULAR INSUFFICIENCY,  LEGS, BILATERAL 08/17/2010    ABI's performed 04/19/11 R 0.72 L 0.71  . ANEMIA-IRON DEFICIENCY 09/23/2008    ulcer related last year    Past Surgical History  Procedure Date  . Carpal tunnel release     left  . Sigmoidoscopy 03/11/2001  . Venous doppler 01/30/2004  . Tonsillectomy and adenoidectomy     "as a kid"  . Cataract extraction w/ intraocular lens   implant, bilateral   . Coronary angioplasty with stent placement 08/2011    "1"  . Retinopathy surgery 2000's    "laser; both eyes"  . Wrist fusion 1976    right  . Anterior fusion cervical spine 2010    "C spine; Dr. Yetta Barre"  . Ankle fracture surgery 1976; ?date    LEFT:  fused; removed bulk of hardware  . Heel spur surgery ~ 2007    ? left  . Irrigation and debridement knee 02/27/2012    Procedure: IRRIGATION AND DEBRIDEMENT KNEE;  Surgeon: Verlee Rossetti, MD;  Location: [facility] OR;  Service: Orthopedics;  Laterality: Right;  irrigation and drainage right knee septic bursitis  . Eye surgery 2008    cataract removal, cautery  . Coronary artery bypass graft 1998    CABG X5  . Fracture surgery     foot broken 1976    History   Social History  . Marital Status: Married    Spouse Name: N/A    Number of Children: 0  . Years of Education: N/A   Occupational History  . Disabled    Social History Main Topics  . Smoking status: Never Smoker   . Smokeless tobacco: Never Used  . Alcohol Use: No  . Drug Use: No  . Sexually Active: No   Other Topics Concern  . Not on file   Social History Narrative   Comes to appointments with wife0  Caffeine drinks daily     Current Outpatient Prescriptions on File Prior to Visit  Medication Sig Dispense Refill  . albuterol (PROVENTIL HFA;VENTOLIN HFA) 108 (90 BASE) MCG/ACT inhaler Inhale 2 puffs into the lungs every 6 (six) hours as needed. For shortness of breath      . aspirin EC 325 MG tablet Take 81 mg by mouth daily.      [name] atorvastatin (LIPITOR) 40 MG tablet Take 1 tablet (40 mg total) by mouth daily.  30 tablet  11  . carvedilol (COREG) 6.25 MG tablet TAKE 1 TABLET BY MOUTH TWICE DAILY WITH A MEAL  60 tablet  4  . clopidogrel (PLAVIX) 75 MG tablet Take 1 tablet (75 mg total) by mouth daily with breakfast.  90 tablet  3  . Docusate Sodium (STOOL SOFTENER) 100 MG capsule Take 100 mg by mouth daily as needed. constipation      . Ferrous  Sulfate 90 (18 FE) MG TABS Take 18 mg of iron by mouth daily.      . Fluticasone-Salmeterol (ADVAIR DISKUS) 100-50 MCG/DOSE AEPB Inhale 1 puff into the lungs 2 (two) times daily.  1 each  11  . gabapentin (NEURONTIN) 600 MG tablet Take 1 tablet (600 mg total) by mouth 3 (three) times daily.  90 tablet  11  . HYDROcodone-acetaminophen (NORCO) 10-325 MG per tablet TAKE 1 TABLET BY MOUTH EVERY 4 HOURS AS NEEDED FOR PAIN  120 tablet  0  . insulin NPH (HUMULIN N,NOVOLIN N) 100 UNIT/ML injection Inject 65 Units into the skin every morning.  2 vial  11  . isosorbide mononitrate (IMDUR) 30 MG 24 hr tablet Take 30 mg by mouth daily.      [name] losartan (COZAAR) 50 MG tablet Take 50 mg by mouth daily.      . pantoprazole (PROTONIX) 40 MG tablet TAKE 1 TABLET BY MOUTH DAILY  30 tablet  10  . potassium chloride SA (K-DUR,KLOR-CON) 20 MEQ tablet Take 1 tablet (20 mEq total) by mouth daily.  30 tablet  6  . spironolactone (ALDACTONE) 25 MG tablet Take 12.5 mg by mouth daily.      . Tamsulosin HCl (FLOMAX) 0.4 MG CAPS Take 0.4 mg by mouth daily.      [name] tiotropium (SPIRIVA) 18 MCG inhalation capsule Place 1 capsule (18 mcg total) into inhaler and inhale daily.  30 capsule  6  . torsemide (DEMADEX) 20 MG tablet 2 tablets (40 mg), twice a day.        Allergies  Allergen Reactions  . Sildenafil Nausea Only and Other (See Comments)    "took it once; heart went to pieces; never took it again"  . Contrast Media (Iodinated Diagnostic Agents) Nausea Only    Family History  Problem Relation Age of Onset  . Heart disease Mother     Coronary Artery Disease  . Heart disease Father     Coronary Artery Disease  . Heart disease Paternal Grandmother     Coronary Artery Disease  . Colon cancer Neg Hx   . Diabetes Other     Grandmother    BP 128/70  Pulse 69  Temp 97 F (36.1 C) (Oral)  Wt 228 lb (103.42 kg)  SpO2 98%    Review of Systems He denies leg pain, brbpr, and sob.  denies hypoglycemia     Objective:   Physical Exam VITAL SIGNS:  See vs page GENERAL: no distress.  In wheelchair Legs: 2+ bilat leg edema.   There  is (chronic) hyperpigmentation of the legs.  There are multiple abrasions of the anterior tibial areas.  There is a 5 cm diameter skin avulsion at the right anterior tibial area (from a fall yesterday).  No drainage from any of these.  There is a smaller abrasion on the left anterior tibial area.       Assessment & Plan:  Edema, not getting better Renal insuff.  We'll have to watch creat on increased demadex Leg ulcers, exac of chronic.  pt declines ref to wound-care. fe-deficiency anemia, on rx

## 2012-07-19 NOTE — Patient Instructions (Addendum)
Please increase the torsemide to 2 pills, twice a day Please redo your blood test in 1-2 weeks, at the [place] office. Please call if you want to home-health nurse to help you with your legs. Please continue the same insulin and iron pills for now.  Please come back for a follow-up appointment in 3 months.

## 2012-07-23 ENCOUNTER — Telehealth: Payer: Self-pay

## 2012-07-23 NOTE — Telephone Encounter (Signed)
Pt says he wants to take care of his legs for himself for now

## 2012-07-23 NOTE — Telephone Encounter (Signed)
April with [facility] called [phone], would like to know new orders from Friday's office visit and also if pt needs additional visits for his wound care, please advise.

## 2012-08-02 ENCOUNTER — Other Ambulatory Visit (INDEPENDENT_AMBULATORY_CARE_PROVIDER_SITE_OTHER): Payer: Medicare Other

## 2012-08-02 ENCOUNTER — Other Ambulatory Visit: Payer: Self-pay | Admitting: Endocrinology

## 2012-08-02 DIAGNOSIS — I5022 Chronic systolic (congestive) heart failure: Secondary | ICD-10-CM

## 2012-08-02 DIAGNOSIS — D509 Iron deficiency anemia, unspecified: Secondary | ICD-10-CM

## 2012-08-02 DIAGNOSIS — E119 Type 2 diabetes mellitus without complications: Secondary | ICD-10-CM

## 2012-08-02 LAB — CBC WITH DIFFERENTIAL/PLATELET
Basophils Absolute: 0 10*3/uL (ref 0.0–0.1)
Eosinophils Absolute: 0.6 10*3/uL (ref 0.0–0.7)
HCT: 35.3 % — ABNORMAL LOW (ref 39.0–52.0)
Lymphs Abs: 1.2 10*3/uL (ref 0.7–4.0)
MCV: 80.6 fl (ref 78.0–100.0)
Monocytes Absolute: 1.1 10*3/uL — ABNORMAL HIGH (ref 0.1–1.0)
Neutrophils Relative %: 57.5 % (ref 43.0–77.0)
Platelets: 204 10*3/uL (ref 150.0–400.0)
RDW: 16.3 % — ABNORMAL HIGH (ref 11.5–14.6)

## 2012-08-02 LAB — BASIC METABOLIC PANEL
BUN: 39 mg/dL — ABNORMAL HIGH (ref 6–23)
CO2: 29 mEq/L (ref 19–32)
Calcium: 8.8 mg/dL (ref 8.4–10.5)
Chloride: 100 mEq/L (ref 96–112)
Creatinine, Ser: 1.6 mg/dL — ABNORMAL HIGH (ref 0.4–1.5)

## 2012-08-02 LAB — IBC PANEL
Iron: 32 ug/dL — ABNORMAL LOW (ref 42–165)
Transferrin: 258 mg/dL (ref 212.0–360.0)

## 2012-08-08 ENCOUNTER — Telehealth: Payer: Self-pay | Admitting: Endocrinology

## 2012-08-08 NOTE — Telephone Encounter (Signed)
Medication called in for gabapentin 600mg  tid #90 with 3 refills [facility]

## 2012-08-08 NOTE — Telephone Encounter (Signed)
The patient's wife called to speak with a nurse regarding her husband's new gabapentin rx.  She stated she is confused about this medication. Please call the patient's wife at [phone].

## 2012-08-13 ENCOUNTER — Other Ambulatory Visit: Payer: Self-pay | Admitting: Physician Assistant

## 2012-08-13 ENCOUNTER — Ambulatory Visit: Payer: Medicare Other | Admitting: Endocrinology

## 2012-08-14 ENCOUNTER — Other Ambulatory Visit: Payer: Self-pay | Admitting: *Deleted

## 2012-08-14 MED ORDER — ISOSORBIDE MONONITRATE ER 30 MG PO TB24: 30.0000 mg | ORAL_TABLET | Freq: Every day | ORAL | Status: DC

## 2012-08-14 NOTE — Telephone Encounter (Signed)
Refilled Isosorbide. 

## 2012-08-22 ENCOUNTER — Telehealth: Payer: Self-pay | Admitting: *Deleted

## 2012-08-22 ENCOUNTER — Telehealth: Payer: Self-pay

## 2012-08-22 ENCOUNTER — Other Ambulatory Visit: Payer: Self-pay | Admitting: Endocrinology

## 2012-08-22 MED ORDER — HYDROCODONE-ACETAMINOPHEN 10-325 MG PO TABS: 1.0000 | ORAL_TABLET | ORAL | Status: DC | PRN

## 2012-08-22 NOTE — Telephone Encounter (Signed)
i printed 

## 2012-08-22 NOTE — Telephone Encounter (Signed)
Pt's wife calling for refill of his hydrocodone.  She called it in to the pharmacy on Monday, but has not heard anything yet, so just checking on this.

## 2012-08-22 NOTE — Telephone Encounter (Signed)
Patient notified of Rx faxed in to [facility] for hydrocodone refill per [name].

## 2012-09-09 ENCOUNTER — Telehealth: Payer: Self-pay | Admitting: *Deleted

## 2012-09-09 NOTE — Telephone Encounter (Signed)
Agree with plan. Would not add additional K-Dur. Recommend check BMET and BNP at time of upcoming office visit.  Mark French 09/09/2012 5:47 PM

## 2012-09-09 NOTE — Telephone Encounter (Signed)
Received message on triage voice mail from Lauren RN case manager for this patient. She has reported that this patient has gained 10 pounds in 14 days and has some lower extremity edema. He has an appointment with Dr.Michael Excell Seltzer on 2/13. Will forward message to Dr.Cooper for advice.

## 2012-09-09 NOTE — Telephone Encounter (Signed)
I spoke with the pt's wife and on the pt's scales at home he weighs 235.  I instructed the pt and his wife to increase Torsemide to 40mg  twice a day until his weight is back to baseline at 230. Once the pt reaches baseline weight he will go back to Torsemide 40mg  every AM and 20mg  every PM.  The pt is scheduled to see Dr Excell Seltzer on 09/19/12. The pt has also been elevating his legs today and his swelling has improved. I will speak with Dr Excell Seltzer to determine if the pt should increase his potassium with higher dose of Torsemide.

## 2012-09-17 ENCOUNTER — Ambulatory Visit: Payer: Medicare Other | Admitting: Cardiovascular Disease

## 2012-09-17 ENCOUNTER — Other Ambulatory Visit (HOSPITAL_COMMUNITY): Payer: Self-pay | Admitting: Physician Assistant

## 2012-09-19 ENCOUNTER — Ambulatory Visit: Payer: Medicare Other | Admitting: Cardiovascular Disease

## 2012-09-24 ENCOUNTER — Ambulatory Visit
Admission: RE | Admit: 2012-09-24 | Discharge: 2012-09-24 | Disposition: A | Discharge status: Home / Self Care | Payer: Medicare Other | Source: Ambulatory Visit | Attending: Endocrinology | Admitting: Endocrinology

## 2012-09-24 ENCOUNTER — Ambulatory Visit (INDEPENDENT_AMBULATORY_CARE_PROVIDER_SITE_OTHER): Payer: Medicare Other | Admitting: Endocrinology

## 2012-09-24 VITALS — BP 116/58 | HR 77 | Wt 228.0 lb

## 2012-09-24 DIAGNOSIS — M25559 Pain in unspecified hip: Secondary | ICD-10-CM

## 2012-09-24 DIAGNOSIS — M25552 Pain in left hip: Secondary | ICD-10-CM

## 2012-09-24 MED ORDER — OXYCODONE-ACETAMINOPHEN 10-325 MG PO TABS: 1.0000 | ORAL_TABLET | ORAL | Status: DC | PRN

## 2012-09-24 NOTE — Patient Instructions (Addendum)
A blood test and x-rays are being requested for you today.  We'll contact you with results. Here is a prescription, for a stronger pain pill. I hope you feel better soon.  If you don't feel better by next week, please call back.

## 2012-09-24 NOTE — Progress Notes (Signed)
Subjective:    Patient ID: Mark French, male    DOB: 01-Feb-1946, 67 y.o.   MRN: [id number]  HPI Pt states few weeks of severe pain at the left hip, but no assoc rash.  sxs are worsened by applying weight to the LLE.  He is unable to cite precip factor.    Past Medical History  Diagnosis Date  . CORONARY ARTERY DISEASE 03/11/2007    s/p CABG 1998;  admitted 08/2011 with an inferior STEMI.  LHC 09/07/11: Severe  Three-vessel CAD, LIMA-LAD patent, SVG-RCA chronically occluded, SVG-circumflex chronically occluded, SVG-diagonal 95%.  PCI: Promus DES to the SVG-diagonal.  Procedure c/b transient CHB req CPR and TTVP;  myoview 4/13:large severe inferolateral infarct, no ischemia, EF 25%    . DIABETES MELLITUS, TYPE I 03/11/2007  . HYPERTENSION 03/11/2007  . 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 HYPERCHOLESTEROLEMIA 09/28/2008  . CERVICAL RADICULOPATHY, LEFT 09/23/2008  . PERIPHERAL NEUROPATHY 09/23/2008  . Chronic systolic heart failure 09/23/2008    Echocardiogram 09/07/11: Difficult study, inferior and posterior lateral wall hypokinesis, mild LVH, EF 45%, mild LAE.   [name] RENAL INSUFFICIENCY 03/15/2010  . ED (erectile dysfunction)   . Hypogonadism male   . GI bleed 2012    Multiple Duodenal ulcer   . Obesity   . Legally blind     bilaterally  . Stroke 10    age 41 months; residual right sided weakness  . History of blood transfusion 2012  . Charcot foot due to diabetes mellitus     chronic pain  . Osteoarthritis     right hip and shoulder  . COPD (chronic obstructive pulmonary disease)   . Myocardial infarction 2013b Jan  . PERIPHERAL VASCULAR INSUFFICIENCY,  LEGS, BILATERAL 08/17/2010    ABI's performed 04/19/11 R 0.72 L 0.71  . ANEMIA-IRON DEFICIENCY 09/23/2008    ulcer related last year    Past Surgical History  Procedure Laterality Date  . Carpal tunnel release      left  . Sigmoidoscopy  03/11/2001  . Venous doppler  01/30/2004  . Tonsillectomy and adenoidectomy     "as a kid"  . Cataract extraction w/ intraocular lens  implant, bilateral    . Coronary angioplasty with stent placement  08/2011    "1"  . Retinopathy surgery  2000's    "laser; both eyes"  . Wrist fusion  1976    right  . Anterior fusion cervical spine  2010    "C spine; Dr. Yetta Barre"  . Ankle fracture surgery  1976; ?date    LEFT:  fused; removed bulk of hardware  . Heel spur surgery  ~ 2007    ? left  . Irrigation and debridement knee  02/27/2012    Procedure: IRRIGATION AND DEBRIDEMENT KNEE;  Surgeon: Verlee Rossetti, MD;  Location: [facility] OR;  Service: Orthopedics;  Laterality: Right;  irrigation and drainage right knee septic bursitis  . Eye surgery  2008    cataract removal, cautery  . Coronary artery bypass graft  1998    CABG X5  . Fracture surgery      foot broken 1976    History   Social History  . Marital Status: Married    Spouse Name: N/A    Number of Children: 0  . Years of Education: N/A   Occupational History  . Disabled    Social History Main Topics  . Smoking status: Never Smoker   . Smokeless tobacco: Never Used  . Alcohol Use: No  .  Drug Use: No  . Sexually Active: No   Other Topics Concern  . Not on file   Social History Narrative   Comes to appointments with wife      0 Caffeine drinks daily     Current Outpatient Prescriptions on File Prior to Visit  Medication Sig Dispense Refill  . albuterol (PROVENTIL HFA;VENTOLIN HFA) 108 (90 BASE) MCG/ACT inhaler Inhale 2 puffs into the lungs every 6 (six) hours as needed. For shortness of breath      . aspirin EC 325 MG tablet Take 81 mg by mouth daily.      [name] atorvastatin (LIPITOR) 40 MG tablet Take 1 tablet (40 mg total) by mouth daily.  30 tablet  11  . carvedilol (COREG) 6.25 MG tablet TAKE 1 TABLET BY MOUTH TWICE DAILY WITH A MEAL  60 tablet  4  . clopidogrel (PLAVIX) 75 MG tablet Take 1 tablet (75 mg total) by mouth daily with breakfast.  90 tablet  3  . Docusate Sodium (STOOL SOFTENER) 100 MG  capsule Take 100 mg by mouth daily as needed. constipation      . Ferrous Sulfate 90 (18 FE) MG TABS Take 18 mg of iron by mouth daily.      . Fluticasone-Salmeterol (ADVAIR DISKUS) 100-50 MCG/DOSE AEPB Inhale 1 puff into the lungs 2 (two) times daily.  1 each  11  . gabapentin (NEURONTIN) 300 MG capsule TAKE ONE CAPSULE BY MOUTH TWICE DAILY  60 capsule  2  . gabapentin (NEURONTIN) 600 MG tablet Take 1 tablet (600 mg total) by mouth 3 (three) times daily.  90 tablet  11  . insulin NPH (HUMULIN N,NOVOLIN N) 100 UNIT/ML injection Inject 65 Units into the skin every morning.  2 vial  11  . isosorbide mononitrate (IMDUR) 30 MG 24 hr tablet Take 1 tablet (30 mg total) by mouth daily.  30 tablet  3  . losartan (COZAAR) 50 MG tablet Take 50 mg by mouth daily.      . pantoprazole (PROTONIX) 40 MG tablet TAKE 1 TABLET BY MOUTH DAILY  30 tablet  10  . potassium chloride SA (K-DUR,KLOR-CON) 20 MEQ tablet Take 1 tablet (20 mEq total) by mouth daily.  30 tablet  6  . spironolactone (ALDACTONE) 25 MG tablet Take 12.5 mg by mouth daily.      [name] spironolactone (ALDACTONE) 25 MG tablet TAKE 1/2 TABLET BY MOUTH EVERY DAY  30 tablet  1  . tiotropium (SPIRIVA) 18 MCG inhalation capsule Place 1 capsule (18 mcg total) into inhaler and inhale daily.  30 capsule  6  . torsemide (DEMADEX) 20 MG tablet 2 tablets (40 mg), twice a day.      . Tamsulosin HCl (FLOMAX) 0.4 MG CAPS Take 0.4 mg by mouth daily.       No current facility-administered medications on file prior to visit.    Allergies  Allergen Reactions  . Sildenafil Nausea Only and Other (See Comments)    "took it once; heart went to pieces; never took it again"  . Contrast Media (Iodinated Diagnostic Agents) Nausea Only    Family History  Problem Relation Age of Onset  . Heart disease Mother     Coronary Artery Disease  . Heart disease Father     Coronary Artery Disease  . Heart disease Paternal Grandmother     Coronary Artery Disease  . Colon cancer  Neg Hx   . Diabetes Other     Grandmother    BP  116/58  Pulse 77  Wt 228 lb (103.42 kg)  BMI 36.82 kg/m2  SpO2 98%  Review of Systems Denies numbness and fever    Objective:   Physical Exam VITAL SIGNS:  See vs page GENERAL: no distress.   Left hip: nontender.   Gait: unable to apply weight to the LLE.      Assessment & Plan:  Hip pain, new, uncertain etiology

## 2012-10-02 ENCOUNTER — Other Ambulatory Visit: Payer: Self-pay | Admitting: Neurology

## 2012-10-02 MED ORDER — CARVEDILOL 6.25 MG PO TABS: ORAL_TABLET | ORAL | Status: DC

## 2012-10-04 ENCOUNTER — Telehealth: Payer: Self-pay | Admitting: *Deleted

## 2012-10-04 ENCOUNTER — Ambulatory Visit (INDEPENDENT_AMBULATORY_CARE_PROVIDER_SITE_OTHER): Payer: Medicare Other | Admitting: Internal Medicine

## 2012-10-04 ENCOUNTER — Telehealth: Payer: Self-pay | Admitting: Endocrinology

## 2012-10-04 ENCOUNTER — Encounter: Payer: Self-pay | Admitting: Internal Medicine

## 2012-10-04 VITALS — BP 112/56 | HR 57 | Temp 98.0°F | Ht 66.0 in | Wt 226.0 lb

## 2012-10-04 DIAGNOSIS — L8991 Pressure ulcer of unspecified site, stage 1: Secondary | ICD-10-CM

## 2012-10-04 DIAGNOSIS — L89321 Pressure ulcer of left buttock, stage 1: Secondary | ICD-10-CM

## 2012-10-04 DIAGNOSIS — L89309 Pressure ulcer of unspecified buttock, unspecified stage: Secondary | ICD-10-CM

## 2012-10-04 NOTE — Telephone Encounter (Signed)
Patient Information:  Caller Name: Jola Babinski  Phone: [phone]  Patient: Mark French, Mark French  Gender: Male  DOB: 01-Feb-1946  Age: 67 Years  PCP: [name] (Adults only)  Office Follow Up:  Does the office need to follow up with this patient?: No  Instructions For The Office: N/A  RN Note:  Patient spends a significant amount of time sitting and has break down from prolonged sitting. Area of breakdown is dime sized with oozing noted and surrounded by smaller areas that are healing.  Wife is requesting an appointment to have patient evaluated.  Symptoms  Reason For Call & Symptoms: Small areas of skin breakdown to upper back thigh from sitting  Reviewed Health History In EMR: Yes  Reviewed Medications In EMR: Yes  Reviewed Allergies In EMR: Yes  Reviewed Surgeries / Procedures: Yes  Date of Onset of Symptoms: 09/20/2012  Treatments Tried: using maxipads in underwear for wound support and drainage.  Treatments Tried Worked: No  Guideline(s) Used:  Wound Infection  Disposition Per Guideline:   See Today in Office  Reason For Disposition Reached:   Patient wants to be seen  Advice Given:  N/A  Appointment Scheduled:  10/04/2012 11:00:00 Appointment Scheduled Provider:  Nicki Reaper

## 2012-10-04 NOTE — Patient Instructions (Signed)
Pressure Ulcer  A pressure ulcer is a sore where the skin breaks down and exposes deeper layers of tissue. It develops in areas of the body where there is unrelieved pressure. Pressure ulcers are usually found over a boney area, such as the shoulder blades, spine, lower back, hips, knees, ankles, and heels.  CAUSES    Decreased ability to move.   Decreased ability to feel pain or discomfort.   Moisture from urine or stool.   Poor nutrition.   Pulling sheets that are under a patient when changing his or her position.  STAGING PRESSURE ULCERS  Your caregiver may determine the degree of severity (stage) of your pressure ulcer. The stages include:   Stage 1: The skin is red, and when the skin is pressed, it stays red.   Stage 2: The top layer of skin is gone, and there is a shallow, pink ulcer.   Stage 3: The ulcer becomes deeper, and it is more difficult to see the whole wound. Also, there may be yellow or brown parts, as well as pink and red parts.   Stage 4: The ulcer may be deep and red, pink, brown, white, or yellow. Bone or muscle may be seen.   Unstageable pressure ulcer: The ulcer is covered almost completely with black, brown, or yellow tissue. It is not known how deep the ulcer is or what stage it is until this covering comes off.   Suspected deep tissue injury: A patient's skin can be injured from pressure or pulling on the skin when his or her position is changed. The skin appears purple or maroon. There may not be an opening in the skin, but there could be a blood-filled blister. This deep tissue injury is often difficult to see in people with darker skin tones. The skin may go back to normal when pressure is relieved, or the site may open up and become deeper in time.  DIAGNOSIS   Your caregiver will diagnose your pressure ulcer based on its appearance. Your caregiver may determine the stage of your pressure ulcer as well. Your caregiver may request tests to check for infection, assess your  circulation, or to check for other diseases, such as diabetes.  TREATMENT   Treatment of your pressure ulcer begins with determining what stage the ulcer is in. Your treatment team may include your caregiver, a wound care specialist, a nutritionist, a physical therapist, and a surgeon. Treatments include:    Moving or repositioning every 1 to 2 hours.   Using beds or mattresses to shift your body weight and pressure points frequently.   Improving your diet.   Cleaning and bandaging (dressing) the open wound.   Giving antibiotic medicines.   Removing damaged tissue.   Surgery and sometimes skin grafts.  HOME CARE INSTRUCTIONS   Follow the care plan that was started in the hospital.   Avoid staying in the same position for more than 2 hours. Use padding, devices, or mattresses to cushion your pressure points as directed by your caregiver.   Eat well. Take nutritional supplements and vitamins as directed by your caregiver.   Keep all follow-up appointments.   Take pain medicine as directed by your caregiver.  SEEK MEDICAL CARE IF:    Your pressure ulcer is not improving.   You do not know how to care for your pressure ulcer.   You notice other areas of redness on your skin.  SEEK IMMEDIATE MEDICAL CARE IF:    You have increasing redness,   swelling, or pain in your pressure ulcer.   You notice pus, or increased pus, coming from your pressure ulcer.   You have a fever.   You notice a bad smell coming from the wound or dressing.   Your pressure ulcer opens up again.  MAKE SURE YOU:    Understand these instructions.   Will watch your condition.   Will get help right away if you are not doing well or get worse.  Document Released: 07/24/2005 Document Revised: 10/16/2011 Document Reviewed: 03/10/2011  [employer] Patient Information 2013 [employer].

## 2012-10-04 NOTE — Telephone Encounter (Signed)
Wife called and [facility] nurse called concerned of patient area on back of upper leg. Instructed wife to call Oleh Genin to see if can be seen today to assess the area. If can not be seen today to go to Urgent Care. Wife understands this. States the patient sits so much and sleeps so much that is why this area looks this way but will call [employer].

## 2012-10-04 NOTE — Progress Notes (Signed)
Subjective:    Patient ID: Mark French, male    DOB: 17-Oct-1945, 67 y.o.   MRN: [id number]  HPI  Pt presents to the clinic today with c/o a pressure sore on his buttocks. He noticed the soreness on his buttocks about 2 weeks ago. At that time it was the size of a quarter. It is now the size of a softball. It is very tender. He has been putting some cream on it but he does not think it is helping. He did have a bandage with tape on it and when he pulled it off yesterday, her tore some of the skin and caused it to bleed. He does ride around in a motorized wheelchair and does sleep in a recliner, so he does have a lot of pressure on this area. He does try to change positions frequently. He has not had a pressure sore in the past but he does have a history of non healing wounds secondary to his diabetes. He has been seen in the wound clinic before. He would like to go back for evaluation of this pressure sore.  Review of Systems      Past Medical History  Diagnosis Date  . CORONARY ARTERY DISEASE 03/11/2007    s/p CABG 1998;  admitted 08/2011 with an inferior STEMI.  LHC 09/07/11: Severe  Three-vessel CAD, LIMA-LAD patent, SVG-RCA chronically occluded, SVG-circumflex chronically occluded, SVG-diagonal 95%.  PCI: Promus DES to the SVG-diagonal.  Procedure c/b transient CHB req CPR and TTVP;  myoview 4/13:large severe inferolateral infarct, no ischemia, EF 25%    . DIABETES MELLITUS, TYPE I 03/11/2007  . HYPERTENSION 03/11/2007  . 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 HYPERCHOLESTEROLEMIA 09/28/2008  . CERVICAL RADICULOPATHY, LEFT 09/23/2008  . PERIPHERAL NEUROPATHY 09/23/2008  . Chronic systolic heart failure 09/23/2008    Echocardiogram 09/07/11: Difficult study, inferior and posterior lateral wall hypokinesis, mild LVH, EF 45%, mild LAE.   [name] RENAL INSUFFICIENCY 03/15/2010  . ED (erectile dysfunction)   . Hypogonadism male   . GI bleed 2012    Multiple Duodenal ulcer   . Obesity   .  Legally blind     bilaterally  . Stroke 77    age 76 months; residual right sided weakness  . History of blood transfusion 2012  . Charcot foot due to diabetes mellitus     chronic pain  . Osteoarthritis     right hip and shoulder  . COPD (chronic obstructive pulmonary disease)   . Myocardial infarction 2013b Jan  . PERIPHERAL VASCULAR INSUFFICIENCY,  LEGS, BILATERAL 08/17/2010    ABI's performed 04/19/11 R 0.72 L 0.71  . ANEMIA-IRON DEFICIENCY 09/23/2008    ulcer related last year    Current Outpatient Prescriptions  Medication Sig Dispense Refill  . albuterol (PROVENTIL HFA;VENTOLIN HFA) 108 (90 BASE) MCG/ACT inhaler Inhale 2 puffs into the lungs every 6 (six) hours as needed. For shortness of breath      . aspirin EC 325 MG tablet Take 81 mg by mouth daily.      [name] atorvastatin (LIPITOR) 40 MG tablet Take 1 tablet (40 mg total) by mouth daily.  30 tablet  11  . carvedilol (COREG) 6.25 MG tablet TAKE 1 TABLET BY MOUTH TWICE DAILY WITH A MEAL  60 tablet  4  . clopidogrel (PLAVIX) 75 MG tablet Take 1 tablet (75 mg total) by mouth daily with breakfast.  90 tablet  3  . Docusate Sodium (STOOL SOFTENER) 100 MG capsule Take 100 mg by  mouth daily as needed. constipation      . Ferrous Sulfate 90 (18 FE) MG TABS Take 18 mg of iron by mouth daily.      . Fluticasone-Salmeterol (ADVAIR DISKUS) 100-50 MCG/DOSE AEPB Inhale 1 puff into the lungs 2 (two) times daily.  1 each  11  . gabapentin (NEURONTIN) 300 MG capsule TAKE ONE CAPSULE BY MOUTH TWICE DAILY  60 capsule  2  . gabapentin (NEURONTIN) 600 MG tablet Take 1 tablet (600 mg total) by mouth 3 (three) times daily.  90 tablet  11  . insulin NPH (HUMULIN N,NOVOLIN N) 100 UNIT/ML injection Inject 65 Units into the skin every morning.  2 vial  11  . isosorbide mononitrate (IMDUR) 30 MG 24 hr tablet Take 1 tablet (30 mg total) by mouth daily.  30 tablet  3  . losartan (COZAAR) 50 MG tablet Take 50 mg by mouth daily.      [name] oxyCODONE-acetaminophen  (PERCOCET) 10-325 MG per tablet Take 1 tablet by mouth every 4 (four) hours as needed for pain.  100 tablet  0  . pantoprazole (PROTONIX) 40 MG tablet TAKE 1 TABLET BY MOUTH DAILY  30 tablet  10  . potassium chloride SA (K-DUR,KLOR-CON) 20 MEQ tablet Take 1 tablet (20 mEq total) by mouth daily.  30 tablet  6  . silodosin (RAPAFLO) 8 MG CAPS capsule Take 8 mg by mouth daily with breakfast.      . spironolactone (ALDACTONE) 25 MG tablet Take 12.5 mg by mouth daily.      [name] spironolactone (ALDACTONE) 25 MG tablet TAKE 1/2 TABLET BY MOUTH EVERY DAY  30 tablet  1  . Tamsulosin HCl (FLOMAX) 0.4 MG CAPS Take 0.4 mg by mouth daily.      [name] tiotropium (SPIRIVA) 18 MCG inhalation capsule Place 1 capsule (18 mcg total) into inhaler and inhale daily.  30 capsule  6  . torsemide (DEMADEX) 20 MG tablet 2 tablets (40 mg), twice a day.       No current facility-administered medications for this visit.    Allergies  Allergen Reactions  . Sildenafil Nausea Only and Other (See Comments)    "took it once; heart went to pieces; never took it again"  . Contrast Media (Iodinated Diagnostic Agents) Nausea Only    Family History  Problem Relation Age of Onset  . Heart disease Mother     Coronary Artery Disease  . Heart disease Father     Coronary Artery Disease  . Heart disease Paternal Grandmother     Coronary Artery Disease  . Colon cancer Neg Hx   . Diabetes Other     Grandmother    History   Social History  . Marital Status: Married    Spouse Name: N/A    Number of Children: 0  . Years of Education: N/A   Occupational History  . Disabled    Social History Main Topics  . Smoking status: Never Smoker   . Smokeless tobacco: Never Used  . Alcohol Use: No  . Drug Use: No  . Sexually Active: No   Other Topics Concern  . Not on file   Social History Narrative   Comes to appointments with wife      0 Caffeine drinks daily      Constitutional: Denies fever, malaise, fatigue, headache or  abrupt weight changes.  Skin: Area of redness and tenderness on left buttock.    No other specific complaints in a complete review of systems (  except as listed in HPI above).  Objective:   Physical Exam  BP 112/56  Pulse 57  Temp(Src) 98 F (36.7 C) (Oral)  Ht 5\' 6"  (1.676 m)  Wt 226 lb (102.513 kg)  BMI 36.49 kg/m2  SpO2 96% Wt Readings from Last 3 Encounters:  10/04/12 226 lb (102.513 kg)  09/24/12 228 lb (103.42 kg)  07/19/12 228 lb (103.42 kg)    General: Appears his stated age, obese but well developed, well nourished in NAD. Skin: Large area of redness noted on the posterior portion of the left buttock. There are areas of irritation caused by tape. There is also a skin tear noted from tape. Cardiovascular: Normal rate and rhythm. S1,S2 noted.  No murmur, rubs or gallops noted. No JVD or BLE edema. No carotid bruits noted. Pulmonary/Chest: Normal effort and positive vesicular breath sounds. No respiratory distress. No wheezes, rales or ronchi noted.         Assessment & Plan:   Pressure Ulcer, stage 1, left buttock, new onset with additional workup required:  Apply zinc oxide cream to the area BID Avoid putting direct pressure on the affected area Referral to wound clinic for further evaluation and treatment  RTC as needed or if symptoms persist

## 2012-10-08 ENCOUNTER — Other Ambulatory Visit: Payer: Self-pay | Admitting: *Deleted

## 2012-10-08 DIAGNOSIS — I2510 Atherosclerotic heart disease of native coronary artery without angina pectoris: Secondary | ICD-10-CM

## 2012-10-08 DIAGNOSIS — J45909 Unspecified asthma, uncomplicated: Secondary | ICD-10-CM

## 2012-10-08 DIAGNOSIS — J449 Chronic obstructive pulmonary disease, unspecified: Secondary | ICD-10-CM

## 2012-10-08 MED ORDER — TIOTROPIUM BROMIDE MONOHYDRATE 18 MCG IN CAPS: 18.0000 ug | ORAL_CAPSULE | Freq: Every day | RESPIRATORY_TRACT | Status: DC

## 2012-10-15 ENCOUNTER — Emergency Department (HOSPITAL_COMMUNITY): Payer: Medicare Other

## 2012-10-15 ENCOUNTER — Encounter (HOSPITAL_COMMUNITY): Payer: Self-pay | Admitting: *Deleted

## 2012-10-15 ENCOUNTER — Observation Stay (HOSPITAL_COMMUNITY)
Admission: EM | Admit: 2012-10-15 | Discharge: 2012-10-16 | Disposition: A | Discharge status: Home / Self Care | Payer: Medicare Other | Attending: Internal Medicine | Admitting: Internal Medicine

## 2012-10-15 DIAGNOSIS — F329 Major depressive disorder, single episode, unspecified: Secondary | ICD-10-CM

## 2012-10-15 DIAGNOSIS — I129 Hypertensive chronic kidney disease with stage 1 through stage 4 chronic kidney disease, or unspecified chronic kidney disease: Secondary | ICD-10-CM | POA: Insufficient documentation

## 2012-10-15 DIAGNOSIS — E11319 Type 2 diabetes mellitus with unspecified diabetic retinopathy without macular edema: Secondary | ICD-10-CM | POA: Insufficient documentation

## 2012-10-15 DIAGNOSIS — R0789 Other chest pain: Principal | ICD-10-CM | POA: Insufficient documentation

## 2012-10-15 DIAGNOSIS — R079 Chest pain, unspecified: Secondary | ICD-10-CM | POA: Diagnosis present

## 2012-10-15 DIAGNOSIS — E1142 Type 2 diabetes mellitus with diabetic polyneuropathy: Secondary | ICD-10-CM | POA: Insufficient documentation

## 2012-10-15 DIAGNOSIS — I200 Unstable angina: Secondary | ICD-10-CM

## 2012-10-15 DIAGNOSIS — E1149 Type 2 diabetes mellitus with other diabetic neurological complication: Secondary | ICD-10-CM | POA: Insufficient documentation

## 2012-10-15 DIAGNOSIS — I5022 Chronic systolic (congestive) heart failure: Secondary | ICD-10-CM | POA: Diagnosis present

## 2012-10-15 DIAGNOSIS — M71161 Other infective bursitis, right knee: Secondary | ICD-10-CM

## 2012-10-15 DIAGNOSIS — I509 Heart failure, unspecified: Secondary | ICD-10-CM | POA: Insufficient documentation

## 2012-10-15 DIAGNOSIS — N400 Enlarged prostate without lower urinary tract symptoms: Secondary | ICD-10-CM

## 2012-10-15 DIAGNOSIS — E291 Testicular hypofunction: Secondary | ICD-10-CM

## 2012-10-15 DIAGNOSIS — E11359 Type 2 diabetes mellitus with proliferative diabetic retinopathy without macular edema: Secondary | ICD-10-CM

## 2012-10-15 DIAGNOSIS — I10 Essential (primary) hypertension: Secondary | ICD-10-CM | POA: Diagnosis present

## 2012-10-15 DIAGNOSIS — R5381 Other malaise: Secondary | ICD-10-CM

## 2012-10-15 DIAGNOSIS — E1049 Type 1 diabetes mellitus with other diabetic neurological complication: Secondary | ICD-10-CM

## 2012-10-15 DIAGNOSIS — Z951 Presence of aortocoronary bypass graft: Secondary | ICD-10-CM | POA: Insufficient documentation

## 2012-10-15 DIAGNOSIS — E860 Dehydration: Secondary | ICD-10-CM | POA: Insufficient documentation

## 2012-10-15 DIAGNOSIS — IMO0001 Reserved for inherently not codable concepts without codable children: Secondary | ICD-10-CM

## 2012-10-15 DIAGNOSIS — I25810 Atherosclerosis of coronary artery bypass graft(s) without angina pectoris: Secondary | ICD-10-CM

## 2012-10-15 DIAGNOSIS — N183 Chronic kidney disease, stage 3 unspecified: Secondary | ICD-10-CM | POA: Diagnosis present

## 2012-10-15 DIAGNOSIS — I2510 Atherosclerotic heart disease of native coronary artery without angina pectoris: Secondary | ICD-10-CM | POA: Insufficient documentation

## 2012-10-15 DIAGNOSIS — E1139 Type 2 diabetes mellitus with other diabetic ophthalmic complication: Secondary | ICD-10-CM | POA: Insufficient documentation

## 2012-10-15 DIAGNOSIS — I872 Venous insufficiency (chronic) (peripheral): Secondary | ICD-10-CM | POA: Insufficient documentation

## 2012-10-15 DIAGNOSIS — R609 Edema, unspecified: Secondary | ICD-10-CM | POA: Insufficient documentation

## 2012-10-15 DIAGNOSIS — G609 Hereditary and idiopathic neuropathy, unspecified: Secondary | ICD-10-CM

## 2012-10-15 DIAGNOSIS — L0390 Cellulitis, unspecified: Secondary | ICD-10-CM

## 2012-10-15 DIAGNOSIS — N259 Disorder resulting from impaired renal tubular function, unspecified: Secondary | ICD-10-CM

## 2012-10-15 DIAGNOSIS — J449 Chronic obstructive pulmonary disease, unspecified: Secondary | ICD-10-CM | POA: Insufficient documentation

## 2012-10-15 DIAGNOSIS — Z125 Encounter for screening for malignant neoplasm of prostate: Secondary | ICD-10-CM

## 2012-10-15 DIAGNOSIS — L97909 Non-pressure chronic ulcer of unspecified part of unspecified lower leg with unspecified severity: Secondary | ICD-10-CM

## 2012-10-15 DIAGNOSIS — Z8679 Personal history of other diseases of the circulatory system: Secondary | ICD-10-CM

## 2012-10-15 DIAGNOSIS — E119 Type 2 diabetes mellitus without complications: Secondary | ICD-10-CM

## 2012-10-15 DIAGNOSIS — M25552 Pain in left hip: Secondary | ICD-10-CM

## 2012-10-15 DIAGNOSIS — D509 Iron deficiency anemia, unspecified: Secondary | ICD-10-CM

## 2012-10-15 DIAGNOSIS — J4489 Other specified chronic obstructive pulmonary disease: Secondary | ICD-10-CM | POA: Insufficient documentation

## 2012-10-15 DIAGNOSIS — M5412 Radiculopathy, cervical region: Secondary | ICD-10-CM

## 2012-10-15 DIAGNOSIS — I252 Old myocardial infarction: Secondary | ICD-10-CM

## 2012-10-15 DIAGNOSIS — I5023 Acute on chronic systolic (congestive) heart failure: Secondary | ICD-10-CM

## 2012-10-15 DIAGNOSIS — H548 Legal blindness, as defined in USA: Secondary | ICD-10-CM

## 2012-10-15 DIAGNOSIS — M79609 Pain in unspecified limb: Secondary | ICD-10-CM

## 2012-10-15 DIAGNOSIS — E7800 Pure hypercholesterolemia, unspecified: Secondary | ICD-10-CM

## 2012-10-15 DIAGNOSIS — I739 Peripheral vascular disease, unspecified: Secondary | ICD-10-CM

## 2012-10-15 DIAGNOSIS — J309 Allergic rhinitis, unspecified: Secondary | ICD-10-CM

## 2012-10-15 LAB — CBC WITH DIFFERENTIAL/PLATELET
Eosinophils Absolute: 0.2 10*3/uL (ref 0.0–0.7)
Lymphocytes Relative: 16 % (ref 12–46)
Lymphs Abs: 1.1 10*3/uL (ref 0.7–4.0)
MCH: 27.2 pg (ref 26.0–34.0)
Neutrophils Relative %: 78 % — ABNORMAL HIGH (ref 43–77)
Platelets: 198 10*3/uL (ref 150–400)
RBC: 4.89 MIL/uL (ref 4.22–5.81)
WBC: 7 10*3/uL (ref 4.0–10.5)

## 2012-10-15 LAB — COMPREHENSIVE METABOLIC PANEL
ALT: 14 U/L (ref 0–53)
AST: 17 U/L (ref 0–37)
Alkaline Phosphatase: 75 U/L (ref 39–117)
GFR calc Af Amer: 54 mL/min — ABNORMAL LOW (ref >90)
Glucose, Bld: 121 mg/dL — ABNORMAL HIGH (ref 70–99)
Potassium: 4.4 mEq/L (ref 3.5–5.1)
Sodium: 130 mEq/L — ABNORMAL LOW (ref 135–145)
Total Protein: 6.6 g/dL (ref 6.0–8.3)

## 2012-10-15 LAB — CBC
MCH: 27.7 pg (ref 26.0–34.0)
MCV: 84.4 fL (ref 78.0–100.0)
Platelets: 204 10*3/uL (ref 150–400)
RDW: 15.9 % — ABNORMAL HIGH (ref 11.5–15.5)

## 2012-10-15 MED ORDER — HYDROCODONE-ACETAMINOPHEN 5-325 MG PO TABS
1.0000 | ORAL_TABLET | ORAL | Status: DC | PRN
  Administered 2012-10-15 – 2012-10-16 (×4): 2 via ORAL
  Filled 2012-10-15 (×4): qty 2

## 2012-10-15 MED ORDER — TIOTROPIUM BROMIDE MONOHYDRATE 18 MCG IN CAPS
18.0000 ug | ORAL_CAPSULE | Freq: Every day | RESPIRATORY_TRACT | Status: DC
  Filled 2012-10-15 (×2): qty 5

## 2012-10-15 MED ORDER — TAMSULOSIN HCL 0.4 MG PO CAPS
0.4000 mg | ORAL_CAPSULE | Freq: Every day | ORAL | Status: DC
  Filled 2012-10-15: qty 1

## 2012-10-15 MED ORDER — DIPHENHYDRAMINE HCL 50 MG/ML IJ SOLN
25.0000 mg | Freq: Once | INTRAMUSCULAR | Status: AC
  Administered 2012-10-15: 25 mg via INTRAVENOUS
  Filled 2012-10-15: qty 1

## 2012-10-15 MED ORDER — ACETAMINOPHEN 325 MG PO TABS: 650.0000 mg | ORAL_TABLET | Freq: Four times a day (QID) | ORAL | Status: DC | PRN

## 2012-10-15 MED ORDER — PANTOPRAZOLE SODIUM 40 MG PO TBEC
40.0000 mg | DELAYED_RELEASE_TABLET | Freq: Every day | ORAL | Status: DC
  Administered 2012-10-16: 40 mg via ORAL
  Filled 2012-10-15: qty 1

## 2012-10-15 MED ORDER — INSULIN ASPART 100 UNIT/ML ~~LOC~~ SOLN
3.0000 [IU] | Freq: Three times a day (TID) | SUBCUTANEOUS | Status: DC
  Administered 2012-10-16 (×2): 3 [IU] via SUBCUTANEOUS

## 2012-10-15 MED ORDER — ALBUTEROL SULFATE (5 MG/ML) 0.5% IN NEBU
5.0000 mg | INHALATION_SOLUTION | Freq: Once | RESPIRATORY_TRACT | Status: AC
  Administered 2012-10-15: 5 mg via RESPIRATORY_TRACT
  Filled 2012-10-15: qty 0.5

## 2012-10-15 MED ORDER — ISOSORBIDE MONONITRATE ER 30 MG PO TB24
30.0000 mg | ORAL_TABLET | Freq: Every day | ORAL | Status: DC
  Administered 2012-10-16: 30 mg via ORAL
  Filled 2012-10-15: qty 1

## 2012-10-15 MED ORDER — IPRATROPIUM BROMIDE 0.02 % IN SOLN
0.5000 mg | Freq: Once | RESPIRATORY_TRACT | Status: AC
  Administered 2012-10-15: 0.5 mg via RESPIRATORY_TRACT
  Filled 2012-10-15: qty 2.5

## 2012-10-15 MED ORDER — CARVEDILOL 6.25 MG PO TABS
6.2500 mg | ORAL_TABLET | Freq: Two times a day (BID) | ORAL | Status: DC
  Administered 2012-10-15 – 2012-10-16 (×3): 6.25 mg via ORAL
  Filled 2012-10-15 (×5): qty 1

## 2012-10-15 MED ORDER — ONDANSETRON HCL 4 MG/2ML IJ SOLN: 4.0000 mg | Freq: Four times a day (QID) | INTRAMUSCULAR | Status: DC | PRN

## 2012-10-15 MED ORDER — ACETAMINOPHEN 650 MG RE SUPP: 650.0000 mg | Freq: Four times a day (QID) | RECTAL | Status: DC | PRN

## 2012-10-15 MED ORDER — CLOPIDOGREL BISULFATE 75 MG PO TABS
75.0000 mg | ORAL_TABLET | Freq: Every day | ORAL | Status: DC
  Administered 2012-10-16: 75 mg via ORAL
  Filled 2012-10-15 (×2): qty 1

## 2012-10-15 MED ORDER — ATORVASTATIN CALCIUM 40 MG PO TABS
40.0000 mg | ORAL_TABLET | Freq: Every day | ORAL | Status: DC
  Filled 2012-10-15: qty 1

## 2012-10-15 MED ORDER — LOSARTAN POTASSIUM 50 MG PO TABS
50.0000 mg | ORAL_TABLET | Freq: Every day | ORAL | Status: DC
  Administered 2012-10-16: 50 mg via ORAL
  Filled 2012-10-15: qty 1

## 2012-10-15 MED ORDER — ASPIRIN EC 81 MG PO TBEC
81.0000 mg | DELAYED_RELEASE_TABLET | Freq: Every day | ORAL | Status: DC
  Administered 2012-10-16: 81 mg via ORAL
  Filled 2012-10-15: qty 1

## 2012-10-15 MED ORDER — MOMETASONE FURO-FORMOTEROL FUM 100-5 MCG/ACT IN AERO
2.0000 | INHALATION_SPRAY | Freq: Two times a day (BID) | RESPIRATORY_TRACT | Status: DC
  Administered 2012-10-16: 2 via RESPIRATORY_TRACT
  Filled 2012-10-15: qty 8.8

## 2012-10-15 MED ORDER — ALBUTEROL SULFATE HFA 108 (90 BASE) MCG/ACT IN AERS: 2.0000 | INHALATION_SPRAY | Freq: Four times a day (QID) | RESPIRATORY_TRACT | Status: DC | PRN

## 2012-10-15 MED ORDER — SODIUM CHLORIDE 0.9 % IV SOLN
INTRAVENOUS | Status: DC
  Administered 2012-10-15: 21:00:00 via INTRAVENOUS

## 2012-10-15 MED ORDER — ONDANSETRON HCL 4 MG PO TABS: 4.0000 mg | ORAL_TABLET | Freq: Four times a day (QID) | ORAL | Status: DC | PRN

## 2012-10-15 MED ORDER — INSULIN ASPART 100 UNIT/ML ~~LOC~~ SOLN: 0.0000 [IU] | Freq: Three times a day (TID) | SUBCUTANEOUS | Status: DC

## 2012-10-15 MED ORDER — INSULIN NPH (HUMAN) (ISOPHANE) 100 UNIT/ML ~~LOC~~ SUSP
70.0000 [IU] | Freq: Every day | SUBCUTANEOUS | Status: DC
  Administered 2012-10-16: 70 [IU] via SUBCUTANEOUS
  Filled 2012-10-15: qty 10

## 2012-10-15 MED ORDER — SODIUM CHLORIDE 0.9 % IJ SOLN
3.0000 mL | Freq: Two times a day (BID) | INTRAMUSCULAR | Status: DC
  Administered 2012-10-15 – 2012-10-16 (×2): 3 mL via INTRAVENOUS

## 2012-10-15 MED ORDER — HEPARIN SODIUM (PORCINE) 5000 UNIT/ML IJ SOLN
5000.0000 [IU] | Freq: Three times a day (TID) | INTRAMUSCULAR | Status: DC
  Administered 2012-10-15 – 2012-10-16 (×3): 5000 [IU] via SUBCUTANEOUS
  Filled 2012-10-15 (×5): qty 1

## 2012-10-15 NOTE — ED Provider Notes (Signed)
History     CSN: [id number]  Arrival date & time 10/15/12  1555   First MD Initiated Contact with Patient 10/15/12 1600      Chief Complaint  Patient presents with  . Chest Pain    (Consider location/radiation/quality/duration/timing/severity/associated sxs/prior treatment) HPI Comments: Patient with a history of CAD s/p CABG and coronary stents presents with a chief complaint of chest pain.  He reports that he developed chest pain across his chest around 2 PM today while sitting at his computer.  He took one SL NG and the chest pain resolved.  Chest pain lasted approximately 15 minutes.  He is not having any chest pain at this time.  Pain was located across his chest and did not radiate.  No SOB, dizziness, lightheadedness, syncope, nausea, vomiting, numbness or tingling associated with the pain.  He reports that the last time he had chest pain was when he had an Inferior STEMI in January 2013.  However, he reports that this pain did not feel like the same pain that he had with his STEMI.  His Cardiologist is Dr. Excell Seltzer with [facility].  He also has a history of CHF.  Last EF was 25 %.  Patient does take daily aspirin and did take aspirin earlier today.  The history is provided by the patient.    Past Medical History  Diagnosis Date  . CORONARY ARTERY DISEASE 03/11/2007    s/p CABG 1998;  admitted 08/2011 with an inferior STEMI.  LHC 09/07/11: Severe  Three-vessel CAD, LIMA-LAD patent, SVG-RCA chronically occluded, SVG-circumflex chronically occluded, SVG-diagonal 95%.  PCI: Promus DES to the SVG-diagonal.  Procedure c/b transient CHB req CPR and TTVP;  myoview 4/13:large severe inferolateral infarct, no ischemia, EF 25%    . DIABETES MELLITUS, TYPE I 03/11/2007  . HYPERTENSION 03/11/2007  . 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 HYPERCHOLESTEROLEMIA 09/28/2008  . CERVICAL RADICULOPATHY, LEFT 09/23/2008  . PERIPHERAL NEUROPATHY 09/23/2008  . Chronic systolic heart failure 09/23/2008     Echocardiogram 09/07/11: Difficult study, inferior and posterior lateral wall hypokinesis, mild LVH, EF 45%, mild LAE.   [name] RENAL INSUFFICIENCY 03/15/2010  . ED (erectile dysfunction)   . Hypogonadism male   . GI bleed 2012    Multiple Duodenal ulcer   . Obesity   . Legally blind     bilaterally  . Stroke 42    age 40 months; residual right sided weakness  . History of blood transfusion 2012  . Charcot foot due to diabetes mellitus     chronic pain  . Osteoarthritis     right hip and shoulder  . COPD (chronic obstructive pulmonary disease)   . Myocardial infarction 2013b Jan  . PERIPHERAL VASCULAR INSUFFICIENCY,  LEGS, BILATERAL 08/17/2010    ABI's performed 04/19/11 R 0.72 L 0.71  . ANEMIA-IRON DEFICIENCY 09/23/2008    ulcer related last year    Past Surgical History  Procedure Laterality Date  . Carpal tunnel release      left  . Sigmoidoscopy  03/11/2001  . Venous doppler  01/30/2004  . Tonsillectomy and adenoidectomy      "as a kid"  . Cataract extraction w/ intraocular lens  implant, bilateral    . Coronary angioplasty with stent placement  08/2011    "1"  . Retinopathy surgery  2000's    "laser; both eyes"  . Wrist fusion  1976    right  . Anterior fusion cervical spine  2010    "C spine; Dr. Yetta Barre"  .  Ankle fracture surgery  1976; ?date    LEFT:  fused; removed bulk of hardware  . Heel spur surgery  ~ 2007    ? left  . Irrigation and debridement knee  02/27/2012    Procedure: IRRIGATION AND DEBRIDEMENT KNEE;  Surgeon: Verlee Rossetti, MD;  Location: [facility] OR;  Service: Orthopedics;  Laterality: Right;  irrigation and drainage right knee septic bursitis  . Eye surgery  2008    cataract removal, cautery  . Coronary artery bypass graft  1998    CABG X5  . Fracture surgery      foot broken 1976    Family History  Problem Relation Age of Onset  . Heart disease Mother     Coronary Artery Disease  . Heart disease Father     Coronary Artery Disease  . Heart  disease Paternal Grandmother     Coronary Artery Disease  . Colon cancer Neg Hx   . Diabetes Other     Grandmother    History  Substance Use Topics  . Smoking status: Never Smoker   . Smokeless tobacco: Never Used  . Alcohol Use: No      Review of Systems  Constitutional: Negative for fever and chills.  Respiratory: Negative for shortness of breath.   Cardiovascular: Positive for chest pain.  Gastrointestinal: Negative for nausea and vomiting.  Neurological: Negative for syncope and numbness.  All other systems reviewed and are negative.    Allergies  Sildenafil; Percocet; and Contrast media  Home Medications   Current Outpatient Rx  Name  Route  Sig  Dispense  Refill  . albuterol (PROVENTIL HFA;VENTOLIN HFA) 108 (90 BASE) MCG/ACT inhaler   Inhalation   Inhale 2 puffs into the lungs every 6 (six) hours as needed. For shortness of breath         . aspirin 325 MG EC tablet   Oral   Take 81 mg by mouth daily. Patient's wife cuts into fourths, gives @ 81mg  daily.         [name] atorvastatin (LIPITOR) 40 MG tablet   Oral   Take 1 tablet (40 mg total) by mouth daily.   30 tablet   11   . carvedilol (COREG) 6.25 MG tablet   Oral   Take 6.25 mg by mouth 2 (two) times daily with a meal.         . clopidogrel (PLAVIX) 75 MG tablet   Oral   Take 1 tablet (75 mg total) by mouth daily with breakfast.   90 tablet   3   . clotrimazole (LOTRIMIN) 1 % cream   Topical   Apply 1 application topically daily as needed (for rash around mid section).         [name] co-enzyme Q-10 30 MG capsule   Oral   Take 30 mg by mouth daily.         Tery Sanfilippo Sodium (STOOL SOFTENER) 100 MG capsule   Oral   Take 100 mg by mouth daily as needed. constipation         . Fluticasone-Salmeterol (ADVAIR DISKUS) 100-50 MCG/DOSE AEPB   Inhalation   Inhale 1 puff into the lungs 2 (two) times daily.   1 each   11   . gabapentin (NEURONTIN) 600 MG tablet   Oral   Take 1 tablet (600 mg  total) by mouth 3 (three) times daily.   90 tablet   11   . insulin NPH (HUMULIN N,NOVOLIN N) 100 UNIT/ML injection  Subcutaneous   Inject 70 Units into the skin daily before breakfast.         . isosorbide mononitrate (IMDUR) 30 MG 24 hr tablet   Oral   Take 1 tablet (30 mg total) by mouth daily.   30 tablet   3   . losartan (COZAAR) 50 MG tablet   Oral   Take 50 mg by mouth daily.         . pantoprazole (PROTONIX) 40 MG tablet   Oral   Take 40 mg by mouth daily.         . potassium chloride SA (K-DUR,KLOR-CON) 20 MEQ tablet   Oral   Take 1 tablet (20 mEq total) by mouth daily.   30 tablet   6   . silodosin (RAPAFLO) 8 MG CAPS capsule   Oral   Take 8 mg by mouth daily with breakfast.         . spironolactone (ALDACTONE) 25 MG tablet   Oral   Take 12.5 mg by mouth daily.         [name] tiotropium (SPIRIVA) 18 MCG inhalation capsule   Inhalation   Place 1 capsule (18 mcg total) into inhaler and inhale daily.   30 capsule   6   . torsemide (DEMADEX) 20 MG tablet   Oral   Take 20-40 mg by mouth 2 (two) times daily. Takes 40mg  every morning, and 20mg  every afternoon           BP 116/55  Pulse 72  Temp(Src) 97.7 F (36.5 C) (Oral)  Resp 17  Ht 5\' 6"  (1.676 m)  Wt 229 lb (103.874 kg)  BMI 36.98 kg/m2  SpO2 99%  Physical Exam  Nursing note and vitals reviewed. Constitutional: He appears well-developed and well-nourished. No distress.  HENT:  Head: Normocephalic and atraumatic.  Mouth/Throat: Mucous membranes are dry.  Neck: Normal range of motion. Neck supple.  Cardiovascular: Normal rate, regular rhythm, normal heart sounds and intact distal pulses.   Pulmonary/Chest: Effort normal and breath sounds normal. No respiratory distress. He has no wheezes. He has no rales. He exhibits no tenderness.  Abdominal: Soft. Bowel sounds are normal. There is no tenderness.  Musculoskeletal:  Bilateral pitting edema.  Legs wrapped in ACE bandages from the knee  distally.  Neurological: He is alert.  Skin: Skin is warm and dry. He is not diaphoretic.  Psychiatric: He has a normal mood and affect.    ED Course  Procedures (including critical care time)  Labs Reviewed  CBC WITH DIFFERENTIAL - Abnormal; Notable for the following:    RDW 15.9 (*)    Neutrophils Relative 78 (*)    All other components within normal limits  COMPREHENSIVE METABOLIC PANEL - Abnormal; Notable for the following:    Sodium 130 (*)    Chloride 95 (*)    Glucose, Bld 121 (*)    BUN 29 (*)    Creatinine, Ser 1.47 (*)    Albumin 3.1 (*)    GFR calc non Af Amer 47 (*)    GFR calc Af Amer 54 (*)    All other components within normal limits  TROPONIN I   Dg Chest 2 View  10/15/2012  *RADIOLOGY REPORT*  Clinical Data: Chest pain and cough  CHEST - 2 VIEW  Comparison: 04/26/2012  Findings: Previous median sternotomy and CABG procedure. Mild cardiac enlargement.  No pleural effusion or edema.  No airspace consolidation identified.  Spondylosis noted within the thoracic spine.  IMPRESSION:  1.  No acute cardiopulmonary abnormalities. 2.  Thoracic spondylosis. 3.  Prior CABG.   Original Report Authenticated By: Signa Kell, M.D.      No diagnosis found.   Date: 10/16/2012  Rate: 71  Rhythm: normal sinus rhythm  QRS Axis: left  Intervals: PR prolonged  ST/T Wave abnormalities: nonspecific T wave changes  Conduction Disutrbances:first-degree A-V block  and right bundle branch block  Narrative Interpretation:   Old EKG Reviewed: unchanged    Discussed patient with Dr. Juleen China   Discussed patient with [facility] who has agreed to admit the patient.    MDM  Patient with a history of MI s/p CABG presents today with chest pain.  Pain lasted approximately 15 minutes and then resolved after taking SL NG.  Initial Troponin negative.  No acute changes on EKG.  CXR negative.  However, due to the patient's past history patient will be admitted for CP rule  out.        Pascal Lux Berry, PA-C 10/16/12 1346

## 2012-10-15 NOTE — ED Notes (Signed)
The pt arrived by [facility] from home.  Pt having chest pressure with nausea just pta.  He took a sl nitro vomited and the pain went away.  He has had a chest cold for the past few days.  He had diarrhea also just prior to transport.   No meds given no chest pain now iv per ems

## 2012-10-15 NOTE — H&P (Signed)
[facility] History and Physical  Mark French AVW:[id number] DOB: 1945-08-10 DOA: 10/15/2012  Referring physician:  PCP: [name], MD  Specialists: none  Chief Complaint: Chest pain  HPI: Mark French is a 67 y.o. male  with known CAD s/p prior CABG, with last EF 25% via myoview 4/13, recent MI about 2 years ago ([place]), Hx of HTN, DM, and retinopathy, CKD with Cr 1.2-1.4, anemia, chronic pedal edema secondary to chronic venous insufficiency, COPD ([place]), presents to ER with retrosternal chest pain. He was sitting down when this happened. He remained nothing makes it better or worse. He said he took a nitroglycerin and it did help some. He did not feel short of breath, palpitation or nausea or sweating. Here in the emergency room her first set of cardiac enzymes were negative and his EKG shows sinus tachycardia with right bundle branch, first-degree AV block. So consulted for further evaluation.  Review of Systems: The patient denies anorexia, fever, weight loss,, vision loss, decreased hearing, hoarseness, , syncope, dyspnea on exertion, peripheral edema, balance deficits, hemoptysis, abdominal pain, melena, hematochezia, severe indigestion/heartburn, hematuria, incontinence, genital sores, muscle weakness, suspicious skin lesions, transient blindness, difficulty walking, depression, unusual weight change, abnormal bleeding, enlarged lymph nodes, angioedema, and breast masses.    Past Medical History  Diagnosis Date  . CORONARY ARTERY DISEASE 03/11/2007    s/p CABG 1998;  admitted 08/2011 with an inferior STEMI.  LHC 09/07/11: Severe  Three-vessel CAD, LIMA-LAD patent, SVG-RCA chronically occluded, SVG-circumflex chronically occluded, SVG-diagonal 95%.  PCI: Promus DES to the SVG-diagonal.  Procedure c/b transient CHB req CPR and TTVP;  myoview 4/13:large severe inferolateral infarct, no ischemia, EF 25%    . DIABETES MELLITUS, TYPE I 03/11/2007  . HYPERTENSION 03/11/2007  .  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 HYPERCHOLESTEROLEMIA 09/28/2008  . CERVICAL RADICULOPATHY, LEFT 09/23/2008  . PERIPHERAL NEUROPATHY 09/23/2008  . Chronic systolic heart failure 09/23/2008    Echocardiogram 09/07/11: Difficult study, inferior and posterior lateral wall hypokinesis, mild LVH, EF 45%, mild LAE.   [name] RENAL INSUFFICIENCY 03/15/2010  . ED (erectile dysfunction)   . Hypogonadism male   . GI bleed 2012    Multiple Duodenal ulcer   . Obesity   . Legally blind     bilaterally  . Stroke 68    age 57 months; residual right sided weakness  . History of blood transfusion 2012  . Charcot foot due to diabetes mellitus     chronic pain  . Osteoarthritis     right hip and shoulder  . COPD (chronic obstructive pulmonary disease)   . Myocardial infarction 2013b Jan  . PERIPHERAL VASCULAR INSUFFICIENCY,  LEGS, BILATERAL 08/17/2010    ABI's performed 04/19/11 R 0.72 L 0.71  . ANEMIA-IRON DEFICIENCY 09/23/2008    ulcer related last year   Past Surgical History  Procedure Laterality Date  . Carpal tunnel release      left  . Sigmoidoscopy  03/11/2001  . Venous doppler  01/30/2004  . Tonsillectomy and adenoidectomy      "as a kid"  . Cataract extraction w/ intraocular lens  implant, bilateral    . Coronary angioplasty with stent placement  08/2011    "1"  . Retinopathy surgery  2000's    "laser; both eyes"  . Wrist fusion  1976    right  . Anterior fusion cervical spine  2010    "C spine; Dr. Yetta Barre"  . Ankle fracture surgery  1976; ?date    LEFT:  fused; removed bulk of hardware  . Heel spur surgery  ~ 2007    ? left  . Irrigation and debridement knee  02/27/2012    Procedure: IRRIGATION AND DEBRIDEMENT KNEE;  Surgeon: Verlee Rossetti, MD;  Location: [facility] OR;  Service: Orthopedics;  Laterality: Right;  irrigation and drainage right knee septic bursitis  . Eye surgery  2008    cataract removal, cautery  . Coronary artery bypass graft  1998    CABG X5  . Fracture  surgery      foot broken 1976   Social History:  reports that he has never smoked. He has never used smokeless tobacco. He reports that he does not drink alcohol or use illicit drugs. Visit home with wife can perform most of his ADLs  Allergies  Allergen Reactions  . Sildenafil Nausea Only and Other (See Comments)    "took it once; heart went to pieces; never took it again"  . Percocet (Oxycodone-Acetaminophen) Other (See Comments)    Unknown reaction-possible altered mental state-per wife.    . Contrast Media (Iodinated Diagnostic Agents) Nausea Only    Family History  Problem Relation Age of Onset  . Heart disease Mother     Coronary Artery Disease  . Heart disease Father     Coronary Artery Disease  . Heart disease Paternal Grandmother     Coronary Artery Disease  . Colon cancer Neg Hx   . Diabetes Other     Grandmother    Prior to Admission medications   Medication Sig Start Date End Date Taking? Authorizing Gaile Allmon  albuterol (PROVENTIL HFA;VENTOLIN HFA) 108 (90 BASE) MCG/ACT inhaler Inhale 2 puffs into the lungs every 6 (six) hours as needed. For shortness of breath   Yes Historical Myrtle Barnhard, MD  aspirin 325 MG EC tablet Take 81 mg by mouth daily. Patient's wife cuts into fourths, gives @ 81mg  daily.   Yes Historical Moani Weipert, MD  atorvastatin (LIPITOR) 40 MG tablet Take 1 tablet (40 mg total) by mouth daily. 04/09/12 04/09/13 Yes Tonny Bollman, MD  carvedilol (COREG) 6.25 MG tablet Take 6.25 mg by mouth 2 (two) times daily with a meal.   Yes Historical Doyel Mulkern, MD  clopidogrel (PLAVIX) 75 MG tablet Take 1 tablet (75 mg total) by mouth daily with breakfast. 06/05/12 06/05/13 Yes [name], MD  clotrimazole (LOTRIMIN) 1 % cream Apply 1 application topically daily as needed (for rash around mid section).   Yes Historical Juliano Mceachin, MD  co-enzyme Q-10 30 MG capsule Take 30 mg by mouth daily.   Yes Historical Dennie Moltz, MD  Docusate Sodium (STOOL SOFTENER) 100 MG capsule Take  100 mg by mouth daily as needed. constipation   Yes Historical Mileidy Atkin, MD  Fluticasone-Salmeterol (ADVAIR DISKUS) 100-50 MCG/DOSE AEPB Inhale 1 puff into the lungs 2 (two) times daily. 12/25/11  Yes [name], MD  gabapentin (NEURONTIN) 600 MG tablet Take 1 tablet (600 mg total) by mouth 3 (three) times daily. 06/25/12 06/25/13 Yes [name], MD  insulin NPH (HUMULIN N,NOVOLIN N) 100 UNIT/ML injection Inject 70 Units into the skin daily before breakfast.   Yes Historical Kristina Mcnorton, MD  isosorbide mononitrate (IMDUR) 30 MG 24 hr tablet Take 1 tablet (30 mg total) by mouth daily. 08/14/12 08/14/13 Yes Tonny Bollman, MD  losartan (COZAAR) 50 MG tablet Take 50 mg by mouth daily.   Yes Historical Kelani Robart, MD  pantoprazole (PROTONIX) 40 MG tablet Take 40 mg by mouth daily.   Yes Historical Takoda Janowiak, MD  potassium chloride SA (  K-DUR,KLOR-CON) 20 MEQ tablet Take 1 tablet (20 mEq total) by mouth daily. 05/22/12  Yes Tonny Bollman, MD  silodosin (RAPAFLO) 8 MG CAPS capsule Take 8 mg by mouth daily with breakfast.   Yes Historical Katlen Seyer, MD  spironolactone (ALDACTONE) 25 MG tablet Take 12.5 mg by mouth daily.   Yes Historical Jonella Redditt, MD  tiotropium (SPIRIVA) 18 MCG inhalation capsule Place 1 capsule (18 mcg total) into inhaler and inhale daily. 10/08/12 10/08/13 Yes Kalman Shan, MD  torsemide (DEMADEX) 20 MG tablet Take 20-40 mg by mouth 2 (two) times daily. Takes 40mg  every morning, and 20mg  every afternoon   Yes Historical Domnique Vantine, MD   Physical Exam: Filed Vitals:   10/15/12 1557 10/15/12 1816  BP: 116/55 149/70  Pulse: 72 82  Temp: 97.7 F (36.5 C)   TempSrc: Oral   Resp: 17 16  Height: 5\' 6"  (1.676 m)   Weight: 103.874 kg (229 lb)   SpO2: 99% 99%    BP 149/70  Pulse 82  Temp(Src) 97.7 F (36.5 C) (Oral)  Resp 16  Ht 5\' 6"  (1.676 m)  Wt 103.874 kg (229 lb)  BMI 36.98 kg/m2  SpO2 99%  General Appearance:    Alert, cooperative, no distress, appears stated age, obese   Head:     Normocephalic, without obvious abnormality, atraumatic  Eyes:    PERRL, conjunctiva/corneas clear, EOM's intact, fundi    benign, both eyes             Throat:   dry mucous membrane   Neck:   Supple, symmetrical, trachea midline, no adenopathy;       thyroid:  No enlargement/tenderness/nodules; no carotid   bruit or JVD  Back:     Symmetric, no curvature, ROM normal, no CVA tenderness  Lungs:     Clear to auscultation bilaterally, respirations unlabored  Chest wall:    No tenderness or deformity, nontender to palpation   Heart:    Regular rate and rhythm, S1 and S2 normal, no murmur, rub   or gallop  Abdomen:     Soft, non-tender, bowel sounds active all four quadrants,    no masses, no organomegaly        Extremities:   the wrapped in Ace bandages   Pulses:   2+ and symmetric all extremities  Skin:   Skin color, texture, turgor normal, no rashes or lesions  Lymph nodes:   Cervical, supraclavicular, and axillary nodes normal  Neurologic:   CNII-XII intact. Normal strength, sensation and reflexes      throughout    Labs on Admission:  Basic Metabolic Panel:  Recent Labs Lab 10/15/12 1603  NA 130*  K 4.4  CL 95*  CO2 24  GLUCOSE 121*  BUN 29*  CREATININE 1.47*  CALCIUM 8.6   Liver Function Tests:  Recent Labs Lab 10/15/12 1603  AST 17  ALT 14  ALKPHOS 75  BILITOT 0.3  PROT 6.6  ALBUMIN 3.1*   No results found for this basename: LIPASE, AMYLASE,  in the last 168 hours No results found for this basename: AMMONIA,  in the last 168 hours CBC:  Recent Labs Lab 10/15/12 1603  WBC 7.0  NEUTROABS 5.5  HGB 13.3  HCT 40.2  MCV 82.2  PLT 198   Cardiac Enzymes:  Recent Labs Lab 10/15/12 1600  TROPONINI <0.30    BNP (last 3 results)  Recent Labs  01/30/12 1619 04/26/12 1944 08/02/12 1114  PROBNP 207.0* 788.0* 248.0*   CBG: No results  found for this basename: GLUCAP,  in the last 168 hours  Radiological Exams on Admission: Dg Chest 2  View  10/15/2012  *RADIOLOGY REPORT*  Clinical Data: Chest pain and cough  CHEST - 2 VIEW  Comparison: 04/26/2012  Findings: Previous median sternotomy and CABG procedure. Mild cardiac enlargement.  No pleural effusion or edema.  No airspace consolidation identified.  Spondylosis noted within the thoracic spine.  IMPRESSION:  1.  No acute cardiopulmonary abnormalities. 2.  Thoracic spondylosis. 3.  Prior CABG.   Original Report Authenticated By: Signa Kell, M.D.     EKG: Independently reviewed. Sinus tachycardia, right bundle branch block first degree AV block  Assessment/Plan Chest pain - I will go ahead and admit him to telemetry unit, we'll cycle his cardiac enzymes x3, we'll repeat a 2-D echo. And consult his cardiologist in the morning for further workup as an outpatient. Check a TSH, B12 and RBC folate. -  He seems seems to be intravascularly depleted, his mucous membranes are dry, he is hyponatremic and hypochloremic. I will go ahead and give him IV fluids gently as he does have an ejection fraction of 25% check urinary sodium and urinary creatinine check basic metabolic panel in the morning. - We'll continue aspirin and Plavix we'll put salt his cardiologist in the morning.  HYPERTENSION: - I will hold his diuretic, continue ARB as his creatinine is at baseline also continue his beta blocker.    Chronic systolic heart failure: - He seems to be compensated, we'll start him on gentle IV fluids, continue his beta blocker and his ACE inhibitor. We'll hold his Lasix and spironolactone.    DM2 (diabetes mellitus, type 2): - Continue his NPH insulin. We'll add sliding scale.  Chronic kidney disease, stage III (moderate) - Creatinine seems to be at baseline. I will hold his diuretics, and some IV fluids and check a basic metabolic panel in the morning to   Code Status: full Family Communication: wife Disposition Plan: observation  Time spent: 60 minutes  Marinda Elk [facility] Pager [phone]  If 7PM-7AM, please contact night-coverage [email or web address] Password [facility] 10/15/2012, 6:22 PM

## 2012-10-15 NOTE — ED Notes (Signed)
Pt to xray before the meds could be given

## 2012-10-15 NOTE — ED Notes (Signed)
The pt has no chest pain at present.  Benadryl given for the rash over his body with swelling of the lt arm and hand. The pt reports that he  Was itching all over just before he left his house.  He also has some itching in the  Calf area of his lt leg.  Alert skin warm and dry.  Wife at the bedside

## 2012-10-15 NOTE — ED Notes (Signed)
The pt has no chest pain.  hhn finished.  He takes a hhn every day at 1700

## 2012-10-15 NOTE — ED Notes (Signed)
The pt is asleep.  His wife reports that he has been sleeping since i gave him the benadryl.  No distress.  Waiting on admission orders

## 2012-10-16 ENCOUNTER — Encounter (HOSPITAL_COMMUNITY): Payer: Self-pay | Admitting: Physician Assistant

## 2012-10-16 DIAGNOSIS — I059 Rheumatic mitral valve disease, unspecified: Secondary | ICD-10-CM

## 2012-10-16 DIAGNOSIS — E119 Type 2 diabetes mellitus without complications: Secondary | ICD-10-CM

## 2012-10-16 LAB — COMPREHENSIVE METABOLIC PANEL
ALT: 13 U/L (ref 0–53)
Albumin: 3.1 g/dL — ABNORMAL LOW (ref 3.5–5.2)
Alkaline Phosphatase: 71 U/L (ref 39–117)
Potassium: 4 mEq/L (ref 3.5–5.1)
Sodium: 135 mEq/L (ref 135–145)
Total Protein: 6.6 g/dL (ref 6.0–8.3)

## 2012-10-16 LAB — HEMOGLOBIN A1C: Hgb A1c MFr Bld: 6.2 % — ABNORMAL HIGH (ref <5.7)

## 2012-10-16 LAB — GLUCOSE, CAPILLARY
Glucose-Capillary: 93 mg/dL (ref 70–99)
Glucose-Capillary: 86 mg/dL (ref 70–99)
Glucose-Capillary: 91 mg/dL (ref 70–99)

## 2012-10-16 LAB — CBC
HCT: 37.8 % — ABNORMAL LOW (ref 39.0–52.0)
MCHC: 32.3 g/dL (ref 30.0–36.0)
MCV: 82.9 fL (ref 78.0–100.0)
RDW: 16 % — ABNORMAL HIGH (ref 11.5–15.5)
WBC: 6.3 10*3/uL (ref 4.0–10.5)

## 2012-10-16 LAB — TSH: TSH: 0.724 u[IU]/mL (ref 0.350–4.500)

## 2012-10-16 NOTE — Evaluation (Signed)
Physical Therapy Evaluation Patient Details Name: Mark French MRN: [id number] DOB: Jun 25, 1946 Today's Date: 10/16/2012 Time: 3086-5784 PT Time Calculation (min): 21 min  PT Assessment / Plan / Recommendation Clinical Impression  Patient is a 67 y/o male admitted with chest pain and significant past medical history of DM, neuropathy, PVD, multiple surgeries, previous MI s/p CABG, and CKD.  Presents with decreased safety and decreasd balance with mobility with fall risk.  States has HHPT already set up and cominig in addition to other services to help manage issues with disease at home.    PT Assessment  Patient needs continued PT services    Follow Up Recommendations  Home health PT    Does the patient have the potential to tolerate intense rehabilitation    N/A  Barriers to Discharge  None      Equipment Recommendations  None recommended by PT    Recommendations for Other Services   None  Frequency Min 3X/week    Precautions / Restrictions Precautions Precautions: Fall Precaution Comments: legally blind Restrictions Weight Bearing Restrictions: No   Pertinent Vitals/Pain C/o severe LE pain, RN aware      Mobility  Bed Mobility Details for Bed Mobility Assistance: Pt up in recliner upon arrival Transfers Sit to Stand: 5: Supervision;With upper extremity assist;With armrests;From chair/3-in-1 Stand to Sit: 5: Supervision;With upper extremity assist;With armrests;To chair/3-in-1 Ambulation/Gait Ambulation/Gait Assistance: 4: Min guard Ambulation Distance (Feet): 60 Feet Assistive device: Rolling walker Ambulation/Gait Assistance Details: initially ambulation without device till loss of balance needing assist to recover, then agreed to use walker Gait Pattern: Decreased weight shift to right;Decreased stride length;Lateral trunk lean to left        PT Diagnosis: Abnormality of gait;Generalized weakness  PT Problem List: Decreased activity tolerance;Decreased  balance;Decreased safety awareness PT Treatment Interventions: DME instruction;Gait training;Functional mobility training;Stair training   PT Goals Acute Rehab PT Goals PT Goal Formulation: With patient Time For Goal Achievement: 10/25/12 Potential to Achieve Goals: Good Pt will go Sit to Stand: with modified independence;with upper extremity assist PT Goal: Sit to Stand - Progress: Goal set today Pt will Stand: with no upper extremity support;Independently;3 - 5 min;Other (comment) (during simple functional tast) PT Goal: Stand - Progress: Goal set today Pt will Ambulate: 51 - 150 feet;with modified independence;with rolling walker PT Goal: Ambulate - Progress: Goal set today Pt will Perform Home Exercise Program: Independently PT Goal: Perform Home Exercise Program - Progress: Goal set today  Visit Information  Last PT Received On: 10/16/12 Assistance Needed: +1    Subjective Data  Subjective: I've been walking to the bathroom Patient Stated Goal: To return home today   Prior Functioning  Home Living Lives With: Spouse Available Help at Discharge: Family;Available 24 hours/day Type of Home: House Home Access: Ramped entrance Home Layout: One level Bathroom Shower/Tub: Walk-in shower;Curtain Bathroom Toilet: Handicapped height Home Adaptive Equipment: Wheelchair - powered;Quad cane;Shower chair with back;Grab bars in shower;Grab bars around toilet;Walker - rolling Prior Function Level of Independence: Independent Driving: No Vocation: Retired Musician: No difficulties Dominant Hand: Right    Cognition  Cognition Overall Cognitive Status: Appears within functional limits for tasks assessed/performed Arousal/Alertness: Awake/alert Orientation Level: Appears intact for tasks assessed Behavior During Session: [facility] for tasks performed    Extremity/Trunk Assessment Right Upper Extremity Assessment RUE ROM/Strength/Tone: [facility] for tasks assessed Left  Upper Extremity Assessment LUE ROM/Strength/Tone: WFL for tasks assessed Right Lower Extremity Assessment RLE ROM/Strength/Tone: [facility] for tasks assessed Left Lower Extremity Assessment LLE ROM/Strength/Tone:  WFL for tasks assessed   Balance Balance Balance Assessed: Yes Static Standing Balance Static Standing - Balance Support: No upper extremity supported Static Standing - Level of Assistance: 5: Stand by assistance Static Standing - Comment/# of Minutes: standing few seconds prior to ambulation Dynamic Standing Balance Dynamic Standing - Balance Support: No upper extremity supported Dynamic Standing - Level of Assistance: 3: Mod assist Dynamic Standing - Comments: while walking without UE support tendency to fall forward with increased gait speed uncontrolled, improved to walker  End of Session PT - End of Session Equipment Utilized During Treatment: Gait belt Activity Tolerance: Patient limited by pain Patient left: in chair;with call Mark/phone within reach;with nursing in room  GP     [facility] 10/16/2012, 12:14 PM [place], [place] [phone] 10/16/2012

## 2012-10-16 NOTE — Evaluation (Signed)
Occupational Therapy Evaluation and Discharge Patient Details Name: RUFFUS KAMAKA MRN: [id number] DOB: [birth year] Today's Date: 10/16/2012 Time: 9147-8295 OT Time Calculation (min): 16 min  OT Assessment / Plan / Recommendation Clinical Impression  This 67 yo male admitted with CP presents to acute OT without further OT needs, will have A prn at home. Acute OT will sign off.    OT Assessment  Patient does not need any further OT services    Follow Up Recommendations  No OT follow up       Equipment Recommendations  None recommended by OT          Precautions / Restrictions Precautions Precautions: Fall Restrictions Weight Bearing Restrictions: No   Pertinent Vitals/Pain Bil legs from knees to feet due to neuropathic pain    ADL  Equipment Used: Gait belt;Rolling walker Transfers/Ambulation Related to ADLs: S for sit to stand and stand to sit, min guard A for ambulation ADL Comments: Pt is at a min A in this enviroment for BADLs due to cannot get to his right foot to get his sock on (props his foot on a stool at home)         Visit Information  Last OT Received On: 10/16/12 Assistance Needed: +1 PT/OT Co-Evaluation/Treatment: Yes    Subjective Data  Subjective: I use a stool to prop my feet on to get my socks on  Patient Stated Goal: I hope to go home today   Prior Functioning     Home Living Lives With: Spouse Available Help at Discharge: Family;Available 24 hours/day Type of Home: House Home Access: Ramped entrance Home Layout: One level Bathroom Shower/Tub: Walk-in shower;Curtain Bathroom Toilet: Handicapped height Home Adaptive Equipment: Wheelchair - powered;Quad cane;Shower chair with back;Grab bars in shower;Grab bars around toilet;Walker - rolling Prior Function Level of Independence: Independent Driving: No Vocation: Retired Musician: No difficulties Dominant Hand: Right         Vision/Perception Vision -  History Baseline Vision: Legally blind Patient Visual Report: No change from baseline   Cognition  Cognition Overall Cognitive Status: Appears within functional limits for tasks assessed/performed Arousal/Alertness: Awake/alert Orientation Level: Appears intact for tasks assessed Behavior During Session: [facility] for tasks performed    Extremity/Trunk Assessment Right Upper Extremity Assessment RUE ROM/Strength/Tone: [facility] for tasks assessed Left Upper Extremity Assessment LUE ROM/Strength/Tone: WFL for tasks assessed Right Lower Extremity Assessment RLE ROM/Strength/Tone: [facility] for tasks assessed Left Lower Extremity Assessment LLE ROM/Strength/Tone: [facility] for tasks assessed     Mobility Bed Mobility Details for Bed Mobility Assistance: Pt up in recliner upon arrival Transfers Transfers: Sit to Stand;Stand to Sit Sit to Stand: 5: Supervision;With upper extremity assist;With armrests;From chair/3-in-1 Stand to Sit: 5: Supervision;With upper extremity assist;With armrests;To chair/3-in-1           End of Session OT - End of Session Equipment Utilized During Treatment: Gait belt Activity Tolerance: Patient tolerated treatment well Patient left: in chair;with call bell/phone within reach;with nursing in room  GO Functional Assessment Tool Used: Clinical judgement Functional Limitation: Self care Self Care Current Status (A2130): At least 1 percent but less than 20 percent impaired, limited or restricted Self Care Goal Status (Q6578): At least 1 percent but less than 20 percent impaired, limited or restricted Self Care Discharge Status [phone]): At least 1 percent but less than 20 percent impaired, limited or restricted   Evette Georges [phone] 10/16/2012, 11:34 AM

## 2012-10-16 NOTE — Consult Note (Signed)
CARDIOLOGY CONSULT NOTE  Patient ID: Mark French, MRN: [id number], DOB/AGE: 1945-10-07 67 y.o. Admit date: 10/15/2012   Date of Consult: 10/16/2012 Primary Physician: [name], MD Primary Cardiologist: Excell Seltzer  Chief Complaint: chest pain Reason for Consult: chest pain  HPI: Mark French is a 67 y/o M with history of chronic systolic CHF (most recent EF 40-45%, baseline wt 230-232), CAD (CABG 1998, inferior STEMI 08/2011 s/p DES to SVG-diag, transient CHB reqiring CPR and TTVP), CKD stage II (Cr 1.2-1.4), DM, HTN, HL, GI bleed 2012, legally blind, anemia, COPD who presented to [facility] with complaints of CP.  He uses a motorized scooter about 80% of the time, but is able to get around short distances with a walker or cane. He has had a lot of nasal congestion and sinus drainage the last week. Yesterday around 2:30pm while at rest watching TV, he developed sensation of substernal chest tightness. His wife notes he had a difficult time speaking in a normal volume and had to whisper. He also felt nauseated. He took a generic anti-nausea liquid and drank some water but did not feel better so took a NTG. This did not help. Instead it made him feel like he needed to throw up. He made it to the bathroom and vomited copious amounts of clear mucus for 3 minutes, almost like he was "choking on it." This helped relieve his pain somewhat. His wife called EMS. While at the hospital, his chest pain gradually resolved. The whole episode lasted about 30 minutes. He received albuterol/atrovent neb as well as 25mg  IV benadryl for L arm itching/bumps/swelling that he hadn't noticed until up at that point. This improved. He denies any associated SOB, diaphoresis, palpitations, near syncope or syncope with this episode. He has not had any further CP. He does not typically get CP or SOB when he ambulates his limited distances at home. Sleeps in recliner chronically, no PND or weight changes. Weight is 229 today.  He was felt to be mildly hypovolemic on admission with hyponatremia, hypochloremia, and dry mucus membranes, thus was gently hydrated and Demadex/Spiro held with improvement in lytes. Troponin neg x 3 thus far, Hgb 12.2, LFTs essentially unremarkable, Cr  1.47->1.33. CXR without acute disease. He currently feels well without chest pain. He feels at baseline.  Past Medical History  Diagnosis Date  . CORONARY ARTERY DISEASE 03/11/2007    s/p CABG 1998;  admitted 08/2011 with an inferior STEMI.  LHC 09/07/11: Severe  Three-vessel CAD, LIMA-LAD patent, SVG-RCA chronically occluded, SVG-circumflex chronically occluded, SVG-diagonal 95%.  PCI: Promus DES to the SVG-diagonal.  Procedure c/b transient CHB req CPR and TTVP;  myoview 4/13:large severe inferolateral infarct, no ischemia, EF 25%    . DIABETES MELLITUS, TYPE I 03/11/2007  . HYPERTENSION 03/11/2007  . 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 HYPERCHOLESTEROLEMIA 09/28/2008  . CERVICAL RADICULOPATHY, LEFT 09/23/2008  . PERIPHERAL NEUROPATHY 09/23/2008  . Chronic systolic heart failure 09/23/2008    Echocardiogram 09/07/11: Difficult study, inferior and posterior lateral wall hypokinesis, mild LVH, EF 45%, mild LAE.   [name] RENAL INSUFFICIENCY 03/15/2010  . ED (erectile dysfunction)   . Hypogonadism male   . GI bleed 2012    Multiple Duodenal ulcer   . Obesity   . Legally blind     bilaterally  . Stroke 23    age 45 months; residual right sided weakness  . History of blood transfusion 2012  . Charcot foot due to diabetes mellitus     chronic  pain  . Osteoarthritis     right hip and shoulder  . COPD (chronic obstructive pulmonary disease)   . Myocardial infarction 2013b Jan  . PERIPHERAL VASCULAR INSUFFICIENCY,  LEGS, BILATERAL 08/17/2010    ABI's performed 04/19/11 R 0.72 L 0.71  . ANEMIA-IRON DEFICIENCY 09/23/2008    ulcer related last year  . Chronic edema     Chronic pedal edema      Most Recent Cardiac Studies: 2D Echo 04/27/12  Study Conclusions - Left ventricle: The cavity size was normal. Systolic function was mildly to moderately reduced. The estimated ejection fraction was in the range of 40% to 45%. Severe hypokinesis of the inferolateral myocardium. Doppler parameters are consistent with a reversible restrictive pattern, indicative of decreased left ventricular diastolic compliance and/or increased left atrial pressure (grade 3 diastolic dysfunction). - Mitral valve: Mildly calcified annulus. Mild regurgitation. - Left atrium: The atrium was moderately to severely dilated. - Right ventricle: The cavity size was mildly dilated. Wall thickness was normal. - Pulmonary arteries: Systolic pressure was moderately increased. PA peak pressure: 47mm Hg (S    Surgical History:  Past Surgical History  Procedure Laterality Date  . Carpal tunnel release      left  . Sigmoidoscopy  03/11/2001  . Venous doppler  01/30/2004  . Tonsillectomy and adenoidectomy      "as a kid"  . Cataract extraction w/ intraocular lens  implant, bilateral    . Coronary angioplasty with stent placement  08/2011    "1"  . Retinopathy surgery  2000's    "laser; both eyes"  . Wrist fusion  1976    right  . Anterior fusion cervical spine  2010    "C spine; Dr. Yetta Barre"  . Ankle fracture surgery  1976; ?date    LEFT:  fused; removed bulk of hardware  . Heel spur surgery  ~ 2007    ? left  . Irrigation and debridement knee  02/27/2012    Procedure: IRRIGATION AND DEBRIDEMENT KNEE;  Surgeon: Verlee Rossetti, MD;  [facility] OR;  Service: Orthopedics;  Laterality: Right;  irrigation and drainage right knee septic bursitis  . Eye surgery  2008    cataract removal, cautery  . Coronary artery bypass graft  1998    CABG X5  . Fracture surgery      foot broken 1976     Home Meds: Prior to Admission medications   Medication Sig Start Date End Date Taking? Authorizing Provider  albuterol (PROVENTIL HFA;VENTOLIN HFA) 108 (90 BASE)  MCG/ACT inhaler Inhale 2 puffs into the lungs every 6 (six) hours as needed. For shortness of breath   Yes Historical Provider, MD  aspirin 325 MG EC tablet Take 81 mg by mouth daily. Patient's wife cuts into fourths, gives @ 81mg  daily.   Yes Historical Provider, MD  atorvastatin (LIPITOR) 40 MG tablet Take 1 tablet (40 mg total) by mouth daily. 04/09/12 04/09/13 Yes Tonny Bollman, MD  carvedilol (COREG) 6.25 MG tablet Take 6.25 mg by mouth 2 (two) times daily with a meal.   Yes Historical Provider, MD  clopidogrel (PLAVIX) 75 MG tablet Take 1 tablet (75 mg total) by mouth daily with breakfast. 06/05/12 06/05/13 Yes [name], MD  clotrimazole (LOTRIMIN) 1 % cream Apply 1 application topically daily as needed (for rash around mid section).   Yes Historical Provider, MD  co-enzyme Q-10 30 MG capsule Take 30 mg by mouth daily.   Yes Historical Provider, MD  Docusate Sodium (STOOL SOFTENER)  100 MG capsule Take 100 mg by mouth daily as needed. constipation   Yes Historical Provider, MD  Fluticasone-Salmeterol (ADVAIR DISKUS) 100-50 MCG/DOSE AEPB Inhale 1 puff into the lungs 2 (two) times daily. 12/25/11  Yes [name], MD  gabapentin (NEURONTIN) 600 MG tablet Take 1 tablet (600 mg total) by mouth 3 (three) times daily. 06/25/12 06/25/13 Yes [name], MD  insulin NPH (HUMULIN N,NOVOLIN N) 100 UNIT/ML injection Inject 70 Units into the skin daily before breakfast.   Yes Historical Provider, MD  isosorbide mononitrate (IMDUR) 30 MG 24 hr tablet Take 1 tablet (30 mg total) by mouth daily. 08/14/12 08/14/13 Yes Tonny Bollman, MD  losartan (COZAAR) 50 MG tablet Take 50 mg by mouth daily.   Yes Historical Provider, MD  pantoprazole (PROTONIX) 40 MG tablet Take 40 mg by mouth daily.   Yes Historical Provider, MD  potassium chloride SA (K-DUR,KLOR-CON) 20 MEQ tablet Take 1 tablet (20 mEq total) by mouth daily. 05/22/12  Yes Tonny Bollman, MD  silodosin (RAPAFLO) 8 MG CAPS capsule Take 8 mg by mouth daily with  breakfast.   Yes Historical Provider, MD  spironolactone (ALDACTONE) 25 MG tablet Take 12.5 mg by mouth daily.   Yes Historical Provider, MD  tiotropium (SPIRIVA) 18 MCG inhalation capsule Place 1 capsule (18 mcg total) into inhaler and inhale daily. 10/08/12 10/08/13 Yes Kalman Shan, MD  torsemide (DEMADEX) 20 MG tablet Take 20-40 mg by mouth 2 (two) times daily. Takes 40mg  every morning, and 20mg  every afternoon   Yes Historical Provider, MD    Inpatient Medications:  . aspirin  81 mg Oral Daily  . atorvastatin  40 mg Oral q1800  . carvedilol  6.25 mg Oral BID WC  . clopidogrel  75 mg Oral Q breakfast  . heparin  5,000 Units Subcutaneous Q8H  . insulin aspart  0-9 Units Subcutaneous TID WC  . insulin aspart  3 Units Subcutaneous TID WC  . insulin NPH  70 Units Subcutaneous QAC breakfast  . isosorbide mononitrate  30 mg Oral Daily  . losartan  50 mg Oral Daily  . mometasone-formoterol  2 puff Inhalation BID  . pantoprazole  40 mg Oral Q1200  . sodium chloride  3 mL Intravenous Q12H  . tamsulosin  0.4 mg Oral QPC supper  . tiotropium  18 mcg Inhalation Daily    Allergies:  Allergies  Allergen Reactions  . Sildenafil Nausea Only and Other (See Comments)    "took it once; heart went to pieces; never took it again"  . Percocet (Oxycodone-Acetaminophen) Other (See Comments)    Unknown reaction-possible altered mental state-per wife.    . Contrast Media (Iodinated Diagnostic Agents) Nausea Only    History   Social History  . Marital Status: Married    Spouse Name: N/A    Number of Children: 0  . Years of Education: N/A   Occupational History  . Disabled    Social History Main Topics  . Smoking status: Never Smoker   . Smokeless tobacco: Never Used  . Alcohol Use: No  . Drug Use: No  . Sexually Active: No   Other Topics Concern  . Not on file   Social History Narrative   Comes to appointments with wife      0 Caffeine drinks daily      Family History  Problem  Relation Age of Onset  . Heart disease Mother     Coronary Artery Disease  . Heart disease Father  Coronary Artery Disease  . Heart disease Paternal Grandmother     Coronary Artery Disease  . Colon cancer Neg Hx   . Diabetes Other     Grandmother     Review of Systems: General: negative for chills, fever, night sweats or weight changes.  Cardiovascular:see above. Does report occasional LE cramping. Dermatological: see above Respiratory: negative for cough or wheezing Urologic: negative for hematuria Abdominal: negative for diarrhea, bright red blood per rectum, melena, or hematemesis Neurologic: negative for visual changes, syncope, or dizziness All other systems reviewed and are otherwise negative except as noted above.  Labs:  Recent Labs  10/15/12 1600 10/15/12 2136 10/16/12 0255 10/16/12 1010  TROPONINI <0.30 <0.30 <0.30 <0.30   Lab Results  Component Value Date   WBC 6.3 10/16/2012   HGB 12.2* 10/16/2012   HCT 37.8* 10/16/2012   MCV 82.9 10/16/2012   PLT 181 10/16/2012    Recent Labs Lab 10/16/12 1008  NA 135  K 4.0  CL 99  CO2 27  BUN 25*  CREATININE 1.33  CALCIUM 8.8  PROT 6.6  BILITOT 0.4  ALKPHOS 71  ALT 13  AST 15  GLUCOSE 89   Lab Results  Component Value Date   CHOL 96 01/12/2012   HDL 45 01/12/2012   LDLCALC 30 01/12/2012   TRIG 107 01/12/2012     Radiology/Studies:  Dg Chest 2 View 10/15/2012  *RADIOLOGY REPORT*  Clinical Data: Chest pain and cough  CHEST - 2 VIEW  Comparison: 04/26/2012  Findings: Previous median sternotomy and CABG procedure. Mild cardiac enlargement.  No pleural effusion or edema.  No airspace consolidation identified.  Spondylosis noted within the thoracic spine.  IMPRESSION:  1.  No acute cardiopulmonary abnormalities. 2.  Thoracic spondylosis. 3.  Prior CABG.   Original Report Authenticated By: Signa Kell, M.D.    EKG: NSR 71bpm first degree AVB, inferior infarct age indeterminate, laterla infarct age indeterminate,  RBBB, nonspecific ST-T changes. No signif change from prior  Physical Exam: Blood pressure 134/52, pulse 69, temperature 97.9 F (36.6 C), temperature source Oral, resp. rate 18, height 5\' 6"  (1.676 m), weight 229 lb 9.6 oz (104.146 kg), SpO2 98.00%. General: Well developed obese WM in no acute distress. Head: Normocephalic, atraumatic, sclera non-icteric, no xanthomas, nares are without discharge.  Neck: JVD not elevated. Lungs: Clear bilaterally to auscultation without wheezes, rales, or rhonchi. Breathing is unlabored. Heart: RRR with S1 S2. No murmurs, rubs, or gallops appreciated. Abdomen: Soft, non-tender, rounded non-distended with normoactive bowel sounds. No hepatomegaly. No rebound/guarding. No obvious abdominal masses. Msk:  Strength and tone appear normal for age. Extremities: No clubbing or cyanosis. 1+ pretibial edema with venous stasis hyperpigmentation changes.  Distal pedal pulses are 2+ and equal bilaterally. Very subtle puffiness of dorsum L hand compared to right, no rash on UE Neuro: Alert and oriented X 3. No facial asymmetry. No focal deficit except legally blind. Moves all extremities spontaneously. Psych:  Responds to questions appropriately with a normal affect.   Assessment and Plan:   1. Chest pain, somewhat atypical 2. Chronic systolic heart failure with mild volume depletion this admission 3. CAD s/p CABG 1998, inferior STEMI 08/2011 s/p DES to SVG-diag 4. HTN 5. Diabetes mellitus with neuropathy 6. Chronic renal insufficiency stage II 7. COPD  8. Chronic pedal edema 9. Peripheral vascular disease 10. ?Allergic reaction L arm, resolved  Chest pain sounds atypical, question GI in origin versus related to pulm (i.e. mucus congestion drainage into stomach, causing symptoms).  No objective evidence of ischemia thus far. 2D echo results from earlier are pending. Will discuss this along with diuretic dosing with MD. He has a f/u appt with Dr. Excell Seltzer 10/29/12 at  2:45pm.  Signed, Dayna Dunn PA-C 10/16/2012, 1:27 PM  Patient seen with PA, agree with the above note.  He had 30 minutes of chest tightness yesterday associated with nausea and belching.  No further chest symptoms and cardiac enzymes were unremarkable.  No ECG changes.  It is possible that his symptoms are GI-related.  However, given extensive CAD history, he probably should have a stress test done.  Symptomatically, he has been at baseline except for the CP episode yesterday.  - Continue prior home meds.  - Echo was done today but no yet read, will followup on this.  - May go home today, will arrange Steffanie Dunn as outpatient.  Marca Ancona 10/16/2012 3:58 PM

## 2012-10-16 NOTE — Progress Notes (Signed)
  Echocardiogram 2D Echocardiogram has been performed.  Mark French, Mark French 10/16/2012, 3:19 PM

## 2012-10-16 NOTE — Progress Notes (Signed)
[facility] PROGRESS NOTE  Mark French ZOX:[id number] DOB: June 04, 1946 DOA: 10/15/2012 PCP: [name], MD  Assessment/Plan: Active Problems:   HYPERTENSION   MYOCARDIAL INFARCTION, HX OF   Chronic systolic heart failure   DM2 (diabetes mellitus, type 2)   Chronic kidney disease, stage III (moderate)   Chest pain    1. Chest pain: Patient presented with a single episode of retrosternal chest tightness, without SOB, against a known background of extensive CAD, s/p CABG and previous MI. He has had no recurrence, and cardiac enzymes are un-elevated so far. 12-Lead EKG showed no acute ischemic changes and patients last episode of chest pain, was about 6 months ago. Suspect chronic stable angina. Will consult Dr Tonny Bollman et al, for recommendations. Continued on Aspirin and Plavix 2. Dehydration/Hyponatremia: Clinically, patient appeared mildly dehydrated at presentation, mucous membranes, hyponatremic and hypochloremic, likely due to diuretic therapy. Managing with gentle iv fluids. Watching volume status closely. Diuretics on hold.  3. Chronic systolic heart failure: known EF of 25%. Patient seems clinically well compensated at this time. Continued on beta blocker and ACE inhibitor. 4. HTN: BP is controlled at this time.  5. DM2 (diabetes mellitus, type 2): Controlled on NPH insulin/SSI.  6. Chronic kidney disease, stage III: Creatinine seems to be at baseline. Following renal indices.  7. Chronic venous insufficiency/PVD:  Stasis eczema is noted on bilateral LE, and patient has healing wounds on left shin. Peripheral pulses are palpable. Managed with Unna wraps. Follow up at wound clinic.    Code Status: Full Code.  Family Communication:  Disposition Plan: To be determined.    Brief narrative: 67 y.o. male with known CAD s/p prior CABG, s/p MI about 2 years ago ([place]), chronic systolic CHF, EF 81% via myoview 4/13, HTN, DM, with retinopathy/Legally blind, CKD with  baseline Creatine 1.2-1.4, anemia, chronic venous insufficiency/chronic leg wounds, PVD, COPD (Ramaswamy), presenting to ED with retrosternal chest pain. He was sitting down when this occurred, and was only partially relieved by SL NTG. First set of cardiac enzymes were negative and his EKG showed sinus tachycardia with right bundle branch, first-degree AV block. Admitted for further management.    Consultants:  [facility].   Procedures:  CXR  Antibiotics:  N/A.   HPI/Subjective: Asymptomatic.   Objective: Vital signs in last 24 hours: Temp:  [97.7 F (36.5 C)-98 F (36.7 C)] 97.9 F (36.6 C) (03/12 0436) Pulse Rate:  [69-87] 69 (03/12 0436) Resp:  [16-20] 18 (03/12 0436) BP: (115-149)/(52-70) 134/52 mmHg (03/12 0436) SpO2:  [99 %-100 %] 100 % (03/12 0436) Weight:  [103.874 kg (229 lb)-105.87 kg (233 lb 6.4 oz)] 104.146 kg (229 lb 9.6 oz) (03/12 0436) Weight change:  Last BM Date: 10/15/12  Intake/Output from previous day: 03/11 0701 - 03/12 0700 In: 360 [P.O.:360] Out: 2075 [Urine:2075] Total I/O In: 240 [P.O.:240] Out: -    Physical Exam: General: Comfortable, alert, communicative, fully oriented, not short of breath at rest.  HEENT:  No clinical pallor, no jaundice, no conjunctival injection or discharge. NECK:  Supple, JVP not seen, no carotid bruits, no palpable lymphadenopathy, no palpable goiter. CHEST:  Clinically clear to auscultation, no wheezes, no crackles. HEART:  Sounds 1 and 2 heard, normal, regular, no murmurs. ABDOMEN:  Obese, soft, non-tender, no palpable organomegaly, no palpable masses, normal bowel sounds. GENITALIA:  Not examined. LOWER EXTREMITIES:  Stasis eczema is noted on bilateral LE, and patient has healing wounds on left shin. Peripheral pulses are palpable. MUSCULOSKELETAL SYSTEM:  Generalized osteoarthritic changes, otherwise, normal. CENTRAL NERVOUS SYSTEM:  No focal neurologic deficit on gross examination.  Lab  Results:  Recent Labs  10/15/12 1603 10/15/12 2135  WBC 7.0 11.0*  HGB 13.3 14.0  HCT 40.2 42.7  PLT 198 204    Recent Labs  10/15/12 1603 10/15/12 2135  NA 130*  --   K 4.4  --   CL 95*  --   CO2 24  --   GLUCOSE 121*  --   BUN 29*  --   CREATININE 1.47* 1.42*  CALCIUM 8.6  --    No results found for this or any previous visit (from the past 240 hour(s)).   Studies/Results: Dg Chest 2 View  10/15/2012  *RADIOLOGY REPORT*  Clinical Data: Chest pain and cough  CHEST - 2 VIEW  Comparison: 04/26/2012  Findings: Previous median sternotomy and CABG procedure. Mild cardiac enlargement.  No pleural effusion or edema.  No airspace consolidation identified.  Spondylosis noted within the thoracic spine.  IMPRESSION:  1.  No acute cardiopulmonary abnormalities. 2.  Thoracic spondylosis. 3.  Prior CABG.   Original Report Authenticated By: Signa Kell, M.D.     Medications: Scheduled Meds: . aspirin  81 mg Oral Daily  . atorvastatin  40 mg Oral q1800  . carvedilol  6.25 mg Oral BID WC  . clopidogrel  75 mg Oral Q breakfast  . heparin  5,000 Units Subcutaneous Q8H  . insulin aspart  0-9 Units Subcutaneous TID WC  . insulin aspart  3 Units Subcutaneous TID WC  . insulin NPH  70 Units Subcutaneous QAC breakfast  . isosorbide mononitrate  30 mg Oral Daily  . losartan  50 mg Oral Daily  . mometasone-formoterol  2 puff Inhalation BID  . pantoprazole  40 mg Oral Q1200  . sodium chloride  3 mL Intravenous Q12H  . tamsulosin  0.4 mg Oral QPC supper  . tiotropium  18 mcg Inhalation Daily  . [COMPLETED] sodium chloride   Intravenous STAT   Continuous Infusions: . sodium chloride 50 mL/hr at 10/15/12 2049   PRN Meds:.acetaminophen, acetaminophen, albuterol, HYDROcodone-acetaminophen, ondansetron (ZOFRAN) IV, ondansetron    LOS: 1 day   OTI,CHRISTOPHER  [facility] Pager [phone]. If 8PM-8AM, please contact night-coverage at [email or web address], password [facility] 10/16/2012, 8:34 AM   LOS: 1 day

## 2012-10-16 NOTE — Discharge Summary (Addendum)
Physician Discharge Summary  Mark French ZOX:[id number] DOB: 1945-12-15 DOA: 10/15/2012  PCP: [name], MD  Admit date: 10/15/2012 Discharge date: 10/16/2012  Time spent: 40 minutes  Recommendations for Outpatient Follow-up:  1. Follow up with PMD. 2. Follow up with primary cardiologist.   Discharge Diagnoses:  Active Problems:   HYPERTENSION   MYOCARDIAL INFARCTION, HX OF   Chronic systolic heart failure   DM2 (diabetes mellitus, type 2)   Chronic kidney disease, stage III (moderate)   Chest pain   Discharge Condition: Satisfactory.   Diet recommendation: Heart-Healthy/Carbohydrate-Modified.   Filed Weights   10/15/12 1557 10/15/12 2018 10/16/12 0436  Weight: 103.874 kg (229 lb) 105.87 kg (233 lb 6.4 oz) 104.146 kg (229 lb 9.6 oz)    History of present illness:  67 y.o. male with known CAD s/p prior CABG, s/p MI about 2 years ago ([place]), chronic systolic CHF, EF 81% via myoview 4/13, HTN, DM, with retinopathy/Legally blind, CKD with baseline Creatine 1.2-1.4, anemia, chronic venous insufficiency/chronic leg wounds, PVD, COPD (Ramaswamy), presenting to ED with retrosternal chest pain. He was sitting down when this occurred, and was only partially relieved by SL NTG. First set of cardiac enzymes were negative and his EKG showed sinus tachycardia with right bundle branch, first-degree AV block. Admitted for further management.    Hospital Course:  1. Chest pain: Patient presented with a single episode of retrosternal chest tightness, without SOB, against a known background of extensive CAD, s/p CABG and previous MI. He has had no recurrence, and cardiac enzymes are un-elevated so far. 12-Lead EKG showed no acute ischemic changes and patients last episode of chest pain, was about 6 months ago. Suspect chronic stable angina. Dr dalton Shirlee Latch provided cardiology consultation and deemed patient's chest pain atypical. Continued on Aspirin and Plavix. Otpatient follow with  cardiology and stress myoview, has benn arranged.  2. Dehydration/Hyponatremia: Clinically, patient appeared mildly dehydrated at presentation, mucous membranes, hyponatremic and hypochloremic, likely due to diuretic therapy. Managed with gentle iv fluids, with resolution. Diuretics were temporarily held, but reyumed on discharge.  3. Chronic systolic heart failure: known EF of 25%. Patient seems clinically well compensated at this time. Continued on beta blocker and ACE inhibitor.  4. HTN: BP is controlled at this time.  5. DM2 (diabetes mellitus, type 2): Controlled on NPH insulin/SSI.  6. Chronic kidney disease, stage III: Creatinine seems to be at baseline. Following renal indices.  7. Chronic venous insufficiency/PVD: Stasis eczema is noted on bilateral LE, and patient has healing wounds on left shin. Peripheral pulses are palpable. Managed with Unna wraps. Follow up at wound clinic.      Procedures:  See Below.   Consultations:  Dr Marca Ancona, cardiologist.   Discharge Exam: Filed Vitals:   10/15/12 1855 10/15/12 2018 10/16/12 0436 10/16/12 1139  BP: 115/54 136/60 134/52   Pulse: 85 87 69   Temp:  98 F (36.7 C) 97.9 F (36.6 C)   TempSrc:  Oral Oral   Resp: 20 18 18    Height:  5\' 6"  (1.676 m)    Weight:  105.87 kg (233 lb 6.4 oz) 104.146 kg (229 lb 9.6 oz)   SpO2: 100% 100% 100% 98%   Physical Exam:  General: Comfortable, alert, communicative, fully oriented, not short of breath at rest.  HEENT: No clinical pallor, no jaundice, no conjunctival injection or discharge.  NECK: Supple, JVP not seen, no carotid bruits, no palpable lymphadenopathy, no palpable goiter.  CHEST: Clinically clear to auscultation, no  wheezes, no crackles.  HEART: Sounds 1 and 2 heard, normal, regular, no murmurs.  ABDOMEN: Obese, soft, non-tender, no palpable organomegaly, no palpable masses, normal bowel sounds.  GENITALIA: Not examined.  LOWER EXTREMITIES: Stasis eczema is noted on bilateral  LE, and patient has healing wounds on left shin. Peripheral pulses are palpable.  MUSCULOSKELETAL SYSTEM: Generalized osteoarthritic changes, otherwise, normal.  CENTRAL NERVOUS SYSTEM: No focal neurologic deficit on gross examination.  Discharge Instructions      Discharge Orders   Future Appointments Tonesha Tsou Department Dept Phone   10/22/2012 9:15 AM [facility] [place] [facility]) [facility] SITE 3 NUCLEAR MED [phone]   10/29/2012 2:45 PM Tonny Bollman, MD [facility] Main Office [place]) [phone]   Future Orders Complete By Expires     Diet - low sodium heart healthy  As directed     Diet Carb Modified  As directed     Increase activity slowly  As directed         Medication List    TAKE these medications       albuterol 108 (90 BASE) MCG/ACT inhaler  Commonly known as:  PROVENTIL HFA;VENTOLIN HFA  Inhale 2 puffs into the lungs every 6 (six) hours as needed. For shortness of breath     aspirin 325 MG EC tablet  Take 81 mg by mouth daily. Patient's wife cuts into fourths, gives @ 81mg  daily.     atorvastatin 40 MG tablet  Commonly known as:  LIPITOR  Take 1 tablet (40 mg total) by mouth daily.     carvedilol 6.25 MG tablet  Commonly known as:  COREG  Take 6.25 mg by mouth 2 (two) times daily with a meal.     clopidogrel 75 MG tablet  Commonly known as:  PLAVIX  Take 1 tablet (75 mg total) by mouth daily with breakfast.     clotrimazole 1 % cream  Commonly known as:  LOTRIMIN  Apply 1 application topically daily as needed (for rash around mid section).     co-enzyme Q-10 30 MG capsule  Take 30 mg by mouth daily.     Fluticasone-Salmeterol 100-50 MCG/DOSE Aepb  Commonly known as:  ADVAIR DISKUS  Inhale 1 puff into the lungs 2 (two) times daily.     gabapentin 600 MG tablet  Commonly known as:  NEURONTIN  Take 1 tablet (600 mg total) by mouth 3 (three) times daily.     insulin NPH 100 UNIT/ML injection  Commonly known as:   HUMULIN N,NOVOLIN N  Inject 70 Units into the skin daily before breakfast.     isosorbide mononitrate 30 MG 24 hr tablet  Commonly known as:  IMDUR  Take 1 tablet (30 mg total) by mouth daily.     losartan 50 MG tablet  Commonly known as:  COZAAR  Take 50 mg by mouth daily.     pantoprazole 40 MG tablet  Commonly known as:  PROTONIX  Take 40 mg by mouth daily.     potassium chloride SA 20 MEQ tablet  Commonly known as:  K-DUR,KLOR-CON  Take 1 tablet (20 mEq total) by mouth daily.     RAPAFLO 8 MG Caps capsule  Generic drug:  silodosin  Take 8 mg by mouth daily with breakfast.     spironolactone 25 MG tablet  Commonly known as:  ALDACTONE  Take 12.5 mg by mouth daily.     STOOL SOFTENER 100 MG capsule  Generic drug:  Docusate Sodium  Take  100 mg by mouth daily as needed. constipation     tiotropium 18 MCG inhalation capsule  Commonly known as:  SPIRIVA  Place 1 capsule (18 mcg total) into inhaler and inhale daily.     torsemide 20 MG tablet  Commonly known as:  DEMADEX  Take 20-40 mg by mouth 2 (two) times daily. Takes 40mg  every morning, and 20mg  every afternoon       Follow-up Information   Follow up with [facility]. (Stress Test at 10/22/12 at 9:15am)    Contact information:   [address] [place] [address] [phone]       Follow up with Tonny Bollman, MD. (10/29/12 at 2:45pm)    Contact information:   [address] [place] [address] [phone]      Schedule an appointment as soon as possible for a visit with [name], MD.   Contact information:   [address] [place] [address] [phone]        The results of significant diagnostics from this hospitalization (including imaging, microbiology, ancillary and laboratory) are listed below for reference.    Significant Diagnostic Studies: Dg Chest 2 View  10/15/2012  *RADIOLOGY REPORT*  Clinical Data: Chest pain and cough   CHEST - 2 VIEW  Comparison: 04/26/2012  Findings: Previous median sternotomy and CABG procedure. Mild cardiac enlargement.  No pleural effusion or edema.  No airspace consolidation identified.  Spondylosis noted within the thoracic spine.  IMPRESSION:  1.  No acute cardiopulmonary abnormalities. 2.  Thoracic spondylosis. 3.  Prior CABG.   Original Report Authenticated By: Signa Kell, M.D.    Dg Hip Complete Left  09/24/2012  *RADIOLOGY REPORT*  Clinical Data: Left lateral hip pain  LEFT HIP - COMPLETE 2+ VIEW  Comparison: 09/12/2011  Findings: Normal alignment without fracture.  Visualized left hemi pelvis intact.  No diastasis.  No significant degenerative process.  IMPRESSION: No acute finding   Original Report Authenticated By: Judie Petit. Miles Costain, M.D.     Microbiology: No results found for this or any previous visit (from the past 240 hour(s)).   Labs: Basic Metabolic Panel:  Recent Labs Lab 10/15/12 1603 10/15/12 2135 10/16/12 1008  NA 130*  --  135  K 4.4  --  4.0  CL 95*  --  99  CO2 24  --  27  GLUCOSE 121*  --  89  BUN 29*  --  25*  CREATININE 1.47* 1.42* 1.33  CALCIUM 8.6  --  8.8   Liver Function Tests:  Recent Labs Lab 10/15/12 1603 10/16/12 1008  AST 17 15  ALT 14 13  ALKPHOS 75 71  BILITOT 0.3 0.4  PROT 6.6 6.6  ALBUMIN 3.1* 3.1*   No results found for this basename: LIPASE, AMYLASE,  in the last 168 hours No results found for this basename: AMMONIA,  in the last 168 hours CBC:  Recent Labs Lab 10/15/12 1603 10/15/12 2135 10/16/12 1008  WBC 7.0 11.0* 6.3  NEUTROABS 5.5  --   --   HGB 13.3 14.0 12.2*  HCT 40.2 42.7 37.8*  MCV 82.2 84.4 82.9  PLT 198 204 181   Cardiac Enzymes:  Recent Labs Lab 10/15/12 1600 10/15/12 2136 10/16/12 0255 10/16/12 1010  TROPONINI <0.30 <0.30 <0.30 <0.30   BNP: BNP (last 3 results)  Recent Labs  01/30/12 1619 04/26/12 1944 08/02/12 1114  PROBNP 207.0* 788.0* 248.0*   CBG:  Recent Labs Lab 10/15/12 2150  10/16/12 0651 10/16/12 1124  GLUCAP 129* 93 86       Signed:  OTI,CHRISTOPHER  [facility] 10/16/2012, 4:10 PM

## 2012-10-16 NOTE — Progress Notes (Signed)
Utilization Review Completed.   Kimberly Tucker, RN, BSN Nurse Case Manager  [phone]  

## 2012-10-17 NOTE — ED Provider Notes (Signed)
Medical screening examination/treatment/procedure(s) were performed by non-physician practitioner and as supervising physician I was immediately available for consultation/collaboration.  Stephen Kohut, MD 10/17/12 0102 

## 2012-10-22 ENCOUNTER — Ambulatory Visit (HOSPITAL_COMMUNITY): Payer: Medicare Other | Attending: Cardiology | Admitting: Radiology

## 2012-10-22 VITALS — BP 120/75 | Ht 64.0 in | Wt 228.0 lb

## 2012-10-22 DIAGNOSIS — J4489 Other specified chronic obstructive pulmonary disease: Secondary | ICD-10-CM | POA: Insufficient documentation

## 2012-10-22 DIAGNOSIS — R079 Chest pain, unspecified: Secondary | ICD-10-CM

## 2012-10-22 DIAGNOSIS — I10 Essential (primary) hypertension: Secondary | ICD-10-CM | POA: Insufficient documentation

## 2012-10-22 DIAGNOSIS — J449 Chronic obstructive pulmonary disease, unspecified: Secondary | ICD-10-CM | POA: Insufficient documentation

## 2012-10-22 DIAGNOSIS — E119 Type 2 diabetes mellitus without complications: Secondary | ICD-10-CM | POA: Insufficient documentation

## 2012-10-22 DIAGNOSIS — I739 Peripheral vascular disease, unspecified: Secondary | ICD-10-CM | POA: Insufficient documentation

## 2012-10-22 DIAGNOSIS — I2510 Atherosclerotic heart disease of native coronary artery without angina pectoris: Secondary | ICD-10-CM

## 2012-10-22 DIAGNOSIS — R0789 Other chest pain: Secondary | ICD-10-CM | POA: Insufficient documentation

## 2012-10-22 MED ORDER — TECHNETIUM TC 99M SESTAMIBI GENERIC - CARDIOLITE
30.0000 | Freq: Once | INTRAVENOUS | Status: AC | PRN
  Administered 2012-10-22: 30 via INTRAVENOUS

## 2012-10-22 MED ORDER — TECHNETIUM TC 99M SESTAMIBI GENERIC - CARDIOLITE
10.0000 | Freq: Once | INTRAVENOUS | Status: AC | PRN
  Administered 2012-10-22: 10 via INTRAVENOUS

## 2012-10-22 MED ORDER — REGADENOSON 0.4 MG/5ML IV SOLN
0.4000 mg | Freq: Once | INTRAVENOUS | Status: AC
  Administered 2012-10-22: 0.4 mg via INTRAVENOUS

## 2012-10-22 NOTE — Progress Notes (Signed)
[facility] SITE 3 NUCLEAR MED [address] [place], [place] [address] [phone]    Cardiology Nuclear Med Study  Mark French is a 67 y.o. male     MRN : [id number]     DOB: [birth year]  Procedure Date: 10/22/2012  Nuclear Med Background Indication for Stress Test:  Evaluation for Ischemia, Graft Patency, Stent Patency and Post Hospital:ER on 10/15/12 for chest tightness and (-)enzymes History:  COPD and '98 CABG 01/13 MI-Heart Cath-Chronic occ SVG-RCA SVG-CFX Stent-SVG Diag No EF 4/13 MPS: EF: 25% (-) ischemia lg fixed defect  Transient CHB after stent requiring CPR, Anemia  Cardiac Risk Factors: Carotid Disease, CVA, Hypertension, IDDM Type 2 and PVD  Symptoms:  Chest Tightness    Nuclear Pre-Procedure Caffeine/Decaff Intake:  9:00pm NPO After: 11:00pm   Lungs:  clear O2 Sat: 95% on room air. IV 0.9% NS with Angio Cath:  22g  IV Site: L Hand  IV Started by:  Cathlyn Parsons, RN  Chest Size (in):  52-54 Cup Size: n/a  Height: 5\' 4"  (1.626 m)  Weight:  228 lb (103.42 kg)  BMI:  Body mass index is 39.12 kg/(m^2). Tech Comments:  Coreg taken this am    Nuclear Med Study 1 or 2 day study: 1 day  Stress Test Type:  Lexiscan  Reading MD: Cassell Clement, MD  Order Authorizing Provider:  Casimiro Needle Cooper,MD  Resting Radionuclide: Technetium 53m Sestamibi  Resting Radionuclide Dose: 11.0 mCi   Stress Radionuclide:  Technetium 56m Sestamibi  Stress Radionuclide Dose: 33.0 mCi           Stress Protocol Rest HR: 61 Stress HR: 69  Rest BP: 120/75 Stress BP: 128/63  Exercise Time (min): n/a METS: n/a   Predicted Max HR: 151 bpm % Max HR: 45.7 bpm Rate Pressure Product: 8832   Dose of Adenosine (mg):  n/a Dose of Lexiscan: 0.4 mg  Dose of Atropine (mg): n/a Dose of Dobutamine: n/a mcg/kg/min (at max HR)  Stress Test Technologist: Milana Na, EMT-P  Nuclear Technologist:  Domenic Polite, CNMT     Rest Procedure:  Myocardial perfusion imaging was  performed at rest 45 minutes following the intravenous administration of Technetium 46m Sestamibi. Rest ECG: NSR. RBBB. Old inferior and old lateral MI  Stress Procedure:  The patient received IV Lexiscan 0.4 mg over 15-seconds.  Technetium 24m Sestamibi injected at 30-seconds. This patient had sob, nausea, and rare pacs with Lexiscan. Quantitative spect images were obtained after a 45 minute delay. Stress ECG: No significant change from baseline ECG  QPS Raw Data Images:  Normal; no motion artifact; normal heart/lung ratio. Stress Images:  There is decreased uptake in the inferior wall and lateral wall Rest Images:  There is decreased uptake in the inferior wall and lateral wall Subtraction (SDS):  There is a fixed inferior defect that is most consistent with old inferior and lateral scar.  No significant ischemia. Transient Ischemic Dilatation (Normal <1.22):  1.07 Lung/Heart Ratio (Normal <0.45):  0.46  Quantitative Gated Spect Images QGS EDV:  218 ml QGS ESV:  145 ml  Impression Exercise Capacity:  Lexiscan with no exercise. BP Response:  Normal blood pressure response. Clinical Symptoms:  No chest pain. ECG Impression:  RBBB. Old inferior MI and old lateral MI. No change with Lexiscan stress Comparison with Prior Nuclear Study: No images to compare  Overall Impression:  High risk stress nuclear study. Extensive severe scar involving the entire inferior and lateral walls.  No significant reversible  ischemia.  LV Ejection Fraction: 34%.  LV Wall Motion:  Inferior wall akinesis and lateral wall hypokinesis.   Cassell Clement

## 2012-10-23 ENCOUNTER — Other Ambulatory Visit: Payer: Self-pay | Admitting: Cardiovascular Disease

## 2012-10-29 ENCOUNTER — Ambulatory Visit (INDEPENDENT_AMBULATORY_CARE_PROVIDER_SITE_OTHER): Payer: Medicare Other | Admitting: Cardiovascular Disease

## 2012-10-29 ENCOUNTER — Encounter: Payer: Self-pay | Admitting: Cardiovascular Disease

## 2012-10-29 VITALS — BP 114/66 | HR 63 | Ht 66.0 in | Wt 227.0 lb

## 2012-10-29 DIAGNOSIS — I5022 Chronic systolic (congestive) heart failure: Secondary | ICD-10-CM

## 2012-10-29 MED ORDER — NITROGLYCERIN 0.4 MG SL SUBL: 0.4000 mg | SUBLINGUAL_TABLET | SUBLINGUAL | Status: AC | PRN

## 2012-10-29 NOTE — Progress Notes (Signed)
HPI:  67 year old gentleman presenting for followup evaluation. He was recently hospitalized with chest pain. He has coronary artery disease status post CABG in PCI. 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 He uses a motorized scooter over 80% of the time. He was ruled out for myocardial infarction and discharged home. He had an echocardiogram demonstrating inferoposterior akinesis with an ejection fraction estimated at 45%. A followup nuclear perfusion scan was done and this demonstrated a large inferolateral scar without significant ischemia. LVEF by nuclear scan was calculated at 34%.  He remains chronically ill. He is limited to a motorized scooter. He short of breath without much activity. He denies recent chest pain. Edema has been controlled. No orthopnea or PND. He's trying to follow a better diet, but still likes ice cream.  Outpatient Encounter Prescriptions as of 10/29/2012  Medication Sig Dispense Refill  . albuterol (PROVENTIL HFA;VENTOLIN HFA) 108 (90 BASE) MCG/ACT inhaler Inhale 2 puffs into the lungs every 6 (six) hours as needed. For shortness of breath      . aspirin 325 MG EC tablet Take 81 mg by mouth daily. Patient's wife cuts into fourths, gives @ 81mg  daily.      [name] atorvastatin (LIPITOR) 40 MG tablet Take 1 tablet (40 mg total) by mouth daily.  30 tablet  11  . carvedilol (COREG) 6.25 MG tablet Take 6.25 mg by mouth 2 (two) times daily with a meal.      . clopidogrel (PLAVIX) 75 MG tablet Take 1 tablet (75 mg total) by mouth daily with breakfast.  90 tablet  3  . clotrimazole (LOTRIMIN) 1 % cream Apply 1 application topically daily as needed (for rash around mid section).      [name] co-enzyme Q-10 30 MG capsule Take 30 mg by mouth daily.      Tery Sanfilippo Sodium (STOOL SOFTENER) 100 MG capsule Take 100 mg by mouth daily as needed. constipation      . Fluticasone-Salmeterol  (ADVAIR DISKUS) 100-50 MCG/DOSE AEPB Inhale 1 puff into the lungs 2 (two) times daily.  1 each  11  . gabapentin (NEURONTIN) 600 MG tablet Take 1 tablet (600 mg total) by mouth 3 (three) times daily.  90 tablet  11  . insulin NPH (HUMULIN N,NOVOLIN N) 100 UNIT/ML injection Inject 70 Units into the skin daily before breakfast.      . isosorbide mononitrate (IMDUR) 30 MG 24 hr tablet Take 1 tablet (30 mg total) by mouth daily.  30 tablet  3  . losartan (COZAAR) 50 MG tablet Take 50 mg by mouth daily.      . pantoprazole (PROTONIX) 40 MG tablet Take 40 mg by mouth daily.      . potassium chloride SA (K-DUR,KLOR-CON) 20 MEQ tablet Take 1 tablet (20 mEq total) by mouth daily.  30 tablet  6  . silodosin (RAPAFLO) 8 MG CAPS capsule Take 8 mg by mouth daily with breakfast.      . spironolactone (ALDACTONE) 25 MG tablet Take 12.5 mg by mouth daily.      [name] tiotropium (SPIRIVA) 18 MCG inhalation capsule Place 1 capsule (18 mcg total) into inhaler and inhale daily.  30 capsule  6  . torsemide (DEMADEX) 20 MG tablet TAKE 2 TABLETS BY MOUTH EVERY MORNING AND 1 IN THE AFTERNOON AROUND 2 PM  90 tablet  0   No facility-administered encounter medications on file as of 10/29/2012.  Allergies  Allergen Reactions  . Sildenafil Nausea Only and Other (See Comments)    "took it once; heart went to pieces; never took it again"  . Percocet (Oxycodone-Acetaminophen) Other (See Comments)    Unknown reaction-possible altered mental state-per wife.    . Contrast Media (Iodinated Diagnostic Agents) Nausea Only    Past Medical History  Diagnosis Date  . CORONARY ARTERY DISEASE 03/11/2007    s/p CABG 1998;  admitted 08/2011 with an inferior STEMI.  LHC 09/07/11: Severe  Three-vessel CAD, LIMA-LAD patent, SVG-RCA chronically occluded, SVG-circumflex chronically occluded, SVG-diagonal 95%.  PCI: Promus DES to the SVG-diagonal.  Procedure c/b transient CHB req CPR and TTVP;  myoview 4/13:large severe inferolateral infarct, no  ischemia, EF 25%    . DIABETES MELLITUS, TYPE I 03/11/2007  . HYPERTENSION 03/11/2007  . 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 HYPERCHOLESTEROLEMIA 09/28/2008  . CERVICAL RADICULOPATHY, LEFT 09/23/2008  . PERIPHERAL NEUROPATHY 09/23/2008  . Chronic systolic heart failure 09/23/2008    Echocardiogram 09/07/11: Difficult study, inferior and posterior lateral wall hypokinesis, mild LVH, EF 45%, mild LAE.   [name] RENAL INSUFFICIENCY 03/15/2010  . ED (erectile dysfunction)   . Hypogonadism male   . GI bleed 2012    Multiple Duodenal ulcer   . Obesity   . Legally blind     bilaterally  . Stroke 78    age 19 months; residual right sided weakness  . History of blood transfusion 2012  . Charcot foot due to diabetes mellitus     chronic pain  . Osteoarthritis     right hip and shoulder  . COPD (chronic obstructive pulmonary disease)   . Myocardial infarction 2013b Jan  . PERIPHERAL VASCULAR INSUFFICIENCY,  LEGS, BILATERAL 08/17/2010    ABI's performed 04/19/11 R 0.72 L 0.71  . ANEMIA-IRON DEFICIENCY 09/23/2008    ulcer related last year  . Chronic edema     Chronic pedal edema    ROS: Negative except as per HPI  BP 114/66  Pulse 63  Ht 5\' 6"  (1.676 m)  Wt 102.967 kg (227 lb)  BMI 36.66 kg/m2  SpO2 96%  PHYSICAL EXAM: Pt is alert and oriented, pleasant, chronically ill appearing gentleman in NAD HEENT: normal Neck: JVP - normal, carotids 2+= with bilateral bruits Lungs: CTA bilaterally CV: RRR with grade 2/6 systolic ejection murmur at the left sternal border Abd: soft, NT, obese Ext: 1+ calf edema bilaterally Skin: Chronic skin changes at baseline  EKG:  Sinus rhythm with right bundle branch block, age indeterminate inferior MI.  Myoview scan: QPS  Raw Data Images: Normal; no motion artifact; normal heart/lung ratio.  Stress Images: There is decreased uptake in the inferior wall and lateral wall  Rest Images: There is decreased uptake in the inferior wall and lateral  wall  Subtraction (SDS): There is a fixed inferior defect that is most consistent with old inferior and lateral scar. No significant ischemia.  Transient Ischemic Dilatation (Normal <1.22): 1.07  Lung/Heart Ratio (Normal <0.45): 0.46  Quantitative Gated Spect Images  QGS EDV: 218 ml  QGS ESV: 145 ml  Impression  Exercise Capacity: Lexiscan with no exercise.  BP Response: Normal blood pressure response.  Clinical Symptoms: No chest pain.  ECG Impression: RBBB. Old inferior MI and old lateral MI. No change with Lexiscan stress  Comparison with Prior Nuclear Study: No images to compare  Overall Impression: High risk stress nuclear study. Extensive severe scar involving the entire inferior and lateral walls. No significant reversible ischemia.  LV Ejection Fraction: 34%. LV Wall Motion: Inferior wall akinesis and lateral wall hypokinesis.  2-D echocardiogram: Left ventricle: The cavity size was normal. Wall thickness was normal. Systolic function was mildly reduced. The estimated ejection fraction was in the range of 45% to 50%. Regional wall motion abnormalities: Severe hypokinesis of the inferior myocardium. Early diastolic septal annular tissue Doppler velocities Ea were abnormal.  ------------------------------------------------------------ Aortic valve: Structurally normal valve. Cusp separation was normal. Doppler: Transvalvular velocity was within the normal range. There was no stenosis. No regurgitation.  ------------------------------------------------------------ Aorta: Aortic root: The aortic root was normal in size. Ascending aorta: The ascending aorta was normal in size.  ------------------------------------------------------------ Mitral valve: The valve appears to be grossly normal. Doppler: There was no evidence for stenosis. Mild regurgitation. Peak gradient: 5mm Hg (D).  ------------------------------------------------------------ Left atrium: The atrium was  mildly to moderately dilated.  ------------------------------------------------------------ Right ventricle: The cavity size was normal. Systolic function was normal.  ------------------------------------------------------------ Pulmonic valve: The valve appears to be grossly normal. Doppler: Mild regurgitation.  ------------------------------------------------------------ Tricuspid valve: The valve appears to be grossly normal. Doppler: Trivial regurgitation.  ------------------------------------------------------------ Pulmonary artery: Systolic pressure was moderately to severely increased.  ------------------------------------------------------------ Right atrium: The atrium was normal in size.  ------------------------------------------------------------ Pericardium: There was no pericardial effusion.  ------------------------------------------------------------ Systemic veins: Inferior vena cava: The vessel was dilated; the respirophasic diameter changes were blunted (< 50%); findings are consistent with elevated central venous pressure.  ASSESSMENT AND PLAN: 1. Chronic systolic heart failure. Overall I think Mark French is stable. He is on a good medical program and we will continue the same. I see him back in 3 months. As always we discussed heart failure education, including blood pressure checks, dietary restrictions, and daily weights.  2. Coronary artery disease, native vessel. The patient stress Myoview scan showed fixed defects and no major areas of ischemia. Will continue his current medical program.  3. Hypertension. Blood pressure controlled on current medical program.  For followup I will see him back in 3 months.  Tonny Bollman 11/01/2012 10:06 PM

## 2012-10-29 NOTE — Patient Instructions (Addendum)
Your physician recommends that you schedule a follow-up appointment in: 3 months with Dr. Excell Seltzer.

## 2012-11-04 ENCOUNTER — Telehealth: Payer: Self-pay | Admitting: Endocrinology

## 2012-11-04 NOTE — Telephone Encounter (Signed)
Tried calling patient to schedule follow up appointment, no answer, left voice mail message on machine

## 2012-11-06 ENCOUNTER — Other Ambulatory Visit: Payer: Self-pay | Admitting: *Deleted

## 2012-11-06 MED ORDER — FLUTICASONE-SALMETEROL 100-50 MCG/DOSE IN AEPB: 1.0000 | INHALATION_SPRAY | Freq: Two times a day (BID) | RESPIRATORY_TRACT | Status: DC

## 2012-11-07 ENCOUNTER — Telehealth: Payer: Self-pay | Admitting: Endocrinology

## 2012-11-07 ENCOUNTER — Other Ambulatory Visit: Payer: Self-pay | Admitting: Endocrinology

## 2012-11-07 MED ORDER — OXYCODONE-ACETAMINOPHEN 10-325 MG PO TABS: 1.0000 | ORAL_TABLET | ORAL | Status: DC | PRN

## 2012-11-07 NOTE — Telephone Encounter (Signed)
The pt's wife called and is hoping to get an rx of hydrocodone called into the pharmacy - pt's callback number [phone]

## 2012-11-07 NOTE — Telephone Encounter (Signed)
Pt's wife left VM message. Pt is scheduled for a follow up on Tuesday of next week. His wife would like to see if patient can get a RF on Hydrocodone refilled. Pt is nearly out. Call back # [phone]

## 2012-11-07 NOTE — Telephone Encounter (Signed)
i printed 

## 2012-11-07 NOTE — Telephone Encounter (Signed)
Pt advised rx will be faxed

## 2012-11-08 ENCOUNTER — Telehealth: Payer: Self-pay | Admitting: Endocrinology

## 2012-11-08 MED ORDER — HYDROCODONE-ACETAMINOPHEN 10-325 MG PO TABS: 1.0000 | ORAL_TABLET | Freq: Four times a day (QID) | ORAL | Status: DC | PRN

## 2012-11-08 NOTE — Telephone Encounter (Signed)
The pharmacy called and stated the patient was there to pick up the medication.  The nurse stated the medication had been faxed to the pharmacy.  The pharmacy stated the rx was never received.  The pharmacy was told we would call back as soon as possible.

## 2012-11-08 NOTE — Telephone Encounter (Signed)
Left message rx has been faxed

## 2012-11-08 NOTE — Telephone Encounter (Signed)
i have printed Keep appt next week

## 2012-11-08 NOTE — Telephone Encounter (Signed)
Pt advised.

## 2012-11-08 NOTE — Telephone Encounter (Signed)
The patient called and is hoping to get a refill of hydrocodone.  She states she has left several messages with you, but was told to call the front desk back and request additional pain medication.

## 2012-11-12 ENCOUNTER — Ambulatory Visit (INDEPENDENT_AMBULATORY_CARE_PROVIDER_SITE_OTHER): Payer: Medicare Other | Admitting: Endocrinology

## 2012-11-12 VITALS — BP 132/80 | HR 78 | Wt 228.0 lb

## 2012-11-12 DIAGNOSIS — R202 Paresthesia of skin: Secondary | ICD-10-CM

## 2012-11-12 DIAGNOSIS — R209 Unspecified disturbances of skin sensation: Secondary | ICD-10-CM

## 2012-11-12 DIAGNOSIS — R200 Anesthesia of skin: Secondary | ICD-10-CM

## 2012-11-12 MED ORDER — FENTANYL 25 MCG/HR TD PT72: 1.0000 | MEDICATED_PATCH | TRANSDERMAL | Status: DC

## 2012-11-12 NOTE — Patient Instructions (Addendum)
Let's check a nerve-ending test at a neurologist's office.  you will receive a phone call, about a day and time for an appointment Here is a prescription for a pain patch.  You can still take the hydrocodone, but the idea of the pain patch is that you would need fewer of them. Please call in a few weeks to report progress.

## 2012-11-12 NOTE — Progress Notes (Signed)
Subjective:    Patient ID: Mark French, male    DOB: [birth year], 67 y.o.   MRN: [id number]  HPI Pt states few years of severe pain at both thighs, radiating to the feet.  It is unaffected by weight-bearing, but is also not worse at night.  He has assoc numbness. Past Medical History  Diagnosis Date  . CORONARY ARTERY DISEASE 03/11/2007    s/p CABG 1998;  admitted 08/2011 with an inferior STEMI.  LHC 09/07/11: Severe  Three-vessel CAD, LIMA-LAD patent, SVG-RCA chronically occluded, SVG-circumflex chronically occluded, SVG-diagonal 95%.  PCI: Promus DES to the SVG-diagonal.  Procedure c/b transient CHB req CPR and TTVP;  myoview 4/13:large severe inferolateral infarct, no ischemia, EF 25%    . DIABETES MELLITUS, TYPE I 03/11/2007  . HYPERTENSION 03/11/2007  . 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 HYPERCHOLESTEROLEMIA 09/28/2008  . CERVICAL RADICULOPATHY, LEFT 09/23/2008  . PERIPHERAL NEUROPATHY 09/23/2008  . Chronic systolic heart failure 09/23/2008    Echocardiogram 09/07/11: Difficult study, inferior and posterior lateral wall hypokinesis, mild LVH, EF 45%, mild LAE.   [name] RENAL INSUFFICIENCY 03/15/2010  . ED (erectile dysfunction)   . Hypogonadism male   . GI bleed 2012    Multiple Duodenal ulcer   . Obesity   . Legally blind     bilaterally  . Stroke 81    age 25 months; residual right sided weakness  . History of blood transfusion 2012  . Charcot foot due to diabetes mellitus     chronic pain  . Osteoarthritis     right hip and shoulder  . COPD (chronic obstructive pulmonary disease)   . Myocardial infarction 2013b Jan  . PERIPHERAL VASCULAR INSUFFICIENCY,  LEGS, BILATERAL 08/17/2010    ABI's performed 04/19/11 R 0.72 L 0.71  . ANEMIA-IRON DEFICIENCY 09/23/2008    ulcer related last year  . Chronic edema     Chronic pedal edema    Past Surgical History  Procedure Laterality Date  . Carpal tunnel release      left  . Sigmoidoscopy  03/11/2001  . Venous doppler   01/30/2004  . Tonsillectomy and adenoidectomy      "as a kid"  . Cataract extraction w/ intraocular lens  implant, bilateral    . Coronary angioplasty with stent placement  08/2011    "1"  . Retinopathy surgery  2000's    "laser; both eyes"  . Wrist fusion  1976    right  . Anterior fusion cervical spine  2010    "C spine; Dr. Yetta Barre"  . Ankle fracture surgery  1976; ?date    LEFT:  fused; removed bulk of hardware  . Heel spur surgery  ~ 2007    ? left  . Irrigation and debridement knee  02/27/2012    Procedure: IRRIGATION AND DEBRIDEMENT KNEE;  Surgeon: Verlee Rossetti, MD;  Location: [facility] OR;  Service: Orthopedics;  Laterality: Right;  irrigation and drainage right knee septic bursitis  . Eye surgery  2008    cataract removal, cautery  . Coronary artery bypass graft  1998    CABG X5  . Fracture surgery      foot broken 1976    History   Social History  . Marital Status: Married    Spouse Name: N/A    Number of Children: 0  . Years of Education: N/A   Occupational History  . Disabled    Social History Main Topics  . Smoking status: Never Smoker   . Smokeless tobacco:  Never Used  . Alcohol Use: No  . Drug Use: No  . Sexually Active: No   Other Topics Concern  . Not on file   Social History Narrative   Comes to appointments with wife      0 Caffeine drinks daily     Current Outpatient Prescriptions on File Prior to Visit  Medication Sig Dispense Refill  . albuterol (PROVENTIL HFA;VENTOLIN HFA) 108 (90 BASE) MCG/ACT inhaler Inhale 2 puffs into the lungs every 6 (six) hours as needed. For shortness of breath      . aspirin 325 MG EC tablet Take 81 mg by mouth daily. Patient's wife cuts into fourths, gives @ 81mg  daily.      [name] atorvastatin (LIPITOR) 40 MG tablet Take 1 tablet (40 mg total) by mouth daily.  30 tablet  11  . carvedilol (COREG) 6.25 MG tablet Take 6.25 mg by mouth 2 (two) times daily with a meal.      . clopidogrel (PLAVIX) 75 MG tablet Take 1 tablet  (75 mg total) by mouth daily with breakfast.  90 tablet  3  . clotrimazole (LOTRIMIN) 1 % cream Apply 1 application topically daily as needed (for rash around mid section).      [name] co-enzyme Q-10 30 MG capsule Take 30 mg by mouth daily.      Tery Sanfilippo Sodium (STOOL SOFTENER) 100 MG capsule Take 100 mg by mouth daily as needed. constipation      . Fluticasone-Salmeterol (ADVAIR DISKUS) 100-50 MCG/DOSE AEPB Inhale 1 puff into the lungs 2 (two) times daily.  1 each  11  . gabapentin (NEURONTIN) 600 MG tablet Take 1 tablet (600 mg total) by mouth 3 (three) times daily.  90 tablet  11  . HYDROcodone-acetaminophen (NORCO) 10-325 MG per tablet Take 1 tablet by mouth every 6 (six) hours as needed for pain.  120 tablet  0  . insulin NPH (HUMULIN N,NOVOLIN N) 100 UNIT/ML injection Inject 70 Units into the skin daily before breakfast.      . isosorbide mononitrate (IMDUR) 30 MG 24 hr tablet Take 1 tablet (30 mg total) by mouth daily.  30 tablet  3  . losartan (COZAAR) 50 MG tablet Take 50 mg by mouth daily.      . nitroGLYCERIN (NITROSTAT) 0.4 MG SL tablet Place 1 tablet (0.4 mg total) under the tongue every 5 (five) minutes as needed for chest pain.  25 tablet  5  . pantoprazole (PROTONIX) 40 MG tablet Take 40 mg by mouth daily.      . potassium chloride SA (K-DUR,KLOR-CON) 20 MEQ tablet Take 1 tablet (20 mEq total) by mouth daily.  30 tablet  6  . silodosin (RAPAFLO) 8 MG CAPS capsule Take 8 mg by mouth daily with breakfast.      . spironolactone (ALDACTONE) 25 MG tablet Take 12.5 mg by mouth daily.      [name] tiotropium (SPIRIVA) 18 MCG inhalation capsule Place 1 capsule (18 mcg total) into inhaler and inhale daily.  30 capsule  6  . torsemide (DEMADEX) 20 MG tablet TAKE 2 TABLETS BY MOUTH EVERY MORNING AND 1 IN THE AFTERNOON AROUND 2 PM  90 tablet  0   No current facility-administered medications on file prior to visit.    Allergies  Allergen Reactions  . Sildenafil Nausea Only and Other (See Comments)     "took it once; heart went to pieces; never took it again"  . Percocet (Oxycodone-Acetaminophen) Other (See Comments)  Unknown reaction-possible altered mental state-per wife.    . Contrast Media (Iodinated Diagnostic Agents) Nausea Only    Family History  Problem Relation Age of Onset  . Heart disease Mother     Coronary Artery Disease  . Heart disease Father     Coronary Artery Disease  . Heart disease Paternal Grandmother     Coronary Artery Disease  . Colon cancer Neg Hx   . Diabetes Other     Grandmother    BP 132/80  Pulse 78  Wt 228 lb (103.42 kg)  BMI 36.82 kg/m2  SpO2 97%   Review of Systems He also has chronic pain at the right hip, which is what keeps him confined to the wheelchair.  Denies rash.     Objective:   Physical Exam VITAL SIGNS:  See vs page GENERAL: no distress.  In power chair. Neuro: sensation is intact to touch on the feet, but decreased from normal.  Motor is 5/5.       Assessment & Plan:  Hip pain.  This contributes to overall pain syndrome Peripheral sensory neuropathy.  This is the major component of the pain syndrome. CVA: this and other health probs contribute to gait difficulty

## 2012-11-15 ENCOUNTER — Telehealth: Payer: Self-pay | Admitting: Endocrinology

## 2012-11-15 NOTE — Telephone Encounter (Signed)
[facility], [phone] - has reached out to patient following hospital visits to offer in home care. Patient's wife is reluctant. They just wanted to let [name] know this. They will be glad to try again if [name] feels their services could assist the patient / Sherri S.

## 2012-11-19 ENCOUNTER — Other Ambulatory Visit: Payer: Self-pay | Admitting: *Deleted

## 2012-11-19 DIAGNOSIS — R200 Anesthesia of skin: Secondary | ICD-10-CM

## 2012-11-20 ENCOUNTER — Other Ambulatory Visit: Payer: Self-pay | Admitting: *Deleted

## 2012-11-20 DIAGNOSIS — J449 Chronic obstructive pulmonary disease, unspecified: Secondary | ICD-10-CM

## 2012-11-20 DIAGNOSIS — I2510 Atherosclerotic heart disease of native coronary artery without angina pectoris: Secondary | ICD-10-CM

## 2012-11-20 DIAGNOSIS — J45909 Unspecified asthma, uncomplicated: Secondary | ICD-10-CM

## 2012-11-20 MED ORDER — TORSEMIDE 20 MG PO TABS: ORAL_TABLET | ORAL | Status: DC

## 2012-11-20 MED ORDER — SPIRONOLACTONE 25 MG PO TABS: 12.5000 mg | ORAL_TABLET | Freq: Every day | ORAL | Status: DC

## 2012-11-20 MED ORDER — GABAPENTIN 600 MG PO TABS: 600.0000 mg | ORAL_TABLET | Freq: Three times a day (TID) | ORAL | Status: DC

## 2012-11-20 MED ORDER — ISOSORBIDE MONONITRATE ER 30 MG PO TB24: 30.0000 mg | ORAL_TABLET | Freq: Every day | ORAL | Status: DC

## 2012-11-26 ENCOUNTER — Telehealth: Payer: Self-pay | Admitting: *Deleted

## 2012-11-26 NOTE — Telephone Encounter (Signed)
I called and spoke to wife and Mark French and there question how much test cost.  He stated that [name] wanting to change him to fentanyl patch, and he had this testing 10-12 yrs ago and I stated that things can change over time re" damage.  I would relay message to Angie in billing.  [facility] MCR complete.

## 2012-11-27 ENCOUNTER — Telehealth: Payer: Self-pay | Admitting: Endocrinology

## 2012-11-27 MED ORDER — HYDROCODONE-ACETAMINOPHEN 10-325 MG PO TABS: 1.0000 | ORAL_TABLET | Freq: Four times a day (QID) | ORAL | Status: DC | PRN

## 2012-11-27 NOTE — Telephone Encounter (Signed)
This is a general warning about narcotics.

## 2012-11-27 NOTE — Telephone Encounter (Signed)
Call wife regarding meds, Fentanyl patches - patient is uneasy using the patch due to warnings on the packaging regarding breathing problems. Will also be running out of Hydrocodone. CB# [phone] / Sherri S.

## 2012-11-27 NOTE — Telephone Encounter (Signed)
Please see message below

## 2012-11-27 NOTE — Telephone Encounter (Signed)
Pt's wife advised.

## 2012-11-28 ENCOUNTER — Encounter (INDEPENDENT_AMBULATORY_CARE_PROVIDER_SITE_OTHER): Payer: Medicare Other

## 2012-11-28 ENCOUNTER — Telehealth: Payer: Self-pay | Admitting: *Deleted

## 2012-11-28 ENCOUNTER — Ambulatory Visit (INDEPENDENT_AMBULATORY_CARE_PROVIDER_SITE_OTHER): Payer: Medicare Other | Admitting: Neurology

## 2012-11-28 DIAGNOSIS — E1142 Type 2 diabetes mellitus with diabetic polyneuropathy: Secondary | ICD-10-CM

## 2012-11-28 DIAGNOSIS — Z0289 Encounter for other administrative examinations: Secondary | ICD-10-CM

## 2012-11-28 NOTE — Telephone Encounter (Signed)
[name], nurse with [facility] called stating that the patients wife never gave the pt the fentyl patches due to the side effects risk with people with breathing problems. Because of this pt has continued to take the hydrocodone every 4 hrs instead of every 6 hrs as prescribed? They are requesting a new rx for the hydrocodone for every 4 hrs because he is out of pain medication. Please advise.

## 2012-11-28 NOTE — Procedures (Signed)
  HISTORY:  Mark French is a 67 year old gentleman with a history of diabetes dating back 6 or 7 years. The patient has a several year history of numbness and discomfort in the feet, with discomfort and sensory alteration up to the knees bilaterally. The patient has gait instability, and he has had a recent fall. The patient is being evaluated for a possible peripheral neuropathy.  NERVE CONDUCTION STUDIES:  Nerve conduction studies were performed on both lower extremities and on the right upper extremity. The distal motor latency for the right median nerve was prolonged, with a normal motor amplitude. The F wave latency for the median nerves is borderline normal, with a normal nerve conduction velocity. The right median sensory latency is prolonged. The study of the right ulnar nerve revealed no response.  Nerve conduction studies done on both lower extremities involved studies of the peroneal and posterior tibial nerves bilaterally. These studies revealed no response for both legs. The peroneal sensory latencies were absent bilaterally, and the H reflex latencies were absent bilaterally.  EMG STUDIES:  EMG study was performed on the right lower extremity:  The tibialis anterior muscle reveals 2 to 5K motor units with decreased recruitment. 2 plus fibrillations and positive waves were seen. The peroneus tertius muscle reveals 2 to 5K motor units with decreased recruitment. 2 plus fibrillations and positive waves were seen. The medial gastrocnemius muscle reveals 1 to 3K motor units with full recruitment. 1 plus fibrillations and positive waves were seen. The vastus lateralis muscle reveals 2 to 4K motor units with full recruitment. No fibrillations or positive waves were seen. The iliopsoas muscle reveals 2 to 4K motor units with full recruitment. No fibrillations or positive waves were seen. The biceps femoris muscle (long head) reveals 2 to 4K motor units with full recruitment. No  fibrillations or positive waves were seen. The lumbosacral paraspinal muscles were tested at 3 levels, and revealed no abnormalities of insertional activity at all 3 levels tested. There was good relaxation.    IMPRESSION:  Nerve conduction studies done on both lower extremities and on the right upper extremity suggests the presence of a severe primarily axonal peripheral neuropathy. There is an overlying mild right carpal tunnel syndrome, and a severe right ulnar neuropathy. EMG evaluation of the right lower extremity shows distal acute and chronic denervation consistent with the diagnosis of peripheral neuropathy, but the EMG study suggests that the peripheral neuropathy is not as severe as the nerve conduction study would suggest. The patient likely has a peripheral neuropathy of moderate severity. There is no evidence of an overlying right lumbosacral radiculopathy.  Marlan Palau MD 11/28/2012 10:50 AM  [facility] [address] [place], [place] [address]  Phone [phone] Fax [phone]

## 2012-11-29 ENCOUNTER — Telehealth: Payer: Self-pay | Admitting: Endocrinology

## 2012-11-29 NOTE — Telephone Encounter (Signed)
The prescription has been written, and won't be changed.   Ov within the next 2 weeks

## 2012-11-29 NOTE — Telephone Encounter (Signed)
Left message for nurse [name] at [facility] [phone] ext. [phone]

## 2012-11-29 NOTE — Telephone Encounter (Signed)
Please call wife regarding hydrocodone script / Sherri S.

## 2012-11-29 NOTE — Telephone Encounter (Signed)
It was refilled 2 days ago

## 2012-11-29 NOTE — Telephone Encounter (Signed)
Please call pt's wife and make pt an appointment for next week

## 2012-11-29 NOTE — Telephone Encounter (Signed)
Lauren with [facility] would like you to change rx to every q4h  [phone] ext. [phone]

## 2012-12-01 ENCOUNTER — Encounter (HOSPITAL_COMMUNITY): Payer: Self-pay | Admitting: *Deleted

## 2012-12-01 ENCOUNTER — Emergency Department (HOSPITAL_COMMUNITY)
Admission: EM | Admit: 2012-12-01 | Discharge: 2012-12-01 | Disposition: A | Discharge status: Home / Self Care | Payer: Medicare Other | Attending: Emergency Medicine | Admitting: Emergency Medicine

## 2012-12-01 DIAGNOSIS — I509 Heart failure, unspecified: Secondary | ICD-10-CM | POA: Insufficient documentation

## 2012-12-01 DIAGNOSIS — I10 Essential (primary) hypertension: Secondary | ICD-10-CM | POA: Insufficient documentation

## 2012-12-01 DIAGNOSIS — Z760 Encounter for issue of repeat prescription: Secondary | ICD-10-CM

## 2012-12-01 DIAGNOSIS — J449 Chronic obstructive pulmonary disease, unspecified: Secondary | ICD-10-CM | POA: Insufficient documentation

## 2012-12-01 DIAGNOSIS — I252 Old myocardial infarction: Secondary | ICD-10-CM | POA: Insufficient documentation

## 2012-12-01 DIAGNOSIS — F1920 Other psychoactive substance dependence, uncomplicated: Secondary | ICD-10-CM

## 2012-12-01 DIAGNOSIS — Z79899 Other long term (current) drug therapy: Secondary | ICD-10-CM | POA: Insufficient documentation

## 2012-12-01 DIAGNOSIS — Z7982 Long term (current) use of aspirin: Secondary | ICD-10-CM | POA: Insufficient documentation

## 2012-12-01 DIAGNOSIS — N189 Chronic kidney disease, unspecified: Secondary | ICD-10-CM | POA: Insufficient documentation

## 2012-12-01 DIAGNOSIS — Z8673 Personal history of transient ischemic attack (TIA), and cerebral infarction without residual deficits: Secondary | ICD-10-CM | POA: Insufficient documentation

## 2012-12-01 DIAGNOSIS — J4489 Other specified chronic obstructive pulmonary disease: Secondary | ICD-10-CM | POA: Insufficient documentation

## 2012-12-01 DIAGNOSIS — E1049 Type 1 diabetes mellitus with other diabetic neurological complication: Secondary | ICD-10-CM | POA: Insufficient documentation

## 2012-12-01 DIAGNOSIS — E669 Obesity, unspecified: Secondary | ICD-10-CM | POA: Insufficient documentation

## 2012-12-01 DIAGNOSIS — I2510 Atherosclerotic heart disease of native coronary artery without angina pectoris: Secondary | ICD-10-CM | POA: Insufficient documentation

## 2012-12-01 DIAGNOSIS — E11359 Type 2 diabetes mellitus with proliferative diabetic retinopathy without macular edema: Secondary | ICD-10-CM | POA: Insufficient documentation

## 2012-12-01 MED ORDER — HYDROCODONE-ACETAMINOPHEN 10-325 MG PO TABS
1.0000 | ORAL_TABLET | Freq: Once | ORAL | Status: AC
  Administered 2012-12-01: 1 via ORAL
  Filled 2012-12-01: qty 1

## 2012-12-01 MED ORDER — HYDROCODONE-ACETAMINOPHEN 10-325 MG PO TABS: 1.0000 | ORAL_TABLET | Freq: Four times a day (QID) | ORAL | Status: DC | PRN

## 2012-12-01 NOTE — ED Provider Notes (Signed)
Medical screening examination/treatment/procedure(s) were performed by non-physician practitioner and as supervising physician I was immediately available for consultation/collaboration.  Sunnie Nielsen, MD 12/01/12 [phone]

## 2012-12-01 NOTE — ED Provider Notes (Signed)
History     CSN: [id number]  Arrival date & time 12/01/12  0001   First MD Initiated Contact with Patient 12/01/12 0109      Chief Complaint  Patient presents with  . Medication Refill    (Consider location/radiation/quality/duration/timing/severity/associated sxs/prior treatment) HPI Comments: Patient on chronic narcotic pain control for neuropathy due to diabetes .  Presents tonight stating, that he has run out of his monthly prescription stating, that he previously has been taking 1 10 mg, Vicodin every 4 hours.  His last prescription stated that he was to take one tablet every 6 hours, but he couldn't comply with that so now.  He is out of his medication.  One week prior to his regular.  Refill medications.  The history is provided by the patient.    Past Medical History  Diagnosis Date  . CORONARY ARTERY DISEASE 03/11/2007    s/p CABG 1998;  admitted 08/2011 with an inferior STEMI.  LHC 09/07/11: Severe  Three-vessel CAD, LIMA-LAD patent, SVG-RCA chronically occluded, SVG-circumflex chronically occluded, SVG-diagonal 95%.  PCI: Promus DES to the SVG-diagonal.  Procedure c/b transient CHB req CPR and TTVP;  myoview 4/13:large severe inferolateral infarct, no ischemia, EF 25%    . DIABETES MELLITUS, TYPE I 03/11/2007  . HYPERTENSION 03/11/2007  . 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 HYPERCHOLESTEROLEMIA 09/28/2008  . CERVICAL RADICULOPATHY, LEFT 09/23/2008  . PERIPHERAL NEUROPATHY 09/23/2008  . Chronic systolic heart failure 09/23/2008    Echocardiogram 09/07/11: Difficult study, inferior and posterior lateral wall hypokinesis, mild LVH, EF 45%, mild LAE.   [name] RENAL INSUFFICIENCY 03/15/2010  . ED (erectile dysfunction)   . Hypogonadism male   . GI bleed 2012    Multiple Duodenal ulcer   . Obesity   . Legally blind     bilaterally  . Stroke 6    age 52 months; residual right sided weakness  . History of blood transfusion 2012  . Charcot foot due to diabetes mellitus      chronic pain  . Osteoarthritis     right hip and shoulder  . COPD (chronic obstructive pulmonary disease)   . Myocardial infarction 2013b Jan  . PERIPHERAL VASCULAR INSUFFICIENCY,  LEGS, BILATERAL 08/17/2010    ABI's performed 04/19/11 R 0.72 L 0.71  . ANEMIA-IRON DEFICIENCY 09/23/2008    ulcer related last year  . Chronic edema     Chronic pedal edema    Past Surgical History  Procedure Laterality Date  . Carpal tunnel release      left  . Sigmoidoscopy  03/11/2001  . Venous doppler  01/30/2004  . Tonsillectomy and adenoidectomy      "as a kid"  . Cataract extraction w/ intraocular lens  implant, bilateral    . Coronary angioplasty with stent placement  08/2011    "1"  . Retinopathy surgery  2000's    "laser; both eyes"  . Wrist fusion  1976    right  . Anterior fusion cervical spine  2010    "C spine; Dr. Yetta Barre"  . Ankle fracture surgery  1976; ?date    LEFT:  fused; removed bulk of hardware  . Heel spur surgery  ~ 2007    ? left  . Irrigation and debridement knee  02/27/2012    Procedure: IRRIGATION AND DEBRIDEMENT KNEE;  Surgeon: Verlee Rossetti, MD;  Location: [facility] OR;  Service: Orthopedics;  Laterality: Right;  irrigation and drainage right knee septic bursitis  . Eye surgery  2008    cataract  removal, cautery  . Coronary artery bypass graft  1998    CABG X5  . Fracture surgery      foot broken 1976    Family History  Problem Relation Age of Onset  . Heart disease Mother     Coronary Artery Disease  . Heart disease Father     Coronary Artery Disease  . Heart disease Paternal Grandmother     Coronary Artery Disease  . Colon cancer Neg Hx   . Diabetes Other     Grandmother    History  Substance Use Topics  . Smoking status: Never Smoker   . Smokeless tobacco: Never Used  . Alcohol Use: No      Review of Systems  Constitutional: Negative for fever and chills.  Respiratory: Negative for shortness of breath.   Cardiovascular: Negative for leg  swelling.  Gastrointestinal: Negative for nausea and vomiting.  Musculoskeletal: Positive for arthralgias.  Skin: Negative for color change.  Neurological: Negative for dizziness, weakness and numbness.  All other systems reviewed and are negative.    Allergies  Sildenafil; Percocet; and Contrast media  Home Medications   Current Outpatient Rx  Name  Route  Sig  Dispense  Refill  . aspirin 325 MG EC tablet   Oral   Take 81 mg by mouth daily. Patient's wife cuts into fourths, gives @ 81mg  daily.         [name] atorvastatin (LIPITOR) 40 MG tablet   Oral   Take 1 tablet (40 mg total) by mouth daily.   30 tablet   11   . carvedilol (COREG) 6.25 MG tablet   Oral   Take 6.25 mg by mouth 2 (two) times daily with a meal.         . clopidogrel (PLAVIX) 75 MG tablet   Oral   Take 1 tablet (75 mg total) by mouth daily with breakfast.   90 tablet   3   . clotrimazole (LOTRIMIN) 1 % cream   Topical   Apply 1 application topically daily as needed (for rash around mid section).         [name] co-enzyme Q-10 30 MG capsule   Oral   Take 30 mg by mouth daily.         Tery Sanfilippo Sodium (STOOL SOFTENER) 100 MG capsule   Oral   Take 100 mg by mouth daily as needed. constipation         . Fluticasone-Salmeterol (ADVAIR DISKUS) 100-50 MCG/DOSE AEPB   Inhalation   Inhale 1 puff into the lungs 2 (two) times daily.   1 each   11   . gabapentin (NEURONTIN) 600 MG tablet   Oral   Take 1 tablet (600 mg total) by mouth 3 (three) times daily.   90 tablet   4   . insulin NPH (HUMULIN N,NOVOLIN N) 100 UNIT/ML injection   Subcutaneous   Inject 70 Units into the skin daily before breakfast.         . isosorbide mononitrate (IMDUR) 30 MG 24 hr tablet   Oral   Take 1 tablet (30 mg total) by mouth daily.   30 tablet   6   . losartan (COZAAR) 50 MG tablet   Oral   Take 50 mg by mouth daily.         . pantoprazole (PROTONIX) 40 MG tablet   Oral   Take 40 mg by mouth daily.          . potassium  chloride SA (K-DUR,KLOR-CON) 20 MEQ tablet   Oral   Take 1 tablet (20 mEq total) by mouth daily.   30 tablet   6   . silodosin (RAPAFLO) 8 MG CAPS capsule   Oral   Take 8 mg by mouth daily with breakfast.         . spironolactone (ALDACTONE) 25 MG tablet   Oral   Take 0.5 tablets (12.5 mg total) by mouth daily.   30 tablet   6     pls disregard 45 day supply. Thanks [place] CMA   . tiotropium (SPIRIVA) 18 MCG inhalation capsule   Inhalation   Place 1 capsule (18 mcg total) into inhaler and inhale daily.   30 capsule   6   . torsemide (DEMADEX) 20 MG tablet      Take 2 tablet by mouth in the morning and 1 tablet in the afternoon around 2pm.   90 tablet   3   . albuterol (PROVENTIL HFA;VENTOLIN HFA) 108 (90 BASE) MCG/ACT inhaler   Inhalation   Inhale 2 puffs into the lungs every 6 (six) hours as needed. For shortness of breath         . fentaNYL (DURAGESIC - DOSED MCG/HR) 25 MCG/HR   Transdermal   Place 1 patch (25 mcg total) onto the skin every 3 (three) days.   10 patch   0   . HYDROcodone-acetaminophen (NORCO) 10-325 MG per tablet   Oral   Take 1 tablet by mouth every 6 (six) hours as needed for pain.   6 tablet   0   . nitroGLYCERIN (NITROSTAT) 0.4 MG SL tablet   Sublingual   Place 1 tablet (0.4 mg total) under the tongue every 5 (five) minutes as needed for chest pain.   25 tablet   5     BP 128/54  Pulse 62  Temp(Src) 97.9 F (36.6 C) (Oral)  Resp 14  SpO2 99%  Physical Exam  Nursing note and vitals reviewed. Constitutional: He is oriented to person, place, and time. He appears well-developed and well-nourished.  HENT:  Head: Normocephalic.  Eyes: Pupils are equal, round, and reactive to light.  Neck: Normal range of motion.  Cardiovascular: Normal rate and regular rhythm.   Pulmonary/Chest: Effort normal and breath sounds normal.  Musculoskeletal: Normal range of motion.  Neurological: He is alert and oriented to  person, place, and time.  Skin: Skin is warm. No rash noted.    ED Course  Procedures (including critical care time)  Labs Reviewed  GLUCOSE, CAPILLARY - Abnormal; Notable for the following:    Glucose-Capillary 140 (*)    All other components within normal limits   No results found.   1. Medication refill   2. Chemical dependency       MDM  Per the national, [address], Sales executive.  Patient had a prescription filled for 11/08/12 for 120 tablets of 10 mg Vicodin Ago.  Prescribed, one 10 mg, Vicodin every 6 hours for tomorrow to bridge him until  he can see his physician on Monday         Arman Filter, NP 12/01/12 0144

## 2012-12-01 NOTE — ED Notes (Signed)
Pt states he has had pain since yesterday when he ran out of his hydrocodone 10-325 yesterday,  And he hurts from his toes up his leg,  He would like a medication refill

## 2012-12-01 NOTE — ED Notes (Signed)
Patient seen by Dr Everardo All for chronic pain. Patient reports he is in pain over his entire body "it hurts from the tip of my toes all the way up" and motions to his head. Patient states he has ran out of his pain medications. Patient was prescribed a "pain patch" and 10/325 hydrocodone. Patient states he did not use the pain patch because "it sounds to dangerous". Patient denies being under a pain contract.

## 2012-12-02 ENCOUNTER — Telehealth: Payer: Self-pay | Admitting: Endocrinology

## 2012-12-02 NOTE — Telephone Encounter (Signed)
We can address what to do about the fentanyl, but we can't replace the prescription.

## 2012-12-02 NOTE — Telephone Encounter (Signed)
Pt's wife advised hyrdocodone will not be refilled, pt declines Fetanyl patch at this time because of the warning and side effects

## 2012-12-02 NOTE — Telephone Encounter (Signed)
Wife called to get appt today. Says husband is out of Hydrocodone. Going through withdrawals. Went to ER over the w/e and was given 6. She wants to see if he can be worked in today or given a few more pills to get him through until we can get him in for an appt / Sherri

## 2012-12-02 NOTE — Telephone Encounter (Signed)
Chart says this was refilled on 11/27/12, and cannot be replaced.

## 2012-12-02 NOTE — Telephone Encounter (Signed)
Pt states he would like to come in for an appt so that he can get med refilled, he states he is completely out and and did not get it refilled on 11/27/12, pt states he needs a refill before 12/06/12

## 2012-12-10 ENCOUNTER — Other Ambulatory Visit: Payer: Self-pay | Admitting: Cardiovascular Disease

## 2012-12-16 ENCOUNTER — Other Ambulatory Visit: Payer: Self-pay | Admitting: Endocrinology

## 2012-12-24 ENCOUNTER — Other Ambulatory Visit: Payer: Self-pay | Admitting: Endocrinology

## 2013-01-01 ENCOUNTER — Telehealth: Payer: Self-pay

## 2013-01-01 NOTE — Telephone Encounter (Signed)
Pt's wife advised.

## 2013-01-01 NOTE — Telephone Encounter (Signed)
Pt's wife would like to know if it is ok if patient uses fentanly patch along with hydrocodone [phone]

## 2013-01-01 NOTE — Telephone Encounter (Signed)
Yes, the fentanyl is for a steady stream of medication, and you can still take the hydrocodone as needed.

## 2013-01-06 ENCOUNTER — Telehealth: Payer: Self-pay | Admitting: Cardiovascular Disease

## 2013-01-06 NOTE — Telephone Encounter (Signed)
New Prob      Pt has gained 5 lbs in 2 days and having trouble urinating. Would like to speak to nurse.

## 2013-01-06 NOTE — Telephone Encounter (Signed)
I spoke with the pt and his wife about pt's symptoms. The pt does not weigh on a daily basis but has noticed over the past 2 days he has gained 5 lbs. The pt denies any worsening SOB or swelling.  The pt normally takes Demadex 20mg  two in the morning and one in the afternoon. The pt already took 2 tablets this morning and just took an extra tablet before I called him.  I instructed the pt that he needs to still take his normal dose of Demadex this afternoon. I also instructed the pt that he needs to weigh on a daily basis and limit salt in his diet.  I will forward this message to Dr Excell Seltzer for further recommendations.

## 2013-01-07 NOTE — Telephone Encounter (Signed)
Would continue same meds, check daily weights as suggested. If weight climbs he will need a PA/NP visit within next few days.

## 2013-01-07 NOTE — Telephone Encounter (Signed)
I spoke with the pt's wife and she states the pt is doing well today.  The pt did not weigh today as he was instructed.  I made her aware of Dr Earmon Phoenix comments and reinforced the importance of daily weights.  She will contact our office if the pt develops further issues.

## 2013-01-17 ENCOUNTER — Encounter: Payer: Self-pay | Admitting: Endocrinology

## 2013-01-17 ENCOUNTER — Ambulatory Visit (INDEPENDENT_AMBULATORY_CARE_PROVIDER_SITE_OTHER): Payer: Medicare Other | Admitting: Endocrinology

## 2013-01-17 VITALS — BP 122/64 | HR 60 | Temp 98.1°F | Resp 16 | Wt 240.0 lb

## 2013-01-17 DIAGNOSIS — E1049 Type 1 diabetes mellitus with other diabetic neurological complication: Secondary | ICD-10-CM

## 2013-01-17 MED ORDER — FENTANYL 25 MCG/HR TD PT72: 1.0000 | MEDICATED_PATCH | TRANSDERMAL | Status: DC

## 2013-01-17 MED ORDER — HYDROCODONE-ACETAMINOPHEN 10-325 MG PO TABS: 1.0000 | ORAL_TABLET | Freq: Four times a day (QID) | ORAL | Status: DC | PRN

## 2013-01-17 NOTE — Progress Notes (Signed)
Subjective:    Patient ID: Mark French, male    DOB: 1946-07-23, 67 y.o.   MRN: [id number]  HPI Pt returns for f/u of insulin-requiring DM (dx'ed 1995; complicated by CAD, renal insufficiency, peripheral sensory neuropathy and retinopathy; therapy has been limited by pt's need for a simple, inexpensive insulin regimen).  no cbg record, but states cbg's are well-controlled. Pt states few years of severe pain at both thighs, radiating to the feet.  It is unaffected by weight-bearing, but is also not worse at night.  He has assoc numbness. He also reports a new open skin ulcer on the thigh. Past Medical History  Diagnosis Date  . CORONARY ARTERY DISEASE 03/11/2007    s/p CABG 1998;  admitted 08/2011 with an inferior STEMI.  LHC 09/07/11: Severe  Three-vessel CAD, LIMA-LAD patent, SVG-RCA chronically occluded, SVG-circumflex chronically occluded, SVG-diagonal 95%.  PCI: Promus DES to the SVG-diagonal.  Procedure c/b transient CHB req CPR and TTVP;  myoview 4/13:large severe inferolateral infarct, no ischemia, EF 25%    . DIABETES MELLITUS, TYPE I 03/11/2007  . HYPERTENSION 03/11/2007  . 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 HYPERCHOLESTEROLEMIA 09/28/2008  . CERVICAL RADICULOPATHY, LEFT 09/23/2008  . PERIPHERAL NEUROPATHY 09/23/2008  . Chronic systolic heart failure 09/23/2008    Echocardiogram 09/07/11: Difficult study, inferior and posterior lateral wall hypokinesis, mild LVH, EF 45%, mild LAE.   [name] RENAL INSUFFICIENCY 03/15/2010  . ED (erectile dysfunction)   . Hypogonadism male   . GI bleed 2012    Multiple Duodenal ulcer   . Obesity   . Legally blind     bilaterally  . Stroke 25    age 48 months; residual right sided weakness  . History of blood transfusion 2012  . Charcot foot due to diabetes mellitus     chronic pain  . Osteoarthritis     right hip and shoulder  . COPD (chronic obstructive pulmonary disease)   . Myocardial infarction 2013b Jan  . PERIPHERAL VASCULAR  INSUFFICIENCY,  LEGS, BILATERAL 08/17/2010    ABI's performed 04/19/11 R 0.72 L 0.71  . ANEMIA-IRON DEFICIENCY 09/23/2008    ulcer related last year  . Chronic edema     Chronic pedal edema    Past Surgical History  Procedure Laterality Date  . Carpal tunnel release      left  . Sigmoidoscopy  03/11/2001  . Venous doppler  01/30/2004  . Tonsillectomy and adenoidectomy      "as a kid"  . Cataract extraction w/ intraocular lens  implant, bilateral    . Coronary angioplasty with stent placement  08/2011    "1"  . Retinopathy surgery  2000's    "laser; both eyes"  . Wrist fusion  1976    right  . Anterior fusion cervical spine  2010    "C spine; Dr. Yetta Barre"  . Ankle fracture surgery  1976; ?date    LEFT:  fused; removed bulk of hardware  . Heel spur surgery  ~ 2007    ? left  . Irrigation and debridement knee  02/27/2012    Procedure: IRRIGATION AND DEBRIDEMENT KNEE;  Surgeon: Verlee Rossetti, MD;  Location: [facility] OR;  Service: Orthopedics;  Laterality: Right;  irrigation and drainage right knee septic bursitis  . Eye surgery  2008    cataract removal, cautery  . Coronary artery bypass graft  1998    CABG X5  . Fracture surgery      foot broken 1976    History  Social History  . Marital Status: Married    Spouse Name: N/A    Number of Children: 0  . Years of Education: N/A   Occupational History  . Disabled    Social History Main Topics  . Smoking status: Never Smoker   . Smokeless tobacco: Never Used  . Alcohol Use: No  . Drug Use: No  . Sexually Active: No   Other Topics Concern  . Not on file   Social History Narrative   Comes to appointments with wife      0 Caffeine drinks daily     Current Outpatient Prescriptions on File Prior to Visit  Medication Sig Dispense Refill  . ADVAIR DISKUS 100-50 MCG/DOSE AEPB INHALE 1 PUFF BY MOUTH TWICE DAILY  1 each  3  . albuterol (PROVENTIL HFA;VENTOLIN HFA) 108 (90 BASE) MCG/ACT inhaler Inhale 2 puffs into the lungs  every 6 (six) hours as needed. For shortness of breath      . aspirin 325 MG EC tablet Take 81 mg by mouth daily. Patient's wife cuts into fourths, gives @ 81mg  daily.      [name] atorvastatin (LIPITOR) 40 MG tablet Take 1 tablet (40 mg total) by mouth daily.  30 tablet  11  . carvedilol (COREG) 6.25 MG tablet Take 6.25 mg by mouth 2 (two) times daily with a meal.      . clopidogrel (PLAVIX) 75 MG tablet Take 1 tablet (75 mg total) by mouth daily with breakfast.  90 tablet  3  . clotrimazole (LOTRIMIN) 1 % cream Apply 1 application topically daily as needed (for rash around mid section).      [name] co-enzyme Q-10 30 MG capsule Take 30 mg by mouth daily.      Tery Sanfilippo Sodium (STOOL SOFTENER) 100 MG capsule Take 100 mg by mouth daily as needed. constipation      . Fluticasone-Salmeterol (ADVAIR DISKUS) 100-50 MCG/DOSE AEPB Inhale 1 puff into the lungs 2 (two) times daily.  1 each  11  . gabapentin (NEURONTIN) 600 MG tablet Take 1 tablet (600 mg total) by mouth 3 (three) times daily.  90 tablet  4  . insulin NPH (HUMULIN N,NOVOLIN N) 100 UNIT/ML injection Inject 70 Units into the skin daily before breakfast.      . Insulin Syringe-Needle U-100 (INSULIN SYRINGE 1CC/31GX5/16") 31G X 5/16" 1 ML MISC USE AS DIRECTED TO INJECT INSULIN FOUR TIMES DAILY  120 each  3  . isosorbide mononitrate (IMDUR) 30 MG 24 hr tablet Take 1 tablet (30 mg total) by mouth daily.  30 tablet  6  . losartan (COZAAR) 50 MG tablet Take 50 mg by mouth daily.      . nitroGLYCERIN (NITROSTAT) 0.4 MG SL tablet Place 1 tablet (0.4 mg total) under the tongue every 5 (five) minutes as needed for chest pain.  25 tablet  5  . pantoprazole (PROTONIX) 40 MG tablet Take 40 mg by mouth daily.      . potassium chloride SA (K-DUR,KLOR-CON) 20 MEQ tablet Take 1 tablet (20 mEq total) by mouth daily.  30 tablet  6  . silodosin (RAPAFLO) 8 MG CAPS capsule Take 8 mg by mouth daily with breakfast.      . spironolactone (ALDACTONE) 25 MG tablet Take 0.5  tablets (12.5 mg total) by mouth daily.  30 tablet  6  . tiotropium (SPIRIVA) 18 MCG inhalation capsule Place 1 capsule (18 mcg total) into inhaler and inhale daily.  30 capsule  6  .  torsemide (DEMADEX) 20 MG tablet Take 2 tablet by mouth in the morning and 1 tablet in the afternoon around 2pm.  90 tablet  3  . torsemide (DEMADEX) 20 MG tablet TAKE 2 TABLETS BY MOUTH EVERY MORNING AND 1 IN THE AFTERNOON AROUND 2PM  90 tablet  2   No current facility-administered medications on file prior to visit.    Allergies  Allergen Reactions  . Sildenafil Nausea Only and Other (See Comments)    "took it once; heart went to pieces; never took it again"  . Percocet (Oxycodone-Acetaminophen) Other (See Comments)    Unknown reaction-possible altered mental state-per wife.    . Contrast Media (Iodinated Diagnostic Agents) Nausea Only    Family History  Problem Relation Age of Onset  . Heart disease Mother     Coronary Artery Disease  . Heart disease Father     Coronary Artery Disease  . Heart disease Paternal Grandmother     Coronary Artery Disease  . Colon cancer Neg Hx   . Diabetes Other     Grandmother    BP 122/64  Pulse 60  Temp(Src) 98.1 F (36.7 C) (Oral)  Resp 16  Wt 240 lb (108.863 kg)  BMI 38.76 kg/m2  SpO2 97%  Review of Systems denies hypoglycemia and fever    Objective:   Physical Exam VITAL SIGNS:  See vs page GENERAL: no distress 2 cm open wound at the left medial thigh, near the scrotum     Assessment & Plan:  DM: This insulin regimen was chosen from multiple options, for its simplicity.  The benefits of glycemic control must be weighed against the risks of hypoglycemia.   Skin ulcer, new.  Pt declines ref to wound-care Painful neuropathy, well-controlled

## 2013-01-17 NOTE — Patient Instructions (Addendum)
Here is a refill prescription for a pain patch, and one for the hydrocodone.   Please call in a few weeks to report progress.  Please see dr Marchelle Gearing, for your breathing.   Please come back for a regular physical after 04/16/13.   blood tests are being requested for you today.  We'll contact you with results.   Please apply antibiotic ointment to the ulcer.  Call if you want to see the wound specialist.

## 2013-01-20 ENCOUNTER — Other Ambulatory Visit: Payer: Self-pay

## 2013-01-20 DIAGNOSIS — E119 Type 2 diabetes mellitus without complications: Secondary | ICD-10-CM

## 2013-01-21 ENCOUNTER — Other Ambulatory Visit (INDEPENDENT_AMBULATORY_CARE_PROVIDER_SITE_OTHER): Payer: Medicare Other

## 2013-01-21 DIAGNOSIS — E119 Type 2 diabetes mellitus without complications: Secondary | ICD-10-CM

## 2013-01-21 LAB — HEMOGLOBIN A1C: Hgb A1c MFr Bld: 7.1 % — ABNORMAL HIGH (ref 4.6–6.5)

## 2013-01-22 ENCOUNTER — Other Ambulatory Visit: Payer: Self-pay | Admitting: Cardiovascular Disease

## 2013-01-24 ENCOUNTER — Telehealth: Payer: Self-pay | Admitting: Internal Medicine

## 2013-01-24 NOTE — Telephone Encounter (Signed)
I spoke with pt and spouse on the phone (had me on speaker). He stated has plenty of medication for now until his July appt. He applied for pt assistance but was over the yearly income limit so he does not qualify for this. He stated when he come sin he will discuss with MR about if he can change how he takes this medication or if he can be changed to something else. But since he has enough right now he will continue what he has. Nothing further was needed.

## 2013-01-31 ENCOUNTER — Encounter: Payer: Self-pay | Admitting: Cardiovascular Disease

## 2013-01-31 ENCOUNTER — Ambulatory Visit (INDEPENDENT_AMBULATORY_CARE_PROVIDER_SITE_OTHER): Payer: Medicare Other | Admitting: Cardiovascular Disease

## 2013-01-31 VITALS — BP 120/66 | HR 72 | Ht 64.0 in | Wt 245.0 lb

## 2013-01-31 DIAGNOSIS — I5022 Chronic systolic (congestive) heart failure: Secondary | ICD-10-CM

## 2013-01-31 MED ORDER — TORSEMIDE 20 MG PO TABS: ORAL_TABLET | ORAL | Status: DC

## 2013-01-31 MED ORDER — POTASSIUM CHLORIDE CRYS ER 20 MEQ PO TBCR: 20.0000 meq | EXTENDED_RELEASE_TABLET | Freq: Two times a day (BID) | ORAL | Status: DC

## 2013-01-31 MED ORDER — METOLAZONE 2.5 MG PO TABS: ORAL_TABLET | ORAL | Status: DC

## 2013-01-31 NOTE — Patient Instructions (Addendum)
Your physician has recommended you make the following change in your medication: INCREASE Torsemide to 60mg  by mouth twice a day, Please take Metolazone 2.5mg  30 minutes prior to your morning dose of Torsemide on Saturday and Tuesday, INCREASE Potassium Chloride to take one by mouth twice a day  Your physician recommends that you return for lab work in: 2 WEEKS (BMP)  Your physician recommends that you schedule a follow-up appointment in: 6 WEEKS with Dr Excell Seltzer

## 2013-02-03 ENCOUNTER — Telehealth: Payer: Self-pay | Admitting: Cardiovascular Disease

## 2013-02-03 DIAGNOSIS — I5022 Chronic systolic (congestive) heart failure: Secondary | ICD-10-CM

## 2013-02-03 NOTE — Telephone Encounter (Signed)
Reviewed with Dr Excell Seltzer. He recommended pt hold metolazone, continue increased torsemide dose of 60 mg bid, continue increased KCL of 20 mEq bid, repeat BMET this Thursday. LMTCB for pt. Alternate number [phone]  no answer and unable to leave voice mail.

## 2013-02-03 NOTE — Telephone Encounter (Signed)
Pts wife notified, verbalized understanding.

## 2013-02-03 NOTE — Telephone Encounter (Signed)
New Prob      Wife states pt is having muscles cramps after some medication changes from last week. Would like to speak to nurse.

## 2013-02-03 NOTE — Telephone Encounter (Signed)
I will forward to Dr Cooper for review.  

## 2013-02-03 NOTE — Telephone Encounter (Signed)
Spoke with patient's wife. On 01/31/13 at time of office visit with Dr Excell Seltzer torsemide was increased to 60mg  bid, KCL was increased to 20 mEq bid, and metolazone was to be taken Sat and Tues 30 minutes prior to torsemide. Pt did take metolazone on Sat AM. Pt complained of leg cramps Sun AM and later Sun afternoon. Pt  took an extra KCL each time he had leg cramps.   Pt's wife also states his weight is down a total of 9 pounds since Sat AM.

## 2013-02-04 ENCOUNTER — Encounter: Payer: Self-pay | Admitting: Cardiovascular Disease

## 2013-02-04 NOTE — Progress Notes (Signed)
HPI:  67 year old gentleman with multiple medical problems presenting for followup evaluation. The patient is severely limited from a physical perspective and he is in a motorized scooter. He is unable to walk. His comorbidities include long-standing diabetes, COPD, chronic kidney disease, and blindness. He has multiple cardiac problems as well. The patient has had previous bypass surgery. He had an anterolateral myocardial infarction with closure of a diagonal vein graft last year. This was complicated by cardiac arrest. The patient has moderate to severe LV dysfunction.  He complains of worsening leg and abdominal swelling. His chronic shortness of breath is essentially unchanged. He has not been following a diet. He is unable to weigh himself because of inability to stand without assistance. He denies orthopnea or PND. He's had no recent chest pain.  Outpatient Encounter Prescriptions as of 01/31/2013  Medication Sig Dispense Refill  . ADVAIR DISKUS 100-50 MCG/DOSE AEPB INHALE 1 PUFF BY MOUTH TWICE DAILY  1 each  3  . albuterol (PROVENTIL HFA;VENTOLIN HFA) 108 (90 BASE) MCG/ACT inhaler Inhale 2 puffs into the lungs every 6 (six) hours as needed. For shortness of breath      . aspirin 325 MG EC tablet Take 81 mg by mouth daily. Patient's wife cuts into fourths, gives @ 81mg  daily.      [name] atorvastatin (LIPITOR) 40 MG tablet Take 1 tablet (40 mg total) by mouth daily.  30 tablet  11  . carvedilol (COREG) 6.25 MG tablet Take 6.25 mg by mouth 2 (two) times daily with a meal.      . clopidogrel (PLAVIX) 75 MG tablet Take 1 tablet (75 mg total) by mouth daily with breakfast.  90 tablet  3  . clotrimazole (LOTRIMIN) 1 % cream Apply 1 application topically daily as needed (for rash around mid section).      [name] co-enzyme Q-10 30 MG capsule Take 30 mg by mouth daily.      Tery Sanfilippo Sodium (STOOL SOFTENER) 100 MG capsule Take 100 mg by mouth daily as needed. constipation      . fentaNYL (DURAGESIC - DOSED  MCG/HR) 25 MCG/HR Place 1 patch (25 mcg total) onto the skin every 3 (three) days.  10 patch  0  . Fluticasone-Salmeterol (ADVAIR DISKUS) 100-50 MCG/DOSE AEPB Inhale 1 puff into the lungs 2 (two) times daily.  1 each  11  . gabapentin (NEURONTIN) 600 MG tablet Take 1 tablet (600 mg total) by mouth 3 (three) times daily.  90 tablet  4  . HYDROcodone-acetaminophen (NORCO) 10-325 MG per tablet Take 1 tablet by mouth every 6 (six) hours as needed for pain.  100 tablet  3  . insulin NPH (HUMULIN N,NOVOLIN N) 100 UNIT/ML injection Inject 70 Units into the skin daily before breakfast.      . Insulin Syringe-Needle U-100 (INSULIN SYRINGE 1CC/31GX5/16") 31G X 5/16" 1 ML MISC USE AS DIRECTED TO INJECT INSULIN FOUR TIMES DAILY  120 each  3  . isosorbide mononitrate (IMDUR) 30 MG 24 hr tablet Take 1 tablet (30 mg total) by mouth daily.  30 tablet  6  . losartan (COZAAR) 50 MG tablet Take 50 mg by mouth daily.      . nitroGLYCERIN (NITROSTAT) 0.4 MG SL tablet Place 1 tablet (0.4 mg total) under the tongue every 5 (five) minutes as needed for chest pain.  25 tablet  5  . potassium chloride SA (K-DUR,KLOR-CON) 20 MEQ tablet Take 1 tablet (20 mEq total) by mouth 2 (two) times daily.  60  tablet  6  . silodosin (RAPAFLO) 8 MG CAPS capsule Take 8 mg by mouth daily with breakfast.      . spironolactone (ALDACTONE) 25 MG tablet Take 0.5 tablets (12.5 mg total) by mouth daily.  30 tablet  6  . tiotropium (SPIRIVA) 18 MCG inhalation capsule Place 1 capsule (18 mcg total) into inhaler and inhale daily.  30 capsule  6  . torsemide (DEMADEX) 20 MG tablet Take 3 tablets by mouth in the morning and 3 tablets in the afternoon around 2pm.  180 tablet  6  . [DISCONTINUED] potassium chloride SA (K-DUR,KLOR-CON) 20 MEQ tablet TAKE 1 TABLET BY MOUTH EVERY DAY  30 tablet  0  . [DISCONTINUED] torsemide (DEMADEX) 20 MG tablet Take 2 tablet by mouth in the morning and 1 tablet in the afternoon around 2pm.  90 tablet  3  . pantoprazole  (PROTONIX) 40 MG tablet Take 40 mg by mouth daily.      . [DISCONTINUED] metolazone (ZAROXOLYN) 2.5 MG tablet Take one tablet by mouth 30 minutes prior to morning Torsemide dose on Tuesday and Saturday  30 tablet  3  . [DISCONTINUED] torsemide (DEMADEX) 20 MG tablet TAKE 2 TABLETS BY MOUTH EVERY MORNING AND 1 IN THE AFTERNOON AROUND 2PM  90 tablet  2   No facility-administered encounter medications on file as of 01/31/2013.    Allergies  Allergen Reactions  . Sildenafil Nausea Only and Other (See Comments)    "took it once; heart went to pieces; never took it again"  . Percocet (Oxycodone-Acetaminophen) Other (See Comments)    Unknown reaction-possible altered mental state-per wife.    . Contrast Media (Iodinated Diagnostic Agents) Nausea Only    Past Medical History  Diagnosis Date  . CORONARY ARTERY DISEASE 03/11/2007    s/p CABG 1998;  admitted 08/2011 with an inferior STEMI.  LHC 09/07/11: Severe  Three-vessel CAD, LIMA-LAD patent, SVG-RCA chronically occluded, SVG-circumflex chronically occluded, SVG-diagonal 95%.  PCI: Promus DES to the SVG-diagonal.  Procedure c/b transient CHB req CPR and TTVP;  myoview 4/13:large severe inferolateral infarct, no ischemia, EF 25%    . DIABETES MELLITUS, TYPE I 03/11/2007  . HYPERTENSION 03/11/2007  . 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 HYPERCHOLESTEROLEMIA 09/28/2008  . CERVICAL RADICULOPATHY, LEFT 09/23/2008  . PERIPHERAL NEUROPATHY 09/23/2008  . Chronic systolic heart failure 09/23/2008    Echocardiogram 09/07/11: Difficult study, inferior and posterior lateral wall hypokinesis, mild LVH, EF 45%, mild LAE.   [name] RENAL INSUFFICIENCY 03/15/2010  . ED (erectile dysfunction)   . Hypogonadism male   . GI bleed 2012    Multiple Duodenal ulcer   . Obesity   . Legally blind     bilaterally  . Stroke 1    age 52 months; residual right sided weakness  . History of blood transfusion 2012  . Charcot foot due to diabetes mellitus     chronic  pain  . Osteoarthritis     right hip and shoulder  . COPD (chronic obstructive pulmonary disease)   . Myocardial infarction 2013b Jan  . PERIPHERAL VASCULAR INSUFFICIENCY,  LEGS, BILATERAL 08/17/2010    ABI's performed 04/19/11 R 0.72 L 0.71  . ANEMIA-IRON DEFICIENCY 09/23/2008    ulcer related last year  . Chronic edema     Chronic pedal edema    ROS: Negative except as per HPI  BP 120/66  Pulse 72  Ht 5\' 4"  (1.626 m)  Wt 111.131 kg (245 lb)  BMI 42.03 kg/m2  PHYSICAL EXAM: Pt  is alert and oriented, chronically ill-appearing, obese male in NAD HEENT: normal Neck: JVP - moderately elevated, carotids 2+= without bruits Lungs: CTA bilaterally CV: RRR without murmur or gallop Abd: soft, NT, obese Ext: Diffuse bilateral 2+ edema to the knees, distal pulses intact and equal Skin: Stasis ulcerations noted  ASSESSMENT AND PLAN: 1. Acute on chronic systolic heart failure. The patient admits to significant dietary and salt indiscretions. He cannot weigh himself because he is unable to stand on the scale. Clinically he is grossly volume overloaded. I am going to increase his torsemide to 60 mg twice daily. Will add metolazone 2.5 mg before his morning torsemide to be taken 2 days per week. Will followup a metabolic panel within 2 weeks. He will increase his potassium supplementation as directed. He was counseled extensively on diet modification and salt restriction.  2. The coronary artery disease status post CABG and PCI with history of myocardial infarction. He remains on dual antiplatelet therapy with aspirin and Plavix. He has a severe ischemic cardiomyopathy. No anginal symptoms at this time.  Mark French 02/04/2013 6:01 AM

## 2013-02-06 ENCOUNTER — Other Ambulatory Visit (INDEPENDENT_AMBULATORY_CARE_PROVIDER_SITE_OTHER): Payer: Medicare Other

## 2013-02-06 DIAGNOSIS — I5022 Chronic systolic (congestive) heart failure: Secondary | ICD-10-CM

## 2013-02-06 LAB — BASIC METABOLIC PANEL
CO2: 32 mEq/L (ref 19–32)
Calcium: 8.9 mg/dL (ref 8.4–10.5)
GFR: 40.7 mL/min — ABNORMAL LOW (ref >60.00)
Glucose, Bld: 215 mg/dL — ABNORMAL HIGH (ref 70–99)
Potassium: 4.6 mEq/L (ref 3.5–5.1)
Sodium: 132 mEq/L — ABNORMAL LOW (ref 135–145)

## 2013-02-12 ENCOUNTER — Ambulatory Visit (INDEPENDENT_AMBULATORY_CARE_PROVIDER_SITE_OTHER): Payer: Medicare Other | Admitting: Internal Medicine

## 2013-02-12 ENCOUNTER — Encounter: Payer: Self-pay | Admitting: Internal Medicine

## 2013-02-12 VITALS — BP 110/70 | HR 122 | Temp 98.1°F | Ht 64.0 in | Wt 234.0 lb

## 2013-02-12 DIAGNOSIS — J449 Chronic obstructive pulmonary disease, unspecified: Secondary | ICD-10-CM

## 2013-02-12 DIAGNOSIS — J45909 Unspecified asthma, uncomplicated: Secondary | ICD-10-CM

## 2013-02-12 DIAGNOSIS — J4540 Moderate persistent asthma, uncomplicated: Secondary | ICD-10-CM

## 2013-02-12 MED ORDER — IPRATROPIUM BROMIDE 0.02 % IN SOLN: 500.0000 ug | Freq: Four times a day (QID) | RESPIRATORY_TRACT | Status: DC

## 2013-02-12 MED ORDER — ALBUTEROL SULFATE (2.5 MG/3ML) 0.083% IN NEBU: 2.5000 mg | INHALATION_SOLUTION | Freq: Four times a day (QID) | RESPIRATORY_TRACT | Status: DC | PRN

## 2013-02-12 MED ORDER — BECLOMETHASONE DIPROPIONATE 80 MCG/ACT IN AERS: 2.0000 | INHALATION_SPRAY | Freq: Two times a day (BID) | RESPIRATORY_TRACT | Status: DC

## 2013-02-12 NOTE — Patient Instructions (Addendum)
Glad you are doing well Stop spiriva and advair Start QVAR 2 puff twice daily; take sample and learn technique Do albuterol and atrovent nebulizer 4 times daily Call us if above is still too expensive Return to see me in 2 months

## 2013-02-12 NOTE — Assessment & Plan Note (Signed)
Glad you are doing well Stop spiriva and advair Start QVAR 80mcg 2 puff twice daily; take sample and learn technique Do albuterol and atrovent nebulizer 4 times daily Call us if above is still too expensive Return to see me in 2 months 

## 2013-02-12 NOTE — Progress Notes (Signed)
Subjective:    Patient ID: Mark French, male    DOB: 1945-12-20, 67 y.o.   MRN: [id number]  HPI IOV 01/03/2012  67 year old Body mass index is 36.48 kg/(m^2).  reports that he has never smoked. He has never used smokeless tobacco. 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  CAD and s/p CABG 1998 and now  MI in Sep 07, 2011 and s/p stent (cardiac arrest for '20 sec'). Went to [facility] nursing home and since then reports dyspnea on exertion. Progressive. Insidious onset. Also using roller assist to walk since MI in Jan 2013 but this is due to hip issues and diabetic retinopathy. Wife and he report associated significant deconditioning since Jan 2013. This is improving but not to the desired extent. Denies associated smoking or snoring. Dyspnea is associtaed with orthopnea and paroxysnal nocturnal dyspnea but this is chronic > 15 years and unchangd since jan 2013. HDyspnea is aggravated by "anything" which is at random and even at rest. Dyspnea relieved by advair disk started early maay 2013.    Exposure  reports that he has never smoked. He has never used smokeless tobacco. 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  12/05/11: Nuc med stress test Overall Impression: Abnormal stress nuclear study with a large, severe, fixed inferolateral defect consistent with previous infarct; no ischemia.  LV Ejection Fraction: 25% (note on Sep 07, 2011 ef 45% but no prior data available). LV Wall Motion: Inferolateral akinesis. Cards thought it was worse and meds adjusted. Per family no repepat cath due to renal function concern   Spirometry 12/20/11  - fev1 1.75L/53%, FVC 2.25L/73% and c/w restriction  Labs 12/31/11  creat 1.4mg %  hgb 11.9gm%  CXR 01/01/12 IMPRESSION:  No evidence of active pulmonary disease.  Original Report  Authenticated By: Marlon Pel, M  REC Difficult situation with shortness of breath  Your heart, weight, lack of fitness, diabetes, all contributing to shortness of breath I do not think there is lung disease per se and the abnormal breathing test probably reflects your weight  Unfortunately there is no magic bullet Continue cardiology advice Because you feel strongly that advair helps you, we will honor that and give you advair 1 puff twice daily and albuterol as needed   - my nurse will check if you need refills We will refer you to pulmonary rehab Followup 2 months or sooner if needed with full PFT   OV 03/15/2012  Followup dyspnea  Advair helping some. But overall no change in dyspnea. PFT overall unchanged despite advair. He has no new complaints  PFTs - Fev1 1.78L/68%, Ratio 76 (70). Small airways 55%, TLC 4L/73%, DLCO 16/68%  REC Difficult situation with shortness of breath  Your heart, weight, lack of fitness, diabetes, all contributing to shortness of breath In addition you likely have asthma/copd based on improvement with advair and breathing test results Unfortunately there is no magic bullet to help you because you cannot do rehab at hospital due to vision issues and joint issues I recommend you  - Continue cardiology advice  - continue advair  - Please start spiriva 1 puff daily - take sample, script and show technique  - I am tryoing to arrange a pulmonary program for you at home Return in 3 months or sooner if needed  OV 02/12/2013  FU dyspnea with asthma  Says he is spending > $ 200 per month for advair/spiriva. Both are helping though but in bankrupting him. However,  He finds drugs to be useful and very helpful for dyspnea. Does not want tog ive up. I learned today he had smoke exposure a lot working as Company secretary. No new issues.    Review of Systems  Constitutional: Negative for fever and unexpected weight change.  HENT: Negative for ear pain,  nosebleeds, congestion, sore throat, rhinorrhea, sneezing, trouble swallowing, dental problem, postnasal drip and sinus pressure.   Eyes: Negative for redness and itching.  Respiratory: Positive for shortness of breath. Negative for cough, chest tightness and wheezing.   Cardiovascular: Negative for palpitations and leg swelling.  Gastrointestinal: Negative for nausea and vomiting.  Genitourinary: Negative for dysuria.  Musculoskeletal: Negative for joint swelling.  Skin: Negative for rash.  Neurological: Negative for headaches.  Hematological: Does not bruise/bleed easily.  Psychiatric/Behavioral: Negative for dysphoric mood. The patient is not nervous/anxious.        Objective:   Physical Exam Physical Exam  Nursing note and vitals reviewed. Constitutional: He is oriented to person, place, and time. He appears well-developed and well-nourished. No distress.       Obese Legally blind  - sitting on a scooter Looks deconditioned  HENT:  Head: Normocephalic and atraumatic.  Right Ear: External ear normal.  Left Ear: External ear normal.  Mouth/Throat: Oropharynx is clear and moist. No oropharyngeal exudate.  Eyes: Conjunctivae and EOM are normal. Pupils are equal, round, and reactive to light. Right eye exhibits no discharge. Left eye exhibits no discharge. No scleral icterus.  Neck: Normal range of motion. Neck supple. No JVD present. No tracheal deviation present. No thyromegaly present.  Cardiovascular: Normal rate, regular rhythm and intact distal pulses.  Exam reveals no gallop and no friction rub.   No murmur heard. Pulmonary/Chest: Effort normal and breath sounds normal. No respiratory distress. He has no wheezes. He has no rales. He exhibits no tenderness.  Abdominal: Soft. Bowel sounds are normal. He exhibits no distension and no mass. There is no tenderness. There is no rebound and no guarding.  Musculoskeletal: Poor function RUE Lymphadenopathy:    He has no cervical  adenopathy.  Neurological: He is alert and oriented to person, place, and time. He has normal reflexes. No cranial nerve deficit. Coordination normal.  Skin: Skin is warm and dry. No rash noted. He is not diaphoretic. No erythema. No pallor.  Psychiatric: He has a normal mood and affect. His behavior is normal. Judgment and thought content normal.              Assessment & Plan:

## 2013-02-14 ENCOUNTER — Other Ambulatory Visit: Payer: Medicare Other

## 2013-02-17 ENCOUNTER — Other Ambulatory Visit: Payer: Self-pay | Admitting: Cardiovascular Disease

## 2013-02-21 ENCOUNTER — Inpatient Hospital Stay (HOSPITAL_COMMUNITY)
Admission: EM | Admit: 2013-02-21 | Discharge: 2013-02-22 | DRG: 292 | Disposition: A | Discharge status: Home / Self Care | Payer: Medicare Other | Attending: Internal Medicine | Admitting: Internal Medicine

## 2013-02-21 ENCOUNTER — Encounter (HOSPITAL_COMMUNITY): Payer: Self-pay | Admitting: *Deleted

## 2013-02-21 ENCOUNTER — Emergency Department (HOSPITAL_COMMUNITY): Payer: Medicare Other

## 2013-02-21 ENCOUNTER — Other Ambulatory Visit: Payer: Self-pay

## 2013-02-21 DIAGNOSIS — M79609 Pain in unspecified limb: Secondary | ICD-10-CM

## 2013-02-21 DIAGNOSIS — M25552 Pain in left hip: Secondary | ICD-10-CM

## 2013-02-21 DIAGNOSIS — Z8673 Personal history of transient ischemic attack (TIA), and cerebral infarction without residual deficits: Secondary | ICD-10-CM

## 2013-02-21 DIAGNOSIS — I200 Unstable angina: Secondary | ICD-10-CM

## 2013-02-21 DIAGNOSIS — E1039 Type 1 diabetes mellitus with other diabetic ophthalmic complication: Secondary | ICD-10-CM | POA: Diagnosis present

## 2013-02-21 DIAGNOSIS — I5023 Acute on chronic systolic (congestive) heart failure: Principal | ICD-10-CM

## 2013-02-21 DIAGNOSIS — I5022 Chronic systolic (congestive) heart failure: Secondary | ICD-10-CM

## 2013-02-21 DIAGNOSIS — Z125 Encounter for screening for malignant neoplasm of prostate: Secondary | ICD-10-CM

## 2013-02-21 DIAGNOSIS — E1049 Type 1 diabetes mellitus with other diabetic neurological complication: Secondary | ICD-10-CM

## 2013-02-21 DIAGNOSIS — Z8249 Family history of ischemic heart disease and other diseases of the circulatory system: Secondary | ICD-10-CM

## 2013-02-21 DIAGNOSIS — I10 Essential (primary) hypertension: Secondary | ICD-10-CM

## 2013-02-21 DIAGNOSIS — I739 Peripheral vascular disease, unspecified: Secondary | ICD-10-CM

## 2013-02-21 DIAGNOSIS — F329 Major depressive disorder, single episode, unspecified: Secondary | ICD-10-CM

## 2013-02-21 DIAGNOSIS — Z8679 Personal history of other diseases of the circulatory system: Secondary | ICD-10-CM

## 2013-02-21 DIAGNOSIS — E11359 Type 2 diabetes mellitus with proliferative diabetic retinopathy without macular edema: Secondary | ICD-10-CM

## 2013-02-21 DIAGNOSIS — H543 Unqualified visual loss, both eyes: Secondary | ICD-10-CM | POA: Diagnosis present

## 2013-02-21 DIAGNOSIS — E119 Type 2 diabetes mellitus without complications: Secondary | ICD-10-CM

## 2013-02-21 DIAGNOSIS — I509 Heart failure, unspecified: Secondary | ICD-10-CM | POA: Diagnosis present

## 2013-02-21 DIAGNOSIS — G609 Hereditary and idiopathic neuropathy, unspecified: Secondary | ICD-10-CM

## 2013-02-21 DIAGNOSIS — R200 Anesthesia of skin: Secondary | ICD-10-CM

## 2013-02-21 DIAGNOSIS — I25810 Atherosclerosis of coronary artery bypass graft(s) without angina pectoris: Secondary | ICD-10-CM

## 2013-02-21 DIAGNOSIS — Z794 Long term (current) use of insulin: Secondary | ICD-10-CM

## 2013-02-21 DIAGNOSIS — I129 Hypertensive chronic kidney disease with stage 1 through stage 4 chronic kidney disease, or unspecified chronic kidney disease: Secondary | ICD-10-CM | POA: Diagnosis present

## 2013-02-21 DIAGNOSIS — R079 Chest pain, unspecified: Secondary | ICD-10-CM

## 2013-02-21 DIAGNOSIS — Z885 Allergy status to narcotic agent status: Secondary | ICD-10-CM

## 2013-02-21 DIAGNOSIS — R0600 Dyspnea, unspecified: Secondary | ICD-10-CM

## 2013-02-21 DIAGNOSIS — F3289 Other specified depressive episodes: Secondary | ICD-10-CM

## 2013-02-21 DIAGNOSIS — R609 Edema, unspecified: Secondary | ICD-10-CM

## 2013-02-21 DIAGNOSIS — D509 Iron deficiency anemia, unspecified: Secondary | ICD-10-CM

## 2013-02-21 DIAGNOSIS — A5211 Tabes dorsalis: Secondary | ICD-10-CM | POA: Diagnosis present

## 2013-02-21 DIAGNOSIS — R0602 Shortness of breath: Secondary | ICD-10-CM

## 2013-02-21 DIAGNOSIS — Z66 Do not resuscitate: Secondary | ICD-10-CM | POA: Diagnosis present

## 2013-02-21 DIAGNOSIS — IMO0001 Reserved for inherently not codable concepts without codable children: Secondary | ICD-10-CM

## 2013-02-21 DIAGNOSIS — D649 Anemia, unspecified: Secondary | ICD-10-CM

## 2013-02-21 DIAGNOSIS — N184 Chronic kidney disease, stage 4 (severe): Secondary | ICD-10-CM | POA: Diagnosis present

## 2013-02-21 DIAGNOSIS — M1610 Unilateral primary osteoarthritis, unspecified hip: Secondary | ICD-10-CM | POA: Diagnosis present

## 2013-02-21 DIAGNOSIS — E7800 Pure hypercholesterolemia, unspecified: Secondary | ICD-10-CM

## 2013-02-21 DIAGNOSIS — M169 Osteoarthritis of hip, unspecified: Secondary | ICD-10-CM | POA: Diagnosis present

## 2013-02-21 DIAGNOSIS — Z79899 Other long term (current) drug therapy: Secondary | ICD-10-CM

## 2013-02-21 DIAGNOSIS — R202 Paresthesia of skin: Secondary | ICD-10-CM

## 2013-02-21 DIAGNOSIS — Z9861 Coronary angioplasty status: Secondary | ICD-10-CM

## 2013-02-21 DIAGNOSIS — R5381 Other malaise: Secondary | ICD-10-CM

## 2013-02-21 DIAGNOSIS — N289 Disorder of kidney and ureter, unspecified: Secondary | ICD-10-CM

## 2013-02-21 DIAGNOSIS — M5412 Radiculopathy, cervical region: Secondary | ICD-10-CM

## 2013-02-21 DIAGNOSIS — E291 Testicular hypofunction: Secondary | ICD-10-CM

## 2013-02-21 DIAGNOSIS — Z91041 Radiographic dye allergy status: Secondary | ICD-10-CM

## 2013-02-21 DIAGNOSIS — M19019 Primary osteoarthritis, unspecified shoulder: Secondary | ICD-10-CM | POA: Diagnosis present

## 2013-02-21 DIAGNOSIS — N183 Chronic kidney disease, stage 3 unspecified: Secondary | ICD-10-CM

## 2013-02-21 DIAGNOSIS — L97909 Non-pressure chronic ulcer of unspecified part of unspecified lower leg with unspecified severity: Secondary | ICD-10-CM

## 2013-02-21 DIAGNOSIS — L0390 Cellulitis, unspecified: Secondary | ICD-10-CM

## 2013-02-21 DIAGNOSIS — J449 Chronic obstructive pulmonary disease, unspecified: Secondary | ICD-10-CM

## 2013-02-21 DIAGNOSIS — N259 Disorder resulting from impaired renal tubular function, unspecified: Secondary | ICD-10-CM

## 2013-02-21 DIAGNOSIS — I252 Old myocardial infarction: Secondary | ICD-10-CM

## 2013-02-21 DIAGNOSIS — M71161 Other infective bursitis, right knee: Secondary | ICD-10-CM

## 2013-02-21 DIAGNOSIS — H548 Legal blindness, as defined in USA: Secondary | ICD-10-CM

## 2013-02-21 DIAGNOSIS — J309 Allergic rhinitis, unspecified: Secondary | ICD-10-CM

## 2013-02-21 DIAGNOSIS — N400 Enlarged prostate without lower urinary tract symptoms: Secondary | ICD-10-CM

## 2013-02-21 DIAGNOSIS — J4489 Other specified chronic obstructive pulmonary disease: Secondary | ICD-10-CM | POA: Diagnosis present

## 2013-02-21 LAB — CBC WITH DIFFERENTIAL/PLATELET
Basophils Absolute: 0 10*3/uL (ref 0.0–0.1)
Basophils Relative: 0 % (ref 0–1)
HCT: 38.3 % — ABNORMAL LOW (ref 39.0–52.0)
MCHC: 33.4 g/dL (ref 30.0–36.0)
Monocytes Absolute: 0.7 10*3/uL (ref 0.1–1.0)
Neutro Abs: 5.2 10*3/uL (ref 1.7–7.7)
Platelets: 164 10*3/uL (ref 150–400)
RDW: 15 % (ref 11.5–15.5)

## 2013-02-21 LAB — BASIC METABOLIC PANEL
BUN: 34 mg/dL — ABNORMAL HIGH (ref 6–23)
CO2: 27 mEq/L (ref 19–32)
Calcium: 9.3 mg/dL (ref 8.4–10.5)
Creatinine, Ser: 1.51 mg/dL — ABNORMAL HIGH (ref 0.50–1.35)
Glucose, Bld: 84 mg/dL (ref 70–99)
Sodium: 135 mEq/L (ref 135–145)

## 2013-02-21 LAB — GLUCOSE, CAPILLARY: Glucose-Capillary: 72 mg/dL (ref 70–99)

## 2013-02-21 LAB — TROPONIN I
Troponin I: <0.3 ng/mL (ref <0.30)
Troponin I: <0.3 ng/mL (ref <0.30)

## 2013-02-21 MED ORDER — FLUTICASONE PROPIONATE HFA 44 MCG/ACT IN AERO
2.0000 | INHALATION_SPRAY | Freq: Two times a day (BID) | RESPIRATORY_TRACT | Status: DC
  Administered 2013-02-21 – 2013-02-22 (×2): 2 via RESPIRATORY_TRACT
  Filled 2013-02-21: qty 10.6

## 2013-02-21 MED ORDER — IPRATROPIUM BROMIDE 0.02 % IN SOLN
0.5000 mg | Freq: Once | RESPIRATORY_TRACT | Status: AC
  Administered 2013-02-21: 0.5 mg via RESPIRATORY_TRACT
  Filled 2013-02-21: qty 2.5

## 2013-02-21 MED ORDER — ATORVASTATIN CALCIUM 40 MG PO TABS
40.0000 mg | ORAL_TABLET | Freq: Every day | ORAL | Status: DC
  Administered 2013-02-21 – 2013-02-22 (×2): 40 mg via ORAL
  Filled 2013-02-21 (×2): qty 1

## 2013-02-21 MED ORDER — HYDROCODONE-ACETAMINOPHEN 5-325 MG PO TABS
2.0000 | ORAL_TABLET | Freq: Once | ORAL | Status: AC
  Administered 2013-02-21: 2 via ORAL
  Filled 2013-02-21: qty 2

## 2013-02-21 MED ORDER — INSULIN NPH (HUMAN) (ISOPHANE) 100 UNIT/ML ~~LOC~~ SUSP
70.0000 [IU] | Freq: Once | SUBCUTANEOUS | Status: DC
  Filled 2013-02-21: qty 10

## 2013-02-21 MED ORDER — FUROSEMIDE 10 MG/ML IJ SOLN
40.0000 mg | Freq: Two times a day (BID) | INTRAMUSCULAR | Status: DC
  Administered 2013-02-21 – 2013-02-22 (×2): 40 mg via INTRAVENOUS
  Filled 2013-02-21 (×4): qty 4

## 2013-02-21 MED ORDER — HYDROCODONE-ACETAMINOPHEN 10-325 MG PO TABS
1.0000 | ORAL_TABLET | Freq: Four times a day (QID) | ORAL | Status: DC | PRN
  Administered 2013-02-21 – 2013-02-22 (×3): 1 via ORAL
  Filled 2013-02-21 (×3): qty 1

## 2013-02-21 MED ORDER — ONDANSETRON HCL 4 MG/2ML IJ SOLN: 4.0000 mg | Freq: Four times a day (QID) | INTRAMUSCULAR | Status: DC | PRN

## 2013-02-21 MED ORDER — NITROGLYCERIN 0.4 MG SL SUBL: 0.4000 mg | SUBLINGUAL_TABLET | SUBLINGUAL | Status: DC | PRN

## 2013-02-21 MED ORDER — ASPIRIN 81 MG PO CHEW
324.0000 mg | CHEWABLE_TABLET | Freq: Once | ORAL | Status: AC
  Administered 2013-02-21: 324 mg via ORAL
  Filled 2013-02-21: qty 4

## 2013-02-21 MED ORDER — GABAPENTIN 600 MG PO TABS
600.0000 mg | ORAL_TABLET | Freq: Three times a day (TID) | ORAL | Status: DC
  Administered 2013-02-21 – 2013-02-22 (×3): 600 mg via ORAL
  Filled 2013-02-21 (×5): qty 1

## 2013-02-21 MED ORDER — ALBUTEROL SULFATE (5 MG/ML) 0.5% IN NEBU
5.0000 mg | INHALATION_SOLUTION | Freq: Once | RESPIRATORY_TRACT | Status: AC
  Administered 2013-02-21: 5 mg via RESPIRATORY_TRACT
  Filled 2013-02-21: qty 1

## 2013-02-21 MED ORDER — ISOSORBIDE MONONITRATE ER 30 MG PO TB24
30.0000 mg | ORAL_TABLET | Freq: Every day | ORAL | Status: DC
  Administered 2013-02-21 – 2013-02-22 (×2): 30 mg via ORAL
  Filled 2013-02-21 (×2): qty 1

## 2013-02-21 MED ORDER — CARVEDILOL 6.25 MG PO TABS
6.2500 mg | ORAL_TABLET | Freq: Two times a day (BID) | ORAL | Status: DC
  Administered 2013-02-21: 6.25 mg via ORAL
  Filled 2013-02-21 (×4): qty 1

## 2013-02-21 MED ORDER — ALBUTEROL SULFATE (5 MG/ML) 0.5% IN NEBU: 2.5000 mg | INHALATION_SOLUTION | RESPIRATORY_TRACT | Status: DC | PRN

## 2013-02-21 MED ORDER — TIOTROPIUM BROMIDE MONOHYDRATE 18 MCG IN CAPS
18.0000 ug | ORAL_CAPSULE | Freq: Every day | RESPIRATORY_TRACT | Status: DC
  Administered 2013-02-21 – 2013-02-22 (×2): 18 ug via RESPIRATORY_TRACT
  Filled 2013-02-21: qty 5

## 2013-02-21 MED ORDER — INSULIN NPH (HUMAN) (ISOPHANE) 100 UNIT/ML ~~LOC~~ SUSP
60.0000 [IU] | Freq: Every day | SUBCUTANEOUS | Status: DC
  Administered 2013-02-22: 60 [IU] via SUBCUTANEOUS
  Filled 2013-02-21: qty 10

## 2013-02-21 MED ORDER — TAMSULOSIN HCL 0.4 MG PO CAPS
0.4000 mg | ORAL_CAPSULE | Freq: Every day | ORAL | Status: DC
  Administered 2013-02-21 – 2013-02-22 (×2): 0.4 mg via ORAL
  Filled 2013-02-21 (×2): qty 1

## 2013-02-21 MED ORDER — ASPIRIN EC 81 MG PO TBEC
81.0000 mg | DELAYED_RELEASE_TABLET | Freq: Every day | ORAL | Status: DC
  Administered 2013-02-22: 81 mg via ORAL
  Filled 2013-02-21 (×2): qty 1

## 2013-02-21 MED ORDER — ENOXAPARIN SODIUM 30 MG/0.3ML ~~LOC~~ SOLN
30.0000 mg | SUBCUTANEOUS | Status: DC
  Administered 2013-02-21: 30 mg via SUBCUTANEOUS
  Filled 2013-02-21 (×2): qty 0.3

## 2013-02-21 MED ORDER — PANTOPRAZOLE SODIUM 40 MG PO TBEC
40.0000 mg | DELAYED_RELEASE_TABLET | Freq: Every day | ORAL | Status: DC
  Administered 2013-02-21 – 2013-02-22 (×2): 40 mg via ORAL
  Filled 2013-02-21: qty 1

## 2013-02-21 MED ORDER — ONDANSETRON HCL 4 MG/2ML IJ SOLN
4.0000 mg | Freq: Once | INTRAMUSCULAR | Status: AC
  Administered 2013-02-21: 4 mg via INTRAVENOUS
  Filled 2013-02-21: qty 2

## 2013-02-21 MED ORDER — HYDROMORPHONE HCL PF 1 MG/ML IJ SOLN
1.0000 mg | INTRAMUSCULAR | Status: DC | PRN
  Administered 2013-02-21: 1 mg via INTRAVENOUS
  Filled 2013-02-21: qty 1

## 2013-02-21 MED ORDER — MORPHINE SULFATE 4 MG/ML IJ SOLN
4.0000 mg | Freq: Once | INTRAMUSCULAR | Status: DC
  Filled 2013-02-21: qty 1

## 2013-02-21 MED ORDER — PREDNISONE 20 MG PO TABS
60.0000 mg | ORAL_TABLET | Freq: Once | ORAL | Status: AC
  Administered 2013-02-21: 60 mg via ORAL
  Filled 2013-02-21: qty 3

## 2013-02-21 MED ORDER — DOCUSATE SODIUM 100 MG PO CAPS: 100.0000 mg | ORAL_CAPSULE | Freq: Every day | ORAL | Status: DC | PRN

## 2013-02-21 MED ORDER — POTASSIUM CHLORIDE CRYS ER 20 MEQ PO TBCR
20.0000 meq | EXTENDED_RELEASE_TABLET | Freq: Two times a day (BID) | ORAL | Status: DC
  Administered 2013-02-21 – 2013-02-22 (×3): 20 meq via ORAL
  Filled 2013-02-21 (×4): qty 1

## 2013-02-21 MED ORDER — FUROSEMIDE 10 MG/ML IJ SOLN
40.0000 mg | Freq: Once | INTRAMUSCULAR | Status: AC
  Administered 2013-02-21: 40 mg via INTRAVENOUS
  Filled 2013-02-21: qty 4

## 2013-02-21 MED ORDER — LOSARTAN POTASSIUM 50 MG PO TABS
50.0000 mg | ORAL_TABLET | Freq: Every day | ORAL | Status: DC
  Administered 2013-02-21 – 2013-02-22 (×2): 50 mg via ORAL
  Filled 2013-02-21 (×2): qty 1

## 2013-02-21 MED ORDER — FENTANYL 25 MCG/HR TD PT72
25.0000 ug | MEDICATED_PATCH | TRANSDERMAL | Status: DC
  Administered 2013-02-21: 25 ug via TRANSDERMAL
  Filled 2013-02-21: qty 1

## 2013-02-21 MED ORDER — DOCUSATE SODIUM 100 MG PO TABS: 100.0000 mg | ORAL_TABLET | Freq: Every day | ORAL | Status: DC | PRN

## 2013-02-21 MED ORDER — CLOPIDOGREL BISULFATE 75 MG PO TABS
75.0000 mg | ORAL_TABLET | Freq: Every day | ORAL | Status: DC
  Administered 2013-02-22: 75 mg via ORAL
  Filled 2013-02-21 (×2): qty 1

## 2013-02-21 MED ORDER — INSULIN ASPART 100 UNIT/ML ~~LOC~~ SOLN
0.0000 [IU] | Freq: Three times a day (TID) | SUBCUTANEOUS | Status: DC
  Administered 2013-02-21 – 2013-02-22 (×2): 3 [IU] via SUBCUTANEOUS

## 2013-02-21 NOTE — ED Notes (Signed)
Pt denies any shortness of breath at this time.

## 2013-02-21 NOTE — ED Notes (Signed)
Per EMS: pt coming from home with sudden onset of shortness of breath. Pt was awaken from sleep, c/o of some mild chest tightness with the shortness of breath. Pt given nitro x1 bought pain from 4 to a 1. Pt is A&Ox4, respriations equal and unlabored, skin warm and dry. No respiratory distress noted

## 2013-02-21 NOTE — Consult Note (Signed)
CONSULT NOTE  Date: 02/21/2013               Patient Name:  Mark French MRN: [id number]  DOB: Aug 21, 1945 Age / Sex: 67 y.o., male        PCP: [name] Primary Cardiologist: Excell Seltzer            Referring Physician: Donette Larry, MD              Reason for Consult: Shortness of breath           History of Present Illness: Patient is a 68 y.o. male with a PMHx of CAD, CHF, COPD, CKD, blindness, , who was admitted to [facility] on 02/21/2013 for evaluation of dyspnea.    He has a hx of COPD.  This week his breathing meds were changed - the spiriva and advair was changed to QVAR 80 mcg 1 puff twice a day - he was supposed to be taking 2 puffs twice a day.    He is blind and so he does not know his medications at all.  His wife fixes his meds for him.    He has had angina in the past - he denies any angina pain.     Medications: Outpatient medications:  (Not in a hospital admission)  Current medications: Current Facility-Administered Medications  Medication Dose Route Frequency Provider Last Rate Last Dose  . morphine 4 MG/ML injection 4 mg  4 mg Intravenous Once Glynn Octave, MD       Current Outpatient Prescriptions  Medication Sig Dispense Refill  . albuterol (PROVENTIL HFA;VENTOLIN HFA) 108 (90 BASE) MCG/ACT inhaler Inhale 2 puffs into the lungs every 6 (six) hours as needed. For shortness of breath      . albuterol (PROVENTIL) (2.5 MG/3ML) 0.083% nebulizer solution Take 3 mLs (2.5 mg total) by nebulization every 6 (six) hours as needed for wheezing.  360 mL  12  . aspirin 325 MG EC tablet Take 81 mg by mouth daily. Patient's wife cuts into fourths, gives @ 81mg  daily.      [name] atorvastatin (LIPITOR) 40 MG tablet Take 1 tablet (40 mg total) by mouth daily.  30 tablet  11  . beclomethasone (QVAR) 80 MCG/ACT inhaler Inhale 2 puffs into the lungs 2 (two) times daily.  1 Inhaler  6  . carvedilol (COREG) 6.25 MG tablet Take 6.25 mg by mouth 2 (two) times daily with a meal.        . clopidogrel (PLAVIX) 75 MG tablet Take 1 tablet (75 mg total) by mouth daily with breakfast.  90 tablet  3  . clotrimazole (LOTRIMIN) 1 % cream Apply 1 application topically daily as needed (for rash around mid section).      [name] co-enzyme Q-10 30 MG capsule Take 30 mg by mouth daily.      Tery Sanfilippo Sodium (STOOL SOFTENER) 100 MG capsule Take 100 mg by mouth daily as needed. constipation      . fentaNYL (DURAGESIC - DOSED MCG/HR) 25 MCG/HR Place 1 patch (25 mcg total) onto the skin every 3 (three) days.  10 patch  0  . gabapentin (NEURONTIN) 600 MG tablet Take 1 tablet (600 mg total) by mouth 3 (three) times daily.  90 tablet  4  . HYDROcodone-acetaminophen (NORCO) 10-325 MG per tablet Take 1 tablet by mouth every 6 (six) hours as needed for pain.  100 tablet  3  . insulin NPH (HUMULIN N,NOVOLIN N) 100 UNIT/ML injection Inject 70 Units  into the skin daily before breakfast.      . ipratropium (ATROVENT) 0.02 % nebulizer solution Take 2.5 mLs (500 mcg total) by nebulization 4 (four) times daily.  75 mL  12  . isosorbide mononitrate (IMDUR) 30 MG 24 hr tablet Take 1 tablet (30 mg total) by mouth daily.  30 tablet  6  . losartan (COZAAR) 50 MG tablet Take 50 mg by mouth daily.      . nitroGLYCERIN (NITROSTAT) 0.4 MG SL tablet Place 1 tablet (0.4 mg total) under the tongue every 5 (five) minutes as needed for chest pain.  25 tablet  5  . pantoprazole (PROTONIX) 40 MG tablet Take 40 mg by mouth daily.      . potassium chloride SA (K-DUR,KLOR-CON) 20 MEQ tablet Take 1 tablet (20 mEq total) by mouth 2 (two) times daily.  60 tablet  6  . silodosin (RAPAFLO) 8 MG CAPS capsule Take 8 mg by mouth daily with breakfast.      . spironolactone (ALDACTONE) 25 MG tablet Take 0.5 tablets (12.5 mg total) by mouth daily.  30 tablet  6  . tiotropium (SPIRIVA) 18 MCG inhalation capsule Place 1 capsule (18 mcg total) into inhaler and inhale daily.  30 capsule  6  . torsemide (DEMADEX) 20 MG tablet Take 3 tablets by  mouth in the morning and 3 tablets in the afternoon around 2pm.  180 tablet  6  . Insulin Syringe-Needle U-100 (INSULIN SYRINGE 1CC/31GX5/16") 31G X 5/16" 1 ML MISC USE AS DIRECTED TO INJECT INSULIN FOUR TIMES DAILY  120 each  3     Allergies  Allergen Reactions  . Sildenafil Nausea Only and Other (See Comments)    "took it once; heart went to pieces; never took it again"  . Percocet (Oxycodone-Acetaminophen) Other (See Comments)    Unknown reaction-possible altered mental state-per wife.    . Contrast Media (Iodinated Diagnostic Agents) Nausea Only     Past Medical History  Diagnosis Date  . CORONARY ARTERY DISEASE 03/11/2007    s/p CABG 1998;  admitted 08/2011 with an inferior STEMI.  LHC 09/07/11: Severe  Three-vessel CAD, LIMA-LAD patent, SVG-RCA chronically occluded, SVG-circumflex chronically occluded, SVG-diagonal 95%.  PCI: Promus DES to the SVG-diagonal.  Procedure c/b transient CHB req CPR and TTVP;  myoview 4/13:large severe inferolateral infarct, no ischemia, EF 25%    . DIABETES MELLITUS, TYPE I 03/11/2007  . HYPERTENSION 03/11/2007  . 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 HYPERCHOLESTEROLEMIA 09/28/2008  . CERVICAL RADICULOPATHY, LEFT 09/23/2008  . PERIPHERAL NEUROPATHY 09/23/2008  . Chronic systolic heart failure 09/23/2008    Echocardiogram 09/07/11: Difficult study, inferior and posterior lateral wall hypokinesis, mild LVH, EF 45%, mild LAE.   [name] RENAL INSUFFICIENCY 03/15/2010  . ED (erectile dysfunction)   . Hypogonadism male   . GI bleed 2012    Multiple Duodenal ulcer   . Obesity   . Legally blind     bilaterally  . Stroke 30    age 44 months; residual right sided weakness  . History of blood transfusion 2012  . Charcot foot due to diabetes mellitus     chronic pain  . Osteoarthritis     right hip and shoulder  . COPD (chronic obstructive pulmonary disease)   . Myocardial infarction 2013b Jan  . PERIPHERAL VASCULAR INSUFFICIENCY,  LEGS, BILATERAL  08/17/2010    ABI's performed 04/19/11 R 0.72 L 0.71  . ANEMIA-IRON DEFICIENCY 09/23/2008    ulcer related last year  . Chronic edema  Chronic pedal edema    Past Surgical History  Procedure Laterality Date  . Carpal tunnel release      left  . Sigmoidoscopy  03/11/2001  . Venous doppler  01/30/2004  . Tonsillectomy and adenoidectomy      "as a kid"  . Cataract extraction w/ intraocular lens  implant, bilateral    . Coronary angioplasty with stent placement  08/2011    "1"  . Retinopathy surgery  2000's    "laser; both eyes"  . Wrist fusion  1976    right  . Anterior fusion cervical spine  2010    "C spine; Dr. Yetta Barre"  . Ankle fracture surgery  1976; ?date    LEFT:  fused; removed bulk of hardware  . Heel spur surgery  ~ 2007    ? left  . Irrigation and debridement knee  02/27/2012    Procedure: IRRIGATION AND DEBRIDEMENT KNEE;  Surgeon: Verlee Rossetti, MD;  Location: [facility] OR;  Service: Orthopedics;  Laterality: Right;  irrigation and drainage right knee septic bursitis  . Eye surgery  2008    cataract removal, cautery  . Coronary artery bypass graft  1998    CABG X5  . Fracture surgery      foot broken 1976    Family History  Problem Relation Age of Onset  . Heart disease Mother     Coronary Artery Disease  . Heart disease Father     Coronary Artery Disease  . Heart disease Paternal Grandmother     Coronary Artery Disease  . Colon cancer Neg Hx   . Diabetes Other     Grandmother    Social History:  reports that he has never smoked. He has never used smokeless tobacco. He reports that he does not drink alcohol or use illicit drugs.   Review of Systems: Constitutional:  denies fever, chills, diaphoresis, appetite change and fatigue.  HEENT: denies photophobia, eye pain, redness, hearing loss, ear pain, congestion, sore throat, rhinorrhea, sneezing, neck pain, neck stiffness and tinnitus.  Respiratory: admits to SOB, +  cough,  -  wheezing.  Cardiovascular:  denies chest pain, palpitations and leg swelling.  Gastrointestinal: denies nausea, vomiting, abdominal pain, diarrhea, constipation, blood in stool.  Genitourinary: denies dysuria, urgency, frequency, hematuria, flank pain and difficulty urinating.  Musculoskeletal: denies  myalgias, back pain, joint swelling, arthralgias and gait problem.   Skin: denies pallor, rash and wound.  Neurological: denies dizziness, seizures, syncope, weakness, light-headedness, numbness and headaches.   Hematological: denies adenopathy, easy bruising, personal or family bleeding history.  Psychiatric/ Behavioral: denies suicidal ideation, mood changes, confusion, nervousness, sleep disturbance and agitation.    Physical Exam: BP 111/38  Pulse 65  Temp(Src) 97.9 F (36.6 C) (Oral)  Resp 14  SpO2 100%  General: Vital signs reviewed and noted. Well-developed, well-nourished, in no acute distress; alert, appropriate and cooperative throughout examination.  Head: Normocephalic, atraumatic, sclera anicteric, mucus membranes are moist  Neck: Supple. Negative for carotid bruits. JVD not elevated.  Lungs:  Clear bilaterally to auscultation without wheezes, rales, or rhonchi. Breathing is unlabored.  Heart: RRR with S1 S2. No murmurs, rubs, or gallops appreciated.  Abdomen:  Soft, non-tender, non-distended with normoactive bowel sounds. No hepatomegaly. No rebound/guarding. No obvious abdominal masses  MSK: Strength and the appear normal for age.  Extremities: No clubbing or cyanosis. No edema.  Distal pedal pulses are 2+ and equal bilaterally.  Neurologic: Alert and oriented X 3. Moves all extremities spontaneously.  Psych: Responds  to questions appropriately with a normal affect.    Lab results: Basic Metabolic Panel:  Recent Labs Lab 02/21/13 0659  NA 135  K 3.9  CL 98  CO2 27  GLUCOSE 84  BUN 34*  CREATININE 1.51*  CALCIUM 9.3    Liver Function Tests: No results found for this basename: AST, ALT,  ALKPHOS, BILITOT, PROT, ALBUMIN,  in the last 168 hours No results found for this basename: LIPASE, AMYLASE,  in the last 168 hours No results found for this basename: AMMONIA,  in the last 168 hours  CBC:  Recent Labs Lab 02/21/13 0659  WBC 7.3  NEUTROABS 5.2  HGB 12.8*  HCT 38.3*  MCV 86.8  PLT 164    Cardiac Enzymes:  Recent Labs Lab 02/21/13 0701  TROPONINI <0.30    BNP: No components found with this basename: POCBNP,   CBG: No results found for this basename: GLUCAP,  in the last 168 hours  Coagulation Studies: No results found for this basename: LABPROT, INR,  in the last 72 hours   Other results:  ECG - NSR at 74.   Imaging: Dg Chest Portable 1 View  02/21/2013   *RADIOLOGY REPORT*  Clinical Data: Shortness of breath  PORTABLE CHEST - 1 VIEW  Comparison: 09/17/2012; 04/26/2012  Findings:  Grossly unchanged enlarged cardiac silhouette and mediastinal contours post median sternotomy and CABG.  Mild cephalization of flow without frank evidence of edema.  Unchanged bilateral infrahilar heterogeneous opacities favored to represent atelectasis.  Minimal left basilar/retrocardiac opacities favored to represent atelectasis.  No discrete focal airspace opacities. No definite pleural effusion or pneumothorax.  Grossly unchanged bones.  IMPRESSION: Pulmonary venous congestion without definite acute cardiopulmonary disease on this AP portable examination.  Further evaluation with a PA and lateral chest radiograph may be obtained as clinically indicated.   Original Report Authenticated By: Tacey Ruiz, MD       Assessment & Plan: 1.  Dyspnea:  Mr. Batdorf awoke with shortness of breath this am around 4 am.   He has not be careful with salt intake.  He ate some salty chicken last night.  He has chronic systolic CHF  I think he is mildly volume overloaded because of dietary indiscretion.  In addition, his COPD meds were changed this past week and he has not been using the  QVAR correctly.    Rec:  IV lasix here in the ER.  If he is able to put out some additional urine and if his next Troponin level is normal, he can go home.   DVT PPX -    Alvia Grove., MD, [facility] 02/21/2013, 9:48 AM

## 2013-02-21 NOTE — ED Notes (Signed)
Cardiology at the bedside.

## 2013-02-21 NOTE — H&P (Addendum)
[facility] History and Physical  Mark French:[id number] DOB: Jan 14, 1946 DOA: 02/21/2013  Referring physician: EDP PCP: [name], MD  Specialists:cards   Chief Complaint:shortness of breath  HPI: Mark French is a 67 y.o. male with PMH of DM, CABD, ICM EF of 45%, CKD 3, Asthma, obesity, Legally blind, PVD was in his usual state of health till yesterday. He woke up this morning around 4 AM with shortness of breath, tried using his inhaler but was still short of breath around 5 a.m. subsequently used his wife alert and called EMS. Dr. Elease Hashimoto of [facility]  cardiology saw the patient and recommended Lasix and eventually discharged later today from the ER, he clinically improved after diuretics but still hypoxic with activity to 79-80% on room air, and hence admitted. He reports that his wife is visiting [place] right now and his neighbor cooks for him and ate some salty chicken yesterday.    Review of Systems: The patient denies anorexia, fever, weight loss,, vision loss, decreased hearing, hoarseness, chest pain, syncope,  peripheral edema, balance deficits, hemoptysis, abdominal pain, melena, hematochezia, severe indigestion/heartburn, hematuria, incontinence, genital sores, muscle weakness, suspicious skin lesions, transient blindness, difficulty walking, depression, unusual weight change, abnormal bleeding, enlarged lymph nodes, angioedema, and breast masses.    Past Medical History  Diagnosis Date  . CORONARY ARTERY DISEASE 03/11/2007    s/p CABG 1998;  admitted 08/2011 with an inferior STEMI.  LHC 09/07/11: Severe  Three-vessel CAD, LIMA-LAD patent, SVG-RCA chronically occluded, SVG-circumflex chronically occluded, SVG-diagonal 95%.  PCI: Promus DES to the SVG-diagonal.  Procedure c/b transient CHB req CPR and TTVP;  myoview 4/13:large severe inferolateral infarct, no ischemia, EF 25%    . DIABETES MELLITUS, TYPE I 03/11/2007  . HYPERTENSION 03/11/2007  . Proliferative  diabetic retinopathy(362.02) 03/09/2009  . HYPERCHOLESTEROLEMIA 09/28/2008  . CERVICAL RADICULOPATHY, LEFT 09/23/2008  . PERIPHERAL NEUROPATHY 09/23/2008  . Chronic systolic heart failure 09/23/2008    Echocardiogram 09/07/11: Difficult study, inferior and posterior lateral wall hypokinesis, mild LVH, EF 45%, mild LAE.   [name] RENAL INSUFFICIENCY 03/15/2010  . ED (erectile dysfunction)   . Hypogonadism male   . GI bleed 2012    Multiple Duodenal ulcer   . Obesity   . Legally blind     bilaterally  . Stroke 31    age 69 months; residual right sided weakness  . History of blood transfusion 2012  . Charcot foot due to diabetes mellitus     chronic pain  . Osteoarthritis     right hip and shoulder  . COPD (chronic obstructive pulmonary disease)   . Myocardial infarction 2013b Jan  . PERIPHERAL VASCULAR INSUFFICIENCY,  LEGS, BILATERAL 08/17/2010    ABI's performed 04/19/11 R 0.72 L 0.71  . ANEMIA-IRON DEFICIENCY 09/23/2008    ulcer related last year  . Chronic edema     Chronic pedal edema   Past Surgical History  Procedure Laterality Date  . Carpal tunnel release      left  . Sigmoidoscopy  03/11/2001  . Venous doppler  01/30/2004  . Tonsillectomy and adenoidectomy      "as a kid"  . Cataract extraction w/ intraocular lens  implant, bilateral    . Coronary angioplasty with stent placement  08/2011    "1"  . Retinopathy surgery  2000's    "laser; both eyes"  . Wrist fusion  1976    right  . Anterior fusion cervical spine  2010    "C spine; Dr. Yetta Barre"  .  Ankle fracture surgery  1976; ?date    LEFT:  fused; removed bulk of hardware  . Heel spur surgery  ~ 2007    ? left  . Irrigation and debridement knee  02/27/2012    Procedure: IRRIGATION AND DEBRIDEMENT KNEE;  Surgeon: Verlee Rossetti, MD;  Location: [facility] OR;  Service: Orthopedics;  Laterality: Right;  irrigation and drainage right knee septic bursitis  . Eye surgery  2008    cataract removal, cautery  . Coronary artery bypass graft   1998    CABG X5  . Fracture surgery      foot broken 1976   Social History:  reports that he has never smoked. He has never used smokeless tobacco. He reports that he does not drink alcohol or use illicit drugs. Lives at home with spouse  Allergies  Allergen Reactions  . Sildenafil Nausea Only and Other (See Comments)    "took it once; heart went to pieces; never took it again"  . Percocet (Oxycodone-Acetaminophen) Other (See Comments)    Unknown reaction-possible altered mental state-per wife.    . Contrast Media (Iodinated Diagnostic Agents) Nausea Only    Family History  Problem Relation Age of Onset  . Heart disease Mother     Coronary Artery Disease  . Heart disease Father     Coronary Artery Disease  . Heart disease Paternal Grandmother     Coronary Artery Disease  . Colon cancer Neg Hx   . Diabetes Other     Grandmother    Prior to Admission medications   Medication Sig Start Date End Date Taking? Authorizing Provider  albuterol (PROVENTIL HFA;VENTOLIN HFA) 108 (90 BASE) MCG/ACT inhaler Inhale 2 puffs into the lungs every 6 (six) hours as needed. For shortness of breath   Yes Historical Provider, MD  albuterol (PROVENTIL) (2.5 MG/3ML) 0.083% nebulizer solution Take 3 mLs (2.5 mg total) by nebulization every 6 (six) hours as needed for wheezing. 02/12/13  Yes Kalman Shan, MD  aspirin 325 MG EC tablet Take 81 mg by mouth daily. Patient's wife cuts into fourths, gives @ 81mg  daily.   Yes Historical Provider, MD  atorvastatin (LIPITOR) 40 MG tablet Take 1 tablet (40 mg total) by mouth daily. 04/09/12 04/09/13 Yes Tonny Bollman, MD  beclomethasone (QVAR) 80 MCG/ACT inhaler Inhale 2 puffs into the lungs 2 (two) times daily. 02/12/13  Yes Kalman Shan, MD  carvedilol (COREG) 6.25 MG tablet Take 6.25 mg by mouth 2 (two) times daily with a meal.   Yes Historical Provider, MD  clopidogrel (PLAVIX) 75 MG tablet Take 1 tablet (75 mg total) by mouth daily with breakfast. 06/05/12  06/05/13 Yes [name], MD  clotrimazole (LOTRIMIN) 1 % cream Apply 1 application topically daily as needed (for rash around mid section).   Yes Historical Provider, MD  co-enzyme Q-10 30 MG capsule Take 30 mg by mouth daily.   Yes Historical Provider, MD  Docusate Sodium (STOOL SOFTENER) 100 MG capsule Take 100 mg by mouth daily as needed. constipation   Yes Historical Provider, MD  fentaNYL (DURAGESIC - DOSED MCG/HR) 25 MCG/HR Place 1 patch (25 mcg total) onto the skin every 3 (three) days. 01/17/13  Yes [name], MD  gabapentin (NEURONTIN) 600 MG tablet Take 1 tablet (600 mg total) by mouth 3 (three) times daily. 11/20/12 11/20/13 Yes [name], MD  HYDROcodone-acetaminophen (NORCO) 10-325 MG per tablet Take 1 tablet by mouth every 6 (six) hours as needed for pain. 01/17/13  Yes [name], MD  insulin NPH (HUMULIN N,NOVOLIN N) 100 UNIT/ML injection Inject 70 Units into the skin daily before breakfast.   Yes Historical Provider, MD  ipratropium (ATROVENT) 0.02 % nebulizer solution Take 2.5 mLs (500 mcg total) by nebulization 4 (four) times daily. 02/12/13  Yes Kalman Shan, MD  isosorbide mononitrate (IMDUR) 30 MG 24 hr tablet Take 1 tablet (30 mg total) by mouth daily. 11/20/12 11/20/13 Yes Tonny Bollman, MD  losartan (COZAAR) 50 MG tablet Take 50 mg by mouth daily.   Yes Historical Provider, MD  nitroGLYCERIN (NITROSTAT) 0.4 MG SL tablet Place 1 tablet (0.4 mg total) under the tongue every 5 (five) minutes as needed for chest pain. 10/29/12  Yes Tonny Bollman, MD  pantoprazole (PROTONIX) 40 MG tablet Take 40 mg by mouth daily.   Yes Historical Provider, MD  potassium chloride SA (K-DUR,KLOR-CON) 20 MEQ tablet Take 1 tablet (20 mEq total) by mouth 2 (two) times daily. 01/31/13  Yes Tonny Bollman, MD  silodosin (RAPAFLO) 8 MG CAPS capsule Take 8 mg by mouth daily with breakfast.   Yes Historical Provider, MD  spironolactone (ALDACTONE) 25 MG tablet Take 0.5 tablets (12.5 mg total) by mouth  daily. 11/20/12  Yes Tonny Bollman, MD  tiotropium (SPIRIVA) 18 MCG inhalation capsule Place 1 capsule (18 mcg total) into inhaler and inhale daily. 10/08/12 10/08/13 Yes Kalman Shan, MD  torsemide (DEMADEX) 20 MG tablet Take 3 tablets by mouth in the morning and 3 tablets in the afternoon around 2pm. 01/31/13  Yes Tonny Bollman, MD  Insulin Syringe-Needle U-100 (INSULIN SYRINGE 1CC/31GX5/16") 31G X 5/16" 1 ML MISC USE AS DIRECTED TO INJECT INSULIN FOUR TIMES DAILY 12/16/12   [name], MD   Physical Exam: Filed Vitals:   02/21/13 1044 02/21/13 1114 02/21/13 1115 02/21/13 1145  BP:  120/82 153/58 123/38  Pulse:   77 72  Temp:      TempSrc:      Resp:  14 15 18   SpO2: 96% 95% 97% 98%     General:  AAOx3, no distres  HEENT: PERRLA, EOMI, no JVD  Cardiovascular: S1S2/RRR  Respiratory: CTAB  Abdomen: soft, Nt, BS present  Skin: mild chronic erythema of lower ext  Musculoskeletal: no edema c/c  Psychiatric: appropriate mood and affect  Neurologic: non focal  Labs on Admission:  Basic Metabolic Panel:  Recent Labs Lab 02/21/13 0659  NA 135  K 3.9  CL 98  CO2 27  GLUCOSE 84  BUN 34*  CREATININE 1.51*  CALCIUM 9.3   Liver Function Tests: No results found for this basename: AST, ALT, ALKPHOS, BILITOT, PROT, ALBUMIN,  in the last 168 hours No results found for this basename: LIPASE, AMYLASE,  in the last 168 hours No results found for this basename: AMMONIA,  in the last 168 hours CBC:  Recent Labs Lab 02/21/13 0659  WBC 7.3  NEUTROABS 5.2  HGB 12.8*  HCT 38.3*  MCV 86.8  PLT 164   Cardiac Enzymes:  Recent Labs Lab 02/21/13 0701 02/21/13 1000  TROPONINI <0.30 <0.30    BNP (last 3 results)  Recent Labs  04/26/12 1944 08/02/12 1114  PROBNP 788.0* 248.0*   CBG:  Recent Labs Lab 02/21/13 1106  GLUCAP 72    Radiological Exams on Admission: Dg Chest Portable 1 View  02/21/2013   *RADIOLOGY REPORT*  Clinical Data: Shortness of breath   PORTABLE CHEST - 1 VIEW  Comparison: 09/17/2012; 04/26/2012  Findings:  Grossly unchanged enlarged cardiac silhouette and mediastinal contours post median sternotomy and  CABG.  Mild cephalization of flow without frank evidence of edema.  Unchanged bilateral infrahilar heterogeneous opacities favored to represent atelectasis.  Minimal left basilar/retrocardiac opacities favored to represent atelectasis.  No discrete focal airspace opacities. No definite pleural effusion or pneumothorax.  Grossly unchanged bones.  IMPRESSION: Pulmonary venous congestion without definite acute cardiopulmonary disease on this AP portable examination.  Further evaluation with a PA and lateral chest radiograph may be obtained as clinically indicated.   Original Report Authenticated By: Tacey Ruiz, MD    EKG: Independently reviewed. No acute St T wave changes  Assessment/Plan Active Problems:   Coronary atherosclerosis of autologous vein bypass graft   Chronic systolic heart failure   CEREBROVASCULAR ACCIDENT, HX OF   PERIPHERAL VASCULAR INSUFFICIENCY,  LEGS, BILATERAL   DM2 (diabetes mellitus, type 2)   Chronic kidney disease, stage III (moderate)   1. Acute on chronic systolic CHF -Lasix 40mg  Q12, change to PO quickly -continue coreg and ARB -last ECHO 3/14 with EF of 45-50% -I/Os, daily weights  2. CAD s/p CABG -stable, EKG stable and troponin negative x2 -continue ASA/plavix/coreg/imdur/statin  3. DM: -continue insulin, SSI  4. CKD 3: -stable -monitor electrolytes with diuresis  5. Asthma: -stable, continue albuterol, Qvar, Spiriva  DVt proph: SCDs  Code Status: DNR Family Communication: none at bedside Disposition Plan: Inpatient  Time spent:  [facility] Pager [phone]  If 7PM-7AM, please contact night-coverage [email or web address] Password [facility] 02/21/2013, 1:37 PM

## 2013-02-21 NOTE — ED Notes (Signed)
Hospitalist at the bedside 

## 2013-02-21 NOTE — ED Provider Notes (Signed)
History    CSN: [id number] Arrival date & time 02/21/13  0556  First MD Initiated Contact with Patient 02/21/13 [phone]     Chief Complaint  Patient presents with  . Shortness of Breath   (Consider location/radiation/quality/duration/timing/severity/associated sxs/prior Treatment) The history is provided by the patient.   67 year old male was awakened at about 4 AM with difficulty breathing and a tight feeling across his chest. He rated the tightness at 5/10. Nothing made it better and nothing made it worse. There is no associated nausea or diaphoresis. He he was given his morning dose of aspirin 81 mg and to single nitroglycerin and states he feels he is back to normal. He does not recall having any nocturnal symptoms in the past. He has not had any episodes of chest pain or dyspnea in the last several days other than that one episode woke him up this morning. Past Medical History  Diagnosis Date  . CORONARY ARTERY DISEASE 03/11/2007    s/p CABG 1998;  admitted 08/2011 with an inferior STEMI.  LHC 09/07/11: Severe  Three-vessel CAD, LIMA-LAD patent, SVG-RCA chronically occluded, SVG-circumflex chronically occluded, SVG-diagonal 95%.  PCI: Promus DES to the SVG-diagonal.  Procedure c/b transient CHB req CPR and TTVP;  myoview 4/13:large severe inferolateral infarct, no ischemia, EF 25%    . DIABETES MELLITUS, TYPE I 03/11/2007  . HYPERTENSION 03/11/2007  . 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 HYPERCHOLESTEROLEMIA 09/28/2008  . CERVICAL RADICULOPATHY, LEFT 09/23/2008  . PERIPHERAL NEUROPATHY 09/23/2008  . Chronic systolic heart failure 09/23/2008    Echocardiogram 09/07/11: Difficult study, inferior and posterior lateral wall hypokinesis, mild LVH, EF 45%, mild LAE.   [name] RENAL INSUFFICIENCY 03/15/2010  . ED (erectile dysfunction)   . Hypogonadism male   . GI bleed 2012    Multiple Duodenal ulcer   . Obesity   . Legally blind     bilaterally  . Stroke 27    age 31 months; residual  right sided weakness  . History of blood transfusion 2012  . Charcot foot due to diabetes mellitus     chronic pain  . Osteoarthritis     right hip and shoulder  . COPD (chronic obstructive pulmonary disease)   . Myocardial infarction 2013b Jan  . PERIPHERAL VASCULAR INSUFFICIENCY,  LEGS, BILATERAL 08/17/2010    ABI's performed 04/19/11 R 0.72 L 0.71  . ANEMIA-IRON DEFICIENCY 09/23/2008    ulcer related last year  . Chronic edema     Chronic pedal edema   Past Surgical History  Procedure Laterality Date  . Carpal tunnel release      left  . Sigmoidoscopy  03/11/2001  . Venous doppler  01/30/2004  . Tonsillectomy and adenoidectomy      "as a kid"  . Cataract extraction w/ intraocular lens  implant, bilateral    . Coronary angioplasty with stent placement  08/2011    "1"  . Retinopathy surgery  2000's    "laser; both eyes"  . Wrist fusion  1976    right  . Anterior fusion cervical spine  2010    "C spine; Dr. Yetta Barre"  . Ankle fracture surgery  1976; ?date    LEFT:  fused; removed bulk of hardware  . Heel spur surgery  ~ 2007    ? left  . Irrigation and debridement knee  02/27/2012    Procedure: IRRIGATION AND DEBRIDEMENT KNEE;  Surgeon: Verlee Rossetti, MD;  Location: [facility] OR;  Service: Orthopedics;  Laterality: Right;  irrigation and drainage  right knee septic bursitis  . Eye surgery  2008    cataract removal, cautery  . Coronary artery bypass graft  1998    CABG X5  . Fracture surgery      foot broken 1976   Family History  Problem Relation Age of Onset  . Heart disease Mother     Coronary Artery Disease  . Heart disease Father     Coronary Artery Disease  . Heart disease Paternal Grandmother     Coronary Artery Disease  . Colon cancer Neg Hx   . Diabetes Other     Grandmother   History  Substance Use Topics  . Smoking status: Never Smoker   . Smokeless tobacco: Never Used  . Alcohol Use: No    Review of Systems  All other systems reviewed and are  negative.    Allergies  Sildenafil; Percocet; and Contrast media  Home Medications   Current Outpatient Rx  Name  Route  Sig  Dispense  Refill  . albuterol (PROVENTIL HFA;VENTOLIN HFA) 108 (90 BASE) MCG/ACT inhaler   Inhalation   Inhale 2 puffs into the lungs every 6 (six) hours as needed. For shortness of breath         . albuterol (PROVENTIL) (2.5 MG/3ML) 0.083% nebulizer solution   Nebulization   Take 3 mLs (2.5 mg total) by nebulization every 6 (six) hours as needed for wheezing.   360 mL   12   . aspirin 325 MG EC tablet   Oral   Take 81 mg by mouth daily. Patient's wife cuts into fourths, gives @ 81mg  daily.         [name] atorvastatin (LIPITOR) 40 MG tablet   Oral   Take 1 tablet (40 mg total) by mouth daily.   30 tablet   11   . beclomethasone (QVAR) 80 MCG/ACT inhaler   Inhalation   Inhale 2 puffs into the lungs 2 (two) times daily.   1 Inhaler   6   . carvedilol (COREG) 6.25 MG tablet   Oral   Take 6.25 mg by mouth 2 (two) times daily with a meal.         . clopidogrel (PLAVIX) 75 MG tablet   Oral   Take 1 tablet (75 mg total) by mouth daily with breakfast.   90 tablet   3   . clotrimazole (LOTRIMIN) 1 % cream   Topical   Apply 1 application topically daily as needed (for rash around mid section).         [name] co-enzyme Q-10 30 MG capsule   Oral   Take 30 mg by mouth daily.         Tery Sanfilippo Sodium (STOOL SOFTENER) 100 MG capsule   Oral   Take 100 mg by mouth daily as needed. constipation         . fentaNYL (DURAGESIC - DOSED MCG/HR) 25 MCG/HR   Transdermal   Place 1 patch (25 mcg total) onto the skin every 3 (three) days.   10 patch   0   . gabapentin (NEURONTIN) 600 MG tablet   Oral   Take 1 tablet (600 mg total) by mouth 3 (three) times daily.   90 tablet   4   . HYDROcodone-acetaminophen (NORCO) 10-325 MG per tablet   Oral   Take 1 tablet by mouth every 6 (six) hours as needed for pain.   100 tablet   3   . insulin NPH  (HUMULIN N,NOVOLIN N) 100  UNIT/ML injection   Subcutaneous   Inject 70 Units into the skin daily before breakfast.         . ipratropium (ATROVENT) 0.02 % nebulizer solution   Nebulization   Take 2.5 mLs (500 mcg total) by nebulization 4 (four) times daily.   75 mL   12   . isosorbide mononitrate (IMDUR) 30 MG 24 hr tablet   Oral   Take 1 tablet (30 mg total) by mouth daily.   30 tablet   6   . losartan (COZAAR) 50 MG tablet   Oral   Take 50 mg by mouth daily.         . nitroGLYCERIN (NITROSTAT) 0.4 MG SL tablet   Sublingual   Place 1 tablet (0.4 mg total) under the tongue every 5 (five) minutes as needed for chest pain.   25 tablet   5   . pantoprazole (PROTONIX) 40 MG tablet   Oral   Take 40 mg by mouth daily.         . potassium chloride SA (K-DUR,KLOR-CON) 20 MEQ tablet   Oral   Take 1 tablet (20 mEq total) by mouth 2 (two) times daily.   60 tablet   6   . silodosin (RAPAFLO) 8 MG CAPS capsule   Oral   Take 8 mg by mouth daily with breakfast.         . spironolactone (ALDACTONE) 25 MG tablet   Oral   Take 0.5 tablets (12.5 mg total) by mouth daily.   30 tablet   6     pls disregard 45 day supply. Thanks [place] CMA   . tiotropium (SPIRIVA) 18 MCG inhalation capsule   Inhalation   Place 1 capsule (18 mcg total) into inhaler and inhale daily.   30 capsule   6   . torsemide (DEMADEX) 20 MG tablet      Take 3 tablets by mouth in the morning and 3 tablets in the afternoon around 2pm.   180 tablet   6   . Insulin Syringe-Needle U-100 (INSULIN SYRINGE 1CC/31GX5/16") 31G X 5/16" 1 ML MISC      USE AS DIRECTED TO INJECT INSULIN FOUR TIMES DAILY   120 each   3    BP 139/61  Temp(Src) 97.9 F (36.6 C) (Oral)  Resp 20  SpO2 100% Physical Exam  Nursing note and vitals reviewed.  67 year old male, resting comfortably and in no acute distress. Vital signs are normal. Oxygen saturation is 100%, which is normal. Head is normocephalic and  atraumatic. PERRLA, EOMI. Oropharynx is clear. Neck is nontender and supple without adenopathy or JVD. Back is nontender and there is no CVA tenderness. Lungs are clear without rales, wheezes, or rhonchi. Chest is nontender. Heart has regular rate and rhythm without murmur. Abdomen is soft, flat, nontender without masses or hepatosplenomegaly and peristalsis is normoactive. Extremities have no cyanosis or edema, full range of motion is present. Skin is warm and dry without rash. Neurologic: Mental status is normal, cranial nerves are intact, there are no motor or sensory deficits.  ED Course  Procedures (including critical care time) Results for orders placed during the hospital encounter of 02/21/13  CBC WITH DIFFERENTIAL      Result Value Range   WBC 7.3  4.0 - 10.5 K/uL   RBC 4.41  4.22 - 5.81 MIL/uL   Hemoglobin 12.8 (*) 13.0 - 17.0 g/dL   HCT 14.7 (*) 82.9 - 56.2 %   MCV 86.8  78.0 - 100.0 fL   MCH 29.0  26.0 - 34.0 pg   MCHC 33.4  30.0 - 36.0 g/dL   RDW 16.1  09.6 - 04.5 %   Platelets 164  150 - 400 K/uL   Neutrophils Relative % 71  43 - 77 %   Neutro Abs 5.2  1.7 - 7.7 K/uL   Lymphocytes Relative 15  12 - 46 %   Lymphs Abs 1.1  0.7 - 4.0 K/uL   Monocytes Relative 9  3 - 12 %   Monocytes Absolute 0.7  0.1 - 1.0 K/uL   Eosinophils Relative 5  0 - 5 %   Eosinophils Absolute 0.3  0.0 - 0.7 K/uL   Basophils Relative 0  0 - 1 %   Basophils Absolute 0.0  0.0 - 0.1 K/uL  BASIC METABOLIC PANEL      Result Value Range   Sodium 135  135 - 145 mEq/L   Potassium 3.9  3.5 - 5.1 mEq/L   Chloride 98  96 - 112 mEq/L   CO2 27  19 - 32 mEq/L   Glucose, Bld 84  70 - 99 mg/dL   BUN 34 (*) 6 - 23 mg/dL   Creatinine, Ser 4.09 (*) 0.50 - 1.35 mg/dL   Calcium 9.3  8.4 - 81.1 mg/dL   GFR calc non Af Amer 45 (*) >90 mL/min   GFR calc Af Amer 53 (*) >90 mL/min  TROPONIN I      Result Value Range   Troponin I <0.30  <0.30 ng/mL   Dg Chest Portable 1 View  02/21/2013   *RADIOLOGY REPORT*   Clinical Data: Shortness of breath  PORTABLE CHEST - 1 VIEW  Comparison: 09/17/2012; 04/26/2012  Findings:  Grossly unchanged enlarged cardiac silhouette and mediastinal contours post median sternotomy and CABG.  Mild cephalization of flow without frank evidence of edema.  Unchanged bilateral infrahilar heterogeneous opacities favored to represent atelectasis.  Minimal left basilar/retrocardiac opacities favored to represent atelectasis.  No discrete focal airspace opacities. No definite pleural effusion or pneumothorax.  Grossly unchanged bones.  IMPRESSION: Pulmonary venous congestion without definite acute cardiopulmonary disease on this AP portable examination.  Further evaluation with a PA and lateral chest radiograph may be obtained as clinically indicated.   Original Report Authenticated By: Tacey Ruiz, MD     Date: 02/21/2013  Rate: 75  Rhythm: normal sinus rhythm  QRS Axis: left  Intervals: PR prolonged  ST/T Wave abnormalities: normal  Conduction Disutrbances:right bundle branch block and left anterior fascicular block  Narrative Interpretation: First degree AV block, right bundle branch block, left anterior fascicular block. When compared with ECG of 10/15/2012, no significant changes are seen.  Old EKG Reviewed: unchanged   1. Chest pain   2. Dyspnea   3. Renal insufficiency   4. Anemia     MDM  Chest pain and dyspnea which seems consistent with an episode of angina. Patient states that he had rosemary for the first time yesterday and wonders if that may have caused his symptoms but I advised him that this was unlikely. He is not had any symptoms suggestive of unstable angina other than the fact that this episode was nocturnal. He does not have any ECG changes. Cardiac markers will be checked. At the very least, he would need to have a three-hour and six-hour troponin before he could be considered for discharge. Case will also be discussed with his cardiologist.  Dione Booze,  MD 02/21/13 [phone]

## 2013-02-21 NOTE — ED Provider Notes (Signed)
Difficulty breathing with chest tightness started last night. History of CAD. EKG and troponin unchanged. Per Dr. Preston Fleeting signout, will obtain serum troponins and discussed with cardiology.  Troponins remain negative. Cardiology has seen patient and recommended IV Lasix. They do not think his chest pain is cardiac in origin. Patient remains hypoxic and ambulation despite nebulizers and Lasix and dropping her sats to 70% with ambulation Given his hypoxia he'll need admission for recurrent acute and chronic systolic heart failure with possible superimposed COPD.  Glynn Octave, MD 02/21/13 [phone]

## 2013-02-21 NOTE — ED Notes (Signed)
While ambulating pt pulse ranged from 77 to 82 and oxygen ranged from 77 to 90 but was usually around 85.

## 2013-02-21 NOTE — ED Notes (Signed)
Pt states he wanted to wait to see what the disposition was going to be before getting the morphine. Would rather not get it at this time.

## 2013-02-22 DIAGNOSIS — R079 Chest pain, unspecified: Secondary | ICD-10-CM

## 2013-02-22 LAB — BASIC METABOLIC PANEL
BUN: 32 mg/dL — ABNORMAL HIGH (ref 6–23)
CO2: 29 mEq/L (ref 19–32)
Calcium: 9.1 mg/dL (ref 8.4–10.5)
Chloride: 97 mEq/L (ref 96–112)
Creatinine, Ser: 1.36 mg/dL — ABNORMAL HIGH (ref 0.50–1.35)
GFR calc Af Amer: 60 mL/min — ABNORMAL LOW (ref >90)

## 2013-02-22 LAB — GLUCOSE, CAPILLARY
Glucose-Capillary: 156 mg/dL — ABNORMAL HIGH (ref 70–99)
Glucose-Capillary: 129 mg/dL — ABNORMAL HIGH (ref 70–99)

## 2013-02-22 LAB — CBC
HCT: 38.6 % — ABNORMAL LOW (ref 39.0–52.0)
MCH: 28.7 pg (ref 26.0–34.0)
MCV: 86.5 fL (ref 78.0–100.0)
RDW: 15 % (ref 11.5–15.5)
WBC: 8.9 10*3/uL (ref 4.0–10.5)

## 2013-02-22 NOTE — Progress Notes (Signed)
Patient ID: Mark French, male   DOB: 08-Jun-1946, 67 y.o.   MRN: [id number]   Patient Name: Mark French Date of Encounter: 02/22/2013    SUBJECTIVE  No shortness of breath this morning. He is diuresed over 2 L. Anxious to go home.weight is back to baseline weight at home.  CURRENT MEDS . aspirin  81 mg Oral Daily  . atorvastatin  40 mg Oral Daily  . carvedilol  6.25 mg Oral BID WC  . clopidogrel  75 mg Oral Q breakfast  . enoxaparin (LOVENOX) injection  30 mg Subcutaneous Q24H  . fentaNYL  25 mcg Transdermal Q72H  . fluticasone  2 puff Inhalation BID  . furosemide  40 mg Intravenous Q12H  . gabapentin  600 mg Oral TID  . insulin aspart  0-15 Units Subcutaneous TID WC  . insulin NPH  60 Units Subcutaneous QAC breakfast  . isosorbide mononitrate  30 mg Oral Daily  . losartan  50 mg Oral Daily  . pantoprazole  40 mg Oral Daily  . potassium chloride SA  20 mEq Oral BID  . tamsulosin  0.4 mg Oral Daily  . tiotropium  18 mcg Inhalation Daily    OBJECTIVE  Filed Vitals:   02/21/13 1145 02/21/13 1404 02/21/13 2021 02/22/13 0420  BP: 123/38 170/84 107/46 120/63  Pulse: 72 74 78 57  Temp:  98.1 F (36.7 C) 97.5 F (36.4 C) 97.9 F (36.6 C)  TempSrc:  Oral Oral Oral  Resp: 18 18 18 19   Weight:    234 lb (106.142 kg)  SpO2: 98% 99% 97% 98%    Intake/Output Summary (Last 24 hours) at 02/22/13 0851 Last data filed at 02/22/13 0618  Gross per 24 hour  Intake    600 ml  Output   2750 ml  Net  -2150 ml   Filed Weights   02/22/13 0420  Weight: 234 lb (106.142 kg)    PHYSICAL EXAM  General: Pleasant, NAD. Neuro: Alert and oriented X 3. Moves all extremities spontaneously. Psych: Normal affect. HEENT:  Normal  Neck: Supple without bruits or JVD. Lungs:  Resp regular and unlabored, CTA. Heart: RRR no s3, s4, or murmurs. Abdomen: Soft, non-tender, non-distended, BS + x 4.  Extremities: No clubbing, cyanosis or edema. DP/PT/Radials 2+ and equal  bilaterally.  Accessory Clinical Findings  CBC  Recent Labs  02/21/13 0659 02/22/13 0355  WBC 7.3 8.9  NEUTROABS 5.2  --   HGB 12.8* 12.8*  HCT 38.3* 38.6*  MCV 86.8 86.5  PLT 164 162   Basic Metabolic Panel  Recent Labs  02/21/13 0659 02/22/13 0355  NA 135 134*  K 3.9 4.5  CL 98 97  CO2 27 29  GLUCOSE 84 205*  BUN 34* 32*  CREATININE 1.51* 1.36*  CALCIUM 9.3 9.1   Liver Function Tests No results found for this basename: AST, ALT, ALKPHOS, BILITOT, PROT, ALBUMIN,  in the last 72 hours No results found for this basename: LIPASE, AMYLASE,  in the last 72 hours Cardiac Enzymes  Recent Labs  02/21/13 0701 02/21/13 1000 02/21/13 1317  TROPONINI <0.30 <0.30 <0.30   BNP No components found with this basename: POCBNP,  D-Dimer No results found for this basename: DDIMER,  in the last 72 hours Hemoglobin A1C No results found for this basename: HGBA1C,  in the last 72 hours Fasting Lipid Panel No results found for this basename: CHOL, HDL, LDLCALC, TRIG, CHOLHDL, LDLDIRECT,  in the last 72 hours Thyroid Function Tests No  results found for this basename: TSH, T4TOTAL, FREET3, T3FREE, THYROIDAB,  in the last 72 hours  TELE  Normal sinus rhythm  ECG    Radiology/Studies  Dg Chest Portable 1 View  02/21/2013   *RADIOLOGY REPORT*  Clinical Data: Shortness of breath  PORTABLE CHEST - 1 VIEW  Comparison: 09/17/2012; 04/26/2012  Findings:  Grossly unchanged enlarged cardiac silhouette and mediastinal contours post median sternotomy and CABG.  Mild cephalization of flow without frank evidence of edema.  Unchanged bilateral infrahilar heterogeneous opacities favored to represent atelectasis.  Minimal left basilar/retrocardiac opacities favored to represent atelectasis.  No discrete focal airspace opacities. No definite pleural effusion or pneumothorax.  Grossly unchanged bones.  IMPRESSION: Pulmonary venous congestion without definite acute cardiopulmonary disease on this  AP portable examination.  Further evaluation with a PA and lateral chest radiograph may be obtained as clinically indicated.   Original Report Authenticated By: Tacey Ruiz, MD    ASSESSMENT AND PLAN  Active Problems:   Coronary atherosclerosis of autologous vein bypass graft   Chronic systolic heart failure   CEREBROVASCULAR ACCIDENT, HX OF   PERIPHERAL VASCULAR INSUFFICIENCY,  LEGS, BILATERAL   DM2 (diabetes mellitus, type 2)   Chronic kidney disease, stage III (moderate)    Diuresed 2 L and back to baseline weight. Has home health monitoring weight on a daily basis. Have discussed with Dr. Thedore Mins and will discharge home today. No change in her regimen. We'll give him 40 mg of IV Lasix this morning prior to discharge. Followup as planned and scheduled.  Signed, Valera Castle MD

## 2013-02-22 NOTE — Discharge Summary (Signed)
[facility]                                                                                   Mark French, is a 67 y.o. male  DOB August 12, 1945  MRN [id number].  Admission date:  02/21/2013  Discharge Date:  02/22/2013  Primary MD  [name], MD  Admitting Physician  Zannie Cove, MD  Admission Diagnosis  Chronic systolic heart failure [428.22] Dyspnea [786.09] Anemia [285.9] Renal insufficiency [593.9] Chest pain [786.50]  Discharge Diagnosis     Active Problems:   Coronary atherosclerosis of autologous vein bypass graft   Chronic systolic heart failure   CEREBROVASCULAR ACCIDENT, HX OF   PERIPHERAL VASCULAR INSUFFICIENCY,  LEGS, BILATERAL   DM2 (diabetes mellitus, type 2)   Chronic kidney disease, stage III (moderate)      Past Medical History  Diagnosis Date  . CORONARY ARTERY DISEASE 03/11/2007    s/p CABG 1998;  admitted 08/2011 with an inferior STEMI.  LHC 09/07/11: Severe  Three-vessel CAD, LIMA-LAD patent, SVG-RCA chronically occluded, SVG-circumflex chronically occluded, SVG-diagonal 95%.  PCI: Promus DES to the SVG-diagonal.  Procedure c/b transient CHB req CPR and TTVP;  myoview 4/13:large severe inferolateral infarct, no ischemia, EF 25%    . DIABETES MELLITUS, TYPE I 03/11/2007  . HYPERTENSION 03/11/2007  . 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 HYPERCHOLESTEROLEMIA 09/28/2008  . CERVICAL RADICULOPATHY, LEFT 09/23/2008  . PERIPHERAL NEUROPATHY 09/23/2008  . Chronic systolic heart failure 09/23/2008    Echocardiogram 09/07/11: Difficult study, inferior and posterior lateral wall hypokinesis, mild LVH, EF 45%, mild LAE.   [name] RENAL INSUFFICIENCY 03/15/2010  . ED (erectile dysfunction)   . Hypogonadism male   . GI bleed 2012    Multiple Duodenal ulcer   . Obesity   . Legally blind     bilaterally  . Stroke 33    age 53 months; residual right sided weakness  . History of blood transfusion 2012  . Charcot foot due to diabetes mellitus    chronic pain  . Osteoarthritis     right hip and shoulder  . COPD (chronic obstructive pulmonary disease)   . Myocardial infarction 2013b Jan  . PERIPHERAL VASCULAR INSUFFICIENCY,  LEGS, BILATERAL 08/17/2010    ABI's performed 04/19/11 R 0.72 L 0.71  . ANEMIA-IRON DEFICIENCY 09/23/2008    ulcer related last year  . Chronic edema     Chronic pedal edema    Past Surgical History  Procedure Laterality Date  . Carpal tunnel release      left  . Sigmoidoscopy  03/11/2001  . Venous doppler  01/30/2004  . Tonsillectomy and adenoidectomy      "as a kid"  . Cataract extraction w/ intraocular lens  implant, bilateral    . Coronary angioplasty with stent placement  08/2011    "1"  . Retinopathy surgery  2000's    "laser; both eyes"  . Wrist fusion  1976    right  . Anterior fusion cervical spine  2010    "C spine; Dr. Yetta Barre"  . Ankle fracture surgery  1976; ?date    LEFT:  fused; removed bulk of hardware  . Heel spur surgery  ~  2007    ? left  . Irrigation and debridement knee  02/27/2012    Procedure: IRRIGATION AND DEBRIDEMENT KNEE;  Surgeon: Verlee Rossetti, MD;  Location: [facility];  Service: Orthopedics;  Laterality: Right;  irrigation and drainage right knee septic bursitis  . Eye surgery  2008    cataract removal, cautery  . Coronary artery bypass graft  1998    CABG X5  . Fracture surgery      foot broken 1976     Recommendations for primary care physician for things to follow:      Discharge Diagnoses:   Active Problems:   Coronary atherosclerosis of autologous vein bypass graft   Chronic systolic heart failure   CEREBROVASCULAR ACCIDENT, HX OF   PERIPHERAL VASCULAR INSUFFICIENCY,  LEGS, BILATERAL   DM2 (diabetes mellitus, type 2)   Chronic kidney disease, stage III (moderate)    Discharge Condition: stable   Diet recommendation: See Discharge Instructions below   ConsultsCardiology    History of present illness and  Hospital Course:     Kindly see H&P  for history of present illness and admission details, please review complete Labs, Consult reports and Test reports for all details in brief Mark French, is a 67 y.o. male, patient with history of combined chronic systolic and diastolic heart failure, EF now improved in March of 2014-50%, has mild LVH, chronic kidney disease stage IV baseline creatinine around 1.6, hypertension, type 2 diabetes mellitus, history of CVA, history of CABG, who was in his usual state of health until 2 days ago when he had a meal which was very high in salt, prepared by his neighbor. Subsequently he experienced shortness of breath and presented to the ER where he was diagnosed with acute on chronic combined systolic and diastolic heart failure, he was placed on IV diuretics with good effect, this morning his weight is back to his baseline and he is completely symptom free. He was seen by Dr. Daleen Squibb cardiologist who is in agreement that patient can be discharged home on his home regimen, patient has agreed to be more vigilant and compliant with his diet at home.     For his diabetes mellitus we will continue his home regimen of insulin, he will continue his home dose aspirin, Plavix, Coreg, 6 Imdur and statin for his CAD. He has been chest pain-free throughout his admission.   Chronic kidney disease stage IV is at baseline all somewhat better with diuresis and CHF compensation after diuresis.    He has been requested to follow with his primary care physician and primary cardiologist within a week post discharge.     Today   Subjective:   Mark French today has no headache,no chest abdominal pain,no new weakness tingling or numbness, feels much better wants to go home today.   Objective:   Blood pressure 120/63, pulse 57, temperature 97.9 F (36.6 C), temperature source Oral, resp. rate 19, weight 106.142 kg (234 lb), SpO2 98.00%.   Intake/Output Summary (Last 24 hours) at 02/22/13 0951 Last data filed at  02/22/13 0618  Gross per 24 hour  Intake    600 ml  Output   2750 ml  Net  -2150 ml    Exam Awake Alert, Oriented *3, No new F.N deficits, Normal affect [employer].AT,PERRAL Supple Neck,No JVD, No cervical lymphadenopathy appriciated.  Symmetrical Chest wall movement, Good air movement bilaterally, CTAB RRR,No Gallops,Rubs or new Murmurs, No Parasternal Heave +ve B.Sounds, Abd Soft, Non tender, No organomegaly appriciated,  No rebound -guarding or rigidity. No Cyanosis, Clubbing or edema, No new Rash or bruise  Data Review   Major procedures and Radiology Reports - PLEASE review detailed and final reports for all details in brief -       Dg Chest Portable 1 View  02/21/2013   *RADIOLOGY REPORT*  Clinical Data: Shortness of breath  PORTABLE CHEST - 1 VIEW  Comparison: 09/17/2012; 04/26/2012  Findings:  Grossly unchanged enlarged cardiac silhouette and mediastinal contours post median sternotomy and CABG.  Mild cephalization of flow without frank evidence of edema.  Unchanged bilateral infrahilar heterogeneous opacities favored to represent atelectasis.  Minimal left basilar/retrocardiac opacities favored to represent atelectasis.  No discrete focal airspace opacities. No definite pleural effusion or pneumothorax.  Grossly unchanged bones.  IMPRESSION: Pulmonary venous congestion without definite acute cardiopulmonary disease on this AP portable examination.  Further evaluation with a PA and lateral chest radiograph may be obtained as clinically indicated.   Original Report Authenticated By: Tacey Ruiz, MD    Micro Results      No results found for this or any previous visit (from the past 240 hour(s)).   CBC w Diff: Lab Results  Component Value Date   WBC 8.9 02/22/2013   HGB 12.8* 02/22/2013   HCT 38.6* 02/22/2013   PLT 162 02/22/2013   LYMPHOPCT 15 02/21/2013   MONOPCT 9 02/21/2013   EOSPCT 5 02/21/2013   BASOPCT 0 02/21/2013    CMP: Lab Results  Component Value Date   NA 134*  02/22/2013   K 4.5 02/22/2013   CL 97 02/22/2013   CO2 29 02/22/2013   BUN 32* 02/22/2013   CREATININE 1.36* 02/22/2013   PROT 6.6 10/16/2012   ALBUMIN 3.1* 10/16/2012   BILITOT 0.4 10/16/2012   ALKPHOS 71 10/16/2012   AST 15 10/16/2012   ALT 13 10/16/2012  .   Discharge Instructions     Follow with Primary MD [name], MD in 7 days   Get CBC, CMP, checked 7 days by Primary MD and again as instructed by your Primary MD.    Get Medicines reviewed and adjusted.  Please request your Prim.MD to go over all Hospital Tests and Procedure/Radiological results at the follow up, please get all Hospital records sent to your Prim MD by signing hospital release before you go home.  Activity: As tolerated with Full fall precautions use walker/cane & assistance as needed   Diet:  Heart Healthy, Check your Weight same time everyday, if you gain over 2 pounds, or you develop in leg swelling, experience more shortness of breath or chest pain, call your Primary MD immediately. Follow Cardiac Low Salt Diet and 1.8 lit/day fluid restriction.   Disposition Home  If you experience worsening of your admission symptoms, develop shortness of breath, life threatening emergency, suicidal or homicidal thoughts you must seek medical attention immediately by calling 911 or calling your MD immediately  if symptoms less severe.  You Must read complete instructions/literature along with all the possible adverse reactions/side effects for all the Medicines you take and that have been prescribed to you. Take any new Medicines after you have completely understood and accpet all the possible adverse reactions/side effects.   Do not drive and provide baby sitting services if your were admitted for syncope or siezures until you have seen by Primary MD or a Neurologist and advised to do so again.  Do not drive when taking Pain medications.    Do not take more than  prescribed Pain, Sleep and Anxiety Medications  Special  Instructions: If you have smoked or chewed Tobacco  in the last 2 yrs please stop smoking, stop any regular Alcohol  and or any Recreational drug use.  Wear Seat belts while driving.   Please note  You were cared for by a hospitalist during your hospital stay. If you have any questions about your discharge medications or the care you received while you were in the hospital after you are discharged, you can call the unit and asked to speak with the hospitalist on call if the hospitalist that took care of you is not available. Once you are discharged, your primary care physician will handle any further medical issues. Please note that NO REFILLS for any discharge medications will be authorized once you are discharged, as it is imperative that you return to your primary care physician (or establish a relationship with a primary care physician if you do not have one) for your aftercare needs so that they can reassess your need for medications and monitor your lab values.    Follow-up Information   Follow up with [name], MD. Schedule an appointment as soon as possible for a visit in 3 days.   Contact information:   [address] [place] [address] [phone]       Follow up with Valera Castle, MD. Schedule an appointment as soon as possible for a visit in 2 weeks.   Contact information:   [address] [place] [address] [phone]         Discharge Medications     Medication List         albuterol (2.5 MG/3ML) 0.083% nebulizer solution  Commonly known as:  PROVENTIL  Take 3 mLs (2.5 mg total) by nebulization every 6 (six) hours as needed for wheezing.     albuterol 108 (90 BASE) MCG/ACT inhaler  Commonly known as:  PROVENTIL HFA;VENTOLIN HFA  Inhale 2 puffs into the lungs every 6 (six) hours as needed. For shortness of breath     aspirin 325 MG EC tablet  Take 81 mg by mouth daily. Patient's wife cuts into fourths, gives @ 81mg   daily.     atorvastatin 40 MG tablet  Commonly known as:  LIPITOR  Take 1 tablet (40 mg total) by mouth daily.     beclomethasone 80 MCG/ACT inhaler  Commonly known as:  QVAR  Inhale 2 puffs into the lungs 2 (two) times daily.     carvedilol 6.25 MG tablet  Commonly known as:  COREG  Take 6.25 mg by mouth 2 (two) times daily with a meal.     clopidogrel 75 MG tablet  Commonly known as:  PLAVIX  Take 1 tablet (75 mg total) by mouth daily with breakfast.     clotrimazole 1 % cream  Commonly known as:  LOTRIMIN  Apply 1 application topically daily as needed (for rash around mid section).     co-enzyme Q-10 30 MG capsule  Take 30 mg by mouth daily.     fentaNYL 25 MCG/HR  Commonly known as:  DURAGESIC - dosed mcg/hr  Place 1 patch (25 mcg total) onto the skin every 3 (three) days.     gabapentin 600 MG tablet  Commonly known as:  NEURONTIN  Take 1 tablet (600 mg total) by mouth 3 (three) times daily.     HYDROcodone-acetaminophen 10-325 MG per tablet  Commonly known as:  NORCO  Take 1  tablet by mouth every 6 (six) hours as needed for pain.     insulin NPH 100 UNIT/ML injection  Commonly known as:  HUMULIN N,NOVOLIN N  Inject 70 Units into the skin daily before breakfast.     INSULIN SYRINGE 1CC/31GX5/16" 31G X 5/16" 1 ML Misc  USE AS DIRECTED TO INJECT INSULIN FOUR TIMES DAILY     ipratropium 0.02 % nebulizer solution  Commonly known as:  ATROVENT  Take 2.5 mLs (500 mcg total) by nebulization 4 (four) times daily.     isosorbide mononitrate 30 MG 24 hr tablet  Commonly known as:  IMDUR  Take 1 tablet (30 mg total) by mouth daily.     losartan 50 MG tablet  Commonly known as:  COZAAR  Take 50 mg by mouth daily.     nitroGLYCERIN 0.4 MG SL tablet  Commonly known as:  NITROSTAT  Place 1 tablet (0.4 mg total) under the tongue every 5 (five) minutes as needed for chest pain.     pantoprazole 40 MG tablet  Commonly known as:  PROTONIX  Take 40 mg by mouth daily.      potassium chloride SA 20 MEQ tablet  Commonly known as:  K-DUR,KLOR-CON  Take 1 tablet (20 mEq total) by mouth 2 (two) times daily.     RAPAFLO 8 MG Caps capsule  Generic drug:  silodosin  Take 8 mg by mouth daily with breakfast.     spironolactone 25 MG tablet  Commonly known as:  ALDACTONE  Take 0.5 tablets (12.5 mg total) by mouth daily.     STOOL SOFTENER 100 MG capsule  Generic drug:  Docusate Sodium  Take 100 mg by mouth daily as needed. constipation     tiotropium 18 MCG inhalation capsule  Commonly known as:  SPIRIVA  Place 1 capsule (18 mcg total) into inhaler and inhale daily.     torsemide 20 MG tablet  Commonly known as:  DEMADEX  Take 3 tablets by mouth in the morning and 3 tablets in the afternoon around 2pm.           Total Time in preparing paper work, data evaluation and todays exam - 35 minutes  Leroy Sea M.D on 02/22/2013 at 9:51 AM  [facility] Office  [phone]

## 2013-02-26 IMAGING — CT CT HEAD W/O CM
3 of 4 series · 16 of 47 positions shown, 19 images · non-contrast
Comparison: Multiple exams, including 08/22/2011

CT HEAD

CLINICAL DATA: Weakness.  Slurred speech.

CT HEAD WITHOUT CONTRAST
CT CERVICAL SPINE WITHOUT CONTRAST
TECHNIQUE: Multidetector CT imaging of the head and cervical spine
was performed following the standard protocol without intravenous
contrast.  Multiplanar CT image reconstructions of the cervical
spine were also generated.

[Series 3: soft tissue · axial · 0.30mm/px · z∈[+871,+1027]mm · 10 of 92 slices shown, 13 images]
[im 7/92  brain]
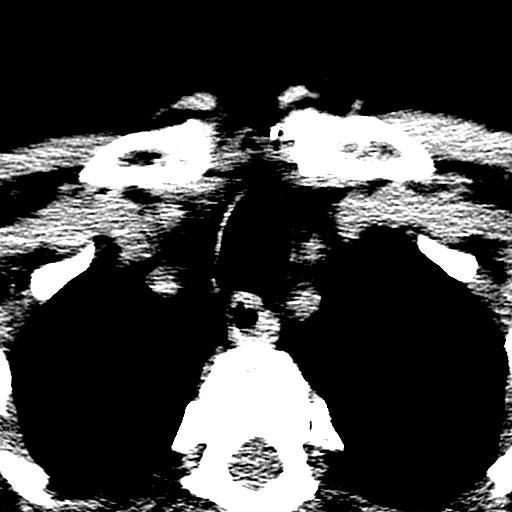
[im 7/92  bone]
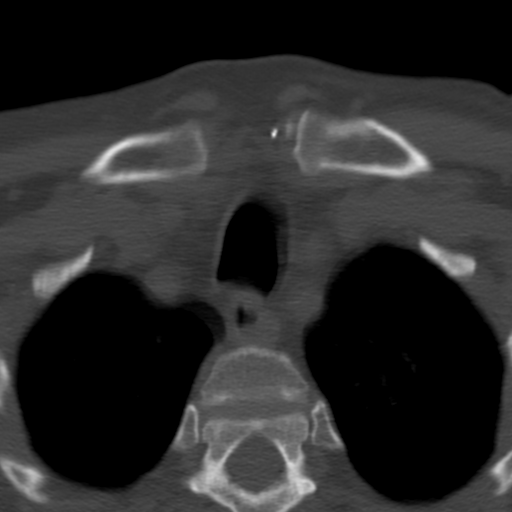
[im 14/92  brain]
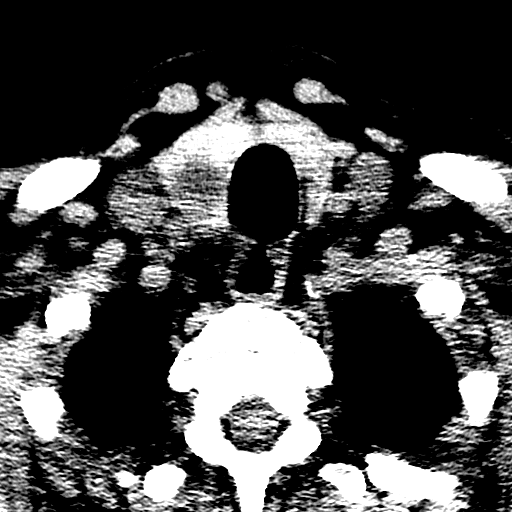
[im 27/92  brain]
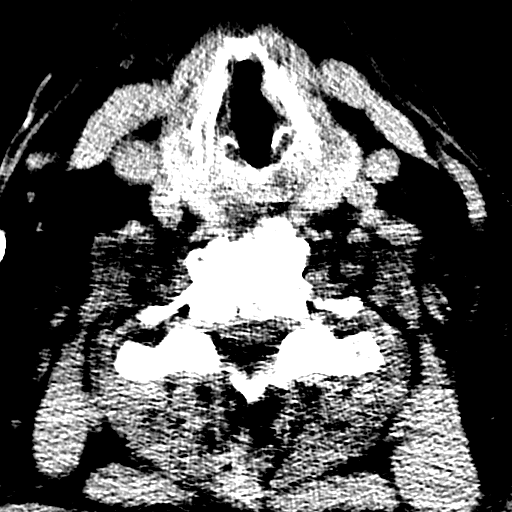
[im 33/92  brain]
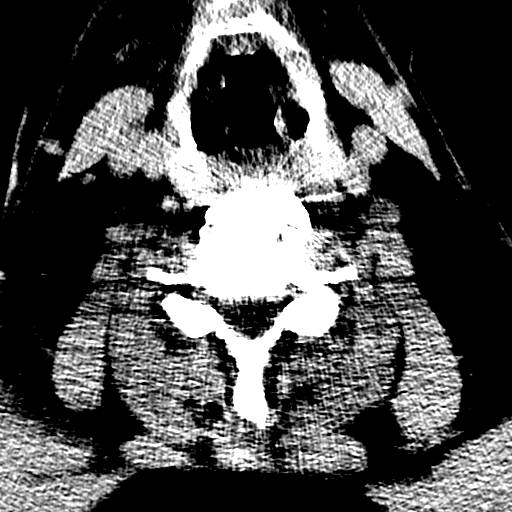
[im 40/92  brain]
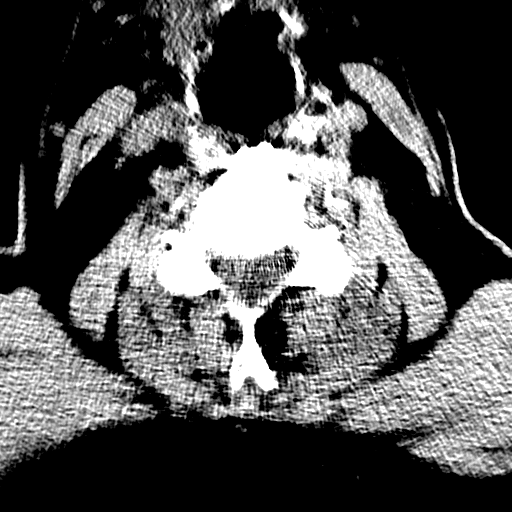
[im 40/92  bone]
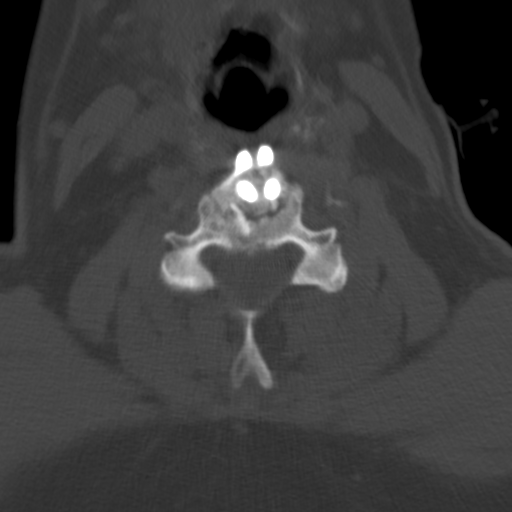
[im 53/92  brain]
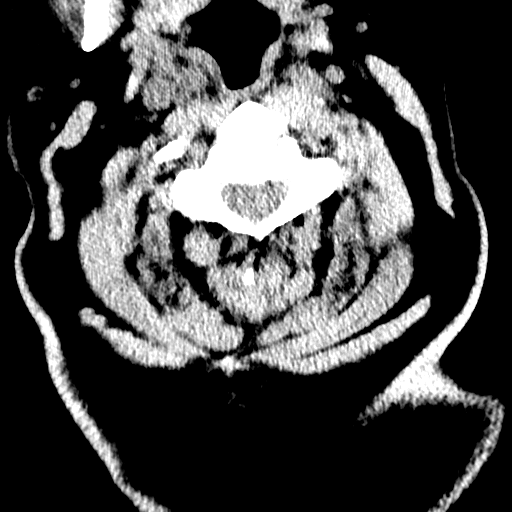
[im 59/92  brain]
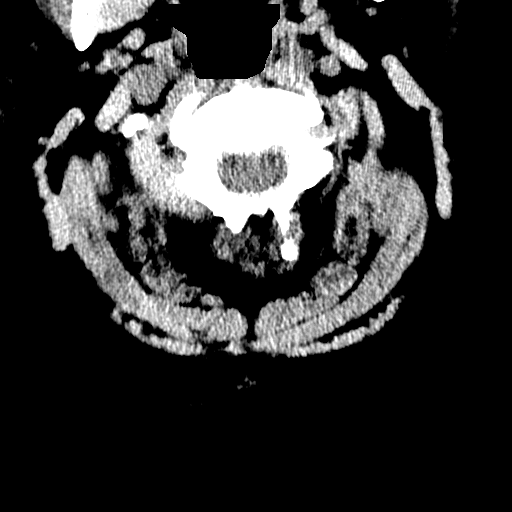
[im 66/92  brain]
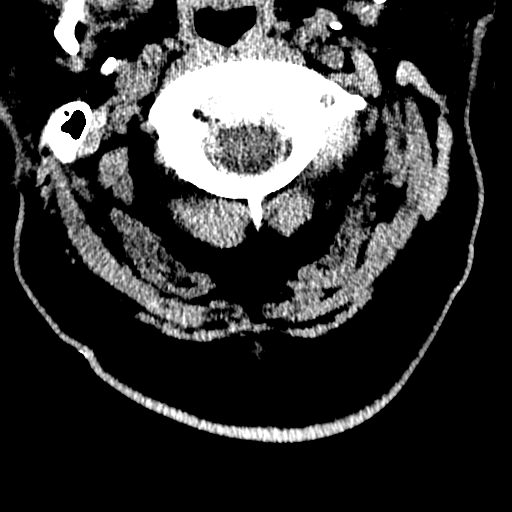
[im 79/92  brain]
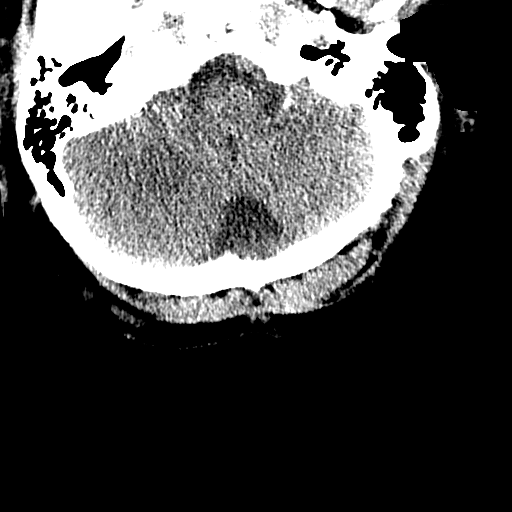
[im 79/92  bone]
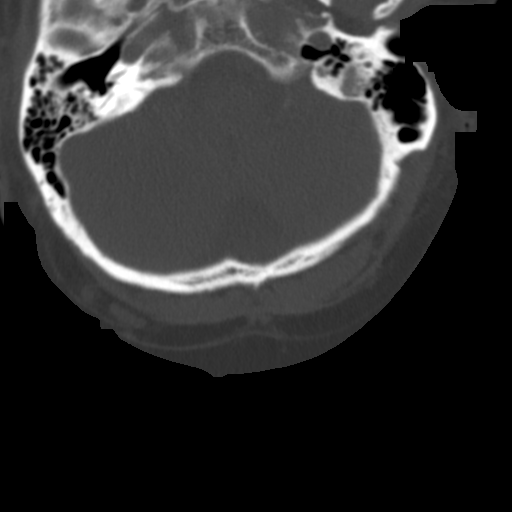
[im 85/92  brain]
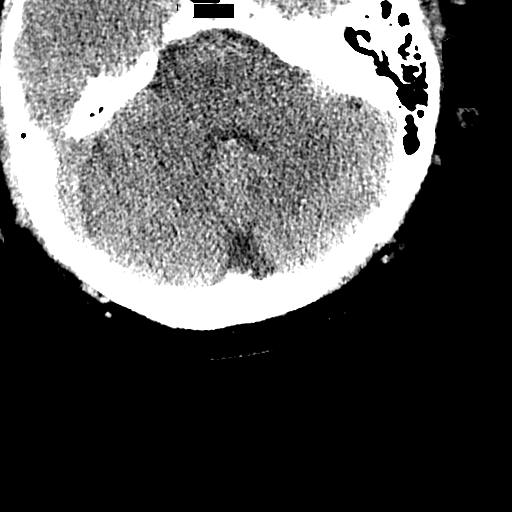

[coronal · coronal · 0.36mm/px · 3 of 40 slices shown]
[im 14/40  brain]
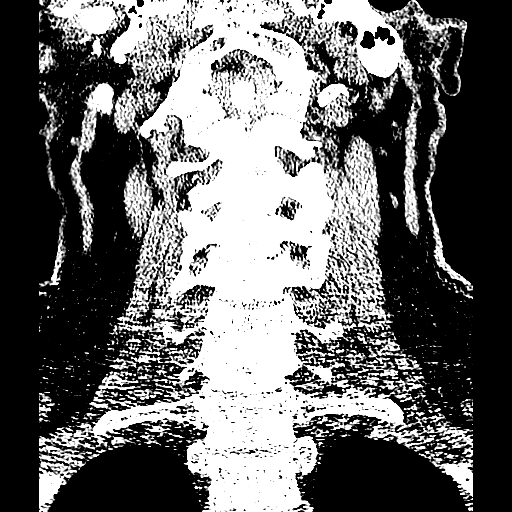
[im 18/40  brain]
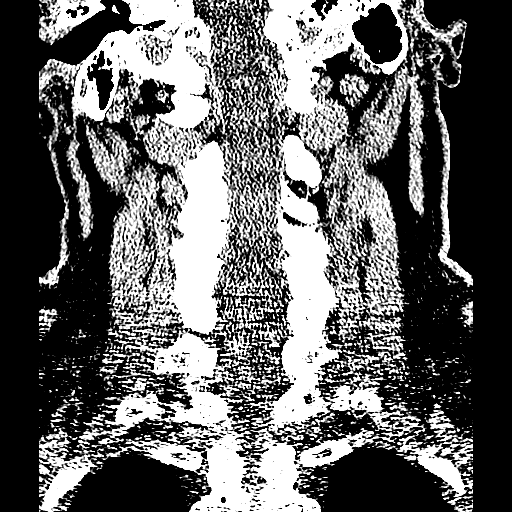
[im 22/40  brain]
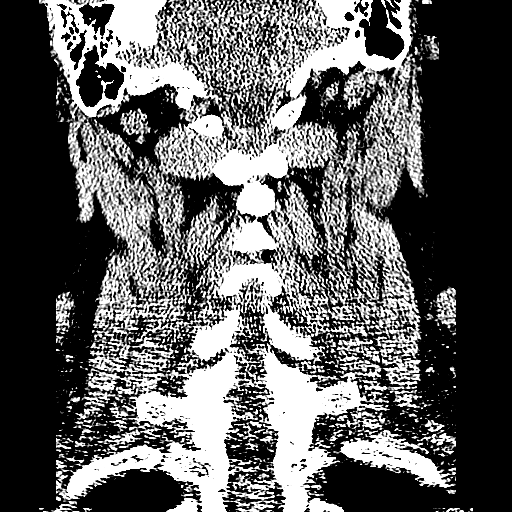

[sagittal · sagittal · 0.36mm/px · 3 of 70 slices shown]
[im 24/70  brain]
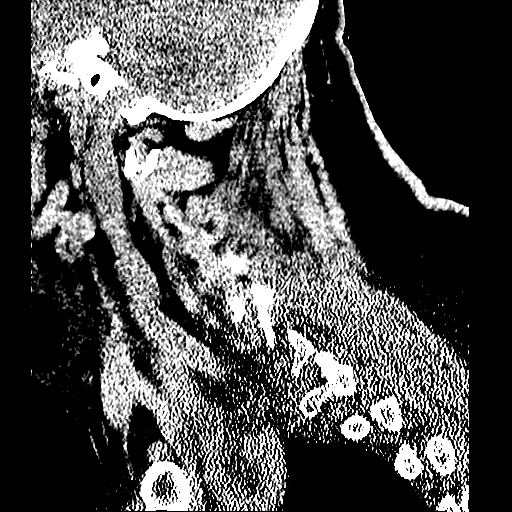
[im 35/70  brain]
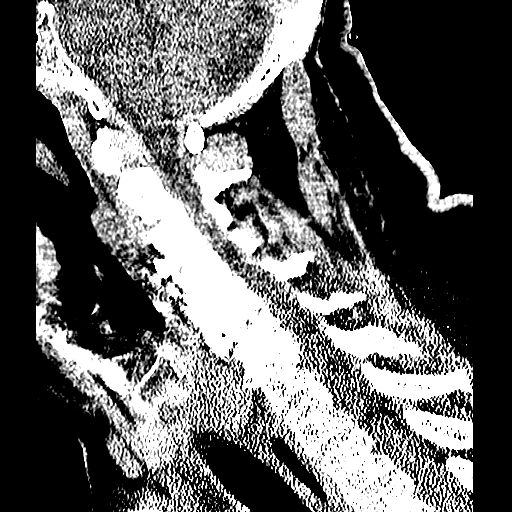
[im 47/70  brain]
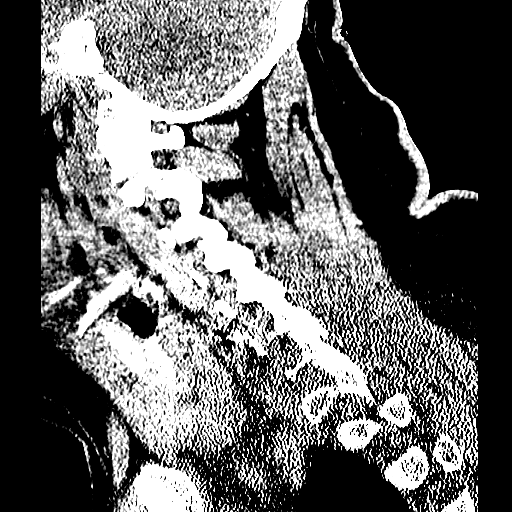

[16 of 47 positions shown; findings below may reference images not displayed]

FINDINGS: Chronic microvascular white matter disease involves the
central pons the in the periventricular white matter.

Old left posterior frontal lobe infarct noted.

The cerebellum, thalami, and basal ganglia appear unremarkable.
Motion artifact at the cranial vertex obscures the gray-white
differentiation.

No intracranial hemorrhage, mass lesion, or acute infarction is
identified.
IMPRESSION: 1. Periventricular and corona radiata white matter hypodensities
are most compatible with chronic ischemic microvascular white
matter disease.

2.  Old left posterior frontal lobe infarct noted.

CT CERVICAL SPINE
FINDINGS: Plate and screw fixation at C3 - C4-C5 noted anteriorly.
There is evidence of loosening along the C5 screws, and the right
C5 screw has backed [DATE] mm.

Posterior osseous ridging and loss of disc height noted at the C6-7
level.

No fracture or acute subluxation is observed.  No significant
prevertebral soft tissue swelling noted.

A 2.2 x 1.9 cm right thyroid nodule is observed.  There is a 1.8 x
1.3 cm left thyroid nodule.
IMPRESSION: 1.  Cervical fusion and spondylosis, without fracture or acute
subluxation.
2.  Loosening around the C5 fixation screws.  The right C5 screw
has backed [DATE] mm from the plate.
3.  Bilateral large thyroid nodules.  If not previously worked up
these could be further assessed by thyroid ultrasound on an
elective basis.

## 2013-03-01 ENCOUNTER — Emergency Department (HOSPITAL_COMMUNITY): Payer: Medicare Other

## 2013-03-01 ENCOUNTER — Telehealth: Payer: Self-pay | Admitting: Physician Assistant

## 2013-03-01 ENCOUNTER — Emergency Department (HOSPITAL_COMMUNITY)
Admission: EM | Admit: 2013-03-01 | Discharge: 2013-03-01 | Disposition: A | Discharge status: Home / Self Care | Payer: Medicare Other | Attending: Emergency Medicine | Admitting: Emergency Medicine

## 2013-03-01 ENCOUNTER — Encounter (HOSPITAL_COMMUNITY): Payer: Self-pay | Admitting: *Deleted

## 2013-03-01 DIAGNOSIS — E7800 Pure hypercholesterolemia, unspecified: Secondary | ICD-10-CM | POA: Insufficient documentation

## 2013-03-01 DIAGNOSIS — H548 Legal blindness, as defined in USA: Secondary | ICD-10-CM | POA: Insufficient documentation

## 2013-03-01 DIAGNOSIS — I739 Peripheral vascular disease, unspecified: Secondary | ICD-10-CM | POA: Insufficient documentation

## 2013-03-01 DIAGNOSIS — I5022 Chronic systolic (congestive) heart failure: Secondary | ICD-10-CM | POA: Insufficient documentation

## 2013-03-01 DIAGNOSIS — I2510 Atherosclerotic heart disease of native coronary artery without angina pectoris: Secondary | ICD-10-CM | POA: Insufficient documentation

## 2013-03-01 DIAGNOSIS — J449 Chronic obstructive pulmonary disease, unspecified: Secondary | ICD-10-CM | POA: Insufficient documentation

## 2013-03-01 DIAGNOSIS — Z794 Long term (current) use of insulin: Secondary | ICD-10-CM | POA: Insufficient documentation

## 2013-03-01 DIAGNOSIS — E669 Obesity, unspecified: Secondary | ICD-10-CM | POA: Insufficient documentation

## 2013-03-01 DIAGNOSIS — Z862 Personal history of diseases of the blood and blood-forming organs and certain disorders involving the immune mechanism: Secondary | ICD-10-CM | POA: Insufficient documentation

## 2013-03-01 DIAGNOSIS — Z8669 Personal history of other diseases of the nervous system and sense organs: Secondary | ICD-10-CM | POA: Insufficient documentation

## 2013-03-01 DIAGNOSIS — Z8739 Personal history of other diseases of the musculoskeletal system and connective tissue: Secondary | ICD-10-CM | POA: Insufficient documentation

## 2013-03-01 DIAGNOSIS — M1990 Unspecified osteoarthritis, unspecified site: Secondary | ICD-10-CM | POA: Insufficient documentation

## 2013-03-01 DIAGNOSIS — E11359 Type 2 diabetes mellitus with proliferative diabetic retinopathy without macular edema: Secondary | ICD-10-CM | POA: Insufficient documentation

## 2013-03-01 DIAGNOSIS — Z79899 Other long term (current) drug therapy: Secondary | ICD-10-CM | POA: Insufficient documentation

## 2013-03-01 DIAGNOSIS — Z951 Presence of aortocoronary bypass graft: Secondary | ICD-10-CM | POA: Insufficient documentation

## 2013-03-01 DIAGNOSIS — J4489 Other specified chronic obstructive pulmonary disease: Secondary | ICD-10-CM | POA: Insufficient documentation

## 2013-03-01 DIAGNOSIS — R0602 Shortness of breath: Secondary | ICD-10-CM | POA: Insufficient documentation

## 2013-03-01 DIAGNOSIS — R079 Chest pain, unspecified: Secondary | ICD-10-CM

## 2013-03-01 DIAGNOSIS — E1039 Type 1 diabetes mellitus with other diabetic ophthalmic complication: Secondary | ICD-10-CM | POA: Insufficient documentation

## 2013-03-01 DIAGNOSIS — I10 Essential (primary) hypertension: Secondary | ICD-10-CM | POA: Insufficient documentation

## 2013-03-01 DIAGNOSIS — Z8673 Personal history of transient ischemic attack (TIA), and cerebral infarction without residual deficits: Secondary | ICD-10-CM | POA: Insufficient documentation

## 2013-03-01 DIAGNOSIS — Z7982 Long term (current) use of aspirin: Secondary | ICD-10-CM | POA: Insufficient documentation

## 2013-03-01 DIAGNOSIS — I252 Old myocardial infarction: Secondary | ICD-10-CM | POA: Insufficient documentation

## 2013-03-01 DIAGNOSIS — R0789 Other chest pain: Secondary | ICD-10-CM | POA: Insufficient documentation

## 2013-03-01 DIAGNOSIS — Z8719 Personal history of other diseases of the digestive system: Secondary | ICD-10-CM | POA: Insufficient documentation

## 2013-03-01 DIAGNOSIS — Z7902 Long term (current) use of antithrombotics/antiplatelets: Secondary | ICD-10-CM | POA: Insufficient documentation

## 2013-03-01 LAB — CBC
MCH: 28.9 pg (ref 26.0–34.0)
MCV: 85.8 fL (ref 78.0–100.0)
Platelets: 202 10*3/uL (ref 150–400)
RBC: 4.78 MIL/uL (ref 4.22–5.81)
RDW: 15.2 % (ref 11.5–15.5)
WBC: 10.1 10*3/uL (ref 4.0–10.5)

## 2013-03-01 LAB — POCT I-STAT TROPONIN I: Troponin i, poc: 0 ng/mL (ref 0.00–0.08)

## 2013-03-01 LAB — BASIC METABOLIC PANEL
CO2: 32 mEq/L (ref 19–32)
Calcium: 9.4 mg/dL (ref 8.4–10.5)
Creatinine, Ser: 1.41 mg/dL — ABNORMAL HIGH (ref 0.50–1.35)
GFR calc Af Amer: 57 mL/min — ABNORMAL LOW (ref >90)
GFR calc non Af Amer: 49 mL/min — ABNORMAL LOW (ref >90)
Sodium: 134 mEq/L — ABNORMAL LOW (ref 135–145)

## 2013-03-01 LAB — PRO B NATRIURETIC PEPTIDE: Pro B Natriuretic peptide (BNP): 417.4 pg/mL — ABNORMAL HIGH (ref 0–125)

## 2013-03-01 NOTE — ED Provider Notes (Signed)
CSN: [id number]     Arrival date & time 03/01/13  1734 History     First MD Initiated Contact with Patient 03/01/13 1828     Chief Complaint  Patient presents with  . Shortness of Breath  . Chest Pain   (Consider location/radiation/quality/duration/timing/severity/associated sxs/prior Treatment) HPI..... mild chest pain described as tightness at approximately noon today without dyspnea, nausea, diaphoresis. He took one nitroglycerin which seemed to help. Patient has a long-standing history of cardiac disease and congestive heart failure. No peripheral edema. He feels he is back to baseline. Severity is mild  Past Medical History  Diagnosis Date  . CORONARY ARTERY DISEASE 03/11/2007    s/p CABG 1998;  admitted 08/2011 with an inferior STEMI.  LHC 09/07/11: Severe  Three-vessel CAD, LIMA-LAD patent, SVG-RCA chronically occluded, SVG-circumflex chronically occluded, SVG-diagonal 95%.  PCI: Promus DES to the SVG-diagonal.  Procedure c/b transient CHB req CPR and TTVP;  myoview 4/13:large severe inferolateral infarct, no ischemia, EF 25%    . DIABETES MELLITUS, TYPE I 03/11/2007  . HYPERTENSION 03/11/2007  . 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 HYPERCHOLESTEROLEMIA 09/28/2008  . CERVICAL RADICULOPATHY, LEFT 09/23/2008  . PERIPHERAL NEUROPATHY 09/23/2008  . Chronic systolic heart failure 09/23/2008    Echocardiogram 09/07/11: Difficult study, inferior and posterior lateral wall hypokinesis, mild LVH, EF 45%, mild LAE.   [name] RENAL INSUFFICIENCY 03/15/2010  . ED (erectile dysfunction)   . Hypogonadism male   . GI bleed 2012    Multiple Duodenal ulcer   . Obesity   . Legally blind     bilaterally  . Stroke 68    age 28 months; residual right sided weakness  . History of blood transfusion 2012  . Charcot foot due to diabetes mellitus     chronic pain  . Osteoarthritis     right hip and shoulder  . COPD (chronic obstructive pulmonary disease)   . Myocardial infarction 2013b Jan  .  PERIPHERAL VASCULAR INSUFFICIENCY,  LEGS, BILATERAL 08/17/2010    ABI's performed 04/19/11 R 0.72 L 0.71  . ANEMIA-IRON DEFICIENCY 09/23/2008    ulcer related last year  . Chronic edema     Chronic pedal edema   Past Surgical History  Procedure Laterality Date  . Carpal tunnel release      left  . Sigmoidoscopy  03/11/2001  . Venous doppler  01/30/2004  . Tonsillectomy and adenoidectomy      "as a kid"  . Cataract extraction w/ intraocular lens  implant, bilateral    . Coronary angioplasty with stent placement  08/2011    "1"  . Retinopathy surgery  2000's    "laser; both eyes"  . Wrist fusion  1976    right  . Anterior fusion cervical spine  2010    "C spine; Dr. Yetta Barre"  . Ankle fracture surgery  1976; ?date    LEFT:  fused; removed bulk of hardware  . Heel spur surgery  ~ 2007    ? left  . Irrigation and debridement knee  02/27/2012    Procedure: IRRIGATION AND DEBRIDEMENT KNEE;  Surgeon: Verlee Rossetti, MD;  Location: [facility] OR;  Service: Orthopedics;  Laterality: Right;  irrigation and drainage right knee septic bursitis  . Eye surgery  2008    cataract removal, cautery  . Coronary artery bypass graft  1998    CABG X5  . Fracture surgery      foot broken 1976   Family History  Problem Relation Age of Onset  . Heart  disease Mother     Coronary Artery Disease  . Heart disease Father     Coronary Artery Disease  . Heart disease Paternal Grandmother     Coronary Artery Disease  . Colon cancer Neg Hx   . Diabetes Other     Grandmother   History  Substance Use Topics  . Smoking status: Never Smoker   . Smokeless tobacco: Never Used  . Alcohol Use: No    Review of Systems  All other systems reviewed and are negative.    Allergies  Sildenafil; Percocet; and Contrast media  Home Medications   Current Outpatient Rx  Name  Route  Sig  Dispense  Refill  . albuterol (PROVENTIL HFA;VENTOLIN HFA) 108 (90 BASE) MCG/ACT inhaler   Inhalation   Inhale 2 puffs into the  lungs every 6 (six) hours as needed. For shortness of breath         . albuterol (PROVENTIL) (2.5 MG/3ML) 0.083% nebulizer solution   Nebulization   Take 3 mLs (2.5 mg total) by nebulization every 6 (six) hours as needed for wheezing.   360 mL   12   . aspirin 325 MG EC tablet   Oral   Take 81 mg by mouth daily. Patient's wife cuts into fourths, gives @ 81mg  daily.         [name] atorvastatin (LIPITOR) 40 MG tablet   Oral   Take 1 tablet (40 mg total) by mouth daily.   30 tablet   11   . beclomethasone (QVAR) 80 MCG/ACT inhaler   Inhalation   Inhale 2 puffs into the lungs 2 (two) times daily.   1 Inhaler   6   . carvedilol (COREG) 6.25 MG tablet   Oral   Take 6.25 mg by mouth 2 (two) times daily with a meal.         . clopidogrel (PLAVIX) 75 MG tablet   Oral   Take 1 tablet (75 mg total) by mouth daily with breakfast.   90 tablet   3   . clotrimazole (LOTRIMIN) 1 % cream   Topical   Apply 1 application topically daily as needed (for rash around mid section).         [name] co-enzyme Q-10 30 MG capsule   Oral   Take 30 mg by mouth daily.         Tery Sanfilippo Sodium (STOOL SOFTENER) 100 MG capsule   Oral   Take 100 mg by mouth daily as needed. constipation         . fentaNYL (DURAGESIC - DOSED MCG/HR) 25 MCG/HR   Transdermal   Place 1 patch (25 mcg total) onto the skin every 3 (three) days.   10 patch   0   . gabapentin (NEURONTIN) 600 MG tablet   Oral   Take 1 tablet (600 mg total) by mouth 3 (three) times daily.   90 tablet   4   . HYDROcodone-acetaminophen (NORCO) 10-325 MG per tablet   Oral   Take 1 tablet by mouth every 6 (six) hours as needed for pain.   100 tablet   3   . insulin NPH (HUMULIN N,NOVOLIN N) 100 UNIT/ML injection   Subcutaneous   Inject 70 Units into the skin daily before breakfast.         . ipratropium (ATROVENT) 0.02 % nebulizer solution   Nebulization   Take 2.5 mLs (500 mcg total) by nebulization 4 (four) times daily.    75 mL  12   . isosorbide mononitrate (IMDUR) 30 MG 24 hr tablet   Oral   Take 1 tablet (30 mg total) by mouth daily.   30 tablet   6   . losartan (COZAAR) 50 MG tablet   Oral   Take 50 mg by mouth daily.         . nitroGLYCERIN (NITROSTAT) 0.4 MG SL tablet   Sublingual   Place 1 tablet (0.4 mg total) under the tongue every 5 (five) minutes as needed for chest pain.   25 tablet   5   . potassium chloride SA (K-DUR,KLOR-CON) 20 MEQ tablet   Oral   Take 1 tablet (20 mEq total) by mouth 2 (two) times daily.   60 tablet   6   . silodosin (RAPAFLO) 8 MG CAPS capsule   Oral   Take 8 mg by mouth daily with breakfast.         . spironolactone (ALDACTONE) 25 MG tablet   Oral   Take 0.5 tablets (12.5 mg total) by mouth daily.   30 tablet   6     pls disregard 45 day supply. Thanks [place] CMA   . torsemide (DEMADEX) 20 MG tablet   Oral   Take 60 mg by mouth 2 (two) times daily.         . Insulin Syringe-Needle U-100 (INSULIN SYRINGE 1CC/31GX5/16") 31G X 5/16" 1 ML MISC      USE AS DIRECTED TO INJECT INSULIN FOUR TIMES DAILY   120 each   3    BP 114/94  Pulse 68  Temp(Src) 97.9 F (36.6 C) (Oral)  Resp 20  SpO2 100% Physical Exam  Nursing note and vitals reviewed. Constitutional: He is oriented to person, place, and time. He appears well-developed and well-nourished.  Obese, no acute distress, no dyspnea  HENT:  Head: Normocephalic and atraumatic.  Eyes: Conjunctivae and EOM are normal. Pupils are equal, round, and reactive to light.  Neck: Normal range of motion. Neck supple.  Cardiovascular: Normal rate, regular rhythm and normal heart sounds.   Pulmonary/Chest: Effort normal and breath sounds normal.  Abdominal: Soft. Bowel sounds are normal.  Musculoskeletal: Normal range of motion.  Neurological: He is alert and oriented to person, place, and time.  Skin: Skin is warm and dry.  1+ peripheral edema  Psychiatric: He has a normal mood and affect.     ED Course   Procedures (including critical care time)  Labs Reviewed  CBC  BASIC METABOLIC PANEL  PRO B NATRIURETIC PEPTIDE  POCT I-STAT TROPONIN I   Dg Chest 2 View  03/01/2013   *RADIOLOGY REPORT*  Clinical Data: Shortness of breath, chest pain  CHEST - 2 VIEW  Comparison: 02/21/2013  Findings: Lungs are clear. No pleural effusion or pneumothorax.  Cardiomegaly. Postsurgical changes related to prior CABG.  Degenerative changes of the visualized thoracolumbar spine. Cervical spine fixation hardware.  IMPRESSION: No evidence of acute cardiopulmonary disease.   Original Report Authenticated By: Charline Bills, M.D.   1. Chest pain     MDM  Patient reports he is back to baseline. Final laboratory data pending.  Patient has primary care and cardiology followup.  Discussed c Earley Favor NP  Donnetta Hutching, MD 03/01/13 [phone]

## 2013-03-01 NOTE — ED Notes (Signed)
Pt reports having mild chest discomfort, more epigastric area. Having sob, denies any swelling to extremities. Hx of chf. ekg done at triage. No resp distress noted, airway intact.

## 2013-03-01 NOTE — ED Provider Notes (Signed)
Labs reviewed   Normal for this patient instructed to use Nitro for anginal pain FU with PCP   Arman Filter, NP 03/01/13 2044

## 2013-03-01 NOTE — Telephone Encounter (Signed)
Patient called complaining of significant shortness of breath as well as mild, ongoing CP. His symptoms have persisted all day. He took 1 NTG tablet, with no palpable relief. He states that he was hospitalized here approximately 1 week ago with CHF. I advised him to contact EMS immediately, and to be taken directly to [facility] ED. Both he and his wife were in agreement with this recommendation.

## 2013-03-02 NOTE — ED Provider Notes (Signed)
Medical screening examination/treatment/procedure(s) were conducted as a shared visit with non-physician practitioner(s) and myself.  I personally evaluated the patient during the encounter.   See my note.  Donnetta Hutching MD  Donnetta Hutching, MD 03/02/13 2108

## 2013-03-05 ENCOUNTER — Telehealth: Payer: Self-pay | Admitting: Internal Medicine

## 2013-03-05 NOTE — Telephone Encounter (Signed)
I spoke with spouse. She stated pt is using his albuterol and atrovent nebs QID. Per spouse after he uses them at times he feels worse after treatment. He feels like he is "sufficating". Per spouse this does not happen every time he uses them. Spouse is requesting recs. Please advise MR thanks

## 2013-03-05 NOTE — Telephone Encounter (Signed)
Pt is aware. OV has been made with TP on 03/06/13 at 9:15am.

## 2013-03-05 NOTE — Telephone Encounter (Signed)
MDI was too costly but nebs are suffocating him; only hard choices. No clear solutions. Make appt with TP to go over solutions   Dr. Kalman Shan, M.D., [facility].C.P Pulmonary and Critical Care Medicine Staff Physician [facility] Pager: [phone], If no answer or between  15:00h - 7:00h: call [phone]  03/05/2013 2:00 PM

## 2013-03-06 ENCOUNTER — Encounter: Payer: Self-pay | Admitting: Adult Health

## 2013-03-06 ENCOUNTER — Ambulatory Visit (INDEPENDENT_AMBULATORY_CARE_PROVIDER_SITE_OTHER): Payer: Medicare Other | Admitting: Adult Health

## 2013-03-06 VITALS — BP 120/58 | HR 58 | Temp 97.5°F | Ht 64.0 in | Wt 235.0 lb

## 2013-03-06 DIAGNOSIS — J449 Chronic obstructive pulmonary disease, unspecified: Secondary | ICD-10-CM

## 2013-03-06 MED ORDER — BUDESONIDE 0.25 MG/2ML IN SUSP: 0.2500 mg | Freq: Every day | RESPIRATORY_TRACT | Status: DC

## 2013-03-06 MED ORDER — ALBUTEROL SULFATE (2.5 MG/3ML) 0.083% IN NEBU: 2.5000 mg | INHALATION_SOLUTION | Freq: Four times a day (QID) | RESPIRATORY_TRACT | Status: DC

## 2013-03-06 MED ORDER — BUDESONIDE 0.25 MG/2ML IN SUSP: 0.2500 mg | Freq: Two times a day (BID) | RESPIRATORY_TRACT | Status: DC

## 2013-03-06 MED ORDER — IPRATROPIUM BROMIDE 0.02 % IN SOLN: 500.0000 ug | Freq: Four times a day (QID) | RESPIRATORY_TRACT | Status: DC

## 2013-03-06 NOTE — Assessment & Plan Note (Signed)
?  Asthma flare off Advair  Will try to change qVAR to budesonide neb  Unable to add brovana as we are using albuterol Four times a day  [facility] will not cover)   Plan  Increase Albuterol and Ipratropium Neb Four times a day   Add Budesonide (Pulmicort ) Neb Twice daily   Stop QVAR .  follow up Dr. Marchelle Gearing in 6 weeks as planned  Please contact office for sooner follow up if symptoms do not improve or worsen or seek emergency care

## 2013-03-06 NOTE — Progress Notes (Signed)
Subjective:    Patient ID: Mark French, male    DOB: [birth year], 67 y.o.   MRN: [id number]  HPI IOV 01/03/2012 67 year old Body mass index is 36.48 kg/(m^2).  reports that he has never smoked. He has never used smokeless tobacco. 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  CAD and s/p CABG 1998 and now  MI in Sep 07, 2011 and s/p stent (cardiac arrest for '20 sec'). Went to [facility] nursing home and since then reports dyspnea on exertion. Progressive. Insidious onset. Also using roller assist to walk since MI in Jan 2013 but this is due to hip issues and diabetic retinopathy. Wife and he report associated significant deconditioning since Jan 2013. This is improving but not to the desired extent. Denies associated smoking or snoring. Dyspnea is associtaed with orthopnea and paroxysnal nocturnal dyspnea but this is chronic > 15 years and unchangd since jan 2013. HDyspnea is aggravated by "anything" which is at random and even at rest. Dyspnea relieved by advair disk started early maay 2013.    Exposure  reports that he has never smoked. He has never used smokeless tobacco. 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  12/05/11: Nuc med stress test Overall Impression: Abnormal stress nuclear study with a large, severe, fixed inferolateral defect consistent with previous infarct; no ischemia.  LV Ejection Fraction: 25% (note on Sep 07, 2011 ef 45% but no prior data available). LV Wall Motion: Inferolateral akinesis. Cards thought it was worse and meds adjusted. Per family no repepat cath due to renal function concern   Spirometry 12/20/11  - fev1 1.75L/53%, FVC 2.25L/73% and c/w restriction  Labs 12/31/11  creat 1.4mg %  hgb 11.9gm%  CXR 01/01/12 IMPRESSION:  No evidence of active pulmonary disease.  Original Report  Authenticated By: Marlon Pel, M  REC Difficult situation with shortness of breath  Your heart, weight, lack of fitness, diabetes, all contributing to shortness of breath I do not think there is lung disease per se and the abnormal breathing test probably reflects your weight  Unfortunately there is no magic bullet Continue cardiology advice Because you feel strongly that advair helps you, we will honor that and give you advair 1 puff twice daily and albuterol as needed   - my nurse will check if you need refills We will refer you to pulmonary rehab Followup 2 months or sooner if needed with full PFT   OV 03/15/2012  Followup dyspnea  Advair helping some. But overall no change in dyspnea. PFT overall unchanged despite advair. He has no new complaints  PFTs - Fev1 1.78L/68%, Ratio 76 (70). Small airways 55%, TLC 4L/73%, DLCO 16/68%  REC Difficult situation with shortness of breath  Your heart, weight, lack of fitness, diabetes, all contributing to shortness of breath In addition you likely have asthma/copd based on improvement with advair and breathing test results Unfortunately there is no magic bullet to help you because you cannot do rehab at hospital due to vision issues and joint issues I recommend you  - Continue cardiology advice  - continue advair  - Please start spiriva 1 puff daily - take sample, script and show technique  - I am tryoing to arrange a pulmonary program for you at home Return in 3 months or sooner if needed  OV 02/12/2013 FU dyspnea with asthma Says he is spending > $ 200 per month for advair/spiriva. Both are helping though but in bankrupting him. However,  He finds drugs to be useful and very helpful for dyspnea. Does not want tog ive up. I learned today he had smoke exposure a lot working as Company secretary. No new issues.  >stop spiriva/advair , begin albuterol/atrovent neb   03/06/2013 Acute OV  Complains of increased SOB since beginning atrovent and  albuterol nebs at last ov. Feels that since starting nebs and being off spiriva and advair , his breathing is not as good.  Was recently admitted 2 weeks ago , with decompensated CHF that responded to diuresis.  No chest pain, orthopnea, discolored mucus, fever or increased edema. Was not able to afford the advair and spiriva . Only using duoneb 2-3 times a day . Does not feel QVAR is working.  He is a never smoker.     Review of Systems  Constitutional: Negative for fever and unexpected weight change.  HENT: Negative for ear pain, nosebleeds, congestion, sore throat, rhinorrhea, sneezing, trouble swallowing, dental problem, postnasal drip and sinus pressure.   Eyes: Negative for redness and itching.  Respiratory: Positive for shortness of breath. Negative for cough, chest tightness and wheezing.   Cardiovascular: Negative for palpitations and leg swelling.  Gastrointestinal: Negative for nausea and vomiting.  Genitourinary: Negative for dysuria.  Musculoskeletal: Negative for joint swelling.  Skin: Negative for rash.  Neurological: Negative for headaches.  Hematological: Does not bruise/bleed easily.  Psychiatric/Behavioral: Negative for dysphoric mood. The patient is not nervous/anxious.        Objective:   Physical Exam GEN: A/Ox3; pleasant , NAD, elderly   HEENT:  [place]/AT,  EACs-clear, TMs-wnl, NOSE-clear, THROAT-clear, no lesions, no postnasal drip or exudate noted.   NECK:  Supple w/ fair ROM; no JVD; normal carotid impulses w/o bruits; no thyromegaly or nodules palpated; no lymphadenopathy.  RESP  Diminshed BS in bases., no accessory muscle use, no dullness to percussion  CARD:  RRR, no m/r/g  , tr-1  peripheral edema, pulses intact, no cyanosis or clubbing, venous insufficiency changes   GI:   Soft & nt; nml bowel sounds; no organomegaly or masses detected.  Musco: Warm bil, no deformities or joint swelling noted.   Neuro: alert, no focal deficits noted.    Skin: Warm,  no lesions or rashes    CXR 03/01/13 No evidence of acute cardiopulmonary disease.       Assessment & Plan:

## 2013-03-06 NOTE — Patient Instructions (Addendum)
Increase Albuterol and Ipratropium Neb Four times a day   Add Budesonide (Pulmicort ) Neb Twice daily   Stop QVAR .  follow up Dr. Marchelle Gearing in 6 weeks as planned  Please contact office for sooner follow up if symptoms do not improve or worsen or seek emergency care

## 2013-03-07 IMAGING — DX DG CHEST 1V PORT
1 series · 1 of 1 positions shown · non-contrast
Comparison: 08/31/2011

CLINICAL DATA: Chest pain

PORTABLE CHEST - 1 VIEW

[none]
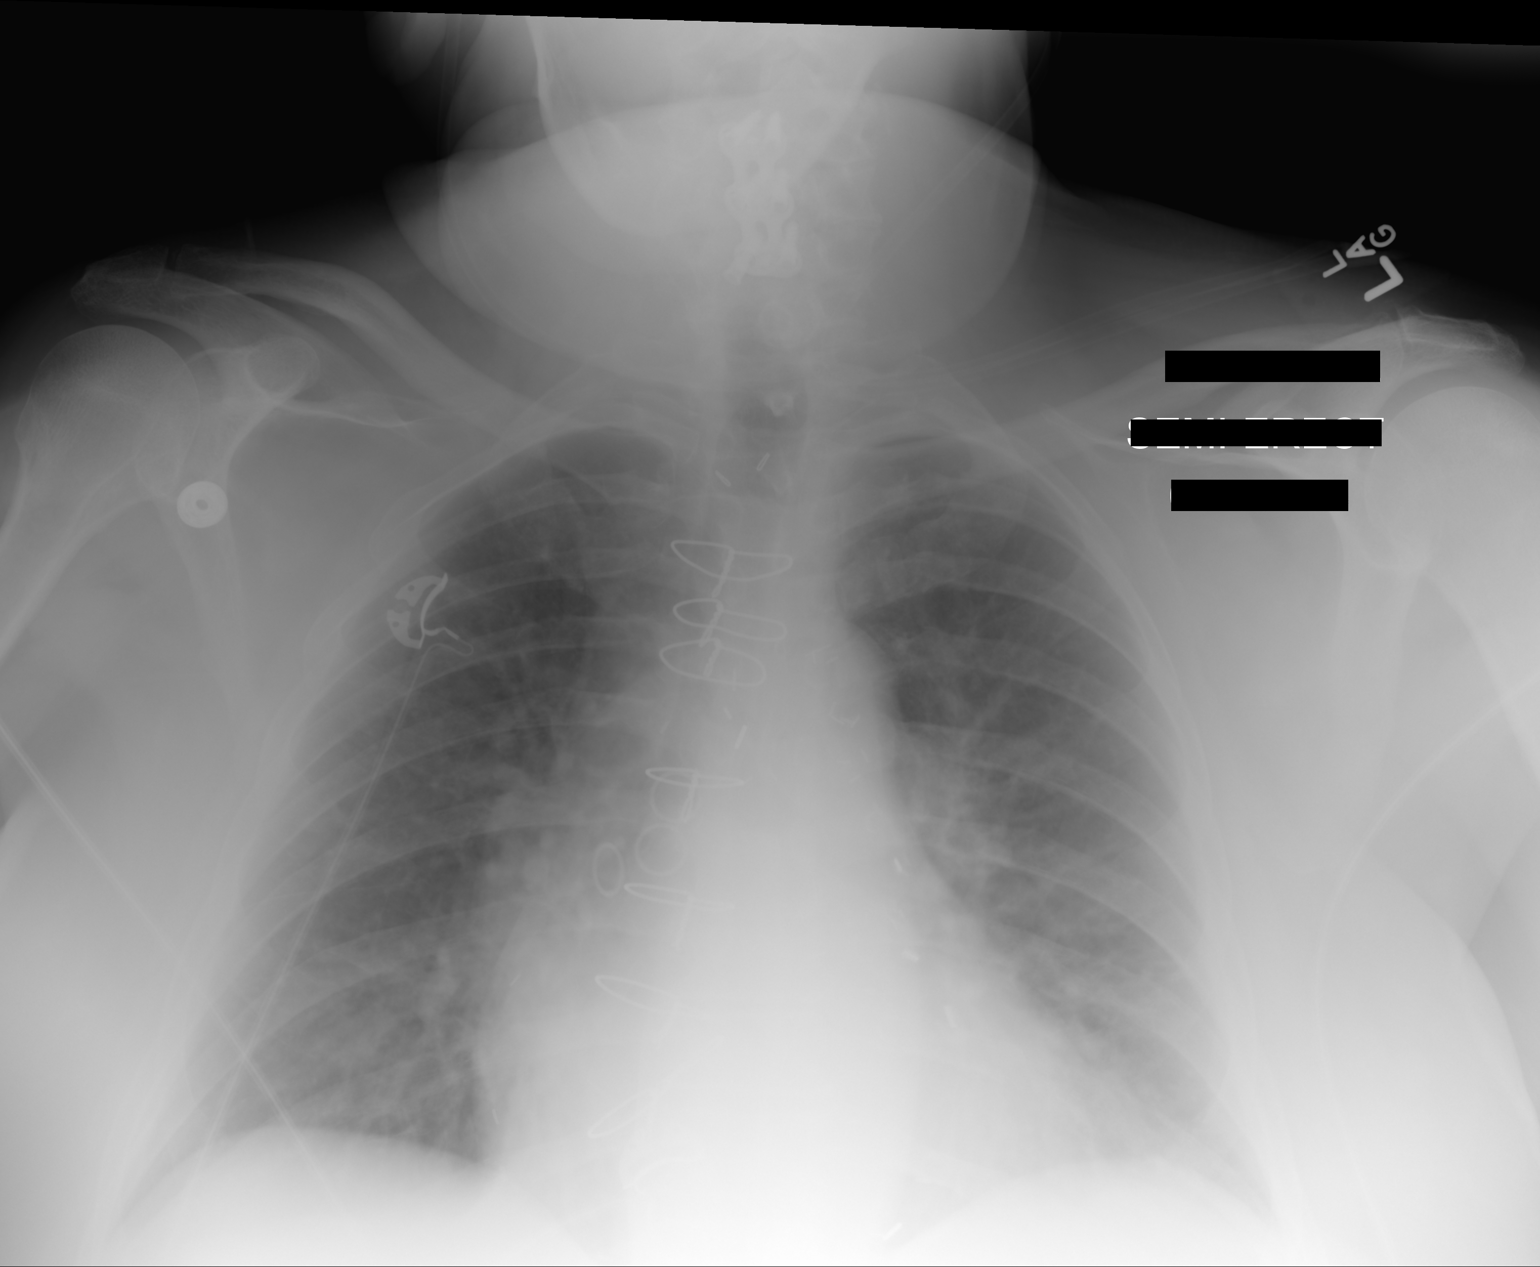

[1 of 1 positions shown; findings below may reference images not displayed]

FINDINGS: Stable postoperative changes in the mediastinum.  Cardiac
enlargement with mild prominence of pulmonary vascularity.  Hazy
appearance of the heart and pulmonary vascularity may represent
motion artifact versus early edema.  No blunting of costophrenic
angles.  No pneumothorax.  No focal airspace consolidation.
Postoperative change in the base of the neck.
IMPRESSION: Cardiac enlargement with pulmonary vascular congestion and
suggestion of early edema versus motion artifact in the lungs.

## 2013-03-13 ENCOUNTER — Telehealth: Payer: Self-pay | Admitting: Cardiovascular Disease

## 2013-03-13 NOTE — Telephone Encounter (Signed)
New prob  Pt wife said pt is not feeling well. He is having stomach aches and indigestion.  She wants to know what can he do.

## 2013-03-13 NOTE — Telephone Encounter (Signed)
Spoke with patient's wife, Mark French who states patient has not been feeling well over the past 2 days.  She states patient feels like he needs to belch and cannot; patient took a NTG and felt better; denies SOB. Patient states his abdomen has been distended but that it is better now and wife states the patient does not have lower extremity swelling.  Patient states he also used his nebulizer which made him feel worse.  States last BM at 1600 today.  I advised that since the NTG relieved the pain that patient needs to go to the emergency department.  Patient states that he is just nauseated now and that if pain occurs again he will take another NTG and if it relieves it he will go to the hospital.  I advised again that that is our recommendation and patient and wife verbalized understanding.

## 2013-03-21 ENCOUNTER — Encounter: Payer: Self-pay | Admitting: Cardiovascular Disease

## 2013-03-21 ENCOUNTER — Emergency Department (HOSPITAL_COMMUNITY): Payer: Medicare Other

## 2013-03-21 ENCOUNTER — Encounter (HOSPITAL_COMMUNITY): Payer: Self-pay | Admitting: Emergency Medicine

## 2013-03-21 ENCOUNTER — Emergency Department (HOSPITAL_COMMUNITY)
Admission: EM | Admit: 2013-03-21 | Discharge: 2013-03-21 | Disposition: A | Discharge status: Home / Self Care | Payer: Medicare Other | Attending: Emergency Medicine | Admitting: Emergency Medicine

## 2013-03-21 ENCOUNTER — Ambulatory Visit (INDEPENDENT_AMBULATORY_CARE_PROVIDER_SITE_OTHER): Payer: Medicare Other | Admitting: Cardiovascular Disease

## 2013-03-21 VITALS — BP 100/56 | HR 61 | Ht 64.0 in | Wt 238.0 lb

## 2013-03-21 DIAGNOSIS — Z794 Long term (current) use of insulin: Secondary | ICD-10-CM | POA: Insufficient documentation

## 2013-03-21 DIAGNOSIS — Y9389 Activity, other specified: Secondary | ICD-10-CM | POA: Insufficient documentation

## 2013-03-21 DIAGNOSIS — Z87448 Personal history of other diseases of urinary system: Secondary | ICD-10-CM | POA: Insufficient documentation

## 2013-03-21 DIAGNOSIS — I5022 Chronic systolic (congestive) heart failure: Secondary | ICD-10-CM | POA: Insufficient documentation

## 2013-03-21 DIAGNOSIS — S79929A Unspecified injury of unspecified thigh, initial encounter: Secondary | ICD-10-CM | POA: Insufficient documentation

## 2013-03-21 DIAGNOSIS — Z8679 Personal history of other diseases of the circulatory system: Secondary | ICD-10-CM | POA: Insufficient documentation

## 2013-03-21 DIAGNOSIS — Z862 Personal history of diseases of the blood and blood-forming organs and certain disorders involving the immune mechanism: Secondary | ICD-10-CM | POA: Insufficient documentation

## 2013-03-21 DIAGNOSIS — I10 Essential (primary) hypertension: Secondary | ICD-10-CM | POA: Insufficient documentation

## 2013-03-21 DIAGNOSIS — S46909A Unspecified injury of unspecified muscle, fascia and tendon at shoulder and upper arm level, unspecified arm, initial encounter: Secondary | ICD-10-CM | POA: Insufficient documentation

## 2013-03-21 DIAGNOSIS — R269 Unspecified abnormalities of gait and mobility: Secondary | ICD-10-CM | POA: Insufficient documentation

## 2013-03-21 DIAGNOSIS — IMO0002 Reserved for concepts with insufficient information to code with codable children: Secondary | ICD-10-CM | POA: Insufficient documentation

## 2013-03-21 DIAGNOSIS — E7800 Pure hypercholesterolemia, unspecified: Secondary | ICD-10-CM | POA: Insufficient documentation

## 2013-03-21 DIAGNOSIS — S4980XA Other specified injuries of shoulder and upper arm, unspecified arm, initial encounter: Secondary | ICD-10-CM | POA: Insufficient documentation

## 2013-03-21 DIAGNOSIS — E1049 Type 1 diabetes mellitus with other diabetic neurological complication: Secondary | ICD-10-CM | POA: Insufficient documentation

## 2013-03-21 DIAGNOSIS — Z8669 Personal history of other diseases of the nervous system and sense organs: Secondary | ICD-10-CM | POA: Insufficient documentation

## 2013-03-21 DIAGNOSIS — W1809XA Striking against other object with subsequent fall, initial encounter: Secondary | ICD-10-CM | POA: Insufficient documentation

## 2013-03-21 DIAGNOSIS — S79919A Unspecified injury of unspecified hip, initial encounter: Secondary | ICD-10-CM | POA: Insufficient documentation

## 2013-03-21 DIAGNOSIS — S3981XA Other specified injuries of abdomen, initial encounter: Secondary | ICD-10-CM | POA: Insufficient documentation

## 2013-03-21 DIAGNOSIS — M1990 Unspecified osteoarthritis, unspecified site: Secondary | ICD-10-CM | POA: Insufficient documentation

## 2013-03-21 DIAGNOSIS — I252 Old myocardial infarction: Secondary | ICD-10-CM | POA: Insufficient documentation

## 2013-03-21 DIAGNOSIS — Z8719 Personal history of other diseases of the digestive system: Secondary | ICD-10-CM | POA: Insufficient documentation

## 2013-03-21 DIAGNOSIS — J4489 Other specified chronic obstructive pulmonary disease: Secondary | ICD-10-CM | POA: Insufficient documentation

## 2013-03-21 DIAGNOSIS — Z8739 Personal history of other diseases of the musculoskeletal system and connective tissue: Secondary | ICD-10-CM | POA: Insufficient documentation

## 2013-03-21 DIAGNOSIS — S59909A Unspecified injury of unspecified elbow, initial encounter: Secondary | ICD-10-CM | POA: Insufficient documentation

## 2013-03-21 DIAGNOSIS — Z8639 Personal history of other endocrine, nutritional and metabolic disease: Secondary | ICD-10-CM | POA: Insufficient documentation

## 2013-03-21 DIAGNOSIS — Z8673 Personal history of transient ischemic attack (TIA), and cerebral infarction without residual deficits: Secondary | ICD-10-CM | POA: Insufficient documentation

## 2013-03-21 DIAGNOSIS — Z7902 Long term (current) use of antithrombotics/antiplatelets: Secondary | ICD-10-CM | POA: Insufficient documentation

## 2013-03-21 DIAGNOSIS — W050XXA Fall from non-moving wheelchair, initial encounter: Secondary | ICD-10-CM | POA: Insufficient documentation

## 2013-03-21 DIAGNOSIS — Z9861 Coronary angioplasty status: Secondary | ICD-10-CM | POA: Insufficient documentation

## 2013-03-21 DIAGNOSIS — Z7982 Long term (current) use of aspirin: Secondary | ICD-10-CM | POA: Insufficient documentation

## 2013-03-21 DIAGNOSIS — J449 Chronic obstructive pulmonary disease, unspecified: Secondary | ICD-10-CM | POA: Insufficient documentation

## 2013-03-21 DIAGNOSIS — E11359 Type 2 diabetes mellitus with proliferative diabetic retinopathy without macular edema: Secondary | ICD-10-CM | POA: Insufficient documentation

## 2013-03-21 DIAGNOSIS — Z951 Presence of aortocoronary bypass graft: Secondary | ICD-10-CM | POA: Insufficient documentation

## 2013-03-21 DIAGNOSIS — T07XXXA Unspecified multiple injuries, initial encounter: Secondary | ICD-10-CM

## 2013-03-21 DIAGNOSIS — Z9889 Other specified postprocedural states: Secondary | ICD-10-CM | POA: Insufficient documentation

## 2013-03-21 DIAGNOSIS — S8000XA Contusion of unspecified knee, initial encounter: Secondary | ICD-10-CM | POA: Insufficient documentation

## 2013-03-21 DIAGNOSIS — I2510 Atherosclerotic heart disease of native coronary artery without angina pectoris: Secondary | ICD-10-CM | POA: Insufficient documentation

## 2013-03-21 DIAGNOSIS — E669 Obesity, unspecified: Secondary | ICD-10-CM | POA: Insufficient documentation

## 2013-03-21 DIAGNOSIS — Y929 Unspecified place or not applicable: Secondary | ICD-10-CM | POA: Insufficient documentation

## 2013-03-21 DIAGNOSIS — S6990XA Unspecified injury of unspecified wrist, hand and finger(s), initial encounter: Secondary | ICD-10-CM | POA: Insufficient documentation

## 2013-03-21 DIAGNOSIS — Z79899 Other long term (current) drug therapy: Secondary | ICD-10-CM | POA: Insufficient documentation

## 2013-03-21 LAB — BASIC METABOLIC PANEL
BUN: 25 mg/dL — ABNORMAL HIGH (ref 6–23)
Calcium: 8.7 mg/dL (ref 8.4–10.5)
Creatinine, Ser: 1.5 mg/dL (ref 0.4–1.5)
GFR: 48.87 mL/min — ABNORMAL LOW (ref >60.00)
Glucose, Bld: 105 mg/dL — ABNORMAL HIGH (ref 70–99)

## 2013-03-21 MED ORDER — NAPROXEN 500 MG PO TABS: 500.0000 mg | ORAL_TABLET | Freq: Two times a day (BID) | ORAL | Status: DC

## 2013-03-21 MED ORDER — TETANUS-DIPHTH-ACELL PERTUSSIS 5-2.5-18.5 LF-MCG/0.5 IM SUSP: 0.5000 mL | Freq: Once | INTRAMUSCULAR | Status: DC

## 2013-03-21 MED ORDER — NAPROXEN 500 MG PO TABS
500.0000 mg | ORAL_TABLET | Freq: Once | ORAL | Status: AC
  Administered 2013-03-21: 500 mg via ORAL
  Filled 2013-03-21: qty 1

## 2013-03-21 NOTE — ED Notes (Signed)
Patient transported to X-ray 

## 2013-03-21 NOTE — ED Notes (Signed)
Pt reports that he was driving his wheelchair onto the ramp into his [place] when he drove off and fell onto the concrete. Pt reports L leg, L shoulder and abd pain. Pt denies hitting head or LOC. Pt is A&O and in NAD

## 2013-03-21 NOTE — ED Provider Notes (Signed)
CSN: [id number]     Arrival date & time 03/21/13  1208 History     First MD Initiated Contact with Patient 03/21/13 1221     Chief Complaint  Patient presents with  . Leg Pain  . Shoulder Pain  . Abdominal Pain   (Consider location/radiation/quality/duration/timing/severity/associated sxs/prior Treatment) HPI Comments: 67 year old male, history of requiring constant wheelchair for mobility presents after falling off of the ramp as he was trying to get into his motor vehicle when his back wheel of the wheelchair slipped off the ramp. He reports falling onto the concrete onto his left side injuring his left elbow, hip and knee. He was unable to get up by himself, called EMS who helped him get back onto his wheelchair for transport. He complains of ongoing mild to moderate pain in the knee elbow and hip on the left, nothing makes this better, worse with palpation and movement, no significant bleeding, no head injury, no neck pain, no weakness or numbness. He also complained of mild abdominal pain but this was only with the paramedics tried to get him back into his wheelchair. He does not have abdominal pain at this time and denies injuring his abdomen.  Patient is a 67 y.o. male presenting with leg pain, shoulder pain, and abdominal pain. The history is provided by the patient.  Leg Pain Associated symptoms: no back pain and no neck pain   Shoulder Pain Associated symptoms include abdominal pain. Pertinent negatives include no headaches.  Abdominal Pain   Past Medical History  Diagnosis Date  . CORONARY ARTERY DISEASE 03/11/2007    s/p CABG 1998;  admitted 08/2011 with an inferior STEMI.  LHC 09/07/11: Severe  Three-vessel CAD, LIMA-LAD patent, SVG-RCA chronically occluded, SVG-circumflex chronically occluded, SVG-diagonal 95%.  PCI: Promus DES to the SVG-diagonal.  Procedure c/b transient CHB req CPR and TTVP;  myoview 4/13:large severe inferolateral infarct, no ischemia, EF 25%    . DIABETES  MELLITUS, TYPE I 03/11/2007  . HYPERTENSION 03/11/2007  . 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 HYPERCHOLESTEROLEMIA 09/28/2008  . CERVICAL RADICULOPATHY, LEFT 09/23/2008  . PERIPHERAL NEUROPATHY 09/23/2008  . Chronic systolic heart failure 09/23/2008    Echocardiogram 09/07/11: Difficult study, inferior and posterior lateral wall hypokinesis, mild LVH, EF 45%, mild LAE.   [name] RENAL INSUFFICIENCY 03/15/2010  . ED (erectile dysfunction)   . Hypogonadism male   . GI bleed 2012    Multiple Duodenal ulcer   . Obesity   . Legally blind     bilaterally  . Stroke 58    age 47 months; residual right sided weakness  . History of blood transfusion 2012  . Charcot foot due to diabetes mellitus     chronic pain  . Osteoarthritis     right hip and shoulder  . COPD (chronic obstructive pulmonary disease)   . Myocardial infarction 2013b Jan  . PERIPHERAL VASCULAR INSUFFICIENCY,  LEGS, BILATERAL 08/17/2010    ABI's performed 04/19/11 R 0.72 L 0.71  . ANEMIA-IRON DEFICIENCY 09/23/2008    ulcer related last year  . Chronic edema     Chronic pedal edema   Past Surgical History  Procedure Laterality Date  . Carpal tunnel release      left  . Sigmoidoscopy  03/11/2001  . Venous doppler  01/30/2004  . Tonsillectomy and adenoidectomy      "as a kid"  . Cataract extraction w/ intraocular lens  implant, bilateral    . Coronary angioplasty with stent placement  08/2011    "  1"  . Retinopathy surgery  2000's    "laser; both eyes"  . Wrist fusion  1976    right  . Anterior fusion cervical spine  2010    "C spine; Dr. Yetta Barre"  . Ankle fracture surgery  1976; ?date    LEFT:  fused; removed bulk of hardware  . Heel spur surgery  ~ 2007    ? left  . Irrigation and debridement knee  02/27/2012    Procedure: IRRIGATION AND DEBRIDEMENT KNEE;  Surgeon: Verlee Rossetti, MD;  Location: [facility] OR;  Service: Orthopedics;  Laterality: Right;  irrigation and drainage right knee septic bursitis  . Eye surgery   2008    cataract removal, cautery  . Coronary artery bypass graft  1998    CABG X5  . Fracture surgery      foot broken 1976   Family History  Problem Relation Age of Onset  . Heart disease Mother     Coronary Artery Disease  . Heart disease Father     Coronary Artery Disease  . Heart disease Paternal Grandmother     Coronary Artery Disease  . Colon cancer Neg Hx   . Diabetes Other     Grandmother   History  Substance Use Topics  . Smoking status: Never Smoker   . Smokeless tobacco: Never Used  . Alcohol Use: No    Review of Systems  HENT: Negative for neck pain.   Gastrointestinal: Positive for abdominal pain.  Musculoskeletal: Positive for gait problem. Negative for back pain and joint swelling.  Skin: Positive for wound.  Neurological: Negative for numbness and headaches.  Hematological: Bruises/bleeds easily ( plavix / ASA).    Allergies  Sildenafil; Percocet; and Contrast media  Home Medications   Current Outpatient Rx  Name  Route  Sig  Dispense  Refill  . albuterol (PROVENTIL HFA;VENTOLIN HFA) 108 (90 BASE) MCG/ACT inhaler   Inhalation   Inhale 2 puffs into the lungs every 6 (six) hours as needed. For shortness of breath         . albuterol (PROVENTIL) (2.5 MG/3ML) 0.083% nebulizer solution   Nebulization   Take 2.5 mg by nebulization 2 (two) times daily.         [name] aspirin 81 MG tablet   Oral   Take 81 mg by mouth at bedtime.          [name] atorvastatin (LIPITOR) 40 MG tablet   Oral   Take 40 mg by mouth every evening.         . budesonide (PULMICORT) 0.25 MG/2ML nebulizer solution   Nebulization   Take 2 mLs (0.25 mg total) by nebulization 2 (two) times daily. Dx 496   120 mL   12   . carvedilol (COREG) 6.25 MG tablet   Oral   Take 6.25 mg by mouth 2 (two) times daily with a meal.         . clopidogrel (PLAVIX) 75 MG tablet   Oral   Take 1 tablet (75 mg total) by mouth daily with breakfast.   90 tablet   3   . clotrimazole  (LOTRIMIN) 1 % cream   Topical   Apply 1 application topically daily as needed (for rash around mid section).         [name] co-enzyme Q-10 30 MG capsule   Oral   Take 30 mg by mouth daily.         Tery Sanfilippo Sodium (STOOL SOFTENER) 100 MG capsule  Oral   Take 100 mg by mouth daily as needed. constipation         . fentaNYL (DURAGESIC - DOSED MCG/HR) 25 MCG/HR   Transdermal   Place 1 patch (25 mcg total) onto the skin every 3 (three) days.   10 patch   0   . gabapentin (NEURONTIN) 600 MG tablet   Oral   Take 1 tablet (600 mg total) by mouth 3 (three) times daily.   90 tablet   4   . HYDROcodone-acetaminophen (NORCO) 10-325 MG per tablet   Oral   Take 1 tablet by mouth every 6 (six) hours as needed for pain.   100 tablet   3   . insulin NPH (HUMULIN N,NOVOLIN N) 100 UNIT/ML injection   Subcutaneous   Inject 70 Units into the skin daily before breakfast.         . ipratropium (ATROVENT) 0.02 % nebulizer solution   Nebulization   Take 500 mcg by nebulization 2 (two) times daily.         . isosorbide mononitrate (IMDUR) 30 MG 24 hr tablet   Oral   Take 1 tablet (30 mg total) by mouth daily.   30 tablet   6   . losartan (COZAAR) 50 MG tablet   Oral   Take 50 mg by mouth every morning.          . nitroGLYCERIN (NITROSTAT) 0.4 MG SL tablet   Sublingual   Place 1 tablet (0.4 mg total) under the tongue every 5 (five) minutes as needed for chest pain.   25 tablet   5   . pantoprazole (PROTONIX) 40 MG tablet   Oral   Take 40 mg by mouth daily.          . potassium chloride SA (K-DUR,KLOR-CON) 20 MEQ tablet   Oral   Take 1 tablet (20 mEq total) by mouth 2 (two) times daily.   60 tablet   6   . silodosin (RAPAFLO) 8 MG CAPS capsule   Oral   Take 8 mg by mouth every evening.          [name] spironolactone (ALDACTONE) 25 MG tablet   Oral   Take 0.5 tablets (12.5 mg total) by mouth daily.   30 tablet   6     pls disregard 45 day supply. Thanks [place]  CMA   . torsemide (DEMADEX) 20 MG tablet   Oral   Take 60 mg by mouth 2 (two) times daily.         . naproxen (NAPROSYN) 500 MG tablet   Oral   Take 1 tablet (500 mg total) by mouth 2 (two) times daily with a meal.   30 tablet   0    BP 104/51  Pulse 68  Temp(Src) 98.2 F (36.8 C) (Oral)  Resp 20  SpO2 98% Physical Exam  Nursing note and vitals reviewed. Constitutional: He appears well-developed and well-nourished. No distress.  HENT:  Head: Normocephalic and atraumatic.  Eyes: Conjunctivae are normal. No scleral icterus.  Cardiovascular: Normal rate, regular rhythm and intact distal pulses.   Pulmonary/Chest: Effort normal and breath sounds normal.  Abdominal:  No reproducible tenderness to the abdomen, he is obese but nontender  Musculoskeletal: He exhibits tenderness. He exhibits no edema.  Tender to palpation over the left elbow associated with abrasion on the extensor surface of the proximal elbow, tender over the left knee and proximal tibia where there is an associated contusion and small hematoma.  Left lower extremity with mild erythema of the skin, this is consistent with the patient's prior history of peripheral vascular disease, he states this has not changed in appearance.  Neurological: He is alert.  Skin: Skin is warm and dry. No rash noted. He is not diaphoretic. There is erythema (as above left lower extremity with erythema).    ED Course   Procedures (including critical care time)  Labs Reviewed - No data to display Dg Elbow Complete Left  03/21/2013   *RADIOLOGY REPORT*  Clinical Data: Larey Seat.  Left elbow pain.  LEFT ELBOW - COMPLETE 3+ VIEW  Comparison: None  Findings: The joint spaces are maintained.  Mild degenerative changes.  No acute fracture or joint effusion.  IMPRESSION: No acute bony findings.   Original Report Authenticated By: Rudie Meyer, M.D.   Dg Hip Complete Left  03/21/2013   *RADIOLOGY REPORT*  Clinical Data: Trauma post fall left knee  pain, left hip pain  LEFT HIP - COMPLETE 2+ VIEW  Comparison: None.  Findings: Three views of the left hip submitted.  No acute fracture or subluxation.  There is diffuse osteopenia.  Mild spurring of superior acetabulum.  Minimal spurring of the greater femoral trochanter.  IMPRESSION: Suboptimal study due to diffuse osteopenia.  No definite fracture or subluxation.  Mild spurring of superior acetabulum.   Original Report Authenticated By: Natasha Mead, M.D.   Dg Knee Complete 4 Views Left  03/21/2013   *RADIOLOGY REPORT*  Clinical Data: Larey Seat.  Left knee pain.  LEFT KNEE - COMPLETE 4+ VIEW  Comparison: None  Findings: The joint spaces are maintained.  No acute fracture or osteochondral abnormality.  No definite joint effusion.  Vascular calcifications are noted.  IMPRESSION: No acute fracture.   Original Report Authenticated By: Rudie Meyer, M.D.   1. Contusion of multiple sites     MDM  The patient does have tenderness over the left hip, left knee, left elbow. He is able to operate his electric scooter without difficulty, will need imaging to rule out underlying fractures, otherwise hemodynamically stable, nontraumatic injury to the brain or spine. No focal neurologic deficits. Update tetanus as needed, pain medication as needed.  Imaging neg for acute fracture - pt appears stable for d/c.  Meds given in ED:  Medications  naproxen (NAPROSYN) tablet 500 mg (500 mg Oral Given 03/21/13 1317)    New Prescriptions   NAPROXEN (NAPROSYN) 500 MG TABLET    Take 1 tablet (500 mg total) by mouth 2 (two) times daily with a meal.      Vida Roller, MD 03/21/13 1354

## 2013-03-21 NOTE — Patient Instructions (Addendum)
Your physician recommends that you have lab work today: BMP  Your physician recommends that you schedule a follow-up appointment in: 1 MONTH with Dr Excell Seltzer  Your physician recommends that you continue on your current medications as directed. Please refer to the Current Medication list given to you today.

## 2013-03-21 NOTE — Progress Notes (Signed)
HPI:  67 year old gentleman presenting for followup evaluation. The patient has congestive heart failure, morbid obesity, and extensive coronary artery disease. He also has COPD and chronic kidney disease. He's had remote CABG and then last year presented with an anterolateral infarction related to severe disease in the diagonal vein graft. He has severe left ventricular systolic dysfunction. He's had recurrent hospitalizations for heart failure. Last hospitalization was July 18 when he was diuresed and discharged after 24 hours.  The patient reports stable symptoms of dyspnea with activity. His activity level is extremely limited because of his chronic illness. He is essentially wheelchair-bound. He denies chest pain or pressure, orthopnea, or PND. He does the best he can with diet, but has not followed a strict low salt diet.  Outpatient Encounter Prescriptions as of 03/21/2013  Medication Sig Dispense Refill  . albuterol (PROVENTIL HFA;VENTOLIN HFA) 108 (90 BASE) MCG/ACT inhaler Inhale 2 puffs into the lungs every 6 (six) hours as needed. For shortness of breath      . albuterol (PROVENTIL) (2.5 MG/3ML) 0.083% nebulizer solution Take 3 mLs (2.5 mg total) by nebulization 4 (four) times daily. Dx 496  360 mL  12  . aspirin 81 MG tablet Take 81 mg by mouth daily.      [name] atorvastatin (LIPITOR) 40 MG tablet Take 1 tablet (40 mg total) by mouth daily.  30 tablet  11  . budesonide (PULMICORT) 0.25 MG/2ML nebulizer solution Take 2 mLs (0.25 mg total) by nebulization 2 (two) times daily. Dx 496  120 mL  12  . carvedilol (COREG) 6.25 MG tablet Take 6.25 mg by mouth 2 (two) times daily with a meal.      . clopidogrel (PLAVIX) 75 MG tablet Take 1 tablet (75 mg total) by mouth daily with breakfast.  90 tablet  3  . clotrimazole (LOTRIMIN) 1 % cream Apply 1 application topically daily as needed (for rash around mid section).      [name] co-enzyme Q-10 30 MG capsule Take 30 mg by mouth daily.      Tery Sanfilippo Sodium  (STOOL SOFTENER) 100 MG capsule Take 100 mg by mouth daily as needed. constipation      . fentaNYL (DURAGESIC - DOSED MCG/HR) 25 MCG/HR Place 1 patch (25 mcg total) onto the skin every 3 (three) days.  10 patch  0  . gabapentin (NEURONTIN) 600 MG tablet Take 1 tablet (600 mg total) by mouth 3 (three) times daily.  90 tablet  4  . HYDROcodone-acetaminophen (NORCO) 10-325 MG per tablet Take 1 tablet by mouth every 6 (six) hours as needed for pain.  100 tablet  3  . insulin NPH (HUMULIN N,NOVOLIN N) 100 UNIT/ML injection Inject 70 Units into the skin daily before breakfast.      . Insulin Syringe-Needle U-100 (INSULIN SYRINGE 1CC/31GX5/16") 31G X 5/16" 1 ML MISC USE AS DIRECTED TO INJECT INSULIN FOUR TIMES DAILY  120 each  3  . ipratropium (ATROVENT) 0.02 % nebulizer solution Take 2.5 mLs (500 mcg total) by nebulization 4 (four) times daily. Dx 496  300 mL  12  . isosorbide mononitrate (IMDUR) 30 MG 24 hr tablet Take 1 tablet (30 mg total) by mouth daily.  30 tablet  6  . losartan (COZAAR) 50 MG tablet Take 50 mg by mouth daily.      . nitroGLYCERIN (NITROSTAT) 0.4 MG SL tablet Place 1 tablet (0.4 mg total) under the tongue every 5 (five) minutes as needed for chest pain.  25 tablet  5  . pantoprazole (PROTONIX) 40 MG tablet TAKE ONE TABLET OR TWO DAILY AS DIRECTED      . potassium chloride SA (K-DUR,KLOR-CON) 20 MEQ tablet Take 1 tablet (20 mEq total) by mouth 2 (two) times daily.  60 tablet  6  . silodosin (RAPAFLO) 8 MG CAPS capsule Take 8 mg by mouth daily with breakfast.      . spironolactone (ALDACTONE) 25 MG tablet Take 0.5 tablets (12.5 mg total) by mouth daily.  30 tablet  6  . torsemide (DEMADEX) 20 MG tablet Take 60 mg by mouth 2 (two) times daily.       No facility-administered encounter medications on file as of 03/21/2013.    Allergies  Allergen Reactions  . Sildenafil Nausea Only and Other (See Comments)    "took it once; heart went to pieces; never took it again"  . Percocet  [Oxycodone-Acetaminophen] Other (See Comments)    Unknown reaction-possible altered mental state-per wife.    . Contrast Media [Iodinated Diagnostic Agents] Nausea Only    Past Medical History  Diagnosis Date  . CORONARY ARTERY DISEASE 03/11/2007    s/p CABG 1998;  admitted 08/2011 with an inferior STEMI.  LHC 09/07/11: Severe  Three-vessel CAD, LIMA-LAD patent, SVG-RCA chronically occluded, SVG-circumflex chronically occluded, SVG-diagonal 95%.  PCI: Promus DES to the SVG-diagonal.  Procedure c/b transient CHB req CPR and TTVP;  myoview 4/13:large severe inferolateral infarct, no ischemia, EF 25%    . DIABETES MELLITUS, TYPE I 03/11/2007  . HYPERTENSION 03/11/2007  . 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 HYPERCHOLESTEROLEMIA 09/28/2008  . CERVICAL RADICULOPATHY, LEFT 09/23/2008  . PERIPHERAL NEUROPATHY 09/23/2008  . Chronic systolic heart failure 09/23/2008    Echocardiogram 09/07/11: Difficult study, inferior and posterior lateral wall hypokinesis, mild LVH, EF 45%, mild LAE.   [name] RENAL INSUFFICIENCY 03/15/2010  . ED (erectile dysfunction)   . Hypogonadism male   . GI bleed 2012    Multiple Duodenal ulcer   . Obesity   . Legally blind     bilaterally  . Stroke 73    age 3 months; residual right sided weakness  . History of blood transfusion 2012  . Charcot foot due to diabetes mellitus     chronic pain  . Osteoarthritis     right hip and shoulder  . COPD (chronic obstructive pulmonary disease)   . Myocardial infarction 2013b Jan  . PERIPHERAL VASCULAR INSUFFICIENCY,  LEGS, BILATERAL 08/17/2010    ABI's performed 04/19/11 R 0.72 L 0.71  . ANEMIA-IRON DEFICIENCY 09/23/2008    ulcer related last year  . Chronic edema     Chronic pedal edema   BP 100/56  Pulse 61  Ht 5\' 4"  (1.626 m)  Wt 107.956 kg (238 lb)  BMI 40.83 kg/m2  SpO2 95%  PHYSICAL EXAM: Pt is alert and oriented, pleasant, chronically ill-appearing obese gentleman in NAD HEENT: normal Neck: JVP - normal,  carotids 2+= without bruits Lungs: CTA bilaterally CV: RRR without murmur or gallop Abd: soft, NT, obese Ext: Diffuse edema with venous stasis ulcerations bilaterally Skin: warm/dry no rash  ASSESSMENT AND PLAN: 1. Chronic systolic heart failure, [place] Heart Association class III. Discussed dietary restrictions with the patient and his wife. Will continue current diuretic program. Check metabolic panel today. Followup in one month. Meds were reviewed and he is on an ARB, beta blocker, and Aldactone.  2. Coronary artery disease status post CABG and PCI. No angina at present. Continue current medical management.  3. Chronic kidney  disease, stage III. Patient is on an ARB. Avoid nephrotoxins.  Followup in one month.  Tonny Bollman 03/25/2013 5:55 AM

## 2013-03-23 ENCOUNTER — Encounter (HOSPITAL_COMMUNITY): Payer: Self-pay | Admitting: Emergency Medicine

## 2013-03-23 ENCOUNTER — Emergency Department (HOSPITAL_COMMUNITY): Payer: Medicare Other

## 2013-03-23 ENCOUNTER — Encounter: Payer: Self-pay | Admitting: Cardiovascular Disease

## 2013-03-23 ENCOUNTER — Emergency Department (HOSPITAL_COMMUNITY)
Admission: EM | Admit: 2013-03-23 | Discharge: 2013-03-23 | Disposition: A | Discharge status: Home / Self Care | Payer: Medicare Other | Attending: Emergency Medicine | Admitting: Emergency Medicine

## 2013-03-23 DIAGNOSIS — M1990 Unspecified osteoarthritis, unspecified site: Secondary | ICD-10-CM | POA: Insufficient documentation

## 2013-03-23 DIAGNOSIS — E669 Obesity, unspecified: Secondary | ICD-10-CM | POA: Insufficient documentation

## 2013-03-23 DIAGNOSIS — E109 Type 1 diabetes mellitus without complications: Secondary | ICD-10-CM | POA: Insufficient documentation

## 2013-03-23 DIAGNOSIS — Z79899 Other long term (current) drug therapy: Secondary | ICD-10-CM | POA: Insufficient documentation

## 2013-03-23 DIAGNOSIS — G609 Hereditary and idiopathic neuropathy, unspecified: Secondary | ICD-10-CM | POA: Insufficient documentation

## 2013-03-23 DIAGNOSIS — Z951 Presence of aortocoronary bypass graft: Secondary | ICD-10-CM | POA: Insufficient documentation

## 2013-03-23 DIAGNOSIS — R109 Unspecified abdominal pain: Secondary | ICD-10-CM | POA: Insufficient documentation

## 2013-03-23 DIAGNOSIS — N259 Disorder resulting from impaired renal tubular function, unspecified: Secondary | ICD-10-CM | POA: Insufficient documentation

## 2013-03-23 DIAGNOSIS — Z8739 Personal history of other diseases of the musculoskeletal system and connective tissue: Secondary | ICD-10-CM | POA: Insufficient documentation

## 2013-03-23 DIAGNOSIS — R609 Edema, unspecified: Secondary | ICD-10-CM | POA: Insufficient documentation

## 2013-03-23 DIAGNOSIS — G8929 Other chronic pain: Secondary | ICD-10-CM | POA: Insufficient documentation

## 2013-03-23 DIAGNOSIS — I5022 Chronic systolic (congestive) heart failure: Secondary | ICD-10-CM | POA: Insufficient documentation

## 2013-03-23 DIAGNOSIS — Z9889 Other specified postprocedural states: Secondary | ICD-10-CM | POA: Insufficient documentation

## 2013-03-23 DIAGNOSIS — I2510 Atherosclerotic heart disease of native coronary artery without angina pectoris: Secondary | ICD-10-CM | POA: Insufficient documentation

## 2013-03-23 DIAGNOSIS — Z8679 Personal history of other diseases of the circulatory system: Secondary | ICD-10-CM | POA: Insufficient documentation

## 2013-03-23 DIAGNOSIS — Z8639 Personal history of other endocrine, nutritional and metabolic disease: Secondary | ICD-10-CM | POA: Insufficient documentation

## 2013-03-23 DIAGNOSIS — E119 Type 2 diabetes mellitus without complications: Secondary | ICD-10-CM | POA: Insufficient documentation

## 2013-03-23 DIAGNOSIS — Z7982 Long term (current) use of aspirin: Secondary | ICD-10-CM | POA: Insufficient documentation

## 2013-03-23 DIAGNOSIS — E7800 Pure hypercholesterolemia, unspecified: Secondary | ICD-10-CM | POA: Insufficient documentation

## 2013-03-23 DIAGNOSIS — Z87448 Personal history of other diseases of urinary system: Secondary | ICD-10-CM | POA: Insufficient documentation

## 2013-03-23 DIAGNOSIS — J449 Chronic obstructive pulmonary disease, unspecified: Secondary | ICD-10-CM | POA: Insufficient documentation

## 2013-03-23 DIAGNOSIS — Z862 Personal history of diseases of the blood and blood-forming organs and certain disorders involving the immune mechanism: Secondary | ICD-10-CM | POA: Insufficient documentation

## 2013-03-23 DIAGNOSIS — Z86718 Personal history of other venous thrombosis and embolism: Secondary | ICD-10-CM | POA: Insufficient documentation

## 2013-03-23 DIAGNOSIS — I10 Essential (primary) hypertension: Secondary | ICD-10-CM | POA: Insufficient documentation

## 2013-03-23 DIAGNOSIS — J4489 Other specified chronic obstructive pulmonary disease: Secondary | ICD-10-CM | POA: Insufficient documentation

## 2013-03-23 DIAGNOSIS — I252 Old myocardial infarction: Secondary | ICD-10-CM | POA: Insufficient documentation

## 2013-03-23 DIAGNOSIS — Z8669 Personal history of other diseases of the nervous system and sense organs: Secondary | ICD-10-CM | POA: Insufficient documentation

## 2013-03-23 DIAGNOSIS — Z794 Long term (current) use of insulin: Secondary | ICD-10-CM | POA: Insufficient documentation

## 2013-03-23 DIAGNOSIS — Z8719 Personal history of other diseases of the digestive system: Secondary | ICD-10-CM | POA: Insufficient documentation

## 2013-03-23 LAB — LACTIC ACID, PLASMA: Lactic Acid, Venous: 1.1 mmol/L (ref 0.5–2.2)

## 2013-03-23 LAB — URINALYSIS, ROUTINE W REFLEX MICROSCOPIC
Glucose, UA: NEGATIVE mg/dL
Hgb urine dipstick: NEGATIVE
Ketones, ur: NEGATIVE mg/dL
Protein, ur: NEGATIVE mg/dL

## 2013-03-23 LAB — CBC
HCT: 37.1 % — ABNORMAL LOW (ref 39.0–52.0)
Hemoglobin: 12.5 g/dL — ABNORMAL LOW (ref 13.0–17.0)
MCH: 28.7 pg (ref 26.0–34.0)
MCHC: 33.7 g/dL (ref 30.0–36.0)

## 2013-03-23 LAB — COMPREHENSIVE METABOLIC PANEL
ALT: 19 U/L (ref 0–53)
AST: 22 U/L (ref 0–37)
Calcium: 9.1 mg/dL (ref 8.4–10.5)
Sodium: 130 mEq/L — ABNORMAL LOW (ref 135–145)
Total Protein: 6.9 g/dL (ref 6.0–8.3)

## 2013-03-23 MED ORDER — FAMOTIDINE 20 MG PO TABS: 20.0000 mg | ORAL_TABLET | Freq: Two times a day (BID) | ORAL | Status: DC

## 2013-03-23 NOTE — ED Provider Notes (Signed)
CSN: [id number]     Arrival date & time 03/23/13  1439 History     First MD Initiated Contact with Patient 03/23/13 1633     Chief Complaint  Patient presents with  . Abdominal Pain    Seen recently for a fall   (Consider location/radiation/quality/duration/timing/severity/associated sxs/prior Treatment) HPI  This is a 67 year old male who presents with abdominal pain. He has multiple medical problems including coronary artery disease, diabetes, renal insufficiency. The patient is currently on Plavix as well. He was seen several days ago for a fall off a scooter at that time he was evaluated for fractures and was found to be medically stable for discharge. The patient reports he had onset of abdominal pain last night. He reports that his across his abdomen. It does not radiate to his back. His current pain is 2/10. It is worse with eating.  The patient denies any nausea, vomiting, diarrhea. He denies any chest pain or shortness of breath the  Past Medical History  Diagnosis Date  . CORONARY ARTERY DISEASE 03/11/2007    s/p CABG 1998;  admitted 08/2011 with an inferior STEMI.  LHC 09/07/11: Severe  Three-vessel CAD, LIMA-LAD patent, SVG-RCA chronically occluded, SVG-circumflex chronically occluded, SVG-diagonal 95%.  PCI: Promus DES to the SVG-diagonal.  Procedure c/b transient CHB req CPR and TTVP;  myoview 4/13:large severe inferolateral infarct, no ischemia, EF 25%    . DIABETES MELLITUS, TYPE I 03/11/2007  . HYPERTENSION 03/11/2007  . 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 HYPERCHOLESTEROLEMIA 09/28/2008  . CERVICAL RADICULOPATHY, LEFT 09/23/2008  . PERIPHERAL NEUROPATHY 09/23/2008  . Chronic systolic heart failure 09/23/2008    Echocardiogram 09/07/11: Difficult study, inferior and posterior lateral wall hypokinesis, mild LVH, EF 45%, mild LAE.   [name] RENAL INSUFFICIENCY 03/15/2010  . ED (erectile dysfunction)   . Hypogonadism male   . GI bleed 2012    Multiple Duodenal ulcer   .  Obesity   . Legally blind     bilaterally  . Stroke 54    age 71 months; residual right sided weakness  . History of blood transfusion 2012  . Charcot foot due to diabetes mellitus     chronic pain  . Osteoarthritis     right hip and shoulder  . COPD (chronic obstructive pulmonary disease)   . Myocardial infarction 2013b Jan  . PERIPHERAL VASCULAR INSUFFICIENCY,  LEGS, BILATERAL 08/17/2010    ABI's performed 04/19/11 R 0.72 L 0.71  . ANEMIA-IRON DEFICIENCY 09/23/2008    ulcer related last year  . Chronic edema     Chronic pedal edema   Past Surgical History  Procedure Laterality Date  . Carpal tunnel release      left  . Sigmoidoscopy  03/11/2001  . Venous doppler  01/30/2004  . Tonsillectomy and adenoidectomy      "as a kid"  . Cataract extraction w/ intraocular lens  implant, bilateral    . Coronary angioplasty with stent placement  08/2011    "1"  . Retinopathy surgery  2000's    "laser; both eyes"  . Wrist fusion  1976    right  . Anterior fusion cervical spine  2010    "C spine; Dr. Yetta Barre"  . Ankle fracture surgery  1976; ?date    LEFT:  fused; removed bulk of hardware  . Heel spur surgery  ~ 2007    ? left  . Irrigation and debridement knee  02/27/2012    Procedure: IRRIGATION AND DEBRIDEMENT KNEE;  Surgeon: Verlee Rossetti,  MD;  Location: [facility] OR;  Service: Orthopedics;  Laterality: Right;  irrigation and drainage right knee septic bursitis  . Eye surgery  2008    cataract removal, cautery  . Coronary artery bypass graft  1998    CABG X5  . Fracture surgery      foot broken 1976   Family History  Problem Relation Age of Onset  . Heart disease Mother     Coronary Artery Disease  . Heart disease Father     Coronary Artery Disease  . Heart disease Paternal Grandmother     Coronary Artery Disease  . Colon cancer Neg Hx   . Diabetes Other     Grandmother   History  Substance Use Topics  . Smoking status: Never Smoker   . Smokeless tobacco: Never Used  .  Alcohol Use: No    Review of Systems  Constitutional: Negative.  Negative for fever.  Respiratory: Negative.  Negative for chest tightness and shortness of breath.   Cardiovascular: Negative.  Negative for chest pain.  Gastrointestinal: Negative.  Negative for abdominal pain.  Genitourinary: Negative.  Negative for dysuria.  Neurological: Negative for headaches.  All other systems reviewed and are negative.    Allergies  Sildenafil; Percocet; and Contrast media  Home Medications   Current Outpatient Rx  Name  Route  Sig  Dispense  Refill  . albuterol (PROVENTIL HFA;VENTOLIN HFA) 108 (90 BASE) MCG/ACT inhaler   Inhalation   Inhale 2 puffs into the lungs every 6 (six) hours as needed. For shortness of breath         . albuterol (PROVENTIL) (2.5 MG/3ML) 0.083% nebulizer solution   Nebulization   Take 2.5 mg by nebulization 2 (two) times daily.         [name] aspirin 81 MG tablet   Oral   Take 81 mg by mouth at bedtime.          [name] atorvastatin (LIPITOR) 40 MG tablet   Oral   Take 40 mg by mouth every evening.         . budesonide (PULMICORT) 0.25 MG/2ML nebulizer solution   Nebulization   Take 2 mLs (0.25 mg total) by nebulization 2 (two) times daily. Dx 496   120 mL   12   . carvedilol (COREG) 6.25 MG tablet   Oral   Take 6.25 mg by mouth 2 (two) times daily with a meal.         . clopidogrel (PLAVIX) 75 MG tablet   Oral   Take 1 tablet (75 mg total) by mouth daily with breakfast.   90 tablet   3   . clotrimazole (LOTRIMIN) 1 % cream   Topical   Apply 1 application topically daily as needed (for rash around mid section).         [name] co-enzyme Q-10 30 MG capsule   Oral   Take 30 mg by mouth daily.         Tery Sanfilippo Sodium (STOOL SOFTENER) 100 MG capsule   Oral   Take 100 mg by mouth daily as needed. constipation         . fentaNYL (DURAGESIC - DOSED MCG/HR) 25 MCG/HR   Transdermal   Place 1 patch (25 mcg total) onto the skin every 3 (three)  days.   10 patch   0   . gabapentin (NEURONTIN) 600 MG tablet   Oral   Take 1 tablet (600 mg total) by mouth 3 (three) times daily.  90 tablet   4   . HYDROcodone-acetaminophen (NORCO) 10-325 MG per tablet   Oral   Take 1 tablet by mouth every 6 (six) hours as needed for pain.   100 tablet   3   . insulin NPH (HUMULIN N,NOVOLIN N) 100 UNIT/ML injection   Subcutaneous   Inject 70 Units into the skin daily before breakfast.         . ipratropium (ATROVENT) 0.02 % nebulizer solution   Nebulization   Take 500 mcg by nebulization 2 (two) times daily.         . isosorbide mononitrate (IMDUR) 30 MG 24 hr tablet   Oral   Take 1 tablet (30 mg total) by mouth daily.   30 tablet   6   . losartan (COZAAR) 50 MG tablet   Oral   Take 50 mg by mouth every morning.          . naproxen (NAPROSYN) 500 MG tablet   Oral   Take 1 tablet (500 mg total) by mouth 2 (two) times daily with a meal.   30 tablet   0   . pantoprazole (PROTONIX) 40 MG tablet   Oral   Take 40 mg by mouth daily.          . potassium chloride SA (K-DUR,KLOR-CON) 20 MEQ tablet   Oral   Take 1 tablet (20 mEq total) by mouth 2 (two) times daily.   60 tablet   6   . silodosin (RAPAFLO) 8 MG CAPS capsule   Oral   Take 8 mg by mouth every evening.          [name] spironolactone (ALDACTONE) 25 MG tablet   Oral   Take 0.5 tablets (12.5 mg total) by mouth daily.   30 tablet   6     pls disregard 45 day supply. Thanks [place] CMA   . torsemide (DEMADEX) 20 MG tablet   Oral   Take 60 mg by mouth 2 (two) times daily.         . famotidine (PEPCID) 20 MG tablet   Oral   Take 1 tablet (20 mg total) by mouth 2 (two) times daily.   30 tablet   0   . nitroGLYCERIN (NITROSTAT) 0.4 MG SL tablet   Sublingual   Place 1 tablet (0.4 mg total) under the tongue every 5 (five) minutes as needed for chest pain.   25 tablet   5    BP 122/56  Pulse 62  Temp(Src) 97.9 F (36.6 C) (Oral)  Resp 20  Ht 5\' 4"   (1.626 m)  Wt 230 lb (104.327 kg)  BMI 39.46 kg/m2  SpO2 98% Physical Exam  Nursing note and vitals reviewed. Constitutional: He is oriented to person, place, and time. He appears well-developed and well-nourished. No distress.  HENT:  Head: Normocephalic and atraumatic.  Eyes: Pupils are equal, round, and reactive to light.  Neck: Neck supple.  Cardiovascular: Normal rate, regular rhythm and normal heart sounds.   No murmur heard. Pulmonary/Chest: Effort normal and breath sounds normal. No respiratory distress. He has no wheezes.  Abdominal: Soft. Bowel sounds are normal. There is no tenderness. There is no rebound and no guarding.  Musculoskeletal: Normal range of motion. He exhibits edema.       Right ankle: He exhibits swelling.       Left ankle: He exhibits swelling.  Ecchymosis over the medial left thigh and just distal to the left knee.  Lymphadenopathy:  He has no cervical adenopathy.  Neurological: He is alert and oriented to person, place, and time.  Skin: Skin is warm and dry.  Psychiatric: He has a normal mood and affect.    ED Course   Procedures (including critical care time)  Labs Reviewed  COMPREHENSIVE METABOLIC PANEL - Abnormal; Notable for the following:    Sodium 130 (*)    Chloride 91 (*)    BUN 34 (*)    Creatinine, Ser 1.62 (*)    Albumin 3.4 (*)    GFR calc non Af Amer 42 (*)    GFR calc Af Amer 48 (*)    All other components within normal limits  CBC - Abnormal; Notable for the following:    Hemoglobin 12.5 (*)    HCT 37.1 (*)    All other components within normal limits  URINALYSIS, ROUTINE W REFLEX MICROSCOPIC - Abnormal; Notable for the following:    APPearance CLOUDY (*)    All other components within normal limits  LACTIC ACID, PLASMA  LIPASE, BLOOD   Ct Abdomen Pelvis Wo Contrast  03/23/2013   *RADIOLOGY REPORT*  Clinical Data: Abdominal pain; recent trauma  CT ABDOMEN AND PELVIS WITHOUT CONTRAST  Technique:  Multidetector CT imaging  of the abdomen and pelvis was performed following the standard protocol without oral or intravenous contrast. Intravenous contrast was not administered due to history of allergic reaction to iodinated contrast material.  Comparison: CT pelvis September 12, 2011 and CT abdomen and pelvis February 20, 2005  Findings: There is mild scarring in the left lung base.  Lung bases are otherwise clear. There is a small hiatal hernia.  No focal liver lesions are identified on this noncontrast enhanced study.  There is no apparent liver laceration or rupture.  There is no perihepatic fluid.  There is no appreciable biliary duct dilatation.  Spleen appears intact without laceration or rupture on this noncontrast enhanced study.  No focal splenic lesions are identified.  Pancreas and adrenals appear normal.  Kidneys bilaterally show no hydronephrosis or mass.  There is no calculus in either kidney.  No traumatic appearing lesion is seen involving either kidney.  There is no ureteral calculus or ureterectasis on either side.  There is a small ventral hernia containing only fat.  In the pelvis, there is no mass or fluid collection.  There is no periappendiceal region inflammation.  There is no bowel obstruction.  No free air or portal venous air. There is no ascites, adenopathy, or abscess in the pelvis.  There is no mesenteric or bowel wall thickening.  There is no lesion involving the abdominal wall.  There is atherosclerotic change in the aorta but no aneurysm. There is degenerative type change in the lumbar spine.  There is evidence of old trauma involving the left iliac crest.  No acute appearing fracture is seen.  IMPRESSION:  No traumatic appearing lesion is seen.  No inflammatory focus appreciated.  There is a small hiatal hernia as well as a small ventral hernia. The ventral hernia only contains fat.  There is atherosclerotic change in the aorta.  No renal or ureteral calculus.  No hydronephrosis.  Comment:  The absence of  intravenous contrast does make evaluation for more subtle traumatic lesion is less than ideal.   Original Report Authenticated By: Bretta Bang, M.D.   1. Abdominal pain     MDM  This is a 67 year old male who presents with abdominal pain. He is nontoxic-appearing on exam and his initial  vital signs were notable for a systolic blood pressure of 93. Reviewed reveals that the patient's usual blood pressure is in the low 100s. Repeat blood pressure was reassuring. The patient had a fall several days ago and was evaluated here.  He is on Plavix which would put him at risk for bleeding, however the patient is well-appearing and abdominal exam is benign. Basic lab work was obtained and is at the patient's baseline including his hematocrit. A noncontrasted CT scan of the abdomen was obtained as the patient has chronic kidney injury. CT scan of the abdomen reveals small hiatal and a small ventral hernia but no evidence of bleeding. While a CT scan that is noncontrasted is suboptimal in this setting to evaluate for traumatic bleed, I feel given the negative noncontrasted scan in the setting of a stable hematocrit is reassuring. The patient continues to be stable in the emergency room. The examination is also reassuring. The patient's symptoms could be related to his reflux and hiatal hernia. I added Pepcid to his regimen. He will be discharged home with PCP followup.    After history, exam, and medical workup I feel the patient has been appropriately medically screened and is safe for discharge home. Pertinent diagnoses were discussed with the patient. Patient was given return precautions.  Shon Baton, MD 03/23/13 [phone]

## 2013-03-23 NOTE — ED Notes (Signed)
Pt alert, arrives from home, c/o abd pain, onset several days ago after a fall injury, denies n/v or sob,  resp even unlabored, skin pwd,

## 2013-03-23 NOTE — ED Notes (Signed)
Patient transported to CT 

## 2013-03-23 NOTE — ED Notes (Addendum)
Spoke with Ross Marcus, PA;  She gave verbal order for 650mg  Tylenol, but pt refused.  New BP is 122/56 (73)

## 2013-03-24 ENCOUNTER — Other Ambulatory Visit: Payer: Self-pay | Admitting: Endocrinology

## 2013-03-25 MED ORDER — CARVEDILOL 6.25 MG PO TABS: 6.2500 mg | ORAL_TABLET | Freq: Two times a day (BID) | ORAL | Status: DC

## 2013-03-26 ENCOUNTER — Encounter (HOSPITAL_COMMUNITY): Payer: Self-pay | Admitting: Emergency Medicine

## 2013-03-26 ENCOUNTER — Emergency Department (HOSPITAL_COMMUNITY): Payer: Medicare Other

## 2013-03-26 ENCOUNTER — Encounter (HOSPITAL_COMMUNITY): Payer: Self-pay | Admitting: *Deleted

## 2013-03-26 ENCOUNTER — Inpatient Hospital Stay (HOSPITAL_COMMUNITY)
Admission: EM | Admit: 2013-03-26 | Discharge: 2013-03-29 | DRG: 312 | Disposition: A | Discharge status: Home / Self Care | Payer: Medicare Other | Attending: Internal Medicine | Admitting: Internal Medicine

## 2013-03-26 ENCOUNTER — Emergency Department (HOSPITAL_COMMUNITY)
Admission: EM | Admit: 2013-03-26 | Discharge: 2013-03-26 | Disposition: A | Discharge status: Home / Self Care | Payer: Medicare Other | Source: Home / Self Care | Attending: Emergency Medicine | Admitting: Emergency Medicine

## 2013-03-26 DIAGNOSIS — J449 Chronic obstructive pulmonary disease, unspecified: Secondary | ICD-10-CM

## 2013-03-26 DIAGNOSIS — L02419 Cutaneous abscess of limb, unspecified: Secondary | ICD-10-CM | POA: Diagnosis present

## 2013-03-26 DIAGNOSIS — I739 Peripheral vascular disease, unspecified: Secondary | ICD-10-CM

## 2013-03-26 DIAGNOSIS — E1142 Type 2 diabetes mellitus with diabetic polyneuropathy: Secondary | ICD-10-CM | POA: Diagnosis present

## 2013-03-26 DIAGNOSIS — I509 Heart failure, unspecified: Secondary | ICD-10-CM | POA: Diagnosis present

## 2013-03-26 DIAGNOSIS — N400 Enlarged prostate without lower urinary tract symptoms: Secondary | ICD-10-CM

## 2013-03-26 DIAGNOSIS — Z125 Encounter for screening for malignant neoplasm of prostate: Secondary | ICD-10-CM

## 2013-03-26 DIAGNOSIS — J4489 Other specified chronic obstructive pulmonary disease: Secondary | ICD-10-CM | POA: Diagnosis present

## 2013-03-26 DIAGNOSIS — E1049 Type 1 diabetes mellitus with other diabetic neurological complication: Secondary | ICD-10-CM

## 2013-03-26 DIAGNOSIS — D509 Iron deficiency anemia, unspecified: Secondary | ICD-10-CM

## 2013-03-26 DIAGNOSIS — G609 Hereditary and idiopathic neuropathy, unspecified: Secondary | ICD-10-CM

## 2013-03-26 DIAGNOSIS — E669 Obesity, unspecified: Secondary | ICD-10-CM | POA: Diagnosis present

## 2013-03-26 DIAGNOSIS — M25552 Pain in left hip: Secondary | ICD-10-CM

## 2013-03-26 DIAGNOSIS — H543 Unqualified visual loss, both eyes: Secondary | ICD-10-CM | POA: Diagnosis present

## 2013-03-26 DIAGNOSIS — M79609 Pain in unspecified limb: Secondary | ICD-10-CM

## 2013-03-26 DIAGNOSIS — R55 Syncope and collapse: Secondary | ICD-10-CM

## 2013-03-26 DIAGNOSIS — I10 Essential (primary) hypertension: Secondary | ICD-10-CM

## 2013-03-26 DIAGNOSIS — Z951 Presence of aortocoronary bypass graft: Secondary | ICD-10-CM

## 2013-03-26 DIAGNOSIS — E1139 Type 2 diabetes mellitus with other diabetic ophthalmic complication: Secondary | ICD-10-CM | POA: Diagnosis present

## 2013-03-26 DIAGNOSIS — N259 Disorder resulting from impaired renal tubular function, unspecified: Secondary | ICD-10-CM | POA: Diagnosis present

## 2013-03-26 DIAGNOSIS — Z981 Arthrodesis status: Secondary | ICD-10-CM

## 2013-03-26 DIAGNOSIS — Z8679 Personal history of other diseases of the circulatory system: Secondary | ICD-10-CM

## 2013-03-26 DIAGNOSIS — L97909 Non-pressure chronic ulcer of unspecified part of unspecified lower leg with unspecified severity: Secondary | ICD-10-CM

## 2013-03-26 DIAGNOSIS — I5023 Acute on chronic systolic (congestive) heart failure: Secondary | ICD-10-CM

## 2013-03-26 DIAGNOSIS — I25810 Atherosclerosis of coronary artery bypass graft(s) without angina pectoris: Secondary | ICD-10-CM

## 2013-03-26 DIAGNOSIS — I2510 Atherosclerotic heart disease of native coronary artery without angina pectoris: Secondary | ICD-10-CM | POA: Diagnosis present

## 2013-03-26 DIAGNOSIS — I129 Hypertensive chronic kidney disease with stage 1 through stage 4 chronic kidney disease, or unspecified chronic kidney disease: Secondary | ICD-10-CM | POA: Diagnosis present

## 2013-03-26 DIAGNOSIS — M1610 Unilateral primary osteoarthritis, unspecified hip: Secondary | ICD-10-CM | POA: Diagnosis present

## 2013-03-26 DIAGNOSIS — IMO0001 Reserved for inherently not codable concepts without codable children: Secondary | ICD-10-CM

## 2013-03-26 DIAGNOSIS — M71161 Other infective bursitis, right knee: Secondary | ICD-10-CM

## 2013-03-26 DIAGNOSIS — J309 Allergic rhinitis, unspecified: Secondary | ICD-10-CM

## 2013-03-26 DIAGNOSIS — M5412 Radiculopathy, cervical region: Secondary | ICD-10-CM

## 2013-03-26 DIAGNOSIS — S8012XA Contusion of left lower leg, initial encounter: Secondary | ICD-10-CM

## 2013-03-26 DIAGNOSIS — R079 Chest pain, unspecified: Secondary | ICD-10-CM | POA: Diagnosis present

## 2013-03-26 DIAGNOSIS — M169 Osteoarthritis of hip, unspecified: Secondary | ICD-10-CM | POA: Diagnosis present

## 2013-03-26 DIAGNOSIS — H548 Legal blindness, as defined in USA: Secondary | ICD-10-CM

## 2013-03-26 DIAGNOSIS — M19019 Primary osteoarthritis, unspecified shoulder: Secondary | ICD-10-CM | POA: Diagnosis present

## 2013-03-26 DIAGNOSIS — I252 Old myocardial infarction: Secondary | ICD-10-CM

## 2013-03-26 DIAGNOSIS — S2239XA Fracture of one rib, unspecified side, initial encounter for closed fracture: Secondary | ICD-10-CM | POA: Diagnosis present

## 2013-03-26 DIAGNOSIS — E11319 Type 2 diabetes mellitus with unspecified diabetic retinopathy without macular edema: Secondary | ICD-10-CM | POA: Diagnosis present

## 2013-03-26 DIAGNOSIS — Z9861 Coronary angioplasty status: Secondary | ICD-10-CM

## 2013-03-26 DIAGNOSIS — E291 Testicular hypofunction: Secondary | ICD-10-CM

## 2013-03-26 DIAGNOSIS — F329 Major depressive disorder, single episode, unspecified: Secondary | ICD-10-CM

## 2013-03-26 DIAGNOSIS — W19XXXA Unspecified fall, initial encounter: Secondary | ICD-10-CM

## 2013-03-26 DIAGNOSIS — R5381 Other malaise: Secondary | ICD-10-CM

## 2013-03-26 DIAGNOSIS — E119 Type 2 diabetes mellitus without complications: Secondary | ICD-10-CM

## 2013-03-26 DIAGNOSIS — I200 Unstable angina: Secondary | ICD-10-CM

## 2013-03-26 DIAGNOSIS — E11359 Type 2 diabetes mellitus with proliferative diabetic retinopathy without macular edema: Secondary | ICD-10-CM

## 2013-03-26 DIAGNOSIS — N183 Chronic kidney disease, stage 3 unspecified: Secondary | ICD-10-CM | POA: Diagnosis present

## 2013-03-26 DIAGNOSIS — L0390 Cellulitis, unspecified: Secondary | ICD-10-CM

## 2013-03-26 DIAGNOSIS — E7800 Pure hypercholesterolemia, unspecified: Secondary | ICD-10-CM

## 2013-03-26 DIAGNOSIS — R200 Anesthesia of skin: Secondary | ICD-10-CM

## 2013-03-26 DIAGNOSIS — R0781 Pleurodynia: Secondary | ICD-10-CM

## 2013-03-26 DIAGNOSIS — R609 Edema, unspecified: Secondary | ICD-10-CM

## 2013-03-26 DIAGNOSIS — I5022 Chronic systolic (congestive) heart failure: Secondary | ICD-10-CM | POA: Diagnosis present

## 2013-03-26 LAB — CBC
Hemoglobin: 12.5 g/dL — ABNORMAL LOW (ref 13.0–17.0)
MCH: 28.6 pg (ref 26.0–34.0)
RBC: 4.37 MIL/uL (ref 4.22–5.81)

## 2013-03-26 LAB — GLUCOSE, CAPILLARY: Glucose-Capillary: 120 mg/dL — ABNORMAL HIGH (ref 70–99)

## 2013-03-26 MED ORDER — ONDANSETRON HCL 4 MG/2ML IJ SOLN
4.0000 mg | Freq: Once | INTRAMUSCULAR | Status: AC
  Administered 2013-03-26: 4 mg via INTRAVENOUS
  Filled 2013-03-26: qty 2

## 2013-03-26 MED ORDER — MORPHINE SULFATE 4 MG/ML IJ SOLN: 4.0000 mg | Freq: Once | INTRAMUSCULAR | Status: DC

## 2013-03-26 MED ORDER — HYDROCODONE-ACETAMINOPHEN 5-325 MG PO TABS
2.0000 | ORAL_TABLET | Freq: Once | ORAL | Status: AC
  Administered 2013-03-27: 2 via ORAL
  Filled 2013-03-26: qty 2

## 2013-03-26 MED ORDER — HYDROCODONE-ACETAMINOPHEN 5-325 MG PO TABS
1.0000 | ORAL_TABLET | Freq: Once | ORAL | Status: AC
  Administered 2013-03-26: 2 via ORAL
  Filled 2013-03-26: qty 2

## 2013-03-26 NOTE — ED Provider Notes (Signed)
CSN: [id number]     Arrival date & time 03/26/13  2118 History     First MD Initiated Contact with Patient 03/26/13 2148     Chief Complaint  Patient presents with  . Fall   (Consider location/radiation/quality/duration/timing/severity/associated sxs/prior Treatment) HPI Comments: 67 yo male with dementia, htn, copd, plavix presents after fall at home PTA.  Pt was using cane, normally uses motorized and felt weak and fell on left side.  No cp, ha or other sxs prior.  Pt has had multiple falls due to weakness/ neuropathy.  Left flank pain and mild head injury.  Chronic leg swelling and bruising from previous falls.  Pain with palpation.   Patient is a 67 y.o. male presenting with fall. The history is provided by the patient and the spouse.  Fall This is a new problem. Associated symptoms include abdominal pain (left flank). Pertinent negatives include no chest pain, no headaches and no shortness of breath.    Past Medical History  Diagnosis Date  . CORONARY ARTERY DISEASE 03/11/2007    s/p CABG 1998;  admitted 08/2011 with an inferior STEMI.  LHC 09/07/11: Severe  Three-vessel CAD, LIMA-LAD patent, SVG-RCA chronically occluded, SVG-circumflex chronically occluded, SVG-diagonal 95%.  PCI: Promus DES to the SVG-diagonal.  Procedure c/b transient CHB req CPR and TTVP;  myoview 4/13:large severe inferolateral infarct, no ischemia, EF 25%    . DIABETES MELLITUS, TYPE I 03/11/2007  . HYPERTENSION 03/11/2007  . 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 HYPERCHOLESTEROLEMIA 09/28/2008  . CERVICAL RADICULOPATHY, LEFT 09/23/2008  . PERIPHERAL NEUROPATHY 09/23/2008  . Chronic systolic heart failure 09/23/2008    Echocardiogram 09/07/11: Difficult study, inferior and posterior lateral wall hypokinesis, mild LVH, EF 45%, mild LAE.   [name] RENAL INSUFFICIENCY 03/15/2010  . ED (erectile dysfunction)   . Hypogonadism male   . GI bleed 2012    Multiple Duodenal ulcer   . Obesity   . Legally blind    bilaterally  . Stroke 53    age 55 months; residual right sided weakness  . History of blood transfusion 2012  . Charcot foot due to diabetes mellitus     chronic pain  . Osteoarthritis     right hip and shoulder  . COPD (chronic obstructive pulmonary disease)   . Myocardial infarction 2013b Jan  . PERIPHERAL VASCULAR INSUFFICIENCY,  LEGS, BILATERAL 08/17/2010    ABI's performed 04/19/11 R 0.72 L 0.71  . ANEMIA-IRON DEFICIENCY 09/23/2008    ulcer related last year  . Chronic edema     Chronic pedal edema   Past Surgical History  Procedure Laterality Date  . Carpal tunnel release      left  . Sigmoidoscopy  03/11/2001  . Venous doppler  01/30/2004  . Tonsillectomy and adenoidectomy      "as a kid"  . Cataract extraction w/ intraocular lens  implant, bilateral    . Coronary angioplasty with stent placement  08/2011    "1"  . Retinopathy surgery  2000's    "laser; both eyes"  . Wrist fusion  1976    right  . Anterior fusion cervical spine  2010    "C spine; Dr. Yetta Barre"  . Ankle fracture surgery  1976; ?date    LEFT:  fused; removed bulk of hardware  . Heel spur surgery  ~ 2007    ? left  . Irrigation and debridement knee  02/27/2012    Procedure: IRRIGATION AND DEBRIDEMENT KNEE;  Surgeon: Verlee Rossetti, MD;  Location: [facility]  OR;  Service: Orthopedics;  Laterality: Right;  irrigation and drainage right knee septic bursitis  . Eye surgery  2008    cataract removal, cautery  . Coronary artery bypass graft  1998    CABG X5  . Fracture surgery      foot broken 1976   Family History  Problem Relation Age of Onset  . Heart disease Mother     Coronary Artery Disease  . Heart disease Father     Coronary Artery Disease  . Heart disease Paternal Grandmother     Coronary Artery Disease  . Colon cancer Neg Hx   . Diabetes Other     Grandmother   History  Substance Use Topics  . Smoking status: Never Smoker   . Smokeless tobacco: Never Used  . Alcohol Use: No    Review of  Systems  Constitutional: Positive for fatigue. Negative for fever and chills.  HENT: Negative for neck pain and neck stiffness.   Respiratory: Negative for shortness of breath.   Cardiovascular: Negative for chest pain.  Gastrointestinal: Positive for abdominal pain (left flank). Negative for vomiting.  Genitourinary: Positive for flank pain.  Musculoskeletal: Positive for back pain (upper).  Skin: Positive for color change and wound.  Neurological: Negative for syncope and headaches.    Allergies  Sildenafil; Percocet; and Contrast media  Home Medications   Current Outpatient Rx  Name  Route  Sig  Dispense  Refill  . albuterol (PROVENTIL HFA;VENTOLIN HFA) 108 (90 BASE) MCG/ACT inhaler   Inhalation   Inhale 2 puffs into the lungs every 6 (six) hours as needed for wheezing or shortness of breath.          [name] albuterol (PROVENTIL) (2.5 MG/3ML) 0.083% nebulizer solution   Nebulization   Take 2.5 mg by nebulization every 6 (six) hours as needed for wheezing or shortness of breath.          [name] aspirin 81 MG tablet   Oral   Take 81 mg by mouth at bedtime.          [name] atorvastatin (LIPITOR) 40 MG tablet   Oral   Take 40 mg by mouth every evening.         . budesonide (PULMICORT) 0.25 MG/2ML nebulizer solution   Nebulization   Take 2 mLs (0.25 mg total) by nebulization 2 (two) times daily. Dx 496   120 mL   12   . carvedilol (COREG) 6.25 MG tablet   Oral   Take 1 tablet (6.25 mg total) by mouth 2 (two) times daily with a meal.   60 tablet   6   . clopidogrel (PLAVIX) 75 MG tablet   Oral   Take 1 tablet (75 mg total) by mouth daily with breakfast.   90 tablet   3   . co-enzyme Q-10 30 MG capsule   Oral   Take 30 mg by mouth daily.         Tery Sanfilippo Sodium (STOOL SOFTENER) 100 MG capsule   Oral   Take 100 mg by mouth daily as needed for constipation.          . gabapentin (NEURONTIN) 600 MG tablet   Oral   Take 1 tablet (600 mg total) by mouth 3 (three)  times daily.   90 tablet   4   . HYDROcodone-acetaminophen (NORCO) 10-325 MG per tablet   Oral   Take 1 tablet by mouth every 6 (six) hours as needed for pain.  100 tablet   3   . insulin NPH (HUMULIN N,NOVOLIN N) 100 UNIT/ML injection   Subcutaneous   Inject 70 Units into the skin daily before breakfast.         . ipratropium (ATROVENT) 0.02 % nebulizer solution   Nebulization   Take 500 mcg by nebulization 2 (two) times daily.         . isosorbide mononitrate (IMDUR) 30 MG 24 hr tablet   Oral   Take 1 tablet (30 mg total) by mouth daily.   30 tablet   6   . losartan (COZAAR) 50 MG tablet   Oral   Take 50 mg by mouth every morning.          . nitroGLYCERIN (NITROSTAT) 0.4 MG SL tablet   Sublingual   Place 1 tablet (0.4 mg total) under the tongue every 5 (five) minutes as needed for chest pain.   25 tablet   5   . pantoprazole (PROTONIX) 40 MG tablet   Oral   Take 40 mg by mouth daily.          . potassium chloride SA (K-DUR,KLOR-CON) 20 MEQ tablet   Oral   Take 1 tablet (20 mEq total) by mouth 2 (two) times daily.   60 tablet   6   . silodosin (RAPAFLO) 8 MG CAPS capsule   Oral   Take 8 mg by mouth every evening.          [name] spironolactone (ALDACTONE) 25 MG tablet   Oral   Take 0.5 tablets (12.5 mg total) by mouth daily.   30 tablet   6     pls disregard 45 day supply. Thanks [place] CMA   . torsemide (DEMADEX) 20 MG tablet   Oral   Take 60 mg by mouth 2 (two) times daily.          BP 121/66  Pulse 66  Temp(Src) 98.4 F (36.9 C) (Oral)  SpO2 100% Physical Exam  Nursing note and vitals reviewed. Constitutional: He is oriented to person, place, and time. He appears well-developed. No distress.  HENT:  Head: Normocephalic and atraumatic.  Eyes: Conjunctivae are normal. Right eye exhibits no discharge. Left eye exhibits no discharge.  Neck: Normal range of motion. Neck supple. No tracheal deviation present.  Cardiovascular: Normal  rate and regular rhythm.   Pulmonary/Chest: Effort normal and breath sounds normal.  Abdominal: Soft. He exhibits no distension. There is tenderness (mild left flank, obese). There is no guarding.  Musculoskeletal: He exhibits edema and tenderness.  g weakness Tender left lower ribs mild Tender mid thoracic, mild, no stepoff  Neurological: He is alert and oriented to person, place, and time.  Skin: Skin is warm. No rash noted. There is erythema (ecchymosis left thigh, lower legs bilateral anterior).  Psychiatric: He has a normal mood and affect.    ED Course   Procedures (including critical care time)  Ultrasound limited abdominal and limited transthoracic ultrasound (FAST)  Indication: left flank pain, fall Four views were obtained using the low frequency transducer: Splenorenal, Hepatorenal, Retrovesical, Interpretation: No free fluid visualized surrounding the kidneys, pelvis  Images archived electronically Dr. Jodi Mourning personally performed and interpreted the images   Labs Reviewed  CBC WITH DIFFERENTIAL - Abnormal; Notable for the following:    Hemoglobin 12.4 (*)    HCT 36.8 (*)    Lymphocytes Relative 11 (*)    All other components within normal limits  URINALYSIS, ROUTINE W REFLEX MICROSCOPIC  BASIC METABOLIC  PANEL   Dg Tibia/fibula Left  03/26/2013   *RADIOLOGY REPORT*  Clinical Data: Recent traumatic injury with pain  LEFT TIBIA AND FIBULA - 2 VIEW  Comparison: 04/26/2012  Findings: Postsurgical changes are again noted in the left foot. No acute fracture or dislocation is noted.  No soft tissue abnormality is seen.  IMPRESSION: No acute abnormality noted.   Original Report Authenticated By: Alcide Clever, M.D.   No diagnosis found.  MDM  Screening labs for cause of g weakness.  Likely worsening of chronic dz. Xrays ordered for pain. CT for head injury and blood thinners.  Dg Chest 2 View  03/27/2013   *RADIOLOGY REPORT*  Clinical Data: Mid to lower thoracic spine  pain and chest pain secondary to a fall.  CHEST - 2 VIEW  Comparison: 03/01/2013  Findings: Heart size and pulmonary vascularity are normal.  Prior CABG.  Minimal scarring in the lingula.  Lungs are otherwise clear. No effusions. The thoracic spine appears normal.  IMPRESSION: No acute abnormality.   Original Report Authenticated By: Francene Boyers, M.D.   Dg Thoracic Spine W/swimmers  03/27/2013   *RADIOLOGY REPORT*  Clinical Data: Mid to lower thoracic spine pain secondary to a fall.  THORACIC SPINE - 2 VIEW + SWIMMERS  Comparison: None.  Findings: There is no fracture or bone destruction or subluxation. There are slight degenerative changes in the mid to lower thoracic spine.  IMPRESSION: No acute abnormality of the thoracic spine.   Original Report Authenticated By: Francene Boyers, M.D.   Dg Tibia/fibula Left  03/26/2013   *RADIOLOGY REPORT*  Clinical Data: Recent traumatic injury with pain  LEFT TIBIA AND FIBULA - 2 VIEW  Comparison: 04/26/2012  Findings: Postsurgical changes are again noted in the left foot. No acute fracture or dislocation is noted.  No soft tissue abnormality is seen.  IMPRESSION: No acute abnormality noted.   Original Report Authenticated By: Alcide Clever, M.D.   Ct Head Wo Contrast  03/27/2013   *RADIOLOGY REPORT*  Clinical Data: Trauma secondary to a fall.  CT HEAD WITHOUT CONTRAST  Technique:  Contiguous axial images were obtained from the base of the skull through the vertex without contrast.  Comparison: 08/29/2011  Findings: There is no acute intracranial hemorrhage, infarction, or mass lesion.  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 There is abnormal lucency in the pons consistent with a chronic ischemic change.  No acute osseous abnormality.  IMPRESSION: No acute abnormality.  Old left parietal infarct.  Old ischemic changes in the pons, stable.   Original Report Authenticated By: Francene Boyers, M.D.   Close fup discussed, pt will need further  assistance at home or assisted living, wife with pt. Plan to walk and get pt up.  If unable he may need to come in for observation/ pain control.   Frequent falls, rib contusion, general deconditioning, neuropathy    Enid Skeens, MD 03/27/13 [phone]
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
## 2013-03-26 NOTE — ED Notes (Signed)
Pt arrived from home via [facility] due to fall from wheel chair. C/o Lt rib pain and Rt wrist pain. Hx: Dementia, HTN, COPD, MI. Allergy to viagra. EMS VS BP 144/72, HR 70, RR 20, 100 % on [place] 2 L/min. No deformities noted
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
## 2013-03-26 NOTE — ED Notes (Signed)
Pt to ct 
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
## 2013-03-27 ENCOUNTER — Encounter (HOSPITAL_COMMUNITY): Payer: Self-pay | Admitting: Internal Medicine

## 2013-03-27 ENCOUNTER — Ambulatory Visit: Payer: Medicare Other | Admitting: Internal Medicine

## 2013-03-27 ENCOUNTER — Observation Stay (HOSPITAL_COMMUNITY): Payer: Medicare Other

## 2013-03-27 ENCOUNTER — Inpatient Hospital Stay (HOSPITAL_COMMUNITY): Payer: Medicare Other

## 2013-03-27 ENCOUNTER — Other Ambulatory Visit: Payer: Self-pay

## 2013-03-27 DIAGNOSIS — R0781 Pleurodynia: Secondary | ICD-10-CM

## 2013-03-27 DIAGNOSIS — R55 Syncope and collapse: Principal | ICD-10-CM

## 2013-03-27 LAB — URINALYSIS, ROUTINE W REFLEX MICROSCOPIC
Glucose, UA: NEGATIVE mg/dL
Leukocytes, UA: NEGATIVE
Nitrite: NEGATIVE
pH: 7 (ref 5.0–8.0)

## 2013-03-27 LAB — COMPREHENSIVE METABOLIC PANEL
ALT: 16 U/L (ref 0–53)
Alkaline Phosphatase: 77 U/L (ref 39–117)
BUN: 36 mg/dL — ABNORMAL HIGH (ref 6–23)
CO2: 25 mEq/L (ref 19–32)
GFR calc Af Amer: 50 mL/min — ABNORMAL LOW (ref >90)
GFR calc non Af Amer: 43 mL/min — ABNORMAL LOW (ref >90)
Glucose, Bld: 86 mg/dL (ref 70–99)
Potassium: 4.3 mEq/L (ref 3.5–5.1)
Sodium: 131 mEq/L — ABNORMAL LOW (ref 135–145)

## 2013-03-27 LAB — CBC
HCT: 37.5 % — ABNORMAL LOW (ref 39.0–52.0)
Hemoglobin: 13 g/dL (ref 13.0–17.0)
MCH: 29.4 pg (ref 26.0–34.0)
MCHC: 34.7 g/dL (ref 30.0–36.0)
MCV: 84.8 fL (ref 78.0–100.0)
Platelets: 188 K/uL (ref 150–400)
RBC: 4.42 MIL/uL (ref 4.22–5.81)
RDW: 14.9 % (ref 11.5–15.5)
WBC: 8 K/uL (ref 4.0–10.5)

## 2013-03-27 LAB — CBC WITH DIFFERENTIAL/PLATELET
Basophils Absolute: 0 10*3/uL (ref 0.0–0.1)
HCT: 36.8 % — ABNORMAL LOW (ref 39.0–52.0)
Lymphocytes Relative: 11 % — ABNORMAL LOW (ref 12–46)
Neutro Abs: 5.7 10*3/uL (ref 1.7–7.7)
Neutrophils Relative %: 75 % (ref 43–77)
Platelets: 189 10*3/uL (ref 150–400)
RDW: 15.1 % (ref 11.5–15.5)
WBC: 7.6 10*3/uL (ref 4.0–10.5)

## 2013-03-27 LAB — BASIC METABOLIC PANEL
CO2: 29 mEq/L (ref 19–32)
Chloride: 92 mEq/L — ABNORMAL LOW (ref 96–112)
Potassium: 4.3 mEq/L (ref 3.5–5.1)
Sodium: 131 mEq/L — ABNORMAL LOW (ref 135–145)

## 2013-03-27 LAB — TSH: TSH: 0.684 u[IU]/mL (ref 0.350–4.500)

## 2013-03-27 LAB — GLUCOSE, CAPILLARY
Glucose-Capillary: 90 mg/dL (ref 70–99)
Glucose-Capillary: 130 mg/dL — ABNORMAL HIGH (ref 70–99)
Glucose-Capillary: 121 mg/dL — ABNORMAL HIGH (ref 70–99)
Glucose-Capillary: 85 mg/dL (ref 70–99)

## 2013-03-27 MED ORDER — NITROGLYCERIN 0.4 MG SL SUBL: 0.4000 mg | SUBLINGUAL_TABLET | SUBLINGUAL | Status: DC | PRN

## 2013-03-27 MED ORDER — SODIUM CHLORIDE 0.9 % IJ SOLN
3.0000 mL | Freq: Two times a day (BID) | INTRAMUSCULAR | Status: DC
  Administered 2013-03-27 (×2): 3 mL via INTRAVENOUS
  Administered 2013-03-28: 21:00:00 via INTRAVENOUS

## 2013-03-27 MED ORDER — DOCUSATE SODIUM 100 MG PO TABS: 100.0000 mg | ORAL_TABLET | Freq: Every day | ORAL | Status: DC | PRN

## 2013-03-27 MED ORDER — ONDANSETRON HCL 4 MG/2ML IJ SOLN: 4.0000 mg | Freq: Four times a day (QID) | INTRAMUSCULAR | Status: DC | PRN

## 2013-03-27 MED ORDER — INSULIN NPH (HUMAN) (ISOPHANE) 100 UNIT/ML ~~LOC~~ SUSP
70.0000 [IU] | Freq: Every day | SUBCUTANEOUS | Status: DC
  Administered 2013-03-27 – 2013-03-29 (×3): 70 [IU] via SUBCUTANEOUS
  Filled 2013-03-27: qty 10

## 2013-03-27 MED ORDER — ONDANSETRON HCL 4 MG PO TABS: 4.0000 mg | ORAL_TABLET | Freq: Four times a day (QID) | ORAL | Status: DC | PRN

## 2013-03-27 MED ORDER — SPIRONOLACTONE 12.5 MG HALF TABLET
12.5000 mg | ORAL_TABLET | Freq: Every day | ORAL | Status: DC
  Administered 2013-03-27 – 2013-03-29 (×3): 12.5 mg via ORAL
  Filled 2013-03-27 (×3): qty 1

## 2013-03-27 MED ORDER — BUDESONIDE 0.25 MG/2ML IN SUSP
0.2500 mg | Freq: Two times a day (BID) | RESPIRATORY_TRACT | Status: DC
  Administered 2013-03-27 – 2013-03-28 (×3): 0.25 mg via RESPIRATORY_TRACT
  Filled 2013-03-27 (×5): qty 2

## 2013-03-27 MED ORDER — ALBUTEROL SULFATE (5 MG/ML) 0.5% IN NEBU: 2.5000 mg | INHALATION_SOLUTION | RESPIRATORY_TRACT | Status: DC | PRN

## 2013-03-27 MED ORDER — ASPIRIN EC 81 MG PO TBEC
81.0000 mg | DELAYED_RELEASE_TABLET | Freq: Every day | ORAL | Status: DC
  Administered 2013-03-27 – 2013-03-28 (×2): 81 mg via ORAL
  Filled 2013-03-27 (×4): qty 1

## 2013-03-27 MED ORDER — GABAPENTIN 600 MG PO TABS
600.0000 mg | ORAL_TABLET | Freq: Three times a day (TID) | ORAL | Status: DC
  Administered 2013-03-27 – 2013-03-29 (×7): 600 mg via ORAL
  Filled 2013-03-27 (×10): qty 1

## 2013-03-27 MED ORDER — VANCOMYCIN HCL 10 G IV SOLR
1500.0000 mg | INTRAVENOUS | Status: AC
  Administered 2013-03-27: 1500 mg via INTRAVENOUS
  Filled 2013-03-27: qty 1500

## 2013-03-27 MED ORDER — DOCUSATE SODIUM 100 MG PO CAPS: 100.0000 mg | ORAL_CAPSULE | Freq: Every day | ORAL | Status: DC | PRN

## 2013-03-27 MED ORDER — IPRATROPIUM BROMIDE 0.02 % IN SOLN
500.0000 ug | Freq: Two times a day (BID) | RESPIRATORY_TRACT | Status: DC
  Administered 2013-03-27 – 2013-03-28 (×3): 500 ug via RESPIRATORY_TRACT
  Filled 2013-03-27 (×4): qty 2.5

## 2013-03-27 MED ORDER — PANTOPRAZOLE SODIUM 40 MG PO TBEC
40.0000 mg | DELAYED_RELEASE_TABLET | Freq: Every day | ORAL | Status: DC
  Administered 2013-03-27 – 2013-03-29 (×3): 40 mg via ORAL
  Filled 2013-03-27 (×3): qty 1

## 2013-03-27 MED ORDER — ASPIRIN 81 MG PO TABS: 81.0000 mg | ORAL_TABLET | Freq: Every day | ORAL | Status: DC

## 2013-03-27 MED ORDER — ATORVASTATIN CALCIUM 40 MG PO TABS
40.0000 mg | ORAL_TABLET | Freq: Every evening | ORAL | Status: DC
  Administered 2013-03-27 – 2013-03-28 (×2): 40 mg via ORAL
  Filled 2013-03-27 (×3): qty 1

## 2013-03-27 MED ORDER — LOSARTAN POTASSIUM 50 MG PO TABS
50.0000 mg | ORAL_TABLET | Freq: Every morning | ORAL | Status: DC
  Administered 2013-03-27 – 2013-03-29 (×3): 50 mg via ORAL
  Filled 2013-03-27 (×4): qty 1

## 2013-03-27 MED ORDER — TAMSULOSIN HCL 0.4 MG PO CAPS
0.4000 mg | ORAL_CAPSULE | Freq: Every day | ORAL | Status: DC
  Administered 2013-03-27 – 2013-03-28 (×2): 0.4 mg via ORAL
  Filled 2013-03-27 (×3): qty 1

## 2013-03-27 MED ORDER — TORSEMIDE 20 MG PO TABS
60.0000 mg | ORAL_TABLET | Freq: Two times a day (BID) | ORAL | Status: DC
  Administered 2013-03-27 – 2013-03-29 (×3): 60 mg via ORAL
  Filled 2013-03-27 (×7): qty 3

## 2013-03-27 MED ORDER — ACETAMINOPHEN 650 MG RE SUPP: 650.0000 mg | Freq: Four times a day (QID) | RECTAL | Status: DC | PRN

## 2013-03-27 MED ORDER — MORPHINE SULFATE 2 MG/ML IJ SOLN
1.0000 mg | INTRAMUSCULAR | Status: DC | PRN
  Administered 2013-03-27 – 2013-03-28 (×4): 1 mg via INTRAVENOUS
  Filled 2013-03-27 (×6): qty 1

## 2013-03-27 MED ORDER — ISOSORBIDE MONONITRATE ER 30 MG PO TB24
30.0000 mg | ORAL_TABLET | Freq: Every day | ORAL | Status: DC
  Administered 2013-03-27 – 2013-03-29 (×3): 30 mg via ORAL
  Filled 2013-03-27 (×3): qty 1

## 2013-03-27 MED ORDER — SODIUM CHLORIDE 0.9 % IJ SOLN
3.0000 mL | Freq: Two times a day (BID) | INTRAMUSCULAR | Status: DC
  Administered 2013-03-27 (×2): 3 mL via INTRAVENOUS

## 2013-03-27 MED ORDER — INSULIN ASPART 100 UNIT/ML ~~LOC~~ SOLN
0.0000 [IU] | Freq: Three times a day (TID) | SUBCUTANEOUS | Status: DC
  Administered 2013-03-27 – 2013-03-28 (×3): 1 [IU] via SUBCUTANEOUS
  Administered 2013-03-28: 18:00:00 via SUBCUTANEOUS
  Administered 2013-03-28: 2 [IU] via SUBCUTANEOUS
  Administered 2013-03-29: 1 [IU] via SUBCUTANEOUS

## 2013-03-27 MED ORDER — ACETAMINOPHEN 325 MG PO TABS
650.0000 mg | ORAL_TABLET | Freq: Four times a day (QID) | ORAL | Status: DC | PRN
  Administered 2013-03-27: 650 mg via ORAL
  Filled 2013-03-27: qty 2

## 2013-03-27 MED ORDER — ALBUTEROL SULFATE HFA 108 (90 BASE) MCG/ACT IN AERS: 2.0000 | INHALATION_SPRAY | Freq: Four times a day (QID) | RESPIRATORY_TRACT | Status: DC | PRN

## 2013-03-27 MED ORDER — CARVEDILOL 6.25 MG PO TABS
6.2500 mg | ORAL_TABLET | Freq: Two times a day (BID) | ORAL | Status: DC
  Administered 2013-03-27 – 2013-03-29 (×3): 6.25 mg via ORAL
  Filled 2013-03-27 (×7): qty 1

## 2013-03-27 MED ORDER — MORPHINE SULFATE 2 MG/ML IJ SOLN
2.0000 mg | Freq: Once | INTRAMUSCULAR | Status: AC
  Administered 2013-03-27: 2 mg via INTRAVENOUS
  Filled 2013-03-27: qty 1

## 2013-03-27 MED ORDER — HEPARIN SODIUM (PORCINE) 5000 UNIT/ML IJ SOLN
5000.0000 [IU] | Freq: Three times a day (TID) | INTRAMUSCULAR | Status: DC
  Administered 2013-03-27 – 2013-03-29 (×7): 5000 [IU] via SUBCUTANEOUS
  Filled 2013-03-27 (×10): qty 1

## 2013-03-27 MED ORDER — HYDROCODONE-ACETAMINOPHEN 10-325 MG PO TABS
1.0000 | ORAL_TABLET | Freq: Four times a day (QID) | ORAL | Status: DC | PRN
  Administered 2013-03-27 – 2013-03-29 (×6): 1 via ORAL
  Filled 2013-03-27 (×6): qty 1

## 2013-03-27 MED ORDER — POTASSIUM CHLORIDE CRYS ER 20 MEQ PO TBCR
20.0000 meq | EXTENDED_RELEASE_TABLET | Freq: Two times a day (BID) | ORAL | Status: DC
  Administered 2013-03-27 – 2013-03-29 (×5): 20 meq via ORAL
  Filled 2013-03-27 (×7): qty 1

## 2013-03-27 MED ORDER — CLOPIDOGREL BISULFATE 75 MG PO TABS
75.0000 mg | ORAL_TABLET | Freq: Every day | ORAL | Status: DC
  Administered 2013-03-27 – 2013-03-29 (×3): 75 mg via ORAL
  Filled 2013-03-27 (×4): qty 1

## 2013-03-27 NOTE — ED Notes (Addendum)
This nurse spoke with Dr. Toniann Fail regarding pts low diastolic BP and he stated that as long as his systolic BP is above 100 the patient is okay. Pts BP 116/48.

## 2013-03-27 NOTE — Evaluation (Signed)
Physical Therapy Evaluation Patient Details Name: Mark French MRN: [id number] DOB: 08/28/1945 Today's Date: 03/27/2013 Time: 1530-1601 PT Time Calculation (min): 31 min  PT Assessment / Plan / Recommendation History of Present Illness  Mark French is a 67 y.o. male history of CAD status post CABG, COPD, chronic kidney disease, chronic systolic heart failure, legally blind, diabetes mellitus had a fall yesterday at his house. Patient states that he does not recall the incident exactly but lost his consciousness while standing and fell. He states he may have lost consciousness for a few seconds. He also hit his head and left side of the chest. Patient has been having frequent falls and had come to the ER for similar reason. Patient at this time feels that he may in addition need rehabilitation placement. Patient presently still has some chest pain of the left side over the ribs which is reproducible on palpation. Patient otherwise denies any shortness of breath or any focal deficits.  Clinical Impression  Pt admitted after fall, likely syncopal in nature with rib pain. Pt currently with functional limitations due to the deficits listed below (see PT Problem List).  Pt will benefit from skilled PT to increase their independence and safety with mobility to allow discharge to the venue listed below.       PT Assessment  Patient needs continued PT services    Follow Up Recommendations  Home health PT    Does the patient have the potential to tolerate intense rehabilitation      Barriers to Discharge        Equipment Recommendations  None recommended by PT    Recommendations for Other Services     Frequency Min 3X/week    Precautions / Restrictions Precautions Precautions: Fall Restrictions Weight Bearing Restrictions: No   Pertinent Vitals/Pain       Mobility  Bed Mobility Bed Mobility: Not assessed (pt sleeps in a recliner) Transfers Transfers: Sit to Stand;Stand to  Sit Sit to Stand: 4: Min guard;With upper extremity assist;From chair/3-in-1 Stand to Sit: 4: Min guard;With upper extremity assist;To chair/3-in-1 Details for Transfer Assistance: safe and uses safe technique Ambulation/Gait Ambulation/Gait Assistance: 4: Min guard Ambulation Distance (Feet): 40 Feet Assistive device: Rolling walker Ambulation/Gait Assistance Details: guarded gait, but generally steady.  pt unable to walk far without fatigue Gait Pattern: Step-through pattern Stairs: No Wheelchair Mobility Wheelchair Mobility: No    Exercises     PT Diagnosis: Difficulty walking;Generalized weakness;Acute pain  PT Problem List: Decreased strength;Decreased balance;Decreased coordination;Decreased mobility;Decreased knowledge of use of DME PT Treatment Interventions: DME instruction;Gait training;Functional mobility training;Patient/family education;Therapeutic activities     PT Goals(Current goals can be found in the care plan section) Acute Rehab PT Goals Patient Stated Goal: Get back to more normal activity level PT Goal Formulation: With patient Time For Goal Achievement: 04/03/13 Potential to Achieve Goals: Good  Visit Information  Last PT Received On: 03/27/13 Assistance Needed: +1 History of Present Illness: Mark French is a 67 y.o. male history of CAD status post CABG, COPD, chronic kidney disease, chronic systolic heart failure, legally blind, diabetes mellitus had a fall yesterday at his house. Patient states that he does not recall the incident exactly but lost his consciousness while standing and fell. He states he may have lost consciousness for a few seconds. He also hit his head and left side of the chest. Patient has been having frequent falls and had come to the ER for similar reason. Patient at this time feels  that he may in addition need rehabilitation placement. Patient presently still has some chest pain of the left side over the ribs which is reproducible on  palpation. Patient otherwise denies any shortness of breath or any focal deficits.       Prior Functioning  Home Living Family/patient expects to be discharged to:: Private residence (feels better about going home today) Living Arrangements: Spouse/significant other Available Help at Discharge: Family;Available 24 hours/day Type of Home: House Home Access: Ramped entrance Home Layout: One level Home Equipment: Walker - 2 wheels;Cane - quad;Cane - single point;Grab bars - toilet;Grab bars - tub/shower;Hand held shower head;Wheelchair - power Prior Function Level of Independence: Independent Communication Communication: No difficulties Dominant Hand: Right    Cognition  Cognition Arousal/Alertness: Awake/alert Behavior During Therapy: WFL for tasks assessed/performed Overall Cognitive Status: Within Functional Limits for tasks assessed    Extremity/Trunk Assessment Upper Extremity Assessment Upper Extremity Assessment: Overall WFL for tasks assessed Lower Extremity Assessment Lower Extremity Assessment: Overall WFL for tasks assessed;Generalized weakness (painful L LE and weaker R LE, both functional)   Balance    End of Session PT - End of Session Activity Tolerance: Patient tolerated treatment well Patient left: in chair;with call bell/phone within reach;with chair alarm set Nurse Communication: Mobility status  GP     Mark French, Mark French 03/27/2013, 4:17 PM 03/27/2013  Mark French, PT [phone]  (pager)

## 2013-03-27 NOTE — Consult Note (Signed)
PHARMACY CONSULT NOTE - INITIAL  Pharmacy Consult for :   Vancomycin Indication:  L-leg cellulitis  Hospital Problems: Active Problems:   Chronic systolic heart failure   RENAL INSUFFICIENCY   DM2 (diabetes mellitus, type 2)   Chronic kidney disease, stage III (moderate)   Chest pain   Syncope   Rib pain on left side   Left leg cellulitis  Allergies: Allergies  Allergen Reactions  . Sildenafil Nausea Only and Other (See Comments)    "took it once; heart went to pieces; never took it again"  . Percocet [Oxycodone-Acetaminophen] Other (See Comments)    Unknown reaction-possible altered mental state-per wife.    . Contrast Media [Iodinated Diagnostic Agents] Nausea Only    Patient Measurements: Height: 5\' 2"  (157.5 cm) Weight: 227 lb 4.7 oz (103.1 kg) IBW/kg (Calculated) : 54.6 kg  Vital Signs: BP 102/46  Pulse 76  Temp(Src) 98.4 F (36.9 C) (Oral)  Resp 19  Ht 5\' 2"  (1.575 m)  Wt 227 lb 4.7 oz (103.1 kg)  BMI 41.56 kg/m2  SpO2 100%  Labs:  Recent Labs  03/26/13 0820 03/26/13 2227 03/27/13 0721  WBC 7.7 7.6 8.0  HGB 12.5* 12.4* 13.0  PLT 127* 189 188  CREATININE  --  1.72* 1.57*   Estimated Creatinine Clearance: 46.5 ml/min (by C-G formula based on Cr of 1.57).   Microbiology: No results found for this or any previous visit (from the past 720 hour(s)).  Medical/Surgical History: Past Medical History  Diagnosis Date  . CORONARY ARTERY DISEASE 03/11/2007    s/p CABG 1998;  admitted 08/2011 with an inferior STEMI.  LHC 09/07/11: Severe  Three-vessel CAD, LIMA-LAD patent, SVG-RCA chronically occluded, SVG-circumflex chronically occluded, SVG-diagonal 95%.  PCI: Promus DES to the SVG-diagonal.  Procedure c/b transient CHB req CPR and TTVP;  myoview 4/13:large severe inferolateral infarct, no ischemia, EF 25%    . DIABETES MELLITUS, TYPE I 03/11/2007  . HYPERTENSION 03/11/2007  . 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 HYPERCHOLESTEROLEMIA 09/28/2008   . CERVICAL RADICULOPATHY, LEFT 09/23/2008  . PERIPHERAL NEUROPATHY 09/23/2008  . Chronic systolic heart failure 09/23/2008    Echocardiogram 09/07/11: Difficult study, inferior and posterior lateral wall hypokinesis, mild LVH, EF 45%, mild LAE.   [name] RENAL INSUFFICIENCY 03/15/2010  . ED (erectile dysfunction)   . Hypogonadism male   . GI bleed 2012    Multiple Duodenal ulcer   . Obesity   . Legally blind     bilaterally  . Stroke 71    age 61 months; residual right sided weakness  . History of blood transfusion 2012  . Charcot foot due to diabetes mellitus     chronic pain  . Osteoarthritis     right hip and shoulder  . COPD (chronic obstructive pulmonary disease)   . Myocardial infarction 2013b Jan  . PERIPHERAL VASCULAR INSUFFICIENCY,  LEGS, BILATERAL 08/17/2010    ABI's performed 04/19/11 R 0.72 L 0.71  . ANEMIA-IRON DEFICIENCY 09/23/2008    ulcer related last year  . Chronic edema     Chronic pedal edema   Past Surgical History  Procedure Laterality Date  . Carpal tunnel release      left  . Sigmoidoscopy  03/11/2001  . Venous doppler  01/30/2004  . Tonsillectomy and adenoidectomy      "as a kid"  . Cataract extraction w/ intraocular lens  implant, bilateral    . Coronary angioplasty with stent placement  08/2011    "1"  . Retinopathy surgery  2000's    "  laser; both eyes"  . Wrist fusion  1976    right  . Anterior fusion cervical spine  2010    "C spine; Dr. Yetta Barre"  . Ankle fracture surgery  1976; ?date    LEFT:  fused; removed bulk of hardware  . Heel spur surgery  ~ 2007    ? left  . Irrigation and debridement knee  02/27/2012    Procedure: IRRIGATION AND DEBRIDEMENT KNEE;  Surgeon: Verlee Rossetti, MD;  Location: [facility] OR;  Service: Orthopedics;  Laterality: Right;  irrigation and drainage right knee septic bursitis  . Eye surgery  2008    cataract removal, cautery  . Coronary artery bypass graft  1998    CABG X5  . Fracture surgery      foot broken 1976     Medications:  Prescriptions prior to admission  Medication Sig Dispense Refill  . albuterol (PROVENTIL HFA;VENTOLIN HFA) 108 (90 BASE) MCG/ACT inhaler Inhale 2 puffs into the lungs every 6 (six) hours as needed for wheezing or shortness of breath.       [name] albuterol (PROVENTIL) (2.5 MG/3ML) 0.083% nebulizer solution Take 2.5 mg by nebulization every 6 (six) hours as needed for wheezing or shortness of breath.       [name] aspirin 81 MG tablet Take 81 mg by mouth at bedtime.       [name] atorvastatin (LIPITOR) 40 MG tablet Take 40 mg by mouth every evening.      . budesonide (PULMICORT) 0.25 MG/2ML nebulizer solution Take 2 mLs (0.25 mg total) by nebulization 2 (two) times daily. Dx 496  120 mL  12  . carvedilol (COREG) 6.25 MG tablet Take 1 tablet (6.25 mg total) by mouth 2 (two) times daily with a meal.  60 tablet  6  . clopidogrel (PLAVIX) 75 MG tablet Take 1 tablet (75 mg total) by mouth daily with breakfast.  90 tablet  3  . co-enzyme Q-10 30 MG capsule Take 30 mg by mouth daily.      Tery Sanfilippo Sodium (STOOL SOFTENER) 100 MG capsule Take 100 mg by mouth daily as needed for constipation.       . gabapentin (NEURONTIN) 600 MG tablet Take 1 tablet (600 mg total) by mouth 3 (three) times daily.  90 tablet  4  . HYDROcodone-acetaminophen (NORCO) 10-325 MG per tablet Take 1 tablet by mouth every 6 (six) hours as needed for pain.  100 tablet  3  . insulin NPH (HUMULIN N,NOVOLIN N) 100 UNIT/ML injection Inject 70 Units into the skin daily before breakfast.      . ipratropium (ATROVENT) 0.02 % nebulizer solution Take 500 mcg by nebulization 2 (two) times daily.      . isosorbide mononitrate (IMDUR) 30 MG 24 hr tablet Take 1 tablet (30 mg total) by mouth daily.  30 tablet  6  . losartan (COZAAR) 50 MG tablet Take 50 mg by mouth every morning.       . nitroGLYCERIN (NITROSTAT) 0.4 MG SL tablet Place 1 tablet (0.4 mg total) under the tongue every 5 (five) minutes as needed for chest pain.  25 tablet  5  .  pantoprazole (PROTONIX) 40 MG tablet Take 40 mg by mouth daily.       . potassium chloride SA (K-DUR,KLOR-CON) 20 MEQ tablet Take 1 tablet (20 mEq total) by mouth 2 (two) times daily.  60 tablet  6  . silodosin (RAPAFLO) 8 MG CAPS capsule Take 8 mg by mouth every evening.       [name]  spironolactone (ALDACTONE) 25 MG tablet Take 0.5 tablets (12.5 mg total) by mouth daily.  30 tablet  6  . torsemide (DEMADEX) 20 MG tablet Take 60 mg by mouth 2 (two) times daily.       Scheduled:  . aspirin EC  81 mg Oral QHS  . atorvastatin  40 mg Oral QPM  . budesonide  0.25 mg Nebulization BID  . carvedilol  6.25 mg Oral BID WC  . clopidogrel  75 mg Oral Q breakfast  . gabapentin  600 mg Oral TID  . heparin  5,000 Units Subcutaneous Q8H  . insulin aspart  0-9 Units Subcutaneous TID WC  . insulin NPH  70 Units Subcutaneous QAC breakfast  . ipratropium  500 mcg Nebulization BID  . isosorbide mononitrate  30 mg Oral Daily  . losartan  50 mg Oral q morning - 10a  . pantoprazole  40 mg Oral Daily  . potassium chloride SA  20 mEq Oral BID  . sodium chloride  3 mL Intravenous Q12H  . sodium chloride  3 mL Intravenous Q12H  . spironolactone  12.5 mg Oral Daily  . tamsulosin  0.4 mg Oral QPC supper  . torsemide  60 mg Oral BID   Anti-infectives   None     Assessment:  67 year old male with history of CAD, CABG, COPD, chronic kidney disease, chronic systolic CHF, legally blind, DM with peripheral neuropathy, HTN was admitted with cellulitis of left leg following fall while on scooter with the scooter landing on his leg.  History of syncope, cardiac disease.  Patient will be evaluated while in hospital.  Vancomycin to be started for cellulitis coverage.  Goal of Therapy:   Vancomycin trough level 10-15 mcg/ml Antibiotics selected for infection/cultures and adjusted for renal function.   Plan:   Begin Vancomycin 1500 mg IV q 24 hours. Follow up SCr, UOP, cultures, clinical course and adjust as  clinically indicated.  Gianni Mihalik, Elisha Headland,  Pharm.D.,    8/21/20144:30 PM

## 2013-03-27 NOTE — ED Notes (Signed)
The pt has had pain med re-positioned in the bed

## 2013-03-27 NOTE — ED Provider Notes (Signed)
Care assumed from Dr Jodi Mourning.  Pt here for fall prior to arrival.  Pt has had multiple visits over the last few days for falls, weakness, near syncope.  Workup here unremarkable.  Plan was if patient was able to ambulate, pt was to go home to discuss with pcp rhab stay.  Pt was able to stand and walk, but was slightly unsteady.  Pt and wife were firm with going home.  Upon d/c, however, pt c/o severe pain in left rib area where he fell today.  Will contact hospitalist for admission for falls, deconditioning, weakness  Olivia Mackie, MD 03/27/13 [phone]

## 2013-03-27 NOTE — Progress Notes (Signed)
[facility] PROGRESS NOTE  Mark French ZOX:[id number] DOB: 1945-08-29 DOA: 03/26/2013 PCP: [name], MD Primary Cardiologist: Dr. Tonny Bollman.  Brief narrative 67 year old male with history of CAD, CABG, COPD, chronic kidney disease, chronic systolic CHF, legally blind, DM with peripheral neuropathy, HTN was admitted on 03/27/13 following the fall and? Syncope at home last night. Patient lives with spouse. Ambulates with the help of a cane and moves around with the help of an electric scooter. 6 days prior to admission, he fell while reversing his scooter from the [employer], fell to the ground and the scooter landed on left leg. He denies loss of consciousness. Since then left leg has been hurting. Last night, patient tried to get up from a reclining chair to go to his scooter, ambulate in a couple of steps and the next thing he remembers falling on the floor. He can't remember whether he lost consciousness or not. He heard left side of his chest which is painful and left little finger.  Assessment/Plan: 1. Syncope/near syncope: Unclear etiology. No orthostatic changes at noon today. Echo 10/16/12: LVEF 45-50% and severe hypokinesis of inferior myocardium. Continue monitoring on telemetry. Carotid Dopplers 08/24/11: No significant right ICA stenosis. Left 14 at 59% ICA stenosis. We'll repeat carotid Dopplers. PT and OT evaluation. CT head without acute findings. 2. Left-sided chest pain/left anterior fifth rib fracture: Reproducible suggesting musculoskeletal etiology from fall and trauma. Chest x-ray shows minimally displaced left anterior fifth rib fracture. Pain management 3. Possible left leg cellulitis: X-rays on 03/26/13 without acute findings. Patient has small open wounds. Treat with IV vancomycin and follow clinically. Elevate left leg. 4. Chronic systolic CHF: Compensated. Continue diuretics. 5. Type II DM with peripheral neuropathy: Reasonable inpatient control. 6. Stage III chronic  kidney disease: Creatinine at baseline. 7. Multiple falls: Possibly multifactorial from peripheral neuropathy, lower extremity pain from recent fall related injury, legal blindness and generally poor health. PT and OT evaluation.  8. CAD/CABG: Asymptomatic and stable 9. Hypertension: Controlled.  Code Status: Full  Family Communication: Discussed with spouse at bedside.  Disposition Plan: To be determined   Consultants:  None  Procedures:  None  Antibiotics:  IV vancomycin 8/21 >   HPI/Subjective: Left leg pain-states that appears more red than usual. Left little finger pain following fall yesterday. Left-sided chest pain worse with deep breathing or touching.   Objective: Filed Vitals:   03/27/13 1229 03/27/13 1230 03/27/13 1231 03/27/13 1410  BP: 114/40 107/42 106/43 102/46  Pulse: 67 71 78 76  Temp:    98.4 F (36.9 C)  TempSrc:    Oral  Resp:      Height:      Weight:      SpO2: 99% 97% 100% 100%   No intake or output data in the 24 hours ending 03/27/13 1528 Filed Weights   03/27/13 0605  Weight: 103.1 kg (227 lb 4.7 oz)    Exam:   General exam:  moderately built and obese male patient, chronically ill-looking, sitting on chair and intermittent painful distress.   Respiratory system: Clear. No increased work of breathing.Report is left anterior chest wall pain without external bruising.   Cardiovascular system: S1 & S2 heard, RRR. No JVD, murmurs, gallops, clicks. 1+ bilateral leg edema left >right. Patient has superficial poor 3 wounds on left shin. Darkish discoloration below midshin bilaterally but left side has increased warmth and some redness. Also tender to touch. Left fifth finger mildly swollen and tender.   Gastrointestinal system: Abdomen  is nondistended, soft and nontender. Normal bowel sounds heard.  Central nervous system: Alert and oriented. No focal neurological deficits.Decreased visual acuity.   Extremities: Symmetric 5 x 5  power.   Data Reviewed: Basic Metabolic Panel:  Recent Labs Lab 03/21/13 1018 03/23/13 1722 03/26/13 2227 03/27/13 0721  NA 129* 130* 131* 131*  K 4.2 3.6 4.3 4.3  CL 93* 91* 92* 93*  CO2 32 30 29 25   GLUCOSE 105* 92 100* 86  BUN 25* 34* 38* 36*  CREATININE 1.5 1.62* 1.72* 1.57*  CALCIUM 8.7 9.1 9.1 9.4   Liver Function Tests:  Recent Labs Lab 03/23/13 1722 03/27/13 0721  AST 22 20  ALT 19 16  ALKPHOS 83 77  BILITOT 0.4 0.5  PROT 6.9 7.3  ALBUMIN 3.4* 3.4*    Recent Labs Lab 03/23/13 1722  LIPASE 29   No results found for this basename: AMMONIA,  in the last 168 hours CBC:  Recent Labs Lab 03/23/13 1722 03/26/13 0820 03/26/13 2227 03/27/13 0721  WBC 7.3 7.7 7.6 8.0  NEUTROABS  --   --  5.7  --   HGB 12.5* 12.5* 12.4* 13.0  HCT 37.1* 36.9* 36.8* 37.5*  MCV 85.1 84.4 85.6 84.8  PLT 185 127* 189 188   Cardiac Enzymes:  Recent Labs Lab 03/26/13 0820 03/27/13 0721  TROPONINI <0.30 <0.30   BNP (last 3 results)  Recent Labs  04/26/12 1944 08/02/12 1114 03/01/13 1940  PROBNP 788.0* 248.0* 417.4*   CBG:  Recent Labs Lab 03/26/13 0802 03/27/13 0645 03/27/13 1150  GLUCAP 120* 90 130*    No results found for this or any previous visit (from the past 240 hour(s)).   Studies: Dg Chest 2 View  03/27/2013   *RADIOLOGY REPORT*  Clinical Data: Mid to lower thoracic spine pain and chest pain secondary to a fall.  CHEST - 2 VIEW  Comparison: 03/01/2013  Findings: Heart size and pulmonary vascularity are normal.  Prior CABG.  Minimal scarring in the lingula.  Lungs are otherwise clear. No effusions. The thoracic spine appears normal.  IMPRESSION: No acute abnormality.   Original Report Authenticated By: Francene Boyers, M.D.   Dg Ribs Unilateral Left  03/27/2013   *RADIOLOGY REPORT*  Clinical Data: Anterior left chest and rib pain post fall last night  LEFT RIBS - 2 VIEW  Comparison: Chest radiograph 03/26/2013  Findings: Bones appear mildly  demineralized. Fracture identified at anterior left fifth rib, minimally displaced. No other fractures identified.  IMPRESSION: Minimally displaced anterior left fifth rib fracture.   Original Report Authenticated By: Ulyses Southward, M.D.   Dg Thoracic Spine W/swimmers  03/27/2013   *RADIOLOGY REPORT*  Clinical Data: Mid to lower thoracic spine pain secondary to a fall.  THORACIC SPINE - 2 VIEW + SWIMMERS  Comparison: None.  Findings: There is no fracture or bone destruction or subluxation. There are slight degenerative changes in the mid to lower thoracic spine.  IMPRESSION: No acute abnormality of the thoracic spine.   Original Report Authenticated By: Francene Boyers, M.D.   Dg Tibia/fibula Left  03/26/2013   *RADIOLOGY REPORT*  Clinical Data: Recent traumatic injury with pain  LEFT TIBIA AND FIBULA - 2 VIEW  Comparison: 04/26/2012  Findings: Postsurgical changes are again noted in the left foot. No acute fracture or dislocation is noted.  No soft tissue abnormality is seen.  IMPRESSION: No acute abnormality noted.   Original Report Authenticated By: Alcide Clever, M.D.   Ct Head Wo Contrast  03/27/2013   *  RADIOLOGY REPORT*  Clinical Data: Trauma secondary to a fall.  CT HEAD WITHOUT CONTRAST  Technique:  Contiguous axial images were obtained from the base of the skull through the vertex without contrast.  Comparison: 08/29/2011  Findings: There is no acute intracranial hemorrhage, infarction, or mass lesion.  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 There is abnormal lucency in the pons consistent with a chronic ischemic change.  No acute osseous abnormality.  IMPRESSION: No acute abnormality.  Old left parietal infarct.  Old ischemic changes in the pons, stable.   Original Report Authenticated By: Francene Boyers, M.D.     Additional labs:   Scheduled Meds: . aspirin EC  81 mg Oral QHS  . atorvastatin  40 mg Oral QPM  . budesonide  0.25 mg Nebulization BID  . carvedilol   6.25 mg Oral BID WC  . clopidogrel  75 mg Oral Q breakfast  . gabapentin  600 mg Oral TID  . heparin  5,000 Units Subcutaneous Q8H  . insulin aspart  0-9 Units Subcutaneous TID WC  . insulin NPH  70 Units Subcutaneous QAC breakfast  . ipratropium  500 mcg Nebulization BID  . isosorbide mononitrate  30 mg Oral Daily  . losartan  50 mg Oral q morning - 10a  . pantoprazole  40 mg Oral Daily  . potassium chloride SA  20 mEq Oral BID  . sodium chloride  3 mL Intravenous Q12H  . sodium chloride  3 mL Intravenous Q12H  . spironolactone  12.5 mg Oral Daily  . tamsulosin  0.4 mg Oral QPC supper  . torsemide  60 mg Oral BID   Continuous Infusions:   Active Problems:   Chronic systolic heart failure   RENAL INSUFFICIENCY   DM2 (diabetes mellitus, type 2)   Chronic kidney disease, stage III (moderate)   Chest pain   Syncope   Rib pain on left side    Time spent: 45 minutes.    [facility]  [facility] Pager [phone].   If 8PM-8AM, please contact night-coverage at [email or web address], password [facility] 03/27/2013, 3:28 PM  LOS: 1 day

## 2013-03-27 NOTE — H&P (Signed)
[facility] History and Physical  Mark French HYQ:[id number] DOB: 1946-01-15 DOA: 03/26/2013  Referring physician: ER physician. PCP: Mark Belling, MD  Specialists: Mark French. Cardiologist.  Chief Complaint: Loss of consciousness.  HPI: Mark French is a 67 y.o. male history of CAD status post CABG, COPD, chronic kidney disease, chronic systolic heart failure, legally blind, diabetes mellitus had a fall yesterday at his house. Patient states that he does not recall the incident exactly but lost his consciousness while standing and fell. He states he may have lost consciousness for a few seconds. He also hit his head and left side of the chest. Patient has been having frequent falls and had come to the ER for similar reason. Patient at this time feels that he may in addition need rehabilitation placement. Patient presently still has some chest pain of the left side over the ribs which is reproducible on palpation. Patient otherwise denies any shortness of breath or any focal deficits.   Review of Systems: As presented in the history of presenting illness, rest negative.  Past Medical History  Diagnosis Date  . CORONARY ARTERY DISEASE 03/11/2007    s/p CABG 1998;  admitted 08/2011 with an inferior STEMI.  LHC 09/07/11: Severe  Three-vessel CAD, LIMA-LAD patent, SVG-RCA chronically occluded, SVG-circumflex chronically occluded, SVG-diagonal 95%.  PCI: Promus DES to the SVG-diagonal.  Procedure c/b transient CHB req CPR and TTVP;  myoview 4/13:large severe inferolateral infarct, no ischemia, EF 25%    . DIABETES MELLITUS, TYPE I 03/11/2007  . HYPERTENSION 03/11/2007  . 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 HYPERCHOLESTEROLEMIA 09/28/2008  . CERVICAL RADICULOPATHY, LEFT 09/23/2008  . PERIPHERAL NEUROPATHY 09/23/2008  . Chronic systolic heart failure 09/23/2008    Echocardiogram 09/07/11: Difficult study, inferior and posterior lateral wall hypokinesis, mild LVH, EF 45%, mild  LAE.   [name] RENAL INSUFFICIENCY 03/15/2010  . ED (erectile dysfunction)   . Hypogonadism male   . GI bleed 2012    Multiple Duodenal ulcer   . Obesity   . Legally blind     bilaterally  . Stroke 56    age 4 months; residual right sided weakness  . History of blood transfusion 2012  . Charcot foot due to diabetes mellitus     chronic pain  . Osteoarthritis     right hip and shoulder  . COPD (chronic obstructive pulmonary disease)   . Myocardial infarction 2013b Jan  . PERIPHERAL VASCULAR INSUFFICIENCY,  LEGS, BILATERAL 08/17/2010    ABI's performed 04/19/11 R 0.72 L 0.71  . ANEMIA-IRON DEFICIENCY 09/23/2008    ulcer related last year  . Chronic edema     Chronic pedal edema   Past Surgical History  Procedure Laterality Date  . Carpal tunnel release      left  . Sigmoidoscopy  03/11/2001  . Venous doppler  01/30/2004  . Tonsillectomy and adenoidectomy      "as a kid"  . Cataract extraction w/ intraocular lens  implant, bilateral    . Coronary angioplasty with stent placement  08/2011    "1"  . Retinopathy surgery  2000's    "laser; both eyes"  . Wrist fusion  1976    right  . Anterior fusion cervical spine  2010    "C spine; Dr. Yetta French"  . Ankle fracture surgery  1976; ?date    LEFT:  fused; removed bulk of hardware  . Heel spur surgery  ~ 2007    ? left  . Irrigation and debridement knee  02/27/2012    Procedure: IRRIGATION AND DEBRIDEMENT KNEE;  Surgeon: Mark Rossetti, MD;  Location: [facility] OR;  Service: Orthopedics;  Laterality: Right;  irrigation and drainage right knee septic bursitis  . Eye surgery  2008    cataract removal, cautery  . Coronary artery bypass graft  1998    CABG X5  . Fracture surgery      foot broken 1976   Social History:  reports that he has never smoked. He has never used smokeless tobacco. He reports that he does not drink alcohol or use illicit drugs.  home. where does patient live--  can do ADLs.  Can patient participate in ADLs?  Allergies   Allergen Reactions  . Sildenafil Nausea Only and Other (See Comments)    "took it once; heart went to pieces; never took it again"  . Percocet [Oxycodone-Acetaminophen] Other (See Comments)    Unknown reaction-possible altered mental state-per wife.    . Contrast Media [Iodinated Diagnostic Agents] Nausea Only    Family History  Problem Relation Age of Onset  . Heart disease Mother     Coronary Artery Disease  . Heart disease Father     Coronary Artery Disease  . Heart disease Paternal Grandmother     Coronary Artery Disease  . Colon cancer Neg Hx   . Diabetes Other     Grandmother      Prior to Admission medications   Medication Sig Start Date End Date Taking? Authorizing Provider  albuterol (PROVENTIL HFA;VENTOLIN HFA) 108 (90 BASE) MCG/ACT inhaler Inhale 2 puffs into the lungs every 6 (six) hours as needed for wheezing or shortness of breath.    Yes Historical Provider, MD  albuterol (PROVENTIL) (2.5 MG/3ML) 0.083% nebulizer solution Take 2.5 mg by nebulization every 6 (six) hours as needed for wheezing or shortness of breath.    Yes Historical Provider, MD  aspirin 81 MG tablet Take 81 mg by mouth at bedtime.    Yes Historical Provider, MD  atorvastatin (LIPITOR) 40 MG tablet Take 40 mg by mouth every evening.   Yes Historical Provider, MD  budesonide (PULMICORT) 0.25 MG/2ML nebulizer solution Take 2 mLs (0.25 mg total) by nebulization 2 (two) times daily. Dx 496 03/06/13  Yes Mark S Parrett, NP  carvedilol (COREG) 6.25 MG tablet Take 1 tablet (6.25 mg total) by mouth 2 (two) times daily with a meal. 03/25/13  Yes Mark Belling, MD  clopidogrel (PLAVIX) 75 MG tablet Take 1 tablet (75 mg total) by mouth daily with breakfast. 06/05/12 06/05/13 Yes Mark Belling, MD  co-enzyme Q-10 30 MG capsule Take 30 mg by mouth daily.   Yes Historical Provider, MD  Docusate Sodium (STOOL SOFTENER) 100 MG capsule Take 100 mg by mouth daily as needed for constipation.    Yes Historical Provider, MD   gabapentin (NEURONTIN) 600 MG tablet Take 1 tablet (600 mg total) by mouth 3 (three) times daily. 11/20/12 11/20/13 Yes Mark Belling, MD  HYDROcodone-acetaminophen (NORCO) 10-325 MG per tablet Take 1 tablet by mouth every 6 (six) hours as needed for pain. 01/17/13  Yes Mark Belling, MD  insulin NPH (HUMULIN N,NOVOLIN N) 100 UNIT/ML injection Inject 70 Units into the skin daily before breakfast.   Yes Historical Provider, MD  ipratropium (ATROVENT) 0.02 % nebulizer solution Take 500 mcg by nebulization 2 (two) times daily.   Yes Historical Provider, MD  isosorbide mononitrate (IMDUR) 30 MG 24 hr tablet Take 1 tablet (30 mg total) by mouth daily. 11/20/12 11/20/13 Yes Casimiro Needle  Excell Seltzer, MD  losartan (COZAAR) 50 MG tablet Take 50 mg by mouth every morning.    Yes Historical Provider, MD  nitroGLYCERIN (NITROSTAT) 0.4 MG SL tablet Place 1 tablet (0.4 mg total) under the tongue every 5 (five) minutes as needed for chest pain. 10/29/12  Yes Tonny Bollman, MD  pantoprazole (PROTONIX) 40 MG tablet Take 40 mg by mouth daily.  03/14/13  Yes Historical Provider, MD  potassium chloride SA (K-DUR,KLOR-CON) 20 MEQ tablet Take 1 tablet (20 mEq total) by mouth 2 (two) times daily. 01/31/13  Yes Tonny Bollman, MD  silodosin (RAPAFLO) 8 MG CAPS capsule Take 8 mg by mouth every evening.    Yes Historical Provider, MD  spironolactone (ALDACTONE) 25 MG tablet Take 0.5 tablets (12.5 mg total) by mouth daily. 11/20/12  Yes Tonny Bollman, MD  torsemide (DEMADEX) 20 MG tablet Take 60 mg by mouth 2 (two) times daily.   Yes Historical Provider, MD   Physical Exam: Filed Vitals:   03/27/13 0430 03/27/13 0503 03/27/13 0530 03/27/13 0605  BP: 110/36 93/37 116/46 117/47  Pulse: 64 59 62 59  Temp:    97.8 F (36.6 C)  TempSrc:    Oral  Resp:  22  19  Height:    5\' 4"  (1.626 m)  SpO2: 97% 95% 96% 100%     General:   well-developed and nourished.  Eyes:  anicteric no pallor.   ENT:  no discharge from the ears eyes nose  mouth.  Neck:  no mass felt.   Cardiovascular:  S1-S2 heard.  Respiratory: No rhonchi or crepitations.   Abdomen:  soft nontender bowel sounds present.   Skin: Bruise present her lower extremities.   Musculoskeletal:  no edema. Chest pain is reproducible on deep palpation on the left side.   Psychiatric:  appears normal.  Neurologic: Alert awake oriented to time place and person. Moves all extremities.   Labs on Admission:  Basic Metabolic Panel:  Recent Labs Lab 03/21/13 1018 03/23/13 1722 03/26/13 2227  NA 129* 130* 131*  K 4.2 3.6 4.3  CL 93* 91* 92*  CO2 32 30 29  GLUCOSE 105* 92 100*  BUN 25* 34* 38*  CREATININE 1.5 1.62* 1.72*  CALCIUM 8.7 9.1 9.1   Liver Function Tests:  Recent Labs Lab 03/23/13 1722  AST 22  ALT 19  ALKPHOS 83  BILITOT 0.4  PROT 6.9  ALBUMIN 3.4*    Recent Labs Lab 03/23/13 1722  LIPASE 29   No results found for this basename: AMMONIA,  in the last 168 hours CBC:  Recent Labs Lab 03/23/13 1722 03/26/13 0820 03/26/13 2227  WBC 7.3 7.7 7.6  NEUTROABS  --   --  5.7  HGB 12.5* 12.5* 12.4*  HCT 37.1* 36.9* 36.8*  MCV 85.1 84.4 85.6  PLT 185 127* 189   Cardiac Enzymes:  Recent Labs Lab 03/26/13 0820  TROPONINI <0.30    BNP (last 3 results)  Recent Labs  04/26/12 1944 08/02/12 1114 03/01/13 1940  PROBNP 788.0* 248.0* 417.4*   CBG:  Recent Labs Lab 03/26/13 0802  GLUCAP 120*    Radiological Exams on Admission: Dg Chest 2 View  03/27/2013   *RADIOLOGY REPORT*  Clinical Data: Mid to lower thoracic spine pain and chest pain secondary to a fall.  CHEST - 2 VIEW  Comparison: 03/01/2013  Findings: Heart size and pulmonary vascularity are normal.  Prior CABG.  Minimal scarring in the lingula.  Lungs are otherwise clear. No effusions. The thoracic spine  appears normal.  IMPRESSION: No acute abnormality.   Original Report Authenticated By: Francene Boyers, M.D.   Dg Thoracic Spine W/swimmers  03/27/2013    *RADIOLOGY REPORT*  Clinical Data: Mid to lower thoracic spine pain secondary to a fall.  THORACIC SPINE - 2 VIEW + SWIMMERS  Comparison: None.  Findings: There is no fracture or bone destruction or subluxation. There are slight degenerative changes in the mid to lower thoracic spine.  IMPRESSION: No acute abnormality of the thoracic spine.   Original Report Authenticated By: Francene Boyers, M.D.   Dg Tibia/fibula Left  03/26/2013   *RADIOLOGY REPORT*  Clinical Data: Recent traumatic injury with pain  LEFT TIBIA AND FIBULA - 2 VIEW  Comparison: 04/26/2012  Findings: Postsurgical changes are again noted in the left foot. No acute fracture or dislocation is noted.  No soft tissue abnormality is seen.  IMPRESSION: No acute abnormality noted.   Original Report Authenticated By: Alcide Clever, M.D.   Ct Head Wo Contrast  03/27/2013   *RADIOLOGY REPORT*  Clinical Data: Trauma secondary to a fall.  CT HEAD WITHOUT CONTRAST  Technique:  Contiguous axial images were obtained from the base of the skull through the vertex without contrast.  Comparison: 08/29/2011  Findings: There is no acute intracranial hemorrhage, infarction, or mass lesion.  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 There is abnormal lucency in the pons consistent with a chronic ischemic change.  No acute osseous abnormality.  IMPRESSION: No acute abnormality.  Old left parietal infarct.  Old ischemic changes in the pons, stable.   Original Report Authenticated By: Francene Boyers, M.D.     Assessment/Plan Active Problems:   Chronic systolic heart failure   RENAL INSUFFICIENCY   DM2 (diabetes mellitus, type 2)   Chronic kidney disease, stage III (moderate)   Chest pain   Syncope   Rib pain on left side   1. Syncope  - monitor in telemetry for any arrhythmias. Check orthostatics. EKG cardiac markers are pending. Physical therapy consult may need rehabilitation placement.  2. Left-sided chest pain reproducible  -  x-rays rib series ordered.  3. CAD status post CABG  - EKG and cardiac markers are pending.  4. Chronic systolic heart failure - presently looks compensated. Continue diuretics. 5. Diabetes mellitus type 2 - continue home medications with close followup of CBGs. 6. Chronic kidney disease - creatinine appears at baseline.    Code Status:  full code.  Family Communication: None.  Disposition Plan:  admit for observation.     Aalyssa Elderkin N. [facility] Pager [phone].   If 7PM-7AM, please contact night-coverage [email or web address] Password [facility] 03/27/2013, 6:17 AM

## 2013-03-27 NOTE — ED Notes (Signed)
While this nurse was discharging the patient and wheeling him out in wheelchair, pt states "i cant go home like this" and moaning in pain. Dr. Norlene Campbell notified and told patient she will work on getting him admitted. Pt brought back to room A-7 and hooked back up to monitor.

## 2013-03-28 ENCOUNTER — Inpatient Hospital Stay (HOSPITAL_COMMUNITY): Payer: Medicare Other

## 2013-03-28 ENCOUNTER — Other Ambulatory Visit: Payer: Self-pay

## 2013-03-28 DIAGNOSIS — R079 Chest pain, unspecified: Secondary | ICD-10-CM

## 2013-03-28 DIAGNOSIS — R55 Syncope and collapse: Secondary | ICD-10-CM

## 2013-03-28 DIAGNOSIS — I5022 Chronic systolic (congestive) heart failure: Secondary | ICD-10-CM

## 2013-03-28 DIAGNOSIS — L0291 Cutaneous abscess, unspecified: Secondary | ICD-10-CM

## 2013-03-28 LAB — BASIC METABOLIC PANEL
BUN: 33 mg/dL — ABNORMAL HIGH (ref 6–23)
CO2: 25 mEq/L (ref 19–32)
Calcium: 8.9 mg/dL (ref 8.4–10.5)
Creatinine, Ser: 1.38 mg/dL — ABNORMAL HIGH (ref 0.50–1.35)
Glucose, Bld: 109 mg/dL — ABNORMAL HIGH (ref 70–99)

## 2013-03-28 LAB — GLUCOSE, CAPILLARY
Glucose-Capillary: 141 mg/dL — ABNORMAL HIGH (ref 70–99)
Glucose-Capillary: 180 mg/dL — ABNORMAL HIGH (ref 70–99)
Glucose-Capillary: 167 mg/dL — ABNORMAL HIGH (ref 70–99)

## 2013-03-28 MED ORDER — POLYETHYLENE GLYCOL 3350 17 G PO PACK
17.0000 g | PACK | Freq: Two times a day (BID) | ORAL | Status: DC
  Administered 2013-03-28 – 2013-03-29 (×2): 17 g via ORAL
  Filled 2013-03-28 (×4): qty 1

## 2013-03-28 MED ORDER — DOCUSATE SODIUM 100 MG PO CAPS
100.0000 mg | ORAL_CAPSULE | Freq: Two times a day (BID) | ORAL | Status: DC
  Administered 2013-03-28 – 2013-03-29 (×2): 100 mg via ORAL
  Filled 2013-03-28 (×2): qty 1

## 2013-03-28 MED ORDER — IPRATROPIUM BROMIDE 0.02 % IN SOLN
500.0000 ug | Freq: Two times a day (BID) | RESPIRATORY_TRACT | Status: DC
  Administered 2013-03-29: 500 ug via RESPIRATORY_TRACT
  Filled 2013-03-28 (×2): qty 2.5

## 2013-03-28 MED ORDER — BUDESONIDE 0.25 MG/2ML IN SUSP
0.2500 mg | Freq: Two times a day (BID) | RESPIRATORY_TRACT | Status: DC
  Administered 2013-03-29: 0.25 mg via RESPIRATORY_TRACT
  Filled 2013-03-28 (×3): qty 2

## 2013-03-28 NOTE — Evaluation (Signed)
Occupational Therapy Evaluation Patient Details Name: Mark French MRN: [id number] DOB: 1946-03-03 Today's Date: 03/28/2013 Time: 6962-9528 OT Time Calculation (min): 32 min  OT Assessment / Plan / Recommendation History of present illness Mark French is a 67 y.o. male history of CAD status post CABG, COPD, chronic kidney disease, chronic systolic heart failure, legally blind, diabetes mellitus had a fall yesterday at his house. Patient states that he does not recall the incident exactly but lost his consciousness while standing and fell. He states he may have lost consciousness for a few seconds. He also hit his head and left side of the chest. Patient has been having frequent falls and had come to the ER for similar reason. Patient at this time feels that he may in addition need rehabilitation placement. Patient presently still has some chest pain of the left side over the ribs which is reproducible on palpation. Patient otherwise denies any shortness of breath or any focal deficits.   Clinical Impression   Pt is currently at a min guard to supervision level for selfcare tasks and functional selfcare related transfers.  Feel he is close to his normal baseline but still demonstrates limited endurance.  Recommend HHOT to further address deficits and evaluate home setup and function.  Recommend initial 24 hour supervision for safety secondary to pt's current weakness.  No further acute care OT needs.    OT Assessment  All further OT needs can be met in the next venue of care    Follow Up Recommendations  Home health OT       Equipment Recommendations  None recommended by OT          Precautions / Restrictions Precautions Precautions: Fall Restrictions Weight Bearing Restrictions: No   Pertinent Vitals/Pain Pain 2/10 on the faces scale in the left rib area, pt repositioned    ADL  Eating/Feeding: Simulated;Set up Where Assessed - Eating/Feeding: Chair Grooming: Simulated;Set  up Where Assessed - Grooming: Unsupported standing Upper Body Bathing: Simulated;Set up Where Assessed - Upper Body Bathing: Unsupported sitting Lower Body Bathing: Simulated;Min guard Where Assessed - Lower Body Bathing: Supported sit to stand Upper Body Dressing: Simulated;Set up Where Assessed - Upper Body Dressing: Unsupported sitting Lower Body Dressing: Performed;Min guard Where Assessed - Lower Body Dressing: Supported sit to stand Toilet Transfer: Simulated;Min Psychologist, sport and exercise: Other (comment) (simulated to bedside chair pt declined toileting) Toileting - Clothing Manipulation and Hygiene: Simulated;Min guard Where Assessed - Glass blower/designer Manipulation and Hygiene: Other (comment) (sit to stand from bedside chair) Equipment Used: Rolling walker Transfers/Ambulation Related to ADLs: Pt is currently min guard assist for mobility using the RW for support. ADL Comments: Pt explained to therapist about his setup in the bathroom at home.  Pt has grab bars at the toilet and in the shower as well as a built in shower seat.  He was able to donn both of his gripper socks with increased time and adaptive techniques secondary to RUE impairment.  He also discussed with therapist that he has a lighted magnifier at home and other adaptations secondary to his vision loss.  Feel he is probably close to baseline but will benefit from [facility] eval for safety as pt is alone for periods of time secondary to his wife working.      OT Diagnosis: Generalized weakness  OT Problem List: Decreased strength;Decreased activity tolerance;Impaired balance (sitting and/or standing);Obesity OT Treatment Interventions:        Visit Information  Last OT Received On:  03/28/13 Assistance Needed: +1 History of Present Illness: Mark French is a 67 y.o. male history of CAD status post CABG, COPD, chronic kidney disease, chronic systolic heart failure, legally blind, diabetes mellitus had a fall  yesterday at his house. Patient states that he does not recall the incident exactly but lost his consciousness while standing and fell. He states he may have lost consciousness for a few seconds. He also hit his head and left side of the chest. Patient has been having frequent falls and had come to the ER for similar reason. Patient at this time feels that he may in addition need rehabilitation placement. Patient presently still has some chest pain of the left side over the ribs which is reproducible on palpation. Patient otherwise denies any shortness of breath or any focal deficits.       Prior Functioning     Home Living Family/patient expects to be discharged to:: Private residence (feels better about going home today) Living Arrangements: Spouse/significant other Available Help at Discharge: Family;Available 24 hours/day Type of Home: House Home Access: Ramped entrance Home Layout: One level Home Equipment: Walker - 2 wheels;Cane - quad;Cane - single point;Grab bars - toilet;Grab bars - tub/shower;Hand held shower head;Wheelchair - power Prior Function Level of Independence: Independent Communication Communication: No difficulties Dominant Hand: Right         Vision/Perception Vision - History Baseline Vision: Legally blind Patient Visual Report: No change from baseline Vision - Assessment Vision Assessment: Vision not tested Perception Perception: Within Functional Limits Praxis Praxis: Intact   Cognition  Cognition Arousal/Alertness: Awake/alert Behavior During Therapy: WFL for tasks assessed/performed Overall Cognitive Status: Within Functional Limits for tasks assessed    Extremity/Trunk Assessment Upper Extremity Assessment Upper Extremity Assessment: RUE deficits/detail RUE Deficits / Details: Pt with history of CVA when he was 2 months old.  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  Uses for functional tasks at times but relys on the LUE priimarily.   RUE  Sensation: decreased light touch RUE Coordination: decreased fine motor;decreased gross motor Cervical / Trunk Assessment Cervical / Trunk Assessment: Normal     Mobility Transfers Sit to Stand: 4: Min guard;With upper extremity assist;From chair/3-in-1;From toilet Stand to Sit: 4: Min guard;With upper extremity assist;To chair/3-in-1;To toilet        Balance Balance Balance Assessed: Yes Static Standing Balance Static Standing - Balance Support: Right upper extremity supported;Left upper extremity supported Static Standing - Level of Assistance: 5: Stand by assistance   End of Session OT - End of Session Equipment Utilized During Treatment: Rolling walker Activity Tolerance: Patient limited by fatigue Patient left: in chair Nurse Communication: Mobility status     Ardelia Wrede OTR/L Pager number [phone] 03/28/2013, 3:55 PM

## 2013-03-28 NOTE — Progress Notes (Signed)
*  PRELIMINARY RESULTS* Vascular Ultrasound Carotid Duplex (Doppler) has been completed.  Findings suggest 40-59% right internal carotid artery stenosis, and 1-39% left internal carotid artery stenosis with peak systolic velocities suggesting borderline 40-59% stenosis. Vertebral arteries are patent with antegrade flow.  03/28/2013 11:27 AM Gertie Fey, RVT, RDCS, RDMS

## 2013-03-28 NOTE — Progress Notes (Signed)
Physical Therapy Treatment Patient Details Name: Mark French MRN: [id number] DOB: July 05, 1946 Today's Date: 03/28/2013 Time: 4098-1191 PT Time Calculation (min): 13 min  PT Assessment / Plan / Recommendation  History of Present Illness Mark French is a 67 y.o. male history of CAD status post CABG, COPD, chronic kidney disease, chronic systolic heart failure, legally blind, diabetes mellitus had a fall yesterday at his house. Patient states that he does not recall the incident exactly but lost his consciousness while standing and fell. He states he may have lost consciousness for a few seconds. He also hit his head and left side of the chest. Patient has been having frequent falls and had come to the ER for similar reason. Patient at this time feels that he may in addition need rehabilitation placement. Patient presently still has some chest pain of the left side over the ribs which is reproducible on palpation. Patient otherwise denies any shortness of breath or any focal deficits.   PT Comments   Moving well, but mildly self limiting  "I don't want to over do it...  But I won't stop."  Follow Up Recommendations  Home health PT     Does the patient have the potential to tolerate intense rehabilitation     Barriers to Discharge        Equipment Recommendations  None recommended by PT    Recommendations for Other Services    Frequency Min 3X/week   Progress towards PT Goals Progress towards PT goals: Progressing toward goals  Plan Current plan remains appropriate    Precautions / Restrictions Precautions Precautions: Fall Restrictions Weight Bearing Restrictions: No   Pertinent Vitals/Pain     Mobility  Bed Mobility Bed Mobility: Not assessed Transfers Transfers: Sit to Stand;Stand to Sit Sit to Stand: 4: Min guard;With upper extremity assist;From chair/3-in-1;From toilet Stand to Sit: 4: Min guard;With upper extremity assist;To chair/3-in-1;To toilet Details for  Transfer Assistance: use safe technique Ambulation/Gait Ambulation/Gait Assistance: 4: Min guard Ambulation Distance (Feet): 90 Feet Assistive device: Rolling walker Ambulation/Gait Assistance Details: guarded with small step length, but generally steady until he fatigues Gait Pattern: Step-through pattern Stairs: No Wheelchair Mobility Wheelchair Mobility: No    Exercises     PT Diagnosis:    PT Problem List:   PT Treatment Interventions:     PT Goals (current goals can now be found in the care plan section) Acute Rehab PT Goals PT Goal Formulation: With patient Time For Goal Achievement: 04/03/13 Potential to Achieve Goals: Good  Visit Information  Last PT Received On: 03/28/13 Assistance Needed: +1 History of Present Illness: Mark French is a 67 y.o. male history of CAD status post CABG, COPD, chronic kidney disease, chronic systolic heart failure, legally blind, diabetes mellitus had a fall yesterday at his house. Patient states that he does not recall the incident exactly but lost his consciousness while standing and fell. He states he may have lost consciousness for a few seconds. He also hit his head and left side of the chest. Patient has been having frequent falls and had come to the ER for similar reason. Patient at this time feels that he may in addition need rehabilitation placement. Patient presently still has some chest pain of the left side over the ribs which is reproducible on palpation. Patient otherwise denies any shortness of breath or any focal deficits.    Subjective Data  Subjective: I can't go very far...   Cognition  Cognition Arousal/Alertness: Awake/alert Behavior During Therapy:  WFL for tasks assessed/performed Overall Cognitive Status: Within Functional Limits for tasks assessed    Balance  Balance Balance Assessed: No  End of Session PT - End of Session Activity Tolerance: Patient tolerated treatment well Patient left: in chair;with call  bell/phone within reach;with chair alarm set Nurse Communication: Mobility status   GP     Melvinia Ashby, Eliseo Gum 03/28/2013, 10:30 AM 03/28/2013  [place], PT [phone]  (pager)

## 2013-03-28 NOTE — Care Management Note (Signed)
  Page 1 of 1   03/28/2013     11:34:38 AM   CARE MANAGEMENT NOTE 03/28/2013  Patient:  Mark French, Mark French   Account Number:  [id number]  Date Initiated:  03/28/2013  Documentation initiated by:  Ronny Flurry  Subjective/Objective Assessment:     Action/Plan:   Anticipated DC Date:     Anticipated DC Plan:  HOME W HOME HEALTH SERVICES         Choice offered to / List presented to:  C-3 Spouse        HH arranged  HH-2 PT  HH-3 OT      [facility] agency  [facility]   Status of service:  Completed, signed off Medicare Important Message given?   (If response is "NO", the following Medicare IM given date fields will be blank) Date Medicare IM given:   Date Additional Medicare IM given:    Discharge Disposition:    Per UR Regulation:    If discussed at Long Length of Stay Meetings, dates discussed:    Comments:

## 2013-03-28 NOTE — Progress Notes (Signed)
[facility] PROGRESS NOTE  Mark French ZOX:[id number] DOB: November 11, 1945 DOA: 03/26/2013 PCP: [name], MD Primary Cardiologist: Dr. Tonny Bollman.  Brief narrative 67 year old male with history of CAD, CABG, COPD, chronic kidney disease, chronic systolic CHF, legally blind, DM with peripheral neuropathy, HTN was admitted on 03/27/13 following the fall and? Syncope at home last night. Patient lives with spouse. Ambulates with the help of a cane and moves around with the help of an electric scooter. 6 days prior to admission, he fell while reversing his scooter from the [place], fell to the ground and the scooter landed on left leg. He denies loss of consciousness. Since then left leg has been hurting. Last night, patient tried to get up from a reclining chair to go to his scooter, ambulate in a couple of steps and the next thing he remembers falling on the floor. He can't remember whether he lost consciousness or not. He heard left side of his chest which is painful and left little finger.  Assessment/Plan: 1. Syncope/near syncope: Unclear etiology. No orthostatic changes. Echo 10/16/12: LVEF 45-50% and severe hypokinesis of inferior myocardium. Continue monitoring on telemetry. Carotid Dopplers 08/24/11: No significant right ICA stenosis. Left 49- 59% ICA stenosis. PT and OT evaluation. CT head without acute findings. Repeat carotid Dopplers: Right 40-59% ICA stenosis & left 40-59% ICA stenosis. PT recommends home health PT. Telemetry without arrhythmia alarms. Discussed with patient's primary cardiologist who will arrange for outpatient heart monitor on discharge. 2. Left-sided chest pain/left anterior fifth rib fracture: Reproducible suggesting musculoskeletal etiology from fall and trauma. Chest x-ray shows minimally displaced left anterior fifth rib fracture. Pain management. Discussed with surgeons-conservative/nonoperative management. 3. Possible left leg cellulitis: X-rays on 03/26/13 without  acute findings. Patient has small open wounds. Treat with IV vancomycin and follow clinically. Elevate left leg. Improving. 4. Chronic systolic CHF: Compensated. Continue diuretics. 5. Type II DM with peripheral neuropathy: Reasonable inpatient control. 6. Stage III chronic kidney disease: Creatinine at baseline. 7. Multiple falls: Possibly multifactorial from peripheral neuropathy, lower extremity pain from recent fall related injury, legal blindness and generally poor health. PT and OT evaluation.  8. CAD/CABG: Asymptomatic and stable 9. Hypertension: Controlled.  Code Status: Full  Family Communication: Discussed with spouse at bedside.  Disposition Plan: Possible DC home in the next 48 hours.   Consultants:  None  Procedures:  None  Antibiotics:  IV vancomycin 8/21 >   HPI/Subjective: Left-sided chest pain and left leg pain are better. Constipation.  Objective: Filed Vitals:   03/28/13 0231 03/28/13 0632 03/28/13 0906 03/28/13 1345  BP: 96/48 136/52  103/40  Pulse: 64 73  80  Temp: 97.9 F (36.6 C) 97.9 F (36.6 C)  97.6 F (36.4 C)  TempSrc: Oral Oral  Oral  Resp: 18 18  18   Height:      Weight:  98.703 kg (217 lb 9.6 oz)    SpO2: 100% 100% 100% 98%    Intake/Output Summary (Last 24 hours) at 03/28/13 1507 Last data filed at 03/28/13 0900  Gross per 24 hour  Intake    240 ml  Output      0 ml  Net    240 ml   Filed Weights   03/27/13 0605 03/28/13 8119  Weight: 103.1 kg (227 lb 4.7 oz) 98.703 kg (217 lb 9.6 oz)    Exam:   General exam:  moderately built and obese male patient, chronically ill-looking, sitting on chair and appears comfortable.   Respiratory system: Clear. No  increased work of breathing. Reproducible left anterior chest wall pain without external bruising.   Cardiovascular system: S1 & S2 heard, RRR. No JVD, murmurs, gallops, clicks. 1+ bilateral leg edema left >right. Patient has superficial poor 3 wounds on left shin. Darkish  discoloration below midshin bilaterally but left side has increased warmth and some redness-improving. Also tender to touch. Left fifth finger mildly swollen and tender. Telemetry: Sinus rhythm, first degree AV block and BB pattern.  Gastrointestinal system: Abdomen is nondistended, soft and nontender. Normal bowel sounds heard.  Central nervous system: Alert and oriented. No focal neurological deficits.Decreased visual acuity.   Extremities: Symmetric 5 x 5 power.   Data Reviewed: Basic Metabolic Panel:  Recent Labs Lab 03/23/13 1722 03/26/13 2227 03/27/13 0721 03/28/13 0555  NA 130* 131* 131* 130*  K 3.6 4.3 4.3 4.2  CL 91* 92* 93* 93*  CO2 30 29 25 25   GLUCOSE 92 100* 86 109*  BUN 34* 38* 36* 33*  CREATININE 1.62* 1.72* 1.57* 1.38*  CALCIUM 9.1 9.1 9.4 8.9   Liver Function Tests:  Recent Labs Lab 03/23/13 1722 03/27/13 0721  AST 22 20  ALT 19 16  ALKPHOS 83 77  BILITOT 0.4 0.5  PROT 6.9 7.3  ALBUMIN 3.4* 3.4*    Recent Labs Lab 03/23/13 1722  LIPASE 29   No results found for this basename: AMMONIA,  in the last 168 hours CBC:  Recent Labs Lab 03/23/13 1722 03/26/13 0820 03/26/13 2227 03/27/13 0721  WBC 7.3 7.7 7.6 8.0  NEUTROABS  --   --  5.7  --   HGB 12.5* 12.5* 12.4* 13.0  HCT 37.1* 36.9* 36.8* 37.5*  MCV 85.1 84.4 85.6 84.8  PLT 185 127* 189 188   Cardiac Enzymes:  Recent Labs Lab 03/26/13 0820 03/27/13 0721  TROPONINI <0.30 <0.30   BNP (last 3 results)  Recent Labs  04/26/12 1944 08/02/12 1114 03/01/13 1940  PROBNP 788.0* 248.0* 417.4*   CBG:  Recent Labs Lab 03/27/13 1150 03/27/13 1659 03/27/13 2213 03/28/13 0821 03/28/13 1132  GLUCAP 130* 121* 85 165* 141*    No results found for this or any previous visit (from the past 240 hour(s)).   Studies: Dg Chest 2 View  03/27/2013   *RADIOLOGY REPORT*  Clinical Data: Mid to lower thoracic spine pain and chest pain secondary to a fall.  CHEST - 2 VIEW  Comparison:  03/01/2013  Findings: Heart size and pulmonary vascularity are normal.  Prior CABG.  Minimal scarring in the lingula.  Lungs are otherwise clear. No effusions. The thoracic spine appears normal.  IMPRESSION: No acute abnormality.   Original Report Authenticated By: Francene Boyers, M.D.   Dg Ribs Unilateral Left  03/27/2013   *RADIOLOGY REPORT*  Clinical Data: Anterior left chest and rib pain post fall last night  LEFT RIBS - 2 VIEW  Comparison: Chest radiograph 03/26/2013  Findings: Bones appear mildly demineralized. Fracture identified at anterior left fifth rib, minimally displaced. No other fractures identified.  IMPRESSION: Minimally displaced anterior left fifth rib fracture.   Original Report Authenticated By: Ulyses Southward, M.D.   Dg Thoracic Spine W/swimmers  03/27/2013   *RADIOLOGY REPORT*  Clinical Data: Mid to lower thoracic spine pain secondary to a fall.  THORACIC SPINE - 2 VIEW + SWIMMERS  Comparison: None.  Findings: There is no fracture or bone destruction or subluxation. There are slight degenerative changes in the mid to lower thoracic spine.  IMPRESSION: No acute abnormality of the thoracic spine.  Original Report Authenticated By: Francene Boyers, M.D.   Ct Head Wo Contrast  03/27/2013   *RADIOLOGY REPORT*  Clinical Data: Trauma secondary to a fall.  CT HEAD WITHOUT CONTRAST  Technique:  Contiguous axial images were obtained from the base of the skull through the vertex without contrast.  Comparison: 08/29/2011  Findings: There is no acute intracranial hemorrhage, infarction, or mass lesion.  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 There is abnormal lucency in the pons consistent with a chronic ischemic change.  No acute osseous abnormality.  IMPRESSION: No acute abnormality.  Old left parietal infarct.  Old ischemic changes in the pons, stable.   Original Report Authenticated By: Francene Boyers, M.D.   Dg Hand Complete Left  03/27/2013   *RADIOLOGY REPORT*   Clinical Data: Object fell on hand with pain particularly in the fifth finger  LEFT HAND - COMPLETE 3+ VIEW  Comparison: None.  Findings: On the oblique view there is some irregularity of the base of the middle phalanx of the left fifth digit although no definite fracture is confirmed on either of the other views. Specific views of the left fifth digit may be helpful if further assessment is warranted.  Otherwise follow-up plain films in 1 week may be helpful.  The radiocarpal joint space appears normal.  There is some degenerative change on the radial aspect of the wrist particularly at the articulation of the trapezium with the navicula where there is loss of joint space and sclerosis.  There is also some degenerative change involving the MCP joints but no definite erosion is seen.  Degenerative changes are noted involving the DIP joints.  IMPRESSION:  1.  Slight irregularity of the trabecular pattern of the middle phalanx of the left fifth digit on the oblique view only.  A subtle fracture cannot be excluded and either specific views of the left fifth digit or follow-up films are recommended in 1 week. 2.  Degenerative changes as described above.   Original Report Authenticated By: Dwyane Dee, M.D.   Dg Finger Little Left  03/28/2013   *RADIOLOGY REPORT*  Clinical Data: Fifth digit pain  LEFT LITTLE FINGER 2+V  Comparison: 03/27/2013.  Findings: No acute fracture or dislocation is noted.  Mild soft tissue swelling is noted at the proximal interphalangeal joint.  IMPRESSION: Soft tissue changes without acute bony abnormality.  The changes seen on recent hand film are not borne out on this exam.   Original Report Authenticated By: Alcide Clever, M.D.     Additional labs:   Scheduled Meds: . aspirin EC  81 mg Oral QHS  . atorvastatin  40 mg Oral QPM  . budesonide  0.25 mg Nebulization BID  . carvedilol  6.25 mg Oral BID WC  . clopidogrel  75 mg Oral Q breakfast  . gabapentin  600 mg Oral TID  .  heparin  5,000 Units Subcutaneous Q8H  . insulin aspart  0-9 Units Subcutaneous TID WC  . insulin NPH  70 Units Subcutaneous QAC breakfast  . ipratropium  500 mcg Nebulization BID  . isosorbide mononitrate  30 mg Oral Daily  . losartan  50 mg Oral q morning - 10a  . pantoprazole  40 mg Oral Daily  . potassium chloride SA  20 mEq Oral BID  . sodium chloride  3 mL Intravenous Q12H  . sodium chloride  3 mL Intravenous Q12H  . spironolactone  12.5 mg Oral Daily  . tamsulosin  0.4 mg Oral QPC supper  .  torsemide  60 mg Oral BID   Continuous Infusions:   Active Problems:   Chronic systolic heart failure   RENAL INSUFFICIENCY   DM2 (diabetes mellitus, type 2)   Chronic kidney disease, stage III (moderate)   Chest pain   Syncope   Rib pain on left side    Time spent: 25 minutes.    [facility]  [facility] Pager [phone].   If 8PM-8AM, please contact night-coverage at [email or web address], password [facility] 03/28/2013, 3:07 PM  LOS: 2 days

## 2013-03-29 LAB — BASIC METABOLIC PANEL
Chloride: 94 mEq/L — ABNORMAL LOW (ref 96–112)
Creatinine, Ser: 1.71 mg/dL — ABNORMAL HIGH (ref 0.50–1.35)
GFR calc Af Amer: 45 mL/min — ABNORMAL LOW (ref >90)
Potassium: 4.4 mEq/L (ref 3.5–5.1)
Sodium: 131 mEq/L — ABNORMAL LOW (ref 135–145)

## 2013-03-29 LAB — GLUCOSE, CAPILLARY

## 2013-03-29 MED ORDER — POLYETHYLENE GLYCOL 3350 17 G PO PACK: 17.0000 g | PACK | Freq: Two times a day (BID) | ORAL | Status: DC

## 2013-03-29 MED ORDER — DOXYCYCLINE HYCLATE 100 MG PO TABS: 100.0000 mg | ORAL_TABLET | Freq: Two times a day (BID) | ORAL | Status: DC

## 2013-03-29 MED ORDER — DOCUSATE SODIUM 100 MG PO TABS: 100.0000 mg | ORAL_TABLET | Freq: Two times a day (BID) | ORAL | Status: DC

## 2013-03-29 NOTE — Progress Notes (Signed)
Discharge instructions gone over with patient. Follow up appointment to be made. Home health agency will be seeing patient for PT and OT. Diet, activity, and reasons to call 911 discussed. Home medications gone over.Syncopal episodes and what to do discussed. Discussed symptoms of heart failure patients and what to do, including weight gain of 3 pounds or more in 24 hours. Patient verbalized understanding of instructions.

## 2013-03-29 NOTE — Progress Notes (Signed)
   CARE MANAGEMENT NOTE 03/29/2013  Patient:  Mark French, Mark French   Account Number:  [id number]  Date Initiated:  03/28/2013  Documentation initiated by:  Ronny Flurry  Subjective/Objective Assessment:     Action/Plan:   Anticipated DC Date:  03/29/2013   Anticipated DC Plan:  HOME W HOME HEALTH SERVICES         [facility] Choice  HOME HEALTH   Choice offered to / List presented to:  C-3 Spouse        HH arranged  HH-2 PT  HH-3 OT      [facility] agency  [facility]   Status of service:  Completed, signed off Medicare Important Message given?   (If response is "NO", the following Medicare IM given date fields will be blank) Date Medicare IM given:   Date Additional Medicare IM given:    Discharge Disposition:  HOME W HOME HEALTH SERVICES  Per UR Regulation:    If discussed at Long Length of Stay Meetings, dates discussed:    Comments:  03/29/2013 1245 NCM spoke pt and agreeable to [facility]. Has RW at home. Isidoro Donning RN CCM Case Mgmt phone [phone]

## 2013-03-29 NOTE — Discharge Summary (Signed)
Physician Discharge Summary  Mark French ZOX:[id number] DOB: Nov 27, 1945 DOA: 03/26/2013  PCP: [name], MD  Admit date: 03/26/2013 Discharge date: 03/29/2013  Time spent: Greater than 30 minutes  Recommendations for Outpatient Follow-up:  1. Dr. [name], PCP in 5 days with repeat labs (CBC & BMP). 2. Dr. Tonny Bollman, Cardiology: MD's office will arrange for OP Heart Monitor. 3. Home Health PT & OT.  Discharge Diagnoses:  Active Problems:   Chronic systolic heart failure   RENAL INSUFFICIENCY   DM2 (diabetes mellitus, type 2)   Chronic kidney disease, stage III (moderate)   Chest pain   Syncope   Rib pain on left side   Discharge Condition: Improved & Stable  Diet recommendation: Heart Healthy & Diabetic Diet.  Filed Weights   03/27/13 0605 03/28/13 8119  Weight: 103.1 kg (227 lb 4.7 oz) 98.703 kg (217 lb 9.6 oz)    History of present illness:  67 year old male with history of CAD, CABG, COPD, chronic kidney disease, chronic systolic CHF, legally blind, DM with peripheral neuropathy, HTN was admitted on 03/27/13 following the fall and? Syncope at home last night. Patient lives with spouse. Ambulates with the help of a cane and moves around with the help of an electric scooter. 6 days prior to admission, he fell while reversing his scooter from the [place], fell to the ground and the scooter landed on left leg. He denies loss of consciousness. Since then left leg has been hurting. Last night, patient tried to get up from a reclining chair to go to his scooter, ambulate in a couple of steps and the next thing he remembers falling on the floor. He can't remember whether he lost consciousness or not. He hurt the left side of his chest which is painful and painful left little finger.   Hospital Course:  1. Syncope/near syncope: Unclear etiology. No orthostatic changes. Echo 10/16/12: LVEF 45-50% and severe hypokinesis of inferior myocardium. CT head without acute findings.  Repeat carotid Dopplers: Right 40-59% ICA stenosis & left 40-59% ICA stenosis. PT recommends home health PT. Telemetry without arrhythmia alarms. Discussed with patient's primary cardiologist who will arrange for outpatient heart monitor on discharge. Asymptomatic. 2. Left-sided chest pain/left anterior fifth rib fracture: Reproducible suggesting musculoskeletal etiology from fall and trauma. Chest x-ray shows minimally displaced left anterior fifth rib fracture. Pain management. Discussed with surgeons-conservative/nonoperative management. Pain improved and controlled. 3. Possible left leg cellulitis: X-rays on 03/26/13 without acute findings. Patient has small open wounds-dry. Treated with IV vancomycin and follow clinically. Elevate left leg. Improving. We'll discharge on oral doxycycline to complete a total 7 days treatment. 4. Chronic systolic CHF/cardiomyopathy: Compensated. Continue diuretics. 5. Type II DM with peripheral neuropathy: Reasonable inpatient control. 6. Stage III chronic kidney disease: Creatinine at baseline. Creatinine fluctuates between 1.3-1.8. Followup BMP in 5 days. 7. Multiple falls: Possibly multifactorial from peripheral neuropathy, lower extremity pain from recent fall related injury, legal blindness and generally poor health. PT and OT evaluation.  8. CAD/CABG: Asymptomatic and stable 9. Hypertension: Controlled. 10. Hyponatremia:? SIADH versus mild hypervolemia from CHF. Stable. Outpatient followup.   Procedures:  None   Consultations:  None  Discharge Exam:  Complaints: Patient feels much better. Left-sided chest pain controlled, occasional and minimal. Left leg swelling and pain have improved. Redness has also decreased.  Filed Vitals:   03/28/13 1722 03/28/13 2140 03/29/13 0643 03/29/13 0938  BP: 105/36 106/41 120/46   Pulse:  66 98   Temp:  98.2 F (36.8 C)  97.4 F (36.3 C)   TempSrc:  Oral Oral   Resp:  18 18   Height:      Weight:      SpO2:   98% 100% 100%     General exam: moderately built and obese male patient, chronically ill-looking, sitting on chair and appears comfortable.   Respiratory system: Clear. No increased work of breathing. Reproducible left anterior chest wall pain without external bruising.   Cardiovascular system: S1 & S2 heard, RRR. No JVD, murmurs, gallops, clicks. 1+ bilateral leg edema left >right. Patient has superficial 3 wounds on left shin- dry. Darkish discoloration below midshin bilaterally but left side has increased warmth and some redness-continue to improve. Non tender. Left fifth finger mildly swollen and tender- much improved. Telemetry: Sinus rhythm, first degree AV block and BB pattern.   Gastrointestinal system: Abdomen is nondistended, soft and nontender. Normal bowel sounds heard.   Central nervous system: Alert and oriented. No focal neurological deficits.Decreased visual acuity.   Extremities: Symmetric 5 x 5 power.  Discharge Instructions      Discharge Orders   Future Appointments Provider Department Dept Phone   04/15/2013 9:00 AM Kalman Shan, MD [facility] [phone]   04/24/2013 2:30 PM Tonny Bollman, MD [facility] Main Office [place]) [phone]   Future Orders Complete By Expires   (HEART FAILURE PATIENTS) Call MD:  Anytime you have any of the following symptoms: 1) 3 pound weight gain in 24 hours or 5 pounds in 1 week 2) shortness of breath, with or without a dry hacking cough 3) swelling in the hands, feet or stomach 4) if you have to sleep on extra pillows at night in order to breathe.  As directed    Call MD for:  difficulty breathing, headache or visual disturbances  As directed    Call MD for:  extreme fatigue  As directed    Call MD for:  persistant dizziness or light-headedness  As directed    Call MD for:  redness, tenderness, or signs of infection (pain, swelling, redness, odor or green/yellow discharge around incision site)  As directed     Call MD for:  severe uncontrolled pain  As directed    Call MD for:  temperature >100.4  As directed    Call MD for:  As directed    Comments:     Episodes of fainting.   Diet - low sodium heart healthy  As directed    Diet Carb Modified  As directed    Increase activity slowly  As directed        Medication List         albuterol 108 (90 BASE) MCG/ACT inhaler  Commonly known as:  PROVENTIL HFA;VENTOLIN HFA  Inhale 2 puffs into the lungs every 6 (six) hours as needed for wheezing or shortness of breath.     albuterol (2.5 MG/3ML) 0.083% nebulizer solution  Commonly known as:  PROVENTIL  Take 2.5 mg by nebulization every 6 (six) hours as needed for wheezing or shortness of breath.     aspirin 81 MG tablet  Take 81 mg by mouth at bedtime.     atorvastatin 40 MG tablet  Commonly known as:  LIPITOR  Take 40 mg by mouth every evening.     budesonide 0.25 MG/2ML nebulizer solution  Commonly known as:  PULMICORT  Take 2 mLs (0.25 mg total) by nebulization 2 (two) times daily. Dx 496     carvedilol 6.25 MG tablet  Commonly  known as:  COREG  Take 1 tablet (6.25 mg total) by mouth 2 (two) times daily with a meal.     clopidogrel 75 MG tablet  Commonly known as:  PLAVIX  Take 1 tablet (75 mg total) by mouth daily with breakfast.     co-enzyme Q-10 30 MG capsule  Take 30 mg by mouth daily.     Docusate Sodium 100 MG capsule  Commonly known as:  STOOL SOFTENER  Take 1 tablet (100 mg total) by mouth 2 (two) times daily.     doxycycline 100 MG tablet  Commonly known as:  VIBRA-TABS  Take 1 tablet (100 mg total) by mouth 2 (two) times daily.     gabapentin 600 MG tablet  Commonly known as:  NEURONTIN  Take 1 tablet (600 mg total) by mouth 3 (three) times daily.     HYDROcodone-acetaminophen 10-325 MG per tablet  Commonly known as:  NORCO  Take 1 tablet by mouth every 6 (six) hours as needed for pain.     insulin NPH 100 UNIT/ML injection  Commonly known as:  HUMULIN  N,NOVOLIN N  Inject 70 Units into the skin daily before breakfast.     ipratropium 0.02 % nebulizer solution  Commonly known as:  ATROVENT  Take 500 mcg by nebulization 2 (two) times daily.     isosorbide mononitrate 30 MG 24 hr tablet  Commonly known as:  IMDUR  Take 1 tablet (30 mg total) by mouth daily.     losartan 50 MG tablet  Commonly known as:  COZAAR  Take 50 mg by mouth every morning.     nitroGLYCERIN 0.4 MG SL tablet  Commonly known as:  NITROSTAT  Place 1 tablet (0.4 mg total) under the tongue every 5 (five) minutes as needed for chest pain.     pantoprazole 40 MG tablet  Commonly known as:  PROTONIX  Take 40 mg by mouth daily.     polyethylene glycol packet  Commonly known as:  MIRALAX / GLYCOLAX  Take 17 g by mouth 2 (two) times daily.     potassium chloride SA 20 MEQ tablet  Commonly known as:  K-DUR,KLOR-CON  Take 1 tablet (20 mEq total) by mouth 2 (two) times daily.     RAPAFLO 8 MG Caps capsule  Generic drug:  silodosin  Take 8 mg by mouth every evening.     spironolactone 25 MG tablet  Commonly known as:  ALDACTONE  Take 0.5 tablets (12.5 mg total) by mouth daily.     torsemide 20 MG tablet  Commonly known as:  DEMADEX  Take 60 mg by mouth 2 (two) times daily.       Follow-up Information   Follow up with [name], MD. Schedule an appointment as soon as possible for a visit in 1 week. (To be seen with repeat labs (CBC & BMP))    Specialty:  Endocrinology   Contact information:   [address] [place] [address] [phone]       Follow up with Tonny Bollman, MD. (MD's office will call to arrange out patient Heart Monitor. Please call them if you don't hear back in 2-3 days.)    Specialty:  Cardiology   Contact information:   [address] [place] [address] [phone]        The results of significant diagnostics from this hospitalization (including imaging, microbiology, ancillary and  laboratory) are listed below for reference.  Significant Diagnostic Studies: Ct Abdomen Pelvis Wo Contrast  03/23/2013   *RADIOLOGY REPORT*  Clinical Data: Abdominal pain; recent trauma  CT ABDOMEN AND PELVIS WITHOUT CONTRAST  Technique:  Multidetector CT imaging of the abdomen and pelvis was performed following the standard protocol without oral or intravenous contrast. Intravenous contrast was not administered due to history of allergic reaction to iodinated contrast material.  Comparison: CT pelvis September 12, 2011 and CT abdomen and pelvis February 20, 2005  Findings: There is mild scarring in the left lung base.  Lung bases are otherwise clear. There is a small hiatal hernia.  No focal liver lesions are identified on this noncontrast enhanced study.  There is no apparent liver laceration or rupture.  There is no perihepatic fluid.  There is no appreciable biliary duct dilatation.  Spleen appears intact without laceration or rupture on this noncontrast enhanced study.  No focal splenic lesions are identified.  Pancreas and adrenals appear normal.  Kidneys bilaterally show no hydronephrosis or mass.  There is no calculus in either kidney.  No traumatic appearing lesion is seen involving either kidney.  There is no ureteral calculus or ureterectasis on either side.  There is a small ventral hernia containing only fat.  In the pelvis, there is no mass or fluid collection.  There is no periappendiceal region inflammation.  There is no bowel obstruction.  No free air or portal venous air. There is no ascites, adenopathy, or abscess in the pelvis.  There is no mesenteric or bowel wall thickening.  There is no lesion involving the abdominal wall.  There is atherosclerotic change in the aorta but no aneurysm. There is degenerative type change in the lumbar spine.  There is evidence of old trauma involving the left iliac crest.  No acute appearing fracture is seen.  IMPRESSION:  No traumatic appearing lesion is seen.   No inflammatory focus appreciated.  There is a small hiatal hernia as well as a small ventral hernia. The ventral hernia only contains fat.  There is atherosclerotic change in the aorta.  No renal or ureteral calculus.  No hydronephrosis.  Comment:  The absence of intravenous contrast does make evaluation for more subtle traumatic lesion is less than ideal.   Original Report Authenticated By: Bretta Bang, M.D.   Dg Chest 2 View  03/27/2013   *RADIOLOGY REPORT*  Clinical Data: Mid to lower thoracic spine pain and chest pain secondary to a fall.  CHEST - 2 VIEW  Comparison: 03/01/2013  Findings: Heart size and pulmonary vascularity are normal.  Prior CABG.  Minimal scarring in the lingula.  Lungs are otherwise clear. No effusions. The thoracic spine appears normal.  IMPRESSION: No acute abnormality.   Original Report Authenticated By: Francene Boyers, M.D.   Dg Chest 2 View  03/01/2013   *RADIOLOGY REPORT*  Clinical Data: Shortness of breath, chest pain  CHEST - 2 VIEW  Comparison: 02/21/2013  Findings: Lungs are clear. No pleural effusion or pneumothorax.  Cardiomegaly. Postsurgical changes related to prior CABG.  Degenerative changes of the visualized thoracolumbar spine. Cervical spine fixation hardware.  IMPRESSION: No evidence of acute cardiopulmonary disease.   Original Report Authenticated By: Charline Bills, M.D.   Dg Ribs Unilateral Left  03/27/2013   *RADIOLOGY REPORT*  Clinical Data: Anterior left chest and rib pain post fall last night  LEFT RIBS - 2 VIEW  Comparison: Chest radiograph 03/26/2013  Findings: Bones appear mildly demineralized. Fracture identified at anterior left fifth rib, minimally displaced. No other fractures  identified.  IMPRESSION: Minimally displaced anterior left fifth rib fracture.   Original Report Authenticated By: Ulyses Southward, M.D.   Dg Thoracic Spine W/swimmers  03/27/2013   *RADIOLOGY REPORT*  Clinical Data: Mid to lower thoracic spine pain secondary to a fall.   THORACIC SPINE - 2 VIEW + SWIMMERS  Comparison: None.  Findings: There is no fracture or bone destruction or subluxation. There are slight degenerative changes in the mid to lower thoracic spine.  IMPRESSION: No acute abnormality of the thoracic spine.   Original Report Authenticated By: Francene Boyers, M.D.   Dg Elbow Complete Left  03/21/2013   *RADIOLOGY REPORT*  Clinical Data: Larey Seat.  Left elbow pain.  LEFT ELBOW - COMPLETE 3+ VIEW  Comparison: None  Findings: The joint spaces are maintained.  Mild degenerative changes.  No acute fracture or joint effusion.  IMPRESSION: No acute bony findings.   Original Report Authenticated By: Rudie Meyer, M.D.   Dg Hip Complete Left  03/21/2013   *RADIOLOGY REPORT*  Clinical Data: Trauma post fall left knee pain, left hip pain  LEFT HIP - COMPLETE 2+ VIEW  Comparison: None.  Findings: Three views of the left hip submitted.  No acute fracture or subluxation.  There is diffuse osteopenia.  Mild spurring of superior acetabulum.  Minimal spurring of the greater femoral trochanter.  IMPRESSION: Suboptimal study due to diffuse osteopenia.  No definite fracture or subluxation.  Mild spurring of superior acetabulum.   Original Report Authenticated By: Natasha Mead, M.D.   Dg Tibia/fibula Left  03/26/2013   *RADIOLOGY REPORT*  Clinical Data: Recent traumatic injury with pain  LEFT TIBIA AND FIBULA - 2 VIEW  Comparison: 04/26/2012  Findings: Postsurgical changes are again noted in the left foot. No acute fracture or dislocation is noted.  No soft tissue abnormality is seen.  IMPRESSION: No acute abnormality noted.   Original Report Authenticated By: Alcide Clever, M.D.   Ct Head Wo Contrast  03/27/2013   *RADIOLOGY REPORT*  Clinical Data: Trauma secondary to a fall.  CT HEAD WITHOUT CONTRAST  Technique:  Contiguous axial images were obtained from the base of the skull through the vertex without contrast.  Comparison: 08/29/2011  Findings: There is no acute intracranial  hemorrhage, infarction, or mass lesion.  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 There is abnormal lucency in the pons consistent with a chronic ischemic change.  No acute osseous abnormality.  IMPRESSION: No acute abnormality.  Old left parietal infarct.  Old ischemic changes in the pons, stable.   Original Report Authenticated By: Francene Boyers, M.D.   Dg Knee Complete 4 Views Left  03/21/2013   *RADIOLOGY REPORT*  Clinical Data: Larey Seat.  Left knee pain.  LEFT KNEE - COMPLETE 4+ VIEW  Comparison: None  Findings: The joint spaces are maintained.  No acute fracture or osteochondral abnormality.  No definite joint effusion.  Vascular calcifications are noted.  IMPRESSION: No acute fracture.   Original Report Authenticated By: Rudie Meyer, M.D.   Dg Hand Complete Left  03/27/2013   *RADIOLOGY REPORT*  Clinical Data: Object fell on hand with pain particularly in the fifth finger  LEFT HAND - COMPLETE 3+ VIEW  Comparison: None.  Findings: On the oblique view there is some irregularity of the base of the middle phalanx of the left fifth digit although no definite fracture is confirmed on either of the other views. Specific views of the left fifth digit may be helpful if further assessment is warranted.  Otherwise  follow-up plain films in 1 week may be helpful.  The radiocarpal joint space appears normal.  There is some degenerative change on the radial aspect of the wrist particularly at the articulation of the trapezium with the navicula where there is loss of joint space and sclerosis.  There is also some degenerative change involving the MCP joints but no definite erosion is seen.  Degenerative changes are noted involving the DIP joints.  IMPRESSION:  1.  Slight irregularity of the trabecular pattern of the middle phalanx of the left fifth digit on the oblique view only.  A subtle fracture cannot be excluded and either specific views of the left fifth digit or follow-up films are  recommended in 1 week. 2.  Degenerative changes as described above.   Original Report Authenticated By: Dwyane Dee, M.D.   Dg Finger Little Left  03/28/2013   *RADIOLOGY REPORT*  Clinical Data: Fifth digit pain  LEFT LITTLE FINGER 2+V  Comparison: 03/27/2013.  Findings: No acute fracture or dislocation is noted.  Mild soft tissue swelling is noted at the proximal interphalangeal joint.  IMPRESSION: Soft tissue changes without acute bony abnormality.  The changes seen on recent hand film are not borne out on this exam.   Original Report Authenticated By: Alcide Clever, M.D.    Microbiology: No results found for this or any previous visit (from the past 240 hour(s)).   Labs: Basic Metabolic Panel:  Recent Labs Lab 03/23/13 1722 03/26/13 2227 03/27/13 0721 03/28/13 0555 03/29/13 0553  NA 130* 131* 131* 130* 131*  K 3.6 4.3 4.3 4.2 4.4  CL 91* 92* 93* 93* 94*  CO2 30 29 25 25 28   GLUCOSE 92 100* 86 109* 117*  BUN 34* 38* 36* 33* 27*  CREATININE 1.62* 1.72* 1.57* 1.38* 1.71*  CALCIUM 9.1 9.1 9.4 8.9 9.2   Liver Function Tests:  Recent Labs Lab 03/23/13 1722 03/27/13 0721  AST 22 20  ALT 19 16  ALKPHOS 83 77  BILITOT 0.4 0.5  PROT 6.9 7.3  ALBUMIN 3.4* 3.4*    Recent Labs Lab 03/23/13 1722  LIPASE 29   No results found for this basename: AMMONIA,  in the last 168 hours CBC:  Recent Labs Lab 03/23/13 1722 03/26/13 0820 03/26/13 2227 03/27/13 0721  WBC 7.3 7.7 7.6 8.0  NEUTROABS  --   --  5.7  --   HGB 12.5* 12.5* 12.4* 13.0  HCT 37.1* 36.9* 36.8* 37.5*  MCV 85.1 84.4 85.6 84.8  PLT 185 127* 189 188   Cardiac Enzymes:  Recent Labs Lab 03/26/13 0820 03/27/13 0721  TROPONINI <0.30 <0.30   BNP: BNP (last 3 results)  Recent Labs  04/26/12 1944 08/02/12 1114 03/01/13 1940  PROBNP 788.0* 248.0* 417.4*   CBG:  Recent Labs Lab 03/28/13 0821 03/28/13 1132 03/28/13 1709 03/28/13 2143 03/29/13 0807  GLUCAP 165* 141* 180* 167* 131*    Additional  labs:  TSH: 0.684   Signed:  Anis Cinelli  [facility] 03/29/2013, 12:26 PM

## 2013-03-31 ENCOUNTER — Telehealth: Payer: Self-pay

## 2013-03-31 NOTE — Telephone Encounter (Signed)
Pt coming in at 10:30 on Tuesday

## 2013-03-31 NOTE — Telephone Encounter (Signed)
hosp f/u ov is due this week. We'll discuss, but usually nothing can be done except to give it time.

## 2013-03-31 NOTE — Telephone Encounter (Signed)
Pt's wife left voicemail stating she pt fell down ramp on scooter last week and fractured his ribs, should pt follow-up with ortho, please advise [phone]

## 2013-04-01 ENCOUNTER — Ambulatory Visit (INDEPENDENT_AMBULATORY_CARE_PROVIDER_SITE_OTHER): Payer: Medicare Other | Admitting: Endocrinology

## 2013-04-01 ENCOUNTER — Encounter: Payer: Self-pay | Admitting: Endocrinology

## 2013-04-01 VITALS — BP 134/80 | HR 74

## 2013-04-01 DIAGNOSIS — N183 Chronic kidney disease, stage 3 unspecified: Secondary | ICD-10-CM

## 2013-04-01 DIAGNOSIS — D509 Iron deficiency anemia, unspecified: Secondary | ICD-10-CM

## 2013-04-01 DIAGNOSIS — E1049 Type 1 diabetes mellitus with other diabetic neurological complication: Secondary | ICD-10-CM

## 2013-04-01 LAB — CBC WITH DIFFERENTIAL/PLATELET
Basophils Absolute: 0 10*3/uL (ref 0.0–0.1)
HCT: 34.3 % — ABNORMAL LOW (ref 39.0–52.0)
Lymphs Abs: 0.8 10*3/uL (ref 0.7–4.0)
MCV: 85.1 fl (ref 78.0–100.0)
Monocytes Absolute: 0.8 10*3/uL (ref 0.1–1.0)
Monocytes Relative: 10 % (ref 3.0–12.0)
Platelets: 239 10*3/uL (ref 150.0–400.0)
RDW: 15.4 % — ABNORMAL HIGH (ref 11.5–14.6)

## 2013-04-01 LAB — BASIC METABOLIC PANEL
BUN: 29 mg/dL — ABNORMAL HIGH (ref 6–23)
GFR: 50.8 mL/min — ABNORMAL LOW (ref >60.00)
Glucose, Bld: 136 mg/dL — ABNORMAL HIGH (ref 70–99)
Potassium: 4.3 mEq/L (ref 3.5–5.1)

## 2013-04-01 NOTE — Patient Instructions (Addendum)
Please continue the same insulin. For now, it is ok to increase the hydrocodone to every 4 hrs as needed. Call if pharmacy won't refill it this soon. Please stop the naproxen. It is ok to take pantoprazole daily, and famotidine as needed.   You will get better much faster if you elevate your left leg above the rest of your body. blood tests are being requested for you today.  We'll contact you with results. Please come back for a follow-up appointment in 1-2 months.

## 2013-04-01 NOTE — Progress Notes (Signed)
Subjective:    Patient ID: Mark French, male    DOB: 10-Nov-1945, 67 y.o.   MRN: [id number]  HPI The state of at least three ongoing medical problems is addressed today, with interval history of each noted here: Pt returns for f/u of insulin-requiring DM (dx'ed 1995; complicated by CAD, renal insufficiency, peripheral sensory neuropathy and retinopathy; therapy has been limited by pt's need for a simple, inexpensive insulin regimen).  wife brings a record of his cbg's which i have reviewed today.  All are in the 100's.  There is no trend throughout the day. Cellulitis: eft leg erythema is slightly worse. He fx a rib when he fell: pain is better with a back brace. He has not tolerated percocet in the past.   Past Medical History  Diagnosis Date  . CORONARY ARTERY DISEASE 03/11/2007    s/p CABG 1998;  admitted 08/2011 with an inferior STEMI.  LHC 09/07/11: Severe  Three-vessel CAD, LIMA-LAD patent, SVG-RCA chronically occluded, SVG-circumflex chronically occluded, SVG-diagonal 95%.  PCI: Promus DES to the SVG-diagonal.  Procedure c/b transient CHB req CPR and TTVP;  myoview 4/13:large severe inferolateral infarct, no ischemia, EF 25%    . DIABETES MELLITUS, TYPE I 03/11/2007  . HYPERTENSION 03/11/2007  . 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 HYPERCHOLESTEROLEMIA 09/28/2008  . CERVICAL RADICULOPATHY, LEFT 09/23/2008  . PERIPHERAL NEUROPATHY 09/23/2008  . Chronic systolic heart failure 09/23/2008    Echocardiogram 09/07/11: Difficult study, inferior and posterior lateral wall hypokinesis, mild LVH, EF 45%, mild LAE.   [name] RENAL INSUFFICIENCY 03/15/2010  . ED (erectile dysfunction)   . Hypogonadism male   . GI bleed 2012    Multiple Duodenal ulcer   . Obesity   . Legally blind     bilaterally  . Stroke 70    age 73 months; residual right sided weakness  . History of blood transfusion 2012  . Charcot foot due to diabetes mellitus     chronic pain  . Osteoarthritis     right hip and  shoulder  . COPD (chronic obstructive pulmonary disease)   . Myocardial infarction 2013b Jan  . PERIPHERAL VASCULAR INSUFFICIENCY,  LEGS, BILATERAL 08/17/2010    ABI's performed 04/19/11 R 0.72 L 0.71  . ANEMIA-IRON DEFICIENCY 09/23/2008    ulcer related last year  . Chronic edema     Chronic pedal edema    Past Surgical History  Procedure Laterality Date  . Carpal tunnel release      left  . Sigmoidoscopy  03/11/2001  . Venous doppler  01/30/2004  . Tonsillectomy and adenoidectomy      "as a kid"  . Cataract extraction w/ intraocular lens  implant, bilateral    . Coronary angioplasty with stent placement  08/2011    "1"  . Retinopathy surgery  2000's    "laser; both eyes"  . Wrist fusion  1976    right  . Anterior fusion cervical spine  2010    "C spine; Dr. Yetta Barre"  . Ankle fracture surgery  1976; ?date    LEFT:  fused; removed bulk of hardware  . Heel spur surgery  ~ 2007    ? left  . Irrigation and debridement knee  02/27/2012    Procedure: IRRIGATION AND DEBRIDEMENT KNEE;  Surgeon: Verlee Rossetti, MD;  Location: [facility] OR;  Service: Orthopedics;  Laterality: Right;  irrigation and drainage right knee septic bursitis  . Eye surgery  2008    cataract removal, cautery  . Coronary artery bypass  graft  1998    CABG X5  . Fracture surgery      foot broken 1976    History   Social History  . Marital Status: Married    Spouse Name: N/A    Number of Children: 0  . Years of Education: N/A   Occupational History  . Disabled    Social History Main Topics  . Smoking status: Never Smoker   . Smokeless tobacco: Never Used  . Alcohol Use: No  . Drug Use: No  . Sexual Activity: No   Other Topics Concern  . Not on file   Social History Narrative   Comes to appointments with wife      0 Caffeine drinks daily     Current Outpatient Prescriptions on File Prior to Visit  Medication Sig Dispense Refill  . albuterol (PROVENTIL HFA;VENTOLIN HFA) 108 (90 BASE) MCG/ACT inhaler  Inhale 2 puffs into the lungs every 6 (six) hours as needed for wheezing or shortness of breath.       [name] albuterol (PROVENTIL) (2.5 MG/3ML) 0.083% nebulizer solution Take 2.5 mg by nebulization every 6 (six) hours as needed for wheezing or shortness of breath.       [name] aspirin 81 MG tablet Take 81 mg by mouth at bedtime.       [name] atorvastatin (LIPITOR) 40 MG tablet Take 40 mg by mouth every evening.      . budesonide (PULMICORT) 0.25 MG/2ML nebulizer solution Take 2 mLs (0.25 mg total) by nebulization 2 (two) times daily. Dx 496  120 mL  12  . carvedilol (COREG) 6.25 MG tablet Take 1 tablet (6.25 mg total) by mouth 2 (two) times daily with a meal.  60 tablet  6  . clopidogrel (PLAVIX) 75 MG tablet Take 1 tablet (75 mg total) by mouth daily with breakfast.  90 tablet  3  . co-enzyme Q-10 30 MG capsule Take 30 mg by mouth daily.      Tery Sanfilippo Sodium (STOOL SOFTENER) 100 MG capsule Take 1 tablet (100 mg total) by mouth 2 (two) times daily.  60 tablet  0  . doxycycline (VIBRA-TABS) 100 MG tablet Take 1 tablet (100 mg total) by mouth 2 (two) times daily.  10 tablet  0  . gabapentin (NEURONTIN) 600 MG tablet Take 1 tablet (600 mg total) by mouth 3 (three) times daily.  90 tablet  4  . HYDROcodone-acetaminophen (NORCO) 10-325 MG per tablet Take 1 tablet by mouth every 6 (six) hours as needed for pain.  100 tablet  3  . insulin NPH (HUMULIN N,NOVOLIN N) 100 UNIT/ML injection Inject 70 Units into the skin daily before breakfast.      . ipratropium (ATROVENT) 0.02 % nebulizer solution Take 500 mcg by nebulization 2 (two) times daily.      . isosorbide mononitrate (IMDUR) 30 MG 24 hr tablet Take 1 tablet (30 mg total) by mouth daily.  30 tablet  6  . losartan (COZAAR) 50 MG tablet Take 50 mg by mouth every morning.       . nitroGLYCERIN (NITROSTAT) 0.4 MG SL tablet Place 1 tablet (0.4 mg total) under the tongue every 5 (five) minutes as needed for chest pain.  25 tablet  5  . pantoprazole (PROTONIX) 40 MG  tablet Take 40 mg by mouth daily.       . polyethylene glycol (MIRALAX / GLYCOLAX) packet Take 17 g by mouth 2 (two) times daily.  30 each  0  . potassium  chloride SA (K-DUR,KLOR-CON) 20 MEQ tablet Take 1 tablet (20 mEq total) by mouth 2 (two) times daily.  60 tablet  6  . silodosin (RAPAFLO) 8 MG CAPS capsule Take 8 mg by mouth every evening.       [name] spironolactone (ALDACTONE) 25 MG tablet Take 0.5 tablets (12.5 mg total) by mouth daily.  30 tablet  6  . torsemide (DEMADEX) 20 MG tablet Take 60 mg by mouth 2 (two) times daily.       No current facility-administered medications on file prior to visit.    Allergies  Allergen Reactions  . Sildenafil Nausea Only and Other (See Comments)    "took it once; heart went to pieces; never took it again"  . Percocet [Oxycodone-Acetaminophen] Other (See Comments)    Unknown reaction-possible altered mental state-per wife.    . Contrast Media [Iodinated Diagnostic Agents] Nausea Only    Family History  Problem Relation Age of Onset  . Heart disease Mother     Coronary Artery Disease  . Heart disease Father     Coronary Artery Disease  . Heart disease Paternal Grandmother     Coronary Artery Disease  . Colon cancer Neg Hx   . Diabetes Other     Grandmother   BP 134/80  Pulse 74  SpO2 97%  Review of Systems denies hypoglycemia and fever.      Objective:   Physical Exam VITAL SIGNS:  See vs page GENERAL: no distress.  In scooter.  Left leg: moderate erythema, and 1+ pretib edema.     Assessment & Plan:  Rib fx: pain needs increased rx DM: well-controlled Cellulitis, slightly worse.

## 2013-04-02 ENCOUNTER — Encounter: Payer: Self-pay | Admitting: *Deleted

## 2013-04-02 ENCOUNTER — Encounter (INDEPENDENT_AMBULATORY_CARE_PROVIDER_SITE_OTHER): Payer: Medicare Other

## 2013-04-02 DIAGNOSIS — R55 Syncope and collapse: Secondary | ICD-10-CM

## 2013-04-02 NOTE — Progress Notes (Signed)
Patient ID: Mark French, male   DOB: 06/17/1946, 67 y.o.   MRN: [id number] E-Cardio verite 30 day cardiac event monitor applied to patient.

## 2013-04-03 ENCOUNTER — Other Ambulatory Visit (HOSPITAL_COMMUNITY): Payer: Self-pay | Admitting: Orthopedic Surgery

## 2013-04-03 ENCOUNTER — Ambulatory Visit (HOSPITAL_COMMUNITY)
Admission: RE | Admit: 2013-04-03 | Discharge: 2013-04-03 | Disposition: A | Discharge status: Home / Self Care | Payer: Medicare Other | Source: Ambulatory Visit | Attending: Cardiovascular Disease | Admitting: Cardiovascular Disease

## 2013-04-03 DIAGNOSIS — M7989 Other specified soft tissue disorders: Secondary | ICD-10-CM | POA: Insufficient documentation

## 2013-04-03 DIAGNOSIS — I82402 Acute embolism and thrombosis of unspecified deep veins of left lower extremity: Secondary | ICD-10-CM

## 2013-04-03 DIAGNOSIS — I82409 Acute embolism and thrombosis of unspecified deep veins of unspecified lower extremity: Secondary | ICD-10-CM

## 2013-04-03 NOTE — Progress Notes (Signed)
Left Lower Extremity Venous Duplex Completed. Negative. Brianna L Mazza,RVT

## 2013-04-04 NOTE — Telephone Encounter (Signed)
Spoke with patient and wife who state patient had extreme pain in upper left chest near nipple area that started this morning.  Patient states he was in the hospital last week s/p fall and was found to have lower rib fracture.  Patient states the pain was so extreme that he felt like he was going to pass out.  Patient states that this does not feel like it is heart related.  Patient denies SOB, n/v, diaphoresis.  Patient states he attempted to give himself a neb this morning but had to stop due to pain.  Patient is wearing an E-cardio heart monitor and pressed the record button 20-30 min ago because of feeling like he would pass out.  Patient would like to talk with Dr. Excell Seltzer.  We are printing monitor report from 20-30 min ago when patient describes syncopal episode for review by Dr. Excell Seltzer.

## 2013-04-04 NOTE — Telephone Encounter (Signed)
New problem   Patient called the answer service this am   C/O having pain upper left around the nipple area around the chest. Patient fell recent and was  in the hospital for a few days find a fracture rib. Not sure if it's heart related or not.  Just in a lot of pain. Pain level 10. Took x 1  Pain med  early this am .

## 2013-04-04 NOTE — Telephone Encounter (Signed)
Dr. Excell Seltzer reviewed monitor report. I advised patient that Dr. Excell Seltzer advised that monitor reports are not concerning and that he does not believe patient's symptoms are cardiac in nature.  Dr. Excell Seltzer advised that patient f/u with [name] for pain.  Patient verbalized understanding.

## 2013-04-10 ENCOUNTER — Telehealth: Payer: Self-pay | Admitting: Endocrinology

## 2013-04-10 NOTE — Telephone Encounter (Signed)
I spoke with the pt and his wife and the pt is complaining of 10/10 pain in his left chest that goes through to his back.  This has been constant since this morning (the pt woke up at 3 AM) and the pt has been having this pain for a couple of days with improvement in symptoms from pain medication.  The pt feels like he needs to have another x-ray and get more pain medications.  I made the pt aware that I do not feel like this is heart related and most likely is coming from his fall. The pt's wife said she just left a message with Dr George Hugh office prior to calling Cardiology and is awaiting a return call.

## 2013-04-10 NOTE — Telephone Encounter (Signed)
Called pt and advised per Dr Everardo All ok to increase to 1 pill, every 4 hrs for pain. Pt states that is what he is doing right now. He said that is ok and he appreciates [name] calling.

## 2013-04-10 NOTE — Telephone Encounter (Signed)
Follow up    Patient wife calling     C/O a lot of pain in the chest area . Pain level is  10.

## 2013-04-10 NOTE — Telephone Encounter (Signed)
Ok to increase to 1 pill, q4h prn pain

## 2013-04-10 NOTE — Telephone Encounter (Signed)
Pt's wife states she spoke with cardiologist and he does not think this is heart related, thinks pain is coming form his fall,would like to know if pt can increase hyrdrocone, please advise

## 2013-04-14 ENCOUNTER — Telehealth: Payer: Self-pay | Admitting: Cardiovascular Disease

## 2013-04-14 NOTE — Telephone Encounter (Signed)
Pt's wife called because she said pt is SOB and feels chest tightness. Pt fell  August 27 th 2014, and has fracture ribs. Pt said the pain and SOB and tightness, comes and goes, some times he wills fine other times he feels worse. Pt  Has a 30 days event Monitor placed on pt on 04/02/13. With  no  Events at this time. According to pt he is taking Hydrocodone every 4 hours as needed for pain prescribed by his PCP , pt took his last medication at 9:00 AM , pt next dose should have been at 1:00 Pm; pt states has not taking the medication, because he would run out the medication, also he is afraid that he may overdose. Pt Was reassured that because he is in pain he is not able to expand his lungs and that is why he feels SOB. Pt was made aware that if he takes the medication as prescribed it would be safe for him to take. Pt would like for Dr. Excell Seltzer to know about it and see what he recommends.

## 2013-04-14 NOTE — Telephone Encounter (Signed)
New Problem   Pt is having chest pressure and SOB

## 2013-04-15 ENCOUNTER — Encounter: Payer: Self-pay | Admitting: Internal Medicine

## 2013-04-15 ENCOUNTER — Ambulatory Visit (INDEPENDENT_AMBULATORY_CARE_PROVIDER_SITE_OTHER): Payer: Medicare Other | Admitting: Internal Medicine

## 2013-04-15 VITALS — BP 118/62 | HR 56 | Temp 97.8°F | Ht 64.0 in | Wt 236.0 lb

## 2013-04-15 DIAGNOSIS — J449 Chronic obstructive pulmonary disease, unspecified: Secondary | ICD-10-CM

## 2013-04-15 DIAGNOSIS — Z23 Encounter for immunization: Secondary | ICD-10-CM

## 2013-04-15 NOTE — Progress Notes (Signed)
Subjective:    Patient ID: Mark French, male    DOB: 1946/07/03, 67 y.o.   MRN: [id number]  HPI 67 year old Body mass index is 36.48 kg/(m^2).  reports that he has never smoked. He has never used smokeless tobacco. 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  CAD and s/p CABG 1998 and now  MI in Sep 07, 2011 and s/p stent (cardiac arrest for '20 sec'). Went to [facility] nursing home and since then reports dyspnea on exertion. Progressive. Insidious onset. Also using roller assist to walk since MI in Jan 2013 but this is due to hip issues and diabetic retinopathy. Wife and he report associated significant deconditioning since Jan 2013. This is improving but not to the desired extent. Denies associated smoking or snoring. Dyspnea is associtaed with orthopnea and paroxysnal nocturnal dyspnea but this is chronic > 15 years and unchangd since jan 2013. HDyspnea is aggravated by "anything" which is at random and even at rest. Dyspnea relieved by advair disk started early maay 2013.    Exposure  reports that he has never smoked. He has never used smokeless tobacco. 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  12/05/11: Nuc med stress test Overall Impression: Abnormal stress nuclear study with a large, severe, fixed inferolateral defect consistent with previous infarct; no ischemia.  LV Ejection Fraction: 25% (note on Sep 07, 2011 ef 45% but no prior data available). LV Wall Motion: Inferolateral akinesis. Cards thought it was worse and meds adjusted. Per family no repepat cath due to renal function concern   Spirometry 12/20/11  - fev1 1.75L/53%, FVC 2.25L/73% and c/w restriction  Labs 12/31/11  creat 1.4mg %  hgb 11.9gm%  CXR 01/01/12 IMPRESSION:  No evidence of active pulmonary disease.  Original Report Authenticated By:  Marlon Pel, M  REC Difficult situation with shortness of breath  Your heart, weight, lack of fitness, diabetes, all contributing to shortness of breath I do not think there is lung disease per se and the abnormal breathing test probably reflects your weight  Unfortunately there is no magic bullet Continue cardiology advice Because you feel strongly that advair helps you, we will honor that and give you advair 1 puff twice daily and albuterol as needed   - my nurse will check if you need refills We will refer you to pulmonary rehab Followup 2 months or sooner if needed with full PFT   OV 03/15/2012  Followup dyspnea  Advair helping some. But overall no change in dyspnea. PFT overall unchanged despite advair. He has no new complaints  PFTs - Fev1 1.78L/68%, Ratio 76 (70). Small airways 55%, TLC 4L/73%, DLCO 16/68%  REC Difficult situation with shortness of breath  Your heart, weight, lack of fitness, diabetes, all contributing to shortness of breath In addition you likely have asthma/copd based on improvement with advair and breathing test results Unfortunately there is no magic bullet to help you because you cannot do rehab at hospital due to vision issues and joint issues I recommend you  - Continue cardiology advice  - continue advair  - Please start spiriva 1 puff daily - take sample, script and show technique  - I am tryoing to arrange a pulmonary program for you at home Return in 3 months or sooner if needed    OV  02/12/2013 FU dyspnea with asthma Says he is spending > $ 200 per month for advair/spiriva. Both are helping though but in bankrupting him. However,  He finds drugs to be useful and very helpful for dyspnea. Does not want tog ive up. I learned today he had smoke exposure a lot working as Company secretary. No new issues.  >stop spiriva/advair , begin albuterol/atrovent neb   03/06/2013 Acute OV  Complains of increased SOB since beginning atrovent and albuterol nebs at  last ov. Feels that since starting nebs and being off spiriva and advair , his breathing is not as good.  Was recently admitted 2 weeks ago , with decompensated CHF that responded to diuresis.  No chest pain, orthopnea, discolored mucus, fever or increased edema. Was not able to afford the advair and spiriva . Only using duoneb 2-3 times a day . Does not feel QVAR is working.  He is a never smoker.   Increase Albuterol and Ipratropium Neb Four times a day  Add Budesonide (Pulmicort ) Neb Twice daily  Stop QVAR .  follow up Dr. Marchelle Gearing in 6 weeks as planned  Please contact office for sooner follow up if symptoms do not improve or worsen or seek emergency care   OV 04/15/2013  Followup multifactorial dyspnea and asthma  - Admitted 03/26/2013 for 3 days following a fall possibly related to syncope that resulted in fractured ribs in the anterior fifth rib. Due to concern of syncope he is being discharged with a Holter monitor. His echocardiogram March 2014 had an ejection fraction of 45%. He was also treated at this admission for cellulitis of the left leg. He was discharged home physical therapy occupational therapy. Get a chest x-ray 03/28/2013 the did not show any pleural effusion or hemothorax or pneumonia  - Today 04/15/2013 he continues to be deconditioned due to obesity, venous stasis edema, cellulitis and chronic right upper extremity weakness and other mobility issues. Main issues are dyspnea and following the rib fracture and also mobility issues and high risk for fall. Asthma stable. Continues with nebulizers.     - Admitted 03/26/2013 Review of Systems  Constitutional: Negative for fever and unexpected weight change.  HENT: Negative for ear pain, nosebleeds, congestion, sore throat, rhinorrhea, sneezing, trouble swallowing, dental problem, postnasal drip and sinus pressure.   Eyes: Negative for redness and itching.  Respiratory: Positive for shortness of breath. Negative for cough,  chest tightness and wheezing.   Cardiovascular: Negative for palpitations and leg swelling.  Gastrointestinal: Negative for nausea and vomiting.  Genitourinary: Negative for dysuria.  Musculoskeletal: Negative for joint swelling.  Skin: Negative for rash.  Neurological: Negative for headaches.  Hematological: Does not bruise/bleed easily.  Psychiatric/Behavioral: Negative for dysphoric mood. The patient is not nervous/anxious.        Objective:   Physical Exam   Nursing note and vitals reviewed. Constitutional: He is oriented to person, place, and time. He appears well-developed and well-nourished. No distress.       Obese Legally blind  - sitting on a scooter Looks deconditioned  HENT:  Head: Normocephalic and atraumatic.  Right Ear: External ear normal.  Left Ear: External ear normal.  Mouth/Throat: Oropharynx is clear and moist. No oropharyngeal exudate.  Eyes: Conjunctivae and EOM are normal. Pupils are equal, round, and reactive to light. Right eye exhibits no discharge. Left eye exhibits no discharge. No scleral icterus.  Neck: Normal range of motion. Neck supple. No JVD present. No tracheal deviation present. No thyromegaly present.  Cardiovascular:  Normal rate, regular rhythm and intact distal pulses.  Exam reveals no gallop and no friction rub.   No murmur heard. Pulmonary/Chest: Effort normal and breath sounds normal. No respiratory distress. He has no wheezes. He has no rales. He exhibits no tenderness.  Abdominal: Soft. Bowel sounds are normal. He exhibits no distension and no mass. There is no tenderness. There is no rebound and no guarding.  Musculoskeletal: Poor function RUE Lymphadenopathy:    He has no cervical adenopathy.  Neurological: He is alert and oriented to person, place, and time. He has normal reflexes. No cranial nerve deficit. Coordination normal.  Skin: Skin is warm and dry. No rash noted. He is not diaphoretic. No erythema. No pallor.  Psychiatric:  He has a normal mood and affect. His behavior is normal. Judgment and thought content normal.           Assessment & Plan:

## 2013-04-15 NOTE — Patient Instructions (Addendum)
Asthma is stable Shortness of breath is from several  reasons that are difficult to fix Continue nebulizers Treat pain through PCP ELLISON, SEAN, MD Have flu shot 04/15/2013 at our office ROV 9 months 

## 2013-04-16 ENCOUNTER — Emergency Department (HOSPITAL_COMMUNITY)
Admission: EM | Admit: 2013-04-16 | Discharge: 2013-04-16 | Disposition: A | Discharge status: Home / Self Care | Payer: Medicare Other | Attending: Emergency Medicine | Admitting: Emergency Medicine

## 2013-04-16 ENCOUNTER — Encounter (HOSPITAL_COMMUNITY): Payer: Self-pay | Admitting: Emergency Medicine

## 2013-04-16 ENCOUNTER — Emergency Department (HOSPITAL_COMMUNITY): Payer: Medicare Other

## 2013-04-16 DIAGNOSIS — I10 Essential (primary) hypertension: Secondary | ICD-10-CM | POA: Insufficient documentation

## 2013-04-16 DIAGNOSIS — R0602 Shortness of breath: Secondary | ICD-10-CM | POA: Insufficient documentation

## 2013-04-16 DIAGNOSIS — Z7982 Long term (current) use of aspirin: Secondary | ICD-10-CM | POA: Insufficient documentation

## 2013-04-16 DIAGNOSIS — Z7902 Long term (current) use of antithrombotics/antiplatelets: Secondary | ICD-10-CM | POA: Insufficient documentation

## 2013-04-16 DIAGNOSIS — I5022 Chronic systolic (congestive) heart failure: Secondary | ICD-10-CM | POA: Insufficient documentation

## 2013-04-16 DIAGNOSIS — Z87448 Personal history of other diseases of urinary system: Secondary | ICD-10-CM | POA: Insufficient documentation

## 2013-04-16 DIAGNOSIS — R300 Dysuria: Secondary | ICD-10-CM | POA: Insufficient documentation

## 2013-04-16 DIAGNOSIS — M25551 Pain in right hip: Secondary | ICD-10-CM

## 2013-04-16 DIAGNOSIS — J4489 Other specified chronic obstructive pulmonary disease: Secondary | ICD-10-CM | POA: Insufficient documentation

## 2013-04-16 DIAGNOSIS — E11359 Type 2 diabetes mellitus with proliferative diabetic retinopathy without macular edema: Secondary | ICD-10-CM | POA: Insufficient documentation

## 2013-04-16 DIAGNOSIS — Z79899 Other long term (current) drug therapy: Secondary | ICD-10-CM | POA: Insufficient documentation

## 2013-04-16 DIAGNOSIS — E669 Obesity, unspecified: Secondary | ICD-10-CM | POA: Insufficient documentation

## 2013-04-16 DIAGNOSIS — I252 Old myocardial infarction: Secondary | ICD-10-CM | POA: Insufficient documentation

## 2013-04-16 DIAGNOSIS — Z9189 Other specified personal risk factors, not elsewhere classified: Secondary | ICD-10-CM | POA: Insufficient documentation

## 2013-04-16 DIAGNOSIS — M1990 Unspecified osteoarthritis, unspecified site: Secondary | ICD-10-CM | POA: Insufficient documentation

## 2013-04-16 DIAGNOSIS — H543 Unqualified visual loss, both eyes: Secondary | ICD-10-CM | POA: Insufficient documentation

## 2013-04-16 DIAGNOSIS — Z794 Long term (current) use of insulin: Secondary | ICD-10-CM | POA: Insufficient documentation

## 2013-04-16 DIAGNOSIS — E1039 Type 1 diabetes mellitus with other diabetic ophthalmic complication: Secondary | ICD-10-CM | POA: Insufficient documentation

## 2013-04-16 DIAGNOSIS — I2510 Atherosclerotic heart disease of native coronary artery without angina pectoris: Secondary | ICD-10-CM | POA: Insufficient documentation

## 2013-04-16 DIAGNOSIS — J449 Chronic obstructive pulmonary disease, unspecified: Secondary | ICD-10-CM | POA: Insufficient documentation

## 2013-04-16 DIAGNOSIS — Z8673 Personal history of transient ischemic attack (TIA), and cerebral infarction without residual deficits: Secondary | ICD-10-CM | POA: Insufficient documentation

## 2013-04-16 DIAGNOSIS — G609 Hereditary and idiopathic neuropathy, unspecified: Secondary | ICD-10-CM | POA: Insufficient documentation

## 2013-04-16 DIAGNOSIS — Z951 Presence of aortocoronary bypass graft: Secondary | ICD-10-CM | POA: Insufficient documentation

## 2013-04-16 DIAGNOSIS — R079 Chest pain, unspecified: Secondary | ICD-10-CM

## 2013-04-16 DIAGNOSIS — E7800 Pure hypercholesterolemia, unspecified: Secondary | ICD-10-CM | POA: Insufficient documentation

## 2013-04-16 DIAGNOSIS — R198 Other specified symptoms and signs involving the digestive system and abdomen: Secondary | ICD-10-CM | POA: Insufficient documentation

## 2013-04-16 DIAGNOSIS — R071 Chest pain on breathing: Secondary | ICD-10-CM | POA: Insufficient documentation

## 2013-04-16 DIAGNOSIS — Z862 Personal history of diseases of the blood and blood-forming organs and certain disorders involving the immune mechanism: Secondary | ICD-10-CM | POA: Insufficient documentation

## 2013-04-16 LAB — BASIC METABOLIC PANEL
BUN: 26 mg/dL — ABNORMAL HIGH (ref 6–23)
CO2: 29 mEq/L (ref 19–32)
Calcium: 9.1 mg/dL (ref 8.4–10.5)
Chloride: 94 mEq/L — ABNORMAL LOW (ref 96–112)
Creatinine, Ser: 1.41 mg/dL — ABNORMAL HIGH (ref 0.50–1.35)
GFR calc Af Amer: 57 mL/min — ABNORMAL LOW (ref >90)
GFR calc non Af Amer: 49 mL/min — ABNORMAL LOW (ref >90)
Glucose, Bld: 83 mg/dL (ref 70–99)
Potassium: 3.5 mEq/L (ref 3.5–5.1)
Sodium: 132 mEq/L — ABNORMAL LOW (ref 135–145)

## 2013-04-16 LAB — CBC
HCT: 37 % — ABNORMAL LOW (ref 39.0–52.0)
MCH: 29 pg (ref 26.0–34.0)
MCHC: 33.2 g/dL (ref 30.0–36.0)
MCV: 87.3 fL (ref 78.0–100.0)
RDW: 14.9 % (ref 11.5–15.5)
WBC: 6.3 10*3/uL (ref 4.0–10.5)

## 2013-04-16 LAB — URINALYSIS, ROUTINE W REFLEX MICROSCOPIC
Bilirubin Urine: NEGATIVE
Ketones, ur: NEGATIVE mg/dL
Nitrite: NEGATIVE
pH: 7.5 (ref 5.0–8.0)

## 2013-04-16 LAB — POCT I-STAT TROPONIN I: Troponin i, poc: 0.01 ng/mL (ref 0.00–0.08)

## 2013-04-16 MED ORDER — HYDROCODONE-ACETAMINOPHEN 5-325 MG PO TABS
2.0000 | ORAL_TABLET | Freq: Once | ORAL | Status: AC
  Administered 2013-04-16: 2 via ORAL
  Filled 2013-04-16: qty 2

## 2013-04-16 MED ORDER — ASPIRIN 81 MG PO CHEW
324.0000 mg | CHEWABLE_TABLET | Freq: Once | ORAL | Status: AC
  Administered 2013-04-16: 324 mg via ORAL
  Filled 2013-04-16: qty 4

## 2013-04-16 NOTE — ED Notes (Signed)
Patient trying to use urinal at this time.   Patient didn't want staff in his room while he was trying to go.

## 2013-04-16 NOTE — ED Notes (Signed)
Wife at bedside to help pt into wheelchair.

## 2013-04-16 NOTE — ED Notes (Signed)
Patient states woke up at 0500 with chest pain and shortness of breath this morning.  Patient states it was worse when he was in the shower.   Pain described as dull intermittent pain 8/10.   Patient NAD at this time.

## 2013-04-16 NOTE — ED Provider Notes (Signed)
CSN: [id number]     Arrival date & time 04/16/13  4098 History   First MD Initiated Contact with Patient 04/16/13 [id number]     Chief Complaint  Patient presents with  . Chest Pain  . Shortness of Breath   (Consider location/radiation/quality/duration/timing/severity/associated sxs/prior Treatment) HPI Comments: Mark French is a 67 y.o. Male who noticed chest pain and shortness about this morning. Took a shower and the discomfort got worse. He took a single nitroglycerin at home, but it did not change. The pain. The pain is moderate, described as 8/10. He did not eat breakfast this morning. He has not had any anorexia. He had no difficulty yesterday. He denies recent illnesses. He had a fall 3 weeks ago and required a short term observation in the hospital. He saw his PCP after the fall. No changes were made in his treatment plan, at that time. He saw his cardiologist for a routine checkup about one month ago. He had a cardiac stress test this year. He denies cough, fever, or chills, nausea or vomiting. He has had some difficulty urinating recently, which is not unusual for him. He takes a medicine to help encourage urinary output. His bowels have been moving somewhat slowly. Recently. He denies frank constipation. There are no other known modifying factors.   Patient is a 67 y.o. male presenting with chest pain and shortness of breath. The history is provided by the patient.  Chest Pain Associated symptoms: shortness of breath   Shortness of Breath Associated symptoms: chest pain     Past Medical History  Diagnosis Date  . CORONARY ARTERY DISEASE 03/11/2007    s/p CABG 1998;  admitted 08/2011 with an inferior STEMI.  LHC 09/07/11: Severe  Three-vessel CAD, LIMA-LAD patent, SVG-RCA chronically occluded, SVG-circumflex chronically occluded, SVG-diagonal 95%.  PCI: Promus DES to the SVG-diagonal.  Procedure c/b transient CHB req CPR and TTVP;  myoview 4/13:large severe inferolateral infarct, no  ischemia, EF 25%    . DIABETES MELLITUS, TYPE I 03/11/2007  . HYPERTENSION 03/11/2007  . 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 HYPERCHOLESTEROLEMIA 09/28/2008  . CERVICAL RADICULOPATHY, LEFT 09/23/2008  . PERIPHERAL NEUROPATHY 09/23/2008  . Chronic systolic heart failure 09/23/2008    Echocardiogram 09/07/11: Difficult study, inferior and posterior lateral wall hypokinesis, mild LVH, EF 45%, mild LAE.   [name] RENAL INSUFFICIENCY 03/15/2010  . ED (erectile dysfunction)   . Hypogonadism male   . GI bleed 2012    Multiple Duodenal ulcer   . Obesity   . Legally blind     bilaterally  . Stroke 59    age 66 months; residual right sided weakness  . History of blood transfusion 2012  . Charcot foot due to diabetes mellitus     chronic pain  . Osteoarthritis     right hip and shoulder  . COPD (chronic obstructive pulmonary disease)   . Myocardial infarction 2013b Jan  . PERIPHERAL VASCULAR INSUFFICIENCY,  LEGS, BILATERAL 08/17/2010    ABI's performed 04/19/11 R 0.72 French 0.71  . ANEMIA-IRON DEFICIENCY 09/23/2008    ulcer related last year  . Chronic edema     Chronic pedal edema   Past Surgical History  Procedure Laterality Date  . Carpal tunnel release      left  . Sigmoidoscopy  03/11/2001  . Venous doppler  01/30/2004  . Tonsillectomy and adenoidectomy      "as a kid"  . Cataract extraction w/ intraocular lens  implant, bilateral    . Coronary angioplasty  with stent placement  08/2011    "1"  . Retinopathy surgery  2000's    "laser; both eyes"  . Wrist fusion  1976    right  . Anterior fusion cervical spine  2010    "C spine; Dr. Yetta Barre"  . Ankle fracture surgery  1976; ?date    LEFT:  fused; removed bulk of hardware  . Heel spur surgery  ~ 2007    ? left  . Irrigation and debridement knee  02/27/2012    Procedure: IRRIGATION AND DEBRIDEMENT KNEE;  Surgeon: Verlee Rossetti, MD;  Location: [facility] OR;  Service: Orthopedics;  Laterality: Right;  irrigation and drainage right knee  septic bursitis  . Eye surgery  2008    cataract removal, cautery  . Coronary artery bypass graft  1998    CABG X5  . Fracture surgery      foot broken 1976   Family History  Problem Relation Age of Onset  . Heart disease Mother     Coronary Artery Disease  . Heart disease Father     Coronary Artery Disease  . Heart disease Paternal Grandmother     Coronary Artery Disease  . Colon cancer Neg Hx   . Diabetes Other     Grandmother   History  Substance Use Topics  . Smoking status: Never Smoker   . Smokeless tobacco: Never Used  . Alcohol Use: No    Review of Systems  Respiratory: Positive for shortness of breath.   Cardiovascular: Positive for chest pain.  All other systems reviewed and are negative.    Allergies  Sildenafil; Percocet; and Contrast media  Home Medications   Current Outpatient Rx  Name  Route  Sig  Dispense  Refill  . albuterol (PROVENTIL HFA;VENTOLIN HFA) 108 (90 BASE) MCG/ACT inhaler   Inhalation   Inhale 2 puffs into the lungs every 6 (six) hours as needed for wheezing or shortness of breath.          [name] albuterol (PROVENTIL) (2.5 MG/3ML) 0.083% nebulizer solution   Nebulization   Take 2.5 mg by nebulization every 6 (six) hours as needed for wheezing or shortness of breath.          [name] aspirin 81 MG tablet   Oral   Take 81 mg by mouth at bedtime.          [name] atorvastatin (LIPITOR) 40 MG tablet   Oral   Take 40 mg by mouth every evening.         . budesonide (PULMICORT) 0.25 MG/2ML nebulizer solution   Nebulization   Take 0.25 mg by nebulization 2 (two) times daily.         . carvedilol (COREG) 6.25 MG tablet   Oral   Take 6.25 mg by mouth 2 (two) times daily with a meal.         . clopidogrel (PLAVIX) 75 MG tablet   Oral   Take 1 tablet (75 mg total) by mouth daily with breakfast.   90 tablet   3   . co-enzyme Q-10 30 MG capsule   Oral   Take 30 mg by mouth daily.         Tery Sanfilippo Sodium (STOOL SOFTENER) 100 MG  capsule   Oral   Take 1 tablet (100 mg total) by mouth 2 (two) times daily.   60 tablet   0   . gabapentin (NEURONTIN) 600 MG tablet   Oral   Take 1 tablet (600 mg  total) by mouth 3 (three) times daily.   90 tablet   4   . HYDROcodone-acetaminophen (NORCO) 10-325 MG per tablet   Oral   Take 1 tablet by mouth every 6 (six) hours as needed for pain.         [name] ipratropium (ATROVENT) 0.02 % nebulizer solution   Nebulization   Take 500 mcg by nebulization 2 (two) times daily.         . isosorbide mononitrate (IMDUR) 30 MG 24 hr tablet   Oral   Take 30 mg by mouth daily.         [name] losartan (COZAAR) 50 MG tablet   Oral   Take 50 mg by mouth every morning.          . nitroGLYCERIN (NITROSTAT) 0.4 MG SL tablet   Sublingual   Place 1 tablet (0.4 mg total) under the tongue every 5 (five) minutes as needed for chest pain.   25 tablet   5   . pantoprazole (PROTONIX) 40 MG tablet   Oral   Take 40 mg by mouth daily.          . polyethylene glycol (MIRALAX / GLYCOLAX) packet   Oral   Take 17 g by mouth 2 (two) times daily.   30 each   0   . potassium chloride SA (K-DUR,KLOR-CON) 20 MEQ tablet   Oral   Take 1 tablet (20 mEq total) by mouth 2 (two) times daily.   60 tablet   6   . silodosin (RAPAFLO) 8 MG CAPS capsule   Oral   Take 8 mg by mouth every evening.          [name] spironolactone (ALDACTONE) 25 MG tablet   Oral   Take 0.5 tablets (12.5 mg total) by mouth daily.   30 tablet   6     pls disregard 45 day supply. Thanks [place] CMA   . torsemide (DEMADEX) 20 MG tablet   Oral   Take 60 mg by mouth 2 (two) times daily.         . insulin NPH (HUMULIN N,NOVOLIN N) 100 UNIT/ML injection   Subcutaneous   Inject 70 Units into the skin daily before breakfast.          BP 114/65  Pulse 56  Temp(Src) 98 F (36.7 C) (Oral)  Resp 15  Ht 5\' 1"  (1.549 m)  Wt 236 lb (107.049 kg)  BMI 44.61 kg/m2  SpO2 96% Physical Exam  Nursing note and vitals  reviewed. Constitutional: He is oriented to person, place, and time. He appears well-developed.  Obese, appears older than stated age. He is obese  HENT:  Head: Normocephalic and atraumatic.  Right Ear: External ear normal.  Left Ear: External ear normal.  Eyes: Conjunctivae and EOM are normal. Pupils are equal, round, and reactive to light.  Neck: Normal range of motion and phonation normal. Neck supple.  Cardiovascular: Normal rate, regular rhythm, normal heart sounds and intact distal pulses.   Pulmonary/Chest: Effort normal and breath sounds normal. He exhibits tenderness (Mild left anterior chest wall tenderness, without associated crepitation, or deformity.). He exhibits no bony tenderness.  Abdominal: Soft. Normal appearance. There is no rebound and no guarding.  He has a large panniculus. There is mild tenderness in the lower abdomen, with palpation.  Musculoskeletal: Normal range of motion.  Leg atrophy, bilaterally, consistent with disuse. Mild, right hip tenderness, but it does not limit, gentle active range of motion.  Neurological: He  is alert and oriented to person, place, and time. He has normal strength. No cranial nerve deficit or sensory deficit. He exhibits normal muscle tone. Coordination normal.  Skin: Skin is warm, dry and intact.  Nonspecific erythema of the legs, bilaterally; with scattered excoriations and healing sores. There are no areas of fluctuance or drainage.  Psychiatric: He has a normal mood and affect. His behavior is normal. Judgment and thought content normal.    ED Course  Procedures (including critical care time)  Medications  aspirin chewable tablet 324 mg (324 mg Oral Given 04/16/13 0822)  HYDROcodone-acetaminophen (NORCO/VICODIN) 5-325 MG per tablet 2 tablet (2 tablets Oral Given 04/16/13 1029)    Patient Vitals for the past 24 hrs:  BP Temp Temp src Pulse Resp SpO2 Height Weight  04/16/13 1215 114/65 mmHg - - 56 15 96 % - -  04/16/13 1130  107/41 mmHg - - 59 12 93 % - -  04/16/13 1015 106/40 mmHg - - 51 14 94 % - -  04/16/13 0915 111/40 mmHg - - 61 10 100 % - -  04/16/13 0737 113/80 mmHg 98 F (36.7 C) Oral 61 15 99 % - -  04/16/13 0727 - - - - - - 5\' 1"  (1.549 m) 236 lb (107.049 kg)   Bladder scan, by nursing was negative for urinary retention  11:00 PM Reevaluation with update and discussion. After initial assessment and treatment, an updated evaluation reveals his chest pain is better after treatment with narcotic, for right leg pain. He had requested his usual narcotic pain medicine, earlier. Mark French   12:27 PM Reevaluation with update and discussion. After initial assessment and treatment, an updated evaluation reveals he continues to be comfortable and denies chest pain at this time. Mark French    Date: 04/16/13  Rate: 67  Rhythm: normal sinus rhythm  QRS Axis: left  PR and QT Intervals: PR prolonged  ST/T Wave abnormalities: normal  PR and QRS Conduction Disutrbances:right bundle branch block  Narrative Interpretation:   Old EKG Reviewed: unchanged- 03/27/13  12:26 PM-Consult complete with Dr. Patient case explained and discussed. He agrees that patient  came to home and followup in the office next week for further evaluation and treatment. [name] He will arrange a followup appointment. Call ended at 12:33  Labs Review Labs Reviewed  CBC - Abnormal; Notable for the following:    Hemoglobin 12.3 (*)    HCT 37.0 (*)    All other components within normal limits  BASIC METABOLIC PANEL - Abnormal; Notable for the following:    Sodium 132 (*)    Chloride 94 (*)    BUN 26 (*)    Creatinine, Ser 1.41 (*)    GFR calc non Af Amer 49 (*)    GFR calc Af Amer 57 (*)    All other components within normal limits  URINALYSIS, ROUTINE W REFLEX MICROSCOPIC - Abnormal; Notable for the following:    Color, Urine STRAW (*)    All other components within normal limits  URINE CULTURE  POCT I-STAT TROPONIN I  POCT  I-STAT TROPONIN I   Imaging Review Dg Chest 2 View  04/16/2013   *RADIOLOGY REPORT*  Clinical Data: Shortness of breath  CHEST - 2 VIEW  Comparison: 03/27/2013, 03/26/2013  Findings: The cardiac shadow is mildly enlarged.  Postsurgical changes are seen.  The lungs are well-aerated without focal infiltrate or sizable effusion.  No acute bony abnormality is seen.  IMPRESSION: No acute abnormality noted.  Original Report Authenticated By: Alcide Clever, M.D.    MDM   1. Chest pain   2. Hip pain, right    Nonspecific chest pain with significant cardiac history and abnormal coronary artery disease and recent, cardiac stress test 4/14 that is reassuring. Doubt ACS, PE, aortic dissection or pneumonia. He is stable for discharge with outpatient followup.  Nursing Notes Reviewed/ Care Coordinated, and agree without changes. Applicable Imaging Reviewed.  Interpretation of Laboratory Data incorporated into ED treatment   Plan: Home Medications- usual; Home Treatments and Observation- rest, watch for progressive sx; return here if the recommended treatment, does not improve the symptoms; Recommended follow up- PCP, for check up within one week     Flint Melter, MD 04/16/13 [phone]

## 2013-04-16 NOTE — ED Notes (Signed)
Phlebotomy at bedside for blood draw.

## 2013-04-17 LAB — URINE CULTURE: Culture: NO GROWTH

## 2013-04-18 ENCOUNTER — Telehealth: Payer: Self-pay | Admitting: Endocrinology

## 2013-04-18 MED ORDER — HYDROCODONE-ACETAMINOPHEN 10-325 MG PO TABS: 1.0000 | ORAL_TABLET | ORAL | Status: DC | PRN

## 2013-04-18 NOTE — Telephone Encounter (Signed)
rx faxed

## 2013-04-18 NOTE — Telephone Encounter (Signed)
i printed 

## 2013-04-20 NOTE — Assessment & Plan Note (Signed)
Asthma is stable Shortness of breath is from several  reasons that are difficult to fix Continue nebulizers Treat pain through PCP Mark Belling, MD Have flu shot 04/15/2013 at our office ROV 9 months

## 2013-04-24 ENCOUNTER — Other Ambulatory Visit: Payer: Self-pay | Admitting: Cardiovascular Disease

## 2013-04-24 ENCOUNTER — Encounter: Payer: Self-pay | Admitting: Cardiovascular Disease

## 2013-04-24 ENCOUNTER — Ambulatory Visit (INDEPENDENT_AMBULATORY_CARE_PROVIDER_SITE_OTHER): Payer: Medicare Other | Admitting: Cardiovascular Disease

## 2013-04-24 VITALS — BP 104/58 | HR 58 | Ht 61.0 in | Wt 237.0 lb

## 2013-04-24 DIAGNOSIS — I5022 Chronic systolic (congestive) heart failure: Secondary | ICD-10-CM

## 2013-04-24 NOTE — Patient Instructions (Addendum)
Your physician recommends that you schedule a follow-up appointment in: 2 MONTHS with Dr Cooper  Your physician recommends that you continue on your current medications as directed. Please refer to the Current Medication list given to you today.  

## 2013-04-24 NOTE — Progress Notes (Signed)
HPI:   67 year old gentleman presenting for followup evaluation. The patient has congestive heart failure, morbid obesity, and extensive coronary artery disease. He also has COPD and chronic kidney disease. He's had remote CABG and then last year presented with an anterolateral infarction related to severe disease in the diagonal vein graft. He has severe left ventricular systolic dysfunction. He's had recurrent hospitalizations for heart failure.  The patient is feeling a little better over the last few days. He's been in and out of the emergency room many times. Unfortunately he is suffering from multiple medical problems and very poor health. He had an injection in his eye the other day to try to improve his vision. He feels like he can see better today than he has in quite some time. His chronic shortness of breath is unchanged. Leg swelling is also unchanged. His had no recent chest pain.   Outpatient Encounter Prescriptions as of 04/24/2013  Medication Sig Dispense Refill  . albuterol (PROVENTIL HFA;VENTOLIN HFA) 108 (90 BASE) MCG/ACT inhaler Inhale 2 puffs into the lungs every 6 (six) hours as needed for wheezing or shortness of breath.       [name] albuterol (PROVENTIL) (2.5 MG/3ML) 0.083% nebulizer solution Take 2.5 mg by nebulization every 6 (six) hours as needed for wheezing or shortness of breath.       [name] aspirin 81 MG tablet Take 81 mg by mouth at bedtime.       [name] atorvastatin (LIPITOR) 40 MG tablet Take 40 mg by mouth every evening.      . budesonide (PULMICORT) 0.25 MG/2ML nebulizer solution Take 0.25 mg by nebulization 2 (two) times daily.      . carvedilol (COREG) 6.25 MG tablet Take 6.25 mg by mouth 2 (two) times daily with a meal.      . clopidogrel (PLAVIX) 75 MG tablet Take 1 tablet (75 mg total) by mouth daily with breakfast.  90 tablet  3  . co-enzyme Q-10 30 MG capsule Take 30 mg by mouth daily.      Tery Sanfilippo Sodium (STOOL SOFTENER) 100 MG capsule Take 1 tablet (100 mg total)  by mouth 2 (two) times daily.  60 tablet  0  . gabapentin (NEURONTIN) 600 MG tablet Take 1 tablet (600 mg total) by mouth 3 (three) times daily.  90 tablet  4  . insulin NPH (HUMULIN N,NOVOLIN N) 100 UNIT/ML injection Inject 70 Units into the skin daily before breakfast.      . ipratropium (ATROVENT) 0.02 % nebulizer solution Take 500 mcg by nebulization 2 (two) times daily.      . isosorbide mononitrate (IMDUR) 30 MG 24 hr tablet Take 30 mg by mouth daily.      [name] losartan (COZAAR) 50 MG tablet Take 50 mg by mouth every morning.       . nitroGLYCERIN (NITROSTAT) 0.4 MG SL tablet Place 1 tablet (0.4 mg total) under the tongue every 5 (five) minutes as needed for chest pain.  25 tablet  5  . pantoprazole (PROTONIX) 40 MG tablet Take 40 mg by mouth daily.       . polyethylene glycol (MIRALAX / GLYCOLAX) packet Take 17 g by mouth 2 (two) times daily.  30 each  0  . potassium chloride SA (K-DUR,KLOR-CON) 20 MEQ tablet Take 1 tablet (20 mEq total) by mouth 2 (two) times daily.  60 tablet  6  . silodosin (RAPAFLO) 8 MG CAPS capsule Take 8 mg by mouth every evening.       [name]  spironolactone (ALDACTONE) 25 MG tablet Take 0.5 tablets (12.5 mg total) by mouth daily.  30 tablet  6  . torsemide (DEMADEX) 20 MG tablet Take 60 mg by mouth 2 (two) times daily.      [name] HYDROcodone-acetaminophen (NORCO) 10-325 MG per tablet Take 1 tablet by mouth every 4 (four) hours as needed for pain.  100 tablet  2   No facility-administered encounter medications on file as of 04/24/2013.    Allergies  Allergen Reactions  . Sildenafil Nausea Only and Other (See Comments)    "took it once; heart went to pieces; never took it again"  . Percocet [Oxycodone-Acetaminophen] Other (See Comments)    Unknown reaction-possible altered mental state-per wife.    . Contrast Media [Iodinated Diagnostic Agents] Nausea Only    Past Medical History  Diagnosis Date  . CORONARY ARTERY DISEASE 03/11/2007    s/p CABG 1998;  admitted 08/2011 with  an inferior STEMI.  LHC 09/07/11: Severe  Three-vessel CAD, LIMA-LAD patent, SVG-RCA chronically occluded, SVG-circumflex chronically occluded, SVG-diagonal 95%.  PCI: Promus DES to the SVG-diagonal.  Procedure c/b transient CHB req CPR and TTVP;  myoview 4/13:large severe inferolateral infarct, no ischemia, EF 25%    . DIABETES MELLITUS, TYPE I 03/11/2007  . HYPERTENSION 03/11/2007  . 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 HYPERCHOLESTEROLEMIA 09/28/2008  . CERVICAL RADICULOPATHY, LEFT 09/23/2008  . PERIPHERAL NEUROPATHY 09/23/2008  . Chronic systolic heart failure 09/23/2008    Echocardiogram 09/07/11: Difficult study, inferior and posterior lateral wall hypokinesis, mild LVH, EF 45%, mild LAE.   [name] RENAL INSUFFICIENCY 03/15/2010  . ED (erectile dysfunction)   . Hypogonadism male   . GI bleed 2012    Multiple Duodenal ulcer   . Obesity   . Legally blind     bilaterally  . Stroke 41    age 56 months; residual right sided weakness  . History of blood transfusion 2012  . Charcot foot due to diabetes mellitus     chronic pain  . Osteoarthritis     right hip and shoulder  . COPD (chronic obstructive pulmonary disease)   . Myocardial infarction 2013b Jan  . PERIPHERAL VASCULAR INSUFFICIENCY,  LEGS, BILATERAL 08/17/2010    ABI's performed 04/19/11 R 0.72 L 0.71  . ANEMIA-IRON DEFICIENCY 09/23/2008    ulcer related last year  . Chronic edema     Chronic pedal edema   BP 104/58  Pulse 58  Ht 5\' 1"  (1.549 m)  Wt 237 lb (107.502 kg)  BMI 44.8 kg/m2  SpO2 96%  PHYSICAL EXAM: Pt is alert and oriented, obese, chronically ill gentleman in a motorized scooter in NAD HEENT: normal Neck: JVP - normal Lungs: CTA bilaterally CV: RRR without murmur or gallop Abd: soft, NT, Positive BS Ext: Diffuse lower extremity edema with stasis ulcerations Skin: warm/dry no rash  ASSESSMENT AND PLAN: 1. Chronic systolic heart failure, [place] Heart Association class III. Patient's medications  were reviewed. Will continue the same program. Have extensively reviewed dietary restrictions with him in the past.  2. Coronary artery disease status post CABG and PCI. He will remain on his current medical program. He has had no recent angina.  3. Dyspnea, severe and multifactorial. I don't think there are any real targets for treatment here other than medical therapy for his congestive heart failure. He is in extremely poor physical condition and overall health.  For followup I will see him back in 2 months.  Tonny Bollman 04/24/2013 3:15 PM

## 2013-05-01 ENCOUNTER — Emergency Department (HOSPITAL_COMMUNITY)
Admission: EM | Admit: 2013-05-01 | Discharge: 2013-05-01 | Disposition: A | Discharge status: Home / Self Care | Payer: Medicare Other | Attending: Emergency Medicine | Admitting: Emergency Medicine

## 2013-05-01 ENCOUNTER — Encounter (HOSPITAL_COMMUNITY): Payer: Self-pay | Admitting: Emergency Medicine

## 2013-05-01 ENCOUNTER — Emergency Department (HOSPITAL_COMMUNITY): Payer: Medicare Other

## 2013-05-01 DIAGNOSIS — R609 Edema, unspecified: Secondary | ICD-10-CM

## 2013-05-01 DIAGNOSIS — E7800 Pure hypercholesterolemia, unspecified: Secondary | ICD-10-CM | POA: Insufficient documentation

## 2013-05-01 DIAGNOSIS — Z87448 Personal history of other diseases of urinary system: Secondary | ICD-10-CM | POA: Insufficient documentation

## 2013-05-01 DIAGNOSIS — E669 Obesity, unspecified: Secondary | ICD-10-CM | POA: Insufficient documentation

## 2013-05-01 DIAGNOSIS — Z8719 Personal history of other diseases of the digestive system: Secondary | ICD-10-CM | POA: Insufficient documentation

## 2013-05-01 DIAGNOSIS — I2510 Atherosclerotic heart disease of native coronary artery without angina pectoris: Secondary | ICD-10-CM | POA: Insufficient documentation

## 2013-05-01 DIAGNOSIS — M7989 Other specified soft tissue disorders: Secondary | ICD-10-CM | POA: Insufficient documentation

## 2013-05-01 DIAGNOSIS — Z862 Personal history of diseases of the blood and blood-forming organs and certain disorders involving the immune mechanism: Secondary | ICD-10-CM | POA: Insufficient documentation

## 2013-05-01 DIAGNOSIS — Z951 Presence of aortocoronary bypass graft: Secondary | ICD-10-CM | POA: Insufficient documentation

## 2013-05-01 DIAGNOSIS — Z8673 Personal history of transient ischemic attack (TIA), and cerebral infarction without residual deficits: Secondary | ICD-10-CM | POA: Insufficient documentation

## 2013-05-01 DIAGNOSIS — Z79899 Other long term (current) drug therapy: Secondary | ICD-10-CM | POA: Insufficient documentation

## 2013-05-01 DIAGNOSIS — E109 Type 1 diabetes mellitus without complications: Secondary | ICD-10-CM | POA: Insufficient documentation

## 2013-05-01 DIAGNOSIS — E11359 Type 2 diabetes mellitus with proliferative diabetic retinopathy without macular edema: Secondary | ICD-10-CM | POA: Insufficient documentation

## 2013-05-01 DIAGNOSIS — I252 Old myocardial infarction: Secondary | ICD-10-CM | POA: Insufficient documentation

## 2013-05-01 DIAGNOSIS — Z8679 Personal history of other diseases of the circulatory system: Secondary | ICD-10-CM | POA: Insufficient documentation

## 2013-05-01 DIAGNOSIS — Z7982 Long term (current) use of aspirin: Secondary | ICD-10-CM | POA: Insufficient documentation

## 2013-05-01 DIAGNOSIS — I509 Heart failure, unspecified: Secondary | ICD-10-CM

## 2013-05-01 DIAGNOSIS — I10 Essential (primary) hypertension: Secondary | ICD-10-CM | POA: Insufficient documentation

## 2013-05-01 DIAGNOSIS — Z8739 Personal history of other diseases of the musculoskeletal system and connective tissue: Secondary | ICD-10-CM | POA: Insufficient documentation

## 2013-05-01 DIAGNOSIS — J4489 Other specified chronic obstructive pulmonary disease: Secondary | ICD-10-CM | POA: Insufficient documentation

## 2013-05-01 DIAGNOSIS — J449 Chronic obstructive pulmonary disease, unspecified: Secondary | ICD-10-CM | POA: Insufficient documentation

## 2013-05-01 DIAGNOSIS — Z794 Long term (current) use of insulin: Secondary | ICD-10-CM | POA: Insufficient documentation

## 2013-05-01 DIAGNOSIS — Z8669 Personal history of other diseases of the nervous system and sense organs: Secondary | ICD-10-CM | POA: Insufficient documentation

## 2013-05-01 LAB — URINALYSIS, ROUTINE W REFLEX MICROSCOPIC
Bilirubin Urine: NEGATIVE
Glucose, UA: NEGATIVE mg/dL
Hgb urine dipstick: NEGATIVE
Nitrite: NEGATIVE
Specific Gravity, Urine: 1.004 — ABNORMAL LOW (ref 1.005–1.030)
Urobilinogen, UA: 0.2 mg/dL (ref 0.0–1.0)
pH: 7.5 (ref 5.0–8.0)

## 2013-05-01 LAB — POCT I-STAT TROPONIN I: Troponin i, poc: 0 ng/mL (ref 0.00–0.08)

## 2013-05-01 LAB — CBC WITH DIFFERENTIAL/PLATELET
Basophils Absolute: 0 10*3/uL (ref 0.0–0.1)
Basophils Relative: 0 % (ref 0–1)
Eosinophils Absolute: 0.3 10*3/uL (ref 0.0–0.7)
Eosinophils Relative: 4 % (ref 0–5)
HCT: 33.3 % — ABNORMAL LOW (ref 39.0–52.0)
MCH: 29.6 pg (ref 26.0–34.0)
MCHC: 34.2 g/dL (ref 30.0–36.0)
MCV: 86.5 fL (ref 78.0–100.0)
Monocytes Absolute: 1 10*3/uL (ref 0.1–1.0)
RDW: 14.6 % (ref 11.5–15.5)

## 2013-05-01 LAB — BASIC METABOLIC PANEL
BUN: 25 mg/dL — ABNORMAL HIGH (ref 6–23)
Creatinine, Ser: 1.38 mg/dL — ABNORMAL HIGH (ref 0.50–1.35)
GFR calc Af Amer: 59 mL/min — ABNORMAL LOW (ref >90)
GFR calc non Af Amer: 51 mL/min — ABNORMAL LOW (ref >90)

## 2013-05-01 MED ORDER — FUROSEMIDE 20 MG PO TABS
80.0000 mg | ORAL_TABLET | Freq: Once | ORAL | Status: AC
  Administered 2013-05-01: 80 mg via ORAL
  Filled 2013-05-01: qty 4

## 2013-05-01 MED ORDER — FUROSEMIDE 10 MG/ML IJ SOLN
80.0000 mg | Freq: Once | INTRAMUSCULAR | Status: DC
  Filled 2013-05-01: qty 8

## 2013-05-01 MED ORDER — HYDROCODONE-ACETAMINOPHEN 5-325 MG PO TABS
1.0000 | ORAL_TABLET | Freq: Once | ORAL | Status: AC
  Administered 2013-05-01: 1 via ORAL
  Filled 2013-05-01: qty 1

## 2013-05-01 NOTE — ED Provider Notes (Signed)
CSN: [id number]     Arrival date & time 05/01/13  1133 History   First MD Initiated Contact with Patient 05/01/13 1159     Chief Complaint  Patient presents with  . Shortness of Breath    HPI   Patient was at his ophthalmologist office for procedure this morning he was short of breath. He was referred here for evaluation prior to his procedure. His history of chronic congestive heart failure. He has home health care nursing. He has chronic dependent edema. He states he is compliant with his medications. He sleeps in her recliner and has for years. His legs are normally in a dependent position. He does not elevated temperatures wife's report. He has SCDs/extremity compression devices that he does not wear.. He has had no fever. He's had no cough. He denies chest pain. He has no change in his urinary output  Past Medical History  Diagnosis Date  . CORONARY ARTERY DISEASE 03/11/2007    s/p CABG 1998;  admitted 08/2011 with an inferior STEMI.  LHC 09/07/11: Severe  Three-vessel CAD, LIMA-LAD patent, SVG-RCA chronically occluded, SVG-circumflex chronically occluded, SVG-diagonal 95%.  PCI: Promus DES to the SVG-diagonal.  Procedure c/b transient CHB req CPR and TTVP;  myoview 4/13:large severe inferolateral infarct, no ischemia, EF 25%    . DIABETES MELLITUS, TYPE I 03/11/2007  . HYPERTENSION 03/11/2007  . 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 HYPERCHOLESTEROLEMIA 09/28/2008  . CERVICAL RADICULOPATHY, LEFT 09/23/2008  . PERIPHERAL NEUROPATHY 09/23/2008  . Chronic systolic heart failure 09/23/2008    Echocardiogram 09/07/11: Difficult study, inferior and posterior lateral wall hypokinesis, mild LVH, EF 45%, mild LAE.   [name] RENAL INSUFFICIENCY 03/15/2010  . ED (erectile dysfunction)   . Hypogonadism male   . GI bleed 2012    Multiple Duodenal ulcer   . Obesity   . Legally blind     bilaterally  . Stroke 40    age 33 months; residual right sided weakness  . History of blood transfusion  2012  . Charcot foot due to diabetes mellitus     chronic pain  . Osteoarthritis     right hip and shoulder  . COPD (chronic obstructive pulmonary disease)   . Myocardial infarction 2013b Jan  . PERIPHERAL VASCULAR INSUFFICIENCY,  LEGS, BILATERAL 08/17/2010    ABI's performed 04/19/11 R 0.72 L 0.71  . ANEMIA-IRON DEFICIENCY 09/23/2008    ulcer related last year  . Chronic edema     Chronic pedal edema   Past Surgical History  Procedure Laterality Date  . Carpal tunnel release      left  . Sigmoidoscopy  03/11/2001  . Venous doppler  01/30/2004  . Tonsillectomy and adenoidectomy      "as a kid"  . Cataract extraction w/ intraocular lens  implant, bilateral    . Coronary angioplasty with stent placement  08/2011    "1"  . Retinopathy surgery  2000's    "laser; both eyes"  . Wrist fusion  1976    right  . Anterior fusion cervical spine  2010    "C spine; Dr. Yetta Barre"  . Ankle fracture surgery  1976; ?date    LEFT:  fused; removed bulk of hardware  . Heel spur surgery  ~ 2007    ? left  . Irrigation and debridement knee  02/27/2012    Procedure: IRRIGATION AND DEBRIDEMENT KNEE;  Surgeon: Verlee Rossetti, MD;  Location: [facility] OR;  Service: Orthopedics;  Laterality: Right;  irrigation and drainage right knee  septic bursitis  . Eye surgery  2008    cataract removal, cautery  . Coronary artery bypass graft  1998    CABG X5  . Fracture surgery      foot broken 1976   Family History  Problem Relation Age of Onset  . Heart disease Mother     Coronary Artery Disease  . Heart disease Father     Coronary Artery Disease  . Heart disease Paternal Grandmother     Coronary Artery Disease  . Colon cancer Neg Hx   . Diabetes Other     Grandmother   History  Substance Use Topics  . Smoking status: Never Smoker   . Smokeless tobacco: Never Used  . Alcohol Use: No    Review of Systems  Constitutional: Negative for fever, chills, diaphoresis, appetite change and fatigue.  HENT:  Negative for sore throat, mouth sores and trouble swallowing.   Eyes: Negative for visual disturbance.  Respiratory: Positive for shortness of breath. Negative for cough, chest tightness and wheezing.   Cardiovascular: Positive for leg swelling. Negative for chest pain.  Gastrointestinal: Negative for nausea, vomiting, abdominal pain, diarrhea and abdominal distention.  Endocrine: Negative for polydipsia, polyphagia and polyuria.  Genitourinary: Negative for dysuria, frequency and hematuria.  Musculoskeletal: Negative for gait problem.  Skin: Negative for color change, pallor and rash.  Neurological: Negative for dizziness, syncope, light-headedness and headaches.  Hematological: Does not bruise/bleed easily.  Psychiatric/Behavioral: Negative for behavioral problems and confusion.    Allergies  Sildenafil; Percocet; and Contrast media  Home Medications   Current Outpatient Rx  Name  Route  Sig  Dispense  Refill  . albuterol (PROVENTIL HFA;VENTOLIN HFA) 108 (90 BASE) MCG/ACT inhaler   Inhalation   Inhale 2 puffs into the lungs every 6 (six) hours as needed for wheezing or shortness of breath.          [name] albuterol (PROVENTIL) (2.5 MG/3ML) 0.083% nebulizer solution   Nebulization   Take 2.5 mg by nebulization 2 (two) times daily.          [name] aspirin 81 MG tablet   Oral   Take 81 mg by mouth at bedtime.          [name] atorvastatin (LIPITOR) 40 MG tablet   Oral   Take 40 mg by mouth every evening.         . budesonide (PULMICORT) 0.25 MG/2ML nebulizer solution   Nebulization   Take 0.25 mg by nebulization 2 (two) times daily.         . carvedilol (COREG) 6.25 MG tablet   Oral   Take 6.25 mg by mouth 2 (two) times daily with a meal.         . clopidogrel (PLAVIX) 75 MG tablet   Oral   Take 75 mg by mouth daily.         [name] co-enzyme Q-10 30 MG capsule   Oral   Take 30 mg by mouth daily.         [name] docusate sodium (COLACE) 100 MG capsule   Oral   Take 100 mg by  mouth 2 (two) times daily as needed for constipation.         . gabapentin (NEURONTIN) 600 MG tablet   Oral   Take 600 mg by mouth 3 (three) times daily.         [name] HYDROcodone-acetaminophen (NORCO) 10-325 MG per tablet   Oral   Take 1 tablet by mouth every 4 (four)  hours as needed for pain.   100 tablet   2   . insulin NPH (HUMULIN N,NOVOLIN N) 100 UNIT/ML injection   Subcutaneous   Inject 70 Units into the skin daily before breakfast.         . ipratropium (ATROVENT) 0.02 % nebulizer solution   Nebulization   Take 500 mcg by nebulization 2 (two) times daily.         . isosorbide mononitrate (IMDUR) 30 MG 24 hr tablet   Oral   Take 30 mg by mouth daily.         [name] losartan (COZAAR) 50 MG tablet   Oral   Take 50 mg by mouth every morning.          . nitroGLYCERIN (NITROSTAT) 0.4 MG SL tablet   Sublingual   Place 1 tablet (0.4 mg total) under the tongue every 5 (five) minutes as needed for chest pain.   25 tablet   5   . pantoprazole (PROTONIX) 40 MG tablet   Oral   Take 40 mg by mouth daily.          . polyethylene glycol (MIRALAX / GLYCOLAX) packet   Oral   Take 17 g by mouth 2 (two) times daily.   30 each   0   . potassium chloride SA (K-DUR,KLOR-CON) 20 MEQ tablet   Oral   Take 20 mEq by mouth 2 (two) times daily.         . silodosin (RAPAFLO) 8 MG CAPS capsule   Oral   Take 8 mg by mouth every evening.          [name] spironolactone (ALDACTONE) 25 MG tablet   Oral   Take 12.5 mg by mouth daily.         [name] torsemide (DEMADEX) 20 MG tablet   Oral   Take 60 mg by mouth 2 (two) times daily.          BP 113/44  Pulse 61  Temp(Src) 98.2 F (36.8 C) (Oral)  Resp 18  SpO2 100% Physical Exam  Constitutional: He is oriented to person, place, and time. He appears well-developed and well-nourished. No distress.  Is an obese male who is dyspneic with any exertion to and from the bed and transferring to and from a chair. This is normal for him  he states  HENT:  Head: Normocephalic.  Eyes: Conjunctivae are normal. Pupils are equal, round, and reactive to light. No scleral icterus.  Neck: Normal range of motion. Neck supple. No thyromegaly present.  Cardiovascular: Normal rate and regular rhythm.  Exam reveals no gallop and no friction rub.   No murmur heard. No gallop. Sinus rhythm on the monitor without ectopy.  Pulmonary/Chest: Effort normal and breath sounds normal. No respiratory distress. He has no wheezes. He has no rales.  Clear lungs. No crackles or rales.  Abdominal: Soft. Bowel sounds are normal. He exhibits no distension. There is no tenderness. There is no rebound.  Musculoskeletal: Normal range of motion.  Neurological: He is alert and oriented to person, place, and time.  Skin: Skin is warm and dry. No rash noted.  Plus symmetric bilateral extremity edema. He has a wound of entry aspect of his right lower leg that is healing. He states this is worse and tape was pulled out by his home health care nurse. Legs erythema to them but they're not warm to touch. They're not painful.  Psychiatric: He has a normal mood and affect. His behavior  is normal.    ED Course  Procedures (including critical care time) Labs Review Labs Reviewed  CBC WITH DIFFERENTIAL - Abnormal; Notable for the following:    RBC 3.85 (*)    Hemoglobin 11.4 (*)    HCT 33.3 (*)    Monocytes Relative 15 (*)    All other components within normal limits  PRO B NATRIURETIC PEPTIDE - Abnormal; Notable for the following:    Pro B Natriuretic peptide (BNP) 522.0 (*)    All other components within normal limits  BASIC METABOLIC PANEL - Abnormal; Notable for the following:    Sodium 132 (*)    BUN 25 (*)    Creatinine, Ser 1.38 (*)    GFR calc non Af Amer 51 (*)    GFR calc Af Amer 59 (*)    All other components within normal limits  URINALYSIS, ROUTINE W REFLEX MICROSCOPIC - Abnormal; Notable for the following:    APPearance CLOUDY (*)    Specific  Gravity, Urine 1.004 (*)    All other components within normal limits  POCT I-STAT TROPONIN I   Imaging Review Dg Chest 2 View  05/01/2013   CLINICAL DATA:  Shortness of breath, leg swelling  EXAM: CHEST  2 VIEW  COMPARISON:  04/16/2013  FINDINGS: Cardiomegaly again noted. Status post CABG. Central mild vascular congestion without pulmonary edema. Mild linear atelectasis or scarring in the lingula. Mild degenerative changes thoracic spine. No segmental infiltrate.  IMPRESSION: Status post CABG. Central mild vascular congestion without pulmonary edema. Mild linear atelectasis or scarring in the lingula. Mild degenerative changes thoracic spine. No segmental infiltrate.   Electronically Signed   By: Natasha Mead   On: 05/01/2013 14:40   EKG: Indication dyspnea. Interpretation sinus based rhythm. Right bundle branch block pattern unchanged from morphology versus September of 14.  MDM   1. CHF (congestive heart failure)   2. Edema    His chest x-ray shows minimal amount of congestion without frank pulmonary edema. He  is in a state of chronic compensated congestive heart failure. His kidney function is intact. We'll give him an extra dose of Lasix today he'll increase his Demadex by one dose per day for the next few days. I do not feel he needs additional hospital for any acute events. No elevation of the troponin. Normal EKG and no frank fluid overload on the chest x-ray.  Dressings were applied to his lower legs with Ace wrap from his toes to his knees. Asked him to get his compression devices out of the box,  put him on his legs, and use them. Continue to followup with primary care physician and cardiologist    Roney Marion, MD 05/01/13 [phone]

## 2013-05-01 NOTE — ED Notes (Signed)
Patient transported to X-ray 

## 2013-05-01 NOTE — ED Notes (Addendum)
Pt in W/C and absolutely had to void before he could come back to have ekg,Went to have eye surgery today at dr Luciana Axe office and pt reported that he felt like he was suffocating and voiding a lot  X 2 days so he was sent to er for further tests

## 2013-05-05 ENCOUNTER — Telehealth: Payer: Self-pay

## 2013-05-05 NOTE — Telephone Encounter (Signed)
Mark French with [facility] left voicemail stating pt was seen on Friday with open leg wounds, cx came back positive for staph can they prescribe for pt Cipro 500mg  bid, [phone]([facility] for lab)

## 2013-05-05 NOTE — Telephone Encounter (Signed)
Mark French advised and states an understanding and reuqested pt's labs to be faxed to [facility]

## 2013-05-05 NOTE — Telephone Encounter (Signed)
Ok, but levaquin is prob better.  Due to renal insuff, he needs 1/2 dosage.

## 2013-05-07 ENCOUNTER — Emergency Department (HOSPITAL_COMMUNITY): Payer: Medicare Other

## 2013-05-07 ENCOUNTER — Encounter (HOSPITAL_COMMUNITY): Payer: Self-pay | Admitting: Adult Health

## 2013-05-07 ENCOUNTER — Emergency Department (HOSPITAL_COMMUNITY)
Admission: EM | Admit: 2013-05-07 | Discharge: 2013-05-07 | Disposition: A | Discharge status: Home / Self Care | Payer: Medicare Other | Attending: Emergency Medicine | Admitting: Emergency Medicine

## 2013-05-07 DIAGNOSIS — I5022 Chronic systolic (congestive) heart failure: Secondary | ICD-10-CM | POA: Insufficient documentation

## 2013-05-07 DIAGNOSIS — Z792 Long term (current) use of antibiotics: Secondary | ICD-10-CM | POA: Insufficient documentation

## 2013-05-07 DIAGNOSIS — M169 Osteoarthritis of hip, unspecified: Secondary | ICD-10-CM | POA: Insufficient documentation

## 2013-05-07 DIAGNOSIS — Z79899 Other long term (current) drug therapy: Secondary | ICD-10-CM | POA: Insufficient documentation

## 2013-05-07 DIAGNOSIS — I252 Old myocardial infarction: Secondary | ICD-10-CM | POA: Insufficient documentation

## 2013-05-07 DIAGNOSIS — M19019 Primary osteoarthritis, unspecified shoulder: Secondary | ICD-10-CM | POA: Insufficient documentation

## 2013-05-07 DIAGNOSIS — Z7902 Long term (current) use of antithrombotics/antiplatelets: Secondary | ICD-10-CM | POA: Insufficient documentation

## 2013-05-07 DIAGNOSIS — J449 Chronic obstructive pulmonary disease, unspecified: Secondary | ICD-10-CM | POA: Insufficient documentation

## 2013-05-07 DIAGNOSIS — Z794 Long term (current) use of insulin: Secondary | ICD-10-CM | POA: Insufficient documentation

## 2013-05-07 DIAGNOSIS — E1039 Type 1 diabetes mellitus with other diabetic ophthalmic complication: Secondary | ICD-10-CM | POA: Insufficient documentation

## 2013-05-07 DIAGNOSIS — E11359 Type 2 diabetes mellitus with proliferative diabetic retinopathy without macular edema: Secondary | ICD-10-CM | POA: Insufficient documentation

## 2013-05-07 DIAGNOSIS — E669 Obesity, unspecified: Secondary | ICD-10-CM | POA: Insufficient documentation

## 2013-05-07 DIAGNOSIS — R109 Unspecified abdominal pain: Secondary | ICD-10-CM | POA: Insufficient documentation

## 2013-05-07 DIAGNOSIS — Z8719 Personal history of other diseases of the digestive system: Secondary | ICD-10-CM | POA: Insufficient documentation

## 2013-05-07 DIAGNOSIS — E7800 Pure hypercholesterolemia, unspecified: Secondary | ICD-10-CM | POA: Insufficient documentation

## 2013-05-07 DIAGNOSIS — Z9189 Other specified personal risk factors, not elsewhere classified: Secondary | ICD-10-CM | POA: Insufficient documentation

## 2013-05-07 DIAGNOSIS — J4489 Other specified chronic obstructive pulmonary disease: Secondary | ICD-10-CM | POA: Insufficient documentation

## 2013-05-07 DIAGNOSIS — I2510 Atherosclerotic heart disease of native coronary artery without angina pectoris: Secondary | ICD-10-CM | POA: Insufficient documentation

## 2013-05-07 DIAGNOSIS — Z87448 Personal history of other diseases of urinary system: Secondary | ICD-10-CM | POA: Insufficient documentation

## 2013-05-07 DIAGNOSIS — Z951 Presence of aortocoronary bypass graft: Secondary | ICD-10-CM | POA: Insufficient documentation

## 2013-05-07 DIAGNOSIS — Z8673 Personal history of transient ischemic attack (TIA), and cerebral infarction without residual deficits: Secondary | ICD-10-CM | POA: Insufficient documentation

## 2013-05-07 DIAGNOSIS — I10 Essential (primary) hypertension: Secondary | ICD-10-CM | POA: Insufficient documentation

## 2013-05-07 DIAGNOSIS — H548 Legal blindness, as defined in USA: Secondary | ICD-10-CM | POA: Insufficient documentation

## 2013-05-07 DIAGNOSIS — Z862 Personal history of diseases of the blood and blood-forming organs and certain disorders involving the immune mechanism: Secondary | ICD-10-CM | POA: Insufficient documentation

## 2013-05-07 DIAGNOSIS — M1610 Unilateral primary osteoarthritis, unspecified hip: Secondary | ICD-10-CM | POA: Insufficient documentation

## 2013-05-07 LAB — CBC WITH DIFFERENTIAL/PLATELET
Basophils Absolute: 0 10*3/uL (ref 0.0–0.1)
Eosinophils Absolute: 0.3 10*3/uL (ref 0.0–0.7)
Eosinophils Relative: 4 % (ref 0–5)
HCT: 36 % — ABNORMAL LOW (ref 39.0–52.0)
Hemoglobin: 12.3 g/dL — ABNORMAL LOW (ref 13.0–17.0)
Lymphocytes Relative: 17 % (ref 12–46)
Lymphs Abs: 1.2 10*3/uL (ref 0.7–4.0)
MCH: 29.5 pg (ref 26.0–34.0)
MCV: 86.3 fL (ref 78.0–100.0)
Monocytes Absolute: 0.8 10*3/uL (ref 0.1–1.0)
Monocytes Relative: 12 % (ref 3–12)
Neutro Abs: 4.8 10*3/uL (ref 1.7–7.7)
RDW: 14.3 % (ref 11.5–15.5)
WBC: 7.1 10*3/uL (ref 4.0–10.5)

## 2013-05-07 LAB — COMPREHENSIVE METABOLIC PANEL
AST: 16 U/L (ref 0–37)
Albumin: 3.5 g/dL (ref 3.5–5.2)
Alkaline Phosphatase: 94 U/L (ref 39–117)
BUN: 25 mg/dL — ABNORMAL HIGH (ref 6–23)
Chloride: 92 mEq/L — ABNORMAL LOW (ref 96–112)
Potassium: 3.9 mEq/L (ref 3.5–5.1)
Sodium: 131 mEq/L — ABNORMAL LOW (ref 135–145)
Total Bilirubin: 0.4 mg/dL (ref 0.3–1.2)
Total Protein: 7.4 g/dL (ref 6.0–8.3)

## 2013-05-07 LAB — LIPASE, BLOOD: Lipase: 22 U/L (ref 11–59)

## 2013-05-07 NOTE — ED Provider Notes (Signed)
CSN: [id number]     Arrival date & time 05/07/13  1191 History   First MD Initiated Contact with Patient 05/07/13 0410     Chief Complaint  Patient presents with  . Abdominal Pain   (Consider location/radiation/quality/duration/timing/severity/associated sxs/prior Treatment) HPI Comments: Mark French is a 67 y.o. Male who presents for evaluation of ongoing abdominal pain, for 2 weeks, that is worse with eating. The pain is crampy in nature. He has had decreased stooling and decreased urinary output that he believes is related to decreased oral intake. He denies fever, abdominal distention, dysuria, cough, shortness of breath, or chest pain. He is evaluated here in the emergency Department 6 days ago for dyspnea. He received short-term increase of his Lasix dosing with improvement. He does not have chronic abdominal pain. He has not been weighing himself because of his leg pain. He is currently being treated with Levaquin, for a right lower leg infection, diagnosed as "staph". There are no other known modifying factors.   Patient is a 67 y.o. male presenting with abdominal pain. The history is provided by the patient.  Abdominal Pain   Past Medical History  Diagnosis Date  . CORONARY ARTERY DISEASE 03/11/2007    s/p CABG 1998;  admitted 08/2011 with an inferior STEMI.  LHC 09/07/11: Severe  Three-vessel CAD, LIMA-LAD patent, SVG-RCA chronically occluded, SVG-circumflex chronically occluded, SVG-diagonal 95%.  PCI: Promus DES to the SVG-diagonal.  Procedure c/b transient CHB req CPR and TTVP;  myoview 4/13:large severe inferolateral infarct, no ischemia, EF 25%    . DIABETES MELLITUS, TYPE I 03/11/2007  . HYPERTENSION 03/11/2007  . 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 HYPERCHOLESTEROLEMIA 09/28/2008  . CERVICAL RADICULOPATHY, LEFT 09/23/2008  . PERIPHERAL NEUROPATHY 09/23/2008  . Chronic systolic heart failure 09/23/2008    Echocardiogram 09/07/11: Difficult study, inferior and  posterior lateral wall hypokinesis, mild LVH, EF 45%, mild LAE.   [name] RENAL INSUFFICIENCY 03/15/2010  . ED (erectile dysfunction)   . Hypogonadism male   . GI bleed 2012    Multiple Duodenal ulcer   . Obesity   . Legally blind     bilaterally  . Stroke 40    age 79 months; residual right sided weakness  . History of blood transfusion 2012  . Charcot foot due to diabetes mellitus     chronic pain  . Osteoarthritis     right hip and shoulder  . COPD (chronic obstructive pulmonary disease)   . Myocardial infarction 2013b Jan  . PERIPHERAL VASCULAR INSUFFICIENCY,  LEGS, BILATERAL 08/17/2010    ABI's performed 04/19/11 R 0.72 L 0.71  . ANEMIA-IRON DEFICIENCY 09/23/2008    ulcer related last year  . Chronic edema     Chronic pedal edema   Past Surgical History  Procedure Laterality Date  . Carpal tunnel release      left  . Sigmoidoscopy  03/11/2001  . Venous doppler  01/30/2004  . Tonsillectomy and adenoidectomy      "as a kid"  . Cataract extraction w/ intraocular lens  implant, bilateral    . Coronary angioplasty with stent placement  08/2011    "1"  . Retinopathy surgery  2000's    "laser; both eyes"  . Wrist fusion  1976    right  . Anterior fusion cervical spine  2010    "C spine; Dr. Yetta Barre"  . Ankle fracture surgery  1976; ?date    LEFT:  fused; removed bulk of hardware  . Heel spur surgery  ~ 2007    ?  left  . Irrigation and debridement knee  02/27/2012    Procedure: IRRIGATION AND DEBRIDEMENT KNEE;  Surgeon: Verlee Rossetti, MD;  Location: [facility] OR;  Service: Orthopedics;  Laterality: Right;  irrigation and drainage right knee septic bursitis  . Eye surgery  2008    cataract removal, cautery  . Coronary artery bypass graft  1998    CABG X5  . Fracture surgery      foot broken 1976   Family History  Problem Relation Age of Onset  . Heart disease Mother     Coronary Artery Disease  . Heart disease Father     Coronary Artery Disease  . Heart disease Paternal  Grandmother     Coronary Artery Disease  . Colon cancer Neg Hx   . Diabetes Other     Grandmother   History  Substance Use Topics  . Smoking status: Never Smoker   . Smokeless tobacco: Never Used  . Alcohol Use: No    Review of Systems  Gastrointestinal: Positive for abdominal pain.  All other systems reviewed and are negative.    Allergies  Sildenafil; Percocet; and Contrast media  Home Medications   Current Outpatient Rx  Name  Route  Sig  Dispense  Refill  . albuterol (PROVENTIL HFA;VENTOLIN HFA) 108 (90 BASE) MCG/ACT inhaler   Inhalation   Inhale 2 puffs into the lungs every 6 (six) hours as needed for wheezing or shortness of breath.          [name] albuterol (PROVENTIL) (2.5 MG/3ML) 0.083% nebulizer solution   Nebulization   Take 2.5 mg by nebulization 2 (two) times daily.          [name] aspirin 81 MG tablet   Oral   Take 81 mg by mouth at bedtime.          [name] atorvastatin (LIPITOR) 40 MG tablet   Oral   Take 40 mg by mouth every evening.         . budesonide (PULMICORT) 0.25 MG/2ML nebulizer solution   Nebulization   Take 0.25 mg by nebulization 2 (two) times daily.         . carvedilol (COREG) 6.25 MG tablet   Oral   Take 6.25 mg by mouth 2 (two) times daily with a meal.         . clopidogrel (PLAVIX) 75 MG tablet   Oral   Take 75 mg by mouth daily.         [name] co-enzyme Q-10 30 MG capsule   Oral   Take 30 mg by mouth daily.         [name] docusate sodium (COLACE) 100 MG capsule   Oral   Take 100 mg by mouth 2 (two) times daily as needed for constipation.         . gabapentin (NEURONTIN) 600 MG tablet   Oral   Take 600 mg by mouth 3 (three) times daily.         [name] HYDROcodone-acetaminophen (NORCO) 10-325 MG per tablet   Oral   Take 1 tablet by mouth every 4 (four) hours as needed for pain.   100 tablet   2   . insulin NPH (HUMULIN N,NOVOLIN N) 100 UNIT/ML injection   Subcutaneous   Inject 70 Units into the skin daily before  breakfast.         . ipratropium (ATROVENT) 0.02 % nebulizer solution   Nebulization   Take 500 mcg by nebulization 2 (two) times daily.         [name]  isosorbide mononitrate (IMDUR) 30 MG 24 hr tablet   Oral   Take 30 mg by mouth daily.         [name] levofloxacin (LEVAQUIN) 250 MG tablet   Oral   Take 250 mg by mouth daily.          [name] losartan (COZAAR) 50 MG tablet   Oral   Take 50 mg by mouth every morning.          . nitroGLYCERIN (NITROSTAT) 0.4 MG SL tablet   Sublingual   Place 1 tablet (0.4 mg total) under the tongue every 5 (five) minutes as needed for chest pain.   25 tablet   5   . pantoprazole (PROTONIX) 40 MG tablet   Oral   Take 40 mg by mouth daily.          . polyethylene glycol (MIRALAX / GLYCOLAX) packet   Oral   Take 17 g by mouth 2 (two) times daily.   30 each   0   . potassium chloride SA (K-DUR,KLOR-CON) 20 MEQ tablet   Oral   Take 20 mEq by mouth 2 (two) times daily.         . silodosin (RAPAFLO) 8 MG CAPS capsule   Oral   Take 8 mg by mouth every evening.          [name] spironolactone (ALDACTONE) 25 MG tablet   Oral   Take 12.5 mg by mouth daily.         [name] torsemide (DEMADEX) 20 MG tablet   Oral   Take 60 mg by mouth 2 (two) times daily.          BP 145/67  Pulse 65  Temp(Src) 97.7 F (36.5 C) (Oral)  Resp 14  SpO2 100% Physical Exam  Nursing note and vitals reviewed. Constitutional: He is oriented to person, place, and time. He appears well-developed.  OB  HENT:  Head: Normocephalic and atraumatic.  Right Ear: External ear normal.  Left Ear: External ear normal.  Eyes: Conjunctivae and EOM are normal. Pupils are equal, round, and reactive to light.  Neck: Normal range of motion and phonation normal. Neck supple.  Cardiovascular: Normal rate, regular rhythm, normal heart sounds and intact distal pulses.   Pulmonary/Chest: Effort normal and breath sounds normal. He exhibits no bony tenderness.  Abdominal: Soft. Normal  appearance. There is no tenderness.  Musculoskeletal: Normal range of motion. He exhibits edema (bilateral lower legs).  Neurological: He is alert and oriented to person, place, and time. No cranial nerve deficit or sensory deficit. He exhibits normal muscle tone. Coordination normal.  Skin: Skin is warm, dry and intact.  Lower legs are reddened bilaterally.  Right mid shin with a local area of drainage, consistent with infection; no associated fluctuance  Psychiatric: He has a normal mood and affect. His behavior is normal. Judgment and thought content normal.    ED Course  Procedures (including critical care time)  Medications - No data to display   Patient Vitals for the past 24 hrs:  BP Temp Temp src Pulse Resp SpO2  05/07/13 0715 145/67 mmHg - - 65 14 100 %  05/07/13 0404 126/53 mmHg 97.7 F (36.5 C) Oral 59 17 100 %    CT ordered to evaluate for acute abdominal pathology     Date: 05/07/13  Rate: 61  Rhythm: normal sinus rhythm  QRS Axis: left  PR and QT Intervals: PR prolonged  ST/T Wave abnormalities: normal  PR and QRS  Conduction Disutrbances:right bundle branch block  Narrative Interpretation:   Old EKG Reviewed: unchanged- 05/01/13    Labs Review Labs Reviewed  COMPREHENSIVE METABOLIC PANEL - Abnormal; Notable for the following:    Sodium 131 (*)    Chloride 92 (*)    BUN 25 (*)    Creatinine, Ser 1.39 (*)    GFR calc non Af Amer 50 (*)    GFR calc Af Amer 58 (*)    All other components within normal limits  CBC WITH DIFFERENTIAL - Abnormal; Notable for the following:    RBC 4.17 (*)    Hemoglobin 12.3 (*)    HCT 36.0 (*)    All other components within normal limits  LIPASE, BLOOD  POCT I-STAT TROPONIN I   Imaging Review No results found.  MDM   1. Abdominal pain    Nonspecific abdominal pain, subacute, with decreased oral intake. CT imaging, ordered to evaluate for un-suspected intra-abdominal infection.   Nursing Notes Reviewed/ Care  Coordinated, and agree without changes. Applicable Imaging Reviewed.  Interpretation of Laboratory Data incorporated into ED treatment  Care to Dr. Denton Lank- [phone]    Flint Melter, MD 05/07/13 1056

## 2013-05-07 NOTE — ED Notes (Signed)
Presents with bilateral upper abdominal pain ongoing for 2 weeks associated with worse pain after eating, pain is described as fullness, pain does not radiate, pt denies nausea, denies SOB.

## 2013-05-07 NOTE — ED Notes (Signed)
CT paged and notified pt has finished both containers of barium contrast.

## 2013-05-07 NOTE — ED Notes (Signed)
Family at bedside. 

## 2013-05-07 NOTE — ED Notes (Signed)
Pt started on first cup of barium.

## 2013-05-07 NOTE — ED Notes (Signed)
CT called in regards to CT order.

## 2013-05-07 NOTE — ED Provider Notes (Signed)
Signed out by Dr Effie Shy, to d/c pt to home if/when ct abd negative.  Ct abd/pelvis neg for acute process.   Recheck pt abd soft nt.  Denies nv. No cp or sob. No fever.   Discussed ct w pt and family.  Pt appears stable for d/c, rec close pcp follow up.    Suzi Roots, MD 05/07/13 [phone]

## 2013-05-08 ENCOUNTER — Telehealth: Payer: Self-pay

## 2013-05-08 NOTE — Telephone Encounter (Signed)
HJeather with [facility] called needs order for wound care and visit orders, pt had 4 open places on  RLE, one area is draining [phone]

## 2013-05-08 NOTE — Telephone Encounter (Signed)
Called lvm on [name]'s cell phone, [phone] to call in the morning to schedule an OV with Dr Everardo All. Be advised.

## 2013-05-08 NOTE — Telephone Encounter (Signed)
Ov here tomorrow

## 2013-05-12 ENCOUNTER — Other Ambulatory Visit: Payer: Self-pay | Admitting: Endocrinology

## 2013-05-12 ENCOUNTER — Telehealth: Payer: Self-pay

## 2013-05-12 NOTE — Telephone Encounter (Signed)
[name] with [facility] [phone], left voicemail requesting, nursing order and pt order for bilateral leg wounds

## 2013-05-12 NOTE — Telephone Encounter (Signed)
Ov needed.  We'll address then 

## 2013-05-12 NOTE — Telephone Encounter (Signed)
Left message

## 2013-05-13 ENCOUNTER — Other Ambulatory Visit: Payer: Self-pay | Admitting: *Deleted

## 2013-05-13 MED ORDER — PANTOPRAZOLE SODIUM 40 MG PO TBEC: 40.0000 mg | DELAYED_RELEASE_TABLET | Freq: Every day | ORAL | Status: DC

## 2013-05-13 MED ORDER — GABAPENTIN 600 MG PO TABS: 600.0000 mg | ORAL_TABLET | Freq: Three times a day (TID) | ORAL | Status: DC

## 2013-05-13 NOTE — Telephone Encounter (Signed)
Left message pt needs an ov

## 2013-05-16 ENCOUNTER — Encounter: Payer: Self-pay | Admitting: Endocrinology

## 2013-05-16 ENCOUNTER — Ambulatory Visit (INDEPENDENT_AMBULATORY_CARE_PROVIDER_SITE_OTHER): Payer: Medicare Other | Admitting: Endocrinology

## 2013-05-16 VITALS — BP 126/80 | HR 80

## 2013-05-16 DIAGNOSIS — IMO0001 Reserved for inherently not codable concepts without codable children: Secondary | ICD-10-CM

## 2013-05-16 DIAGNOSIS — R269 Unspecified abnormalities of gait and mobility: Secondary | ICD-10-CM

## 2013-05-16 DIAGNOSIS — I69998 Other sequelae following unspecified cerebrovascular disease: Secondary | ICD-10-CM

## 2013-05-16 DIAGNOSIS — E1049 Type 1 diabetes mellitus with other diabetic neurological complication: Secondary | ICD-10-CM

## 2013-05-16 DIAGNOSIS — R55 Syncope and collapse: Secondary | ICD-10-CM

## 2013-05-16 MED ORDER — LACTULOSE 10 GM/15ML PO SOLN: 30.0000 g | Freq: Two times a day (BID) | ORAL | Status: DC

## 2013-05-16 MED ORDER — "CURITY NON-ADHERENT STRIPS 3""X3"" PADS": 1.0000 | MEDICATED_PAD | Freq: Two times a day (BID) | Status: DC

## 2013-05-16 NOTE — Progress Notes (Signed)
Subjective:    Patient ID: Mark French, male    DOB: Aug 11, 1945, 67 y.o.   MRN: [id number]  HPI The state of at least three ongoing medical problems is addressed today, with interval history of each noted here: Pt returns for f/u of insulin-requiring DM (dx'ed 1995; complicated by CAD, renal insufficiency, peripheral sensory neuropathy and retinopathy; therapy has been limited by pt's need for a simple, inexpensive insulin regimen; he has never had severe hypoglycemia or DKA).  He denies hypoglycemia.  Wife states cbg's are well-controlled.   Cellulitis: left leg erythema is much better. Pt was seen at ER forsays abd pain: since then, it is improved, but persists.  His appetite is decreased.   Past Medical History  Diagnosis Date  . CORONARY ARTERY DISEASE 03/11/2007    s/p CABG 1998;  admitted 08/2011 with an inferior STEMI.  LHC 09/07/11: Severe  Three-vessel CAD, LIMA-LAD patent, SVG-RCA chronically occluded, SVG-circumflex chronically occluded, SVG-diagonal 95%.  PCI: Promus DES to the SVG-diagonal.  Procedure c/b transient CHB req CPR and TTVP;  myoview 4/13:large severe inferolateral infarct, no ischemia, EF 25%    . DIABETES MELLITUS, TYPE I 03/11/2007  . HYPERTENSION 03/11/2007  . 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 HYPERCHOLESTEROLEMIA 09/28/2008  . CERVICAL RADICULOPATHY, LEFT 09/23/2008  . PERIPHERAL NEUROPATHY 09/23/2008  . Chronic systolic heart failure 09/23/2008    Echocardiogram 09/07/11: Difficult study, inferior and posterior lateral wall hypokinesis, mild LVH, EF 45%, mild LAE.   [name] RENAL INSUFFICIENCY 03/15/2010  . ED (erectile dysfunction)   . Hypogonadism male   . GI bleed 2012    Multiple Duodenal ulcer   . Obesity   . Legally blind     bilaterally  . Stroke 23    age 71 months; residual right sided weakness  . History of blood transfusion 2012  . Charcot foot due to diabetes mellitus     chronic pain  . Osteoarthritis     right hip and shoulder  .  COPD (chronic obstructive pulmonary disease)   . Myocardial infarction 2013b Jan  . PERIPHERAL VASCULAR INSUFFICIENCY,  LEGS, BILATERAL 08/17/2010    ABI's performed 04/19/11 R 0.72 L 0.71  . ANEMIA-IRON DEFICIENCY 09/23/2008    ulcer related last year  . Chronic edema     Chronic pedal edema    Past Surgical History  Procedure Laterality Date  . Carpal tunnel release      left  . Sigmoidoscopy  03/11/2001  . Venous doppler  01/30/2004  . Tonsillectomy and adenoidectomy      "as a kid"  . Cataract extraction w/ intraocular lens  implant, bilateral    . Coronary angioplasty with stent placement  08/2011    "1"  . Retinopathy surgery  2000's    "laser; both eyes"  . Wrist fusion  1976    right  . Anterior fusion cervical spine  2010    "C spine; Dr. Yetta Barre"  . Ankle fracture surgery  1976; ?date    LEFT:  fused; removed bulk of hardware  . Heel spur surgery  ~ 2007    ? left  . Irrigation and debridement knee  02/27/2012    Procedure: IRRIGATION AND DEBRIDEMENT KNEE;  Surgeon: Verlee Rossetti, MD;  Location: [facility] OR;  Service: Orthopedics;  Laterality: Right;  irrigation and drainage right knee septic bursitis  . Eye surgery  2008    cataract removal, cautery  . Coronary artery bypass graft  1998    CABG X5  .  Fracture surgery      foot broken 1976    History   Social History  . Marital Status: Married    Spouse Name: N/A    Number of Children: 0  . Years of Education: N/A   Occupational History  . Disabled    Social History Main Topics  . Smoking status: Never Smoker   . Smokeless tobacco: Never Used  . Alcohol Use: No  . Drug Use: No  . Sexual Activity: No   Other Topics Concern  . Not on file   Social History Narrative   Comes to appointments with wife      0 Caffeine drinks daily     Current Outpatient Prescriptions on File Prior to Visit  Medication Sig Dispense Refill  . albuterol (PROVENTIL HFA;VENTOLIN HFA) 108 (90 BASE) MCG/ACT inhaler Inhale 2  puffs into the lungs every 6 (six) hours as needed for wheezing or shortness of breath.       [name] albuterol (PROVENTIL) (2.5 MG/3ML) 0.083% nebulizer solution Take 2.5 mg by nebulization 2 (two) times daily.       [name] aspirin 81 MG tablet Take 81 mg by mouth at bedtime.       [name] atorvastatin (LIPITOR) 40 MG tablet Take 40 mg by mouth every evening.      . budesonide (PULMICORT) 0.25 MG/2ML nebulizer solution Take 0.25 mg by nebulization 2 (two) times daily.      . carvedilol (COREG) 6.25 MG tablet Take 6.25 mg by mouth 2 (two) times daily with a meal.      . clopidogrel (PLAVIX) 75 MG tablet Take 75 mg by mouth daily.      [name] co-enzyme Q-10 30 MG capsule Take 30 mg by mouth daily.      [name] docusate sodium (COLACE) 100 MG capsule Take 100 mg by mouth 2 (two) times daily as needed for constipation.      . gabapentin (NEURONTIN) 600 MG tablet TAKE 1 TABLET BY MOUTH THREE TIMES DAILY.  90 tablet  0  . gabapentin (NEURONTIN) 600 MG tablet Take 1 tablet (600 mg total) by mouth 3 (three) times daily.  90 tablet  3  . HYDROcodone-acetaminophen (NORCO) 10-325 MG per tablet Take 1 tablet by mouth every 4 (four) hours as needed for pain.  100 tablet  2  . insulin NPH (HUMULIN N,NOVOLIN N) 100 UNIT/ML injection Inject 55 Units into the skin daily before breakfast.       . ipratropium (ATROVENT) 0.02 % nebulizer solution Take 500 mcg by nebulization 2 (two) times daily.      . isosorbide mononitrate (IMDUR) 30 MG 24 hr tablet Take 30 mg by mouth daily.      [name] levofloxacin (LEVAQUIN) 250 MG tablet Take 250 mg by mouth daily.       [name] losartan (COZAAR) 50 MG tablet Take 50 mg by mouth every morning.       . nitroGLYCERIN (NITROSTAT) 0.4 MG SL tablet Place 1 tablet (0.4 mg total) under the tongue every 5 (five) minutes as needed for chest pain.  25 tablet  5  . pantoprazole (PROTONIX) 40 MG tablet TAKE 1 TABLET BY MOUTH DAILY  30 tablet  0  . pantoprazole (PROTONIX) 40 MG tablet Take 1 tablet (40 mg total) by mouth daily.   30 tablet  3  . polyethylene glycol (MIRALAX / GLYCOLAX) packet Take 17 g by mouth 2 (two) times daily.  30 each  0  . potassium chloride SA (K-DUR,KLOR-CON)  20 MEQ tablet Take 20 mEq by mouth 2 (two) times daily.      . silodosin (RAPAFLO) 8 MG CAPS capsule Take 8 mg by mouth every evening.       [name] spironolactone (ALDACTONE) 25 MG tablet Take 12.5 mg by mouth daily.      [name] torsemide (DEMADEX) 20 MG tablet Take 60 mg by mouth 2 (two) times daily.       No current facility-administered medications on file prior to visit.    Allergies  Allergen Reactions  . Sildenafil Nausea Only and Other (See Comments)    "took it once; heart went to pieces; never took it again"  . Percocet [Oxycodone-Acetaminophen] Other (See Comments)    Unknown reaction-possible altered mental state-per wife.    . Contrast Media [Iodinated Diagnostic Agents] Nausea Only    Family History  Problem Relation Age of Onset  . Heart disease Mother     Coronary Artery Disease  . Heart disease Father     Coronary Artery Disease  . Heart disease Paternal Grandmother     Coronary Artery Disease  . Colon cancer Neg Hx   . Diabetes Other     Grandmother   BP 126/80  Pulse 80  SpO2 93%  Review of Systems He has lost weight.  Denies brbpr.      Objective:   Physical Exam VITAL SIGNS:  See vs page GENERAL: no distress Legs: chronic violaceous discoloration is still there, but no erythema.     Lab Results  Component Value Date   HGBA1C 6.4 05/16/2013  (i reviewed ct results)    Assessment & Plan:  abd pain, persistent, uncertain etiology DM: overcontrolled due to weight loss Cellulitis, improved.

## 2013-05-16 NOTE — Patient Instructions (Addendum)
i have sent a prescription to your pharmacy, for the bowels. I hope you feel better soon.  If you don't feel better by next week, please call back.  Please call sooner if you get worse. Please come back for a medicare wellness appointment in 3 months. blood tests are being requested for you today.  We'll contact you with results. check your blood sugar twice a day.  vary the time of day when you check, between before the 3 meals, and at bedtime.  also check if you have symptoms of your blood sugar being too high or too low.  please keep a record of the readings and bring it to your next appointment here.  You can write it on any piece of paper.  please call us sooner if your blood sugar goes below 70, or if you have a lot of readings over 200.

## 2013-05-22 ENCOUNTER — Other Ambulatory Visit: Payer: Self-pay | Admitting: Endocrinology

## 2013-05-22 ENCOUNTER — Telehealth: Payer: Self-pay | Admitting: Endocrinology

## 2013-05-22 MED ORDER — HYDROCODONE-ACETAMINOPHEN 10-325 MG PO TABS: 1.0000 | ORAL_TABLET | ORAL | Status: DC | PRN

## 2013-05-22 NOTE — Telephone Encounter (Signed)
Pt advised to pick up rx. 

## 2013-05-22 NOTE — Telephone Encounter (Signed)
i printed 

## 2013-06-09 ENCOUNTER — Ambulatory Visit (INDEPENDENT_AMBULATORY_CARE_PROVIDER_SITE_OTHER): Payer: Medicare Other | Admitting: Endocrinology

## 2013-06-09 ENCOUNTER — Encounter: Payer: Self-pay | Admitting: Endocrinology

## 2013-06-09 VITALS — BP 128/78 | HR 62 | Temp 98.6°F | Resp 12 | Wt 225.8 lb

## 2013-06-09 DIAGNOSIS — R109 Unspecified abdominal pain: Secondary | ICD-10-CM

## 2013-06-09 MED ORDER — HYDROCODONE-ACETAMINOPHEN 10-325 MG PO TABS: 1.0000 | ORAL_TABLET | ORAL | Status: DC | PRN

## 2013-06-09 NOTE — Progress Notes (Signed)
Subjective:    Patient ID: Mark French, male    DOB: October 10, 1945, 67 y.o.   MRN: [id number]  HPI The state of at least three ongoing medical problems is addressed today, with interval history of each noted here: Pt returns for f/u of insulin-requiring DM (dx'ed 1995, on a routine blood test; he has moderate neuropathy of the lower extremities; he has associated CAD, renal insufficiency, and retinopathy; therapy has been limited by pt's need for a simple, inexpensive insulin regimen; he has never had severe hypoglycemia or DKA).  He denies hypoglycemia.  Wife states cbg's are well-controlled.   Cellulitis: left leg erythema continues to improve. abd pain persists: he has declined colonoscopy.  Denies n/v.   Constipation is better with lactulose.   Past Medical History  Diagnosis Date  . CORONARY ARTERY DISEASE 03/11/2007    s/p CABG 1998;  admitted 08/2011 with an inferior STEMI.  LHC 09/07/11: Severe  Three-vessel CAD, LIMA-LAD patent, SVG-RCA chronically occluded, SVG-circumflex chronically occluded, SVG-diagonal 95%.  PCI: Promus DES to the SVG-diagonal.  Procedure c/b transient CHB req CPR and TTVP;  myoview 4/13:large severe inferolateral infarct, no ischemia, EF 25%    . DIABETES MELLITUS, TYPE I 03/11/2007  . HYPERTENSION 03/11/2007  . 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 HYPERCHOLESTEROLEMIA 09/28/2008  . CERVICAL RADICULOPATHY, LEFT 09/23/2008  . PERIPHERAL NEUROPATHY 09/23/2008  . Chronic systolic heart failure 09/23/2008    Echocardiogram 09/07/11: Difficult study, inferior and posterior lateral wall hypokinesis, mild LVH, EF 45%, mild LAE.   [name] RENAL INSUFFICIENCY 03/15/2010  . ED (erectile dysfunction)   . Hypogonadism male   . GI bleed 2012    Multiple Duodenal ulcer   . Obesity   . Legally blind     bilaterally  . Stroke 41    age 8 months; residual right sided weakness  . History of blood transfusion 2012  . Charcot foot due to diabetes mellitus     chronic pain   . Osteoarthritis     right hip and shoulder  . COPD (chronic obstructive pulmonary disease)   . Myocardial infarction 2013b Jan  . PERIPHERAL VASCULAR INSUFFICIENCY,  LEGS, BILATERAL 08/17/2010    ABI's performed 04/19/11 R 0.72 L 0.71  . ANEMIA-IRON DEFICIENCY 09/23/2008    ulcer related last year  . Chronic edema     Chronic pedal edema    Past Surgical History  Procedure Laterality Date  . Carpal tunnel release      left  . Sigmoidoscopy  03/11/2001  . Venous doppler  01/30/2004  . Tonsillectomy and adenoidectomy      "as a kid"  . Cataract extraction w/ intraocular lens  implant, bilateral    . Coronary angioplasty with stent placement  08/2011    "1"  . Retinopathy surgery  2000's    "laser; both eyes"  . Wrist fusion  1976    right  . Anterior fusion cervical spine  2010    "C spine; Dr. Yetta Barre"  . Ankle fracture surgery  1976; ?date    LEFT:  fused; removed bulk of hardware  . Heel spur surgery  ~ 2007    ? left  . Irrigation and debridement knee  02/27/2012    Procedure: IRRIGATION AND DEBRIDEMENT KNEE;  Surgeon: Verlee Rossetti, MD;  Location: [facility] OR;  Service: Orthopedics;  Laterality: Right;  irrigation and drainage right knee septic bursitis  . Eye surgery  2008    cataract removal, cautery  . Coronary artery bypass graft  1998    CABG X5  . Fracture surgery      foot broken 1976    History   Social History  . Marital Status: Married    Spouse Name: N/A    Number of Children: 0  . Years of Education: N/A   Occupational History  . Disabled    Social History Main Topics  . Smoking status: Never Smoker   . Smokeless tobacco: Never Used  . Alcohol Use: No  . Drug Use: No  . Sexual Activity: No   Other Topics Concern  . Not on file   Social History Narrative   Comes to appointments with wife      0 Caffeine drinks daily     Current Outpatient Prescriptions on File Prior to Visit  Medication Sig Dispense Refill  . albuterol (PROVENTIL  HFA;VENTOLIN HFA) 108 (90 BASE) MCG/ACT inhaler Inhale 2 puffs into the lungs every 6 (six) hours as needed for wheezing or shortness of breath.       [name] albuterol (PROVENTIL) (2.5 MG/3ML) 0.083% nebulizer solution Take 2.5 mg by nebulization 2 (two) times daily.       [name] aspirin 81 MG tablet Take 81 mg by mouth at bedtime.       [name] atorvastatin (LIPITOR) 40 MG tablet Take 40 mg by mouth every evening.      . budesonide (PULMICORT) 0.25 MG/2ML nebulizer solution Take 0.25 mg by nebulization 2 (two) times daily.      . carvedilol (COREG) 6.25 MG tablet Take 6.25 mg by mouth 2 (two) times daily with a meal.      . clopidogrel (PLAVIX) 75 MG tablet Take 75 mg by mouth daily.      [name] co-enzyme Q-10 30 MG capsule Take 30 mg by mouth daily.      [name] docusate sodium (COLACE) 100 MG capsule Take 100 mg by mouth 2 (two) times daily as needed for constipation.      . gabapentin (NEURONTIN) 600 MG tablet TAKE 1 TABLET BY MOUTH THREE TIMES DAILY.  90 tablet  0  . gabapentin (NEURONTIN) 600 MG tablet Take 1 tablet (600 mg total) by mouth 3 (three) times daily.  90 tablet  3  . Gauze Pads & Dressings (CURITY NON-ADHERENT STRIPS) 3"X3" PADS 1 Device by Does not apply route 2 (two) times daily.  600 each  2  . insulin NPH (HUMULIN N,NOVOLIN N) 100 UNIT/ML injection Inject 55 Units into the skin daily before breakfast.       . ipratropium (ATROVENT) 0.02 % nebulizer solution Take 500 mcg by nebulization 2 (two) times daily.      . isosorbide mononitrate (IMDUR) 30 MG 24 hr tablet Take 30 mg by mouth daily.      [name] lactulose (CHRONULAC) 10 GM/15ML solution Take 45 mLs (30 g total) by mouth 2 (two) times daily.  960 mL  0  . levofloxacin (LEVAQUIN) 250 MG tablet Take 250 mg by mouth daily.       [name] losartan (COZAAR) 50 MG tablet Take 50 mg by mouth every morning.       . nitroGLYCERIN (NITROSTAT) 0.4 MG SL tablet Place 1 tablet (0.4 mg total) under the tongue every 5 (five) minutes as needed for chest pain.  25 tablet  5  .  pantoprazole (PROTONIX) 40 MG tablet TAKE 1 TABLET BY MOUTH DAILY  30 tablet  0  . pantoprazole (PROTONIX) 40 MG tablet Take 1 tablet (40 mg total) by mouth daily.  30 tablet  3  . polyethylene glycol (MIRALAX / GLYCOLAX) packet Take 17 g by mouth 2 (two) times daily.  30 each  0  . potassium chloride SA (K-DUR,KLOR-CON) 20 MEQ tablet Take 20 mEq by mouth 2 (two) times daily.      . silodosin (RAPAFLO) 8 MG CAPS capsule Take 8 mg by mouth every evening.       [name] spironolactone (ALDACTONE) 25 MG tablet Take 12.5 mg by mouth daily.      [name] torsemide (DEMADEX) 20 MG tablet Take 60 mg by mouth 2 (two) times daily.       No current facility-administered medications on file prior to visit.    Allergies  Allergen Reactions  . Sildenafil Nausea Only and Other (See Comments)    "took it once; heart went to pieces; never took it again"  . Percocet [Oxycodone-Acetaminophen] Other (See Comments)    Unknown reaction-possible altered mental state-per wife.    . Contrast Media [Iodinated Diagnostic Agents] Nausea Only    Family History  Problem Relation Age of Onset  . Heart disease Mother     Coronary Artery Disease  . Heart disease Father     Coronary Artery Disease  . Heart disease Paternal Grandmother     Coronary Artery Disease  . Colon cancer Neg Hx   . Diabetes Other     Grandmother    BP 128/78  Pulse 62  Temp(Src) 98.6 F (37 C)  Resp 12  Wt 225 lb 12.8 oz (102.422 kg)  SpO2 98%  Review of Systems He has lost 15 lbs x 5 months.  Denies brbpr.    Objective:   Physical Exam VITAL SIGNS:  See vs page GENERAL: no distress ABDOMEN:  abdomen is soft, nontender.  no hepatosplenomegaly.  not distended.  no hernia.        Assessment & Plan:  DM: apparently well-controlled abd pain, persistent, uncertain etiology.   Constipation: much better Cellulitis: much better

## 2013-06-09 NOTE — Patient Instructions (Addendum)
check your blood sugar twice a day.  vary the time of day when you check, between before the 3 meals, and at bedtime.  also check if you have symptoms of your blood sugar being too high or too low.  please keep a record of the readings and bring it to your next appointment here.  You can write it on any piece of paper.  please call us sooner if your blood sugar goes below 70, or if you have a lot of readings over 200. Refer back to Dr Juanda Chance.  you will receive a phone call, about a day and time for an appointment.  Please come back for a follow-up appointment in January.

## 2013-06-15 ENCOUNTER — Encounter (HOSPITAL_COMMUNITY): Payer: Self-pay | Admitting: Emergency Medicine

## 2013-06-15 ENCOUNTER — Emergency Department (HOSPITAL_COMMUNITY): Payer: Medicare Other

## 2013-06-15 ENCOUNTER — Emergency Department (HOSPITAL_COMMUNITY)
Admission: EM | Admit: 2013-06-15 | Discharge: 2013-06-15 | Disposition: A | Discharge status: Home / Self Care | Payer: Medicare Other | Attending: Emergency Medicine | Admitting: Emergency Medicine

## 2013-06-15 DIAGNOSIS — M169 Osteoarthritis of hip, unspecified: Secondary | ICD-10-CM | POA: Insufficient documentation

## 2013-06-15 DIAGNOSIS — M1610 Unilateral primary osteoarthritis, unspecified hip: Secondary | ICD-10-CM | POA: Insufficient documentation

## 2013-06-15 DIAGNOSIS — Z8679 Personal history of other diseases of the circulatory system: Secondary | ICD-10-CM

## 2013-06-15 DIAGNOSIS — Z792 Long term (current) use of antibiotics: Secondary | ICD-10-CM | POA: Insufficient documentation

## 2013-06-15 DIAGNOSIS — Z8719 Personal history of other diseases of the digestive system: Secondary | ICD-10-CM | POA: Insufficient documentation

## 2013-06-15 DIAGNOSIS — M19019 Primary osteoarthritis, unspecified shoulder: Secondary | ICD-10-CM | POA: Insufficient documentation

## 2013-06-15 DIAGNOSIS — Z87448 Personal history of other diseases of urinary system: Secondary | ICD-10-CM | POA: Insufficient documentation

## 2013-06-15 DIAGNOSIS — E7800 Pure hypercholesterolemia, unspecified: Secondary | ICD-10-CM | POA: Insufficient documentation

## 2013-06-15 DIAGNOSIS — I5022 Chronic systolic (congestive) heart failure: Secondary | ICD-10-CM | POA: Insufficient documentation

## 2013-06-15 DIAGNOSIS — M1990 Unspecified osteoarthritis, unspecified site: Secondary | ICD-10-CM | POA: Insufficient documentation

## 2013-06-15 DIAGNOSIS — Z951 Presence of aortocoronary bypass graft: Secondary | ICD-10-CM | POA: Insufficient documentation

## 2013-06-15 DIAGNOSIS — Z7982 Long term (current) use of aspirin: Secondary | ICD-10-CM | POA: Insufficient documentation

## 2013-06-15 DIAGNOSIS — Z8673 Personal history of transient ischemic attack (TIA), and cerebral infarction without residual deficits: Secondary | ICD-10-CM | POA: Insufficient documentation

## 2013-06-15 DIAGNOSIS — I252 Old myocardial infarction: Secondary | ICD-10-CM | POA: Insufficient documentation

## 2013-06-15 DIAGNOSIS — Z7902 Long term (current) use of antithrombotics/antiplatelets: Secondary | ICD-10-CM | POA: Insufficient documentation

## 2013-06-15 DIAGNOSIS — Z794 Long term (current) use of insulin: Secondary | ICD-10-CM | POA: Insufficient documentation

## 2013-06-15 DIAGNOSIS — H548 Legal blindness, as defined in USA: Secondary | ICD-10-CM | POA: Insufficient documentation

## 2013-06-15 DIAGNOSIS — IMO0002 Reserved for concepts with insufficient information to code with codable children: Secondary | ICD-10-CM | POA: Insufficient documentation

## 2013-06-15 DIAGNOSIS — E669 Obesity, unspecified: Secondary | ICD-10-CM | POA: Insufficient documentation

## 2013-06-15 DIAGNOSIS — Z79899 Other long term (current) drug therapy: Secondary | ICD-10-CM | POA: Insufficient documentation

## 2013-06-15 DIAGNOSIS — Z9861 Coronary angioplasty status: Secondary | ICD-10-CM | POA: Insufficient documentation

## 2013-06-15 DIAGNOSIS — E1049 Type 1 diabetes mellitus with other diabetic neurological complication: Secondary | ICD-10-CM | POA: Insufficient documentation

## 2013-06-15 DIAGNOSIS — I2510 Atherosclerotic heart disease of native coronary artery without angina pectoris: Secondary | ICD-10-CM | POA: Insufficient documentation

## 2013-06-15 DIAGNOSIS — J441 Chronic obstructive pulmonary disease with (acute) exacerbation: Secondary | ICD-10-CM | POA: Insufficient documentation

## 2013-06-15 DIAGNOSIS — E11359 Type 2 diabetes mellitus with proliferative diabetic retinopathy without macular edema: Secondary | ICD-10-CM | POA: Insufficient documentation

## 2013-06-15 DIAGNOSIS — R0600 Dyspnea, unspecified: Secondary | ICD-10-CM

## 2013-06-15 DIAGNOSIS — Z862 Personal history of diseases of the blood and blood-forming organs and certain disorders involving the immune mechanism: Secondary | ICD-10-CM | POA: Insufficient documentation

## 2013-06-15 DIAGNOSIS — G8929 Other chronic pain: Secondary | ICD-10-CM | POA: Insufficient documentation

## 2013-06-15 DIAGNOSIS — I10 Essential (primary) hypertension: Secondary | ICD-10-CM | POA: Insufficient documentation

## 2013-06-15 LAB — BASIC METABOLIC PANEL
BUN: 28 mg/dL — ABNORMAL HIGH (ref 6–23)
Chloride: 95 mEq/L — ABNORMAL LOW (ref 96–112)
GFR calc non Af Amer: 45 mL/min — ABNORMAL LOW (ref >90)
Glucose, Bld: 87 mg/dL (ref 70–99)
Potassium: 4 mEq/L (ref 3.5–5.1)
Sodium: 135 mEq/L (ref 135–145)

## 2013-06-15 LAB — CBC
HCT: 39.7 % (ref 39.0–52.0)
Hemoglobin: 13.2 g/dL (ref 13.0–17.0)
RBC: 4.52 MIL/uL (ref 4.22–5.81)
RDW: 14.1 % (ref 11.5–15.5)
WBC: 6.8 10*3/uL (ref 4.0–10.5)

## 2013-06-15 LAB — POCT I-STAT TROPONIN I: Troponin i, poc: 0.01 ng/mL (ref 0.00–0.08)

## 2013-06-15 MED ORDER — FUROSEMIDE 10 MG/ML IJ SOLN
40.0000 mg | Freq: Once | INTRAMUSCULAR | Status: AC
  Administered 2013-06-15: 40 mg via INTRAVENOUS
  Filled 2013-06-15: qty 4

## 2013-06-15 MED ORDER — HYDROCODONE-ACETAMINOPHEN 5-325 MG PO TABS: 2.0000 | ORAL_TABLET | Freq: Once | ORAL | Status: DC

## 2013-06-15 NOTE — ED Provider Notes (Signed)
CSN: [id number]     Arrival date & time 06/15/13  1406 History   First MD Initiated Contact with Patient 06/15/13 1456     Chief Complaint  Patient presents with  . Shortness of Breath  . Chest Pain   (Consider location/radiation/quality/duration/timing/severity/associated sxs/prior Treatment) HPI Comments: The patient is a 67 year old male with extensive past medical history including diabetes, coronary artery disease with bypass graft, COPD. He presents today with complaints of worsening shortness of breath since yesterday. He states that he feels tight in the chest but denies any chest pain. He denies any fevers or chills. He denies any productive cough. He states that he has had episodes of this in the past or related to excess fluid. He denies any change in his Demadex dose or having missed any doses.  Patient is a 67 y.o. male presenting with shortness of breath and chest pain. The history is provided by the patient.  Shortness of Breath Severity:  Moderate Onset quality:  Gradual Duration:  1 day Timing:  Constant Progression:  Worsening Chronicity:  Recurrent Relieved by:  Nothing Worsened by:  Nothing tried Ineffective treatments:  None tried Associated symptoms: chest pain   Chest Pain Associated symptoms: shortness of breath     Past Medical History  Diagnosis Date  . CORONARY ARTERY DISEASE 03/11/2007    s/p CABG 1998;  admitted 08/2011 with an inferior STEMI.  LHC 09/07/11: Severe  Three-vessel CAD, LIMA-LAD patent, SVG-RCA chronically occluded, SVG-circumflex chronically occluded, SVG-diagonal 95%.  PCI: Promus DES to the SVG-diagonal.  Procedure c/b transient CHB req CPR and TTVP;  myoview 4/13:large severe inferolateral infarct, no ischemia, EF 25%    . DIABETES MELLITUS, TYPE I 03/11/2007  . HYPERTENSION 03/11/2007  . 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 HYPERCHOLESTEROLEMIA 09/28/2008  . CERVICAL RADICULOPATHY, LEFT 09/23/2008  . PERIPHERAL NEUROPATHY  09/23/2008  . Chronic systolic heart failure 09/23/2008    Echocardiogram 09/07/11: Difficult study, inferior and posterior lateral wall hypokinesis, mild LVH, EF 45%, mild LAE.   [name] RENAL INSUFFICIENCY 03/15/2010  . ED (erectile dysfunction)   . Hypogonadism male   . GI bleed 2012    Multiple Duodenal ulcer   . Obesity   . Legally blind     bilaterally  . Stroke 23    age 40 months; residual right sided weakness  . History of blood transfusion 2012  . Charcot foot due to diabetes mellitus     chronic pain  . Osteoarthritis     right hip and shoulder  . COPD (chronic obstructive pulmonary disease)   . Myocardial infarction 2013b Jan  . PERIPHERAL VASCULAR INSUFFICIENCY,  LEGS, BILATERAL 08/17/2010    ABI's performed 04/19/11 R 0.72 L 0.71  . ANEMIA-IRON DEFICIENCY 09/23/2008    ulcer related last year  . Chronic edema     Chronic pedal edema   Past Surgical History  Procedure Laterality Date  . Carpal tunnel release      left  . Sigmoidoscopy  03/11/2001  . Venous doppler  01/30/2004  . Tonsillectomy and adenoidectomy      "as a kid"  . Cataract extraction w/ intraocular lens  implant, bilateral    . Coronary angioplasty with stent placement  08/2011    "1"  . Retinopathy surgery  2000's    "laser; both eyes"  . Wrist fusion  1976    right  . Anterior fusion cervical spine  2010    "C spine; Dr. Yetta Barre"  . Ankle fracture surgery  1976; ?date    LEFT:  fused; removed bulk of hardware  . Heel spur surgery  ~ 2007    ? left  . Irrigation and debridement knee  02/27/2012    Procedure: IRRIGATION AND DEBRIDEMENT KNEE;  Surgeon: Verlee Rossetti, MD;  Location: [facility] OR;  Service: Orthopedics;  Laterality: Right;  irrigation and drainage right knee septic bursitis  . Eye surgery  2008    cataract removal, cautery  . Coronary artery bypass graft  1998    CABG X5  . Fracture surgery      foot broken 1976   Family History  Problem Relation Age of Onset  . Heart disease Mother      Coronary Artery Disease  . Heart disease Father     Coronary Artery Disease  . Heart disease Paternal Grandmother     Coronary Artery Disease  . Colon cancer Neg Hx   . Diabetes Other     Grandmother   History  Substance Use Topics  . Smoking status: Never Smoker   . Smokeless tobacco: Never Used  . Alcohol Use: No    Review of Systems  Respiratory: Positive for shortness of breath.   Cardiovascular: Positive for chest pain.  All other systems reviewed and are negative.    Allergies  Sildenafil; Percocet; and Contrast media  Home Medications   Current Outpatient Rx  Name  Route  Sig  Dispense  Refill  . albuterol (PROVENTIL HFA;VENTOLIN HFA) 108 (90 BASE) MCG/ACT inhaler   Inhalation   Inhale 2 puffs into the lungs every 6 (six) hours as needed for wheezing or shortness of breath.          [name] albuterol (PROVENTIL) (2.5 MG/3ML) 0.083% nebulizer solution   Nebulization   Take 2.5 mg by nebulization 2 (two) times daily.          [name] aspirin 81 MG tablet   Oral   Take 81 mg by mouth at bedtime.          [name] atorvastatin (LIPITOR) 40 MG tablet   Oral   Take 40 mg by mouth every evening.         . budesonide (PULMICORT) 0.25 MG/2ML nebulizer solution   Nebulization   Take 0.25 mg by nebulization 2 (two) times daily.         . carvedilol (COREG) 6.25 MG tablet   Oral   Take 6.25 mg by mouth 2 (two) times daily with a meal.         . clopidogrel (PLAVIX) 75 MG tablet   Oral   Take 75 mg by mouth daily.         [name] co-enzyme Q-10 30 MG capsule   Oral   Take 30 mg by mouth daily.         [name] docusate sodium (COLACE) 100 MG capsule   Oral   Take 100 mg by mouth 2 (two) times daily as needed for constipation.         . gabapentin (NEURONTIN) 600 MG tablet      TAKE 1 TABLET BY MOUTH THREE TIMES DAILY.   90 tablet   0   . gabapentin (NEURONTIN) 600 MG tablet   Oral   Take 1 tablet (600 mg total) by mouth 3 (three) times daily.   90 tablet   3    . Gauze Pads & Dressings (CURITY NON-ADHERENT STRIPS) 3"X3" PADS   Does not apply   1 Device by Does not apply  route 2 (two) times daily.   600 each   2   . GENERLAC 10 GM/15ML SOLN               . HYDROcodone-acetaminophen (NORCO) 10-325 MG per tablet   Oral   Take 1 tablet by mouth every 4 (four) hours as needed for pain.   120 tablet   0   . insulin NPH (HUMULIN N,NOVOLIN N) 100 UNIT/ML injection   Subcutaneous   Inject 55 Units into the skin daily before breakfast.          . ipratropium (ATROVENT) 0.02 % nebulizer solution   Nebulization   Take 500 mcg by nebulization 2 (two) times daily.         . isosorbide mononitrate (IMDUR) 30 MG 24 hr tablet   Oral   Take 30 mg by mouth daily.         [name] lactulose (CHRONULAC) 10 GM/15ML solution   Oral   Take 45 mLs (30 g total) by mouth 2 (two) times daily.   960 mL   0   . levofloxacin (LEVAQUIN) 250 MG tablet   Oral   Take 250 mg by mouth daily.          [name] losartan (COZAAR) 50 MG tablet   Oral   Take 50 mg by mouth every morning.          . nitroGLYCERIN (NITROSTAT) 0.4 MG SL tablet   Sublingual   Place 1 tablet (0.4 mg total) under the tongue every 5 (five) minutes as needed for chest pain.   25 tablet   5   . pantoprazole (PROTONIX) 40 MG tablet      TAKE 1 TABLET BY MOUTH DAILY   30 tablet   0   . pantoprazole (PROTONIX) 40 MG tablet   Oral   Take 1 tablet (40 mg total) by mouth daily.   30 tablet   3   . polyethylene glycol (MIRALAX / GLYCOLAX) packet   Oral   Take 17 g by mouth 2 (two) times daily.   30 each   0   . potassium chloride SA (K-DUR,KLOR-CON) 20 MEQ tablet   Oral   Take 20 mEq by mouth 2 (two) times daily.         . silodosin (RAPAFLO) 8 MG CAPS capsule   Oral   Take 8 mg by mouth every evening.          [name] spironolactone (ALDACTONE) 25 MG tablet   Oral   Take 12.5 mg by mouth daily.         [name] torsemide (DEMADEX) 20 MG tablet   Oral   Take 60 mg by mouth 2  (two) times daily.          BP 124/48  Pulse 62  Temp(Src) 98 F (36.7 C) (Oral)  Resp 16  Ht 5\' 1"  (1.549 m)  Wt 226 lb 1.6 oz (102.558 kg)  BMI 42.74 kg/m2  SpO2 98% Physical Exam  Nursing note and vitals reviewed. Constitutional: He is oriented to person, place, and time. No distress.  Patient is a 67 year old male in no acute distress. He is awake alert and appropriate.  Neck: Normal range of motion. Neck supple.  Cardiovascular: Normal rate, regular rhythm and normal heart sounds.   No murmur heard. Pulmonary/Chest: Effort normal. No respiratory distress. He has no wheezes. He has rales. He exhibits no tenderness.  There are slight rales in the bases bilaterally.  Abdominal:  Soft. Bowel sounds are normal. He exhibits no distension. There is no tenderness.  Musculoskeletal: Normal range of motion.  There is 1+ bilateral lower extremity pitting edema noted.  Neurological: He is alert and oriented to person, place, and time.  Skin: Skin is warm and dry. No rash noted. He is not diaphoretic.    ED Course  Procedures (including critical care time) Labs Review Labs Reviewed  CBC  PRO B NATRIURETIC PEPTIDE  BASIC METABOLIC PANEL  POCT I-STAT TROPONIN I   Imaging Review Dg Chest 2 View  06/15/2013   CLINICAL DATA:  Shortness of breath, chest pain for 1 day.  EXAM: CHEST  2 VIEW  COMPARISON:  05/01/2013.  FINDINGS: The heart size and mediastinal contours are within normal limits. Prior CABG. Both lungs are clear. The visualized skeletal structures are unremarkable.  IMPRESSION: No active cardiopulmonary disease.   Electronically Signed   By: Elige Ko   On: 06/15/2013 14:55    EKG Interpretation     Ventricular Rate:  60 PR Interval:  262 QRS Duration: 140 QT Interval:  444 QTC Calculation: 444 R Axis:   -77 Text Interpretation:  Sinus rhythm with 1st degree A-V block Left axis deviation Non-specific intra-ventricular conduction block Inferior infarct , age  undetermined Anterolateral infarct , age undetermined Abnormal ECG            MDM  No diagnosis found. Patient is a 67 year old male with extensive medical history presents with complaints of shortness of breath. On exam he appears comfortable and is in no respiratory distress. His oxygen saturations while he is speaking with me are 100% while on room air. Workup reveals an unchanged BNP and laboratory studies that are all negative. He has been short of breath for the past 2 days and troponin is negative as well. His EKG is unchanged and I doubt there is an acute cardiac event. There is no evidence for congestive heart failure on his chest x-ray and no evidence for pneumonia. I am uncertain of the exact etiology of his symptoms however I can find nothing acute to explain this. I've considered pulmonary embolism, however I doubt this. There is no hypoxia, tachycardia, chest pain, or tachypnea and do not feel as though additional testing into this is indicated at this time. He will be discharged to home with instructions to followup with his primary care Dr. in the next few days. He sees Dr. Excell Seltzer from cardiology and also agrees to see him this week to be followed up as well. He is return to the emergency department if his symptoms worsen or change    Geoffery Lyons, MD 06/15/13 1730

## 2013-06-15 NOTE — ED Notes (Signed)
Pt with hx of chf to ED c/o sob yesterday that was relieved by 1 nitro.  Pt began experiencing chest pressure today, but has not taken nitro.

## 2013-06-15 NOTE — ED Notes (Signed)
Pt to ED for evaluation of shortness of breath onset 3 days ago- denies chest pain associated with symptoms.  Lung sounds clear throughout- diminished in lower lobes.  Pt also admits to decreased urination- denies painful urination.  Hx of CHF- no swelling noted to lower extremities.

## 2013-06-17 ENCOUNTER — Other Ambulatory Visit: Payer: Self-pay | Admitting: Cardiovascular Disease

## 2013-06-19 ENCOUNTER — Other Ambulatory Visit: Payer: Self-pay | Admitting: *Deleted

## 2013-06-19 MED ORDER — INSULIN NPH (HUMAN) (ISOPHANE) 100 UNIT/ML ~~LOC~~ SUSP: 70.0000 [IU] | Freq: Every day | SUBCUTANEOUS | Status: DC

## 2013-06-24 ENCOUNTER — Other Ambulatory Visit: Payer: Self-pay | Admitting: Cardiovascular Disease

## 2013-06-25 ENCOUNTER — Telehealth: Payer: Self-pay

## 2013-06-25 ENCOUNTER — Other Ambulatory Visit: Payer: Self-pay | Admitting: Endocrinology

## 2013-06-25 NOTE — Telephone Encounter (Signed)
The patient's wife called hoping to get a refill on his hydrocodone medication.    Callback - [phone]   Thanks!

## 2013-06-25 NOTE — Telephone Encounter (Signed)
Called and spoke with pt. Advised him that it is early for a refill. Asked the pt how many pills is he taking a day of the hydrocodone? Pt stated he is trying not to take many, but he is taking 4 a day. Be advised.

## 2013-06-25 NOTE — Telephone Encounter (Signed)
please call patient: This is early for a refill.  How many does pt take per day?

## 2013-06-25 NOTE — Telephone Encounter (Signed)
Ok, at 4/day, 120 would last 30 days (07/09/13).  At 5/day, 120 would last 24 days (07/03/13).  We can go a few days early, so please call back then.

## 2013-06-25 NOTE — Telephone Encounter (Signed)
The patient's wife called hoping to get a refill on his hydrocodone medication. Please advise.

## 2013-06-26 ENCOUNTER — Encounter: Payer: Self-pay | Admitting: *Deleted

## 2013-06-26 NOTE — Telephone Encounter (Signed)
Please tell pt to call back when he is closer to needing a refill

## 2013-06-26 NOTE — Telephone Encounter (Signed)
Please clarify your wishes

## 2013-06-26 NOTE — Telephone Encounter (Signed)
Called pt and advised him that it is not quite time to refill his hydrocodone and he needs to call back when it closer to time; 07/09/13. Pt not happy.

## 2013-06-27 ENCOUNTER — Other Ambulatory Visit: Payer: Self-pay | Admitting: *Deleted

## 2013-06-27 MED ORDER — HYDROCODONE-ACETAMINOPHEN 10-325 MG PO TABS: 1.0000 | ORAL_TABLET | ORAL | Status: DC | PRN

## 2013-06-27 NOTE — Telephone Encounter (Signed)
Ok, i am fine with the idea of taking an extended-release pain medication, to reduce the need for hydrocodone.  However, wouldn't it be easier to take a twice a day extended-release pill instead?

## 2013-06-27 NOTE — Telephone Encounter (Signed)
Pt spouse phoned requesting norco refill. She is picking it up prior to [name] closing the office this afternoon, because patient is almost out of his pain medication.

## 2013-06-27 NOTE — Telephone Encounter (Signed)
Wife, Jola Babinski, phoned, stating that the presently prescribed hydrocodone q4h prn isn't working & only lasts about 20 days.  Is requesting possibly a fentanyl patch prescription.  Please advise.  Callback number [phone]

## 2013-06-28 NOTE — Telephone Encounter (Signed)
For now, i have refilled the norco. At a future visit, i'll discuss taking a long-acting narcotic.

## 2013-07-02 ENCOUNTER — Ambulatory Visit (INDEPENDENT_AMBULATORY_CARE_PROVIDER_SITE_OTHER): Payer: Medicare Other | Admitting: Cardiovascular Disease

## 2013-07-02 ENCOUNTER — Encounter: Payer: Self-pay | Admitting: Cardiovascular Disease

## 2013-07-02 VITALS — BP 119/58 | HR 61 | Ht 61.0 in | Wt 230.0 lb

## 2013-07-02 DIAGNOSIS — I5022 Chronic systolic (congestive) heart failure: Secondary | ICD-10-CM

## 2013-07-02 NOTE — Progress Notes (Signed)
HPI:  67 year old gentleman presenting for followup evaluation. The patient has congestive heart failure, morbid obesity, and extensive coronary artery disease. He also has COPD and chronic kidney disease. He's had remote CABG and then last year presented with an anterolateral infarction related to severe disease in the diagonal vein graft. He has severe left ventricular systolic dysfunction. He's had recurrent hospitalizations for heart failure.  The patient has had multiple emergency room visits for shortness of breath. He was last seen there in November 9 with labs and x-ray demonstrating no evidence of a heart failure exacerbation. His troponin was negative. His BNP was 362 which is down from baseline. Creatinine was stable at 1.5. He was treated with a single dose of IV Lasix which helped him improve clinically.  Patient reports stability in his symptoms since that emergency room evaluation. His weight has not changed. His breathing is also unchanged. He's had no chest pain. His chronic leg swelling and stasis ulcerations have continued to require treatment.   Outpatient Encounter Prescriptions as of 07/02/2013  Medication Sig  . albuterol (PROVENTIL HFA;VENTOLIN HFA) 108 (90 BASE) MCG/ACT inhaler Inhale 2 puffs into the lungs every 6 (six) hours as needed for wheezing or shortness of breath.   [name] albuterol (PROVENTIL) (2.5 MG/3ML) 0.083% nebulizer solution Take 2.5 mg by nebulization 2 (two) times daily as needed.   [name] aspirin EC 81 MG tablet Take 81 mg by mouth at bedtime.  [name] atorvastatin (LIPITOR) 40 MG tablet Take 40 mg by mouth at bedtime.   . budesonide (PULMICORT) 0.25 MG/2ML nebulizer solution Take 0.25 mg by nebulization daily.   . carvedilol (COREG) 6.25 MG tablet Take 6.25 mg by mouth 2 (two) times daily with a meal.  . clopidogrel (PLAVIX) 75 MG tablet TAKE 1 TABLET BY MOUTH DAILY WITH BREAKFAST  . co-enzyme Q-10 30 MG capsule Take 30 mg by mouth daily at 2 PM daily at 2 PM.   .  docusate sodium (COLACE) 100 MG capsule Take 100 mg by mouth 2 (two) times daily as needed for constipation.  . gabapentin (NEURONTIN) 600 MG tablet Take 1 tablet (600 mg total) by mouth 3 (three) times daily.  . Gauze Pads & Dressings (CURITY NON-ADHERENT STRIPS) 3"X3" PADS 1 Device by Does not apply route 2 (two) times daily.  [name] HYDROcodone-acetaminophen (NORCO) 10-325 MG per tablet Take 1 tablet by mouth every 4 (four) hours as needed.  . insulin NPH (HUMULIN N,NOVOLIN N) 100 UNIT/ML injection Inject 70 Units into the skin daily before breakfast.  . ipratropium (ATROVENT) 0.02 % nebulizer solution Take 500 mcg by nebulization 2 (two) times daily.  . isosorbide mononitrate (IMDUR) 30 MG 24 hr tablet Take 30 mg by mouth daily.  [name] lactulose, encephalopathy, (GENERLAC) 10 GM/15ML SOLN Take 30 g by mouth daily. 45 mls  . losartan (COZAAR) 50 MG tablet TAKE 1 TABLET BY MOUTH EVERY DAY  . nitroGLYCERIN (NITROSTAT) 0.4 MG SL tablet Place 1 tablet (0.4 mg total) under the tongue every 5 (five) minutes as needed for chest pain.  [name] OVER THE COUNTER MEDICATION Apply 1 application topically daily as needed. Anti fungal cream  . OVER THE COUNTER MEDICATION Place 1 drop into both eyes daily as needed (dry eyes). [facility] eye drops  . pantoprazole (PROTONIX) 40 MG tablet Take 1 tablet (40 mg total) by mouth daily.  . polyethylene glycol (MIRALAX / GLYCOLAX) packet Take 17 g by mouth daily as needed (constipation).  . potassium chloride SA (K-DUR,KLOR-CON) 20 MEQ  tablet Take 20 mEq by mouth 2 (two) times daily.  . silodosin (RAPAFLO) 8 MG CAPS capsule Take 8 mg by mouth at bedtime.   [name] spironolactone (ALDACTONE) 25 MG tablet Take 12.5 mg by mouth daily.  [name] torsemide (DEMADEX) 20 MG tablet Take 60 mg by mouth 2 (two) times daily. Morning and mid afternoon  . [DISCONTINUED] losartan (COZAAR) 50 MG tablet Take 50 mg by mouth daily.     Allergies  Allergen Reactions  . Sildenafil Nausea Only and Other (See  Comments)    "took it once; heart went to pieces; never took it again"  . Percocet [Oxycodone-Acetaminophen] Other (See Comments)    Unknown reaction-possible altered mental state-per wife.    . Contrast Media [Iodinated Diagnostic Agents] Nausea Only    Past Medical History  Diagnosis Date  . CORONARY ARTERY DISEASE 03/11/2007    s/p CABG 1998;  admitted 08/2011 with an inferior STEMI.  LHC 09/07/11: Severe  Three-vessel CAD, LIMA-LAD patent, SVG-RCA chronically occluded, SVG-circumflex chronically occluded, SVG-diagonal 95%.  PCI: Promus DES to the SVG-diagonal.  Procedure c/b transient CHB req CPR and TTVP;  myoview 4/13:large severe inferolateral infarct, no ischemia, EF 25%    . DIABETES MELLITUS, TYPE I 03/11/2007  . HYPERTENSION 03/11/2007  . 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 HYPERCHOLESTEROLEMIA 09/28/2008  . CERVICAL RADICULOPATHY, LEFT 09/23/2008  . PERIPHERAL NEUROPATHY 09/23/2008  . Chronic systolic heart failure 09/23/2008    Echocardiogram 09/07/11: Difficult study, inferior and posterior lateral wall hypokinesis, mild LVH, EF 45%, mild LAE.   [name] RENAL INSUFFICIENCY 03/15/2010  . ED (erectile dysfunction)   . Hypogonadism male   . GI bleed 2012    Multiple Duodenal ulcer   . Obesity   . Legally blind     bilaterally  . Stroke 4    age 82 months; residual right sided weakness  . History of blood transfusion 2012  . Charcot foot due to diabetes mellitus     chronic pain  . Osteoarthritis     right hip and shoulder  . COPD (chronic obstructive pulmonary disease)   . Myocardial infarction 2013b Jan  . PERIPHERAL VASCULAR INSUFFICIENCY,  LEGS, BILATERAL 08/17/2010    ABI's performed 04/19/11 R 0.72 L 0.71  . ANEMIA-IRON DEFICIENCY 09/23/2008    ulcer related last year  . Chronic edema     Chronic pedal edema  . Hiatal hernia     ROS: Negative except as per HPI  BP 119/58  Pulse 61  Ht 5\' 1"  (1.549 m)  Wt 230 lb (104.327 kg)  BMI 43.48 kg/m2  PHYSICAL  EXAM: Pt is alert and oriented, pleasant, obese chronically ill-appearing male in NAD. He is in a motorized wheelchair. HEENT: normal Neck: JVP - normal, carotids 2+= without bruits Lungs: CTA bilaterally CV: RRR without murmur or gallop Abd: soft, NT, Positive BS, obese Ext: 2+ pretibial edema bilaterally. The left lower leg is wrapped in an Ace bandage Skin: warm/dry no rash  ASSESSMENT AND PLAN: 1. Coronary artery disease, native vessel. The patient is stable without anginal symptoms. He is on dual antiplatelet therapy with aspirin and Plavix. He will continue on his current medications without change.  2. Chronic mixed systolic and diastolic heart failure. Overall he appears stable. We reviewed the continued importance of sodium restriction, daily weights, and medication compliance. He will continue on his current dose of torsemide. Unfortunately he has not tolerated metolazone in the past.  3. Hypertension. Blood pressure is well controlled on his current  medical program.  The patient will return in 6 weeks with a metabolic panel before his office visit. He will call sooner if problems arise.  Tonny Bollman 07/04/2013 6:14 PM

## 2013-07-02 NOTE — Patient Instructions (Signed)
Your physician recommends that you schedule a follow-up appointment in: 6 WEEKS with Dr Excell Seltzer  Your physician recommends that you return for lab work in: 6 WEEKS (BMP and BNP)  Your physician recommends that you continue on your current medications as directed. Please refer to the Current Medication list given to you today.

## 2013-07-07 ENCOUNTER — Emergency Department (HOSPITAL_COMMUNITY)
Admission: EM | Admit: 2013-07-07 | Discharge: 2013-07-08 | Disposition: A | Discharge status: Home / Self Care | Payer: Medicare Other | Attending: Emergency Medicine | Admitting: Emergency Medicine

## 2013-07-07 ENCOUNTER — Encounter (HOSPITAL_COMMUNITY): Payer: Self-pay | Admitting: Emergency Medicine

## 2013-07-07 DIAGNOSIS — I739 Peripheral vascular disease, unspecified: Secondary | ICD-10-CM | POA: Insufficient documentation

## 2013-07-07 DIAGNOSIS — J449 Chronic obstructive pulmonary disease, unspecified: Secondary | ICD-10-CM | POA: Insufficient documentation

## 2013-07-07 DIAGNOSIS — M169 Osteoarthritis of hip, unspecified: Secondary | ICD-10-CM | POA: Insufficient documentation

## 2013-07-07 DIAGNOSIS — Z87448 Personal history of other diseases of urinary system: Secondary | ICD-10-CM | POA: Insufficient documentation

## 2013-07-07 DIAGNOSIS — Z79899 Other long term (current) drug therapy: Secondary | ICD-10-CM | POA: Insufficient documentation

## 2013-07-07 DIAGNOSIS — Z7902 Long term (current) use of antithrombotics/antiplatelets: Secondary | ICD-10-CM | POA: Insufficient documentation

## 2013-07-07 DIAGNOSIS — Z9861 Coronary angioplasty status: Secondary | ICD-10-CM | POA: Insufficient documentation

## 2013-07-07 DIAGNOSIS — Z862 Personal history of diseases of the blood and blood-forming organs and certain disorders involving the immune mechanism: Secondary | ICD-10-CM | POA: Insufficient documentation

## 2013-07-07 DIAGNOSIS — M1610 Unilateral primary osteoarthritis, unspecified hip: Secondary | ICD-10-CM | POA: Insufficient documentation

## 2013-07-07 DIAGNOSIS — M19019 Primary osteoarthritis, unspecified shoulder: Secondary | ICD-10-CM | POA: Insufficient documentation

## 2013-07-07 DIAGNOSIS — J4489 Other specified chronic obstructive pulmonary disease: Secondary | ICD-10-CM | POA: Insufficient documentation

## 2013-07-07 DIAGNOSIS — E1049 Type 1 diabetes mellitus with other diabetic neurological complication: Secondary | ICD-10-CM | POA: Insufficient documentation

## 2013-07-07 DIAGNOSIS — E669 Obesity, unspecified: Secondary | ICD-10-CM | POA: Insufficient documentation

## 2013-07-07 DIAGNOSIS — I10 Essential (primary) hypertension: Secondary | ICD-10-CM | POA: Insufficient documentation

## 2013-07-07 DIAGNOSIS — H548 Legal blindness, as defined in USA: Secondary | ICD-10-CM | POA: Insufficient documentation

## 2013-07-07 DIAGNOSIS — E7800 Pure hypercholesterolemia, unspecified: Secondary | ICD-10-CM | POA: Insufficient documentation

## 2013-07-07 DIAGNOSIS — Z8673 Personal history of transient ischemic attack (TIA), and cerebral infarction without residual deficits: Secondary | ICD-10-CM | POA: Insufficient documentation

## 2013-07-07 DIAGNOSIS — I252 Old myocardial infarction: Secondary | ICD-10-CM | POA: Insufficient documentation

## 2013-07-07 DIAGNOSIS — Z8719 Personal history of other diseases of the digestive system: Secondary | ICD-10-CM | POA: Insufficient documentation

## 2013-07-07 DIAGNOSIS — W208XXA Other cause of strike by thrown, projected or falling object, initial encounter: Secondary | ICD-10-CM | POA: Insufficient documentation

## 2013-07-07 DIAGNOSIS — Z951 Presence of aortocoronary bypass graft: Secondary | ICD-10-CM | POA: Insufficient documentation

## 2013-07-07 DIAGNOSIS — Y9389 Activity, other specified: Secondary | ICD-10-CM | POA: Insufficient documentation

## 2013-07-07 DIAGNOSIS — Z7982 Long term (current) use of aspirin: Secondary | ICD-10-CM | POA: Insufficient documentation

## 2013-07-07 DIAGNOSIS — Y929 Unspecified place or not applicable: Secondary | ICD-10-CM | POA: Insufficient documentation

## 2013-07-07 DIAGNOSIS — Z794 Long term (current) use of insulin: Secondary | ICD-10-CM | POA: Insufficient documentation

## 2013-07-07 DIAGNOSIS — Z9889 Other specified postprocedural states: Secondary | ICD-10-CM | POA: Insufficient documentation

## 2013-07-07 DIAGNOSIS — I2510 Atherosclerotic heart disease of native coronary artery without angina pectoris: Secondary | ICD-10-CM | POA: Insufficient documentation

## 2013-07-07 DIAGNOSIS — S80811A Abrasion, right lower leg, initial encounter: Secondary | ICD-10-CM

## 2013-07-07 DIAGNOSIS — G8929 Other chronic pain: Secondary | ICD-10-CM | POA: Insufficient documentation

## 2013-07-07 DIAGNOSIS — IMO0002 Reserved for concepts with insufficient information to code with codable children: Secondary | ICD-10-CM | POA: Insufficient documentation

## 2013-07-07 NOTE — ED Notes (Signed)
Pt dropped a speaker to his rt shin area and has a laceration to leg bleeding minimal wrapped. Pt is on blood thinners.

## 2013-07-08 ENCOUNTER — Other Ambulatory Visit: Payer: Self-pay

## 2013-07-08 MED ORDER — INSULIN NPH (HUMAN) (ISOPHANE) 100 UNIT/ML ~~LOC~~ SUSP: 70.0000 [IU] | Freq: Every day | SUBCUTANEOUS | Status: DC

## 2013-07-08 NOTE — ED Provider Notes (Signed)
CSN: [id number]     Arrival date & time 07/07/13  2121 History   First MD Initiated Contact with Patient 07/08/13 0026     Chief Complaint  Patient presents with  . Laceration   (Consider location/radiation/quality/duration/timing/severity/associated sxs/prior Treatment) HPI Patient presents to the emergency department with a skin tear and abrasion to his right lower leg.  Patient, states this occurred earlier tonight, when a speaker fell and hit his leg.  Patient, states, that he had trouble getting the bleeding to stop.  He is on Plavix. Past Medical History  Diagnosis Date  . CORONARY ARTERY DISEASE 03/11/2007    s/p CABG 1998;  admitted 08/2011 with an inferior STEMI.  LHC 09/07/11: Severe  Three-vessel CAD, LIMA-LAD patent, SVG-RCA chronically occluded, SVG-circumflex chronically occluded, SVG-diagonal 95%.  PCI: Promus DES to the SVG-diagonal.  Procedure c/b transient CHB req CPR and TTVP;  myoview 4/13:large severe inferolateral infarct, no ischemia, EF 25%    . DIABETES MELLITUS, TYPE I 03/11/2007  . HYPERTENSION 03/11/2007  . 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 HYPERCHOLESTEROLEMIA 09/28/2008  . CERVICAL RADICULOPATHY, LEFT 09/23/2008  . PERIPHERAL NEUROPATHY 09/23/2008  . Chronic systolic heart failure 09/23/2008    Echocardiogram 09/07/11: Difficult study, inferior and posterior lateral wall hypokinesis, mild LVH, EF 45%, mild LAE.   [name] RENAL INSUFFICIENCY 03/15/2010  . ED (erectile dysfunction)   . Hypogonadism male   . GI bleed 2012    Multiple Duodenal ulcer   . Obesity   . Legally blind     bilaterally  . Stroke 63    age 46 months; residual right sided weakness  . History of blood transfusion 2012  . Charcot foot due to diabetes mellitus     chronic pain  . Osteoarthritis     right hip and shoulder  . COPD (chronic obstructive pulmonary disease)   . Myocardial infarction 2013b Jan  . PERIPHERAL VASCULAR INSUFFICIENCY,  LEGS, BILATERAL 08/17/2010    ABI's  performed 04/19/11 R 0.72 L 0.71  . ANEMIA-IRON DEFICIENCY 09/23/2008    ulcer related last year  . Chronic edema     Chronic pedal edema  . Hiatal hernia    Past Surgical History  Procedure Laterality Date  . Carpal tunnel release      left  . Sigmoidoscopy  03/11/2001  . Venous doppler  01/30/2004  . Tonsillectomy and adenoidectomy      "as a kid"  . Cataract extraction w/ intraocular lens  implant, bilateral    . Coronary angioplasty with stent placement  08/2011    "1"  . Retinopathy surgery  2000's    "laser; both eyes"  . Wrist fusion  1976    right  . Anterior fusion cervical spine  2010    "C spine; Dr. Yetta Barre"  . Ankle fracture surgery  1976; ?date    LEFT:  fused; removed bulk of hardware  . Heel spur surgery  ~ 2007    ? left  . Irrigation and debridement knee  02/27/2012    Procedure: IRRIGATION AND DEBRIDEMENT KNEE;  Surgeon: Verlee Rossetti, MD;  Location: [facility] OR;  Service: Orthopedics;  Laterality: Right;  irrigation and drainage right knee septic bursitis  . Eye surgery  2008    cataract removal, cautery  . Coronary artery bypass graft  1998    CABG X5  . Fracture surgery      foot broken 1976   Family History  Problem Relation Age of Onset  . Heart disease Mother  Coronary Artery Disease  . Heart disease Father     Coronary Artery Disease  . Heart disease Paternal Grandmother     Coronary Artery Disease  . Colon cancer Neg Hx   . Diabetes Other     Grandmother   History  Substance Use Topics  . Smoking status: Never Smoker   . Smokeless tobacco: Never Used  . Alcohol Use: No    Review of Systems All other systems negative except as documented in the HPI. All pertinent positives and negatives as reviewed in the HPI. Allergies  Sildenafil; Percocet; and Contrast media  Home Medications   Current Outpatient Rx  Name  Route  Sig  Dispense  Refill  . albuterol (PROVENTIL HFA;VENTOLIN HFA) 108 (90 BASE) MCG/ACT inhaler   Inhalation   Inhale 2  puffs into the lungs every 6 (six) hours as needed for wheezing or shortness of breath.          [name] albuterol (PROVENTIL) (2.5 MG/3ML) 0.083% nebulizer solution   Nebulization   Take 2.5 mg by nebulization 2 (two) times daily as needed.          [name] aspirin EC 81 MG tablet   Oral   Take 81 mg by mouth at bedtime.         [name] atorvastatin (LIPITOR) 40 MG tablet   Oral   Take 40 mg by mouth at bedtime.          . budesonide (PULMICORT) 0.25 MG/2ML nebulizer solution   Nebulization   Take 0.25 mg by nebulization daily.          . carvedilol (COREG) 6.25 MG tablet   Oral   Take 6.25 mg by mouth 2 (two) times daily with a meal.         . clopidogrel (PLAVIX) 75 MG tablet      TAKE 1 TABLET BY MOUTH DAILY WITH BREAKFAST   30 tablet   6   . co-enzyme Q-10 30 MG capsule   Oral   Take 30 mg by mouth daily at 2 PM daily at 2 PM.          . docusate sodium (COLACE) 100 MG capsule   Oral   Take 100 mg by mouth 2 (two) times daily as needed for constipation.         . gabapentin (NEURONTIN) 600 MG tablet   Oral   Take 1 tablet (600 mg total) by mouth 3 (three) times daily.   90 tablet   3   . HYDROcodone-acetaminophen (NORCO) 10-325 MG per tablet   Oral   Take 1 tablet by mouth every 4 (four) hours as needed.   120 tablet   0   . insulin NPH (HUMULIN N,NOVOLIN N) 100 UNIT/ML injection   Subcutaneous   Inject 70 Units into the skin daily before breakfast.   1 vial   0   . ipratropium (ATROVENT) 0.02 % nebulizer solution   Nebulization   Take 500 mcg by nebulization 2 (two) times daily.         . isosorbide mononitrate (IMDUR) 30 MG 24 hr tablet   Oral   Take 30 mg by mouth daily.         [name] lactulose, encephalopathy, (GENERLAC) 10 GM/15ML SOLN   Oral   Take 30 g by mouth daily.          [name] losartan (COZAAR) 50 MG tablet      TAKE 1 TABLET BY MOUTH EVERY DAY  30 tablet   0   . OVER THE COUNTER MEDICATION   Both Eyes   Place 1 drop into both  eyes daily as needed (dry eyes). [facility] eye drops         . pantoprazole (PROTONIX) 40 MG tablet   Oral   Take 1 tablet (40 mg total) by mouth daily.   30 tablet   3   . polyethylene glycol (MIRALAX / GLYCOLAX) packet   Oral   Take 17 g by mouth daily as needed (constipation).         . potassium chloride SA (K-DUR,KLOR-CON) 20 MEQ tablet   Oral   Take 20 mEq by mouth 2 (two) times daily.         . silodosin (RAPAFLO) 8 MG CAPS capsule   Oral   Take 8 mg by mouth at bedtime.          [name] spironolactone (ALDACTONE) 25 MG tablet   Oral   Take 12.5 mg by mouth daily.         [name] torsemide (DEMADEX) 20 MG tablet   Oral   Take 60 mg by mouth 2 (two) times daily. Morning and mid afternoon         . nitroGLYCERIN (NITROSTAT) 0.4 MG SL tablet   Sublingual   Place 1 tablet (0.4 mg total) under the tongue every 5 (five) minutes as needed for chest pain.   25 tablet   5   . OVER THE COUNTER MEDICATION   Topical   Apply 1 application topically daily as needed. Anti fungal cream          BP 148/57  Pulse 61  Temp(Src) 97.4 F (36.3 C) (Oral)  Resp 18  SpO2 100% Physical Exam  Nursing note and vitals reviewed. Constitutional: He appears well-developed and well-nourished.  HENT:  Head: Normocephalic and atraumatic.  Mouth/Throat: Oropharynx is clear and moist.  Neck: Normal range of motion. Neck supple.  Cardiovascular: Normal rate, regular rhythm and normal heart sounds.  Exam reveals no gallop and no friction rub.   No murmur heard. Pulmonary/Chest: Effort normal and breath sounds normal.  Musculoskeletal:       Legs: Neurological: He exhibits normal muscle tone. Coordination normal.  Skin: Skin is warm and dry.    ED Course  Procedures (including critical care time) The area is cleaned and there is no bleeding at this time.  There is a dressing applied.  Patient is advised followup with primary care Dr. for recheck.  Told to return here as  needed.    Carlyle Dolly, PA-C 07/08/13 0109

## 2013-07-08 NOTE — Telephone Encounter (Signed)
Called asking for refill. Refill sent to [facility] pharmacy.

## 2013-07-08 NOTE — ED Provider Notes (Signed)
Medical screening examination/treatment/procedure(s) were performed by non-physician practitioner and as supervising physician I was immediately available for consultation/collaboration.  EKG Interpretation   None       Devoria Albe, MD, Armando Gang   Ward Givens, MD 07/08/13 [phone]

## 2013-07-14 ENCOUNTER — Other Ambulatory Visit: Payer: Self-pay

## 2013-07-14 MED ORDER — INSULIN NPH (HUMAN) (ISOPHANE) 100 UNIT/ML ~~LOC~~ SUSP: 70.0000 [IU] | Freq: Every day | SUBCUTANEOUS | Status: DC

## 2013-07-15 ENCOUNTER — Observation Stay (HOSPITAL_COMMUNITY)
Admission: EM | Admit: 2013-07-15 | Discharge: 2013-07-16 | Disposition: A | Discharge status: Home / Self Care | Payer: Medicare Other | Attending: Internal Medicine | Admitting: Internal Medicine

## 2013-07-15 ENCOUNTER — Encounter (HOSPITAL_COMMUNITY): Payer: Self-pay | Admitting: Emergency Medicine

## 2013-07-15 ENCOUNTER — Emergency Department (HOSPITAL_COMMUNITY): Payer: Medicare Other

## 2013-07-15 DIAGNOSIS — W19XXXA Unspecified fall, initial encounter: Secondary | ICD-10-CM | POA: Insufficient documentation

## 2013-07-15 DIAGNOSIS — M25519 Pain in unspecified shoulder: Secondary | ICD-10-CM | POA: Insufficient documentation

## 2013-07-15 DIAGNOSIS — I509 Heart failure, unspecified: Secondary | ICD-10-CM | POA: Insufficient documentation

## 2013-07-15 DIAGNOSIS — Z6841 Body Mass Index (BMI) 40.0 and over, adult: Secondary | ICD-10-CM | POA: Insufficient documentation

## 2013-07-15 DIAGNOSIS — I2510 Atherosclerotic heart disease of native coronary artery without angina pectoris: Secondary | ICD-10-CM | POA: Insufficient documentation

## 2013-07-15 DIAGNOSIS — R55 Syncope and collapse: Principal | ICD-10-CM | POA: Insufficient documentation

## 2013-07-15 DIAGNOSIS — E7800 Pure hypercholesterolemia, unspecified: Secondary | ICD-10-CM | POA: Insufficient documentation

## 2013-07-15 DIAGNOSIS — I5022 Chronic systolic (congestive) heart failure: Secondary | ICD-10-CM | POA: Insufficient documentation

## 2013-07-15 DIAGNOSIS — I129 Hypertensive chronic kidney disease with stage 1 through stage 4 chronic kidney disease, or unspecified chronic kidney disease: Secondary | ICD-10-CM | POA: Insufficient documentation

## 2013-07-15 DIAGNOSIS — H548 Legal blindness, as defined in USA: Secondary | ICD-10-CM | POA: Insufficient documentation

## 2013-07-15 DIAGNOSIS — I25810 Atherosclerosis of coronary artery bypass graft(s) without angina pectoris: Secondary | ICD-10-CM | POA: Insufficient documentation

## 2013-07-15 DIAGNOSIS — I872 Venous insufficiency (chronic) (peripheral): Secondary | ICD-10-CM | POA: Insufficient documentation

## 2013-07-15 DIAGNOSIS — N183 Chronic kidney disease, stage 3 unspecified: Secondary | ICD-10-CM | POA: Insufficient documentation

## 2013-07-15 DIAGNOSIS — I252 Old myocardial infarction: Secondary | ICD-10-CM | POA: Insufficient documentation

## 2013-07-15 DIAGNOSIS — Z7902 Long term (current) use of antithrombotics/antiplatelets: Secondary | ICD-10-CM | POA: Insufficient documentation

## 2013-07-15 DIAGNOSIS — E1039 Type 1 diabetes mellitus with other diabetic ophthalmic complication: Secondary | ICD-10-CM | POA: Insufficient documentation

## 2013-07-15 DIAGNOSIS — Z7982 Long term (current) use of aspirin: Secondary | ICD-10-CM | POA: Insufficient documentation

## 2013-07-15 DIAGNOSIS — J4489 Other specified chronic obstructive pulmonary disease: Secondary | ICD-10-CM | POA: Insufficient documentation

## 2013-07-15 DIAGNOSIS — L8990 Pressure ulcer of unspecified site, unspecified stage: Secondary | ICD-10-CM | POA: Insufficient documentation

## 2013-07-15 DIAGNOSIS — E11359 Type 2 diabetes mellitus with proliferative diabetic retinopathy without macular edema: Secondary | ICD-10-CM | POA: Insufficient documentation

## 2013-07-15 DIAGNOSIS — E669 Obesity, unspecified: Secondary | ICD-10-CM | POA: Insufficient documentation

## 2013-07-15 DIAGNOSIS — J449 Chronic obstructive pulmonary disease, unspecified: Secondary | ICD-10-CM | POA: Insufficient documentation

## 2013-07-15 DIAGNOSIS — Z9861 Coronary angioplasty status: Secondary | ICD-10-CM | POA: Insufficient documentation

## 2013-07-15 HISTORY — DX: Syncope and collapse: R55

## 2013-07-15 HISTORY — DX: Chronic kidney disease, stage 3 unspecified: N18.30

## 2013-07-15 HISTORY — DX: Venous insufficiency (chronic) (peripheral): I87.2

## 2013-07-15 HISTORY — DX: Chronic kidney disease, stage 3 (moderate): N18.3

## 2013-07-15 LAB — URINALYSIS, ROUTINE W REFLEX MICROSCOPIC
Bilirubin Urine: NEGATIVE
Glucose, UA: NEGATIVE mg/dL
Hgb urine dipstick: NEGATIVE
Ketones, ur: NEGATIVE mg/dL
Protein, ur: NEGATIVE mg/dL
pH: 7 (ref 5.0–8.0)

## 2013-07-15 LAB — POCT I-STAT TROPONIN I: Troponin i, poc: 0.02 ng/mL (ref 0.00–0.08)

## 2013-07-15 LAB — CBC WITH DIFFERENTIAL/PLATELET
Basophils Absolute: 0 10*3/uL (ref 0.0–0.1)
Basophils Relative: 0 % (ref 0–1)
Eosinophils Absolute: 0.3 10*3/uL (ref 0.0–0.7)
Eosinophils Relative: 3 % (ref 0–5)
HCT: 37.4 % — ABNORMAL LOW (ref 39.0–52.0)
Lymphocytes Relative: 10 % — ABNORMAL LOW (ref 12–46)
MCHC: 32.4 g/dL (ref 30.0–36.0)
Monocytes Absolute: 1.2 10*3/uL — ABNORMAL HIGH (ref 0.1–1.0)
Neutro Abs: 7.7 10*3/uL (ref 1.7–7.7)
Neutrophils Relative %: 76 % (ref 43–77)
Platelets: 203 10*3/uL (ref 150–400)
RDW: 14.3 % (ref 11.5–15.5)
WBC: 10.2 10*3/uL (ref 4.0–10.5)

## 2013-07-15 LAB — COMPREHENSIVE METABOLIC PANEL
ALT: 19 U/L (ref 0–53)
AST: 20 U/L (ref 0–37)
Albumin: 3.4 g/dL — ABNORMAL LOW (ref 3.5–5.2)
CO2: 31 mEq/L (ref 19–32)
Chloride: 95 mEq/L — ABNORMAL LOW (ref 96–112)
GFR calc non Af Amer: 49 mL/min — ABNORMAL LOW (ref >90)
Potassium: 4.1 mEq/L (ref 3.5–5.1)
Sodium: 135 mEq/L (ref 135–145)
Total Bilirubin: 0.3 mg/dL (ref 0.3–1.2)

## 2013-07-15 MED ORDER — HYDROCODONE-ACETAMINOPHEN 5-325 MG PO TABS
2.0000 | ORAL_TABLET | Freq: Once | ORAL | Status: AC
  Administered 2013-07-15: 2 via ORAL
  Filled 2013-07-15: qty 2

## 2013-07-15 MED ORDER — ONDANSETRON HCL 4 MG/2ML IJ SOLN
4.0000 mg | Freq: Once | INTRAMUSCULAR | Status: AC
  Administered 2013-07-15: 4 mg via INTRAVENOUS
  Filled 2013-07-15: qty 2

## 2013-07-15 NOTE — ED Provider Notes (Signed)
CSN: [id number]     Arrival date & time 07/15/13  1533 History   First MD Initiated Contact with Patient 07/15/13 1543     Chief Complaint  Patient presents with  . Fall  . Near Syncope   (Consider location/radiation/quality/duration/timing/severity/associated sxs/prior Treatment) HPI Patient reports today he had gone shopping for Christmas present for his wife. He came home and was sitting in a chair after putting on a heavy shirt and he got acutely dizzy feeling like he was going to pass out. He states the next thing he knew he woke up on the floor. He is unsure how long he was unconscious. He was able to press his panic button to call EMS for help. He reports after EMS got there he got very nauseated and vomited. He states his nausea is almost gone now. He denies headache, chest pain, shortness of breath, any injury from the fall. He denies cough, fever, or sore throat. He states he ate breakfast and lunch and ate his usual amount. He denies being around anybody else who is ill. He states he's never had syncope before.  PCP [name] Cardiologist Dr. Excell Seltzer of [place]  Past Medical History  Diagnosis Date  . CORONARY ARTERY DISEASE 03/11/2007    s/p CABG 1998;  admitted 08/2011 with an inferior STEMI.  LHC 09/07/11: Severe  Three-vessel CAD, LIMA-LAD patent, SVG-RCA chronically occluded, SVG-circumflex chronically occluded, SVG-diagonal 95%.  PCI: Promus DES to the SVG-diagonal.  Procedure c/b transient CHB req CPR and TTVP;  myoview 4/13:large severe inferolateral infarct, no ischemia, EF 25%    . DIABETES MELLITUS, TYPE I 03/11/2007  . HYPERTENSION 03/11/2007  . 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 HYPERCHOLESTEROLEMIA 09/28/2008  . CERVICAL RADICULOPATHY, LEFT 09/23/2008  . PERIPHERAL NEUROPATHY 09/23/2008  . Chronic systolic heart failure 09/23/2008    Echocardiogram 09/07/11: Difficult study, inferior and posterior lateral wall hypokinesis, mild LVH, EF 45%, mild LAE.   [name]  RENAL INSUFFICIENCY 03/15/2010  . ED (erectile dysfunction)   . Hypogonadism male   . GI bleed 2012    Multiple Duodenal ulcer   . Obesity   . Legally blind     bilaterally  . Stroke 28    age 18 months; residual right sided weakness  . History of blood transfusion 2012  . Charcot foot due to diabetes mellitus     chronic pain  . Osteoarthritis     right hip and shoulder  . COPD (chronic obstructive pulmonary disease)   . Myocardial infarction 2013b Jan  . PERIPHERAL VASCULAR INSUFFICIENCY,  LEGS, BILATERAL 08/17/2010    ABI's performed 04/19/11 R 0.72 L 0.71  . ANEMIA-IRON DEFICIENCY 09/23/2008    ulcer related last year  . Chronic edema     Chronic pedal edema  . Hiatal hernia    Past Surgical History  Procedure Laterality Date  . Carpal tunnel release      left  . Sigmoidoscopy  03/11/2001  . Venous doppler  01/30/2004  . Tonsillectomy and adenoidectomy      "as a kid"  . Cataract extraction w/ intraocular lens  implant, bilateral    . Coronary angioplasty with stent placement  08/2011    "1"  . Retinopathy surgery  2000's    "laser; both eyes"  . Wrist fusion  1976    right  . Anterior fusion cervical spine  2010    "C spine; Dr. Yetta Barre"  . Ankle fracture surgery  1976; ?date    LEFT:  fused; removed  bulk of hardware  . Heel spur surgery  ~ 2007    ? left  . Irrigation and debridement knee  02/27/2012    Procedure: IRRIGATION AND DEBRIDEMENT KNEE;  Surgeon: Verlee Rossetti, MD;  Location: [facility] OR;  Service: Orthopedics;  Laterality: Right;  irrigation and drainage right knee septic bursitis  . Eye surgery  2008    cataract removal, cautery  . Coronary artery bypass graft  1998    CABG X5  . Fracture surgery      foot broken 1976   Family History  Problem Relation Age of Onset  . Heart disease Mother     Coronary Artery Disease  . Heart disease Father     Coronary Artery Disease  . Heart disease Paternal Grandmother     Coronary Artery Disease  . Colon cancer  Neg Hx   . Diabetes Other     Grandmother   History  Substance Use Topics  . Smoking status: Never Smoker   . Smokeless tobacco: Never Used  . Alcohol Use: No   lives at home This with spouse  Review of Systems  All other systems reviewed and are negative.    Allergies  Sildenafil; Percocet; and Contrast media  Home Medications   Current Outpatient Rx  Name  Route  Sig  Dispense  Refill  . albuterol (PROVENTIL HFA;VENTOLIN HFA) 108 (90 BASE) MCG/ACT inhaler   Inhalation   Inhale 2 puffs into the lungs every 6 (six) hours as needed for wheezing or shortness of breath.          [name] albuterol (PROVENTIL) (2.5 MG/3ML) 0.083% nebulizer solution   Nebulization   Take 2.5 mg by nebulization 2 (two) times daily as needed.          [name] aspirin EC 81 MG tablet   Oral   Take 81 mg by mouth at bedtime.         [name] atorvastatin (LIPITOR) 40 MG tablet   Oral   Take 40 mg by mouth at bedtime.          . budesonide (PULMICORT) 0.25 MG/2ML nebulizer solution   Nebulization   Take 0.25 mg by nebulization daily.          . carvedilol (COREG) 6.25 MG tablet   Oral   Take 6.25 mg by mouth 2 (two) times daily with a meal.         . clopidogrel (PLAVIX) 75 MG tablet      TAKE 1 TABLET BY MOUTH DAILY WITH BREAKFAST   30 tablet   6   . co-enzyme Q-10 30 MG capsule   Oral   Take 30 mg by mouth daily at 2 PM daily at 2 PM.          . docusate sodium (COLACE) 100 MG capsule   Oral   Take 100 mg by mouth 2 (two) times daily as needed for constipation.         . gabapentin (NEURONTIN) 600 MG tablet   Oral   Take 1 tablet (600 mg total) by mouth 3 (three) times daily.   90 tablet   3   . HYDROcodone-acetaminophen (NORCO) 10-325 MG per tablet   Oral   Take 1 tablet by mouth every 4 (four) hours as needed.   120 tablet   0   . insulin NPH (HUMULIN N,NOVOLIN N) 100 UNIT/ML injection   Subcutaneous   Inject 70 Units into the skin daily before breakfast.  2 vial    2   . ipratropium (ATROVENT) 0.02 % nebulizer solution   Nebulization   Take 500 mcg by nebulization 2 (two) times daily.         . isosorbide mononitrate (IMDUR) 30 MG 24 hr tablet   Oral   Take 30 mg by mouth daily.         [name] lactulose, encephalopathy, (GENERLAC) 10 GM/15ML SOLN   Oral   Take 30 g by mouth daily.          [name] losartan (COZAAR) 50 MG tablet      TAKE 1 TABLET BY MOUTH EVERY DAY   30 tablet   0   . nitroGLYCERIN (NITROSTAT) 0.4 MG SL tablet   Sublingual   Place 1 tablet (0.4 mg total) under the tongue every 5 (five) minutes as needed for chest pain.   25 tablet   5   . OVER THE COUNTER MEDICATION   Topical   Apply 1 application topically daily as needed. Anti fungal cream         . OVER THE COUNTER MEDICATION   Both Eyes   Place 1 drop into both eyes daily as needed (dry eyes). [facility] eye drops         . pantoprazole (PROTONIX) 40 MG tablet   Oral   Take 1 tablet (40 mg total) by mouth daily.   30 tablet   3   . polyethylene glycol (MIRALAX / GLYCOLAX) packet   Oral   Take 17 g by mouth daily as needed (constipation).         . potassium chloride SA (K-DUR,KLOR-CON) 20 MEQ tablet   Oral   Take 20 mEq by mouth 2 (two) times daily.         . silodosin (RAPAFLO) 8 MG CAPS capsule   Oral   Take 8 mg by mouth at bedtime.          [name] spironolactone (ALDACTONE) 25 MG tablet   Oral   Take 12.5 mg by mouth daily.         [name] torsemide (DEMADEX) 20 MG tablet   Oral   Take 60 mg by mouth 2 (two) times daily. Morning and mid afternoon          BP 110/47  Pulse 74  Temp(Src) 97.6 F (36.4 C) (Oral)  Resp 20  SpO2 97%  Vital signs normal   Physical Exam  Nursing note and vitals reviewed. Constitutional: He is oriented to person, place, and time. He appears well-developed and well-nourished.  Non-toxic appearance. He does not appear ill. No distress.  HENT:  Head: Normocephalic and atraumatic.  Right Ear: External ear  normal.  Left Ear: External ear normal.  Nose: Nose normal. No mucosal edema or rhinorrhea.  Mouth/Throat: Oropharynx is clear and moist and mucous membranes are normal. No dental abscesses or uvula swelling.  Eyes: Conjunctivae and EOM are normal. Pupils are equal, round, and reactive to light.  Neck: Normal range of motion and full passive range of motion without pain. Neck supple.  Cardiovascular: Normal rate, regular rhythm and normal heart sounds.  Exam reveals no gallop and no friction rub.   No murmur heard. Pulmonary/Chest: Effort normal and breath sounds normal. No respiratory distress. He has no wheezes. He has no rhonchi. He has no rales. He exhibits no tenderness and no crepitus.  Abdominal: Soft. Normal appearance and bowel sounds are normal. He exhibits no distension. There is no tenderness. There is  no rebound and no guarding.  Musculoskeletal: Normal range of motion. He exhibits no edema and no tenderness.  Moves all extremities well. Patient has bilateral redness of both lower legs with a wound on either side that is dressed. See Photo Patient has some muscle wasting in his right hand, patient states he had a stroke when he was 67 years old and he has weakness in that hand.  Neurological: He is alert and oriented to person, place, and time. He has normal strength. No cranial nerve deficit.  Skin: Skin is warm, dry and intact. No rash noted. No erythema. No pallor.  Psychiatric: He has a normal mood and affect. His speech is normal and behavior is normal. His mood appears not anxious.       ED Course  Procedures (including critical care time)  Medications  ondansetron (ZOFRAN) injection 4 mg (4 mg Intravenous Given 07/15/13 1749)  HYDROcodone-acetaminophen (NORCO/VICODIN) 5-325 MG per tablet 2 tablet (2 tablets Oral Given 07/15/13 1958)   EKG is obtained via EMS show initially some ST depression in the septal leads at 1450, and 1455, but at 1509 the ST depression had almost  resolved.  Patient has remained stable while in the ED. He did request his usual chronic pain medications for the ulcers on his legs.  Patient turned over to Coral Ceo PA at 2030 p.m. awaiting for cardiology consult to be called. Disposition will be after discussing patient with his cardiologist.  Labs Review Results for orders placed during the hospital encounter of 07/15/13  CBC WITH DIFFERENTIAL      Result Value Range   WBC 10.2  4.0 - 10.5 K/uL   RBC 4.21 (*) 4.22 - 5.81 MIL/uL   Hemoglobin 12.1 (*) 13.0 - 17.0 g/dL   HCT 45.4 (*) 09.8 - 11.9 %   MCV 88.8  78.0 - 100.0 fL   MCH 28.7  26.0 - 34.0 pg   MCHC 32.4  30.0 - 36.0 g/dL   RDW 14.7  82.9 - 56.2 %   Platelets 203  150 - 400 K/uL   Neutrophils Relative % 76  43 - 77 %   Neutro Abs 7.7  1.7 - 7.7 K/uL   Lymphocytes Relative 10 (*) 12 - 46 %   Lymphs Abs 1.0  0.7 - 4.0 K/uL   Monocytes Relative 12  3 - 12 %   Monocytes Absolute 1.2 (*) 0.1 - 1.0 K/uL   Eosinophils Relative 3  0 - 5 %   Eosinophils Absolute 0.3  0.0 - 0.7 K/uL   Basophils Relative 0  0 - 1 %   Basophils Absolute 0.0  0.0 - 0.1 K/uL  COMPREHENSIVE METABOLIC PANEL      Result Value Range   Sodium 135  135 - 145 mEq/L   Potassium 4.1  3.5 - 5.1 mEq/L   Chloride 95 (*) 96 - 112 mEq/L   CO2 31  19 - 32 mEq/L   Glucose, Bld 171 (*) 70 - 99 mg/dL   BUN 21  6 - 23 mg/dL   Creatinine, Ser 1.30 (*) 0.50 - 1.35 mg/dL   Calcium 8.9  8.4 - 86.5 mg/dL   Total Protein 7.1  6.0 - 8.3 g/dL   Albumin 3.4 (*) 3.5 - 5.2 g/dL   AST 20  0 - 37 U/L   ALT 19  0 - 53 U/L   Alkaline Phosphatase 92  39 - 117 U/L   Total Bilirubin 0.3  0.3 - 1.2 mg/dL  GFR calc non Af Amer 49 (*) >90 mL/min   GFR calc Af Amer 57 (*) >90 mL/min  TROPONIN I      Result Value Range   Troponin I <0.30  <0.30 ng/mL  URINALYSIS, ROUTINE W REFLEX MICROSCOPIC      Result Value Range   Color, Urine YELLOW  YELLOW   APPearance CLEAR  CLEAR   Specific Gravity, Urine 1.009  1.005 - 1.030     pH 7.0  5.0 - 8.0   Glucose, UA NEGATIVE  NEGATIVE mg/dL   Hgb urine dipstick NEGATIVE  NEGATIVE   Bilirubin Urine NEGATIVE  NEGATIVE   Ketones, ur NEGATIVE  NEGATIVE mg/dL   Protein, ur NEGATIVE  NEGATIVE mg/dL   Urobilinogen, UA 0.2  0.0 - 1.0 mg/dL   Nitrite NEGATIVE  NEGATIVE   Leukocytes, UA NEGATIVE  NEGATIVE  POCT I-STAT TROPONIN I      Result Value Range   Troponin i, poc 0.02  0.00 - 0.08 ng/mL   Comment 3            Laboratory interpretation all normal except renal insuffic, mildly elevated BNP    Imaging Review Ct Head Wo Contrast  07/15/2013   CLINICAL DATA:  Fall, syncope  EXAM: CT HEAD WITHOUT CONTRAST  TECHNIQUE: Contiguous axial images were obtained from the base of the skull through the vertex without intravenous contrast.  COMPARISON:  03/26/2013  FINDINGS: No skull fracture is noted. No intracranial hemorrhage, mass effect or midline shift. Stable periventricular chronic white matter disease. 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 No acute cortical infarction. No mass lesion is noted on this unenhanced scan.  Paranasal sinuses and mastoid air cells are unremarkable.  IMPRESSION: No acute intracranial abnormality. 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   Electronically Signed   By: Natasha Mead M.D.   On: 07/15/2013 17:43    EKG Interpretation    Date/Time:  Tuesday July 15 2013 15:45:04 EST Ventricular Rate:  73 PR Interval:  263 QRS Duration: 166 QT Interval:  470 QTC Calculation: 518 R Axis:   -81 Text Interpretation:  Sinus rhythm Prolonged PR interval Right bundle branch block LVH with secondary repolarization abnormality No significant change since last tracing Confirmed by Sandon Yoho  MD-I, Carroll Lingelbach ([id number]) on 07/15/2013 4:01:35 PM            MDM   1. Syncope     Disposition pending   Devoria Albe, MD, Franz Dell, MD 07/15/13 2032

## 2013-07-15 NOTE — H&P (Signed)
History and Physical  Patient ID: Mark French MRN: [id number], DOB: [birth year] Date of Encounter: 07/15/2013, 10:58 PM Primary Physician: [name], MD Primary Cardiologist:   Chief Complaint: sycnope Reason for Admission: syncope.   HPI: 67 yr old male with hx of CHF ( Ef 45 % per Echo 03/14 , 34 % per myoview 03/14 ) , CAD s/p CABG with last LHC in 2013 resulting in  PCI of SVG to Diag ,  Stress test on 11/2011 showing larger infero lateral infarct with no ischemia , HTN , HLD , CKD, PAD here for syncope.   Pt states that he was in usual state of health. Went shopping earlier today. Was seated in a chair and was attempting to wear a ' heavier ' shirt when he suddenly felt dizzy and the next thing he knew he was on the floor. He did not hurt himself. He called EMS for help. Denies any seizures, incontinence. Duration of LOC unknown. No prior hx of syncope. No new medications or dose changes. Denies any orthostatic symptoms. Pt also denies any chest pain , SOB , orthopnea, PND , LE edema ,,claudcation , focal weakness, or bleeding diathesis .  In the ED orthostatic were normal  EKG shows subtle ST depression in V2- V4 which appear new     Past Medical History  Diagnosis Date  . CORONARY ARTERY DISEASE 03/11/2007    s/p CABG 1998;  admitted 08/2011 with an inferior STEMI.  LHC 09/07/11: Severe  Three-vessel CAD, LIMA-LAD patent, SVG-RCA chronically occluded, SVG-circumflex chronically occluded, SVG-diagonal 95%.  PCI: Promus DES to the SVG-diagonal.  Procedure c/b transient CHB req CPR and TTVP;  myoview 4/13:large severe inferolateral infarct, no ischemia, EF 25%    . DIABETES MELLITUS, TYPE I 03/11/2007  . HYPERTENSION 03/11/2007  . 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 HYPERCHOLESTEROLEMIA 09/28/2008  . CERVICAL RADICULOPATHY, LEFT 09/23/2008  . PERIPHERAL NEUROPATHY 09/23/2008  . Chronic systolic heart failure 09/23/2008    Echocardiogram 09/07/11: Difficult study, inferior  and posterior lateral wall hypokinesis, mild LVH, EF 45%, mild LAE.   [name] RENAL INSUFFICIENCY 03/15/2010  . ED (erectile dysfunction)   . Hypogonadism male   . GI bleed 2012    Multiple Duodenal ulcer   . Obesity   . Legally blind     bilaterally  . Stroke 50    age 16 months; residual right sided weakness  . History of blood transfusion 2012  . Charcot foot due to diabetes mellitus     chronic pain  . Osteoarthritis     right hip and shoulder  . COPD (chronic obstructive pulmonary disease)   . Myocardial infarction 2013b Jan  . PERIPHERAL VASCULAR INSUFFICIENCY,  LEGS, BILATERAL 08/17/2010    ABI's performed 04/19/11 R 0.72 L 0.71  . ANEMIA-IRON DEFICIENCY 09/23/2008    ulcer related last year  . Chronic edema     Chronic pedal edema  . Hiatal hernia      Most Recent Cardiac Studies:    Surgical History:  Past Surgical History  Procedure Laterality Date  . Carpal tunnel release      left  . Sigmoidoscopy  03/11/2001  . Venous doppler  01/30/2004  . Tonsillectomy and adenoidectomy      "as a kid"  . Cataract extraction w/ intraocular lens  implant, bilateral    . Coronary angioplasty with stent placement  08/2011    "1"  . Retinopathy surgery  2000's    "laser; both eyes"  .  Wrist fusion  1976    right  . Anterior fusion cervical spine  2010    "C spine; Dr. Yetta Barre"  . Ankle fracture surgery  1976; ?date    LEFT:  fused; removed bulk of hardware  . Heel spur surgery  ~ 2007    ? left  . Irrigation and debridement knee  02/27/2012    Procedure: IRRIGATION AND DEBRIDEMENT KNEE;  Surgeon: Verlee Rossetti, MD;  Location: [facility] OR;  Service: Orthopedics;  Laterality: Right;  irrigation and drainage right knee septic bursitis  . Eye surgery  2008    cataract removal, cautery  . Coronary artery bypass graft  1998    CABG X5  . Fracture surgery      foot broken 1976     Home Meds: Prior to Admission medications   Medication Sig Start Date End Date Taking? Authorizing  Provider  albuterol (PROVENTIL HFA;VENTOLIN HFA) 108 (90 BASE) MCG/ACT inhaler Inhale 2 puffs into the lungs every 6 (six) hours as needed for wheezing or shortness of breath.    Yes Historical Provider, MD  albuterol (PROVENTIL) (2.5 MG/3ML) 0.083% nebulizer solution Take 2.5 mg by nebulization 2 (two) times daily as needed for wheezing or shortness of breath.    Yes Historical Provider, MD  aspirin EC 81 MG tablet Take 81 mg by mouth at bedtime.   Yes Historical Provider, MD  atorvastatin (LIPITOR) 40 MG tablet Take 40 mg by mouth at bedtime.    Yes Historical Provider, MD  budesonide (PULMICORT) 0.25 MG/2ML nebulizer solution Take 0.25 mg by nebulization daily as needed (shortness of breath).    Yes Historical Provider, MD  carvedilol (COREG) 6.25 MG tablet Take 6.25 mg by mouth 2 (two) times daily with a meal.   Yes Historical Provider, MD  clopidogrel (PLAVIX) 75 MG tablet Take 75 mg by mouth daily with breakfast.   Yes Historical Provider, MD  co-enzyme Q-10 30 MG capsule Take 30 mg by mouth daily at 2 PM daily at 2 PM.    Yes Historical Provider, MD  docusate sodium (COLACE) 100 MG capsule Take 100 mg by mouth 2 (two) times daily as needed for constipation.   Yes Historical Provider, MD  gabapentin (NEURONTIN) 600 MG tablet Take 1 tablet (600 mg total) by mouth 3 (three) times daily. 05/13/13  Yes [name], MD  HYDROcodone-acetaminophen (NORCO) 10-325 MG per tablet Take 1-2 tablets by mouth every 4 (four) hours as needed for moderate pain.   Yes Historical Provider, MD  insulin NPH (HUMULIN N,NOVOLIN N) 100 UNIT/ML injection Inject 70 Units into the skin daily before breakfast. 07/14/13  Yes [name], MD  ipratropium (ATROVENT) 0.02 % nebulizer solution Take 500 mcg by nebulization every 6 (six) hours as needed for wheezing or shortness of breath.    Yes Historical Provider, MD  isosorbide mononitrate (IMDUR) 30 MG 24 hr tablet Take 30 mg by mouth daily. 11/20/12 11/20/13 Yes Tonny Bollman,  MD  lactulose, encephalopathy, (GENERLAC) 10 GM/15ML SOLN Take 30 g by mouth daily as needed (constipation).    Yes Historical Provider, MD  losartan (COZAAR) 50 MG tablet Take 50 mg by mouth daily.   Yes Historical Provider, MD  nitroGLYCERIN (NITROSTAT) 0.4 MG SL tablet Place 1 tablet (0.4 mg total) under the tongue every 5 (five) minutes as needed for chest pain. 10/29/12  Yes Tonny Bollman, MD  OVER THE COUNTER MEDICATION Apply 1 application topically daily as needed. Anti fungal cream   Yes Historical Provider,  MD  OVER THE COUNTER MEDICATION Place 1 drop into both eyes daily as needed (dry eyes). [facility] eye drops   Yes Historical Provider, MD  pantoprazole (PROTONIX) 40 MG tablet Take 1 tablet (40 mg total) by mouth daily. 05/13/13  Yes [name], MD  potassium chloride SA (K-DUR,KLOR-CON) 20 MEQ tablet Take 20 mEq by mouth 2 (two) times daily.   Yes Historical Provider, MD  silodosin (RAPAFLO) 8 MG CAPS capsule Take 8 mg by mouth at bedtime.    Yes Historical Provider, MD  spironolactone (ALDACTONE) 25 MG tablet Take 12.5 mg by mouth daily.   Yes Historical Provider, MD  torsemide (DEMADEX) 20 MG tablet Take 60 mg by mouth 2 (two) times daily. Morning and mid afternoon   Yes Historical Provider, MD    Allergies:  Allergies  Allergen Reactions  . Sildenafil Nausea Only and Other (See Comments)    "took it once; heart went to pieces; never took it again"  . Percocet [Oxycodone-Acetaminophen] Other (See Comments)    Unknown reaction-possible altered mental state-per wife.    . Contrast Media [Iodinated Diagnostic Agents] Nausea Only    History   Social History  . Marital Status: Married    Spouse Name: N/A    Number of Children: 0  . Years of Education: N/A   Occupational History  . Disabled    Social History Main Topics  . Smoking status: Never Smoker   . Smokeless tobacco: Never Used  . Alcohol Use: No  . Drug Use: No  . Sexual Activity: No   Other Topics Concern    . Not on file   Social History Narrative   Comes to appointments with wife      0 Caffeine drinks daily      Family History  Problem Relation Age of Onset  . Heart disease Mother     Coronary Artery Disease  . Heart disease Father     Coronary Artery Disease  . Heart disease Paternal Grandmother     Coronary Artery Disease  . Colon cancer Neg Hx   . Diabetes Other     Grandmother    Review of Systems: Per hpi   Labs:   Lab Results  Component Value Date   WBC 10.2 07/15/2013   HGB 12.1* 07/15/2013   HCT 37.4* 07/15/2013   MCV 88.8 07/15/2013   PLT 203 07/15/2013    Recent Labs Lab 07/15/13 1835  NA 135  K 4.1  CL 95*  CO2 31  BUN 21  CREATININE 1.41*  CALCIUM 8.9  PROT 7.1  BILITOT 0.3  ALKPHOS 92  ALT 19  AST 20  GLUCOSE 171*    Recent Labs  07/15/13 1831  TROPONINI <0.30   Lab Results  Component Value Date   CHOL 96 01/12/2012   HDL 45 01/12/2012   LDLCALC 30 01/12/2012   TRIG 107 01/12/2012   Lab Results  Component Value Date   DDIMER 0.65* 11/30/2011    Radiology/Studies:  Ct Head Wo Contrast  07/15/2013   CLINICAL DATA:  Fall, syncope  EXAM: CT HEAD WITHOUT CONTRAST  TECHNIQUE: Contiguous axial images were obtained from the base of the skull through the vertex without intravenous contrast.  COMPARISON:  03/26/2013  FINDINGS: No skull fracture is noted. No intracranial hemorrhage, mass effect or midline shift. Stable periventricular chronic white matter disease. 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 No acute cortical infarction. No mass lesion is noted on this unenhanced scan.  Paranasal sinuses and  mastoid air cells are unremarkable.  IMPRESSION: No acute intracranial abnormality. 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   Electronically Signed   By: Natasha Mead M.D.   On: 07/15/2013 17:43     EKG: NSR, , RBBB, ST depression V2-V4 appears new in c/w EKG from 06/08/2013    Physical Exam: Blood pressure 105/84, pulse 82, temperature 97.6 F (36.4 C),  temperature source Oral, resp. rate 17, SpO2 100.00%. General: Well developed, well nourished, in no acute distress. Head: Normocephalic, atraumatic, sclera non-icteric, no xanthomas, nares are without discharge.  Neck: Negative for carotid bruits. JVD not elevated. Lungs: Clear bilaterally to auscultation without wheezes, rales, or rhonchi. Breathing is unlabored. Heart: RRR with S1 S2. 2/6 systolic murmur appreciated.  Abdomen: Soft, non-tender, non-distended with normoactive bowel sounds. No hepatomegaly. No rebound/guarding. No obvious abdominal masses. Msk:  Strength and tone appear normal for age. Extremities: chronic indurated eryhtamtous appearance of bilateral legs .  Radial pulses are 2+ and equal bilaterally. Psych:  Responds to questions appropriately with a normal affect.    ASSESSMENT AND PLAN:  Syncope  CHF - chronic  CAD s/p CABG and PCI of SVG graft to Dx    Plan  Monitor on Tele for any arrythmia  Pt has risk factors for this ( scar, low EF )  Rule out ACS  . Cont Aspirin , statin, plavix   Check Echocardiogram ( to ascertain LV function status since major difference in myoview and pior echo LV function eval )     Signed, Deborah Chalk, A M.D  07/15/2013, 10:58 PM

## 2013-07-15 NOTE — ED Notes (Signed)
Cardiologist at bedside.  

## 2013-07-15 NOTE — ED Notes (Signed)
PT reports falling out of chair because he passed out .

## 2013-07-15 NOTE — ED Provider Notes (Signed)
8:30 PM = Received sign-out from Dr. Devoria Albe MD to await call from cardiology for disposition of a syncopal episode today (observation vs discharge).  Patient had negative troponin x 2.  No chest pain or SOB.  Patient had ST depression in leads V2 and V3 in route to ED however this has resolved.  Will await recommendations from cardiology.    Consults: 10:00 PM = spoke with Dr. Walker Kehr who is coming to the emergency department to speak with the patient. He states that based upon the story and the patient's past medical history, the patient likely will need admission for observation. Unclear whether the patient will go to the hospitalist or cardiology service.   Patient admitted under cardiology service for further evaluation of his syncopal episode.  Spoke with patient who is in agreement with admission and plan.    Jillyn Ledger, PA-C 07/16/13 [phone]

## 2013-07-16 ENCOUNTER — Observation Stay (HOSPITAL_COMMUNITY): Payer: Medicare Other

## 2013-07-16 ENCOUNTER — Encounter (HOSPITAL_COMMUNITY): Payer: Self-pay | Admitting: Physician Assistant

## 2013-07-16 DIAGNOSIS — R55 Syncope and collapse: Secondary | ICD-10-CM

## 2013-07-16 DIAGNOSIS — I517 Cardiomegaly: Secondary | ICD-10-CM

## 2013-07-16 LAB — CBC
Hemoglobin: 11.5 g/dL — ABNORMAL LOW (ref 13.0–17.0)
MCH: 28.4 pg (ref 26.0–34.0)
MCHC: 32.1 g/dL (ref 30.0–36.0)
Platelets: 191 10*3/uL (ref 150–400)
RBC: 4.05 MIL/uL — ABNORMAL LOW (ref 4.22–5.81)

## 2013-07-16 LAB — COMPREHENSIVE METABOLIC PANEL
ALT: 16 U/L (ref 0–53)
CO2: 30 mEq/L (ref 19–32)
Calcium: 8.5 mg/dL (ref 8.4–10.5)
Chloride: 98 mEq/L (ref 96–112)
Creatinine, Ser: 1.53 mg/dL — ABNORMAL HIGH (ref 0.50–1.35)
GFR calc Af Amer: 52 mL/min — ABNORMAL LOW (ref >90)
GFR calc non Af Amer: 45 mL/min — ABNORMAL LOW (ref >90)
Glucose, Bld: 188 mg/dL — ABNORMAL HIGH (ref 70–99)
Total Bilirubin: 0.2 mg/dL — ABNORMAL LOW (ref 0.3–1.2)

## 2013-07-16 LAB — GLUCOSE, CAPILLARY: Glucose-Capillary: 132 mg/dL — ABNORMAL HIGH (ref 70–99)

## 2013-07-16 MED ORDER — ATORVASTATIN CALCIUM 40 MG PO TABS
40.0000 mg | ORAL_TABLET | Freq: Every day | ORAL | Status: DC
  Filled 2013-07-16: qty 1

## 2013-07-16 MED ORDER — LOSARTAN POTASSIUM 50 MG PO TABS
50.0000 mg | ORAL_TABLET | Freq: Every day | ORAL | Status: DC
  Administered 2013-07-16: 50 mg via ORAL
  Filled 2013-07-16 (×2): qty 1

## 2013-07-16 MED ORDER — ASPIRIN 300 MG RE SUPP
300.0000 mg | RECTAL | Status: AC
  Filled 2013-07-16: qty 1

## 2013-07-16 MED ORDER — HEPARIN SODIUM (PORCINE) 5000 UNIT/ML IJ SOLN
5000.0000 [IU] | Freq: Three times a day (TID) | INTRAMUSCULAR | Status: DC
  Administered 2013-07-16 (×2): 5000 [IU] via SUBCUTANEOUS
  Filled 2013-07-16 (×4): qty 1

## 2013-07-16 MED ORDER — ALBUTEROL SULFATE (5 MG/ML) 0.5% IN NEBU: 2.5000 mg | INHALATION_SOLUTION | Freq: Four times a day (QID) | RESPIRATORY_TRACT | Status: DC | PRN

## 2013-07-16 MED ORDER — ALBUTEROL SULFATE HFA 108 (90 BASE) MCG/ACT IN AERS
2.0000 | INHALATION_SPRAY | Freq: Four times a day (QID) | RESPIRATORY_TRACT | Status: DC | PRN
  Filled 2013-07-16: qty 6.7

## 2013-07-16 MED ORDER — HYDROCODONE-ACETAMINOPHEN 10-325 MG PO TABS
1.0000 | ORAL_TABLET | ORAL | Status: DC | PRN
  Administered 2013-07-16: 1 via ORAL
  Administered 2013-07-16: 2 via ORAL
  Administered 2013-07-16: 1 via ORAL
  Filled 2013-07-16: qty 2
  Filled 2013-07-16 (×2): qty 1

## 2013-07-16 MED ORDER — POTASSIUM CHLORIDE CRYS ER 20 MEQ PO TBCR
20.0000 meq | EXTENDED_RELEASE_TABLET | Freq: Two times a day (BID) | ORAL | Status: DC
  Administered 2013-07-16: 20 meq via ORAL
  Filled 2013-07-16 (×2): qty 1

## 2013-07-16 MED ORDER — IPRATROPIUM BROMIDE 0.02 % IN SOLN: 500.0000 ug | Freq: Four times a day (QID) | RESPIRATORY_TRACT | Status: DC | PRN

## 2013-07-16 MED ORDER — TAMSULOSIN HCL 0.4 MG PO CAPS
0.4000 mg | ORAL_CAPSULE | Freq: Every day | ORAL | Status: DC
  Administered 2013-07-16: 0.4 mg via ORAL
  Filled 2013-07-16 (×2): qty 1

## 2013-07-16 MED ORDER — ASPIRIN EC 81 MG PO TBEC
81.0000 mg | DELAYED_RELEASE_TABLET | Freq: Every day | ORAL | Status: DC
  Filled 2013-07-16: qty 1

## 2013-07-16 MED ORDER — GABAPENTIN 600 MG PO TABS
600.0000 mg | ORAL_TABLET | Freq: Three times a day (TID) | ORAL | Status: DC
  Administered 2013-07-16 (×2): 600 mg via ORAL
  Filled 2013-07-16 (×4): qty 1

## 2013-07-16 MED ORDER — INSULIN NPH (HUMAN) (ISOPHANE) 100 UNIT/ML ~~LOC~~ SUSP
70.0000 [IU] | Freq: Every day | SUBCUTANEOUS | Status: DC
  Administered 2013-07-16: 70 [IU] via SUBCUTANEOUS
  Filled 2013-07-16: qty 10

## 2013-07-16 MED ORDER — IPRATROPIUM BROMIDE 0.02 % IN SOLN
0.5000 mg | Freq: Four times a day (QID) | RESPIRATORY_TRACT | Status: DC | PRN
Start: 2013-07-16 — End: 2013-07-16

## 2013-07-16 MED ORDER — CLOPIDOGREL BISULFATE 75 MG PO TABS
75.0000 mg | ORAL_TABLET | Freq: Every day | ORAL | Status: DC
  Administered 2013-07-16: 75 mg via ORAL
  Filled 2013-07-16 (×2): qty 1

## 2013-07-16 MED ORDER — DOCUSATE SODIUM 100 MG PO CAPS: 100.0000 mg | ORAL_CAPSULE | Freq: Two times a day (BID) | ORAL | Status: DC | PRN

## 2013-07-16 MED ORDER — BUDESONIDE 0.25 MG/2ML IN SUSP
0.2500 mg | Freq: Every day | RESPIRATORY_TRACT | Status: DC | PRN
  Filled 2013-07-16: qty 2

## 2013-07-16 MED ORDER — ASPIRIN 81 MG PO CHEW
324.0000 mg | CHEWABLE_TABLET | ORAL | Status: AC
  Administered 2013-07-16: 324 mg via ORAL
  Filled 2013-07-16 (×2): qty 4

## 2013-07-16 MED ORDER — NITROGLYCERIN 0.4 MG SL SUBL: 0.4000 mg | SUBLINGUAL_TABLET | SUBLINGUAL | Status: DC | PRN

## 2013-07-16 MED ORDER — PANTOPRAZOLE SODIUM 40 MG PO TBEC
40.0000 mg | DELAYED_RELEASE_TABLET | Freq: Every day | ORAL | Status: DC
  Administered 2013-07-16: 40 mg via ORAL
  Filled 2013-07-16: qty 1

## 2013-07-16 MED ORDER — CARVEDILOL 6.25 MG PO TABS
6.2500 mg | ORAL_TABLET | Freq: Two times a day (BID) | ORAL | Status: DC
  Administered 2013-07-16 (×2): 6.25 mg via ORAL
  Filled 2013-07-16 (×3): qty 1

## 2013-07-16 MED ORDER — TORSEMIDE 20 MG PO TABS
60.0000 mg | ORAL_TABLET | Freq: Two times a day (BID) | ORAL | Status: DC
  Administered 2013-07-16: 60 mg via ORAL
  Filled 2013-07-16 (×3): qty 3

## 2013-07-16 MED ORDER — ISOSORBIDE MONONITRATE ER 30 MG PO TB24
30.0000 mg | ORAL_TABLET | Freq: Every day | ORAL | Status: DC
  Administered 2013-07-16: 30 mg via ORAL
  Filled 2013-07-16 (×2): qty 1

## 2013-07-16 MED ORDER — SPIRONOLACTONE 12.5 MG HALF TABLET
12.5000 mg | ORAL_TABLET | Freq: Every day | ORAL | Status: DC
  Administered 2013-07-16: 12.5 mg via ORAL
  Filled 2013-07-16 (×2): qty 1

## 2013-07-16 NOTE — Progress Notes (Signed)
    Subjective:  Left shoulder hurts. Injured it when he fell. Otherwise feels fine. No chest pain, shortness of breath, or recurrent dizziness.  Objective:  Vital Signs in the last 24 hours: Temp:  [97.6 F (36.4 C)-97.9 F (36.6 C)] 97.9 F (36.6 C) (12/10 0136) Pulse Rate:  [57-84] 65 (12/10 0136) Resp:  [13-21] 19 (12/10 0136) BP: (97-142)/(39-84) 134/73 mmHg (12/10 0136) SpO2:  [96 %-100 %] 100 % (12/10 0136) Weight:  [230 lb 12.8 oz (104.69 kg)] 230 lb 12.8 oz (104.69 kg) (12/10 0136)  Intake/Output from previous day: 12/09 0701 - 12/10 0700 In: 240 [P.O.:240] Out: -   Physical Exam: Pt is alert and oriented, chronically ill-appearing male in NAD HEENT: normal Neck: JVP - normal Lungs: CTA bilaterally CV: RRR without murmur or gallop Abd: soft, NT, Positive BS, obese Ext: diffuse leg edema (better than baseline) with venous stasis ulcerations Skin: extensive stasis changes with ulceration  Lab Results:  Recent Labs  07/15/13 1835 07/16/13 0245  WBC 10.2 8.3  HGB 12.1* 11.5*  PLT 203 191    Recent Labs  07/15/13 1835 07/16/13 0245  NA 135 138  K 4.1 3.8  CL 95* 98  CO2 31 30  GLUCOSE 171* 188*  BUN 21 22  CREATININE 1.41* 1.53*    Recent Labs  07/16/13 0250 07/16/13 0904  TROPONINI <0.30 <0.30    Cardiac Studies: 2D echo pending  Tele: Sinus rhythm  Assessment/Plan:  1. Syncope 2. Chronic systolic heart failure 3. CAD s/p CABG, hx MI 4. Chronic stasis ulceration  I think syncope was likely hemodynamic related (hypotension) rather than arrhythmic. Reasonable to recheck 2 D echo. Will XRay left shoulder to rule out fracture. His volume status is challenging to manage and I would not reduce diuretics or antihypertensive meds as BP well-controlled. Encourage regular fluid intake and frequent small meals. OK to discharge home this afternoon after echo and XRay completed.  Tonny Bollman, M.D. 07/16/2013, 11:27 AM

## 2013-07-16 NOTE — ED Provider Notes (Signed)
I was available for consult during the completion of this patient's care.  Gerhard Munch, MD 07/16/13 [phone]

## 2013-07-16 NOTE — Progress Notes (Signed)
  Echocardiogram 2D Echocardiogram has been performed.  Cathie Beams 07/16/2013, 12:51 PM

## 2013-07-16 NOTE — Progress Notes (Signed)
Pt is being discharged home. Wife is at bedside to transport the patient home. Pt has been provided with discharge instructions. RN answered all questions the wife and patient had.

## 2013-07-16 NOTE — Discharge Summary (Signed)
Discharge Summary   Patient ID: Mark French MRN: [id number], DOB/AGE: [birth year] 67 y.o. Admit date: 07/15/2013 D/C date:     07/16/2013  Primary Care Provider: [name], MD Primary Cardiologist: Mark French  Primary Discharge Diagnoses:  1. Syncope, suspected due to hypotension 2. Chronic systolic CHF - EF 40% by echo this admission (reviewed by Dr. Jens French) 3. CAD  - ruled out for MI - a. s/p CABG 1998. b. 08/2011: inferior STEMI.  LHC 09/07/11: Severe 3v CAD, LIMA-LAD patent, SVG-RCA chronically occluded, SVG-circumflex chronically occluded, SVG-diagonal 95%. PCI: Promus DES to the SVG-diagonal.  Procedure c/b transient CHB req CPR and TTVP. c. Myoview 10/2012: scar but no ischemia 4. Chronic stasis ulceration 5. CKD stage III  Secondary Discharge Diagnoses:  Past Medical History  Diagnosis Date  . DIABETES MELLITUS, TYPE I   . HYPERTENSION   . 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 HYPERCHOLESTEROLEMIA   . CERVICAL RADICULOPATHY, LEFT   . PERIPHERAL NEUROPATHY   . RENAL INSUFFICIENCY 03/15/2010  . ED (erectile dysfunction)   . Hypogonadism male   . GI bleed 2012    Multiple Duodenal ulcer   . Obesity   . Legally blind     bilaterally  . Stroke 17    age 85 months; residual right sided weakness  . History of blood transfusion 2012  . Charcot foot due to diabetes mellitus     chronic pain  . Osteoarthritis     right hip and shoulder  . COPD (chronic obstructive pulmonary disease)   . PERIPHERAL VASCULAR INSUFFICIENCY,  LEGS, BILATERAL 08/17/2010    ABI's performed 04/19/11 R 0.72 L 0.71  . ANEMIA-IRON DEFICIENCY 09/23/2008    ulcer related last year  . Chronic edema     Chronic pedal edema  . Hiatal hernia     Hospital Course: Mr. Mark French is a 67 y/o M with history of chronic systolic CHF with variable EF, CAD s/p CABG, HTN, HL, CKD, PAD who presented to [facility] overnight 07/15/2013 with syncope. He was in his usual state of health shopping on  07/14/13. He was seated in a chair and was attempting to wear a 'heavier' shirt when he suddenly felt dizzy and the next thing he knew he was on the floor. He did not hurt himself. He called EMS for help. Duration of LOC was unknown and no prior h/o syncope. No recent med changes. He denied any seizures, incontinence, chest pain, SOB, orthopnea, PND, LE edema, ,claudcation, focal weakness, or bleeding diathesis. In the ER, orthostatics were normal. EKG showed subtle ST depression in V2-V4 that were new. CT head show acute intracranial abnormality; stable old infarct left parietal lobe. Left shoulder film did not show any acute fracture. Troponins remained negative. Dr. Excell French felt that his syncope was likely hemodynamic related (hypotension) rather than arrhythmic. 2D Echo was obtained showing mildly dilated LV with EF 35-40% and severe inferior hypokinesis. The inferior WMA was not new. I had Dr. Jens French review the echo who felt the EF was closer to 40%. Prior EF assessments were 45-50% in 10/2012 by echo and 34% by nuc in 10/2012 (scar but no ischemia). The patient will follow up closely with Dr. Excell French in clinic and was advised not to drive until cleared by his cardiologist. Dr. Excell French did not advise any med changes at present as his volume status has been challenging to manage in the past. He encouraged regular fluid intake and frequent small meals. I have left a message on  our office's scheduling voicemail requesting a follow-up appointment, and our office will call the patient with this appointment.   Discharge Vitals: Blood pressure 134/73, pulse 65, temperature 97.9 F (36.6 C), temperature source Oral, resp. rate 19, height 5\' 1"  (1.549 m), weight 230 lb 12.8 oz (104.69 kg), SpO2 100.00%.  Labs: Lab Results  Component Value Date   WBC 8.3 07/16/2013   HGB 11.5* 07/16/2013   HCT 35.8* 07/16/2013   MCV 88.4 07/16/2013   PLT 191 07/16/2013     Recent Labs Lab 07/16/13 0245  NA 138  K 3.8  CL  98  CO2 30  BUN 22  CREATININE 1.53*  CALCIUM 8.5  PROT 6.4  BILITOT 0.2*  ALKPHOS 80  ALT 16  AST 15  GLUCOSE 188*    Recent Labs  07/15/13 1831 07/16/13 0250 07/16/13 0904 07/16/13 1525  TROPONINI <0.30 <0.30 <0.30 <0.30   Lab Results  Component Value Date   CHOL 96 01/12/2012   HDL 45 01/12/2012   LDLCALC 30 01/12/2012   TRIG 107 01/12/2012   Diagnostic Studies/Procedures   Ct Head Wo Contrast 07/15/2013   CLINICAL DATA:  Fall, syncope  EXAM: CT HEAD WITHOUT CONTRAST  TECHNIQUE: Contiguous axial images were obtained from the base of the skull through the vertex without intravenous contrast.  COMPARISON:  03/26/2013  FINDINGS: No skull fracture is noted. No intracranial hemorrhage, mass effect or midline shift. Stable periventricular chronic white matter disease. 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 No acute cortical infarction. No mass lesion is noted on this unenhanced scan.  Paranasal sinuses and mastoid air cells are unremarkable.  IMPRESSION: No acute intracranial abnormality. 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   Electronically Signed   By: Mark French M.D.   On: 07/15/2013 17:43   Dg Shoulder Left 07/16/2013   CLINICAL DATA:  Left shoulder pain status post fall. The patient could not tolerate the axial view  EXAM: LEFT SHOULDER - 2+ VIEW  COMPARISON:  None.  FINDINGS: The proximal humerus appears intact. As best as can be determined the bony glenoid and acromion and clavicle are intact. There are no findings to suggest dislocation. The observed portions of the upper left ribs appear intact.  IMPRESSION: No acute fracture or dislocation is demonstrated on this limited two view series. If there remain strong clinical concerns of an occult fracture or dislocation, CT scanning would be a useful next step.   Electronically Signed   By: Mark  French   On: 07/16/2013 15:26   Discharge Medications     Medication List         albuterol 108 (90 BASE) MCG/ACT inhaler  Commonly  known as:  PROVENTIL HFA;VENTOLIN HFA  Inhale 2 puffs into the lungs every 6 (six) hours as needed for wheezing or shortness of breath.     albuterol (2.5 MG/3ML) 0.083% nebulizer solution  Commonly known as:  PROVENTIL  Take 2.5 mg by nebulization 2 (two) times daily as needed for wheezing or shortness of breath.     aspirin EC 81 MG tablet  Take 81 mg by mouth at bedtime.     atorvastatin 40 MG tablet  Commonly known as:  LIPITOR  Take 40 mg by mouth at bedtime.     budesonide 0.25 MG/2ML nebulizer solution  Commonly known as:  PULMICORT  Take 0.25 mg by nebulization daily as needed (shortness of breath).     carvedilol 6.25 MG tablet  Commonly known as:  COREG  Take 6.25  mg by mouth 2 (two) times daily with a meal.     clopidogrel 75 MG tablet  Commonly known as:  PLAVIX  Take 75 mg by mouth daily with breakfast.     co-enzyme Q-10 30 MG capsule  Take 30 mg by mouth daily at 2 PM daily at 2 PM.     docusate sodium 100 MG capsule  Commonly known as:  COLACE  Take 100 mg by mouth 2 (two) times daily as needed for constipation.     gabapentin 600 MG tablet  Commonly known as:  NEURONTIN  Take 1 tablet (600 mg total) by mouth 3 (three) times daily.     GENERLAC 10 GM/15ML Soln  Generic drug:  lactulose (encephalopathy)  Take 30 g by mouth daily as needed (constipation).     HYDROcodone-acetaminophen 10-325 MG per tablet  Commonly known as:  NORCO  Take 1-2 tablets by mouth every 4 (four) hours as needed for moderate pain.     insulin NPH 100 UNIT/ML injection  Commonly known as:  HUMULIN N,NOVOLIN N  Inject 70 Units into the skin daily before breakfast.     ipratropium 0.02 % nebulizer solution  Commonly known as:  ATROVENT  Take 500 mcg by nebulization every 6 (six) hours as needed for wheezing or shortness of breath.     isosorbide mononitrate 30 MG 24 hr tablet  Commonly known as:  IMDUR  Take 30 mg by mouth daily.     losartan 50 MG tablet  Commonly known  as:  COZAAR  Take 50 mg by mouth daily.     nitroGLYCERIN 0.4 MG SL tablet  Commonly known as:  NITROSTAT  Place 1 tablet (0.4 mg total) under the tongue every 5 (five) minutes as needed for chest pain.     OVER THE COUNTER MEDICATION  Apply 1 application topically daily as needed. Anti fungal cream     OVER THE COUNTER MEDICATION  Place 1 drop into both eyes daily as needed (dry eyes). [facility] eye drops     pantoprazole 40 MG tablet  Commonly known as:  PROTONIX  Take 1 tablet (40 mg total) by mouth daily.     potassium chloride SA 20 MEQ tablet  Commonly known as:  K-DUR,KLOR-CON  Take 20 mEq by mouth 2 (two) times daily.     RAPAFLO 8 MG Caps capsule  Generic drug:  silodosin  Take 8 mg by mouth at bedtime.     spironolactone 25 MG tablet  Commonly known as:  ALDACTONE  Take 12.5 mg by mouth daily.     torsemide 20 MG tablet  Commonly known as:  DEMADEX  Take 60 mg by mouth 2 (two) times daily. Morning and mid afternoon        Disposition   The patient will be discharged in stable condition to home. Discharge Orders   Future Appointments Provider Department Dept Phone   08/13/2013 9:45 AM Tonny Bollman, MD [facility] Office [phone]   08/13/2013 1:45 PM Hart Carwin, MD [facility] [phone]   Future Orders Complete By Expires   Diet - low sodium heart healthy  As directed    Increase activity slowly  As directed    Scheduling Instructions:     Do not drive until cleared by your cardiologist. Dr. Excell French advises regular moderate fluid intake and frequent small meals.  If you have another episode, seek medical attention immediately.     Follow-up Information   Follow  up with Tonny Bollman, MD. (Our office will call you for a follow-up appointment. Please call the office if you have not heard from [name] within 3 days.)    Specialty:  Cardiology   Contact information:   [address] [place]  [address] [phone]         Duration of Discharge Encounter: Greater than 30 minutes including physician and PA time.  Signed, Ronie Spies PA-C 07/16/2013, 5:58 PM

## 2013-07-17 ENCOUNTER — Other Ambulatory Visit: Payer: Self-pay | Admitting: Cardiovascular Disease

## 2013-07-18 ENCOUNTER — Telehealth: Payer: Self-pay

## 2013-07-18 MED ORDER — HYDROCODONE-ACETAMINOPHEN 10-325 MG PO TABS: 1.0000 | ORAL_TABLET | ORAL | Status: DC | PRN

## 2013-07-18 NOTE — Telephone Encounter (Signed)
i printed 

## 2013-07-18 NOTE — Telephone Encounter (Signed)
Patient informed. Script placed upfront for pick up.

## 2013-07-18 NOTE — Telephone Encounter (Signed)
Patients wife called and stated that patient needs a refill on Hydrocodone. States that he has a dew left but not enough to last the weekend. Patient was last seen on 06/09/2013.

## 2013-07-21 ENCOUNTER — Telehealth: Payer: Self-pay | Admitting: Cardiovascular Disease

## 2013-07-21 NOTE — Telephone Encounter (Signed)
Ok for extra dose of torsemide today per reply from Dr. Excell Seltzer. Called and spoke with patient who states he feels much better than he did this morning and he did not take any extra doses of torsemide. Pt still needs earlier follow up appointment. Pt wants me to speak with his wife, who is at work at [phone]. Appointment made with Norma Fredrickson for tomorrow

## 2013-07-21 NOTE — Telephone Encounter (Signed)
No response from Dr Excell Seltzer as of this time.  Call back to patient to see how he is feeling.  No answer at home. Tried mobile number, no voicemail set up to leave message.

## 2013-07-21 NOTE — Telephone Encounter (Signed)
Patients wife wants to know if he can take an extra dose of torsemide today because at church yesterday he ate dinner and they think it was too much salt for him.  He is "a little more sob than normal and a little anxious". Pt takes 60mg  bid of torsemide. His weight is 233.4.  Last weight was on 07/18/13 and was 230.0 per hospital dc summary. He was hospitalized for a syncopal episode and discharged on 12/12. Message sent to Dr. Excell Seltzer to advise.  Also, pt needs a follow up appointment post hospitalization. Spoke with Dr. Earmon Phoenix scheduler, [facility] who will schedule him.

## 2013-07-21 NOTE — Telephone Encounter (Signed)
New message     Yesterday ate at the church and wife thinks he ate a lot of salty food--this am he is short of breathe and she want to know if he can take an extra fluid pill?

## 2013-07-22 ENCOUNTER — Other Ambulatory Visit: Payer: Self-pay | Admitting: *Deleted

## 2013-07-22 ENCOUNTER — Other Ambulatory Visit: Payer: Self-pay | Admitting: Endocrinology

## 2013-07-22 ENCOUNTER — Ambulatory Visit (INDEPENDENT_AMBULATORY_CARE_PROVIDER_SITE_OTHER): Payer: Medicare Other | Admitting: Nurse Practitioner

## 2013-07-22 ENCOUNTER — Encounter: Payer: Self-pay | Admitting: Nurse Practitioner

## 2013-07-22 VITALS — BP 100/70 | HR 69 | Ht 61.0 in | Wt 229.8 lb

## 2013-07-22 DIAGNOSIS — I5022 Chronic systolic (congestive) heart failure: Secondary | ICD-10-CM

## 2013-07-22 NOTE — Patient Instructions (Addendum)
Stay on your current medicines  Weigh yourself each morning and record.  Take extra dose of diuretic for weight gain of 3 pounds in 24 hours.   Keep limiting sodium intake. Goal is to have less than 2000 mg (2gm) of salt per day.  Stop the sodas  We need to get an arterial doppler study on his legs to check the blood flow  See Dr. Excell Seltzer in 3 weeks as planned  Call the [facility] office at [phone] if you have any questions, problems or concerns.

## 2013-07-22 NOTE — Telephone Encounter (Signed)
Request for proair refill. Medication was D/C on 04/26/2012 and patient was last seen on 06/26/2013.   Ok to refill,  Thanks!

## 2013-07-22 NOTE — Progress Notes (Signed)
Mark French Date of Birth: [birth year] Medical Record #[id number]  History of Present Illness: Mark French is seen back today for a post hospital visit. Seen for Mark French. He is a 67 year old male with multiple issues. These include chronic systolic HF with EF of 40%, CAD with remote CABG in 1998, inferior STEMI in 08/2011 with DES to the SVG to the DX - with patent LIMA to LAD, chronically occluded SVG to RCA, chronically occluded SVG to LCX and 95% stenosis in the SVG to the DX - associated with transient CHB requiring CPR and TTVP. Negative Myoview in 10/2012 - no ischemia but with scar. Other issues include DM, HTN, HLD, CKD, ED, chronic stasis ulceration, legally blind, remote stroke at age 35, charcot foot, OA, COPD, PVD, iron deficiency anemia and hiatal hernia.   Last seen here at the end of November - has had numerous ER visits for SOB.  Most recently in the hospital with syncope - he had been in his usual state of health - was sitting in a chair - attempted to wear a "heavier" shirt when he became dizzy and then had LOC. No injury. Mark French felt that this was related to hemodynamic related hypotension rather than arrhythmia. Echo was updated. EF closer to 40% per Mark French evaluation. His heart failure has apparently been challenging to manage.   Called yesterday due to weight gain - did not end up taking extra diuretic. Probably had too much salt from a church dinner.   Comes back today. Here with his wife. He is in a motorized scooter. He is legally blind. Does not drive. Still a little dizzy at times. Breathing is back to "his normal". Feels like he is doing ok. Tries to limit salt. Likes to drink soda - not interested in stopping. Chronic pain issues with his back/legs/feet. Has a nonhealing ulcer on the lower right leg - seeing the podiatrist tomorrow. Wife notes that this ulcer is NOT healing. No recent ABIs noted. No chest pain. Pretty immobile and sedentary.   Current  Outpatient Prescriptions  Medication Sig Dispense Refill  . albuterol (PROVENTIL HFA;VENTOLIN HFA) 108 (90 BASE) MCG/ACT inhaler Inhale 2 puffs into the lungs every 6 (six) hours as needed for wheezing or shortness of breath.       [name] albuterol (PROVENTIL) (2.5 MG/3ML) 0.083% nebulizer solution Take 2.5 mg by nebulization 2 (two) times daily as needed for wheezing or shortness of breath.       [name] aspirin EC 81 MG tablet Take 81 mg by mouth at bedtime.      [name] atorvastatin (LIPITOR) 40 MG tablet Take 40 mg by mouth at bedtime.       . budesonide (PULMICORT) 0.25 MG/2ML nebulizer solution Take 0.25 mg by nebulization daily as needed (shortness of breath).       . carvedilol (COREG) 6.25 MG tablet Take 6.25 mg by mouth 2 (two) times daily with a meal.      . clopidogrel (PLAVIX) 75 MG tablet Take 75 mg by mouth daily with breakfast.      . co-enzyme Q-10 30 MG capsule Take 30 mg by mouth daily at 2 PM daily at 2 PM.       . docusate sodium (COLACE) 100 MG capsule Take 100 mg by mouth 2 (two) times daily as needed for constipation.      . gabapentin (NEURONTIN) 600 MG tablet Take 1 tablet (600 mg total) by mouth 3 (three) times daily.  90 tablet  3  . HYDROcodone-acetaminophen (NORCO) 10-325 MG per tablet Take 1-2 tablets by mouth every 4 (four) hours as needed for moderate pain.  120 tablet  0  . insulin NPH (HUMULIN N,NOVOLIN N) 100 UNIT/ML injection Inject 70 Units into the skin daily before breakfast.  2 vial  2  . ipratropium (ATROVENT) 0.02 % nebulizer solution Take 500 mcg by nebulization every 6 (six) hours as needed for wheezing or shortness of breath.       . isosorbide mononitrate (IMDUR) 30 MG 24 hr tablet TAKE 1 TABLET BY MOUTH EVERY DAY  30 tablet  0  . lactulose, encephalopathy, (GENERLAC) 10 GM/15ML SOLN Take 30 g by mouth daily as needed (constipation).       [name] losartan (COZAAR) 50 MG tablet Take 50 mg by mouth daily.      . nitroGLYCERIN (NITROSTAT) 0.4 MG SL tablet Place 1 tablet (0.4  mg total) under the tongue every 5 (five) minutes as needed for chest pain.  25 tablet  5  . OVER THE COUNTER MEDICATION Apply 1 application topically daily as needed. Anti fungal cream      . OVER THE COUNTER MEDICATION Place 1 drop into both eyes daily as needed (dry eyes). [facility] eye drops      . pantoprazole (PROTONIX) 40 MG tablet Take 1 tablet (40 mg total) by mouth daily.  30 tablet  3  . potassium chloride SA (K-DUR,KLOR-CON) 20 MEQ tablet Take 20 mEq by mouth 2 (two) times daily.      . silodosin (RAPAFLO) 8 MG CAPS capsule Take 8 mg by mouth at bedtime.       [name] spironolactone (ALDACTONE) 25 MG tablet Take 12.5 mg by mouth daily.      [name] torsemide (DEMADEX) 20 MG tablet Take 60 mg by mouth 2 (two) times daily. Morning and mid afternoon       No current facility-administered medications for this visit.    Allergies  Allergen Reactions  . Sildenafil Nausea Only and Other (See Comments)    "took it once; heart went to pieces; never took it again"  . Percocet [Oxycodone-Acetaminophen] Other (See Comments)    Unknown reaction-possible altered mental state-per wife.    . Contrast Media [Iodinated Diagnostic Agents] Nausea Only    Past Medical History  Diagnosis Date  . CORONARY ARTERY DISEASE     a. s/p CABG 1998. b.  08/2011: inferior STEMI.  LHC 09/07/11: Severe 3v CAD, LIMA-LAD patent, SVG-RCA chronically occluded, SVG-circumflex chronically occluded, SVG-diagonal 95%. PCI: Promus DES to the SVG-diagonal.  Procedure c/b transient CHB req CPR and TTVP. c. Myoview 11/2011: scar but no ischemia.   [name] DIABETES MELLITUS, TYPE I   . HYPERTENSION   . 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 HYPERCHOLESTEROLEMIA   . CERVICAL RADICULOPATHY, LEFT   . PERIPHERAL NEUROPATHY   . Chronic systolic heart failure     Echocardiogram 09/07/11: Difficult study, inferior and posterior lateral wall hypokinesis, mild LVH, EF 45%, mild LAE.   [name] RENAL INSUFFICIENCY 03/15/2010  . ED (erectile  dysfunction)   . Hypogonadism male   . GI bleed 2012    Multiple Duodenal ulcer   . Obesity   . Legally blind     bilaterally  . Stroke 23    age 15 months; residual right sided weakness  . History of blood transfusion 2012  . Charcot foot due to diabetes mellitus     chronic pain  . Osteoarthritis  right hip and shoulder  . COPD (chronic obstructive pulmonary disease)   . PERIPHERAL VASCULAR INSUFFICIENCY,  LEGS, BILATERAL 08/17/2010    ABI's performed 04/19/11 R 0.72 L 0.71  . ANEMIA-IRON DEFICIENCY 09/23/2008    ulcer related last year  . Chronic edema     Chronic pedal edema  . Hiatal hernia   . Chronic stasis dermatitis     Chronic stasis changes.  . CKD (chronic kidney disease), stage III   . Syncope     a. 07/2013: suspected due to hypotension.    Past Surgical History  Procedure Laterality Date  . Carpal tunnel release      left  . Sigmoidoscopy  03/11/2001  . Venous doppler  01/30/2004  . Tonsillectomy and adenoidectomy      "as a kid"  . Cataract extraction w/ intraocular lens  implant, bilateral    . Coronary angioplasty with stent placement  08/2011    "1"  . Retinopathy surgery  2000's    "laser; both eyes"  . Wrist fusion  1976    right  . Anterior fusion cervical spine  2010    "C spine; Dr. Yetta Barre"  . Ankle fracture surgery  1976; ?date    LEFT:  fused; removed bulk of hardware  . Heel spur surgery  ~ 2007    ? left  . Irrigation and debridement knee  02/27/2012    Procedure: IRRIGATION AND DEBRIDEMENT KNEE;  Surgeon: Verlee Rossetti, MD;  Location: [facility] OR;  Service: Orthopedics;  Laterality: Right;  irrigation and drainage right knee septic bursitis  . Eye surgery  2008    cataract removal, cautery  . Coronary artery bypass graft  1998    CABG X5  . Fracture surgery      foot broken 1976    History  Smoking status  . Never Smoker   Smokeless tobacco  . Never Used    History  Alcohol Use No    Family History  Problem Relation Age of  Onset  . Heart disease Mother     Coronary Artery Disease  . Heart disease Father     Coronary Artery Disease  . Heart disease Paternal Grandmother     Coronary Artery Disease  . Colon cancer Neg Hx   . Diabetes Other     Grandmother    Review of Systems: The review of systems is per the HPI.  All other systems were reviewed and are negative.  Physical Exam: BP 100/70  Pulse 69  Ht 5\' 1"  (1.549 m)  Wt 229 lb 12.8 oz (104.237 kg)  BMI 43.44 kg/m2  SpO2 97% Patient is alert and in no acute distress. Morbidly obese. Looks chronically ill.  Skin is warm and dry. Color is normal.  HEENT is unremarkable. Normocephalic/atraumatic. PERRL. Sclera are nonicteric. Neck is supple. No masses. No JVD. Lungs are clear. Cardiac exam shows a regular rate and rhythm. Abdomen is obese but soft. Extremities are with chronic edema. Dressing in place to the right lower leg. Left leg red. Gait not tested. No gross neurologic deficits noted.  Wt Readings from Last 3 Encounters:  07/22/13 229 lb 12.8 oz (104.237 kg)  07/16/13 230 lb 12.8 oz (104.69 kg)  07/02/13 230 lb (104.327 kg)     LABORATORY DATA:  Lab Results  Component Value Date   WBC 8.3 07/16/2013   HGB 11.5* 07/16/2013   HCT 35.8* 07/16/2013   PLT 191 07/16/2013   GLUCOSE 188* 07/16/2013   CHOL  96 01/12/2012   TRIG 107 01/12/2012   HDL 45 01/12/2012   LDLDIRECT 158.5 07/19/2010   LDLCALC 30 01/12/2012   ALT 16 07/16/2013   AST 15 07/16/2013   NA 138 07/16/2013   K 3.8 07/16/2013   CL 98 07/16/2013   CREATININE 1.53* 07/16/2013   BUN 22 07/16/2013   CO2 30 07/16/2013   TSH 0.684 03/27/2013   PSA 0.22 04/16/2012   INR 1.14 04/27/2012   HGBA1C 6.4 05/16/2013   MICROALBUR 0.2 04/16/2012   Echo Study Conclusions  Left ventricle: The cavity size was mildly dilated. Wall thickness was normal. Systolic function was moderately reduced. The estimated ejection fraction was in the range of 35% to 40%. Severe hypokinesis of the inferior  myocardium.  Assessment / Plan:  1. Chronic systolic HF - EF of 40% per recent echo - looks to be NYHA III/IV - apparently has been quite hard to manage with volume status - may need to consider [facility] referral versus CHF referral - may even need to consider palliative care - seems to have poor insight into the seriousness of his issues. Overall prognosis looks poor to me. For now, no change in his medicines. Seeing Mark French back in 3 weeks as planned.   2. CAD - no chest pain reported.  3. HTN - BP stable   4. Chronic edema/venous stasis ulcers - need to update his arterial dopplers.   Patient is agreeable to this plan and will call if any problems develop in the interim.   Rosalio Macadamia, RN, ANP-C [facility] [address] [place], [place]  [address]

## 2013-07-29 ENCOUNTER — Encounter (HOSPITAL_COMMUNITY): Payer: Medicare Other

## 2013-07-29 ENCOUNTER — Other Ambulatory Visit: Payer: Self-pay | Admitting: Cardiovascular Disease

## 2013-08-04 ENCOUNTER — Telehealth: Payer: Self-pay

## 2013-08-04 NOTE — Telephone Encounter (Signed)
Pt's wife called requesting a refill on Hydrocodone. Pt was last seen on 06/09/2013 and medication was last filled on 07/18/2013.  Please advise during Dr. George Hugh absence, Thanks!

## 2013-08-04 NOTE — Telephone Encounter (Signed)
He had 120 tablets that should last him 30 days, need to know what this was prescribed for and why he needs extra medication

## 2013-08-04 NOTE — Telephone Encounter (Signed)
Pt states that his pain has increased due falling and injuring his shoulder. Pt states that is the reason that he has increased. Pt states that he has 10 pills left.

## 2013-08-04 NOTE — Telephone Encounter (Signed)
Wife stated that he will be out at the beginning of the month. Pt is not currently out of medication. Pt was put on medication due to Charcot Foot.

## 2013-08-06 ENCOUNTER — Other Ambulatory Visit: Payer: Self-pay | Admitting: *Deleted

## 2013-08-06 MED ORDER — HYDROCODONE-ACETAMINOPHEN 10-325 MG PO TABS: 1.0000 | ORAL_TABLET | ORAL | Status: DC | PRN

## 2013-08-06 NOTE — Telephone Encounter (Signed)
May refill 60 tablets for now, 10 mg every 6 hours as needed, further refills with [name]

## 2013-08-06 NOTE — Telephone Encounter (Signed)
Pt is aware.  

## 2013-08-06 NOTE — Telephone Encounter (Signed)
Wife called stating the past couple of months the pt has been getting refills every 20 days. Wife wanted to know if anything could be done. Pt took last pill today.

## 2013-08-07 ENCOUNTER — Encounter (HOSPITAL_COMMUNITY): Payer: Self-pay | Admitting: Emergency Medicine

## 2013-08-07 ENCOUNTER — Inpatient Hospital Stay (HOSPITAL_COMMUNITY)
Admission: EM | Admit: 2013-08-07 | Discharge: 2013-08-12 | DRG: 242 | Disposition: A | Discharge status: Home / Self Care | Payer: Medicare Other | Attending: Cardiovascular Disease | Admitting: Cardiovascular Disease

## 2013-08-07 ENCOUNTER — Encounter (HOSPITAL_COMMUNITY)
Admission: EM | Disposition: A | Discharge status: Home / Self Care | Payer: Self-pay | Source: Home / Self Care | Attending: Cardiovascular Disease

## 2013-08-07 ENCOUNTER — Emergency Department (HOSPITAL_COMMUNITY): Payer: Medicare Other

## 2013-08-07 DIAGNOSIS — I200 Unstable angina: Secondary | ICD-10-CM

## 2013-08-07 DIAGNOSIS — R001 Bradycardia, unspecified: Secondary | ICD-10-CM

## 2013-08-07 DIAGNOSIS — Z951 Presence of aortocoronary bypass graft: Secondary | ICD-10-CM

## 2013-08-07 DIAGNOSIS — E119 Type 2 diabetes mellitus without complications: Secondary | ICD-10-CM

## 2013-08-07 DIAGNOSIS — N183 Chronic kidney disease, stage 3 unspecified: Secondary | ICD-10-CM

## 2013-08-07 DIAGNOSIS — J810 Acute pulmonary edema: Secondary | ICD-10-CM

## 2013-08-07 DIAGNOSIS — Z950 Presence of cardiac pacemaker: Secondary | ICD-10-CM

## 2013-08-07 DIAGNOSIS — Z8673 Personal history of transient ischemic attack (TIA), and cerebral infarction without residual deficits: Secondary | ICD-10-CM

## 2013-08-07 DIAGNOSIS — N179 Acute kidney failure, unspecified: Secondary | ICD-10-CM | POA: Diagnosis not present

## 2013-08-07 DIAGNOSIS — I509 Heart failure, unspecified: Secondary | ICD-10-CM | POA: Diagnosis present

## 2013-08-07 DIAGNOSIS — R079 Chest pain, unspecified: Secondary | ICD-10-CM

## 2013-08-07 DIAGNOSIS — I959 Hypotension, unspecified: Secondary | ICD-10-CM

## 2013-08-07 DIAGNOSIS — R55 Syncope and collapse: Secondary | ICD-10-CM

## 2013-08-07 DIAGNOSIS — E11359 Type 2 diabetes mellitus with proliferative diabetic retinopathy without macular edema: Secondary | ICD-10-CM | POA: Diagnosis present

## 2013-08-07 DIAGNOSIS — I441 Atrioventricular block, second degree: Secondary | ICD-10-CM

## 2013-08-07 DIAGNOSIS — I2789 Other specified pulmonary heart diseases: Secondary | ICD-10-CM | POA: Diagnosis present

## 2013-08-07 DIAGNOSIS — Z7982 Long term (current) use of aspirin: Secondary | ICD-10-CM

## 2013-08-07 DIAGNOSIS — I5023 Acute on chronic systolic (congestive) heart failure: Secondary | ICD-10-CM

## 2013-08-07 DIAGNOSIS — I5022 Chronic systolic (congestive) heart failure: Secondary | ICD-10-CM

## 2013-08-07 DIAGNOSIS — I452 Bifascicular block: Secondary | ICD-10-CM | POA: Diagnosis present

## 2013-08-07 DIAGNOSIS — N289 Disorder of kidney and ureter, unspecified: Secondary | ICD-10-CM

## 2013-08-07 DIAGNOSIS — I453 Trifascicular block: Secondary | ICD-10-CM | POA: Diagnosis present

## 2013-08-07 DIAGNOSIS — R5381 Other malaise: Secondary | ICD-10-CM | POA: Diagnosis present

## 2013-08-07 DIAGNOSIS — I442 Atrioventricular block, complete: Principal | ICD-10-CM | POA: Diagnosis present

## 2013-08-07 DIAGNOSIS — I25810 Atherosclerosis of coronary artery bypass graft(s) without angina pectoris: Secondary | ICD-10-CM

## 2013-08-07 DIAGNOSIS — E785 Hyperlipidemia, unspecified: Secondary | ICD-10-CM | POA: Diagnosis present

## 2013-08-07 DIAGNOSIS — G609 Hereditary and idiopathic neuropathy, unspecified: Secondary | ICD-10-CM | POA: Diagnosis present

## 2013-08-07 DIAGNOSIS — Z79899 Other long term (current) drug therapy: Secondary | ICD-10-CM

## 2013-08-07 DIAGNOSIS — I2510 Atherosclerotic heart disease of native coronary artery without angina pectoris: Secondary | ICD-10-CM | POA: Diagnosis present

## 2013-08-07 DIAGNOSIS — J449 Chronic obstructive pulmonary disease, unspecified: Secondary | ICD-10-CM | POA: Diagnosis present

## 2013-08-07 DIAGNOSIS — E1039 Type 1 diabetes mellitus with other diabetic ophthalmic complication: Secondary | ICD-10-CM | POA: Diagnosis present

## 2013-08-07 DIAGNOSIS — J4489 Other specified chronic obstructive pulmonary disease: Secondary | ICD-10-CM | POA: Diagnosis present

## 2013-08-07 DIAGNOSIS — I498 Other specified cardiac arrhythmias: Secondary | ICD-10-CM | POA: Diagnosis present

## 2013-08-07 DIAGNOSIS — I2589 Other forms of chronic ischemic heart disease: Secondary | ICD-10-CM | POA: Diagnosis present

## 2013-08-07 DIAGNOSIS — I249 Acute ischemic heart disease, unspecified: Secondary | ICD-10-CM

## 2013-08-07 DIAGNOSIS — I252 Old myocardial infarction: Secondary | ICD-10-CM

## 2013-08-07 DIAGNOSIS — Z794 Long term (current) use of insulin: Secondary | ICD-10-CM

## 2013-08-07 DIAGNOSIS — J9600 Acute respiratory failure, unspecified whether with hypoxia or hypercapnia: Secondary | ICD-10-CM

## 2013-08-07 DIAGNOSIS — G8929 Other chronic pain: Secondary | ICD-10-CM | POA: Diagnosis present

## 2013-08-07 DIAGNOSIS — I459 Conduction disorder, unspecified: Secondary | ICD-10-CM

## 2013-08-07 HISTORY — PX: LEFT HEART CATHETERIZATION WITH CORONARY/GRAFT ANGIOGRAM: SHX5450

## 2013-08-07 HISTORY — PX: TEMPORARY PACEMAKER INSERTION: SHX5471

## 2013-08-07 LAB — COMPREHENSIVE METABOLIC PANEL
ALT: 16 U/L (ref 0–53)
AST: 17 U/L (ref 0–37)
Albumin: 3.3 g/dL — ABNORMAL LOW (ref 3.5–5.2)
Alkaline Phosphatase: 97 U/L (ref 39–117)
BUN: 22 mg/dL (ref 6–23)
CO2: 30 meq/L (ref 19–32)
Calcium: 8.7 mg/dL (ref 8.4–10.5)
Chloride: 98 mEq/L (ref 96–112)
Creatinine, Ser: 1.59 mg/dL — ABNORMAL HIGH (ref 0.50–1.35)
GFR calc Af Amer: 49 mL/min — ABNORMAL LOW (ref >90)
GFR, EST NON AFRICAN AMERICAN: 43 mL/min — AB (ref >90)
GLUCOSE: 147 mg/dL — AB (ref 70–99)
Potassium: 4.5 mEq/L (ref 3.7–5.3)
Sodium: 139 mEq/L (ref 137–147)
Total Bilirubin: 0.3 mg/dL (ref 0.3–1.2)
Total Protein: 7.1 g/dL (ref 6.0–8.3)

## 2013-08-07 LAB — POCT I-STAT 3, ART BLOOD GAS (G3+)
ACID-BASE EXCESS: 5 mmol/L — AB (ref 0.0–2.0)
BICARBONATE: 30 meq/L — AB (ref 20.0–24.0)
O2 Saturation: 100 %
TCO2: 31 mmol/L (ref 0–100)
pCO2 arterial: 44.1 mmHg (ref 35.0–45.0)
pH, Arterial: 7.44 (ref 7.350–7.450)
pO2, Arterial: 195 mmHg — ABNORMAL HIGH (ref 80.0–100.0)

## 2013-08-07 LAB — TROPONIN I: Troponin I: <0.3 ng/mL (ref <0.30)

## 2013-08-07 LAB — CBC
HCT: 36.1 % — ABNORMAL LOW (ref 39.0–52.0)
Hemoglobin: 11.5 g/dL — ABNORMAL LOW (ref 13.0–17.0)
MCH: 28.3 pg (ref 26.0–34.0)
MCHC: 31.9 g/dL (ref 30.0–36.0)
MCV: 88.7 fL (ref 78.0–100.0)
Platelets: 192 10*3/uL (ref 150–400)
RBC: 4.07 MIL/uL — AB (ref 4.22–5.81)
RDW: 14.4 % (ref 11.5–15.5)
WBC: 8 10*3/uL (ref 4.0–10.5)

## 2013-08-07 LAB — GLUCOSE, CAPILLARY: Glucose-Capillary: 201 mg/dL — ABNORMAL HIGH (ref 70–99)

## 2013-08-07 LAB — PRO B NATRIURETIC PEPTIDE: PRO B NATRI PEPTIDE: 568 pg/mL — AB (ref 0–125)

## 2013-08-07 LAB — MAGNESIUM: Magnesium: 2.7 mg/dL — ABNORMAL HIGH (ref 1.5–2.5)

## 2013-08-07 SURGERY — TEMPORARY PACEMAKER INSERTION

## 2013-08-07 MED ORDER — METHYLPREDNISOLONE SODIUM SUCC 125 MG IJ SOLR
INTRAMUSCULAR | Status: AC
  Administered 2013-08-08: 12:00:00 125 mg via INTRAVENOUS
  Filled 2013-08-07: qty 2

## 2013-08-07 MED ORDER — ATROPINE SULFATE 1 MG/ML IJ SOLN: 1.0000 mg | Freq: Once | INTRAMUSCULAR | Status: DC

## 2013-08-07 MED ORDER — CLOPIDOGREL BISULFATE 75 MG PO TABS
75.0000 mg | ORAL_TABLET | Freq: Every day | ORAL | Status: DC
  Administered 2013-08-08 – 2013-08-12 (×5): 75 mg via ORAL
  Filled 2013-08-07 (×6): qty 1

## 2013-08-07 MED ORDER — ATROPINE SULFATE 1 MG/ML IJ SOLN: 0.2000 mg | Freq: Once | INTRAMUSCULAR | Status: DC

## 2013-08-07 MED ORDER — HEPARIN (PORCINE) IN NACL 2-0.9 UNIT/ML-% IJ SOLN
INTRAMUSCULAR | Status: AC
  Filled 2013-08-07: qty 1000

## 2013-08-07 MED ORDER — ASPIRIN 81 MG PO CHEW: 81.0000 mg | CHEWABLE_TABLET | ORAL | Status: DC

## 2013-08-07 MED ORDER — SODIUM CHLORIDE 0.9 % IJ SOLN: 3.0000 mL | INTRAMUSCULAR | Status: DC | PRN

## 2013-08-07 MED ORDER — NITROGLYCERIN IN D5W 200-5 MCG/ML-% IV SOLN
INTRAVENOUS | Status: AC
  Filled 2013-08-07: qty 250

## 2013-08-07 MED ORDER — HEPARIN SODIUM (PORCINE) 5000 UNIT/ML IJ SOLN
5000.0000 [IU] | Freq: Three times a day (TID) | INTRAMUSCULAR | Status: DC
  Administered 2013-08-07 – 2013-08-08 (×2): 5000 [IU] via SUBCUTANEOUS
  Filled 2013-08-07 (×5): qty 1

## 2013-08-07 MED ORDER — SODIUM CHLORIDE 0.9 % IV SOLN: 250.0000 mL | INTRAVENOUS | Status: DC | PRN

## 2013-08-07 MED ORDER — NITROGLYCERIN 0.2 MG/ML ON CALL CATH LAB
INTRAVENOUS | Status: AC
  Filled 2013-08-07: qty 1

## 2013-08-07 MED ORDER — ASPIRIN EC 81 MG PO TBEC
81.0000 mg | DELAYED_RELEASE_TABLET | Freq: Every day | ORAL | Status: DC
  Administered 2013-08-08 – 2013-08-12 (×5): 81 mg via ORAL
  Filled 2013-08-07 (×5): qty 1

## 2013-08-07 MED ORDER — NITROGLYCERIN IN D5W 200-5 MCG/ML-% IV SOLN: 2.0000 ug/min | INTRAVENOUS | Status: DC

## 2013-08-07 MED ORDER — POTASSIUM CHLORIDE 10 MEQ/50ML IV SOLN
10.0000 meq | INTRAVENOUS | Status: AC
  Administered 2013-08-07 – 2013-08-08 (×6): 10 meq via INTRAVENOUS
  Filled 2013-08-07 (×6): qty 50

## 2013-08-07 MED ORDER — LIDOCAINE HCL (PF) 1 % IJ SOLN
INTRAMUSCULAR | Status: AC
  Filled 2013-08-07: qty 30

## 2013-08-07 MED ORDER — ONDANSETRON HCL 4 MG/2ML IJ SOLN: 4.0000 mg | Freq: Four times a day (QID) | INTRAMUSCULAR | Status: DC | PRN

## 2013-08-07 MED ORDER — SPIRONOLACTONE 25 MG PO TABS
25.0000 mg | ORAL_TABLET | Freq: Every day | ORAL | Status: DC
  Administered 2013-08-08 – 2013-08-12 (×5): 25 mg via ORAL
  Filled 2013-08-07 (×6): qty 1

## 2013-08-07 MED ORDER — DIPHENHYDRAMINE HCL 50 MG/ML IJ SOLN
INTRAMUSCULAR | Status: AC
  Administered 2013-08-08: 12:00:00 25 mg via INTRAVENOUS
  Filled 2013-08-07: qty 1

## 2013-08-07 MED ORDER — ASPIRIN 300 MG RE SUPP: 300.0000 mg | RECTAL | Status: DC

## 2013-08-07 MED ORDER — NITROGLYCERIN 0.4 MG SL SUBL: 0.4000 mg | SUBLINGUAL_TABLET | SUBLINGUAL | Status: DC | PRN

## 2013-08-07 MED ORDER — SODIUM CHLORIDE 0.9 % IJ SOLN
3.0000 mL | Freq: Two times a day (BID) | INTRAMUSCULAR | Status: DC
  Administered 2013-08-07 – 2013-08-08 (×2): 3 mL via INTRAVENOUS

## 2013-08-07 MED ORDER — MORPHINE SULFATE 2 MG/ML IJ SOLN
1.0000 mg | INTRAMUSCULAR | Status: DC | PRN
  Administered 2013-08-07 – 2013-08-08 (×3): 2 mg via INTRAVENOUS
  Filled 2013-08-07 (×3): qty 1

## 2013-08-07 MED ORDER — FENTANYL CITRATE 0.05 MG/ML IJ SOLN
INTRAMUSCULAR | Status: AC
  Filled 2013-08-07: qty 2

## 2013-08-07 MED ORDER — ONDANSETRON HCL 4 MG/2ML IJ SOLN
4.0000 mg | Freq: Once | INTRAMUSCULAR | Status: AC
  Administered 2013-08-07: 4 mg via INTRAVENOUS

## 2013-08-07 MED ORDER — FUROSEMIDE 10 MG/ML IJ SOLN
80.0000 mg | Freq: Two times a day (BID) | INTRAMUSCULAR | Status: DC
  Administered 2013-08-07 – 2013-08-08 (×2): 80 mg via INTRAVENOUS
  Filled 2013-08-07 (×4): qty 8

## 2013-08-07 MED ORDER — SODIUM CHLORIDE 0.9 % IV BOLUS (SEPSIS)
500.0000 mL | Freq: Once | INTRAVENOUS | Status: AC
  Administered 2013-08-07: 500 mL via INTRAVENOUS

## 2013-08-07 MED ORDER — ALBUTEROL SULFATE (2.5 MG/3ML) 0.083% IN NEBU
2.5000 mg | INHALATION_SOLUTION | Freq: Two times a day (BID) | RESPIRATORY_TRACT | Status: DC | PRN
  Administered 2013-08-08 – 2013-08-12 (×2): 2.5 mg via RESPIRATORY_TRACT
  Filled 2013-08-07 (×2): qty 3

## 2013-08-07 MED ORDER — SODIUM CHLORIDE 0.9 % IV SOLN
INTRAVENOUS | Status: DC
  Administered 2013-08-07: 23:00:00 via INTRAVENOUS

## 2013-08-07 MED ORDER — SODIUM CHLORIDE 0.9 % IV SOLN
250.0000 mL | INTRAVENOUS | Status: DC | PRN
  Administered 2013-08-08: 13:00:00 via INTRAVENOUS

## 2013-08-07 MED ORDER — MORPHINE SULFATE 10 MG/ML IJ SOLN
INTRAMUSCULAR | Status: AC
  Filled 2013-08-07: qty 1

## 2013-08-07 MED ORDER — ASPIRIN 81 MG PO CHEW: 324.0000 mg | CHEWABLE_TABLET | ORAL | Status: DC

## 2013-08-07 MED ORDER — ATROPINE SULFATE 0.1 MG/ML IJ SOLN
INTRAMUSCULAR | Status: AC
  Filled 2013-08-07: qty 10

## 2013-08-07 MED ORDER — ATORVASTATIN CALCIUM 40 MG PO TABS
40.0000 mg | ORAL_TABLET | Freq: Every day | ORAL | Status: DC
  Administered 2013-08-08 – 2013-08-11 (×4): 40 mg via ORAL
  Filled 2013-08-07 (×5): qty 1

## 2013-08-07 MED ORDER — ACETAMINOPHEN 650 MG RE SUPP: 650.0000 mg | RECTAL | Status: DC | PRN

## 2013-08-07 MED ORDER — SODIUM CHLORIDE 0.9 % IJ SOLN
3.0000 mL | Freq: Two times a day (BID) | INTRAMUSCULAR | Status: DC
  Administered 2013-08-08: 3 mL via INTRAVENOUS

## 2013-08-07 NOTE — ED Notes (Signed)
Cardiology at bedside.

## 2013-08-07 NOTE — ED Notes (Signed)
1mg  of Atropine given.

## 2013-08-07 NOTE — ED Notes (Signed)
Pt reports dizziness that began this after. States he fell hitting his head on a door. Denies LOC.  Pt then became nauseated, has not vomited. Pt lethargic appearance, oriented x4 at present, pale.

## 2013-08-07 NOTE — ED Notes (Signed)
0.5mg  of Atropine given.  Verbal order by Dr. Domenic Polite

## 2013-08-07 NOTE — Progress Notes (Signed)
Orthopedic Tech Progress Note Patient Details:  Mark French [birth year] [id number]  Ortho Devices Type of Ortho Device: Knee Immobilizer Ortho Device/Splint Location: RLE Ortho Device/Splint Interventions: Ordered;Application   Braulio Bosch 08/07/2013, 10:51 PM

## 2013-08-07 NOTE — H&P (Signed)
History and Physical Interval Note:  NAME:  Mark French   MRN: [id number] DOB:  04/17/1946   ADMIT DATE: 08/07/2013   08/07/2013 7:12 PM  AADITYA French is a 68 y.o. male with h/o CAD - (CABG in 1998) with known patent LIMA-LAD but 2 of 3 SVG occluded & STEMI in Jan 2013 leading to PCI of SVG-Diag & complicated by CHB.  He has ischemic CM by Echo& is otehrwise chronically disabled: DM, HTN, HLD, CKD, ED, chronic stasis ulceration, legally blind, prior CVA, OA, COPD, PVD, iron deficiency anemia and hiatal hernia. He is accompanied by wife who gives his history. He has not been feeling well for the last several days. He was recently admitted with syncope and no clear cut cause identified. He now presents with acute worsening SOB and chest pain. This began about 4 hours ago. His wife states they were not sure whether or not to call 911 because he always feels poorly. He has limited mobility and mostly gets around on his motorized scooter.  On arrival to the ED his heart rate was found to be 30 bpm and his SBP 98. He was given a total of 1 mg of atropine and 500 cc bolus of normal saline. He was weak but mentating. During our interview and exam, he developed recurrent chest pain and SOB. He developed complete heart block and was started on IV dopamine. Soon after the dopamine infusion was started, he then had AIVR vs slow VT and dopamine was stopped. He now remains in complete heart block.   He was seen & evaluated by Ileene Hutchinson, PA & Dr. Myles Gip, who felt that he couldpotentially be having ischemic related CHB.  The have referred him for LHC/Nativ & Graft Angiography along with TTVP placement.   Past Medical History  Diagnosis Date  . CORONARY ARTERY DISEASE     a. s/p CABG 1998. b.  08/2011: inferior STEMI.  LHC 09/07/11: Severe 3v CAD, LIMA-LAD patent, SVG-RCA chronically occluded, SVG-circumflex chronically occluded, SVG-diagonal 95%. PCI: Promus DES to the SVG-diagonal.  Procedure c/b  transient CHB req CPR and TTVP. c. Myoview 11/2011: scar but no ischemia.   [name] DIABETES MELLITUS, TYPE I   . HYPERTENSION   . 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 HYPERCHOLESTEROLEMIA   . CERVICAL RADICULOPATHY, LEFT   . PERIPHERAL NEUROPATHY   . Chronic systolic heart failure     Echocardiogram 09/07/11: Difficult study, inferior and posterior lateral wall hypokinesis, mild LVH, EF 45%, mild LAE.   [name] RENAL INSUFFICIENCY 03/15/2010  . ED (erectile dysfunction)   . Hypogonadism male   . GI bleed 2012    Multiple Duodenal ulcer   . Obesity   . Legally blind     bilaterally  . Stroke 11    age 67 months; residual right sided weakness  . History of blood transfusion 2012  . Charcot foot due to diabetes mellitus     chronic pain  . Osteoarthritis     right hip and shoulder  . COPD (chronic obstructive pulmonary disease)   . PERIPHERAL VASCULAR INSUFFICIENCY,  LEGS, BILATERAL 08/17/2010    ABI's performed 04/19/11 R 0.72 L 0.71  . ANEMIA-IRON DEFICIENCY 09/23/2008    ulcer related last year  . Chronic edema     Chronic pedal edema  . Hiatal hernia   . Chronic stasis dermatitis     Chronic stasis changes.  . CKD (chronic kidney disease), stage III   . Syncope  a. 07/2013: suspected due to hypotension.   Past Surgical History  Procedure Laterality Date  . Carpal tunnel release      left  . Sigmoidoscopy  03/11/2001  . Venous doppler  01/30/2004  . Tonsillectomy and adenoidectomy      "as a kid"  . Cataract extraction w/ intraocular lens  implant, bilateral    . Coronary angioplasty with stent placement  08/2011    "1"  . Retinopathy surgery  2000's    "laser; both eyes"  . Wrist fusion  1976    right  . Anterior fusion cervical spine  2010    "C spine; Dr. Ronnald Ramp"  . Ankle fracture surgery  1976; ?date    LEFT:  fused; removed bulk of hardware  . Heel spur surgery  ~ 2007    ? left  . Irrigation and debridement knee  02/27/2012    Procedure: IRRIGATION AND  DEBRIDEMENT KNEE;  Surgeon: Augustin Schooling, MD;  Location: [facility];  Service: Orthopedics;  Laterality: Right;  irrigation and drainage right knee septic bursitis  . Eye surgery  2008    cataract removal, cautery  . Coronary artery bypass graft  1998    CABG X5  . Fracture surgery      foot broken 1976    FAMHx: Family History  Problem Relation Age of Onset  . Heart disease Mother     Coronary Artery Disease  . Heart disease Father     Coronary Artery Disease  . Heart disease Paternal Grandmother     Coronary Artery Disease  . Colon cancer Neg Hx   . Diabetes Other     Grandmother    SOCHx:  reports that he has never smoked. He has never used smokeless tobacco. He reports that he does not drink alcohol or use illicit drugs.  ALLERGIES: Allergies  Allergen Reactions  . Sildenafil Nausea Only and Other (See Comments)    "took it once; heart went to pieces; never took it again"  . Percocet [Oxycodone-Acetaminophen] Other (See Comments)    Unknown reaction-possible altered mental state-per wife.    . Contrast Media [Iodinated Diagnostic Agents] Nausea Only    HOME MEDICATIONS: Prescriptions prior to admission  Medication Sig Dispense Refill  . albuterol (PROVENTIL HFA;VENTOLIN HFA) 108 (90 BASE) MCG/ACT inhaler Inhale 2 puffs into the lungs every 6 (six) hours as needed for wheezing or shortness of breath.       [name] albuterol (PROVENTIL) (2.5 MG/3ML) 0.083% nebulizer solution Take 2.5 mg by nebulization 2 (two) times daily as needed for wheezing or shortness of breath.       [name] aspirin EC 81 MG tablet Take 81 mg by mouth at bedtime.      [name] atorvastatin (LIPITOR) 40 MG tablet Take 40 mg by mouth at bedtime.       . budesonide (PULMICORT) 0.25 MG/2ML nebulizer solution Take 0.25 mg by nebulization daily as needed (shortness of breath).       . carvedilol (COREG) 6.25 MG tablet Take 6.25 mg by mouth 2 (two) times daily with a meal.      . clopidogrel (PLAVIX) 75 MG tablet Take 75 mg  by mouth daily with breakfast.      . co-enzyme Q-10 30 MG capsule Take 30 mg by mouth daily at 2 PM daily at 2 PM.       . docusate sodium (COLACE) 100 MG capsule Take 100 mg by mouth 2 (two) times daily as needed for constipation.      [name]  gabapentin (NEURONTIN) 600 MG tablet Take 1 tablet (600 mg total) by mouth 3 (three) times daily.  90 tablet  3  . HYDROcodone-acetaminophen (NORCO) 10-325 MG per tablet Take 1-2 tablets by mouth every 4 (four) hours as needed for moderate pain.  60 tablet  0  . insulin NPH (HUMULIN N,NOVOLIN N) 100 UNIT/ML injection Inject 70 Units into the skin daily before breakfast.  2 vial  2  . ipratropium (ATROVENT) 0.02 % nebulizer solution Take 500 mcg by nebulization every 6 (six) hours as needed for wheezing or shortness of breath.       . isosorbide mononitrate (IMDUR) 30 MG 24 hr tablet TAKE 1 TABLET BY MOUTH EVERY DAY  30 tablet  0  . lactulose, encephalopathy, (GENERLAC) 10 GM/15ML SOLN Take 30 g by mouth daily as needed (constipation).       [name] losartan (COZAAR) 50 MG tablet Take 50 mg by mouth daily.      [name] losartan (COZAAR) 50 MG tablet TAKE 1 TABLET BY MOUTH EVERY DAY  30 tablet  0  . nitroGLYCERIN (NITROSTAT) 0.4 MG SL tablet Place 1 tablet (0.4 mg total) under the tongue every 5 (five) minutes as needed for chest pain.  25 tablet  5  . OVER THE COUNTER MEDICATION Apply 1 application topically daily as needed. Anti fungal cream      . OVER THE COUNTER MEDICATION Place 1 drop into both eyes daily as needed (dry eyes). [facility] eye drops      . pantoprazole (PROTONIX) 40 MG tablet Take 1 tablet (40 mg total) by mouth daily.  30 tablet  3  . potassium chloride SA (K-DUR,KLOR-CON) 20 MEQ tablet Take 20 mEq by mouth 2 (two) times daily.      [name] PROAIR HFA 108 (90 BASE) MCG/ACT inhaler INHALE 2 PUFFS INTO THE LUNGS EVERY 6 HOURS AS NEEDED FOR WHEEZING  8.5 g  0  . silodosin (RAPAFLO) 8 MG CAPS capsule Take 8 mg by mouth at bedtime.       [name] spironolactone (ALDACTONE)  25 MG tablet Take 12.5 mg by mouth daily.      [name] torsemide (DEMADEX) 20 MG tablet Take 60 mg by mouth 2 (two) times daily. Morning and mid afternoon        PHYSICAL EXAM:Blood pressure 99/32, pulse 92, temperature 98.1 F (36.7 C), temperature source Oral, resp. rate 21, SpO2 100.00%. See full H&P by General Cardiology staff General: Well developed, ill appearing 68 y.o. male in no acute distress.  HEENT: Normocephalic, atraumatic. Sclera nonicteric. Oropharynx clear.  Neck: Supple. No JVD.  Lungs: Respirations regular and unlabored, CTA bilaterally. No wheezes, rales or rhonchi.  Heart: Bradycardic. S1, S2 present. No murmurs, rub, S3 or S4.  Abdomen: Soft, non-distended.  Extremities: No clubbing, cyanosis or edema. DP/Radials equal bilaterally.  Psych: Normal affect.  Neuro: Alert and oriented X 3. Moves all extremities spontaneously.  Musculoskeletal: No kyphosis.  Skin: Intact. Warm and dry. No rashes or petechiae in exposed areas.   IMPRESSION & PLAN The patients' history has been reviewed, patient examined, no change in status from most recent note, stable for surgery. I have reviewed the patients' chart and labs. Questions were answered to the patient's satisfaction.    PARDEEP PAUTZ has presented today for surgery, with the diagnosis of Chest Pain with Complete Heart Block The various methods of treatment have been discussed with the patient and family.   Risks / Complications include, but not limited to: Death, MI,  CVA/TIA, VF/VT (with defibrillation), Bradycardia (need for temporary pacer placement), contrast induced nephropathy, bleeding / bruising / hematoma / pseudoaneurysm, vascular or coronary injury (with possible emergent CT or Vascular Surgery), adverse medication reactions, infection.     After consideration of risks, benefits and other options for treatment, the patient has consented to Procedure(s):   LEFT HEART CATHETERIZATION AND CORONARY ANGIOGRAPHY +/- AD  Mauldin   as a surgical intervention.   Written consent obtained in ER - signed by wife. We will proceed with the planned procedure.   [facility] [place] [place], [place]  [address]  [phone]  08/07/2013 7:12 PM

## 2013-08-07 NOTE — Significant Event (Signed)
  Subjective: Going to check and see how patient is doing, at the time of my arrival to the Cath Lab patient was tachypneic, with altered level of consciousness, agitated in severe respiratory distress, saturation 83% on 100% nonrebreather Blood pressure 165/90 number of mercury, heart rate 65-70 beats per minute mostly V. Paced. Respiratory examination notable for increased work of breathing, diffuse wheezings During the left heart cath LVEDP was noted to be at least 30 mm mercury.   Interventions in the Cath Lab:  - lasix 40 mg IV x1 - Morphine 2 mg IV x1 - Nitroglycerin drip  350 mcg/min per 2 minutes - Patient moved to a hospital bed from the Cath Lab table and put in reverse Trendelenburg - Respiratory therapy call for BiPAP, pulmonary and critical care medicine call for possible intubation - After nitroglycerin was given and reverse Trendelenburg position patient transient hypotension down to 76/40 mmHg that resolved after nitroglycerin was stopped. - Pacing rate increased to 80 beats per minute   at the time the patient was leaving Cath Lab: He saturation was 100%, he was breathing 22 times a minute, heart rate was 80, blood pressure was 126/83 murmurs mercury.  Exam at the time of arrival to the cardiac ICU: Objective: Filed Vitals:   08/07/13 2251  BP:   Pulse: 82  Temp:   Resp: 17   blood pressure 128/46 mmHg, map 76  General: Alert, following commands, in no significant distress  Cardiovascular: Regular rate and rhythm V. paced at 80 beats per minute: When placed in VVI 40 no meaningful escape rhythm noted, +1 bilateral lower extremity edema, difficult to assess JVD Respiratory: BiPAP in place: Good bilateral air entry, mild bilateral wheezings course breath sounds GI: Abdomen obese, soft nontender nondistended  Assessment: 1. Complete heart block likely secondary to demand ischemia given diffuse coronary artery disease and heart failure exacerbation 2. acute systolic  congestive heart failure exacerbation with pulmonary edema and respiratory failure   Plan: - Ordered home Plavix, Lipitor - Hold valsartan for now giving high risk of AK I. - Lasix 80 mg IV twice a day, first dose now (patient is on 60 mg of torsemide by mouth twice a day at home) - Potassium 60 mEq IV - spironolactone 25 mg daily - Hold Coreg given the need for pacing, as will need to be restarted on uptitrated - Patient will likely need Bi-ICD if he continues to require pacing   Critical care time spent 70 minutes CPT code [phone]

## 2013-08-07 NOTE — ED Notes (Signed)
Pt reporting nausea.  HR in the 120's.  Cards at bedside.  4mg  of Zofran given.

## 2013-08-07 NOTE — ED Notes (Signed)
Dopamine started at 43mcg/hr.  Verbal order given by Dr. Domenic Polite to start Dopamine.

## 2013-08-07 NOTE — ED Notes (Signed)
500 cc bolus started

## 2013-08-07 NOTE — H&P (Signed)
ELECTROPHYSIOLOGY ADMISSION HISTORY & PHYSICAL  Patient ID: Mark French MRN: [id number], DOB/AGE: 1946/02/03   Date of Admission: 08/07/2013  Primary Physician: [name], MD Primary Cardiologist: Burt Knack, MD Reason for Admission: Complete heart block, shortness of breath, chest pain  History of Present Illness Mark French is a 68 year old man with multiple chronic medical issues including chronic systolic HF with EF of 41%, CAD with remote CABG in 1998, inferior STEMI in 08/2011 with DES to the SVG to the DX - with patent LIMA to LAD, chronically occluded SVG to RCA, chronically occluded SVG to LCX and 95% stenosis in the SVG to the DX - associated with transient CHB requiring CPR and TTVP. He also has DM, HTN, HLD, CKD, ED, chronic stasis ulceration, legally blind, prior CVA, OA, COPD, PVD, iron deficiency anemia and hiatal hernia.  He is accompanied by wife who gives his history. He has not been feeling well for the last several days. He was recently admitted with syncope and no clear cut cause identified. He now presents with acute worsening SOB and chest pain. This began about 4 hours ago. His wife states they were not sure whether or not to call 911 because he always feels poorly. He has limited mobility and mostly gets around on his motorized scooter.   On arrival to the ED his heart rate was found to be 30 bpm and his SBP 98. He was given a total of 1 mg of atropine and 500 cc bolus of normal saline. He was weak but mentating. During our interview and exam, he developed recurrent chest pain and SOB. He developed complete heart block and was started on IV dopamine. Soon after the dopamine infusion was started, he then had AIVR vs slow VT and dopamine was stopped. He now remains in complete heart block.    Past Medical History Past Medical History  Diagnosis Date  . CORONARY ARTERY DISEASE     a. s/p CABG 1998. b.  08/2011: inferior STEMI.  LHC 09/07/11: Severe 3v CAD, LIMA-LAD patent,  SVG-RCA chronically occluded, SVG-circumflex chronically occluded, SVG-diagonal 95%. PCI: Promus DES to the SVG-diagonal.  Procedure c/b transient CHB req CPR and TTVP. c. Myoview 11/2011: scar but no ischemia.   [name] DIABETES MELLITUS, TYPE I   . HYPERTENSION   . 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 HYPERCHOLESTEROLEMIA   . CERVICAL RADICULOPATHY, LEFT   . PERIPHERAL NEUROPATHY   . Chronic systolic heart failure     Echocardiogram 09/07/11: Difficult study, inferior and posterior lateral wall hypokinesis, mild LVH, EF 45%, mild LAE.   [name] RENAL INSUFFICIENCY 03/15/2010  . ED (erectile dysfunction)   . Hypogonadism male   . GI bleed 2012    Multiple Duodenal ulcer   . Obesity   . Legally blind     bilaterally  . Stroke 26    age 32 months; residual right sided weakness  . History of blood transfusion 2012  . Charcot foot due to diabetes mellitus     chronic pain  . Osteoarthritis     right hip and shoulder  . COPD (chronic obstructive pulmonary disease)   . PERIPHERAL VASCULAR INSUFFICIENCY,  LEGS, BILATERAL 08/17/2010    ABI's performed 04/19/11 R 0.72 L 0.71  . ANEMIA-IRON DEFICIENCY 09/23/2008    ulcer related last year  . Chronic edema     Chronic pedal edema  . Hiatal hernia   . Chronic stasis dermatitis     Chronic stasis changes.  . CKD (  chronic kidney disease), stage III   . Syncope     a. 07/2013: suspected due to hypotension.    Past Surgical History Past Surgical History  Procedure Laterality Date  . Carpal tunnel release      left  . Sigmoidoscopy  03/11/2001  . Venous doppler  01/30/2004  . Tonsillectomy and adenoidectomy      "as a kid"  . Cataract extraction w/ intraocular lens  implant, bilateral    . Coronary angioplasty with stent placement  08/2011    "1"  . Retinopathy surgery  2000's    "laser; both eyes"  . Wrist fusion  1976    right  . Anterior fusion cervical spine  2010    "C spine; Dr. Ronnald Ramp"  . Ankle fracture surgery  1976; ?date     LEFT:  fused; removed bulk of hardware  . Heel spur surgery  ~ 2007    ? left  . Irrigation and debridement knee  02/27/2012    Procedure: IRRIGATION AND DEBRIDEMENT KNEE;  Surgeon: Augustin Schooling, MD;  Location: [facility];  Service: Orthopedics;  Laterality: Right;  irrigation and drainage right knee septic bursitis  . Eye surgery  2008    cataract removal, cautery  . Coronary artery bypass graft  1998    CABG X5  . Fracture surgery      foot broken 1976    Allergies/Intolerances Allergies  Allergen Reactions  . Sildenafil Nausea Only and Other (See Comments)    "took it once; heart went to pieces; never took it again"  . Percocet [Oxycodone-Acetaminophen] Other (See Comments)    Unknown reaction-possible altered mental state-per wife.    . Contrast Media [Iodinated Diagnostic Agents] Nausea Only    Home Medications   Medication List    ASK your doctor about these medications       PROAIR HFA 108 (90 BASE) MCG/ACT inhaler  Generic drug:  albuterol  INHALE 2 PUFFS INTO THE LUNGS EVERY 6 HOURS AS NEEDED FOR WHEEZING     albuterol 108 (90 BASE) MCG/ACT inhaler  Commonly known as:  PROVENTIL HFA;VENTOLIN HFA  Inhale 2 puffs into the lungs every 6 (six) hours as needed for wheezing or shortness of breath.     albuterol (2.5 MG/3ML) 0.083% nebulizer solution  Commonly known as:  PROVENTIL  Take 2.5 mg by nebulization 2 (two) times daily as needed for wheezing or shortness of breath.     aspirin EC 81 MG tablet  Take 81 mg by mouth at bedtime.     atorvastatin 40 MG tablet  Commonly known as:  LIPITOR  Take 40 mg by mouth at bedtime.     budesonide 0.25 MG/2ML nebulizer solution  Commonly known as:  PULMICORT  Take 0.25 mg by nebulization daily as needed (shortness of breath).     carvedilol 6.25 MG tablet  Commonly known as:  COREG  Take 6.25 mg by mouth 2 (two) times daily with a meal.     clopidogrel 75 MG tablet  Commonly known as:  PLAVIX  Take 75 mg by mouth daily  with breakfast.     co-enzyme Q-10 30 MG capsule  Take 30 mg by mouth daily at 2 PM daily at 2 PM.     docusate sodium 100 MG capsule  Commonly known as:  COLACE  Take 100 mg by mouth 2 (two) times daily as needed for constipation.     gabapentin 600 MG tablet  Commonly known as:  NEURONTIN  Take 1 tablet (600 mg total) by mouth 3 (three) times daily.     GENERLAC 10 GM/15ML Soln  Generic drug:  lactulose (encephalopathy)  Take 30 g by mouth daily as needed (constipation).     HYDROcodone-acetaminophen 10-325 MG per tablet  Commonly known as:  NORCO  Take 1-2 tablets by mouth every 4 (four) hours as needed for moderate pain.     insulin NPH Human 100 UNIT/ML injection  Commonly known as:  HUMULIN N,NOVOLIN N  Inject 70 Units into the skin daily before breakfast.     ipratropium 0.02 % nebulizer solution  Commonly known as:  ATROVENT  Take 500 mcg by nebulization every 6 (six) hours as needed for wheezing or shortness of breath.     isosorbide mononitrate 30 MG 24 hr tablet  Commonly known as:  IMDUR  TAKE 1 TABLET BY MOUTH EVERY DAY     losartan 50 MG tablet  Commonly known as:  COZAAR  TAKE 1 TABLET BY MOUTH EVERY DAY     losartan 50 MG tablet  Commonly known as:  COZAAR  Take 50 mg by mouth daily.     nitroGLYCERIN 0.4 MG SL tablet  Commonly known as:  NITROSTAT  Place 1 tablet (0.4 mg total) under the tongue every 5 (five) minutes as needed for chest pain.     OVER THE COUNTER MEDICATION  Apply 1 application topically daily as needed. Anti fungal cream     OVER THE COUNTER MEDICATION  Place 1 drop into both eyes daily as needed (dry eyes). [facility] eye drops     pantoprazole 40 MG tablet  Commonly known as:  PROTONIX  Take 1 tablet (40 mg total) by mouth daily.     potassium chloride SA 20 MEQ tablet  Commonly known as:  K-DUR,KLOR-CON  Take 20 mEq by mouth 2 (two) times daily.     RAPAFLO 8 MG Caps capsule  Generic drug:  silodosin  Take 8 mg by mouth  at bedtime.     spironolactone 25 MG tablet  Commonly known as:  ALDACTONE  Take 12.5 mg by mouth daily.     torsemide 20 MG tablet  Commonly known as:  DEMADEX  Take 60 mg by mouth 2 (two) times daily. Morning and mid afternoon        Family History Family History  Problem Relation Age of Onset  . Heart disease Mother     Coronary Artery Disease  . Heart disease Father     Coronary Artery Disease  . Heart disease Paternal Grandmother     Coronary Artery Disease  . Colon cancer Neg Hx   . Diabetes Other     Grandmother     Social History History   Social History  . Marital Status: Married    Spouse Name: N/A    Number of Children: 0  . Years of Education: N/A   Occupational History  . Disabled    Social History Main Topics  . Smoking status: Never Smoker   . Smokeless tobacco: Never Used  . Alcohol Use: No  . Drug Use: No  . Sexual Activity: No   Other Topics Concern  . Not on file   Social History Narrative   Comes to appointments with wife      0 Caffeine drinks daily      Review of Systems General: No chills, fever, night sweats or weight changes.  Cardiovascular: No chest pain, dyspnea on exertion, edema, orthopnea, palpitations, paroxysmal  nocturnal dyspnea. Dermatological: No rash, lesions or masses. Respiratory: No cough, dyspnea. Urologic: No hematuria, dysuria. Abdominal: No nausea, vomiting, diarrhea, bright red blood per rectum, melena, or hematemesis. Neurologic: No visual changes, weakness, changes in mental status. All other systems reviewed and are otherwise negative except as noted above.  Physical Exam Vitals: Blood pressure 121/37, pulse 81, temperature 98.1 F (36.7 C), temperature source Oral, resp. rate 16, SpO2 100.00%.  General: Well developed, ill appearing 68 y.o. male in no acute distress. HEENT: Normocephalic, atraumatic. Sclera nonicteric. Oropharynx clear.  Neck: Supple. No JVD. Lungs: Respirations regular and  unlabored, CTA bilaterally. No wheezes, rales or rhonchi. Heart: Bradycardic. S1, S2 present. No murmurs, rub, S3 or S4. Abdomen: Soft, non-distended.  Extremities: No clubbing, cyanosis or edema. DP/Radials equal bilaterally. Psych: Normal affect. Neuro: Alert and oriented X 3. Moves all extremities spontaneously. Musculoskeletal: No kyphosis. Skin: Intact. Warm and dry. No rashes or petechiae in exposed areas.   Labs Troponin <0.03 Lab Results  Component Value Date   WBC 8.0 08/07/2013   HGB 11.5* 08/07/2013   HCT 36.1* 08/07/2013   MCV 88.7 08/07/2013   PLT 192 08/07/2013      Component Value Date/Time   NA 139 08/07/2013 1745   K 4.5 08/07/2013 1745   CL 98 08/07/2013 1745   CO2 30 08/07/2013 1745   GLUCOSE 147* 08/07/2013 1745   BUN 22 08/07/2013 1745   CREATININE 1.59* 08/07/2013 1745   CALCIUM 8.7 08/07/2013 1745   CALCIUM 8.7 12/15/2010 1645   GFRNONAA 43* 08/07/2013 1745   GFRAA 49* 08/07/2013 1745    Radiology/Studies Dg Chest Port 1 View  08/07/2013   CLINICAL DATA:  Headache cardia. History of coronary artery disease.  EXAM: PORTABLE CHEST - 1 VIEW  COMPARISON:  Chest x-ray 06/15/2013.  FINDINGS: Lung volumes are normal. Film is slightly under penetrated. With this limitation in mind, there is no definite acute consolidative airspace disease, and no definite pleural effusions. Pulmonary vasculature is normal. Heart size appears mildly enlarged, although this may be accentuated by portable technique, and patient rotation to the left, which also distorts upper mediastinal contours. Atherosclerosis in the thoracic aorta. Status post median sternotomy for CABG, including LIMA. External defibrillator pads are seen projecting over the mid to lower thorax bilaterally. Orthopedic fixation hardware in the lower cervical spine incidentally noted.  IMPRESSION: 1. No definite radiographic evidence of acute cardiopulmonary disease. 2. Heart size appears mildly enlarged. 3. Atherosclerosis. 4. Postoperative  changes and support apparatus, as above.   Electronically Signed   By: Vinnie Langton M.D.   On: 08/07/2013 17:58   Echocardiogram  Study Conclusions Left ventricle: The cavity size was mildly dilated. Wall thickness was normal. Systolic function was moderately reduced. The estimated ejection fraction was in the range of 35% to 40%. Severe hypokinesis of the inferior myocardium.  12-lead ECG in the ED shows high grade AV block; RBBB Telemetry in the ED shows intermittent complete heart block   Assessment and Plan 1. High grade AV block 2. Ischemic CM, EF 35-40% 3. Chronic systolic HF 4. CAD s/p CABG 1998, s/p inferior STEMI Jan 2013 s/p DES to SVG-diagonal  Mark French is hemodynamically stable. We have recommended a cardiac catheterization to rule out coronary ischemia in addition to having a transvenous pacing wire placed. We have started IV heparin for now. He will be taken emergently to the cath lab.   Signed, Ileene Hutchinson, PA-C 08/07/2013, 6:18 PM   Attending note:  Patient seen and  examined urgently in ER. Reviewed records and discussed the case with Ms. Edmisten PA-C.  Also called in Dr. Ellyn Hack for interventional backup. Mark French is a medically complex 68 year old male with history of multivessel disease status post previous CABG, known graft disease with inferior STEMI in January ofl 2013 requiring DES to the SVG to diagonal system. Procedure at that time was complicated by transient heart block requiring TTVP. Additional chronic medical problems are noted above. He has been in his usual state of health until earlier today, suddenly developed shortness of breath and profound weakness. His wife brought him to the emergency room where he was noted to have symptomatic bradycardia with second-degree and higher heart block, heart rate in the 30s. He did report some chest tightness as well. He was treated with atropine, had a trial of low-dose dopamine, however heart block  persisted with intermittent episodes of AIVR and question of lateral injury current. Initial troponin I negative. 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 Interestingly, he was recently admitted after an episode of syncope that was felt potentially related to blood pressure, although one wonders if he had an episode of symptomatic heart block that resolved. Recent echocardiogram demonstrates an LVEF in the range of 35-40% with inferior hypokinesis. Situation was reviewed with the patient and his wife. Case discussed with Dr. Ellyn Hack. Plan is urgent cardiac catheterization and placement of TTVP for further stabilization. There is a question of intravenous contrast allergy for which she will be pretreated. Question of whether he needs a permanent pacemaker plus/minus candidate for ICD can be contemplated by EP.  Satira Sark, M.D., F.A.C.C.

## 2013-08-07 NOTE — ED Notes (Signed)
0.1mg of Atropine given.  

## 2013-08-07 NOTE — ED Provider Notes (Signed)
CSN: [id number]     Arrival date & time 08/07/13  1706 History   First MD Initiated Contact with Patient 08/07/13 1721     Chief Complaint  Patient presents with  . Fall  . Dizziness   (Consider location/radiation/quality/duration/timing/severity/associated sxs/prior Treatment) HPI  A LEVEL 5 CAVEAT PERTAINS DUE TO URGENT NEED FOR INTERVENTION.  Pt brought back from triage due to bradycardia.  Per wife he has had mulitple falls recently and has been very weak.  Today he felt that he was dizzy and lightheaded.  He did not syncopize.  He then became nauseated and had one episode of emesis. Pt has significant medical history and multiple cardiac issues.  On arrival HR 30, hypotensive but awake and alert.   Past Medical History  Diagnosis Date  . CORONARY ARTERY DISEASE     a. s/p CABG 1998. b.  08/2011: inferior STEMI.  LHC 09/07/11: Severe 3v CAD, LIMA-LAD patent, SVG-RCA chronically occluded, SVG-circumflex chronically occluded, SVG-diagonal 95%. PCI: Promus DES to the SVG-diagonal.  Procedure c/b transient CHB req CPR and TTVP. c. Myoview 11/2011: scar but no ischemia.   [name] DIABETES MELLITUS, TYPE I   . HYPERTENSION   . 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 HYPERCHOLESTEROLEMIA   . CERVICAL RADICULOPATHY, LEFT   . PERIPHERAL NEUROPATHY   . Chronic systolic heart failure     Echocardiogram 09/07/11: Difficult study, inferior and posterior lateral wall hypokinesis, mild LVH, EF 45%, mild LAE.   [name] RENAL INSUFFICIENCY 03/15/2010  . ED (erectile dysfunction)   . Hypogonadism male   . GI bleed 2012    Multiple Duodenal ulcer   . Obesity   . Legally blind     bilaterally  . Stroke 57    age 69 months; residual right sided weakness  . History of blood transfusion 2012  . Charcot foot due to diabetes mellitus     chronic pain  . Osteoarthritis     right hip and shoulder  . COPD (chronic obstructive pulmonary disease)   . PERIPHERAL VASCULAR INSUFFICIENCY,  LEGS, BILATERAL 08/17/2010     ABI's performed 04/19/11 R 0.72 L 0.71  . ANEMIA-IRON DEFICIENCY 09/23/2008    ulcer related last year  . Chronic edema     Chronic pedal edema  . Hiatal hernia   . Chronic stasis dermatitis     Chronic stasis changes.  . CKD (chronic kidney disease), stage III   . Syncope     a. 07/2013: suspected due to hypotension.   Past Surgical History  Procedure Laterality Date  . Carpal tunnel release      left  . Sigmoidoscopy  03/11/2001  . Venous doppler  01/30/2004  . Tonsillectomy and adenoidectomy      "as a kid"  . Cataract extraction w/ intraocular lens  implant, bilateral    . Coronary angioplasty with stent placement  08/2011    "1"  . Retinopathy surgery  2000's    "laser; both eyes"  . Wrist fusion  1976    right  . Anterior fusion cervical spine  2010    "C spine; Dr. Ronnald Ramp"  . Ankle fracture surgery  1976; ?date    LEFT:  fused; removed bulk of hardware  . Heel spur surgery  ~ 2007    ? left  . Irrigation and debridement knee  02/27/2012    Procedure: IRRIGATION AND DEBRIDEMENT KNEE;  Surgeon: Augustin Schooling, MD;  Location: [place];  Service: Orthopedics;  Laterality: Right;  irrigation and  drainage right knee septic bursitis  . Eye surgery  2008    cataract removal, cautery  . Coronary artery bypass graft  1998    CABG X5  . Fracture surgery      foot broken 1976   Family History  Problem Relation Age of Onset  . Heart disease Mother     Coronary Artery Disease  . Heart disease Father     Coronary Artery Disease  . Heart disease Paternal Grandmother     Coronary Artery Disease  . Colon cancer Neg Hx   . Diabetes Other     Grandmother   History  Substance Use Topics  . Smoking status: Never Smoker   . Smokeless tobacco: Never Used  . Alcohol Use: No    Review of Systems UNABLE TO OBTAIN ROS DUE TO LEVEL 5 CAVEAT  Allergies  Sildenafil; Percocet; and Contrast media  Home Medications   No current outpatient prescriptions on file. BP 120/35   Pulse 92  Temp(Src) 98.2 F (36.8 C) (Oral)  Resp 24  Ht 5\' 5"  (1.651 m)  Wt 229 lb 15 oz (104.3 kg)  BMI 38.26 kg/m2  SpO2 90% Vitals reviewed Physical Exam Physical Examination: General appearance - alert, ill appearing, and in mild distress Mental status - alert, oriented to person, place, and time, answering all questions Eyes - pupils equal and reactive, extraocular eye movements intact, no conjunctival injection Mouth - mucous membranes moist, pharynx normal without lesions Chest - clear to auscultation, no wheezes, rales or rhonchi, symmetric air entry, normal respiratory effort Heart - normal rate, regular rhythm, normal S1, S2, no murmurs, rubs, clicks or gallops Abdomen - soft, nontender, nondistended, no masses or organomegaly Extremities - peripheral pulses normal, no pedal edema, no clubbing or cyanosis Skin - pallor with normal turgor, no rashes  ED Course  Procedures (including critical care time)  CRITICAL CARE Performed by: Threasa Beards Total critical care time: 75 Critical care time was exclusive of separately billable procedures and treating other patients. Critical care was necessary to treat or prevent imminent or life-threatening deterioration. Critical care was time spent personally by me on the following activities: development of treatment plan with patient and/or surrogate as well as nursing, discussions with consultants, evaluation of patient's response to treatment, examination of patient, obtaining history from patient or surrogate, ordering and performing treatments and interventions, ordering and review of laboratory studies, ordering and review of radiographic studies, pulse oximetry and re-evaluation of patient's condition.   6:29 PM cardiology is here at bedside.  Pt now in complete heart block.  Dr. Domenic Polite has talked with Dr. Ellyn Hack and pt is going to cath lab.   Labs Review Labs Reviewed  CBC - Abnormal; Notable for the following:    RBC  4.07 (*)    Hemoglobin 11.5 (*)    HCT 36.1 (*)    All other components within normal limits  COMPREHENSIVE METABOLIC PANEL - Abnormal; Notable for the following:    Glucose, Bld 147 (*)    Creatinine, Ser 1.59 (*)    Albumin 3.3 (*)    GFR calc non Af Amer 43 (*)    GFR calc Af Amer 49 (*)    All other components within normal limits  MAGNESIUM - Abnormal; Notable for the following:    Magnesium 2.7 (*)    All other components within normal limits  CBC WITH DIFFERENTIAL - Abnormal; Notable for the following:    Hemoglobin 12.7 (*)    Neutrophils Relative %  92 (*)    Lymphocytes Relative 6 (*)    Lymphs Abs 0.5 (*)    Monocytes Relative 1 (*)    All other components within normal limits  BASIC METABOLIC PANEL - Abnormal; Notable for the following:    Glucose, Bld 221 (*)    Creatinine, Ser 1.40 (*)    GFR calc non Af Amer 50 (*)    GFR calc Af Amer 58 (*)    All other components within normal limits  PRO B NATRIURETIC PEPTIDE - Abnormal; Notable for the following:    Pro B Natriuretic peptide (BNP) 568.0 (*)    All other components within normal limits  GLUCOSE, CAPILLARY - Abnormal; Notable for the following:    Glucose-Capillary 201 (*)    All other components within normal limits  ALBUMIN - Abnormal; Notable for the following:    Albumin 3.2 (*)    All other components within normal limits  GLUCOSE, CAPILLARY - Abnormal; Notable for the following:    Glucose-Capillary 182 (*)    All other components within normal limits  GLUCOSE, CAPILLARY - Abnormal; Notable for the following:    Glucose-Capillary 173 (*)    All other components within normal limits  CBC WITH DIFFERENTIAL - Abnormal; Notable for the following:    WBC 15.3 (*)    Hemoglobin 11.9 (*)    HCT 37.6 (*)    Neutrophils Relative % 87 (*)    Neutro Abs 13.3 (*)    Lymphocytes Relative 8 (*)    All other components within normal limits  BASIC METABOLIC PANEL - Abnormal; Notable for the following:     Glucose, Bld 178 (*)    BUN 26 (*)    GFR calc non Af Amer 56 (*)    GFR calc Af Amer 65 (*)    All other components within normal limits  GLUCOSE, CAPILLARY - Abnormal; Notable for the following:    Glucose-Capillary 213 (*)    All other components within normal limits  BASIC METABOLIC PANEL - Abnormal; Notable for the following:    Glucose, Bld 159 (*)    BUN 26 (*)    GFR calc non Af Amer 53 (*)    GFR calc Af Amer 62 (*)    All other components within normal limits  GLUCOSE, CAPILLARY - Abnormal; Notable for the following:    Glucose-Capillary 148 (*)    All other components within normal limits  POCT I-STAT 3, BLOOD GAS (G3+) - Abnormal; Notable for the following:    pO2, Arterial 195.0 (*)    Bicarbonate 30.0 (*)    Acid-Base Excess 5.0 (*)    All other components within normal limits  MRSA PCR SCREENING  TROPONIN I  TROPONIN I  TROPONIN I  MAGNESIUM  PROTIME-INR   Imaging Review Dg Chest 2 View  08/09/2013   CLINICAL DATA:  Status post device implant  EXAM: CHEST  2 VIEW  COMPARISON:  DG CHEST 1V PORT dated 08/08/2013  FINDINGS: Left-sided pacemaker placed with 3 continuous leads overlies normal cardiac silhouette. No evidence of pneumothorax following placement. Stable cardiac silhouette. Mild central venous pulmonary congestion. Increased cervical fusion noted  IMPRESSION: 1. Interval placement of left-sided pacemaker with no complicating features. 2. Mild central venous pulmonary congestion.   Electronically Signed   By: Suzy Bouchard M.D.   On: 08/09/2013 07:23   Dg Chest Port 1 View  08/08/2013   CLINICAL DATA:  CHF.  EXAM: PORTABLE CHEST - 1 VIEW  COMPARISON:  08/07/2013.  FINDINGS: Prior CABG. Cardiomegaly. Normal pulmonary vascularity. Pacing leads are noted over the chest. Atelectatic changes left lung base. No pleural effusion or pneumothorax.  IMPRESSION: 1. Prior CABG.  Cardiomegaly.  No pulmonary venous congestion . 2. Mild atelectatic changes left lung base.    Electronically Signed   By: Marcello Moores  Register   On: 08/08/2013 07:05   Dg Chest Port 1 View  08/07/2013   CLINICAL DATA:  Headache cardia. History of coronary artery disease.  EXAM: PORTABLE CHEST - 1 VIEW  COMPARISON:  Chest x-ray 06/15/2013.  FINDINGS: Lung volumes are normal. Film is slightly under penetrated. With this limitation in mind, there is no definite acute consolidative airspace disease, and no definite pleural effusions. Pulmonary vasculature is normal. Heart size appears mildly enlarged, although this may be accentuated by portable technique, and patient rotation to the left, which also distorts upper mediastinal contours. Atherosclerosis in the thoracic aorta. Status post median sternotomy for CABG, including LIMA. External defibrillator pads are seen projecting over the mid to lower thorax bilaterally. Orthopedic fixation hardware in the lower cervical spine incidentally noted.  IMPRESSION: 1. No definite radiographic evidence of acute cardiopulmonary disease. 2. Heart size appears mildly enlarged. 3. Atherosclerosis. 4. Postoperative changes and support apparatus, as above.   Electronically Signed   By: Vinnie Langton M.D.   On: 08/07/2013 17:58    EKG Interpretation    Date/Time:  Thursday August 07 2013 17:50:53 EST Ventricular Rate:  73 PR Interval:  219 QRS Duration: 145 QT Interval:  317 QTC Calculation: 349 R Axis:   -132 Text Interpretation:  Second degree AV block, Mobitz II versus complete heart block Atrial premature complexes in couplets Nonspecific intraventricular conduction delay Borderline repol abnormality, diffuse leads significantly changed since prior tracing in december 2014 Confirmed by Canary Brim  MD, [place] [phone]) on 08/07/2013 7:13:22 PM            MDM   1. Heart block   2. Bradycardia   3. Hypotension   4. Syncope   5. Renal insufficiency   6. Acute coronary syndrome   7. Chronic systolic heart failure   8. Coronary atherosclerosis of autologous  vein bypass graft   9. DM2 (diabetes mellitus, type 2)   10. Mobitz type 2 second degree heart block   11. Chest pain   12. Old myocardial infarction   40. Unstable angina   14. Acute on chronic systolic heart failure   15. Acute respiratory failure   16. Acute pulmonary edema   17. Chronic kidney disease, stage III (moderate)   18. Complete heart block    Pt seen emergently upon being brought back from triage.  He is pale, weak appearing, HR 30, hypotensive.  PT placed on stretcher, IV access obtained, placed on monitor, EKG shows second degree heart block, mobitz type 2.  Pt given atropine x 2 with only fleeting increase of HR to upper 40s.  BP improved slightly, then decreased again.  Small NS bolus given.  Cardiology consult called emergently.  Pt remained awake and alert.  Zoll pads placed on patient if transcutaneous pacing became necessary.  Pt c/o chest pain briefly.  Repeat EKG at that time showed 2nd degree versus complete heart block- baseline flat and difficult to detect p waves.  Cardiology at bedside.  They have d/w interventional and ultimately decided to take patient to cath lab.  He has significant coronary hx , prior records reviewed.  CXR images reviewed and interpreted by  me as well.  Pt and wife updated mulitple times.      Threasa Beards, MD 08/09/13 1128

## 2013-08-07 NOTE — CV Procedure (Addendum)
CARDIAC CATHETERIZATION REPORT  NAME:  Mark French   MRN: [id number] DOB:  09-24-1945   ADMIT DATE: 08/07/2013 Procedure Date: 08/07/2013  INTERVENTIONAL CARDIOLOGIST: Leonie Man, M.D., MS PRIMARY CARE PROVIDER: [name], MD PRIMARY CARDIOLOGIST:  PATIENT:  Mark French is a 68 y.o. male with h/o CAD - (CABG in 1998) with known patent LIMA-LAD but 2 of 3 SVG occluded & STEMI in Jan 2013 leading to PCI of SVG-Diag & complicated by CHB. He has ischemic CM by Echo& is otehrwise chronically disabled: DM, HTN, HLD, CKD, ED, chronic stasis ulceration, legally blind, prior CVA, OA, COPD, PVD, iron deficiency anemia and hiatal hernia.  He is accompanied by wife who gives his history. He has not been feeling well for the last several days. He was recently admitted with syncope and no clear cut cause identified. He now presents with acute worsening SOB and chest pain. This began about 4 hours ago. His wife states they were not sure whether or not to call 911 because he always feels poorly. He has limited mobility and mostly gets around on his motorized scooter.  On arrival to the ED his heart rate was found to be 30 bpm and his SBP 98. He was given a total of 1 mg of atropine and 500 cc bolus of normal saline. He was weak but mentating. During our interview and exam, he developed recurrent chest pain and SOB. He developed complete heart block and was started on IV dopamine. Soon after the dopamine infusion was started, he then had AIVR vs slow VT and dopamine was stopped. He now remains in complete heart block.  He was seen & evaluated by Ileene Hutchinson, PA & Dr. Myles Gip, who felt that he couldpotentially be having ischemic related CHB. The have referred him for LHC/Nativ & Graft Angiography along with TTVP placement.  PRE-OPERATIVE DIAGNOSIS:    Complete Heart Block  Chest Pain with moderate risk for ACS  Known Severe Native & Graft CAD  Ischemic Cardiomyopathy  PROCEDURES  PERFORMED:    LEFT HEART CATHETERIZATION WITH NATIVE AND GRAFT ANGIOGRAPHY  TEMPORARY TRANSVENOUS PACEMAKER PLACEMENT  PROCEDURE:Consent:  Risks of procedure as well as the alternatives and risks of each were explained to the (patient/caregiver).  Consent for procedure obtained. Consent for signed by MD and patient with RN witness -- placed on chart.   PROCEDURE: The patient was brought to the [facility] in the fasting state and prepped and draped in the usual sterile fashion for Right groin access. Sterile technique was used including antiseptics, cap, gloves, gown, hand hygiene, mask and sheet.  Skin prep: Chlorhexidine.  Time Out: Verified patient identification, verified procedure, site/side was marked, verified correct patient position, special equipment/implants available, medications/allergies/relevent history reviewed, required imaging and test results available.  Performed  Access:   RIGHT FEMORAL VEIN:  6Fr, fluoroscopically guided Seldinger Technique  RIGHT COMMON FEMORAL Artery; 6 Fr Sheath -- fluoroscopically guided, modified Seldinger technique   Difficult access due to significant pannus & restless leg.  2 x successful arterial puncture with inability to pass wire due to patient movement.  Versicore wire was used.   Temporary Transvenous Pacemaker Placement:  A 5 Fr Transvenous Pacer Wire was advanced through the Venous Sheath under fluoroscopic guidance, and with with balloon inflated advanced into the Right Ventricular Apex.  The wires were connected to the pacing computer. Thresholds were measured & settings were set at 5 mAMP, rate of 70 bpm.  TTVP distance ~65-67 mm.  Diagnostic LEFT HEART CATHETERIZATION:  5 Fr JL4, JR4, & Angled Pigtail cathteters advanced & exchanged over J-wire.  Left Coronary Artery Angiography: JL4  Right Coronary Artery , SVG-OM & SVG-Diag Angiography: JR4 --> then redirected into Left Subclavian Artery &  used for LIMA-LAD Angiography.  LV Hemodynamics (LV Gram): JR4  Sheaths:  RFA Sheath removed with direct manual pressure held for hemostasis.  Venous sheath sutured in place.  MEDICATIONS:  Anesthesia:  Local Lidocaine 26 ml  Sedation:  25 mcg IV fentanyl ;   Premedication: 125 mcg Solumedrol; 25 mg Benadryl  Omnipaque Contrast: 110 ml  Hemodynamics:  Central Aortic / Mean Pressures: 150/63 mmHg; 93 mmHg  Left Ventricular Pressures / EDP: 149/28 mmHg; 32 mmHg  Left Ventriculography: Not performed  Coronary Anatomy:  Left Main: Moderate caliber vessel that bifurcates into the LAD & Circumflex; angiographically normal LAD: Patent proximally with large proximal septal trunk (with faint L-R collaterals to RPDA) followed by extensive calcification and 100% mid occlusion.  Small proximal Diag.    D2: Perfused by patent SVG  Left Circumflex: 100% proximal occlusion after  Very small marginal branch.   RCA: Small to moderate caliber, dominant vessel with severe diffuse proximal and distal disease, yet TIMI 1 flow to the small caliber PDA.    Graft Anatomy:  LIMA-LAD: widely patent to a diminutive target LAD that wraps the apex; retrograde flow to a large septal perforator  SVG-Diag: widely patent with ~30-40% "higne-point" in mid vessel; stent is widely patent.  SVG-OM & SVG-RCA: known occlusion.  PATIENT DISPOSITION:    The patient was transferred to the PACU holding area in a hemodynamicaly stable, chest pain free condition.  The patient tolerated the procedure well, and there were no complications.  EBL:   < 15 ml  The patient was stable before, during, and after the procedure.  POST-OPERATIVE DIAGNOSIS:    Complete Heart Block, s/ Successful Temporary Transvenous Pacemaker placemnt  Stable severe Native & Graft Anatomy  Severely elevated LVEDP with known Ischemic CM  PLAN OF CARE:  Admit to CCU on TPM - rate of 70 bpm  EP consult for PPM vs. BiVPM (+/-  AICD)   Gentle post-cath hydration.   Leonie Man, M.D., M.S. [facility] [address] [place], [place]  [address]  [phone]  08/07/2013 8:37 PM

## 2013-08-07 NOTE — ED Notes (Signed)
Dopamine stopped

## 2013-08-07 NOTE — ED Notes (Signed)
Pt reporting chest tightness. 

## 2013-08-07 NOTE — Consult Note (Signed)
Name: Mark French MRN: [id number] DOB: [birth year]    ADMISSION DATE:  08/07/2013 CONSULTATION DATE:  08/07/2013  REFERRING MD :  Glenetta Hew  CHIEF COMPLAINT:  Short of breath  BRIEF PATIENT DESCRIPTION:  68 yo male presented with syncope, dyspnea, and chest pain.  He has hx of CAD, systolic CHF.  He was found to have complete heart block, and taken to cath lab for transvenous pacer.  He developed acute pulmonary edema, and PCCM consulted to assist with respiratory management.  SIGNIFICANT EVENTS: 1/01 Admit, transvenous pacer, respiratory distress >> improved with NTG, lasix, BiPAP, pacing  STUDIES:  1/01 Lt heart cath  LINES / TUBES: Transvenous pacer 1/01 >>  CULTURES: None  ANTIBIOTICS: None  HISTORY OF PRESENT ILLNESS:   Hx obtained from d/w pt's wife, d/w medical staff, and review of medical record.  68 yo male presented with syncope, dyspnea, and chest pain.  He was started on dopamine in ED, and given atropine and IV fluid.  He was found to have high degree AV block.  He developed AIVR vs slow VT while on dopamine, and this was stopped.  He then was in complete heart block.  He was taken to cath lab for Lt heart cath and transvenous pacer insertion.  He was found to have LVEDP of 32 mmHg and severe CAD, but no change in coronary anatomy compared to previous studies.  He developed severe respiratory distress shortly after procedure.  He was given lasix, NTG infusion, and started on BiPAP.  He became hypotensive after starting NTG, and this was stopped with improvement in blood pressure.  His respiratory status improved after starting on BiPAP.  PAST MEDICAL HISTORY :  Past Medical History  Diagnosis Date  . CORONARY ARTERY DISEASE     a. s/p CABG 1998. b.  08/2011: inferior STEMI.  LHC 09/07/11: Severe 3v CAD, LIMA-LAD patent, SVG-RCA chronically occluded, SVG-circumflex chronically occluded, SVG-diagonal 95%. PCI: Promus DES to the SVG-diagonal.  Procedure c/b  transient CHB req CPR and TTVP. c. Myoview 11/2011: scar but no ischemia.   [name] DIABETES MELLITUS, TYPE I   . HYPERTENSION   . 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 HYPERCHOLESTEROLEMIA   . CERVICAL RADICULOPATHY, LEFT   . PERIPHERAL NEUROPATHY   . Chronic systolic heart failure     Echocardiogram 09/07/11: Difficult study, inferior and posterior lateral wall hypokinesis, mild LVH, EF 45%, mild LAE.   [name] RENAL INSUFFICIENCY 03/15/2010  . ED (erectile dysfunction)   . Hypogonadism male   . GI bleed 2012    Multiple Duodenal ulcer   . Obesity   . Legally blind     bilaterally  . Stroke 89    age 12 months; residual right sided weakness  . History of blood transfusion 2012  . Charcot foot due to diabetes mellitus     chronic pain  . Osteoarthritis     right hip and shoulder  . COPD (chronic obstructive pulmonary disease)   . PERIPHERAL VASCULAR INSUFFICIENCY,  LEGS, BILATERAL 08/17/2010    ABI's performed 04/19/11 R 0.72 L 0.71  . ANEMIA-IRON DEFICIENCY 09/23/2008    ulcer related last year  . Chronic edema     Chronic pedal edema  . Hiatal hernia   . Chronic stasis dermatitis     Chronic stasis changes.  . CKD (chronic kidney disease), stage III   . Syncope     a. 07/2013: suspected due to hypotension.   Past Surgical History  Procedure Laterality  Date  . Carpal tunnel release      left  . Sigmoidoscopy  03/11/2001  . Venous doppler  01/30/2004  . Tonsillectomy and adenoidectomy      "as a kid"  . Cataract extraction w/ intraocular lens  implant, bilateral    . Coronary angioplasty with stent placement  08/2011    "1"  . Retinopathy surgery  2000's    "laser; both eyes"  . Wrist fusion  1976    right  . Anterior fusion cervical spine  2010    "C spine; Dr. Ronnald Ramp"  . Ankle fracture surgery  1976; ?date    LEFT:  fused; removed bulk of hardware  . Heel spur surgery  ~ 2007    ? left  . Irrigation and debridement knee  02/27/2012    Procedure: IRRIGATION AND  DEBRIDEMENT KNEE;  Surgeon: Augustin Schooling, MD;  Location: [facility];  Service: Orthopedics;  Laterality: Right;  irrigation and drainage right knee septic bursitis  . Eye surgery  2008    cataract removal, cautery  . Coronary artery bypass graft  1998    CABG X5  . Fracture surgery      foot broken 1976   Prior to Admission medications   Medication Sig Start Date End Date Taking? Authorizing Provider  albuterol (PROVENTIL HFA;VENTOLIN HFA) 108 (90 BASE) MCG/ACT inhaler Inhale 2 puffs into the lungs every 6 (six) hours as needed for wheezing or shortness of breath.     Historical Provider, MD  albuterol (PROVENTIL) (2.5 MG/3ML) 0.083% nebulizer solution Take 2.5 mg by nebulization 2 (two) times daily as needed for wheezing or shortness of breath.     Historical Provider, MD  aspirin EC 81 MG tablet Take 81 mg by mouth at bedtime.    Historical Provider, MD  atorvastatin (LIPITOR) 40 MG tablet Take 40 mg by mouth at bedtime.     Historical Provider, MD  budesonide (PULMICORT) 0.25 MG/2ML nebulizer solution Take 0.25 mg by nebulization daily as needed (shortness of breath).     Historical Provider, MD  carvedilol (COREG) 6.25 MG tablet Take 6.25 mg by mouth 2 (two) times daily with a meal.    Historical Provider, MD  clopidogrel (PLAVIX) 75 MG tablet Take 75 mg by mouth daily with breakfast.    Historical Provider, MD  co-enzyme Q-10 30 MG capsule Take 30 mg by mouth daily at 2 PM daily at 2 PM.     Historical Provider, MD  docusate sodium (COLACE) 100 MG capsule Take 100 mg by mouth 2 (two) times daily as needed for constipation.    Historical Provider, MD  gabapentin (NEURONTIN) 600 MG tablet Take 1 tablet (600 mg total) by mouth 3 (three) times daily. 05/13/13   [name], MD  HYDROcodone-acetaminophen (NORCO) 10-325 MG per tablet Take 1-2 tablets by mouth every 4 (four) hours as needed for moderate pain. 08/06/13   Elayne Snare, MD  insulin NPH (HUMULIN N,NOVOLIN N) 100 UNIT/ML injection Inject  70 Units into the skin daily before breakfast. 07/14/13   [name], MD  ipratropium (ATROVENT) 0.02 % nebulizer solution Take 500 mcg by nebulization every 6 (six) hours as needed for wheezing or shortness of breath.     Historical Provider, MD  isosorbide mononitrate (IMDUR) 30 MG 24 hr tablet TAKE 1 TABLET BY MOUTH EVERY DAY 07/17/13   Sherren Mocha, MD  lactulose, encephalopathy, (GENERLAC) 10 GM/15ML SOLN Take 30 g by mouth daily as needed (constipation).  Historical Provider, MD  losartan (COZAAR) 50 MG tablet Take 50 mg by mouth daily.    Historical Provider, MD  losartan (COZAAR) 50 MG tablet TAKE 1 TABLET BY MOUTH EVERY DAY 07/29/13   Sherren Mocha, MD  nitroGLYCERIN (NITROSTAT) 0.4 MG SL tablet Place 1 tablet (0.4 mg total) under the tongue every 5 (five) minutes as needed for chest pain. 10/29/12   Sherren Mocha, MD  OVER THE COUNTER MEDICATION Apply 1 application topically daily as needed. Anti fungal cream    Historical Provider, MD  OVER THE COUNTER MEDICATION Place 1 drop into both eyes daily as needed (dry eyes). [facility] eye drops    Historical Provider, MD  pantoprazole (PROTONIX) 40 MG tablet Take 1 tablet (40 mg total) by mouth daily. 05/13/13   [name], MD  potassium chloride SA (K-DUR,KLOR-CON) 20 MEQ tablet Take 20 mEq by mouth 2 (two) times daily.    Historical Provider, MD  PROAIR HFA 108 (90 BASE) MCG/ACT inhaler INHALE 2 PUFFS INTO THE LUNGS EVERY 6 HOURS AS NEEDED FOR WHEEZING 07/22/13   [name], MD  silodosin (RAPAFLO) 8 MG CAPS capsule Take 8 mg by mouth at bedtime.     Historical Provider, MD  spironolactone (ALDACTONE) 25 MG tablet Take 12.5 mg by mouth daily.    Historical Provider, MD  torsemide (DEMADEX) 20 MG tablet Take 60 mg by mouth 2 (two) times daily. Morning and mid afternoon    Historical Provider, MD   Allergies  Allergen Reactions  . Sildenafil Nausea Only and Other (See Comments)    "took it once; heart went to pieces; never took it  again"  . Percocet [Oxycodone-Acetaminophen] Other (See Comments)    Unknown reaction-possible altered mental state-per wife.    . Contrast Media [Iodinated Diagnostic Agents] Nausea Only    FAMILY HISTORY:  Family History  Problem Relation Age of Onset  . Heart disease Mother     Coronary Artery Disease  . Heart disease Father     Coronary Artery Disease  . Heart disease Paternal Grandmother     Coronary Artery Disease  . Colon cancer Neg Hx   . Diabetes Other     Grandmother   SOCIAL HISTORY:  reports that he has never smoked. He has never used smokeless tobacco. He reports that he does not drink alcohol or use illicit drugs.  REVIEW OF SYSTEMS:   Unable to obtain  SUBJECTIVE:   VITAL SIGNS: Temp:  [98.1 F (36.7 C)] 98.1 F (36.7 C) (01/01 1716) Pulse Rate:  [29-124] 92 (01/01 1853) Resp:  [9-24] 21 (01/01 1902) BP: (95-126)/(26-71) 99/32 mmHg (01/01 1902) SpO2:  [88 %-100 %] 100 % (01/01 1902) HEMODYNAMICS:   VENTILATOR SETTINGS:   INTAKE / OUTPUT: Intake/Output   None     PHYSICAL EXAMINATION: General:  Ill appearing Neuro:  Sleepy, moves extremities, follows simple commands HEENT:  Blind, BiPAP mask on Cardiovascular: regular, paced, no murmur Lungs:  B/l rales Abdomen:  Soft, non tender, decreased bowel sounds Musculoskeletal: Lt BKA Skin:  Venous stasis changes Rt lower leg  LABS:  CBC  Recent Labs Lab 08/07/13 1745  WBC 8.0  HGB 11.5*  HCT 36.1*  PLT 192   Coag's No results found for this basename: APTT, INR,  in the last 168 hours BMET  Recent Labs Lab 08/07/13 1745  NA 139  K 4.5  CL 98  CO2 30  BUN 22  CREATININE 1.59*  GLUCOSE 147*   Electrolytes  Recent Labs Lab  08/07/13 1745  CALCIUM 8.7  MG 2.7*   Sepsis Markers No results found for this basename: LATICACIDVEN, PROCALCITON, O2SATVEN,  in the last 168 hours ABG No results found for this basename: PHART, PCO2ART, PO2ART,  in the last 168 hours Liver  Enzymes  Recent Labs Lab 08/07/13 1745  AST 17  ALT 16  ALKPHOS 97  BILITOT 0.3  ALBUMIN 3.3*   Cardiac Enzymes  Recent Labs Lab 08/07/13 1745  TROPONINI <0.30   Glucose No results found for this basename: GLUCAP,  in the last 168 hours  Imaging Dg Chest Port 1 View  08/07/2013   CLINICAL DATA:  Headache cardia. History of coronary artery disease.  EXAM: PORTABLE CHEST - 1 VIEW  COMPARISON:  Chest x-ray 06/15/2013.  FINDINGS: Lung volumes are normal. Film is slightly under penetrated. With this limitation in mind, there is no definite acute consolidative airspace disease, and no definite pleural effusions. Pulmonary vasculature is normal. Heart size appears mildly enlarged, although this may be accentuated by portable technique, and patient rotation to the left, which also distorts upper mediastinal contours. Atherosclerosis in the thoracic aorta. Status post median sternotomy for CABG, including LIMA. External defibrillator pads are seen projecting over the mid to lower thorax bilaterally. Orthopedic fixation hardware in the lower cervical spine incidentally noted.  IMPRESSION: 1. No definite radiographic evidence of acute cardiopulmonary disease. 2. Heart size appears mildly enlarged. 3. Atherosclerosis. 4. Postoperative changes and support apparatus, as above.   Electronically Signed   By: Vinnie Langton M.D.   On: 08/07/2013 17:58    ASSESSMENT / PLAN:  PULMONARY A: Acute hypoxic respiratory failure 2nd to acute pulmonary edema. Probable sleep disordered breathing. Reported hx of COPD. P:   -continue BiPAP -f/u ABG, CXR -high risk need for intubation -prn BD's  CARDIOVASCULAR A:  Complete heart block s/p transvenous pacer 1/01. Acute on chronic systolic heart failure. Hx of CAD, hyperlipidemia, PVD, HTN. P:  -per cardiology  RENAL A:   CKD. P:   -monitor renal fx, urine outpt, electrolytes  GASTROINTESTINAL A:   Hx of hiatal hernia. P:   -clear liquid  diet -protonix  HEMATOLOGIC A:   No issues. P:  -f/u CBC -SQ heparin for DVT prevention  INFECTIOUS A:   No evidence for infection. P:   -Monitor clinically  ENDOCRINE A:   Insulin dependent DM. P:   -SSI  NEUROLOGIC A:   Peripheral neuropathy. P:   -hold neurotin, norco until clinical status improved  Goals of care >> Full code.  If clinical status worsens, then may need to have further discussions with family about appropriate goals of care.  Updated pt's wife, and d/w Dr. Ellyn Hack.  CC time 40 minutes.  Chesley Mires, MD [facility] Pulmonary/Critical Care 08/07/2013, 10:34 PM Pager:  [phone] After 3pm call: [phone]

## 2013-08-07 NOTE — ED Notes (Signed)
Dr. Harding at bedside.

## 2013-08-07 NOTE — ED Notes (Signed)
0.1mg  of Atropine given.

## 2013-08-08 ENCOUNTER — Encounter (HOSPITAL_COMMUNITY)
Admission: EM | Disposition: A | Discharge status: Home / Self Care | Payer: Self-pay | Source: Home / Self Care | Attending: Cardiovascular Disease

## 2013-08-08 ENCOUNTER — Inpatient Hospital Stay (HOSPITAL_COMMUNITY): Payer: Medicare Other | Admitting: Anesthesiology

## 2013-08-08 ENCOUNTER — Encounter (HOSPITAL_COMMUNITY): Payer: Medicare Other | Admitting: Anesthesiology

## 2013-08-08 ENCOUNTER — Inpatient Hospital Stay (HOSPITAL_COMMUNITY): Payer: Medicare Other

## 2013-08-08 ENCOUNTER — Encounter (HOSPITAL_COMMUNITY): Payer: Self-pay | Admitting: Anesthesiology

## 2013-08-08 DIAGNOSIS — I442 Atrioventricular block, complete: Principal | ICD-10-CM

## 2013-08-08 HISTORY — PX: BI-VENTRICULAR PACEMAKER INSERTION: SHX5462

## 2013-08-08 HISTORY — PX: PACEMAKER INSERTION: SHX728

## 2013-08-08 LAB — GLUCOSE, CAPILLARY
Glucose-Capillary: 182 mg/dL — ABNORMAL HIGH (ref 70–99)
Glucose-Capillary: 173 mg/dL — ABNORMAL HIGH (ref 70–99)
Glucose-Capillary: 213 mg/dL — ABNORMAL HIGH (ref 70–99)

## 2013-08-08 LAB — MAGNESIUM: Magnesium: 2.5 mg/dL (ref 1.5–2.5)

## 2013-08-08 LAB — CBC WITH DIFFERENTIAL/PLATELET
Basophils Absolute: 0 10*3/uL (ref 0.0–0.1)
Basophils Relative: 0 % (ref 0–1)
Eosinophils Absolute: 0 10*3/uL (ref 0.0–0.7)
Eosinophils Relative: 0 % (ref 0–5)
HCT: 39.2 % (ref 39.0–52.0)
Hemoglobin: 12.7 g/dL — ABNORMAL LOW (ref 13.0–17.0)
Lymphocytes Relative: 6 % — ABNORMAL LOW (ref 12–46)
Lymphs Abs: 0.5 10*3/uL — ABNORMAL LOW (ref 0.7–4.0)
MCH: 28.4 pg (ref 26.0–34.0)
MCHC: 32.4 g/dL (ref 30.0–36.0)
MCV: 87.7 fL (ref 78.0–100.0)
Monocytes Absolute: 0.1 10*3/uL (ref 0.1–1.0)
Monocytes Relative: 1 % — ABNORMAL LOW (ref 3–12)
Neutro Abs: 7.3 10*3/uL (ref 1.7–7.7)
Neutrophils Relative %: 92 % — ABNORMAL HIGH (ref 43–77)
Platelets: 200 10*3/uL (ref 150–400)
RBC: 4.47 MIL/uL (ref 4.22–5.81)
RDW: 14.4 % (ref 11.5–15.5)
WBC: 7.9 10*3/uL (ref 4.0–10.5)

## 2013-08-08 LAB — BASIC METABOLIC PANEL WITH GFR
BUN: 21 mg/dL (ref 6–23)
CO2: 29 meq/L (ref 19–32)
Calcium: 8.8 mg/dL (ref 8.4–10.5)
Chloride: 98 meq/L (ref 96–112)
Creatinine, Ser: 1.4 mg/dL — ABNORMAL HIGH (ref 0.50–1.35)
GFR calc Af Amer: 58 mL/min — ABNORMAL LOW
GFR calc non Af Amer: 50 mL/min — ABNORMAL LOW
Glucose, Bld: 221 mg/dL — ABNORMAL HIGH (ref 70–99)
Potassium: 5.1 meq/L (ref 3.7–5.3)
Sodium: 137 meq/L (ref 137–147)

## 2013-08-08 LAB — ALBUMIN: ALBUMIN: 3.2 g/dL — AB (ref 3.5–5.2)

## 2013-08-08 LAB — TROPONIN I
Troponin I: <0.3 ng/mL (ref <0.30)
Troponin I: <0.3 ng/mL (ref <0.30)

## 2013-08-08 LAB — PROTIME-INR
INR: 1.07 (ref 0.00–1.49)
Prothrombin Time: 13.7 seconds (ref 11.6–15.2)

## 2013-08-08 LAB — MRSA PCR SCREENING: MRSA by PCR: NEGATIVE

## 2013-08-08 SURGERY — BI-VENTRICULAR PACEMAKER INSERTION (CRT-P)
Anesthesia: Monitor Anesthesia Care

## 2013-08-08 MED ORDER — INSULIN ASPART 100 UNIT/ML ~~LOC~~ SOLN
0.0000 [IU] | SUBCUTANEOUS | Status: DC
  Administered 2013-08-08: 3 [IU] via SUBCUTANEOUS

## 2013-08-08 MED ORDER — ONDANSETRON HCL 4 MG/2ML IJ SOLN: 4.0000 mg | Freq: Four times a day (QID) | INTRAMUSCULAR | Status: DC | PRN

## 2013-08-08 MED ORDER — CHLORHEXIDINE GLUCONATE 4 % EX LIQD
60.0000 mL | Freq: Once | CUTANEOUS | Status: AC
  Administered 2013-08-08: 4 via TOPICAL
  Filled 2013-08-08: qty 60

## 2013-08-08 MED ORDER — SODIUM CHLORIDE 0.45 % IV SOLN: INTRAVENOUS | Status: DC

## 2013-08-08 MED ORDER — CEFAZOLIN SODIUM-DEXTROSE 2-3 GM-% IV SOLR
INTRAVENOUS | Status: DC | PRN
  Administered 2013-08-08: 2 g via INTRAVENOUS

## 2013-08-08 MED ORDER — ACETAMINOPHEN 325 MG PO TABS: 325.0000 mg | ORAL_TABLET | ORAL | Status: DC | PRN

## 2013-08-08 MED ORDER — LOSARTAN POTASSIUM 50 MG PO TABS
50.0000 mg | ORAL_TABLET | Freq: Every day | ORAL | Status: DC
  Administered 2013-08-09 – 2013-08-12 (×4): 50 mg via ORAL
  Filled 2013-08-08 (×4): qty 1

## 2013-08-08 MED ORDER — SODIUM CHLORIDE 0.9 % IJ SOLN
3.0000 mL | INTRAMUSCULAR | Status: DC | PRN
  Administered 2013-08-09: 3 mL via INTRAVENOUS

## 2013-08-08 MED ORDER — SODIUM CHLORIDE 0.9 % IV SOLN: INTRAVENOUS | Status: DC

## 2013-08-08 MED ORDER — FENTANYL CITRATE 0.05 MG/ML IJ SOLN: 25.0000 ug | INTRAMUSCULAR | Status: DC | PRN

## 2013-08-08 MED ORDER — BIOTENE DRY MOUTH MT LIQD
15.0000 mL | Freq: Two times a day (BID) | OROMUCOSAL | Status: DC
  Administered 2013-08-08 – 2013-08-11 (×6): 15 mL via OROMUCOSAL

## 2013-08-08 MED ORDER — ONDANSETRON HCL 4 MG/2ML IJ SOLN
4.0000 mg | Freq: Four times a day (QID) | INTRAMUSCULAR | Status: DC | PRN
  Administered 2013-08-09: 4 mg via INTRAVENOUS
  Filled 2013-08-08: qty 2

## 2013-08-08 MED ORDER — PANTOPRAZOLE SODIUM 40 MG PO TBEC
40.0000 mg | DELAYED_RELEASE_TABLET | Freq: Every day | ORAL | Status: DC
Start: 2013-08-08 — End: 2013-08-12
  Administered 2013-08-08 – 2013-08-12 (×5): 40 mg via ORAL
  Filled 2013-08-08 (×5): qty 1

## 2013-08-08 MED ORDER — ALPRAZOLAM 0.25 MG PO TABS
0.2500 mg | ORAL_TABLET | Freq: Three times a day (TID) | ORAL | Status: DC | PRN
  Administered 2013-08-08 – 2013-08-09 (×2): 0.25 mg via ORAL
  Filled 2013-08-08 (×2): qty 1

## 2013-08-08 MED ORDER — FENTANYL CITRATE 0.05 MG/ML IJ SOLN
INTRAMUSCULAR | Status: DC | PRN
  Administered 2013-08-08: 100 ug via INTRAVENOUS

## 2013-08-08 MED ORDER — FAMOTIDINE IN NACL 20-0.9 MG/50ML-% IV SOLN
20.0000 mg | INTRAVENOUS | Status: AC
  Administered 2013-08-08: 20 mg via INTRAVENOUS
  Filled 2013-08-08: qty 50

## 2013-08-08 MED ORDER — CARVEDILOL 6.25 MG PO TABS
6.2500 mg | ORAL_TABLET | Freq: Two times a day (BID) | ORAL | Status: DC
  Administered 2013-08-08 – 2013-08-12 (×8): 6.25 mg via ORAL
  Filled 2013-08-08 (×10): qty 1

## 2013-08-08 MED ORDER — SODIUM CHLORIDE 0.9 % IV SOLN: 250.0000 mL | INTRAVENOUS | Status: DC | PRN

## 2013-08-08 MED ORDER — SODIUM CHLORIDE 0.9 % IR SOLN
80.0000 mg | Status: DC
  Filled 2013-08-08: qty 2

## 2013-08-08 MED ORDER — GABAPENTIN 600 MG PO TABS
600.0000 mg | ORAL_TABLET | Freq: Three times a day (TID) | ORAL | Status: DC
  Administered 2013-08-08 – 2013-08-12 (×11): 600 mg via ORAL
  Filled 2013-08-08 (×14): qty 1

## 2013-08-08 MED ORDER — METHYLPREDNISOLONE SODIUM SUCC 125 MG IJ SOLR
125.0000 mg | INTRAMUSCULAR | Status: AC
  Administered 2013-08-08: 125 mg via INTRAVENOUS
  Filled 2013-08-08: qty 2

## 2013-08-08 MED ORDER — NEOSTIGMINE METHYLSULFATE 1 MG/ML IJ SOLN
INTRAMUSCULAR | Status: DC | PRN
  Administered 2013-08-08: 4 mg via INTRAVENOUS

## 2013-08-08 MED ORDER — GLYCOPYRROLATE 0.2 MG/ML IJ SOLN
INTRAMUSCULAR | Status: DC | PRN
  Administered 2013-08-08: .7 mg via INTRAVENOUS

## 2013-08-08 MED ORDER — INSULIN ASPART 100 UNIT/ML ~~LOC~~ SOLN
0.0000 [IU] | Freq: Three times a day (TID) | SUBCUTANEOUS | Status: DC
Start: 2013-08-09 — End: 2013-08-12
  Administered 2013-08-09 (×3): 2 [IU] via SUBCUTANEOUS
  Administered 2013-08-10 (×2): 3 [IU] via SUBCUTANEOUS
  Administered 2013-08-11: 2 [IU] via SUBCUTANEOUS
  Administered 2013-08-11 (×2): 3 [IU] via SUBCUTANEOUS
  Administered 2013-08-12 (×2): 5 [IU] via SUBCUTANEOUS

## 2013-08-08 MED ORDER — CEFAZOLIN SODIUM-DEXTROSE 2-3 GM-% IV SOLR
2.0000 g | INTRAVENOUS | Status: DC
  Filled 2013-08-08: qty 50

## 2013-08-08 MED ORDER — IPRATROPIUM BROMIDE 0.02 % IN SOLN
500.0000 ug | Freq: Four times a day (QID) | RESPIRATORY_TRACT | Status: DC | PRN
  Administered 2013-08-12: 500 ug via RESPIRATORY_TRACT
  Filled 2013-08-08: qty 2.5

## 2013-08-08 MED ORDER — CEFAZOLIN SODIUM 1-5 GM-% IV SOLN
1.0000 g | Freq: Four times a day (QID) | INTRAVENOUS | Status: AC
  Administered 2013-08-08 – 2013-08-09 (×3): 1 g via INTRAVENOUS
  Filled 2013-08-08 (×3): qty 50

## 2013-08-08 MED ORDER — SODIUM CHLORIDE 0.9 % IJ SOLN
3.0000 mL | Freq: Two times a day (BID) | INTRAMUSCULAR | Status: DC
  Administered 2013-08-08 – 2013-08-11 (×3): 3 mL via INTRAVENOUS
  Administered 2013-08-11: 11:00:00 via INTRAVENOUS

## 2013-08-08 MED ORDER — BUDESONIDE 0.25 MG/2ML IN SUSP
0.2500 mg | Freq: Every day | RESPIRATORY_TRACT | Status: DC | PRN
  Filled 2013-08-08: qty 2

## 2013-08-08 MED ORDER — DIPHENHYDRAMINE HCL 50 MG/ML IJ SOLN
25.0000 mg | INTRAMUSCULAR | Status: AC
  Administered 2013-08-08: 25 mg via INTRAVENOUS
  Filled 2013-08-08: qty 1

## 2013-08-08 MED ORDER — ROCURONIUM BROMIDE 100 MG/10ML IV SOLN
INTRAVENOUS | Status: DC | PRN
  Administered 2013-08-08: 30 mg via INTRAVENOUS
  Administered 2013-08-08: 10 mg via INTRAVENOUS

## 2013-08-08 MED ORDER — ISOSORBIDE MONONITRATE ER 30 MG PO TB24
30.0000 mg | ORAL_TABLET | Freq: Every day | ORAL | Status: DC
  Administered 2013-08-08 – 2013-08-12 (×5): 30 mg via ORAL
  Filled 2013-08-08 (×5): qty 1

## 2013-08-08 MED ORDER — FUROSEMIDE 10 MG/ML IJ SOLN
60.0000 mg | Freq: Every day | INTRAMUSCULAR | Status: DC
  Filled 2013-08-08: qty 6

## 2013-08-08 MED ORDER — PROPOFOL 10 MG/ML IV BOLUS
INTRAVENOUS | Status: DC | PRN
  Administered 2013-08-08: 150 mg via INTRAVENOUS

## 2013-08-08 MED ORDER — PHENYLEPHRINE HCL 10 MG/ML IJ SOLN
INTRAMUSCULAR | Status: DC | PRN
  Administered 2013-08-08 (×2): 80 ug via INTRAVENOUS

## 2013-08-08 MED ORDER — SUCCINYLCHOLINE CHLORIDE 20 MG/ML IJ SOLN: INTRAMUSCULAR | Status: DC | PRN

## 2013-08-08 MED ORDER — HYDROCODONE-ACETAMINOPHEN 10-325 MG PO TABS
1.0000 | ORAL_TABLET | ORAL | Status: DC | PRN
  Administered 2013-08-08 – 2013-08-12 (×16): 1 via ORAL
  Filled 2013-08-08 (×16): qty 1

## 2013-08-08 MED FILL — Medication: Qty: 1 | Status: AC

## 2013-08-08 NOTE — Consult Note (Signed)
WOC wound consult note Reason for Consult: Consult requested for right leg wound.  Pt is in cath lab for a procedure and unable to assess.  Bedside nurse assessed wound and measured it previously; discussed plan of care with bedside nurse. Description and measurements are from her report: Wound type:Right leg with full thickness wound of unknown etiology. Measurement: 2X1X.2cm Wound bed: red and moist Drainage (amount, consistency, odor) mod amt green drainage, no odor Periwound: intact skin surrounding Dressing procedure/placement/frequency: Foam dressing to protect and absorb drainage.  Plan to assess Monday if pt still in the hospital at that time. Julien Girt MSN, RN, [place], [place], [place]

## 2013-08-08 NOTE — Care Management Note (Addendum)
  Page 2 of 2   08/11/2013     11:08:00 AM   CARE MANAGEMENT NOTE 08/11/2013  Patient:  Mark French, Mark French   Account Number:  [id number]  Date Initiated:  08/08/2013  Documentation initiated by:  Elissa Hefty  Subjective/Objective Assessment:   adm w heart block     Action/Plan:   lives w wife, pcp dr [name]   Anticipated DC Date:     Anticipated DC Plan:        [place]  CM consult      [place]   Choice offered to / List presented to:  C-1 Patient        [place] arranged  HH-1 RN  [place].   Status of service:  Completed, signed off Medicare Important Message given?   (If response is "NO", the following Medicare IM given date fields will be blank) Date Medicare IM given:   Date Additional Medicare IM given:    Discharge Disposition:  [place]  Per UR Regulation:  Reviewed for med. necessity/level of care/duration of stay  If discussed at Long Length of Stay Meetings, dates discussed:    Comments:  08-11-12 1104 Jacqlyn Krauss, RN,BSN [phone] CM did speak to pt and wife in regards to [facility] services. Pt and wife agreeable to HHRN/ PT services. MD please write orders for services listed and SOC to begin within 24-48 hours post d/c. Referral for services has been made. Pt still receiving IV lasix at this time. CM will continue to monitor.

## 2013-08-08 NOTE — H&P (View-Only) (Signed)
Patient: Mark French Date of Encounter: 08/08/2013, 7:17 AM Admit date: 08/07/2013     Subjective  Mark French states he does not recall events from last night. He denies CP or SOB currently.   Objective  Physical Exam: Vitals: BP 127/59  Pulse 78  Temp(Src) 97.8 F (36.6 C) (Axillary)  Resp 18  Ht 5\' 5"  (1.651 m)  Wt 227 lb 11.8 oz (103.3 kg)  BMI 37.90 kg/m2  SpO2 95% General: Well developed, chronically ill appearing 68 year old male in no acute distress. Neck: Supple. JVD not elevated. Lungs: Diminished breath sounds throughout. No wheezes, rales, or rhonchi. Breathing is unlabored. Heart: Regular S1 S2 without murmurs, rubs, or gallops.  Abdomen: Soft, non-distended. Extremities: No clubbing or cyanosis. No edema.   Neuro: Alert and oriented X 3. Moves all extremities spontaneously. No focal deficits.  Intake/Output:  Intake/Output Summary (Last 24 hours) at 08/08/13 0717 Last data filed at 08/08/13 0600  Gross per 24 hour  Intake    600 ml  Output   3700 ml  Net  -3100 ml    Inpatient Medications:  . aspirin  324 mg Oral NOW   Or  . aspirin  300 mg Rectal NOW  . aspirin EC  81 mg Oral Daily  . atorvastatin  40 mg Oral q1800  . atropine  0.2 mg Intravenous Once  . atropine  1 mg Intravenous Once  . clopidogrel  75 mg Oral Q breakfast  . furosemide  80 mg Intravenous BID  . heparin  5,000 Units Subcutaneous Q8H  . sodium chloride  3 mL Intravenous Q12H  . sodium chloride  3 mL Intravenous Q12H  . spironolactone  25 mg Oral Daily   . nitroGLYCERIN      Labs:  Recent Labs  08/07/13 1745 08/08/13 0500  NA 139 137  K 4.5 5.1  CL 98 98  CO2 30 29  GLUCOSE 147* 221*  BUN 22 21  CREATININE 1.59* 1.40*  CALCIUM 8.7 8.8  MG 2.7* 2.5    Recent Labs  08/07/13 1745 08/08/13 0500  AST 17  --   ALT 16  --   ALKPHOS 97  --   BILITOT 0.3  --   PROT 7.1  --   ALBUMIN 3.3* 3.2*    Recent Labs  08/07/13 1745 08/08/13 0500  WBC 8.0 7.9    NEUTROABS  --  7.3  HGB 11.5* 12.7*  HCT 36.1* 39.2  MCV 88.7 87.7  PLT 192 200    Recent Labs  08/07/13 0054 08/07/13 1745  TROPONINI <0.30 <0.30    Radiology/Studies: Dg Chest Port 1 View  08/08/2013   CLINICAL DATA:  CHF.  EXAM: PORTABLE CHEST - 1 VIEW  COMPARISON:  08/07/2013.  FINDINGS: Prior CABG. Cardiomegaly. Normal pulmonary vascularity. Pacing leads are noted over the chest. Atelectatic changes left lung base. No pleural effusion or pneumothorax.  IMPRESSION: 1. Prior CABG.  Cardiomegaly.  No pulmonary venous congestion . 2. Mild atelectatic changes left lung base.   Electronically Signed   By: Marcello Moores  Register   On: 08/08/2013 07:05   Dg Chest Port 1 View  08/07/2013   CLINICAL DATA:  Headache cardia. History of coronary artery disease.  EXAM: PORTABLE CHEST - 1 VIEW  COMPARISON:  Chest x-ray 06/15/2013.  FINDINGS: Lung volumes are normal. Film is slightly under penetrated. With this limitation in mind, there is no definite acute consolidative airspace disease, and no definite pleural effusions. Pulmonary vasculature is  normal. Heart size appears mildly enlarged, although this may be accentuated by portable technique, and patient rotation to the left, which also distorts upper mediastinal contours. Atherosclerosis in the thoracic aorta. Status post median sternotomy for CABG, including LIMA. External defibrillator pads are seen projecting over the mid to lower thorax bilaterally. Orthopedic fixation hardware in the lower cervical spine incidentally noted.  IMPRESSION: 1. No definite radiographic evidence of acute cardiopulmonary disease. 2. Heart size appears mildly enlarged. 3. Atherosclerosis. 4. Postoperative changes and support apparatus, as above.   Electronically Signed   By: Daniel  Entrikin M.D.   On: 08/07/2013 17:58   Echocardiogram 07/16/2013 Study Conclusions Left ventricle: The cavity size was mildly dilated. Wall thickness was normal. Systolic function was  moderately reduced. The estimated ejection fraction was in the range of 35% to 40%. Severe hypokinesis of the inferior myocardium.  Cardiac cath yesterday Hemodynamics:  Central Aortic / Mean Pressures: 150/63 mmHg; 93 mmHg  Left Ventricular Pressures / EDP: 149/28 mmHg; 32 mmHg Left Ventriculography: Not performed  Coronary Anatomy:  Left Main: Moderate caliber vessel that bifurcates into the LAD & Circumflex; angiographically normal LAD: Patent proximally with large proximal septal trunk (with faint L-R collaterals to RPDA) followed by extensive calcification and 100% mid occlusion. Small proximal Diag.  D2: Perfused by patent SVG Left Circumflex: 100% proximal occlusion after Very small marginal branch.  RCA: Small to moderate caliber, dominant vessel with severe diffuse proximal and distal disease, yet TIMI 1 flow to the small caliber PDA.  Graft Anatomy:  LIMA-LAD: widely patent to a diminutive target LAD that wraps the apex; retrograde flow to a large septal perforator  SVG-Diag: widely patent with ~30-40% "higne-point" in mid vessel; stent is widely patent.  SVG-OM & SVG-RCA: known occlusion. PATIENT DISPOSITION:  The patient was transferred to the PACU holding area in a hemodynamicaly stable, chest pain free condition.  The patient tolerated the procedure well, and there were no complications. EBL: < 15 ml  The patient was stable before, during, and after the procedure. POST-OPERATIVE DIAGNOSIS:  Complete Heart Block, s/ Successful Temporary Transvenous Pacemaker placemnt  Stable severe Native & Graft Anatomy  Severely elevated LVEDP with known Ischemic CM  Telemetry: V paced   Assessment and Plan  1. 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 Ischemic CM, EF 35-40%   - continue medical therapy 3. Chronic systolic HF - had acute respiratory failure after cath now improved with diuresis; now off BiPAP 4. CAD s/p CABG 1998, s/p inferior STEMI Jan 2013 s/p DES to SVG-diagonal - stable s/p emergent cath yesterday  Dr. Kynli Chou to see Signed, EDMISTEN, [name] PA-C  I have seen, examined the patient, and reviewed the above assessment and plan.  Changes to above are made where necessary.  The patient has chronic systolic dysfunction/ ischemic CM (EF 35-40%) for which he requires medical management (including coreg) long term.  He has longstanding advanced conduction disease with trifascicular block previously demonstrated.  Despite holding coreg (which he will require long term), his complete heart block persists.  The patient has symptomatic complete heart block with a temporary pacing wire in place.  I would therefore recommend biV pacemaker implantation at this time.  Given EF > 35%, he is not a candidate for ICD.    Risks, benefits, alternatives to biv pacemaker implantation were discussed in detail with the patient today. The patient understands that the risks include but are not limited to bleeding, infection, pneumothorax, perforation, tamponade, vascular damage, renal failure, MI, stroke, death,  and lead dislodgement and wishes to proceed. We will therefore schedule the procedure at the next available time. Given his chronic pain and inability to lie still, I will likely ask for anesthesia assistance with his procedure.  Co Sign: Thompson Grayer, MD 08/08/2013 9:06 AM

## 2013-08-08 NOTE — Anesthesia Postprocedure Evaluation (Signed)
Anesthesia Post Note  Patient: Mark French  Procedure(s) Performed: Procedure(s) (LRB): BI-VENTRICULAR PACEMAKER INSERTION (CRT-P) (N/A)  Anesthesia type: General  Patient location: PACU  Post pain: Pain level controlled and Adequate analgesia  Post assessment: Post-op Vital signs reviewed, Patient's Cardiovascular Status Stable, Respiratory Function Stable, Patent Airway and Pain level controlled  Last Vitals:  Filed Vitals:   08/08/13 1100  BP: 98/25  Pulse: 79  Temp:   Resp: 20    Post vital signs: Reviewed and stable  Level of consciousness: awake, alert  and oriented  Complications: No apparent anesthesia complications

## 2013-08-08 NOTE — Transfer of Care (Signed)
Immediate Anesthesia Transfer of Care Note  Patient: Mark French  Procedure(s) Performed: Procedure(s): BI-VENTRICULAR PACEMAKER INSERTION (CRT-P) (N/A)  Patient Location: PACU  Anesthesia Type:General  Level of Consciousness: awake and alert   Airway & Oxygen Therapy: Patient Spontanous Breathing and Patient connected to face mask oxygen  Post-op Assessment: Report given to PACU RN and Post -op Vital signs reviewed and stable  Post vital signs: Reviewed and stable  Complications: No apparent anesthesia complications

## 2013-08-08 NOTE — Progress Notes (Signed)
Doing well s/p PPM Complains of chronic peripheral neuropathy pain. We will resume his neurontin and home pain medicine.  I do not think that there is a roll for IV pain medicine presently as these will cause confusion and may exacerbate respiratory failure. If stable from a CV standpoint tomorrow, could transfer to telemetry for further diuresis. Possibly home Sunday or Monday

## 2013-08-08 NOTE — Anesthesia Preprocedure Evaluation (Signed)
Anesthesia Evaluation  Patient identified by MRN, date of birth, ID band Patient awake    Reviewed: Allergy & Precautions, H&P , NPO status , Patient's Chart, lab work & pertinent test results  Airway Mallampati: III  Neck ROM: full    Dental   Pulmonary COPD         Cardiovascular hypertension, + angina + CAD, + Past MI, + CABG, + Peripheral Vascular Disease and +CHF     Neuro/Psych Depression  Neuromuscular disease CVA    GI/Hepatic hiatal hernia,   Endo/Other  diabetes, Type 2Morbid obesity  Renal/GU Renal InsufficiencyRenal disease     Musculoskeletal  (+) Arthritis -,   Abdominal   Peds  Hematology   Anesthesia Other Findings   Reproductive/Obstetrics                           Anesthesia Physical Anesthesia Plan  ASA: IV  Anesthesia Plan: MAC   Post-op Pain Management:    Induction: Intravenous  Airway Management Planned: Simple Face Mask  Additional Equipment:   Intra-op Plan:   Post-operative Plan:   Informed Consent: I have reviewed the patients History and Physical, chart, labs and discussed the procedure including the risks, benefits and alternatives for the proposed anesthesia with the patient or authorized representative who has indicated his/her understanding and acceptance.     Plan Discussed with: CRNA, Anesthesiologist and Surgeon  Anesthesia Plan Comments:         Anesthesia Quick Evaluation

## 2013-08-08 NOTE — Progress Notes (Signed)
PULMONARY / CRITICAL CARE MEDICINE  Name: Mark French MRN: [id number] DOB: 04-09-1946    ADMISSION DATE:  08/07/2013 CONSULTATION DATE:  08/07/2013  REFERRING MD :  Glenetta Hew  CHIEF COMPLAINT:  Short of breath  BRIEF PATIENT DESCRIPTION: 68 yo male presented with syncope, dyspnea, and chest pain.  He has hx of CAD, systolic CHF.  He was found to have complete heart block, and taken to cath lab for transvenous pacer.  He developed acute pulmonary edema, and PCCM consulted to assist with respiratory management.  SIGNIFICANT EVENTS: 1/1  Complete heart block,  transvenous pacer, respiratory distress >> improved with NTG, lasix, BiPAP, pacing  STUDIES:  1/1 L heart cath >>> Stable severe native and graft anatomy  LINES / TUBES: Transvenous pacer 1/1 >>>  CULTURES:  ANTIBIOTICS:  SUBJECTIVE: Improved significantly overnight, now on 2 L cannula.  VITAL SIGNS: Temp:  [97.5 F (36.4 C)-98.1 F (36.7 C)] 98.1 F (36.7 C) (01/02 0800) Pulse Rate:  [29-124] 80 (01/02 1000) Resp:  [9-31] 17 (01/02 1000) BP: (92-156)/(25-90) 132/51 mmHg (01/02 0940) SpO2:  [88 %-100 %] 96 % (01/02 1000) FiO2 (%):  [40 %-100 %] 40 % (01/02 0400) Weight:  [103.3 kg (227 lb 11.8 oz)-105.5 kg (232 lb 9.4 oz)] 103.3 kg (227 lb 11.8 oz) (01/02 0500)  HEMODYNAMICS:   VENTILATOR SETTINGS: Vent Mode:  [-] BIPAP FiO2 (%):  [40 %-100 %] 40 % Set Rate:  [12 bmp-15 bmp] 12 bmp INTAKE / OUTPUT: Intake/Output     01/01 0701 - 01/02 0700 01/02 0701 - 01/03 0700   I.V. (mL/kg) 325 (3.1) 58 (0.6)   IV Piggyback 300    Total Intake(mL/kg) 625 (6.1) 58 (0.6)   Urine (mL/kg/hr) 3700    Total Output 3700     Net -3075 +58         PHYSICAL EXAMINATION: General:  Comfortable, no distress Neuro:  Awake, alert, cooperative HEENT:  No JVD Cardiovascular: Paced, no murmurs Lungs:  Bilateral air entry, few bibasilar rales, few wheezes Abdomen:  Soft, non tender, bowel sounds present Musculoskeletal: L  BKA Skin:  Venous stasis changes R lower leg  LABS:  CBC  Recent Labs Lab 08/07/13 1745 08/08/13 0500  WBC 8.0 7.9  HGB 11.5* 12.7*  HCT 36.1* 39.2  PLT 192 200   Coag's No results found for this basename: APTT, INR,  in the last 168 hours BMET  Recent Labs Lab 08/07/13 1745 08/08/13 0500  NA 139 137  K 4.5 5.1  CL 98 98  CO2 30 29  BUN 22 21  CREATININE 1.59* 1.40*  GLUCOSE 147* 221*   Electrolytes  Recent Labs Lab 08/07/13 1745 08/08/13 0500  CALCIUM 8.7 8.8  MG 2.7* 2.5   Sepsis Markers No results found for this basename: LATICACIDVEN, PROCALCITON, O2SATVEN,  in the last 168 hours ABG  Recent Labs Lab 08/07/13 2248  PHART 7.440  PCO2ART 44.1  PO2ART 195.0*   Liver Enzymes  Recent Labs Lab 08/07/13 1745 08/08/13 0500  AST 17  --   ALT 16  --   ALKPHOS 97  --   BILITOT 0.3  --   ALBUMIN 3.3* 3.2*   Cardiac Enzymes  Recent Labs Lab 08/07/13 0054 08/07/13 1745 08/08/13 0717  TROPONINI <0.30 <0.30 <0.30  PROBNP  --  568.0*  --    Glucose  Recent Labs Lab 08/07/13 2316 08/08/13 0835  GLUCAP 201* 182*   CXR:  1/2 >>> No significant airspace disease  ASSESSMENT /  PLAN:  PULMONARY A: Acute hypoxic respiratory failure 2/2 acute pulmonary edema. Probable sleep disordered breathing. Reported hx of COPD. P:   Goal SpO2 > 92 Supplemental oxygen PRN D/c BiPAP Albuterol PRN  CARDIOVASCULAR A:  Complete heart block s/p transvenous pacer 1/1. Acute on chronic systolic heart failure. Hx of CAD, hyperlipidemia, PVD, HTN. P:  Cardiology following Permanent pacer today ASA, Plavix, Lipitor Consider d/c Nitro gtt  RENAL A:   CKD. P:   Trend BMP Aldactone  GASTROINTESTINAL A:   Nutrition. Hx of hiatal hernia. P:   NPO for procedure Pepcid  HEMATOLOGIC A:   VTE Px. P:  Trend CBC SCDs  INFECTIOUS A:   No evidence for infection. P:   No intervention required  ENDOCRINE A:   Insulin dependent DM. P:    SSI  NEUROLOGIC A:   Peripheral neuropathy. P:   Norco, Morphine Hold Neurontin  PCCM will sign off. Please re consult PRN.  I have personally obtained history, examined patient, evaluated and interpreted laboratory and imaging results, reviewed medical records, formulated assessment / plan and placed orders.  Doree Fudge, MD Pulmonary and [place] Pager: [phone]  08/08/2013, 10:26 AM

## 2013-08-08 NOTE — Progress Notes (Addendum)
Patient: Mark French Date of Encounter: 08/08/2013, 7:17 AM Admit date: 08/07/2013     Subjective  [name] states he does not recall events from last night. He denies CP or SOB currently.   Objective  Physical Exam: Vitals: BP 127/59  Pulse 78  Temp(Src) 97.8 F (36.6 C) (Axillary)  Resp 18  Ht 5\' 5"  (1.651 m)  Wt 227 lb 11.8 oz (103.3 kg)  BMI 37.90 kg/m2  SpO2 95% General: Well developed, chronically ill appearing 68 year old male in no acute distress. Neck: Supple. JVD not elevated. Lungs: Diminished breath sounds throughout. No wheezes, rales, or rhonchi. Breathing is unlabored. Heart: Regular S1 S2 without murmurs, rubs, or gallops.  Abdomen: Soft, non-distended. Extremities: No clubbing or cyanosis. No edema.   Neuro: Alert and oriented X 3. Moves all extremities spontaneously. No focal deficits.  Intake/Output:  Intake/Output Summary (Last 24 hours) at 08/08/13 0717 Last data filed at 08/08/13 0600  Gross per 24 hour  Intake    600 ml  Output   3700 ml  Net  -3100 ml    Inpatient Medications:  . aspirin  324 mg Oral NOW   Or  . aspirin  300 mg Rectal NOW  . aspirin EC  81 mg Oral Daily  . atorvastatin  40 mg Oral q1800  . atropine  0.2 mg Intravenous Once  . atropine  1 mg Intravenous Once  . clopidogrel  75 mg Oral Q breakfast  . furosemide  80 mg Intravenous BID  . heparin  5,000 Units Subcutaneous Q8H  . sodium chloride  3 mL Intravenous Q12H  . sodium chloride  3 mL Intravenous Q12H  . spironolactone  25 mg Oral Daily   . nitroGLYCERIN      Labs:  Recent Labs  08/07/13 1745 08/08/13 0500  NA 139 137  K 4.5 5.1  CL 98 98  CO2 30 29  GLUCOSE 147* 221*  BUN 22 21  CREATININE 1.59* 1.40*  CALCIUM 8.7 8.8  MG 2.7* 2.5    Recent Labs  08/07/13 1745 08/08/13 0500  AST 17  --   ALT 16  --   ALKPHOS 97  --   BILITOT 0.3  --   PROT 7.1  --   ALBUMIN 3.3* 3.2*    Recent Labs  08/07/13 1745 08/08/13 0500  WBC 8.0 7.9    NEUTROABS  --  7.3  HGB 11.5* 12.7*  HCT 36.1* 39.2  MCV 88.7 87.7  PLT 192 200    Recent Labs  08/07/13 0054 08/07/13 1745  TROPONINI <0.30 <0.30    Radiology/Studies: Dg Chest Port 1 View  08/08/2013   CLINICAL DATA:  CHF.  EXAM: PORTABLE CHEST - 1 VIEW  COMPARISON:  08/07/2013.  FINDINGS: Prior CABG. Cardiomegaly. Normal pulmonary vascularity. Pacing leads are noted over the chest. Atelectatic changes left lung base. No pleural effusion or pneumothorax.  IMPRESSION: 1. Prior CABG.  Cardiomegaly.  No pulmonary venous congestion . 2. Mild atelectatic changes left lung base.   Electronically Signed   By: Marcello Moores  Register   On: 08/08/2013 07:05   Dg Chest Port 1 View  08/07/2013   CLINICAL DATA:  Headache cardia. History of coronary artery disease.  EXAM: PORTABLE CHEST - 1 VIEW  COMPARISON:  Chest x-ray 06/15/2013.  FINDINGS: Lung volumes are normal. Film is slightly under penetrated. With this limitation in mind, there is no definite acute consolidative airspace disease, and no definite pleural effusions. Pulmonary vasculature is  normal. Heart size appears mildly enlarged, although this may be accentuated by portable technique, and patient rotation to the left, which also distorts upper mediastinal contours. Atherosclerosis in the thoracic aorta. Status post median sternotomy for CABG, including LIMA. External defibrillator pads are seen projecting over the mid to lower thorax bilaterally. Orthopedic fixation hardware in the lower cervical spine incidentally noted.  IMPRESSION: 1. No definite radiographic evidence of acute cardiopulmonary disease. 2. Heart size appears mildly enlarged. 3. Atherosclerosis. 4. Postoperative changes and support apparatus, as above.   Electronically Signed   By: Vinnie Langton M.D.   On: 08/07/2013 17:58   Echocardiogram 07/16/2013 Study Conclusions Left ventricle: The cavity size was mildly dilated. Wall thickness was normal. Systolic function was  moderately reduced. The estimated ejection fraction was in the range of 35% to 40%. Severe hypokinesis of the inferior myocardium.  Cardiac cath yesterday Hemodynamics:  Central Aortic / Mean Pressures: 150/63 mmHg; 93 mmHg  Left Ventricular Pressures / EDP: 149/28 mmHg; 32 mmHg Left Ventriculography: Not performed  Coronary Anatomy:  Left Main: Moderate caliber vessel that bifurcates into the LAD & Circumflex; angiographically normal LAD: Patent proximally with large proximal septal trunk (with faint L-R collaterals to RPDA) followed by extensive calcification and 100% mid occlusion. Small proximal Diag.  D2: Perfused by patent SVG Left Circumflex: 100% proximal occlusion after Very small marginal branch.  RCA: Small to moderate caliber, dominant vessel with severe diffuse proximal and distal disease, yet TIMI 1 flow to the small caliber PDA.  Graft Anatomy:  LIMA-LAD: widely patent to a diminutive target LAD that wraps the apex; retrograde flow to a large septal perforator  SVG-Diag: widely patent with ~30-40% "higne-point" in mid vessel; stent is widely patent.  SVG-OM & SVG-RCA: known occlusion. PATIENT DISPOSITION:  The patient was transferred to the PACU holding area in a hemodynamicaly stable, chest pain free condition.  The patient tolerated the procedure well, and there were no complications. EBL: < 15 ml  The patient was stable before, during, and after the procedure. POST-OPERATIVE DIAGNOSIS:  Complete Heart Block, s/ Successful Temporary Transvenous Pacemaker placemnt  Stable severe Native & Graft Anatomy  Severely elevated LVEDP with known Ischemic CM  Telemetry: V paced   Assessment and Plan  1. 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 Ischemic CM, EF 35-40%   - continue medical therapy 3. Chronic systolic HF - had acute respiratory failure after cath now improved with diuresis; now off BiPAP 4. CAD s/p CABG 1998, s/p inferior STEMI Jan 2013 s/p DES to SVG-diagonal - stable s/p emergent cath yesterday  Dr. Rayann Heman to see Signed, EDMISTEN, [name] PA-C  I have seen, examined the patient, and reviewed the above assessment and plan.  Changes to above are made where necessary.  The patient has chronic systolic dysfunction/ ischemic CM (EF 35-40%) for which he requires medical management (including coreg) long term.  He has longstanding advanced conduction disease with trifascicular block previously demonstrated.  Despite holding coreg (which he will require long term), his complete heart block persists.  The patient has symptomatic complete heart block with a temporary pacing wire in place.  I would therefore recommend biV pacemaker implantation at this time.  Given EF > 35%, he is not a candidate for ICD.  Risks, benefits, alternatives to biv pacemaker implantation were discussed in detail with the patient today. The patient understands that the risks include but are not limited to bleeding, infection, pneumothorax, perforation, tamponade, vascular damage, renal failure, MI, stroke, death,  and lead dislodgement and wishes to proceed. We will therefore schedule the procedure at the next available time. Given his chronic pain and inability to lie still, I will likely ask for anesthesia assistance with his procedure.  Co Sign: Thompson Grayer, MD 08/08/2013 9:06 AM

## 2013-08-08 NOTE — Preoperative (Signed)
Beta Blockers   Reason not to administer Beta Blockers:Not Applicable 

## 2013-08-08 NOTE — Op Note (Signed)
SURGEON:  Thompson Grayer, MD     PREPROCEDURE DIAGNOSIS:  Symptomatic complete heart block, chronic systolic dysfunction, ischemic CM (EF 40%)    POSTPROCEDURE DIAGNOSIS:   Symptomatic complete heart block, chronic systolic dysfunction, ischemic CM (EF 40%)     PROCEDURES:   1.  Biventricular pacemaker implantation.     INTRODUCTION:  Mark French is a 68 y.o. male with a history of symptomatic complete heart block, chronic systolic dysfunction, ischemic CM (EF 35-40%) and CRI who presents today for biventricular pacemaker implantation.  The patient has had syncope and acute decompensation for which he required emergent temporary pacing wire placement.  No reversible causes have been identified.  The patient therefore presents today for pacemaker implantation.     DESCRIPTION OF PROCEDURE:  Informed written consent was obtained, and  the patient was brought to the electrophysiology lab in a fasting state.  The patient was sedated using general anesthesia as outlined in the anesthesia report.  His left chest was prepped and draped in the usual sterile fashion by the EP lab staff. The skin overlying the left deltopectoral region was infiltrated with lidocaine for local analgesia.  A 4-cm incision was made over the left deltopectoral region.  A left subcutaneous pacemaker pocket was fashioned using a combination of sharp and blunt dissection. Electrocautery was required to assure hemostasis.    RA/RV Lead Placement: The left axillary vein was therefore cannulated.   No contrast was required for this.   Through the left axillary vein, a [facility] model [phone] (serial number  [id number]) right atrial lead and a Medtronic model [phone] (serial number  [id number]) right ventricular lead were advanced with fluoroscopic visualization into the right atrial appendage and right ventricular apex positions respectively.  Initial atrial lead P- waves measured 3.4 mV with impedance of 595 ohms and a threshold  of 0.7 V at 0.5 msec.  Right ventricular lead R-waves measured 17 mV (paced) with an impedance of 836 ohms and a threshold of 1.2 V at 0.5 msec.  Both leads were secured to the pectoralis fascia using #2-0 silk over the suture sleeves.   LV Lead Placement:  A Medtronic MB-2 guide was advanced through the left axillary vein into the low lateral right atrium. A Bard curved Josephson catheter was introduced through the MB-2 guide and used to cannulate the coronary sinus. Coronary sinus cannulation was confirmed with electrogram recording from the hexapolar catheter. A coronary sinus nonselective venogram was performed by hand injection of nonionic contrast. The CS did appear to perforate with selective balloon injection.    With careful manipulation a Whisper CSJ wire was introduced through the MB2 Guide and advanced into a distal lateral branch. [place] [phone] [id number] (serial number [id number]) lead was advanced through the MB-2 into the distal lateral branch. This was approximately one-third from the base to the apex in a very lateral  position. In this location with M2M3 bipolar configuration, the left ventricular lead R-waves measured 21 mV with impedance of 1010 ohms and a threshold of 0.9 volt at 0.5 Milliseconds with no diaphragmatic stimulation observed when pacing at 10 volts output. The MB-2 guide was therefore removed.  All three leads were secured to the pectoralis fascia using #2 silk suture over the suture sleeves. The pocket then irrigated with copious gentamicin solution.   Device Placement:  The leads were then connected to a [facility] RF model [id number] (serial number [id number]) pacemaker. The pocket  was irrigated with copious gentamicin solution. The pacemaker was then placed into the pocket. The pocket was then closed in 2 layers with 2.0 Vicryl suture for the subcutaneous and subcuticular layers. Steri- strips and a sterile dressing were then applied.   The temporary pacing wire was removed under fluoroscopic visualization without disrupting the newly placed leads. There were no early apparent complications.   CONCLUSIONS:  1. Successful implantation of a [employer]ure Quadra RF BiV pacemaker for symptomatic complete heart block, CHF, and EF 35-40% 2. No early apparent complications.   Thompson Grayer, MD  4:00 PM 08/08/2013

## 2013-08-08 NOTE — Interval H&P Note (Signed)
History and Physical Interval Note:  08/08/2013 11:32 AM  [name]  has presented today for surgery, with the diagnosis of Heart failure  The various methods of treatment have been discussed with the patient and family. After consideration of risks, benefits and other options for treatment, the patient has consented to  Procedure(s): BI-VENTRICULAR PACEMAKER INSERTION (CRT-P) (N/A) as a surgical intervention .  The patient's history has been reviewed, patient examined, no change in status, stable for surgery.  I have reviewed the patient's chart and labs.  Questions were answered to the patient's satisfaction.     Mark French

## 2013-08-09 ENCOUNTER — Inpatient Hospital Stay (HOSPITAL_COMMUNITY): Payer: Medicare Other

## 2013-08-09 DIAGNOSIS — I059 Rheumatic mitral valve disease, unspecified: Secondary | ICD-10-CM

## 2013-08-09 LAB — BASIC METABOLIC PANEL
BUN: 26 mg/dL — ABNORMAL HIGH (ref 6–23)
BUN: 26 mg/dL — AB (ref 6–23)
CO2: 29 mEq/L (ref 19–32)
CO2: 29 meq/L (ref 19–32)
Calcium: 8.6 mg/dL (ref 8.4–10.5)
Calcium: 8.6 mg/dL (ref 8.4–10.5)
Chloride: 100 mEq/L (ref 96–112)
Chloride: 98 mEq/L (ref 96–112)
Creatinine, Ser: 1.27 mg/dL (ref 0.50–1.35)
Creatinine, Ser: 1.32 mg/dL (ref 0.50–1.35)
GFR calc Af Amer: 65 mL/min — ABNORMAL LOW (ref >90)
GFR calc Af Amer: 62 mL/min — ABNORMAL LOW (ref >90)
GFR, EST NON AFRICAN AMERICAN: 56 mL/min — AB (ref >90)
GFR, EST NON AFRICAN AMERICAN: 53 mL/min — AB (ref >90)
GLUCOSE: 178 mg/dL — AB (ref 70–99)
Glucose, Bld: 159 mg/dL — ABNORMAL HIGH (ref 70–99)
POTASSIUM: 4.6 meq/L (ref 3.7–5.3)
Potassium: 4.7 mEq/L (ref 3.7–5.3)
SODIUM: 140 meq/L (ref 137–147)
SODIUM: 139 meq/L (ref 137–147)

## 2013-08-09 LAB — CBC WITH DIFFERENTIAL/PLATELET
Basophils Absolute: 0 10*3/uL (ref 0.0–0.1)
Basophils Relative: 0 % (ref 0–1)
Eosinophils Absolute: 0 10*3/uL (ref 0.0–0.7)
Eosinophils Relative: 0 % (ref 0–5)
HCT: 37.6 % — ABNORMAL LOW (ref 39.0–52.0)
HEMOGLOBIN: 11.9 g/dL — AB (ref 13.0–17.0)
LYMPHS ABS: 1.2 10*3/uL (ref 0.7–4.0)
LYMPHS PCT: 8 % — AB (ref 12–46)
MCH: 28.1 pg (ref 26.0–34.0)
MCHC: 31.6 g/dL (ref 30.0–36.0)
MCV: 88.7 fL (ref 78.0–100.0)
Monocytes Absolute: 0.8 10*3/uL (ref 0.1–1.0)
Monocytes Relative: 5 % (ref 3–12)
NEUTROS PCT: 87 % — AB (ref 43–77)
Neutro Abs: 13.3 10*3/uL — ABNORMAL HIGH (ref 1.7–7.7)
Platelets: 210 10*3/uL (ref 150–400)
RBC: 4.24 MIL/uL (ref 4.22–5.81)
RDW: 14.8 % (ref 11.5–15.5)
WBC: 15.3 10*3/uL — AB (ref 4.0–10.5)

## 2013-08-09 LAB — GLUCOSE, CAPILLARY
GLUCOSE-CAPILLARY: 131 mg/dL — AB (ref 70–99)
GLUCOSE-CAPILLARY: 151 mg/dL — AB (ref 70–99)
Glucose-Capillary: 148 mg/dL — ABNORMAL HIGH (ref 70–99)
Glucose-Capillary: 139 mg/dL — ABNORMAL HIGH (ref 70–99)

## 2013-08-09 MED ORDER — PROMETHAZINE HCL 25 MG/ML IJ SOLN
12.5000 mg | Freq: Four times a day (QID) | INTRAMUSCULAR | Status: DC | PRN
  Administered 2013-08-09 (×2): 6.25 mg via INTRAVENOUS
  Filled 2013-08-09: qty 1

## 2013-08-09 MED ORDER — FUROSEMIDE 10 MG/ML IJ SOLN
60.0000 mg | Freq: Two times a day (BID) | INTRAMUSCULAR | Status: DC
  Administered 2013-08-09 (×2): 60 mg via INTRAVENOUS
  Filled 2013-08-09 (×2): qty 6

## 2013-08-09 NOTE — Progress Notes (Addendum)
Patient ID: Mark French, male   DOB: 06-Dec-1945, 68 y.o.   MRN: [id number]   SUBJECTIVE: No chest pain.  Mild dyspnea.  Has pain in lower legs from neuropathy.   [name] antiseptic oral rinse  15 mL Mouth Rinse BID  . aspirin EC  81 mg Oral Daily  . atorvastatin  40 mg Oral q1800  . carvedilol  6.25 mg Oral BID WC  . clopidogrel  75 mg Oral Q breakfast  . furosemide  60 mg Intravenous BID  . gabapentin  600 mg Oral TID  . insulin aspart  0-15 Units Subcutaneous TID WC  . isosorbide mononitrate  30 mg Oral Daily  . losartan  50 mg Oral Daily  . pantoprazole  40 mg Oral Daily  . sodium chloride  3 mL Intravenous Q12H  . spironolactone  25 mg Oral Daily     Filed Vitals:   08/09/13 0300 08/09/13 0400 08/09/13 0500 08/09/13 0600  BP: 118/48 132/48 127/43 121/44  Pulse: 69 77 74 67  Temp:  98 F (36.7 C)    TempSrc:  Oral    Resp: 16 18 19 18   Height:      Weight:   104.3 kg (229 lb 15 oz)   SpO2: 94% 92% 97% 100%    Intake/Output Summary (Last 24 hours) at 08/09/13 0741 Last data filed at 08/09/13 0600  Gross per 24 hour  Intake   1313 ml  Output   1950 ml  Net   -637 ml    LABS: Basic Metabolic Panel:  Recent Labs  08/07/13 1745 08/08/13 0500 08/09/13 0435  NA 139 137 140  K 4.5 5.1 4.6  CL 98 98 100  CO2 30 29 29   GLUCOSE 147* 221* 178*  BUN 22 21 26*  CREATININE 1.59* 1.40* 1.27  CALCIUM 8.7 8.8 8.6  MG 2.7* 2.5  --    Liver Function Tests:  Recent Labs  08/07/13 1745 08/08/13 0500  AST 17  --   ALT 16  --   ALKPHOS 97  --   BILITOT 0.3  --   PROT 7.1  --   ALBUMIN 3.3* 3.2*   No results found for this basename: LIPASE, AMYLASE,  in the last 72 hours CBC:  Recent Labs  08/08/13 0500 08/09/13 0435  WBC 7.9 15.3*  NEUTROABS 7.3 13.3*  HGB 12.7* 11.9*  HCT 39.2 37.6*  MCV 87.7 88.7  PLT 200 210   Cardiac Enzymes:  Recent Labs  08/07/13 0054 08/07/13 1745 08/08/13 0717  TROPONINI <0.30 <0.30 <0.30   BNP: No components found with  this basename: POCBNP,  D-Dimer: No results found for this basename: DDIMER,  in the last 72 hours Hemoglobin A1C: No results found for this basename: HGBA1C,  in the last 72 hours Fasting Lipid Panel: No results found for this basename: CHOL, HDL, LDLCALC, TRIG, CHOLHDL, LDLDIRECT,  in the last 72 hours Thyroid Function Tests: No results found for this basename: TSH, T4TOTAL, FREET3, T3FREE, THYROIDAB,  in the last 72 hours Anemia Panel: No results found for this basename: VITAMINB12, FOLATE, FERRITIN, TIBC, IRON, RETICCTPCT,  in the last 72 hours  RADIOLOGY: Dg Chest 2 View  08/09/2013   CLINICAL DATA:  Status post device implant  EXAM: CHEST  2 VIEW  COMPARISON:  DG CHEST 1V PORT dated 08/08/2013  FINDINGS: Left-sided pacemaker placed with 3 continuous leads overlies normal cardiac silhouette. No evidence of pneumothorax following placement. Stable cardiac silhouette. Mild central venous pulmonary congestion.  Increased cervical fusion noted  IMPRESSION: 1. Interval placement of left-sided pacemaker with no complicating features. 2. Mild central venous pulmonary congestion.   Electronically Signed   By: Suzy Bouchard M.D.   On: 08/09/2013 07:23   PHYSICAL EXAM General: NAD Neck: JVP 10 cm, no thyromegaly or thyroid nodule.  Lungs: Decreased breath sounds at bases CV: Nondisplaced PMI.  Heart regular S1/S2, no S3/S4, no murmur.  1+ chronic bilateral ankle edema.  Abdomen: Soft, nontender, no hepatosplenomegaly, no distention.  Neurologic: Alert and oriented x 3.  Psych: Normal affect. Extremities: No clubbing or cyanosis.   TELEMETRY: Reviewed telemetry pt in NSR  ASSESSMENT AND PLAN: 68 yo with history of CAD s/p CABG, ischemic cardiomyopathy (EF 35-40%), and chronically disabled using motorized scooter presented with dyspnea/chest pain and was found to have complete heart block, now s/p CRT-D implantation.  1. CHB: s/p CRT-D.  He is now BiV-pacing with HR 80.  PCM has been checked  this morning, CXR ok.  2. CHF: Acute on chronic systolic CHF.  Patient does have some volume overload on exam.  Creatinine improved this morning.  I will increase Lasix to 60 mg IV bid today.  3. AKI: Creatinine lower this morning.  Follow with diuresis.  4. CAD: LHC this admission showed stable coronary disease with 2 patent grafts.  Continue ASA 81 and statin.  5. Leukocytosis: May be due to stress of illness, PCM placement.  No fever.  Follow. 6. Baseline limited mobility.  Will get PT consult to mobilize. 7. Transfer to telemetry, hopefully home maybe Monday.   Loralie Champagne 08/09/2013 7:46 AM

## 2013-08-09 NOTE — Progress Notes (Signed)
Patient has had two episodes of vomiting this am. Zofran 4mg  given earlier in shift with no relief. With second episode, Dr. Aundra Dubin notified and new order for phenergan 12.5mg  received. Patient notified of new order and refused medication stating "I dont want to be knocked out". RN verbalized low dosage of medication and side effects of medication may be more tolerable than nausea and vomiting. Patient agreed that if he has another episode of vomiting, he would request phenergan. Will monitor patient closely and notify MD as needed

## 2013-08-09 NOTE — Progress Notes (Signed)
Pts BP 80/42 in rt arm manually while resting in the chair. Pt refusing to go back to bed. Pt denies symptoms but is sleepy. Dr Radford Pax called and informed of the above and meds pt received earlier. Orders received and placed. Will cont to monitor.

## 2013-08-09 NOTE — Progress Notes (Signed)
  Echocardiogram 2D Echocardiogram has been performed.  Mark French 08/09/2013, 11:15 PM

## 2013-08-09 NOTE — Evaluation (Signed)
Physical Therapy Evaluation Patient Details Name: Mark French MRN: [id number] DOB: 09-Nov-1945 Today's Date: 08/09/2013 Time: 4196-2229 PT Time Calculation (min): 24 min  PT Assessment / Plan / Recommendation History of Present Illness  Patient admitted for placement of external pacemaker.  Patient with h/o CABG (1998), COPD, CHF, CKD, legally blind, DM, and falls  Clinical Impression  Patient did well with mobility today, but not back to baseline mobility.  Will benefit from PT to progress mobility for return to baseline level and return to home with wife.  Patient has necessary equipment at home and anticipate steady progress.      PT Assessment  Patient needs continued PT services    Follow Up Recommendations  Home health PT    Does the patient have the potential to tolerate intense rehabilitation      Barriers to Discharge Decreased caregiver support wife works afternoons    Equipment Recommendations  None recommended by PT    Recommendations for Other Services     Frequency Min 3X/week    Precautions / Restrictions Precautions Precautions: Fall Required Braces or Orthoses: Sling (left UE)   Pertinent Vitals/Pain Denies pain      Mobility  Bed Mobility Bed Mobility: Supine to Sit Supine to Sit: 3: Mod assist Details for Bed Mobility Assistance: required assistance to lift shoulders off bed due to left arm immobilized. Transfers Transfers: Sit to Stand;Stand to [employer] Transfers Sit to Stand: 3: Mod assist;With upper extremity assist Stand to Sit: 4: Min assist;With upper extremity assist;To chair/3-in-1 Stand Pivot Transfers: 3: Mod assist;With armrests Details for Transfer Assistance: assistance required for balance and lift hips off bed; required assistance secondary to decreased vision    Exercises     PT Diagnosis: Generalized weakness  PT Problem List: Decreased activity tolerance;Decreased mobility PT Treatment Interventions: Gait  training;Functional mobility training;Therapeutic activities;Therapeutic exercise     PT Goals(Current goals can be found in the care plan section) Acute Rehab PT Goals Patient Stated Goal: go home PT Goal Formulation: With patient/family Time For Goal Achievement: 08/16/13 Potential to Achieve Goals: Good  Visit Information  Last PT Received On: 08/09/13 Assistance Needed: +1 History of Present Illness: Patient admitted for placement of external pacemaker.  Patient with h/o CABG (1998), COPD, CHF, CKD, legally blind, DM, and falls       Prior [place] expects to be discharged to:: Private residence Living Arrangements: Spouse/significant other Available Help at Discharge: Family;Available PRN/intermittently Type of Home: House Home Access: Ramped entrance Home Layout: One level Home Equipment: Walker - 2 wheels;Cane - quad;Cane - single point;Grab bars - toilet;Grab bars - tub/shower;Hand held shower head;Wheelchair - power Prior Function Level of Independence: Independent with assistive device(s) Communication Communication: No difficulties Dominant Hand: Right    Cognition  Cognition Arousal/Alertness: Awake/alert Behavior During Therapy: WFL for tasks assessed/performed Overall Cognitive Status: Within Functional Limits for tasks assessed    Extremity/Trunk Assessment Upper Extremity Assessment Upper Extremity Assessment: LUE deficits/detail;Generalized weakness LUE: Unable to fully assess due to immobilization Lower Extremity Assessment Lower Extremity Assessment: Generalized weakness   Balance Balance Balance Assessed: Yes Static Sitting Balance Static Sitting - Balance Support: Bilateral upper extremity supported Static Sitting - Level of Assistance: 5: Stand by assistance  End of Session PT - End of Session Equipment Utilized During Treatment: Gait belt Activity Tolerance: Patient tolerated treatment well Patient left: in  chair;with nursing/sitter in room;with family/visitor present;with call bell/phone within reach Nurse Communication: Mobility status  GP  Shanna Cisco, [place] 08/09/2013, 4:22 PM

## 2013-08-10 DIAGNOSIS — N183 Chronic kidney disease, stage 3 unspecified: Secondary | ICD-10-CM

## 2013-08-10 LAB — GLUCOSE, CAPILLARY
GLUCOSE-CAPILLARY: 107 mg/dL — AB (ref 70–99)
GLUCOSE-CAPILLARY: 186 mg/dL — AB (ref 70–99)
GLUCOSE-CAPILLARY: 159 mg/dL — AB (ref 70–99)
Glucose-Capillary: 153 mg/dL — ABNORMAL HIGH (ref 70–99)

## 2013-08-10 LAB — BASIC METABOLIC PANEL
BUN: 35 mg/dL — ABNORMAL HIGH (ref 6–23)
CHLORIDE: 96 meq/L (ref 96–112)
CO2: 30 meq/L (ref 19–32)
Calcium: 8.6 mg/dL (ref 8.4–10.5)
Creatinine, Ser: 1.51 mg/dL — ABNORMAL HIGH (ref 0.50–1.35)
GFR calc Af Amer: 53 mL/min — ABNORMAL LOW (ref >90)
GFR calc non Af Amer: 45 mL/min — ABNORMAL LOW (ref >90)
GLUCOSE: 110 mg/dL — AB (ref 70–99)
Potassium: 3.9 mEq/L (ref 3.7–5.3)
SODIUM: 137 meq/L (ref 137–147)

## 2013-08-10 LAB — CBC WITH DIFFERENTIAL/PLATELET
BASOS ABS: 0 10*3/uL (ref 0.0–0.1)
Basophils Relative: 0 % (ref 0–1)
Eosinophils Absolute: 0.1 10*3/uL (ref 0.0–0.7)
Eosinophils Relative: 1 % (ref 0–5)
HEMATOCRIT: 38.1 % — AB (ref 39.0–52.0)
Hemoglobin: 12 g/dL — ABNORMAL LOW (ref 13.0–17.0)
LYMPHS ABS: 1.6 10*3/uL (ref 0.7–4.0)
LYMPHS PCT: 19 % (ref 12–46)
MCH: 27.9 pg (ref 26.0–34.0)
MCHC: 31.5 g/dL (ref 30.0–36.0)
MCV: 88.6 fL (ref 78.0–100.0)
MONO ABS: 1.5 10*3/uL — AB (ref 0.1–1.0)
Monocytes Relative: 19 % — ABNORMAL HIGH (ref 3–12)
NEUTROS ABS: 5 10*3/uL (ref 1.7–7.7)
Neutrophils Relative %: 61 % (ref 43–77)
Platelets: 168 10*3/uL (ref 150–400)
RBC: 4.3 MIL/uL (ref 4.22–5.81)
RDW: 15 % (ref 11.5–15.5)
WBC: 8.2 10*3/uL (ref 4.0–10.5)

## 2013-08-10 MED ORDER — FUROSEMIDE 10 MG/ML IJ SOLN
60.0000 mg | Freq: Every day | INTRAMUSCULAR | Status: DC
  Administered 2013-08-11: 60 mg via INTRAVENOUS
  Filled 2013-08-10 (×2): qty 6

## 2013-08-10 NOTE — Progress Notes (Signed)
Pt has not voided since foley d/ced about 12:00pm today. Pt received IV lasix after 1800. Had pt stand to try and void without success. Pt bladder scanned for about 350cc of urine. Dr Radford Pax notified and orders received. Will I and O cath and continue to monitor.

## 2013-08-10 NOTE — Progress Notes (Signed)
At 0600 pt unable to void. Bladder scanned for 253 CC. Dr Radford Pax made aware and she ordered to replace foley catheter. Foley placed without difficulty with yellow urine return.

## 2013-08-10 NOTE — Progress Notes (Signed)
Rebladder scanned pt per Dr. Landis Gandy order with only <100cc noted. Pt denies need to void will cont to monitor.

## 2013-08-10 NOTE — Progress Notes (Signed)
Called by nurse due to patient having low BP.  Patient asymptomatic with a BP of 80/2mmHg.  He had just received carvedilol, Norco and Imdur and had just put out 500cc of urine after lasix.  2D echo was ordered due to recent PPM placement.  No pericardia effusion noted on echo.  BP now increased to 96/66mmHg and patient asymptomatic.  Low BP most likely due to med effect in setting of diuresing 500cc.  Will continue to monitor.

## 2013-08-10 NOTE — Progress Notes (Signed)
Primary cardiologist: Dr. Sherren Mocha  Subjective:    Up in chair. No chest pain or breathlessness. Weak in general.  Objective:   Temp:  [97.5 F (36.4 C)-98.4 F (36.9 C)] 98.4 F (36.9 C) (01/04 0519) Pulse Rate:  [72-85] 72 (01/04 0519) Resp:  [18-23] 18 (01/04 0519) BP: (79-121)/(28-50) 121/48 mmHg (01/04 0519) SpO2:  [95 %-98 %] 96 % (01/04 0519) Last BM Date: 08/07/13  Filed Weights   08/07/13 2228 08/08/13 0500 08/09/13 0500  Weight: 232 lb 9.4 oz (105.5 kg) 227 lb 11.8 oz (103.3 kg) 229 lb 15 oz (104.3 kg)    Intake/Output Summary (Last 24 hours) at 08/10/13 1009 Last data filed at 08/10/13 0600  Gross per 24 hour  Intake    720 ml  Output   2810 ml  Net  -2090 ml    Telemetry: Paced rhythm.  Exam:  General: No distress.  Lungs: Clear, nonlabored.  Cardiac: RRR, no gallop.  Thorax: Stable device pocket.  Extremities: No pitting.   Lab Results:  Basic Metabolic Panel:  Recent Labs Lab 08/07/13 1745 08/08/13 0500 08/09/13 0435 08/09/13 0815 08/10/13 0538  NA 139 137 140 139 137  K 4.5 5.1 4.6 4.7 3.9  CL 98 98 100 98 96  CO2 30 29 29 29 30   GLUCOSE 147* 221* 178* 159* 110*  BUN 22 21 26* 26* 35*  CREATININE 1.59* 1.40* 1.27 1.32 1.51*  CALCIUM 8.7 8.8 8.6 8.6 8.6  MG 2.7* 2.5  --   --   --     Liver Function Tests:  Recent Labs Lab 08/07/13 1745 08/08/13 0500  AST 17  --   ALT 16  --   ALKPHOS 97  --   BILITOT 0.3  --   PROT 7.1  --   ALBUMIN 3.3* 3.2*    CBC:  Recent Labs Lab 08/08/13 0500 08/09/13 0435 08/10/13 0538  WBC 7.9 15.3* 8.2  HGB 12.7* 11.9* 12.0*  HCT 39.2 37.6* 38.1*  MCV 87.7 88.7 88.6  PLT 200 210 168    Echocardiogram: Study Conclusions  - Left ventricle: The cavity size was normal. The estimated ejection fraction was 40%. There is severe hypokinesis of the entireinferior myocardium. There was an increased relative contribution of atrial contraction to ventricular filling. Doppler  parameters are consistent with abnormal left ventricular relaxation (grade 1 diastolic dysfunction). - Ventricular septum: Septal motion showed paradox. - Mitral valve: Mild regurgitation. - Left atrium: The atrium was mildly dilated. - Atrial septum: No defect or patent foramen ovale was identified. - Pulmonary arteries: PA peak pressure: 5mm Hg (S). Impressions:  - The right ventricular systolic pressure was increased consistent with moderate pulmonary hypertension.    Medications:   Scheduled Medications: . antiseptic oral rinse  15 mL Mouth Rinse BID  . aspirin EC  81 mg Oral Daily  . atorvastatin  40 mg Oral q1800  . carvedilol  6.25 mg Oral BID WC  . clopidogrel  75 mg Oral Q breakfast  . furosemide  60 mg Intravenous BID  . gabapentin  600 mg Oral TID  . insulin aspart  0-15 Units Subcutaneous TID WC  . isosorbide mononitrate  30 mg Oral Daily  . losartan  50 mg Oral Daily  . pantoprazole  40 mg Oral Daily  . sodium chloride  3 mL Intravenous Q12H  . spironolactone  25 mg Oral Daily      PRN Medications:  sodium chloride, acetaminophen, albuterol, ALPRAZolam, budesonide, HYDROcodone-acetaminophen, ipratropium,  nitroGLYCERIN, ondansetron (ZOFRAN) IV, promethazine, sodium chloride   Assessment:   1. Complete heart block s/p CRT-P with St. Jude device per Dr. Rayann Heman.  2. Acute on chronic systolic heart failure. LVEF 40% by echocardiogram.  3. Acute on CKD, creatinine 1.5. Has had some difficulty voiding, Foley back in place.  4. Transient, asymptomatic hypotension - now resolved. Diuretics were increased yesterday.   Plan/Discussion:    Cut back IV Lasix. Continue Coreg, Losartan, Lipitor, ASA, Plavix, Imdur, Aldactone. Needs to work with PT. States he has difficultly urinating without standing, so hopefully can get up more and then Foley can be removed more easily. Followup BMET AM.   Satira Sark, M.D., F.A.C.C.

## 2013-08-11 DIAGNOSIS — N179 Acute kidney failure, unspecified: Secondary | ICD-10-CM

## 2013-08-11 LAB — CBC WITH DIFFERENTIAL/PLATELET
BASOS ABS: 0 10*3/uL (ref 0.0–0.1)
Basophils Relative: 0 % (ref 0–1)
Eosinophils Absolute: 0.2 10*3/uL (ref 0.0–0.7)
Eosinophils Relative: 3 % (ref 0–5)
HCT: 33.7 % — ABNORMAL LOW (ref 39.0–52.0)
Hemoglobin: 10.8 g/dL — ABNORMAL LOW (ref 13.0–17.0)
LYMPHS PCT: 20 % (ref 12–46)
Lymphs Abs: 1.4 10*3/uL (ref 0.7–4.0)
MCH: 28 pg (ref 26.0–34.0)
MCHC: 32 g/dL (ref 30.0–36.0)
MCV: 87.3 fL (ref 78.0–100.0)
Monocytes Absolute: 1.2 10*3/uL — ABNORMAL HIGH (ref 0.1–1.0)
Monocytes Relative: 16 % — ABNORMAL HIGH (ref 3–12)
Neutro Abs: 4.4 10*3/uL (ref 1.7–7.7)
Neutrophils Relative %: 61 % (ref 43–77)
PLATELETS: 158 10*3/uL (ref 150–400)
RBC: 3.86 MIL/uL — ABNORMAL LOW (ref 4.22–5.81)
RDW: 14.5 % (ref 11.5–15.5)
WBC: 7.3 10*3/uL (ref 4.0–10.5)

## 2013-08-11 LAB — BASIC METABOLIC PANEL
BUN: 33 mg/dL — ABNORMAL HIGH (ref 6–23)
CHLORIDE: 95 meq/L — AB (ref 96–112)
CO2: 30 meq/L (ref 19–32)
Calcium: 8.1 mg/dL — ABNORMAL LOW (ref 8.4–10.5)
Creatinine, Ser: 1.27 mg/dL (ref 0.50–1.35)
GFR calc Af Amer: 65 mL/min — ABNORMAL LOW (ref >90)
GFR calc non Af Amer: 56 mL/min — ABNORMAL LOW (ref >90)
Glucose, Bld: 174 mg/dL — ABNORMAL HIGH (ref 70–99)
Potassium: 3.9 mEq/L (ref 3.7–5.3)
SODIUM: 136 meq/L — AB (ref 137–147)

## 2013-08-11 LAB — GLUCOSE, CAPILLARY
GLUCOSE-CAPILLARY: 139 mg/dL — AB (ref 70–99)
Glucose-Capillary: 162 mg/dL — ABNORMAL HIGH (ref 70–99)
Glucose-Capillary: 175 mg/dL — ABNORMAL HIGH (ref 70–99)

## 2013-08-11 NOTE — Discharge Instructions (Signed)
° °  Supplemental Discharge Instructions for  Pacemaker/Defibrillator Patients  Activity No heavy lifting or vigorous activity with your left/right arm for 6 to 8 weeks.  Do not raise your left/right arm above your head for one week.  Gradually raise your affected arm as drawn below.           01/05                      01/06                       01/07                      01/08       WOUND CARE   Keep the wound area clean and dry.  Do not get this area wet for one week. No showers for one week; you may shower on 08/18/2012.   The tape/steri-strips on your wound will fall off; do not pull them off.  No bandage is needed on the site.  DO  NOT apply any creams, oils, or ointments to the wound area.   If you notice any drainage or discharge from the wound, any swelling or bruising at the site, or you develop a fever > 101? F after you are discharged home, call the office at once.  Special Instructions   You are still able to use cellular telephones; use the ear opposite the side where you have your pacemaker/defibrillator.  Avoid carrying your cellular phone near your device.   When traveling through airports, show security personnel your identification card to avoid being screened in the metal detectors.  Ask the security personnel to use the hand wand.   Avoid arc welding equipment, MRI testing (magnetic resonance imaging), TENS units (transcutaneous nerve stimulators).  Call the office for questions about other devices.   Avoid electrical appliances that are in poor condition or are not properly grounded.   Microwave ovens are safe to be near or to operate.   Home Health  Services arranged with [facility]. [phone]. Registered Nurse and Physical Therapy

## 2013-08-11 NOTE — Consult Note (Addendum)
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  Wife states in the past he has worn Una boots, but wound appearance is improved at this time and he does not need them.  Consult requested Friday but pt was in the cath lab at that time.  Discussed plan of care and assessment with bedside nurse at that time. Wife is very well-informed regarding wound assessment and plan of care. Left leg with few patchy areas of partial thickness skin loss and small dry scabs. Measurement: Right calf 2.5X2X.2cm Wound bed:80% red, 20% yellow Drainage (amount, consistency, odor) No odor, small amt yellow drainage. Periwound: Intact skin surrounding.   Dressing procedure/placement/frequency: Foam dressing to protect and promote healing. Please re-consult if further assistance is needed.  Thank-you,  Julien Girt MSN, [place], [place], [place], [place]

## 2013-08-11 NOTE — Progress Notes (Signed)
Patient Name: Mark French Date of Encounter: 08/11/2013     Active Problems:   Complete heart block   Mobitz type 2 second degree heart block   Acute coronary syndrome    SUBJECTIVE  The patient denies any chest pain or shortness of breath.  He still has his Foley catheter in place.  CURRENT MEDS . antiseptic oral rinse  15 mL Mouth Rinse BID  . aspirin EC  81 mg Oral Daily  . atorvastatin  40 mg Oral q1800  . carvedilol  6.25 mg Oral BID WC  . clopidogrel  75 mg Oral Q breakfast  . furosemide  60 mg Intravenous Daily  . gabapentin  600 mg Oral TID  . insulin aspart  0-15 Units Subcutaneous TID WC  . isosorbide mononitrate  30 mg Oral Daily  . losartan  50 mg Oral Daily  . pantoprazole  40 mg Oral Daily  . sodium chloride  3 mL Intravenous Q12H  . spironolactone  25 mg Oral Daily    OBJECTIVE  Filed Vitals:   08/09/13 2307 08/10/13 0519 08/10/13 2100 08/11/13 0500  BP: 96/50 121/48 138/52 115/37  Pulse: 72 72 75 71  Temp:  98.4 F (36.9 C) 97.9 F (36.6 C) 98.5 F (36.9 C)  TempSrc:  Oral    Resp:  18 18 18   Height:      Weight:    227 lb 6.4 oz (103.148 kg)  SpO2: 96% 96% 99% 96%    Intake/Output Summary (Last 24 hours) at 08/11/13 0839 Last data filed at 08/11/13 0600  Gross per 24 hour  Intake    480 ml  Output   2300 ml  Net  -1820 ml   Filed Weights   08/08/13 0500 08/09/13 0500 08/11/13 0500  Weight: 227 lb 11.8 oz (103.3 kg) 229 lb 15 oz (104.3 kg) 227 lb 6.4 oz (103.148 kg)    PHYSICAL EXAM  General: Pleasant, NAD. Neuro: Alert and oriented X 3.  Has right-sided weakness secondary to prior stroke Psych: Normal affect. HEENT:  Normal  Neck: Supple without bruits or JVD. Lungs:  Resp regular and unlabored, CTA. Heart: RRR no s3, s4, or murmurs.  Pacemaker site shows minimal ecchymosis and no evidence of infection. Abdomen: Soft, non-tender, non-distended, BS + x 4.  Foley catheter in place Extremities: Chronic venous stasis  changes.  Accessory Clinical Findings  CBC  Recent Labs  08/10/13 0538 08/11/13 0500  WBC 8.2 7.3  NEUTROABS 5.0 4.4  HGB 12.0* 10.8*  HCT 38.1* 33.7*  MCV 88.6 87.3  PLT 168 846   Basic Metabolic Panel  Recent Labs  08/10/13 0538 08/11/13 0500  NA 137 136*  K 3.9 3.9  CL 96 95*  CO2 30 30  GLUCOSE 110* 174*  BUN 35* 33*  CREATININE 1.51* 1.27  CALCIUM 8.6 8.1*   Liver Function Tests No results found for this basename: AST, ALT, ALKPHOS, BILITOT, PROT, ALBUMIN,  in the last 72 hours No results found for this basename: LIPASE, AMYLASE,  in the last 72 hours Cardiac Enzymes No results found for this basename: CKTOTAL, CKMB, CKMBINDEX, TROPONINI,  in the last 72 hours BNP No components found with this basename: POCBNP,  D-Dimer No results found for this basename: DDIMER,  in the last 72 hours Hemoglobin A1C No results found for this basename: HGBA1C,  in the last 72 hours Fasting Lipid Panel No results found for this basename: CHOL, HDL, LDLCALC, TRIG, CHOLHDL, LDLDIRECT,  in the  last 72 hours Thyroid Function Tests No results found for this basename: TSH, T4TOTAL, FREET3, T3FREE, THYROIDAB,  in the last 72 hours  TELE  Atrial sense ventricular paced rhythm  ECG    Radiology/Studies  Dg Chest 2 View  08/09/2013   CLINICAL DATA:  Status post device implant  EXAM: CHEST  2 VIEW  COMPARISON:  DG CHEST 1V PORT dated 08/08/2013  FINDINGS: Left-sided pacemaker placed with 3 continuous leads overlies normal cardiac silhouette. No evidence of pneumothorax following placement. Stable cardiac silhouette. Mild central venous pulmonary congestion. Increased cervical fusion noted  IMPRESSION: 1. Interval placement of left-sided pacemaker with no complicating features. 2. Mild central venous pulmonary congestion.   Electronically Signed   By: Suzy Bouchard M.D.   On: 08/09/2013 07:23   Ct Head Wo Contrast  07/15/2013   CLINICAL DATA:  Fall, syncope  EXAM: CT HEAD WITHOUT  CONTRAST  TECHNIQUE: Contiguous axial images were obtained from the base of the skull through the vertex without intravenous contrast.  COMPARISON:  03/26/2013  FINDINGS: No skull fracture is noted. No intracranial hemorrhage, mass effect or midline shift. Stable periventricular chronic white matter disease. 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 No acute cortical infarction. No mass lesion is noted on this unenhanced scan.  Paranasal sinuses and mastoid air cells are unremarkable.  IMPRESSION: No acute intracranial abnormality. 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   Electronically Signed   By: Lahoma Crocker M.D.   On: 07/15/2013 17:43   Dg Chest Port 1 View  08/08/2013   CLINICAL DATA:  CHF.  EXAM: PORTABLE CHEST - 1 VIEW  COMPARISON:  08/07/2013.  FINDINGS: Prior CABG. Cardiomegaly. Normal pulmonary vascularity. Pacing leads are noted over the chest. Atelectatic changes left lung base. No pleural effusion or pneumothorax.  IMPRESSION: 1. Prior CABG.  Cardiomegaly.  No pulmonary venous congestion . 2. Mild atelectatic changes left lung base.   Electronically Signed   By: Marcello Moores  Register   On: 08/08/2013 07:05   Dg Chest Port 1 View  08/07/2013   CLINICAL DATA:  Headache cardia. History of coronary artery disease.  EXAM: PORTABLE CHEST - 1 VIEW  COMPARISON:  Chest x-ray 06/15/2013.  FINDINGS: Lung volumes are normal. Film is slightly under penetrated. With this limitation in mind, there is no definite acute consolidative airspace disease, and no definite pleural effusions. Pulmonary vasculature is normal. Heart size appears mildly enlarged, although this may be accentuated by portable technique, and patient rotation to the left, which also distorts upper mediastinal contours. Atherosclerosis in the thoracic aorta. Status post median sternotomy for CABG, including LIMA. External defibrillator pads are seen projecting over the mid to lower thorax bilaterally. Orthopedic fixation hardware in the lower cervical  spine incidentally noted.  IMPRESSION: 1. No definite radiographic evidence of acute cardiopulmonary disease. 2. Heart size appears mildly enlarged. 3. Atherosclerosis. 4. Postoperative changes and support apparatus, as above.   Electronically Signed   By: Vinnie Langton M.D.   On: 08/07/2013 17:58   Dg Shoulder Left  07/16/2013   CLINICAL DATA:  Left shoulder pain status post fall. The patient could not tolerate the axial view  EXAM: LEFT SHOULDER - 2+ VIEW  COMPARISON:  None.  FINDINGS: The proximal humerus appears intact. As best as can be determined the bony glenoid and acromion and clavicle are intact. There are no findings to suggest dislocation. The observed portions of the upper left ribs appear intact.  IMPRESSION: No acute fracture or dislocation is demonstrated on this  limited two view series. If there remain strong clinical concerns of an occult fracture or dislocation, CT scanning would be a useful next step.   Electronically Signed   By: David  Martinique   On: 07/16/2013 15:26    ASSESSMENT AND PLAN 1. Complete heart block s/p CRT-P with St. Jude device per Dr. Rayann Heman.  2. Acute on chronic systolic heart failure. LVEF 40% by echocardiogram.  3. Acute on CKD,  4. Transient, asymptomatic hypotension, resolved.  Plan: Continue to work with physical therapy.  We will allow him to stand to void.  Discontinue Foley catheter.   Signed, Darlin Coco MD

## 2013-08-11 NOTE — Progress Notes (Signed)
Physical Therapy Treatment Patient Details Name: Mark French MRN: [id number] DOB: 06-23-1946 Today's Date: 08/11/2013 Time: 1133-1200 PT Time Calculation (min): 27 min  PT Assessment / Plan / Recommendation  History of Present Illness Patient admitted for placement of external pacemaker.  Patient with h/o CABG (1998), COPD, CHF, CKD, legally blind, DM, and falls   PT Comments   Patient very motivated and agreeable to work with therapy. Patient able to work on SPT. Difficulty coming into standing position from recliner without Mod A and support. Patient does have lift recliner at home that he is in most of the time. He also sleeps in the recliner as well. Patient unable to use cane due to L UE precautions and previous stroke affecting use of R hand.   Follow Up Recommendations  Home health PT     Does the patient have the potential to tolerate intense rehabilitation     Barriers to Discharge        Equipment Recommendations  None recommended by PT    Recommendations for Other Services    Frequency Min 3X/week   Progress towards PT Goals Progress towards PT goals: Progressing toward goals  Plan Current plan remains appropriate    Precautions / Restrictions Precautions Precautions: Fall Required Braces or Orthoses: Sling   Pertinent Vitals/Pain no apparent distress     Mobility  Bed Mobility Bed Mobility: Not assessed Details for Bed Mobility Assistance: Patient in recliner upon entering room Transfers Sit to Stand: 3: Mod assist;With upper extremity assist;From chair/3-in-1 Stand to Sit: 4: Min assist;With upper extremity assist;To chair/3-in-1 Stand Pivot Transfers: 4: Min assist Details for Transfer Assistance: assistance required for balance and lift hips off recliner using rocking technique for momentum. Patient able to take small shuffle steps with pivot. Pivoted from recliner <> chair with cueing and rest break between Ambulation/Gait Ambulation/Gait Assistance: Not  tested (comment)    Exercises     PT Diagnosis:    PT Problem List:   PT Treatment Interventions:     PT Goals (current goals can now be found in the care plan section)    Visit Information  Last PT Received On: 08/11/13 Assistance Needed: +1 History of Present Illness: Patient admitted for placement of external pacemaker.  Patient with h/o CABG (1998), COPD, CHF, CKD, legally blind, DM, and falls    Subjective Data      Cognition  Cognition Arousal/Alertness: Awake/alert Behavior During Therapy: WFL for tasks assessed/performed Overall Cognitive Status: Within Functional Limits for tasks assessed    Balance     End of Session PT - End of Session Equipment Utilized During Treatment: Gait belt Activity Tolerance: Patient tolerated treatment well Patient left: in chair;with call bell/phone within reach   GP     Ted Leonhart, Tonia Brooms 08/11/2013, 12:12 PM  08/11/2013 Jacqualyn Posey PTA [phone] pager [phone] office

## 2013-08-12 ENCOUNTER — Encounter (HOSPITAL_COMMUNITY): Payer: Self-pay | Admitting: Physician Assistant

## 2013-08-12 LAB — CBC WITH DIFFERENTIAL/PLATELET
BASOS ABS: 0 10*3/uL (ref 0.0–0.1)
Basophils Relative: 0 % (ref 0–1)
Eosinophils Absolute: 0.5 10*3/uL (ref 0.0–0.7)
Eosinophils Relative: 6 % — ABNORMAL HIGH (ref 0–5)
HCT: 38.3 % — ABNORMAL LOW (ref 39.0–52.0)
Hemoglobin: 12.3 g/dL — ABNORMAL LOW (ref 13.0–17.0)
LYMPHS PCT: 18 % (ref 12–46)
Lymphs Abs: 1.5 10*3/uL (ref 0.7–4.0)
MCH: 28.2 pg (ref 26.0–34.0)
MCHC: 32.1 g/dL (ref 30.0–36.0)
MCV: 87.8 fL (ref 78.0–100.0)
Monocytes Absolute: 1.1 10*3/uL — ABNORMAL HIGH (ref 0.1–1.0)
Monocytes Relative: 13 % — ABNORMAL HIGH (ref 3–12)
Neutro Abs: 5.1 10*3/uL (ref 1.7–7.7)
Neutrophils Relative %: 63 % (ref 43–77)
Platelets: 163 10*3/uL (ref 150–400)
RBC: 4.36 MIL/uL (ref 4.22–5.81)
RDW: 14.5 % (ref 11.5–15.5)
WBC: 8.2 10*3/uL (ref 4.0–10.5)

## 2013-08-12 LAB — BASIC METABOLIC PANEL
BUN: 28 mg/dL — ABNORMAL HIGH (ref 6–23)
CALCIUM: 8.4 mg/dL (ref 8.4–10.5)
CO2: 27 meq/L (ref 19–32)
Chloride: 94 mEq/L — ABNORMAL LOW (ref 96–112)
Creatinine, Ser: 1.19 mg/dL (ref 0.50–1.35)
GFR calc Af Amer: 70 mL/min — ABNORMAL LOW (ref >90)
GFR calc non Af Amer: 61 mL/min — ABNORMAL LOW (ref >90)
Glucose, Bld: 223 mg/dL — ABNORMAL HIGH (ref 70–99)
Potassium: 4 mEq/L (ref 3.7–5.3)
SODIUM: 134 meq/L — AB (ref 137–147)

## 2013-08-12 LAB — GLUCOSE, CAPILLARY
GLUCOSE-CAPILLARY: 217 mg/dL — AB (ref 70–99)
Glucose-Capillary: 201 mg/dL — ABNORMAL HIGH (ref 70–99)

## 2013-08-12 NOTE — Progress Notes (Signed)
Patient Name: Mark French Date of Encounter: 08/12/2013     Active Problems:   Complete heart block   Mobitz type 2 second degree heart block   Acute coronary syndrome    SUBJECTIVE The patient feels well today.  He has been able to walk to the bathroom.  The Foley catheter was removed successfully yesterday and the patient is voiding well.  Telemetry shows normal pacemaker function.  CURRENT MEDS . antiseptic oral rinse  15 mL Mouth Rinse BID  . aspirin EC  81 mg Oral Daily  . atorvastatin  40 mg Oral q1800  . carvedilol  6.25 mg Oral BID WC  . clopidogrel  75 mg Oral Q breakfast  . furosemide  60 mg Intravenous Daily  . gabapentin  600 mg Oral TID  . insulin aspart  0-15 Units Subcutaneous TID WC  . isosorbide mononitrate  30 mg Oral Daily  . losartan  50 mg Oral Daily  . pantoprazole  40 mg Oral Daily  . sodium chloride  3 mL Intravenous Q12H  . spironolactone  25 mg Oral Daily    OBJECTIVE  Filed Vitals:   08/10/13 2100 08/11/13 0500 08/11/13 2132 08/12/13 0410  BP: 138/52 115/37 124/72 130/72  Pulse: 75 71 63 66  Temp: 97.9 F (36.6 C) 98.5 F (36.9 C) 97.8 F (36.6 C) 98 F (36.7 C)  TempSrc:   Oral Oral  Resp: 18 18 18 18   Height:      Weight:  227 lb 6.4 oz (103.148 kg)    SpO2: 99% 96% 100% 99%    Intake/Output Summary (Last 24 hours) at 08/12/13 0953 Last data filed at 08/11/13 2224  Gross per 24 hour  Intake    360 ml  Output    204 ml  Net    156 ml   Filed Weights   08/08/13 0500 08/09/13 0500 08/11/13 0500  Weight: 227 lb 11.8 oz (103.3 kg) 229 lb 15 oz (104.3 kg) 227 lb 6.4 oz (103.148 kg)    PHYSICAL EXAM  General: Pleasant, NAD. Neuro: Alert and oriented X 3.  Right-sided weakness secondary to prior stroke. Psych: Normal affect. HEENT:  Normal  Neck: Supple without bruits or JVD. Lungs:  Resp regular and unlabored, CTA.  Pacemaker site is stable Heart: RRR no s3, s4, or murmurs. Abdomen: Soft, non-tender, non-distended, BS +  x 4.  Extremities: Chronic venous stasis changes.  Accessory Clinical Findings  CBC  Recent Labs  08/11/13 0500 08/12/13 0510  WBC 7.3 8.2  NEUTROABS 4.4 5.1  HGB 10.8* 12.3*  HCT 33.7* 38.3*  MCV 87.3 87.8  PLT 158 220   Basic Metabolic Panel  Recent Labs  08/11/13 0500 08/12/13 0510  NA 136* 134*  K 3.9 4.0  CL 95* 94*  CO2 30 27  GLUCOSE 174* 223*  BUN 33* 28*  CREATININE 1.27 1.19  CALCIUM 8.1* 8.4   Liver Function Tests No results found for this basename: AST, ALT, ALKPHOS, BILITOT, PROT, ALBUMIN,  in the last 72 hours No results found for this basename: LIPASE, AMYLASE,  in the last 72 hours Cardiac Enzymes No results found for this basename: CKTOTAL, CKMB, CKMBINDEX, TROPONINI,  in the last 72 hours BNP No components found with this basename: POCBNP,  D-Dimer No results found for this basename: DDIMER,  in the last 72 hours Hemoglobin A1C No results found for this basename: HGBA1C,  in the last 72 hours Fasting Lipid Panel No results found for this  basename: CHOL, HDL, LDLCALC, TRIG, CHOLHDL, LDLDIRECT,  in the last 72 hours Thyroid Function Tests No results found for this basename: TSH, T4TOTAL, FREET3, T3FREE, THYROIDAB,  in the last 72 hours  TELE  Telemetry shows AV sequential pacing  ECG    Radiology/Studies  Dg Chest 2 View  08/09/2013   CLINICAL DATA:  Status post device implant  EXAM: CHEST  2 VIEW  COMPARISON:  DG CHEST 1V PORT dated 08/08/2013  FINDINGS: Left-sided pacemaker placed with 3 continuous leads overlies normal cardiac silhouette. No evidence of pneumothorax following placement. Stable cardiac silhouette. Mild central venous pulmonary congestion. Increased cervical fusion noted  IMPRESSION: 1. Interval placement of left-sided pacemaker with no complicating features. 2. Mild central venous pulmonary congestion.   Electronically Signed   By: Suzy Bouchard M.D.   On: 08/09/2013 07:23   Ct Head Wo Contrast  07/15/2013   CLINICAL  DATA:  Fall, syncope  EXAM: CT HEAD WITHOUT CONTRAST  TECHNIQUE: Contiguous axial images were obtained from the base of the skull through the vertex without intravenous contrast.  COMPARISON:  03/26/2013  FINDINGS: No skull fracture is noted. No intracranial hemorrhage, mass effect or midline shift. Stable periventricular chronic white matter disease. 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 No acute cortical infarction. No mass lesion is noted on this unenhanced scan.  Paranasal sinuses and mastoid air cells are unremarkable.  IMPRESSION: No acute intracranial abnormality. 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   Electronically Signed   By: Lahoma Crocker M.D.   On: 07/15/2013 17:43   Dg Chest Port 1 View  08/08/2013   CLINICAL DATA:  CHF.  EXAM: PORTABLE CHEST - 1 VIEW  COMPARISON:  08/07/2013.  FINDINGS: Prior CABG. Cardiomegaly. Normal pulmonary vascularity. Pacing leads are noted over the chest. Atelectatic changes left lung base. No pleural effusion or pneumothorax.  IMPRESSION: 1. Prior CABG.  Cardiomegaly.  No pulmonary venous congestion . 2. Mild atelectatic changes left lung base.   Electronically Signed   By: Marcello Moores  Register   On: 08/08/2013 07:05   Dg Chest Port 1 View  08/07/2013   CLINICAL DATA:  Headache cardia. History of coronary artery disease.  EXAM: PORTABLE CHEST - 1 VIEW  COMPARISON:  Chest x-ray 06/15/2013.  FINDINGS: Lung volumes are normal. Film is slightly under penetrated. With this limitation in mind, there is no definite acute consolidative airspace disease, and no definite pleural effusions. Pulmonary vasculature is normal. Heart size appears mildly enlarged, although this may be accentuated by portable technique, and patient rotation to the left, which also distorts upper mediastinal contours. Atherosclerosis in the thoracic aorta. Status post median sternotomy for CABG, including LIMA. External defibrillator pads are seen projecting over the mid to lower thorax bilaterally. Orthopedic  fixation hardware in the lower cervical spine incidentally noted.  IMPRESSION: 1. No definite radiographic evidence of acute cardiopulmonary disease. 2. Heart size appears mildly enlarged. 3. Atherosclerosis. 4. Postoperative changes and support apparatus, as above.   Electronically Signed   By: Vinnie Langton M.D.   On: 08/07/2013 17:58   Dg Shoulder Left  07/16/2013   CLINICAL DATA:  Left shoulder pain status post fall. The patient could not tolerate the axial view  EXAM: LEFT SHOULDER - 2+ VIEW  COMPARISON:  None.  FINDINGS: The proximal humerus appears intact. As best as can be determined the bony glenoid and acromion and clavicle are intact. There are no findings to suggest dislocation. The observed portions of the upper left ribs appear intact.  IMPRESSION: No acute fracture or dislocation is demonstrated on this limited two view series. If there remain strong clinical concerns of an occult fracture or dislocation, CT scanning would be a useful next step.   Electronically Signed   By: David  Martinique   On: 07/16/2013 15:26    ASSESSMENT AND PLAN 1. Complete heart block s/p CRT-P with St. Jude device per Dr. Rayann Heman.  2. Acute on chronic systolic heart failure. LVEF 40% by echocardiogram.  3. Acute on CKD, renal function improved further since removal of Foley catheter. 4. Transient, asymptomatic hypotension, resolved.  Plan: Okay for discharge today.  We'll request home health RN physical therapy occupational therapy. Return to office later this week for pacemaker wound check.  Signed, Darlin Coco MD

## 2013-08-12 NOTE — Discharge Summary (Signed)
CARDIOLOGY DISCHARGE SUMMARY   Patient ID: Mark French MRN: [id number] DOB/AGE: 05-02-1946 68 y.o.  Admit date: 08/07/2013 Discharge date: 08/12/2013  PCP: [name], MD Primary Cardiologist: [facility]  Primary Discharge Diagnosis:   Complete heart block  -  [place] RF model [id number] (serial number [id number])  Secondary Discharge Diagnosis:    Mobitz type 2 second degree heart block   Acute coronary syndrome   Acute systolic CHF with pulmonary edema and acute respiratory failure   Deconditioning   Stage III chronic kidney disease  Consults: EP, CCM, wound care consult  Procedures: Left heart catheterization with native vessel and graft angiography, temporary transvenous pacemaker placement, Biventricular pacemaker implantation, 2-D echocardiogram   Hospital Course: ALIOUNE French is a 68 y.o. male with a history of CAD and syncope. He had been hospitalized for syncope in December 2014 but no cause was found. He developed shortness of breath and chest pain. They came to the emergency room and he was bradycardic and hypotensive with a heart rate of 30. He improved with atropine and normal saline. He was admitted for further evaluation and treatment.  He had recurrent complete heart block. He was started on IV dopamine but he had a wide complex tachycardia in the dopamine was discontinued. When he would go into the heart block become bradycardic, his chest pain would return. He was felt to need emergent intervention and was taken to the Cath Lab.  He had a temporary pacemaker inserted and a cardiac catheterization. The cardiac catheterization showed no clear culprit lesion and medical therapy is recommended for known coronary artery disease. His heart rate improved with temporary pacing and his chest pain resolved.  After the cath, he had become tachypneic with decreased level of consciousness and increased respiratory distress. His oxygen saturation was 83% on  nonrebreather. He was given IV Lasix, morphine and started on IV nitroglycerin. He was started on BiPAP. The nitroglycerin caused hypotension and was discontinued but his respiratory status improved with the Lasix and the BiPAP.  He has underlying stage III chronic kidney disease. With diuresis, his BUN and creatinine increased, peaking at 35/1.51. There was improvement once he was off IV diuretics and discharge labs are listed below. His creatinine is actually a little below baseline at this time.  A 2-D echocardiogram was performed which showed an EF of 40%. Results are below. He was evaluated by electrophysiology and the decision was made to insert a biventricular pacemaker once he was otherwise medically stable. He was seen by Dr. Talmadge Coventry with CCM. By January 2, his respiratory status had improved with BiPAP and diuresis so the BiPAP can be discontinued. He was on supplemental oxygen when necessary. He was considered stable for his permanent pacemaker and a St. Jude device was inserted on 08/08/2013.  He tolerated the procedure well. The post-procedure chest x-ray showed no complications.  He had a right leg wound that was 2 x 1 cm. A foam dressing was placed. With a foam dressing in place, the wound was healing well.  He has had some problems with deconditioning and restricted mobility because of the pacemaker. He was seen by case management and by physical therapy. A home health nurse as well as home health PT is to begin at discharge.  On 08/12/2013, he was seen by Dr. Mare Ferrari and all data were reviewed. Mr. Mark French was improving and no further inpatient workup was indicated. He is considered stable for discharge, to follow up  as an outpatient.  Labs:   Lab Results  Component Value Date   WBC 8.2 08/12/2013   HGB 12.3* 08/12/2013   HCT 38.3* 08/12/2013   MCV 87.8 08/12/2013   PLT 163 08/12/2013    Recent Labs Lab 08/07/13 1745  08/12/13 0510  NA 139  < > 134*  K 4.5  < > 4.0  CL 98   < > 94*  CO2 30  < > 27  BUN 22  < > 28*  CREATININE 1.59*  < > 1.19  CALCIUM 8.7  < > 8.4  PROT 7.1  --   --   BILITOT 0.3  --   --   ALKPHOS 97  --   --   ALT 16  --   --   AST 17  --   --   GLUCOSE 147*  < > 223*  < > = values in this interval not displayed. Lab Results  Component Value Date   TROPONINI <0.30 08/08/2013   Pro B Natriuretic peptide (BNP)  Date/Time Value Range Status  08/07/2013  5:45 PM 568.0* 0 - 125 pg/mL Final  06/15/2013  2:41 PM 361.5* 0 - 125 pg/mL Final    Radiology: Dg Chest 2 View 08/09/2013   CLINICAL DATA:  Status post device implant  EXAM: CHEST  2 VIEW  COMPARISON:  DG CHEST 1V PORT dated 08/08/2013  FINDINGS: Left-sided pacemaker placed with 3 continuous leads overlies normal cardiac silhouette. No evidence of pneumothorax following placement. Stable cardiac silhouette. Mild central venous pulmonary congestion. Increased cervical fusion noted  IMPRESSION: 1. Interval placement of left-sided pacemaker with no complicating features. 2. Mild central venous pulmonary congestion.   Electronically Signed   By: Suzy Bouchard M.D.   On: 08/09/2013 07:23   Dg Chest Port 1 View 08/08/2013   CLINICAL DATA:  CHF.  EXAM: PORTABLE CHEST - 1 VIEW  COMPARISON:  08/07/2013.  FINDINGS: Prior CABG. Cardiomegaly. Normal pulmonary vascularity. Pacing leads are noted over the chest. Atelectatic changes left lung base. No pleural effusion or pneumothorax.  IMPRESSION: 1. Prior CABG.  Cardiomegaly.  No pulmonary venous congestion . 2. Mild atelectatic changes left lung base.   Electronically Signed   By: Marcello Moores  Register   On: 08/08/2013 07:05   Dg Chest Port 1 View 08/07/2013   CLINICAL DATA:  Headache cardia. History of coronary artery disease.  EXAM: PORTABLE CHEST - 1 VIEW  COMPARISON:  Chest x-ray 06/15/2013.  FINDINGS: Lung volumes are normal. Film is slightly under penetrated. With this limitation in mind, there is no definite acute consolidative airspace disease, and no definite  pleural effusions. Pulmonary vasculature is normal. Heart size appears mildly enlarged, although this may be accentuated by portable technique, and patient rotation to the left, which also distorts upper mediastinal contours. Atherosclerosis in the thoracic aorta. Status post median sternotomy for CABG, including LIMA. External defibrillator pads are seen projecting over the mid to lower thorax bilaterally. Orthopedic fixation hardware in the lower cervical spine incidentally noted.  IMPRESSION: 1. No definite radiographic evidence of acute cardiopulmonary disease. 2. Heart size appears mildly enlarged. 3. Atherosclerosis. 4. Postoperative changes and support apparatus, as above.   Electronically Signed   By: Vinnie Langton M.D.   On: 08/07/2013 17:58   Cardiac Cath:  08/07/2013 Temporary Transvenous Pacemaker Placement:  A 5 Fr Transvenous Pacer Wire was advanced through the Venous Sheath under fluoroscopic guidance, and with with balloon inflated advanced into the Right Ventricular Apex. The wires were  connected to the pacing computer. Thresholds were measured & settings were set at 5 mAMP, rate of 70 bpm. TTVP distance ~65-67 mm. Coronary Anatomy:  Left Main: Moderate caliber vessel that bifurcates into the LAD & Circumflex; angiographically normal LAD: Patent proximally with large proximal septal trunk (with faint L-R collaterals to RPDA) followed by extensive calcification and 100% mid occlusion. Small proximal Diag.  D2: Perfused by patent SVG Left Circumflex: 100% proximal occlusion after Very small marginal branch.  RCA: Small to moderate caliber, dominant vessel with severe diffuse proximal and distal disease, yet TIMI 1 flow to the small caliber PDA.  Graft Anatomy:  LIMA-LAD: widely patent to a diminutive target LAD that wraps the apex; retrograde flow to a large septal perforator  SVG-Diag: widely patent with ~30-40% "hinge-point" in mid vessel; stent is widely patent.  SVG-OM & SVG-RCA:  known occlusion.  EKG: 08/09/2013 Sinus rhythm with biventricular pacing  Echo: 08/09/2013 Conclusions - Left ventricle: The cavity size was normal. The estimated ejection fraction was 40%. There is severe hypokinesis of the entireinferior myocardium. There was an increased relative contribution of atrial contraction to ventricular filling. Doppler parameters are consistent with abnormal left ventricular relaxation (grade 1 diastolic dysfunction). - Ventricular septum: Septal motion showed paradox. - Mitral valve: Mild regurgitation. - Left atrium: The atrium was mildly dilated. - Atrial septum: No defect or patent foramen ovale was identified. - Pulmonary arteries: PA peak pressure: 49mm Hg (S). Impressions: - The right ventricular systolic pressure was increased consistent with moderate pulmonary hypertension.   FOLLOW UP PLANS AND APPOINTMENTS Allergies  Allergen Reactions  . Sildenafil Nausea Only and Other (See Comments)    "took it once; heart went to pieces; never took it again"  . Percocet [Oxycodone-Acetaminophen] Other (See Comments)    Unknown reaction-possible altered mental state-per wife.    . Contrast Media [Iodinated Diagnostic Agents] Nausea Only     Medication List         albuterol 108 (90 BASE) MCG/ACT inhaler  Commonly known as:  PROVENTIL HFA;VENTOLIN HFA  Inhale 2 puffs into the lungs every 6 (six) hours as needed for wheezing or shortness of breath.     albuterol (2.5 MG/3ML) 0.083% nebulizer solution  Commonly known as:  PROVENTIL  Take 2.5 mg by nebulization 2 (two) times daily as needed for wheezing or shortness of breath.     aspirin EC 81 MG tablet  Take 81 mg by mouth at bedtime.     atorvastatin 40 MG tablet  Commonly known as:  LIPITOR  Take 40 mg by mouth at bedtime.     budesonide 0.25 MG/2ML nebulizer solution  Commonly known as:  PULMICORT  Take 0.25 mg by nebulization daily as needed (shortness of breath).     carvedilol  6.25 MG tablet  Commonly known as:  COREG  Take 6.25 mg by mouth 2 (two) times daily with a meal.     clopidogrel 75 MG tablet  Commonly known as:  PLAVIX  Take 75 mg by mouth daily with breakfast.     co-enzyme Q-10 30 MG capsule  Take 30 mg by mouth daily at 2 PM daily at 2 PM.     docusate sodium 100 MG capsule  Commonly known as:  COLACE  Take 100 mg by mouth 2 (two) times daily as needed for constipation.     gabapentin 600 MG tablet  Commonly known as:  NEURONTIN  Take 1 tablet (600 mg total) by mouth 3 (three) times daily.  GENERLAC 10 GM/15ML Soln  Generic drug:  lactulose (encephalopathy)  Take 30 g by mouth daily as needed (constipation).     HYDROcodone-acetaminophen 10-325 MG per tablet  Commonly known as:  NORCO  Take 1-2 tablets by mouth every 4 (four) hours as needed for moderate pain.     insulin NPH Human 100 UNIT/ML injection  Commonly known as:  HUMULIN N,NOVOLIN N  Inject 70 Units into the skin daily before breakfast.     ipratropium 0.02 % nebulizer solution  Commonly known as:  ATROVENT  Take 500 mcg by nebulization every 6 (six) hours as needed for wheezing or shortness of breath.     isosorbide mononitrate 30 MG 24 hr tablet  Commonly known as:  IMDUR  Take 30 mg by mouth daily.     losartan 50 MG tablet  Commonly known as:  COZAAR  Take 50 mg by mouth daily.     nitroGLYCERIN 0.4 MG SL tablet  Commonly known as:  NITROSTAT  Place 1 tablet (0.4 mg total) under the tongue every 5 (five) minutes as needed for chest pain.     OVER THE COUNTER MEDICATION  Apply 1 application topically daily as needed. Anti fungal cream     OVER THE COUNTER MEDICATION  Place 1 drop into both eyes daily as needed (dry eyes). [facility] eye drops     pantoprazole 40 MG tablet  Commonly known as:  PROTONIX  Take 1 tablet (40 mg total) by mouth daily.     potassium chloride SA 20 MEQ tablet  Commonly known as:  K-DUR,KLOR-CON  Take 20 mEq by mouth 2 (two)  times daily.     RAPAFLO 8 MG Caps capsule  Generic drug:  silodosin  Take 8 mg by mouth at bedtime.     spironolactone 25 MG tablet  Commonly known as:  ALDACTONE  Take 12.5 mg by mouth daily.     torsemide 20 MG tablet  Commonly known as:  DEMADEX  Take 60 mg by mouth 2 (two) times daily. Morning and mid afternoon        Discharge Orders   Future Appointments Provider Department Dept Phone   08/18/2013 3:30 PM [facility] [place] Office [phone]   Future Orders Complete By Expires   Diet - low sodium heart healthy  As directed    Diet Carb Modified  As directed    Increase activity slowly  As directed      Follow-up Information   Follow up with [facility] On 08/18/2013. (At 3:30 PM for wound check)    Specialty:  Cardiology   Contact information:   [address] [place] [address] [phone]      Follow up with Thompson Grayer, MD In 3 months. (Our office will call you with your appointment date and time)    Specialty:  Cardiology   Contact information:   [place] [address] [phone]       BRING ALL MEDICATIONS WITH YOU TO FOLLOW UP APPOINTMENTS  Time spent with patient to include physician time: 38 min Signed: Rosaria Ferries, PA-C 08/12/2013, 11:45 AM Co-Sign MD

## 2013-08-13 ENCOUNTER — Telehealth: Payer: Self-pay | Admitting: Cardiovascular Disease

## 2013-08-13 ENCOUNTER — Ambulatory Visit: Payer: Medicare Other | Admitting: Cardiovascular Disease

## 2013-08-13 ENCOUNTER — Ambulatory Visit: Payer: Medicare Other | Admitting: Internal Medicine

## 2013-08-13 NOTE — Telephone Encounter (Signed)
New message        Pt wife wants to know when he should hook up the transmitter for his pace maker

## 2013-08-13 NOTE — Telephone Encounter (Signed)
Spoke with patient.  Neighbor to set up transmitter since the patient is visually impaired.

## 2013-08-18 ENCOUNTER — Observation Stay (HOSPITAL_COMMUNITY)
Admission: EM | Admit: 2013-08-18 | Discharge: 2013-08-20 | Disposition: A | Discharge status: Home / Self Care | Payer: Medicare Other | Attending: Cardiovascular Disease | Admitting: Cardiovascular Disease

## 2013-08-18 ENCOUNTER — Emergency Department (HOSPITAL_COMMUNITY): Payer: Medicare Other

## 2013-08-18 ENCOUNTER — Ambulatory Visit: Payer: Medicare Other

## 2013-08-18 ENCOUNTER — Encounter (HOSPITAL_COMMUNITY): Payer: Self-pay | Admitting: Emergency Medicine

## 2013-08-18 DIAGNOSIS — Z794 Long term (current) use of insulin: Secondary | ICD-10-CM | POA: Insufficient documentation

## 2013-08-18 DIAGNOSIS — E669 Obesity, unspecified: Secondary | ICD-10-CM | POA: Insufficient documentation

## 2013-08-18 DIAGNOSIS — E7800 Pure hypercholesterolemia, unspecified: Secondary | ICD-10-CM | POA: Insufficient documentation

## 2013-08-18 DIAGNOSIS — J449 Chronic obstructive pulmonary disease, unspecified: Secondary | ICD-10-CM | POA: Insufficient documentation

## 2013-08-18 DIAGNOSIS — Z950 Presence of cardiac pacemaker: Secondary | ICD-10-CM | POA: Insufficient documentation

## 2013-08-18 DIAGNOSIS — I2789 Other specified pulmonary heart diseases: Secondary | ICD-10-CM | POA: Insufficient documentation

## 2013-08-18 DIAGNOSIS — I959 Hypotension, unspecified: Principal | ICD-10-CM

## 2013-08-18 DIAGNOSIS — G8929 Other chronic pain: Secondary | ICD-10-CM | POA: Insufficient documentation

## 2013-08-18 DIAGNOSIS — J4489 Other specified chronic obstructive pulmonary disease: Secondary | ICD-10-CM | POA: Insufficient documentation

## 2013-08-18 DIAGNOSIS — N183 Chronic kidney disease, stage 3 unspecified: Secondary | ICD-10-CM

## 2013-08-18 DIAGNOSIS — I129 Hypertensive chronic kidney disease with stage 1 through stage 4 chronic kidney disease, or unspecified chronic kidney disease: Secondary | ICD-10-CM | POA: Insufficient documentation

## 2013-08-18 DIAGNOSIS — Z7982 Long term (current) use of aspirin: Secondary | ICD-10-CM | POA: Insufficient documentation

## 2013-08-18 DIAGNOSIS — I25810 Atherosclerosis of coronary artery bypass graft(s) without angina pectoris: Secondary | ICD-10-CM | POA: Insufficient documentation

## 2013-08-18 DIAGNOSIS — I2510 Atherosclerotic heart disease of native coronary artery without angina pectoris: Secondary | ICD-10-CM | POA: Insufficient documentation

## 2013-08-18 DIAGNOSIS — I509 Heart failure, unspecified: Secondary | ICD-10-CM | POA: Insufficient documentation

## 2013-08-18 DIAGNOSIS — E1139 Type 2 diabetes mellitus with other diabetic ophthalmic complication: Secondary | ICD-10-CM | POA: Insufficient documentation

## 2013-08-18 DIAGNOSIS — Z8673 Personal history of transient ischemic attack (TIA), and cerebral infarction without residual deficits: Secondary | ICD-10-CM | POA: Insufficient documentation

## 2013-08-18 DIAGNOSIS — I5022 Chronic systolic (congestive) heart failure: Secondary | ICD-10-CM

## 2013-08-18 DIAGNOSIS — E11359 Type 2 diabetes mellitus with proliferative diabetic retinopathy without macular edema: Secondary | ICD-10-CM | POA: Insufficient documentation

## 2013-08-18 DIAGNOSIS — Z7902 Long term (current) use of antithrombotics/antiplatelets: Secondary | ICD-10-CM | POA: Insufficient documentation

## 2013-08-18 DIAGNOSIS — N259 Disorder resulting from impaired renal tubular function, unspecified: Secondary | ICD-10-CM | POA: Diagnosis present

## 2013-08-18 DIAGNOSIS — H548 Legal blindness, as defined in USA: Secondary | ICD-10-CM | POA: Insufficient documentation

## 2013-08-18 DIAGNOSIS — M1990 Unspecified osteoarthritis, unspecified site: Secondary | ICD-10-CM | POA: Insufficient documentation

## 2013-08-18 DIAGNOSIS — G609 Hereditary and idiopathic neuropathy, unspecified: Secondary | ICD-10-CM | POA: Insufficient documentation

## 2013-08-18 HISTORY — DX: Atrioventricular block, complete: I44.2

## 2013-08-18 HISTORY — DX: Presence of cardiac pacemaker: Z95.0

## 2013-08-18 LAB — URINALYSIS, ROUTINE W REFLEX MICROSCOPIC
Bilirubin Urine: NEGATIVE
GLUCOSE, UA: NEGATIVE mg/dL
HGB URINE DIPSTICK: NEGATIVE
Ketones, ur: NEGATIVE mg/dL
Leukocytes, UA: NEGATIVE
Nitrite: NEGATIVE
Protein, ur: NEGATIVE mg/dL
Specific Gravity, Urine: 1.005 (ref 1.005–1.030)
Urobilinogen, UA: 0.2 mg/dL (ref 0.0–1.0)
pH: 7.5 (ref 5.0–8.0)

## 2013-08-18 LAB — COMPREHENSIVE METABOLIC PANEL
ALK PHOS: 92 U/L (ref 39–117)
ALT: 13 U/L (ref 0–53)
AST: 15 U/L (ref 0–37)
Albumin: 3.2 g/dL — ABNORMAL LOW (ref 3.5–5.2)
BUN: 25 mg/dL — ABNORMAL HIGH (ref 6–23)
CO2: 29 mEq/L (ref 19–32)
Calcium: 8.8 mg/dL (ref 8.4–10.5)
Chloride: 98 mEq/L (ref 96–112)
Creatinine, Ser: 1.38 mg/dL — ABNORMAL HIGH (ref 0.50–1.35)
GFR calc Af Amer: 59 mL/min — ABNORMAL LOW (ref >90)
GFR calc non Af Amer: 51 mL/min — ABNORMAL LOW (ref >90)
GLUCOSE: 60 mg/dL — AB (ref 70–99)
POTASSIUM: 4.4 meq/L (ref 3.7–5.3)
SODIUM: 137 meq/L (ref 137–147)
Total Bilirubin: 0.3 mg/dL (ref 0.3–1.2)
Total Protein: 6.6 g/dL (ref 6.0–8.3)

## 2013-08-18 LAB — CBC WITH DIFFERENTIAL/PLATELET
Basophils Absolute: 0 10*3/uL (ref 0.0–0.1)
Basophils Relative: 0 % (ref 0–1)
Eosinophils Absolute: 0.3 10*3/uL (ref 0.0–0.7)
Eosinophils Relative: 3 % (ref 0–5)
HCT: 35.1 % — ABNORMAL LOW (ref 39.0–52.0)
Hemoglobin: 11.3 g/dL — ABNORMAL LOW (ref 13.0–17.0)
LYMPHS ABS: 1.8 10*3/uL (ref 0.7–4.0)
LYMPHS PCT: 19 % (ref 12–46)
MCH: 28.3 pg (ref 26.0–34.0)
MCHC: 32.2 g/dL (ref 30.0–36.0)
MCV: 88 fL (ref 78.0–100.0)
Monocytes Absolute: 1.2 10*3/uL — ABNORMAL HIGH (ref 0.1–1.0)
Monocytes Relative: 13 % — ABNORMAL HIGH (ref 3–12)
NEUTROS ABS: 6.1 10*3/uL (ref 1.7–7.7)
NEUTROS PCT: 65 % (ref 43–77)
Platelets: 207 10*3/uL (ref 150–400)
RBC: 3.99 MIL/uL — AB (ref 4.22–5.81)
RDW: 14.7 % (ref 11.5–15.5)
WBC: 9.4 10*3/uL (ref 4.0–10.5)

## 2013-08-18 LAB — GLUCOSE, CAPILLARY: GLUCOSE-CAPILLARY: 75 mg/dL (ref 70–99)

## 2013-08-18 LAB — PRO B NATRIURETIC PEPTIDE: Pro B Natriuretic peptide (BNP): 608.1 pg/mL — ABNORMAL HIGH (ref 0–125)

## 2013-08-18 LAB — TROPONIN I: Troponin I: <0.3 ng/mL (ref <0.30)

## 2013-08-18 MED ORDER — SODIUM CHLORIDE 0.9 % IV SOLN
INTRAVENOUS | Status: DC
  Administered 2013-08-18 – 2013-08-19 (×2): via INTRAVENOUS

## 2013-08-18 MED ORDER — ACETAMINOPHEN 325 MG PO TABS: 650.0000 mg | ORAL_TABLET | ORAL | Status: DC | PRN

## 2013-08-18 MED ORDER — ONDANSETRON HCL 4 MG/2ML IJ SOLN: 4.0000 mg | Freq: Four times a day (QID) | INTRAMUSCULAR | Status: DC | PRN

## 2013-08-18 MED ORDER — PANTOPRAZOLE SODIUM 40 MG PO TBEC
40.0000 mg | DELAYED_RELEASE_TABLET | Freq: Every day | ORAL | Status: DC
Start: 2013-08-18 — End: 2013-08-20
  Administered 2013-08-18 – 2013-08-20 (×3): 40 mg via ORAL
  Filled 2013-08-18 (×3): qty 1

## 2013-08-18 MED ORDER — ALPRAZOLAM 0.25 MG PO TABS: 0.2500 mg | ORAL_TABLET | Freq: Two times a day (BID) | ORAL | Status: DC | PRN

## 2013-08-18 MED ORDER — BUDESONIDE 0.25 MG/2ML IN SUSP
0.2500 mg | Freq: Every day | RESPIRATORY_TRACT | Status: DC | PRN
Start: 2013-08-18 — End: 2013-08-20
  Filled 2013-08-18: qty 2

## 2013-08-18 MED ORDER — TAMSULOSIN HCL 0.4 MG PO CAPS
0.4000 mg | ORAL_CAPSULE | Freq: Every day | ORAL | Status: DC
  Administered 2013-08-18 – 2013-08-20 (×3): 0.4 mg via ORAL
  Filled 2013-08-18 (×3): qty 1

## 2013-08-18 MED ORDER — ALBUTEROL SULFATE HFA 108 (90 BASE) MCG/ACT IN AERS: 2.0000 | INHALATION_SPRAY | Freq: Four times a day (QID) | RESPIRATORY_TRACT | Status: DC | PRN

## 2013-08-18 MED ORDER — ENOXAPARIN SODIUM 40 MG/0.4ML ~~LOC~~ SOLN
40.0000 mg | SUBCUTANEOUS | Status: DC
  Administered 2013-08-18 – 2013-08-19 (×2): 40 mg via SUBCUTANEOUS
  Filled 2013-08-18 (×3): qty 0.4

## 2013-08-18 MED ORDER — COENZYME Q10 30 MG PO CAPS: 30.0000 mg | ORAL_CAPSULE | Freq: Every day | ORAL | Status: DC

## 2013-08-18 MED ORDER — CLOPIDOGREL BISULFATE 75 MG PO TABS
75.0000 mg | ORAL_TABLET | Freq: Every day | ORAL | Status: DC
  Administered 2013-08-19 – 2013-08-20 (×2): 75 mg via ORAL
  Filled 2013-08-18 (×3): qty 1

## 2013-08-18 MED ORDER — GABAPENTIN 600 MG PO TABS
600.0000 mg | ORAL_TABLET | Freq: Three times a day (TID) | ORAL | Status: DC
  Administered 2013-08-18 – 2013-08-20 (×5): 600 mg via ORAL
  Filled 2013-08-18 (×7): qty 1

## 2013-08-18 MED ORDER — ASPIRIN EC 81 MG PO TBEC
81.0000 mg | DELAYED_RELEASE_TABLET | Freq: Every day | ORAL | Status: DC
  Administered 2013-08-18 – 2013-08-19 (×2): 81 mg via ORAL
  Filled 2013-08-18 (×3): qty 1

## 2013-08-18 MED ORDER — ALBUTEROL SULFATE (2.5 MG/3ML) 0.083% IN NEBU: 2.5000 mg | INHALATION_SOLUTION | Freq: Two times a day (BID) | RESPIRATORY_TRACT | Status: DC | PRN

## 2013-08-18 MED ORDER — ISOSORBIDE MONONITRATE ER 30 MG PO TB24
30.0000 mg | ORAL_TABLET | Freq: Every day | ORAL | Status: DC
  Administered 2013-08-19 – 2013-08-20 (×2): 30 mg via ORAL
  Filled 2013-08-18 (×2): qty 1

## 2013-08-18 MED ORDER — ALBUTEROL SULFATE (2.5 MG/3ML) 0.083% IN NEBU: 2.5000 mg | INHALATION_SOLUTION | Freq: Four times a day (QID) | RESPIRATORY_TRACT | Status: DC | PRN

## 2013-08-18 MED ORDER — NITROGLYCERIN 0.4 MG SL SUBL: 0.4000 mg | SUBLINGUAL_TABLET | SUBLINGUAL | Status: DC | PRN

## 2013-08-18 MED ORDER — IPRATROPIUM BROMIDE 0.02 % IN SOLN: 500.0000 ug | Freq: Four times a day (QID) | RESPIRATORY_TRACT | Status: DC | PRN

## 2013-08-18 MED ORDER — CARVEDILOL 3.125 MG PO TABS
3.1250 mg | ORAL_TABLET | Freq: Two times a day (BID) | ORAL | Status: DC
Start: 2013-08-18 — End: 2013-08-20
  Administered 2013-08-18 – 2013-08-19 (×3): 3.125 mg via ORAL
  Filled 2013-08-18 (×6): qty 1

## 2013-08-18 MED ORDER — DOCUSATE SODIUM 100 MG PO CAPS
100.0000 mg | ORAL_CAPSULE | Freq: Two times a day (BID) | ORAL | Status: DC
  Administered 2013-08-19 – 2013-08-20 (×2): 100 mg via ORAL
  Filled 2013-08-18 (×5): qty 1

## 2013-08-18 MED ORDER — ATORVASTATIN CALCIUM 40 MG PO TABS
40.0000 mg | ORAL_TABLET | Freq: Every day | ORAL | Status: DC
  Administered 2013-08-18 – 2013-08-19 (×2): 40 mg via ORAL
  Filled 2013-08-18 (×3): qty 1

## 2013-08-18 MED ORDER — DEXTROSE 50 % IV SOLN: 25.0000 g | Freq: Once | INTRAVENOUS | Status: DC

## 2013-08-18 MED ORDER — HYDROCODONE-ACETAMINOPHEN 10-325 MG PO TABS
1.0000 | ORAL_TABLET | ORAL | Status: DC | PRN
  Administered 2013-08-18 – 2013-08-20 (×8): 2 via ORAL
  Filled 2013-08-18 (×8): qty 2

## 2013-08-18 MED ORDER — INSULIN NPH (HUMAN) (ISOPHANE) 100 UNIT/ML ~~LOC~~ SUSP
70.0000 [IU] | Freq: Every day | SUBCUTANEOUS | Status: DC
  Administered 2013-08-19: 70 [IU] via SUBCUTANEOUS
  Filled 2013-08-18: qty 10

## 2013-08-18 NOTE — ED Provider Notes (Signed)
CSN: [id number]     Arrival date & time 08/18/13  1025 History   First MD Initiated Contact with Patient 08/18/13 1058     Chief Complaint  Patient presents with  . Hypotension  . Fatigue   (Consider location/radiation/quality/duration/timing/severity/associated sxs/prior Treatment) The history is provided by the patient.   patient had a pacemaker placed a week and a half ago for heart block. He states his physical therapists were at his house today and found him to be hypotensive with pressures in the 80s. Patient states she's felt somewhat fatigued. No chest pain. No cough. No fevers. No dysuria. He states he did have some constipation he do a disimpaction yesterday. Some mild abdominal cramping. He states he took his medicines this morning. He states that he has had chronic left shoulder pain, but since the pacemaker he has a sharp pain behind his left shoulder. Past Medical History  Diagnosis Date  . CORONARY ARTERY DISEASE     a. s/p CABG 1998. b.  08/2011: inferior STEMI.  LHC 09/07/11: Severe 3v CAD, LIMA-LAD patent, SVG-RCA chronically occluded, SVG-circumflex chronically occluded, SVG-diagonal 95%. PCI: Promus DES to the SVG-diagonal.  Procedure c/b transient CHB req CPR and TTVP. c. Myoview 11/2011: scar but no ischemia.   [name] DIABETES MELLITUS, TYPE I   . HYPERTENSION   . 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 HYPERCHOLESTEROLEMIA   . CERVICAL RADICULOPATHY, LEFT   . PERIPHERAL NEUROPATHY   . Chronic systolic heart failure     Echocardiogram 08/09/13:Cavity size normal, EF 40%, inferior hypokinesis, grade 1 diastolic dysfunction, paradoxical septal motion, mild MR, mild LAE, no PFO, PAS 51, moderate pulm HTN  . RENAL INSUFFICIENCY 03/15/2010  . ED (erectile dysfunction)   . Hypogonadism male   . GI bleed 2012    Multiple Duodenal ulcer   . Obesity   . Legally blind     bilaterally  . Stroke 66    age 32 months; residual right sided weakness  . History of blood transfusion  2012  . Charcot foot due to diabetes mellitus     chronic pain  . Osteoarthritis     right hip and shoulder  . COPD (chronic obstructive pulmonary disease)   . PERIPHERAL VASCULAR INSUFFICIENCY,  LEGS, BILATERAL 08/17/2010    ABI's performed 04/19/11 R 0.72 L 0.71  . ANEMIA-IRON DEFICIENCY 09/23/2008    ulcer related last year  . Chronic edema     Chronic pedal edema  . Hiatal hernia   . Chronic stasis dermatitis     Chronic stasis changes.  . CKD (chronic kidney disease), stage III   . Syncope     a. 07/2013: suspected due to hypotension.   Past Surgical History  Procedure Laterality Date  . Carpal tunnel release      left  . Sigmoidoscopy  03/11/2001  . Venous doppler  01/30/2004  . Tonsillectomy and adenoidectomy      "as a kid"  . Cataract extraction w/ intraocular lens  implant, bilateral    . Coronary angioplasty with stent placement  08/2011    "1"  . Retinopathy surgery  2000's    "laser; both eyes"  . Wrist fusion  1976    right  . Anterior fusion cervical spine  2010    "C spine; Dr. Ronnald Ramp"  . Ankle fracture surgery  1976; ?date    LEFT:  fused; removed bulk of hardware  . Heel spur surgery  ~ 2007    ? left  . Irrigation  and debridement knee  02/27/2012    Procedure: IRRIGATION AND DEBRIDEMENT KNEE;  Surgeon: Augustin Schooling, MD;  Location: [facility];  Service: Orthopedics;  Laterality: Right;  irrigation and drainage right knee septic bursitis  . Eye surgery  2008    cataract removal, cautery  . Coronary artery bypass graft  1998    CABG X5  . Fracture surgery      foot broken 1976  . Pacemaker insertion  08/08/2013    Biventricular pacemaker: [employer] RF model [id number] (serial number [id number])   Family History  Problem Relation Age of Onset  . Heart disease Mother     Coronary Artery Disease  . Heart disease Father     Coronary Artery Disease  . Heart disease Paternal Grandmother     Coronary Artery Disease  . Colon cancer Neg Hx   .  Diabetes Other     Grandmother   History  Substance Use Topics  . Smoking status: Never Smoker   . Smokeless tobacco: Never Used  . Alcohol Use: No    Review of Systems  Constitutional: Positive for fatigue. Negative for chills, activity change and appetite change.  Eyes: Negative for pain.  Respiratory: Negative for chest tightness and shortness of breath.   Cardiovascular: Negative for chest pain and leg swelling.  Gastrointestinal: Positive for constipation. Negative for nausea, vomiting, abdominal pain and diarrhea.  Genitourinary: Negative for flank pain.  Musculoskeletal: Negative for back pain and neck stiffness.  Skin: Negative for rash.  Neurological: Negative for weakness, numbness and headaches.  Psychiatric/Behavioral: Negative for behavioral problems.    Allergies  Sildenafil; Percocet; and Contrast media  Home Medications   Current Outpatient Rx  Name  Route  Sig  Dispense  Refill  . albuterol (PROVENTIL HFA;VENTOLIN HFA) 108 (90 BASE) MCG/ACT inhaler   Inhalation   Inhale 2 puffs into the lungs every 6 (six) hours as needed for wheezing or shortness of breath.          [name] albuterol (PROVENTIL) (2.5 MG/3ML) 0.083% nebulizer solution   Nebulization   Take 2.5 mg by nebulization 2 (two) times daily as needed for wheezing or shortness of breath.          [name] aspirin EC 81 MG tablet   Oral   Take 81 mg by mouth at bedtime.         [name] atorvastatin (LIPITOR) 40 MG tablet   Oral   Take 40 mg by mouth at bedtime.          . budesonide (PULMICORT) 0.25 MG/2ML nebulizer solution   Nebulization   Take 0.25 mg by nebulization daily as needed (shortness of breath).          . carvedilol (COREG) 6.25 MG tablet   Oral   Take 6.25 mg by mouth 2 (two) times daily with a meal.         . clopidogrel (PLAVIX) 75 MG tablet   Oral   Take 75 mg by mouth daily with breakfast.         . co-enzyme Q-10 30 MG capsule   Oral   Take 30 mg by mouth daily at 2 PM  daily at 2 PM.          . docusate sodium (COLACE) 100 MG capsule   Oral   Take 100 mg by mouth 2 (two) times daily as needed for constipation.         . gabapentin (NEURONTIN) 600  MG tablet   Oral   Take 1 tablet (600 mg total) by mouth 3 (three) times daily.   90 tablet   3   . HYDROcodone-acetaminophen (NORCO) 10-325 MG per tablet   Oral   Take 1-2 tablets by mouth every 4 (four) hours as needed for moderate pain.   60 tablet   0   . insulin NPH (HUMULIN N,NOVOLIN N) 100 UNIT/ML injection   Subcutaneous   Inject 70 Units into the skin daily before breakfast.   2 vial   2   . ipratropium (ATROVENT) 0.02 % nebulizer solution   Nebulization   Take 500 mcg by nebulization every 6 (six) hours as needed for wheezing or shortness of breath.          . isosorbide mononitrate (IMDUR) 30 MG 24 hr tablet   Oral   Take 30 mg by mouth daily.         [name] losartan (COZAAR) 50 MG tablet   Oral   Take 50 mg by mouth daily.         . nitroGLYCERIN (NITROSTAT) 0.4 MG SL tablet   Sublingual   Place 1 tablet (0.4 mg total) under the tongue every 5 (five) minutes as needed for chest pain.   25 tablet   5   . OVER THE COUNTER MEDICATION   Topical   Apply 1 application topically daily as needed. Anti fungal cream         . OVER THE COUNTER MEDICATION   Both Eyes   Place 1 drop into both eyes daily as needed (dry eyes). [facility] eye drops         . pantoprazole (PROTONIX) 40 MG tablet   Oral   Take 1 tablet (40 mg total) by mouth daily.   30 tablet   3   . potassium chloride SA (K-DUR,KLOR-CON) 20 MEQ tablet   Oral   Take 20 mEq by mouth 2 (two) times daily.         . silodosin (RAPAFLO) 8 MG CAPS capsule   Oral   Take 8 mg by mouth at bedtime.          [name] spironolactone (ALDACTONE) 25 MG tablet   Oral   Take 12.5 mg by mouth daily.         [name] torsemide (DEMADEX) 20 MG tablet   Oral   Take 60 mg by mouth 2 (two) times daily. Morning and mid afternoon           BP 99/43  Pulse 59  Temp(Src) 97.7 F (36.5 C) (Oral)  Resp 16  Wt 219 lb 1 oz (99.366 kg)  SpO2 98% Physical Exam  Nursing note and vitals reviewed. Constitutional: He is oriented to person, place, and time. He appears well-developed and well-nourished.  Patient is obese  HENT:  Head: Normocephalic and atraumatic.  Eyes: EOM are normal. Pupils are equal, round, and reactive to light.  Neck: Normal range of motion. Neck supple.  Cardiovascular: Normal rate, regular rhythm and normal heart sounds.   No murmur heard. Pulmonary/Chest: Effort normal and breath sounds normal.  Dressing over left upper chest pacemaker site. Some ecchymosis in the area.  Abdominal: Soft. Bowel sounds are normal. He exhibits no distension and no mass. There is no tenderness. There is no rebound and no guarding.  Musculoskeletal: Normal range of motion. He exhibits edema.  Bilateral lower extremity edema with chronic venous changes  Neurological: He is alert and oriented to person, place,  and time. No cranial nerve deficit.  Skin: Skin is warm and dry.  Psychiatric: He has a normal mood and affect.    ED Course  Procedures (including critical care time) Labs Review Labs Reviewed  CBC WITH DIFFERENTIAL - Abnormal; Notable for the following:    RBC 3.99 (*)    Hemoglobin 11.3 (*)    HCT 35.1 (*)    Monocytes Relative 13 (*)    Monocytes Absolute 1.2 (*)    All other components within normal limits  COMPREHENSIVE METABOLIC PANEL - Abnormal; Notable for the following:    Glucose, Bld 60 (*)    BUN 25 (*)    Creatinine, Ser 1.38 (*)    Albumin 3.2 (*)    GFR calc non Af Amer 51 (*)    GFR calc Af Amer 59 (*)    All other components within normal limits  PRO B NATRIURETIC PEPTIDE - Abnormal; Notable for the following:    Pro B Natriuretic peptide (BNP) 608.1 (*)    All other components within normal limits  TROPONIN I  URINALYSIS, ROUTINE W REFLEX MICROSCOPIC  GLUCOSE, CAPILLARY    Imaging Review Dg Chest 2 View  08/18/2013   CLINICAL DATA:  Shortness of breath.  EXAM: CHEST  2 VIEW  COMPARISON:  August 09, 2013.  FINDINGS: Stable cardiomediastinal silhouette. Status post coronary artery bypass graft. Left-sided pacemaker is unchanged in position. No pneumothorax or pleural effusion is noted. Degenerative changes of lower thoracic spine are noted. Linear density is noted in lingular region consistent with subsegmental atelectasis. Right lung is clear.  IMPRESSION: Probable subsegmental atelectasis seen in lingular region. Stable chronic findings as described above.   Electronically Signed   By: Sabino Dick M.D.   On: 08/18/2013 12:03    EKG Interpretation    Date/Time:  Monday August 18 2013 10:42:05 EST Ventricular Rate:  63 PR Interval:  194 QRS Duration: 170 QT Interval:  506 QTC Calculation: 517 R Axis:   -77 Text Interpretation:  Atrial-sensed ventricular-paced rhythm Biventricular pacemaker detected Abnormal ECG Confirmed by Juanell Saffo  MD, Avielle Imbert ([id number]) on 08/18/2013 11:10:19 AM            MDM   1. Hypotension   2. Chronic kidney disease, stage III (moderate)   3. Chronic systolic heart failure    Patient with hypotension. Recent admission for pacemaker. Creatinine is mildly increased. To be seen by cardiology.    Jasper Riling. Alvino Chapel, MD 08/18/13 1550

## 2013-08-18 NOTE — Progress Notes (Signed)
PHARMACIST - PHYSICIAN ORDER COMMUNICATION  CONCERNING: P&T Medication Policy on Herbal Medications  DESCRIPTION:  This patient's order for:  Coenzyme Q  has been noted.  This product(s) is classified as an "herbal" or natural product. Due to a lack of definitive safety studies or FDA approval, nonstandard manufacturing practices, plus the potential risk of unknown drug-drug interactions while on inpatient medications, the Pharmacy and Therapeutics Committee does not permit the use of "herbal" or natural products of this type within [facility].   ACTION TAKEN: The pharmacy department is unable to verify this order at this time and your patient has been informed of this safety policy. Please reevaluate patient's clinical condition at discharge and address if the herbal or natural product(s) should be resumed at that time.  Heide Guile, PharmD, BCPS Clinical Pharmacist Pager [phone]

## 2013-08-18 NOTE — Progress Notes (Addendum)
   CARE MANAGEMENT ED NOTE 08/18/2013  Patient:  Mark French, Mark French   Account Number:  [id number]  Date Initiated:  08/18/2013  Documentation initiated by:  [facility]  Subjective/Objective Assessment:   Pt presented to ED with Hypostension and fatigue patient had a pacemaker placed a week and a half ago for heart block. He states his physical therapists were at his house today and found him to be hypotensive with pressures in the 80s.     Subjective/Objective Assessment Detail:     Action/Plan:   Action/Plan Detail:   Anticipated DC Date:       Status Recommendation to Physician:   Result of Recommendation:  Agreed  Other ED Services  Consult Working Plan   In-house referral  Clinical Social Worker     Choice offered to / List presented to:            Status of service:  Completed, signed off  ED Comments:   ED Comments Detail:  08/18/13 1640 W. Arayah Krouse RN BSN [phone] ED CM received consult regarding insurance question. Pt presented to ED with low blood pressure and fatigue. Pt recently had Left Cardiac Cath and a Pacemaker placed for Heart Block. While at home working with Physical therapist that suggested pt come to ED for hyoertension. Pt has [employer] Medicare Advantage. Called [employer] to verify coverage for OBS and medication coverage. Verified coverage for OBS and Medications covered in OBS status with [employer]. Spoke with patient and wife and informed them of OBS coverage as per [facility]. Pt is presently active with [facility]. Services will resume upon discharge home. SW referral also placed for follow up with assistance on insurance coverage for OBS issue. [facility] made aware of patient admission. CM will continue to follow up.

## 2013-08-18 NOTE — Telephone Encounter (Signed)
Pt has wound care check for pacemaker for today. Cancelled this appointment and informed device clinic so they may reschedule since he is going to hospital.

## 2013-08-18 NOTE — ED Notes (Signed)
Pt had pacer placed 1 week ago and went to PT today and bp 80/40 and pt complains of being weak today.  Complains of left shoulder and back pain

## 2013-08-18 NOTE — ED Notes (Signed)
Patient transported to X-ray 

## 2013-08-18 NOTE — ED Notes (Signed)
MD at bedside. Cards in to see patient , attempted IV times 2 unsuccessful second RN to try

## 2013-08-18 NOTE — ED Notes (Signed)
Called to see pt re: insurance issues.  Spoke with Wanda, RNCM who had met with pt/wife and all questions/concerns re: insurance coverage were answered.  No further CSW needs identified.  CSW will sign off.  Please re-consult if any social work needs arise. 

## 2013-08-18 NOTE — Consult Note (Signed)
CARDIOLOGY CONSULT NOTE   Patient ID: Mark French MRN: [id number] DOB/AGE: [birth year] 68 y.o.  Admit date: 08/18/2013  Primary Physician   [name], MD Primary Cardiologist  MC/JA Reason for Consultation   Weakness, hypotension  [id number] Mark French is a 68 y.o. male with a history of CAD and CHB.   Hospitalized 01/01-01/01/2014 with SOB and chest pain. He had a cardiac catheterization which showed severe native 3 vessel disease with LIMA-LAD and SVG-Diag patent, CFX occluded and RCA with severe diffuse proximal and distal disease, TIMI 1 flow to PDA. The SVG-OM and SVG-RCA are known occluded. Medical therapy was recommended.   He had CHB and a temporary PPM was placed. After he was otherwise medically stabilized, he had a [employer] Jude BiV PPM placed, on 01/02. His EF was 40% by echo.  He required diuresis and his d/c weight was 103.15 kg.   Since d/c, he has done well. He has chronic pain issues including left upper chest and shoulder pain from an injury in December. He has had no problems with the PPM site, although he has pain in that area. He has been unsure how much to drink daily, has been eating OK. He was constipated yesterday, s/p manual disempaction and had explosive diarrhea today.   Today, he was visited by the [facility]. His SBP was noted to be 80s. His CBG was 150 this am. He felt weak. He came to the ER, where SBP was in the 90s, pacing well, CBG 60. His BUN/Cr are higher than normal for him. His weight is 4 kg lower than at d/c.   He denies SOB or new chest pain. No fevers or chills, no bleeding issues, no dysuria, no additional symptoms. He has been drinking 2- 16 oz bottles of water daily and eats a lot of fruit. He feels weak.   Past Medical History  Diagnosis Date  . CORONARY ARTERY DISEASE     a. s/p CABG 1998. b.  08/2011: inferior STEMI.  LHC 09/07/11: Severe 3v CAD, LIMA-LAD patent, SVG-RCA chronically occluded, SVG-circumflex chronically occluded,  SVG-diagonal 95%. PCI: Promus DES to the SVG-diagonal.  Procedure c/b transient CHB req CPR and TTVP. c. Myoview 11/2011: scar but no ischemia.   [name] DIABETES MELLITUS, TYPE I   . HYPERTENSION   . 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 HYPERCHOLESTEROLEMIA   . CERVICAL RADICULOPATHY, LEFT   . PERIPHERAL NEUROPATHY   . Chronic systolic heart failure     Echocardiogram 08/09/13:Cavity size normal, EF 40%, inferior hypokinesis, grade 1 diastolic dysfunction, paradoxical septal motion, mild MR, mild LAE, no PFO, PAS 51, moderate pulm HTN  . RENAL INSUFFICIENCY 03/15/2010  . ED (erectile dysfunction)   . Hypogonadism male   . GI bleed 2012    Multiple Duodenal ulcer   . Obesity   . Legally blind     bilaterally  . Stroke 49    age 68 months; residual right sided weakness  . History of blood transfusion 2012  . Charcot foot due to diabetes mellitus     chronic pain  . Osteoarthritis     right hip and shoulder  . COPD (chronic obstructive pulmonary disease)   . PERIPHERAL VASCULAR INSUFFICIENCY,  LEGS, BILATERAL 08/17/2010    ABI's performed 04/19/11 R 0.72 L 0.71  . ANEMIA-IRON DEFICIENCY 09/23/2008    ulcer related last year  . Chronic edema     Chronic pedal edema  . Hiatal hernia   . Chronic stasis  dermatitis     Chronic stasis changes.  . CKD (chronic kidney disease), stage III   . Syncope     a. 07/2013: suspected due to hypotension.     Past Surgical History  Procedure Laterality Date  . Carpal tunnel release      left  . Sigmoidoscopy  03/11/2001  . Venous doppler  01/30/2004  . Tonsillectomy and adenoidectomy      "as a kid"  . Cataract extraction w/ intraocular lens  implant, bilateral    . Coronary angioplasty with stent placement  08/2011    "1"  . Retinopathy surgery  2000's    "laser; both eyes"  . Wrist fusion  1976    right  . Anterior fusion cervical spine  2010    "C spine; Dr. Ronnald Ramp"  . Ankle fracture surgery  1976; ?date    LEFT:  fused; removed  bulk of hardware  . Heel spur surgery  ~ 2007    ? left  . Irrigation and debridement knee  02/27/2012    Procedure: IRRIGATION AND DEBRIDEMENT KNEE;  Surgeon: Augustin Schooling, MD;  Location: [facility];  Service: Orthopedics;  Laterality: Right;  irrigation and drainage right knee septic bursitis  . Eye surgery  2008    cataract removal, cautery  . Coronary artery bypass graft  1998    CABG X5  . Fracture surgery      foot broken 1976  . Pacemaker insertion  08/08/2013    Biventricular pacemaker: [employer] RF model [id number] (serial number [id number])    Allergies  Allergen Reactions  . Sildenafil Nausea Only and Other (See Comments)    "took it once; heart went to pieces; never took it again"  . Percocet [Oxycodone-Acetaminophen] Other (See Comments)    Unknown reaction-possible altered mental state-per wife.    . Contrast Media [Iodinated Diagnostic Agents] Nausea Only    I have reviewed the patient's current medications Prior to Admission medications   Medication Sig  albuterol (PROVENTIL HFA;VENTOLIN HFA) 108 (90 BASE) MCG/ACT inhaler Inhale 2 puffs into the lungs every 6 (six) hours as needed for wheezing or shortness of breath.   albuterol (PROVENTIL) (2.5 MG/3ML) 0.083% nebulizer solution Take 2.5 mg by nebulization 2 (two) times daily as needed for wheezing or shortness of breath.   aspirin EC 81 MG tablet Take 81 mg by mouth at bedtime.  atorvastatin (LIPITOR) 40 MG tablet Take 40 mg by mouth at bedtime.   budesonide (PULMICORT) 0.25 MG/2ML nebulizer solution Take 0.25 mg by nebulization daily as needed (shortness of breath).   carvedilol (COREG) 6.25 MG tablet Take 6.25 mg by mouth 2 (two) times daily with a meal.  clopidogrel (PLAVIX) 75 MG tablet Take 75 mg by mouth daily with breakfast.  co-enzyme Q-10 30 MG capsule Take 30 mg by mouth daily at 2 PM daily at 2 PM.   docusate sodium (COLACE) 100 MG capsule Take 100 mg by mouth 2 (two) times daily as needed for  constipation.  gabapentin (NEURONTIN) 600 MG tablet Take 1 tablet (600 mg total) by mouth 3 (three) times daily.  HYDROcodone-acetaminophen (NORCO) 10-325 MG per tablet Take 1-2 tablets by mouth every 4 (four) hours as needed for moderate pain.  insulin NPH (HUMULIN N,NOVOLIN N) 100 UNIT/ML injection Inject 70 Units into the skin daily before breakfast.  ipratropium (ATROVENT) 0.02 % nebulizer solution Take 500 mcg by nebulization every 6 (six) hours as needed for wheezing or shortness of breath.  isosorbide mononitrate (IMDUR) 30 MG 24 hr tablet Take 30 mg by mouth daily.  losartan (COZAAR) 50 MG tablet Take 50 mg by mouth daily.  nitroGLYCERIN (NITROSTAT) 0.4 MG SL tablet Place 1 tablet (0.4 mg total) under the tongue every 5 (five) minutes as needed for chest pain.  OVER THE COUNTER MEDICATION Apply 1 application topically daily as needed. Anti fungal cream  OVER THE COUNTER MEDICATION Place 1 drop into both eyes daily as needed (dry eyes). [facility] eye drops  pantoprazole (PROTONIX) 40 MG tablet Take 1 tablet (40 mg total) by mouth daily.  potassium chloride SA (K-DUR,KLOR-CON) 20 MEQ tablet Take 20 mEq by mouth 2 (two) times daily.  silodosin (RAPAFLO) 8 MG CAPS capsule Take 8 mg by mouth at bedtime.   spironolactone (ALDACTONE) 25 MG tablet Take 12.5 mg by mouth daily.  torsemide (DEMADEX) 20 MG tablet Take 60 mg by mouth 2 (two) times daily. Morning and mid afternoon     History   Social History  . Marital Status: Married    Spouse Name: N/A    Number of Children: 0  . Years of Education: N/A   Occupational History  . Disabled    Social History Main Topics  . Smoking status: Never Smoker   . Smokeless tobacco: Never Used  . Alcohol Use: No  . Drug Use: No  . Sexual Activity: No   Other Topics Concern  . Not on file   Social History Narrative   Comes to appointments with wife      0 Caffeine drinks daily     Family Status  Relation Status Death Age  . Mother Alive    . Father Deceased   . Sister Alive   . Brother Alive   . Brother Alive    Family History  Problem Relation Age of Onset  . Heart disease Mother     Coronary Artery Disease  . Heart disease Father     Coronary Artery Disease  . Heart disease Paternal Grandmother     Coronary Artery Disease  . Colon cancer Neg Hx   . Diabetes Other     Grandmother     ROS:  Full 14 point review of systems complete and found to be negative unless listed above.  Physical Exam: Blood pressure 90/53, pulse 60, temperature 97.7 F (36.5 C), temperature source Oral, resp. rate 18, weight 219 lb 1 oz (99.366 kg), SpO2 98.00%.  General: Well developed, well nourished, male in no acute distress Head: Eyes PERLA, No xanthomas.   Normocephalic and atraumatic, oropharynx without edema or exudate. Dentition: poor Lungs: few rales bases Heart: HRRR S1 S2, no rub/gallop,  no murmur. pulses are 2+ upper extrem, decreased in bilateral LE.   Neck: No carotid bruits. No lymphadenopathy.  JVD not elevated Abdomen: Bowel sounds present, abdomen soft and non-tender without masses or hernias noted. Msk:  No spine or cva tenderness. No weakness, no joint deformities or effusions. Extremities: No clubbing or cyanosis. trace edema.  Neuro: Alert and oriented X 3. No focal deficits noted. Psych:  Good affect, responds appropriately Skin: No rashes or lesions noted. Chronic stasis changes, dry skin.  Labs:   Lab Results  Component Value Date   WBC 9.4 08/18/2013   HGB 11.3* 08/18/2013   HCT 35.1* 08/18/2013   MCV 88.0 08/18/2013   PLT 207 08/18/2013      Recent Labs Lab 08/18/13 1232  NA 137  K 4.4  CL 98  CO2 29  BUN 25*  CREATININE 1.38*  CALCIUM 8.8  PROT 6.6  BILITOT 0.3  ALKPHOS 92  ALT 13  AST 15  GLUCOSE 60*  ALBUMIN 3.2*   Magnesium  Date Value Range Status  08/08/2013 2.5  1.5 - 2.5 mg/dL Final    Recent Labs  08/18/13 1232  TROPONINI <0.30   Pro B Natriuretic peptide (BNP)    Date/Time Value Range Status  08/18/2013 12:32 PM 608.1* 0 - 125 pg/mL Final  08/07/2013  5:45 PM 568.0* 0 - 125 pg/mL Final    Echo: 08/09/2013 Study Conclusions - Left ventricle: The cavity size was normal. The estimated ejection fraction was 40%. There is severe hypokinesis of the entire inferior myocardium. There was an increased relative contribution of atrial contraction to ventricular filling. Doppler parameters are consistent with abnormal left ventricular relaxation (grade 1 diastolic dysfunction). - Ventricular septum: Septal motion showed paradox. - Mitral valve: Mild regurgitation. - Left atrium: The atrium was mildly dilated. - Atrial septum: No defect or patent foramen ovale was identified. - Pulmonary arteries: PA peak pressure: 64mm Hg (S). Impressions: - The right ventricular systolic pressure was increased consistent with moderate pulmonary hypertension.   ECG:   08/18/2013 SR, BiV-pacing Vent. rate 63 BPM PR interval 194 ms QRS duration 170 ms QT/QTc 506/517 ms P-R-T axes 58 -77 144  Radiology:  Dg Chest 2 View 08/18/2013   CLINICAL DATA:  Shortness of breath.  EXAM: CHEST  2 VIEW  COMPARISON:  August 09, 2013.  FINDINGS: Stable cardiomediastinal silhouette. Status post coronary artery bypass graft. Left-sided pacemaker is unchanged in position. No pneumothorax or pleural effusion is noted. Degenerative changes of lower thoracic spine are noted. Linear density is noted in lingular region consistent with subsegmental atelectasis. Right lung is clear.  IMPRESSION: Probable subsegmental atelectasis seen in lingular region. Stable chronic findings as described above.   Electronically Signed   By: Sabino Dick M.D.   On: 08/18/2013 12:03    ASSESSMENT AND PLAN:   The patient was seen today by Dr. Angelena Form, the patient evaluated and the data reviewed.  Principal Problem:   Hypotension - gentle hydration, follow BP, daily weights. Discussed need to manage oral intake  with 32 oz water daily, OK to eat fruit. If pt should increase H2O intake, MD advise.  Active Problems:   Chronic systolic heart failure - volume status seems a little dry, cannot hydrate rapidly, but OK to give gently hydration.     RENAL INSUFFICIENCY - BUN/Cr a little above baseline, follow.  Plan: gentle hydration, then re-assess weakness and BP, possible d/c in am if doing better.   SignedRosaria Ferries, PA-C 08/18/2013 2:50 PM Beeper [phone]  I have personally seen and examined this patient with Rosaria Ferries, PA-C. I agree with the assessment and plan as outlined above. Presenting with weakness and fatigue. PPM functioning. No obvious signs of infection. NO chest pain. It appears that he has been overdiuresed. Will admit to telemetry given hypotension. Gentle hydration. Hold diuretics tonight. Check orthostatics.   Blondine Hottel 08/18/2013 3:48 PM

## 2013-08-18 NOTE — Telephone Encounter (Signed)
Per physical therapist at home patient's BP 80/40. Pt c/o feeling "like a truck hit me" He took all BP medications at 7 am Pt states he has not been drinking very much over last several days. Discussed with Dr. Antionette Char primary nurse. Instructed pt to go to ED as he will likely need IV fluids. Patient and wife verbalize understanding. Called Trish to inform.

## 2013-08-18 NOTE — Progress Notes (Signed)
Unit CM UR Completed by [facility] ED CM  W. Zacory Fiola RN  

## 2013-08-18 NOTE — Telephone Encounter (Signed)
Follow up     Home health in the home now.   C/o  Vital sign 80/40 sitting in recliner.  Sob.   Has appt for wound check today  .

## 2013-08-18 NOTE — ED Notes (Signed)
Report to [facility] RN.

## 2013-08-19 LAB — COMPREHENSIVE METABOLIC PANEL
ALT: 13 U/L (ref 0–53)
AST: 13 U/L (ref 0–37)
Albumin: 3 g/dL — ABNORMAL LOW (ref 3.5–5.2)
Alkaline Phosphatase: 92 U/L (ref 39–117)
BUN: 22 mg/dL (ref 6–23)
CALCIUM: 8.3 mg/dL — AB (ref 8.4–10.5)
CO2: 28 mEq/L (ref 19–32)
Chloride: 99 mEq/L (ref 96–112)
Creatinine, Ser: 1.36 mg/dL — ABNORMAL HIGH (ref 0.50–1.35)
GFR, EST AFRICAN AMERICAN: 60 mL/min — AB (ref >90)
GFR, EST NON AFRICAN AMERICAN: 52 mL/min — AB (ref >90)
GLUCOSE: 146 mg/dL — AB (ref 70–99)
Potassium: 4.2 mEq/L (ref 3.7–5.3)
Sodium: 137 mEq/L (ref 137–147)
Total Bilirubin: 0.2 mg/dL — ABNORMAL LOW (ref 0.3–1.2)
Total Protein: 6.4 g/dL (ref 6.0–8.3)

## 2013-08-19 LAB — GLUCOSE, CAPILLARY
GLUCOSE-CAPILLARY: 95 mg/dL (ref 70–99)
Glucose-Capillary: 167 mg/dL — ABNORMAL HIGH (ref 70–99)
Glucose-Capillary: 209 mg/dL — ABNORMAL HIGH (ref 70–99)
Glucose-Capillary: 95 mg/dL (ref 70–99)
Glucose-Capillary: 114 mg/dL — ABNORMAL HIGH (ref 70–99)
Glucose-Capillary: 55 mg/dL — ABNORMAL LOW (ref 70–99)
Glucose-Capillary: 94 mg/dL (ref 70–99)

## 2013-08-19 LAB — TROPONIN I

## 2013-08-19 MED ORDER — SODIUM CHLORIDE 0.9 % IJ SOLN
3.0000 mL | Freq: Two times a day (BID) | INTRAMUSCULAR | Status: DC
  Administered 2013-08-20: 11:00:00 3 mL via INTRAVENOUS

## 2013-08-19 MED ORDER — INSULIN ASPART 100 UNIT/ML ~~LOC~~ SOLN: 0.0000 [IU] | Freq: Three times a day (TID) | SUBCUTANEOUS | Status: DC

## 2013-08-19 MED ORDER — INSULIN NPH (HUMAN) (ISOPHANE) 100 UNIT/ML ~~LOC~~ SUSP
60.0000 [IU] | Freq: Every day | SUBCUTANEOUS | Status: DC
  Administered 2013-08-20: 11:00:00 60 [IU] via SUBCUTANEOUS
  Filled 2013-08-19: qty 10

## 2013-08-19 NOTE — Progress Notes (Signed)
SUBJECTIVE:  He is still very weak.  No chest pain.     PHYSICAL EXAM Filed Vitals:   08/18/13 1900 08/18/13 2000 08/19/13 0149 08/19/13 0709  BP: 122/41 114/45 100/33 105/45  Pulse:  64  66  Temp:  97.8 F (36.6 C) 98 F (36.7 C) 97.7 F (36.5 C)  TempSrc:  Oral Oral Oral  Resp: 12 18 18 20   Height:  5\' 1"  (1.549 m)    Weight:  219 lb 3.2 oz (99.428 kg)  221 lb (100.245 kg)  SpO2:  100% 98% 100%   General:  No distress Lungs:  Clear Heart:  RRR Abdomen:  Positive bowel sounds, no rebound no guarding Extremities:  Mild tense edema  LABS: Lab Results  Component Value Date   TROPONINI <0.30 08/19/2013   Results for orders placed during the hospital encounter of 08/18/13 (from the past 24 hour(s))  URINALYSIS, ROUTINE W REFLEX MICROSCOPIC     Status: None   Collection Time    08/18/13 12:17 PM      Result Value Range   Color, Urine YELLOW  YELLOW   APPearance CLEAR  CLEAR   Specific Gravity, Urine 1.005  1.005 - 1.030   pH 7.5  5.0 - 8.0   Glucose, UA NEGATIVE  NEGATIVE mg/dL   Hgb urine dipstick NEGATIVE  NEGATIVE   Bilirubin Urine NEGATIVE  NEGATIVE   Ketones, ur NEGATIVE  NEGATIVE mg/dL   Protein, ur NEGATIVE  NEGATIVE mg/dL   Urobilinogen, UA 0.2  0.0 - 1.0 mg/dL   Nitrite NEGATIVE  NEGATIVE   Leukocytes, UA NEGATIVE  NEGATIVE  CBC WITH DIFFERENTIAL     Status: Abnormal   Collection Time    08/18/13 12:32 PM      Result Value Range   WBC 9.4  4.0 - 10.5 K/uL   RBC 3.99 (*) 4.22 - 5.81 MIL/uL   Hemoglobin 11.3 (*) 13.0 - 17.0 g/dL   HCT 35.1 (*) 39.0 - 52.0 %   MCV 88.0  78.0 - 100.0 fL   MCH 28.3  26.0 - 34.0 pg   MCHC 32.2  30.0 - 36.0 g/dL   RDW 14.7  11.5 - 15.5 %   Platelets 207  150 - 400 K/uL   Neutrophils Relative % 65  43 - 77 %   Neutro Abs 6.1  1.7 - 7.7 K/uL   Lymphocytes Relative 19  12 - 46 %   Lymphs Abs 1.8  0.7 - 4.0 K/uL   Monocytes Relative 13 (*) 3 - 12 %   Monocytes Absolute 1.2 (*) 0.1 - 1.0 K/uL   Eosinophils Relative 3  0 - 5  %   Eosinophils Absolute 0.3  0.0 - 0.7 K/uL   Basophils Relative 0  0 - 1 %   Basophils Absolute 0.0  0.0 - 0.1 K/uL  COMPREHENSIVE METABOLIC PANEL     Status: Abnormal   Collection Time    08/18/13 12:32 PM      Result Value Range   Sodium 137  137 - 147 mEq/L   Potassium 4.4  3.7 - 5.3 mEq/L   Chloride 98  96 - 112 mEq/L   CO2 29  19 - 32 mEq/L   Glucose, Bld 60 (*) 70 - 99 mg/dL   BUN 25 (*) 6 - 23 mg/dL   Creatinine, Ser 1.38 (*) 0.50 - 1.35 mg/dL   Calcium 8.8  8.4 - 10.5 mg/dL   Total Protein 6.6  6.0 - 8.3 g/dL  Albumin 3.2 (*) 3.5 - 5.2 g/dL   AST 15  0 - 37 U/L   ALT 13  0 - 53 U/L   Alkaline Phosphatase 92  39 - 117 U/L   Total Bilirubin 0.3  0.3 - 1.2 mg/dL   GFR calc non Af Amer 51 (*) >90 mL/min   GFR calc Af Amer 59 (*) >90 mL/min  TROPONIN I     Status: None   Collection Time    08/18/13 12:32 PM      Result Value Range   Troponin I <0.30  <0.30 ng/mL  PRO B NATRIURETIC PEPTIDE     Status: Abnormal   Collection Time    08/18/13 12:32 PM      Result Value Range   Pro B Natriuretic peptide (BNP) 608.1 (*) 0 - 125 pg/mL  GLUCOSE, CAPILLARY     Status: None   Collection Time    08/18/13  2:01 PM      Result Value Range   Glucose-Capillary 75  70 - 99 mg/dL  TROPONIN I     Status: None   Collection Time    08/18/13  8:25 PM      Result Value Range   Troponin I <0.30  <0.30 ng/mL  GLUCOSE, CAPILLARY     Status: Abnormal   Collection Time    08/18/13  8:58 PM      Result Value Range   Glucose-Capillary 209 (*) 70 - 99 mg/dL   Comment 1 Notify RN     Comment 2 Documented in Chart    TROPONIN I     Status: None   Collection Time    08/19/13  2:05 AM      Result Value Range   Troponin I <0.30  <0.30 ng/mL  COMPREHENSIVE METABOLIC PANEL     Status: Abnormal   Collection Time    08/19/13  2:15 AM      Result Value Range   Sodium 137  137 - 147 mEq/L   Potassium 4.2  3.7 - 5.3 mEq/L   Chloride 99  96 - 112 mEq/L   CO2 28  19 - 32 mEq/L   Glucose,  Bld 146 (*) 70 - 99 mg/dL   BUN 22  6 - 23 mg/dL   Creatinine, Ser 1.36 (*) 0.50 - 1.35 mg/dL   Calcium 8.3 (*) 8.4 - 10.5 mg/dL   Total Protein 6.4  6.0 - 8.3 g/dL   Albumin 3.0 (*) 3.5 - 5.2 g/dL   AST 13  0 - 37 U/L   ALT 13  0 - 53 U/L   Alkaline Phosphatase 92  39 - 117 U/L   Total Bilirubin 0.2 (*) 0.3 - 1.2 mg/dL   GFR calc non Af Amer 52 (*) >90 mL/min   GFR calc Af Amer 60 (*) >90 mL/min  GLUCOSE, CAPILLARY     Status: None   Collection Time    08/19/13  5:57 AM      Result Value Range   Glucose-Capillary 95  70 - 99 mg/dL    Intake/Output Summary (Last 24 hours) at 08/19/13 0825 Last data filed at 08/19/13 0700  Gross per 24 hour  Intake 978.33 ml  Output    717 ml  Net 261.33 ml    ASSESSMENT AND PLAN:  HYPOTENSION: BP is up but he is very weak still.  Minimal ambulation in the room  CKD:  Creat still slightly elevated.    CHRONIC SYSTOLIC HF:  No  crackles. Continue with gentle hydration PO and IV.    Probably home in the AM.     Minus Breeding 08/19/2013 8:25 AM  \

## 2013-08-19 NOTE — Progress Notes (Signed)
[facility]  Patient Status: Active (receiving services up to time of hospitalization)  [facility] is providing the following services: RN and PT  If patient discharges after hours, please call [phone].   Mark French 08/19/2013, 10:40 AM

## 2013-08-19 NOTE — Care Management Note (Signed)
    Page 1 of 2   08/19/2013     11:12:26 AM   CARE MANAGEMENT NOTE 08/19/2013  Patient:  Mark French, Mark French   Account Number:  [id number]  Date Initiated:  08/19/2013  Documentation initiated by:  [facility]  Subjective/Objective Assessment:   Pt presented to ED with Hypostension and fatigue patient had a pacer placed a week  ago for heart block. He states PT was at his house today and found him to be hypotensive with pressures in the 80s./ home with wife Mark French [phone])     Action/Plan:   gentle hydration//resumption of services, speak to wife r/t insurance concerns   Anticipated DC Date:  08/20/2013   Anticipated DC Plan:  [place]  CM consult      [facility] Choice  HOME HEALTH  Resumption Of Svcs/PTA Provider   Choice offered to / List presented to:          [facility] arranged  HH-1 RN  [place]      [place].   Status of service:   Medicare Important Message given?   (If response is "NO", the following Medicare IM given date fields will be blank) Date Medicare IM given:   Date Additional Medicare IM given:    Discharge Disposition:    Per UR Regulation:    If discussed at Long Length of Stay Meetings, dates discussed:    Comments:  08/19/13 [place], RN, BSN, [employer] [phone] Spoke with pt and wife Mark French) at bedside regarding insurance concerns.  Wife concerned about bill because pt last admission was for observation and insurance did not cover medications administered.  Pt currently in observation...  Pt has [facility] Patient Status: Active (receiving services up to time of hospitalization) [facility] is providing the following services: RN and PT; please order resumption of services.  Thank you!

## 2013-08-19 NOTE — Progress Notes (Signed)
Pt o4x, pt states chronic pain, prn given. Pt states no CP or sob. Will continue to monitor

## 2013-08-19 NOTE — Progress Notes (Signed)
Hypoglycemic Event  CBG: 55  Treatment: 15 GM carbohydrate snack  Symptoms: None  Follow-up CBG: Time: 1721 CBG Result:95  Possible Reasons for Event: Unknown  Comments/MD notified: Pa paged, Nursing been checking Pt ACHS, but no orders in epic. Request CBG order from PA. Pt states he drops at home on occasion     Fredderick Phenix, Michael [place]  Remember to initiate Hypoglycemia Order Set & complete

## 2013-08-19 NOTE — Progress Notes (Signed)
Called by nurse, pt's glucose down to 55, they have treated with juice and food.  I decreased 70/30 to 60 units for hospitalization.

## 2013-08-19 NOTE — Progress Notes (Signed)
Clinical Education officer, museum (CSW) received consult for medication concerns. RN case manager met with patient and wife. Please reconsult if further social work needs arise. CSW signing off.   Blima Rich, [place] Weekend CSW [phone]

## 2013-08-20 ENCOUNTER — Other Ambulatory Visit: Payer: Self-pay | Admitting: Physician Assistant

## 2013-08-20 ENCOUNTER — Encounter (HOSPITAL_COMMUNITY): Payer: Self-pay | Admitting: Physician Assistant

## 2013-08-20 ENCOUNTER — Telehealth: Payer: Self-pay | Admitting: Cardiovascular Disease

## 2013-08-20 ENCOUNTER — Other Ambulatory Visit: Payer: Self-pay | Admitting: Cardiovascular Disease

## 2013-08-20 DIAGNOSIS — I509 Heart failure, unspecified: Secondary | ICD-10-CM

## 2013-08-20 LAB — BASIC METABOLIC PANEL
BUN: 17 mg/dL (ref 6–23)
CHLORIDE: 98 meq/L (ref 96–112)
CO2: 26 mEq/L (ref 19–32)
CREATININE: 1.17 mg/dL (ref 0.50–1.35)
Calcium: 8.7 mg/dL (ref 8.4–10.5)
GFR, EST AFRICAN AMERICAN: 72 mL/min — AB (ref >90)
GFR, EST NON AFRICAN AMERICAN: 62 mL/min — AB (ref >90)
Glucose, Bld: 90 mg/dL (ref 70–99)
Potassium: 4.1 mEq/L (ref 3.7–5.3)
Sodium: 135 mEq/L — ABNORMAL LOW (ref 137–147)

## 2013-08-20 LAB — GLUCOSE, CAPILLARY: Glucose-Capillary: 101 mg/dL — ABNORMAL HIGH (ref 70–99)

## 2013-08-20 MED ORDER — CARVEDILOL 3.125 MG PO TABS: 3.1250 mg | ORAL_TABLET | Freq: Two times a day (BID) | ORAL | Status: DC

## 2013-08-20 MED ORDER — LOSARTAN POTASSIUM 50 MG PO TABS: 25.0000 mg | ORAL_TABLET | Freq: Every day | ORAL | Status: DC

## 2013-08-20 NOTE — Telephone Encounter (Signed)
New message   Per Katie PA    7 DAYS TCM with Estella Husk on 1/21 @ 8:45 .

## 2013-08-20 NOTE — Discharge Instructions (Signed)
Get labwork (BMET) on Monday at lab of your choice.

## 2013-08-20 NOTE — Discharge Summary (Signed)
Discharge Summary   Patient ID: Mark French MRN: [id number], DOB/AGE: October 01, 1945 68 y.o. Admit date: 08/18/2013 D/C date:     08/20/2013  Primary Cardiologist: Dr. Burt Knack  Principal Problem:   Hypotension Active Problems:   Chronic systolic heart failure   RENAL INSUFFICIENCY   Hospital Course: Mark CINQUEMANI is a 68 y.o. male with a history of obesity, chronic systolic HF with EF of 19%, CVA, PVD, CKD, DM, complete heart block s/p pacemaker placement and CAD who was re-admitted on 08/18/13 for weakness and hypotension. He was hospitalized 01/01-01/01/2014 with SOB and chest pain. He had a cardiac catheterization on this admission which showed severe native 3 vessel disease with LIMA-LAD and SVG-Diag patent, CFX occluded and RCA with severe diffuse proximal and distal disease. The SVG-OM and SVG-RCA are known to be occluded. Medical therapy was recommended. He was also found to be in complete heart block and a temporary PPM was placed. After he was otherwise medically stabilized, he had a St Jude BiV PPM placed, on 01/02. His EF was 40% by echo at this time. The patient required diuresis and his d/c weight was 103.15 kg. After discharge he did well and had no problems with the PPM site. He was visited by home health PT on 1/12 and his SBP was noted to be 80s. He also reported weakness to PT. He then proceeded to the ED where his BUN/Cr were found to be elevated and his weight 4 kg less than at discharge 5 days prior.  It was felt that the patient had been over diuresed; his diuretics were held and he was admitted to telemetry and given gentle hydration. He gained 1.69 kg and is up 884.7 mL. His discharge weight it 101.7 kgs.  Dr. Percival Spanish has evaluated the patient today and deemed him stable for discharge home. Discharge meds include 20 mg Torsemide daily, 3.125 Coreg and 25 qhs of Cozaar. Spironolactone was discontinued. No other changes were made and he is to resume all other home  medications. A BMET for next Monday and a 7 day transition of care appointment have been scheduled.    Discharge Vitals: Blood pressure 144/49, pulse 64, temperature 97.3 F (36.3 C), temperature source Oral, resp. rate 18, height 5\' 1"  (1.549 m), weight 222 lb 14.4 oz (101.107 kg), SpO2 100.00%.  Labs: Lab Results  Component Value Date   WBC 9.4 08/18/2013   HGB 11.3* 08/18/2013   HCT 35.1* 08/18/2013   MCV 88.0 08/18/2013   PLT 207 08/18/2013     Recent Labs Lab 08/19/13 0215 08/20/13 0300  NA 137 135*  K 4.2 4.1  CL 99 98  CO2 28 26  BUN 22 17  CREATININE 1.36* 1.17  CALCIUM 8.3* 8.7  PROT 6.4  --   BILITOT 0.2*  --   ALKPHOS 92  --   ALT 13  --   AST 13  --   GLUCOSE 146* 90    Recent Labs  08/18/13 1232 08/18/13 2025 08/19/13 0205 08/19/13 0741  TROPONINI <0.30 <0.30 <0.30 <0.30    Diagnostic Studies/Procedures   Echo: 08/09/2013  Study Conclusions - Left ventricle: The cavity size was normal. The estimated ejection fraction was 40%. There is severe hypokinesis of the entire inferior myocardium. There was an increased relative contribution of atrial contraction to ventricular filling. Doppler parameters are consistent with abnormal left ventricular relaxation (grade 1 diastolic dysfunction). - Ventricular septum: Septal motion showed paradox. - Mitral valve: Mild regurgitation. - Left  atrium: The atrium was mildly dilated. - Atrial septum: No defect or patent foramen ovale was identified. - Pulmonary arteries: PA peak pressure: 56mm Hg (S). Impressions: - The right ventricular systolic pressure was increased consistent with moderate pulmonary hypertension.  Cardiac Cath: 08/07/2013  Temporary Transvenous Pacemaker Placement:  A 5 Fr Transvenous Pacer Wire was advanced through the Venous Sheath under fluoroscopic guidance, and with with balloon inflated advanced into the Right Ventricular Apex. The wires were connected to the pacing computer.  Thresholds were measured & settings were set at 5 mAMP, rate of 70 bpm. TTVP distance ~65-67 mm.  Coronary Anatomy:  Left Main: Moderate caliber vessel that bifurcates into the LAD & Circumflex; angiographically normal LAD: Patent proximally with large proximal septal trunk (with faint L-R collaterals to RPDA) followed by extensive calcification and 100% mid occlusion. Small proximal Diag.  D2: Perfused by patent SVG Left Circumflex: 100% proximal occlusion after Very small marginal branch.  RCA: Small to moderate caliber, dominant vessel with severe diffuse proximal and distal disease, yet TIMI 1 flow to the small caliber PDA.  Graft Anatomy:  LIMA-LAD: widely patent to a diminutive target LAD that wraps the apex; retrograde flow to a large septal perforator  SVG-Diag: widely patent with ~30-40% "hinge-point" in mid vessel; stent is widely patent.  SVG-OM & SVG-RCA: known occlusion.   Dg Chest 2 View  08/18/2013   CLINICAL DATA:  Shortness of breath.  EXAM: CHEST  2 VIEW  COMPARISON:  August 09, 2013.  FINDINGS: Stable cardiomediastinal silhouette. Status post coronary artery bypass graft. Left-sided pacemaker is unchanged in position. No pneumothorax or pleural effusion is noted. Degenerative changes of lower thoracic spine are noted. Linear density is noted in lingular region consistent with subsegmental atelectasis. Right lung is clear.  IMPRESSION: Probable subsegmental atelectasis seen in lingular region. Stable chronic findings as described above.       Discharge Medications     Medication List    STOP taking these medications       spironolactone 25 MG tablet  Commonly known as:  ALDACTONE      TAKE these medications       albuterol 108 (90 BASE) MCG/ACT inhaler  Commonly known as:  PROVENTIL HFA;VENTOLIN HFA  Inhale 2 puffs into the lungs every 6 (six) hours as needed for wheezing or shortness of breath.     albuterol (2.5 MG/3ML) 0.083% nebulizer solution  Commonly known  as:  PROVENTIL  Take 2.5 mg by nebulization 2 (two) times daily as needed for wheezing or shortness of breath.     aspirin EC 81 MG tablet  Take 81 mg by mouth at bedtime.     atorvastatin 40 MG tablet  Commonly known as:  LIPITOR  Take 40 mg by mouth at bedtime.     budesonide 0.25 MG/2ML nebulizer solution  Commonly known as:  PULMICORT  Take 0.25 mg by nebulization daily as needed (shortness of breath).     carvedilol 3.125 MG tablet  Commonly known as:  COREG  Take 1 tablet (3.125 mg total) by mouth 2 (two) times daily with a meal.     clopidogrel 75 MG tablet  Commonly known as:  PLAVIX  Take 75 mg by mouth daily with breakfast.     co-enzyme Q-10 30 MG capsule  Take 30 mg by mouth daily at 2 PM daily at 2 PM.     docusate sodium 100 MG capsule  Commonly known as:  COLACE  Take 100 mg by  mouth 2 (two) times daily as needed for constipation.     gabapentin 600 MG tablet  Commonly known as:  NEURONTIN  Take 1 tablet (600 mg total) by mouth 3 (three) times daily.     HYDROcodone-acetaminophen 10-325 MG per tablet  Commonly known as:  NORCO  Take 1-2 tablets by mouth every 4 (four) hours as needed for moderate pain.     insulin NPH Human 100 UNIT/ML injection  Commonly known as:  HUMULIN N,NOVOLIN N  Inject 70 Units into the skin daily before breakfast.     ipratropium 0.02 % nebulizer solution  Commonly known as:  ATROVENT  Take 500 mcg by nebulization every 6 (six) hours as needed for wheezing or shortness of breath.     isosorbide mononitrate 30 MG 24 hr tablet  Commonly known as:  IMDUR  Take 30 mg by mouth daily.     losartan 50 MG tablet  Commonly known as:  COZAAR  Take 0.5 tablets (25 mg total) by mouth daily.     nitroGLYCERIN 0.4 MG SL tablet  Commonly known as:  NITROSTAT  Place 1 tablet (0.4 mg total) under the tongue every 5 (five) minutes as needed for chest pain.     OVER THE COUNTER MEDICATION  Apply 1 application topically daily as needed.  Anti fungal cream     OVER THE COUNTER MEDICATION  Place 1 drop into both eyes daily as needed (dry eyes). [facility] eye drops     pantoprazole 40 MG tablet  Commonly known as:  PROTONIX  Take 1 tablet (40 mg total) by mouth daily.     potassium chloride SA 20 MEQ tablet  Commonly known as:  K-DUR,KLOR-CON  Take 20 mEq by mouth 2 (two) times daily.     RAPAFLO 8 MG Caps capsule  Generic drug:  silodosin  Take 8 mg by mouth at bedtime.     torsemide 20 MG tablet  Commonly known as:  DEMADEX  Take 60 mg by mouth 2 (two) times daily. Morning and mid afternoon        Disposition   The patient will be discharged in stable condition to home. Discharge Orders   Future Appointments Provider Department Dept Phone   08/21/2013 8:00 AM [name], MD [facility] [phone]   08/25/2013 8:00 AM [facility] [place] [facility] [phone]   08/27/2013 8:45 AM Imogene Burn, PA-C [facility] [phone]   Future Orders Complete By Expires   Diet - low sodium heart healthy  As directed    Increase activity slowly  As directed      Follow-up Information   Follow up with [facility].   Contact information:   [place] [address] [phone]       Follow up with Ermalinda Barrios, PA-C On 08/27/2013. (8:45am)    Specialty:  Cardiology   Contact information:   [address], [place] [address] [place] [address] [phone]         Duration of Discharge Encounter: Greater than 30 minutes including physician and PA time.  SignedVertell Limber, KATHRYN PA-C 08/20/2013, 1:17 PM   Patient seen and examined.  Plan as discussed in my rounding note for today and outlined above. Minus Breeding  08/20/2013  5:40 PM

## 2013-08-20 NOTE — Progress Notes (Signed)
SUBJECTIVE:  No chest pain. Ambulated yesterday   PHYSICAL EXAM Filed Vitals:   08/19/13 1410 08/19/13 2122 08/20/13 0150 08/20/13 0504  BP: 131/59 127/51 110/54 130/52  Pulse: 67 66 63 64  Temp: 97.5 F (36.4 C) 97.2 F (36.2 C) 97.4 F (36.3 C) 97.3 F (36.3 C)  TempSrc: Oral Oral Oral Oral  Resp: 19 20 18 18   Height:      Weight:    222 lb 14.4 oz (101.107 kg)  SpO2: 100% 100% 100% 100%   General:  No distress Lungs:  Clear Heart:  RRR Abdomen:  Positive bowel sounds, no rebound no guarding Extremities:  Mild tense edema  LABS:  Results for orders placed during the hospital encounter of 08/18/13 (from the past 24 hour(s))  GLUCOSE, CAPILLARY     Status: Abnormal   Collection Time    08/19/13 11:30 AM      Result Value Range   Glucose-Capillary 114 (*) 70 - 99 mg/dL   Comment 1 Documented in Chart     Comment 2 Notify RN    GLUCOSE, CAPILLARY     Status: Abnormal   Collection Time    08/19/13  4:54 PM      Result Value Range   Glucose-Capillary 55 (*) 70 - 99 mg/dL   Comment 1 Documented in Chart     Comment 2 Notify RN    GLUCOSE, CAPILLARY     Status: None   Collection Time    08/19/13  5:20 PM      Result Value Range   Glucose-Capillary 95  70 - 99 mg/dL  GLUCOSE, CAPILLARY     Status: None   Collection Time    08/19/13  9:18 PM      Result Value Range   Glucose-Capillary 94  70 - 99 mg/dL   Comment 1 Notify RN     Comment 2 Documented in Chart    BASIC METABOLIC PANEL     Status: Abnormal   Collection Time    08/20/13  3:00 AM      Result Value Range   Sodium 135 (*) 137 - 147 mEq/L   Potassium 4.1  3.7 - 5.3 mEq/L   Chloride 98  96 - 112 mEq/L   CO2 26  19 - 32 mEq/L   Glucose, Bld 90  70 - 99 mg/dL   BUN 17  6 - 23 mg/dL   Creatinine, Ser 1.17  0.50 - 1.35 mg/dL   Calcium 8.7  8.4 - 10.5 mg/dL   GFR calc non Af Amer 62 (*) >90 mL/min   GFR calc Af Amer 72 (*) >90 mL/min  GLUCOSE, CAPILLARY     Status: Abnormal   Collection Time   08/20/13  5:47 AM      Result Value Range   Glucose-Capillary 101 (*) 70 - 99 mg/dL    Intake/Output Summary (Last 24 hours) at 08/20/13 0817 Last data filed at 08/20/13 0600  Gross per 24 hour  Intake 2683.33 ml  Output   2300 ml  Net 383.33 ml    ASSESSMENT AND PLAN:  HYPOTENSION: BP is improved.  OK to go home.   CKD:  Creat is back down  CHRONIC SYSTOLIC HF:  No crackles.    Home today.   Discharge meds might include 20 mg Torsemide daily, no spironolactone, 3.125 Coreg and 25 qhs of Cozaar.  (As well as the other meds on his MAR). He needs BMET on Monday with a  TOC appt within 7 days.    Jeneen Rinks [facility] 08/20/2013 8:17 AM  \

## 2013-08-20 NOTE — Progress Notes (Signed)
All d/c instructions explained and given by Mariann Laster.  Pt d/c off florr via own scooter and esscorted by wife.  Declined hospital escort.  Karie Kirks, Therapist, sports.

## 2013-08-21 ENCOUNTER — Encounter: Payer: Self-pay | Admitting: Endocrinology

## 2013-08-21 ENCOUNTER — Ambulatory Visit (INDEPENDENT_AMBULATORY_CARE_PROVIDER_SITE_OTHER): Payer: Medicare Other | Admitting: Endocrinology

## 2013-08-21 VITALS — BP 106/46 | HR 56 | Temp 97.9°F | Ht 61.0 in | Wt 223.0 lb

## 2013-08-21 DIAGNOSIS — H9190 Unspecified hearing loss, unspecified ear: Secondary | ICD-10-CM

## 2013-08-21 DIAGNOSIS — E1049 Type 1 diabetes mellitus with other diabetic neurological complication: Secondary | ICD-10-CM

## 2013-08-21 DIAGNOSIS — N259 Disorder resulting from impaired renal tubular function, unspecified: Secondary | ICD-10-CM

## 2013-08-21 DIAGNOSIS — Z125 Encounter for screening for malignant neoplasm of prostate: Secondary | ICD-10-CM

## 2013-08-21 DIAGNOSIS — Z0000 Encounter for general adult medical examination without abnormal findings: Secondary | ICD-10-CM

## 2013-08-21 LAB — PSA, MEDICARE: PSA: 0.18 ng/mL (ref 0.10–4.00)

## 2013-08-21 LAB — HEMOGLOBIN A1C: Hgb A1c MFr Bld: 7.2 % — ABNORMAL HIGH (ref 4.6–6.5)

## 2013-08-21 MED ORDER — HYDROCODONE-ACETAMINOPHEN 10-325 MG PO TABS: 1.0000 | ORAL_TABLET | ORAL | Status: DC | PRN
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
## 2013-08-21 NOTE — Patient Instructions (Addendum)
blood tests are being requested for you today.  We'll contact you with results. Please come back for a follow-up appointment in 2 weeks.   Refer to a hearing specialist.  you will receive a phone call, about a day and time for an appointment. A good diet significanly improve the control of your diabetes.  please let me know if you wish to be referred to a dietician.  high blood sugar is very risky to your health.  you should see an eye doctor every year.   please consider these measures for your health:  minimize alcohol.  do not use tobacco products.  have a colonoscopy at least every 10 years from age 67.  keep firearms safely stored.  always use seat belts.  have working smoke alarms in your home.  see an eye doctor and dentist regularly.  never drive under the influence of alcohol or drugs (including prescription drugs).  those with fair skin should take precautions against the sun. it is critically important to prevent falling down (keep floor areas well-lit, dry, and free of loose objects.  If you have a cane, walker, or wheelchair, you should use it, even for short trips around the house.  Also, try not to rush) You should have a vaccine against shingles (a painful rash which results from the  chickenpox infection which most people had many years ago).  This vaccine reduces, but does not totally eliminate the risk of shingles.  Because this is a medicare part d benefit, you should get it at a pharmacy.

## 2013-08-21 NOTE — Telephone Encounter (Signed)
TCM - LMTCB, regarding discharge from hospital on August 20, 2013.

## 2013-08-22 ENCOUNTER — Other Ambulatory Visit: Payer: Self-pay | Admitting: Endocrinology

## 2013-08-22 LAB — PTH, INTACT AND CALCIUM
CALCIUM: 8.6 mg/dL (ref 8.4–10.5)
PTH: 156.5 pg/mL — ABNORMAL HIGH (ref 14.0–72.0)

## 2013-08-22 MED ORDER — CALCITRIOL 0.25 MCG PO CAPS: 0.2500 ug | ORAL_CAPSULE | Freq: Every day | ORAL | Status: DC

## 2013-08-22 NOTE — Progress Notes (Signed)
I was called to assist with patient management in the Cath Lab. Mr. Schrack at that time just undergone coronary angiography with temporary transvenous pacemaker placement via the left femoral vein approach. After the procedure was completed patient developed significant respiratory distress, he is SpO2 was noted to be in need 80s despite 100% nonrebreather, patient was tachypneic 30-35 times a minute, blood pressure was 160/100 mmHg.  Brief physical exam at the moment of my evaluation: Vital signs as above General severe respiratory distress, patient disoriented, trying to sit up in the bed Cardiovascular: Paced rhythm, blood pressure 160/100 mmHg, unable to assess JVD as patient is laying flat, however he gets severely elevated LVDP noted during the heart catheterization. Peripheral edema 1+ Abdomen: Paradoxical abdominal movement with breathing, soft nontender Respiratory: Tachypnea using accessory muscles of breathing, bibasilar crackles  Impression: acute hypoxemic respiratory failure due to acute pulmonary edema   acute on chronic systolic congestive heart failure  During the period of providing service:  Good if patient 40 mg of Lasix IV, followed by 80 mg IV twice a day Called respiratory therapist for doing breathing treatments and start patient on BiPAP We use IV morphine for air hunger and agitation Nitroglycerin drip was initiated and titrated closely to bring patient's blood pressure below 120/70 no murmurs mercury Critical care medicine or called to evaluate the patient for possible intubation Dopamine drip was used for short period time to improve cardiac output  CPT code: 99291 - 60 minutes spent with the patient, stabilizing his acute respiratory failure while in the Cath Lab, discussing plan with critical care medicine attending and adjusting his cardiovascular medications after the transfer to CICU

## 2013-08-25 ENCOUNTER — Other Ambulatory Visit (INDEPENDENT_AMBULATORY_CARE_PROVIDER_SITE_OTHER): Payer: Medicare Other

## 2013-08-25 DIAGNOSIS — I5022 Chronic systolic (congestive) heart failure: Secondary | ICD-10-CM

## 2013-08-25 LAB — BASIC METABOLIC PANEL
BUN: 25 mg/dL — ABNORMAL HIGH (ref 6–23)
CHLORIDE: 98 meq/L (ref 96–112)
CO2: 30 mEq/L (ref 19–32)
CREATININE: 1.5 mg/dL (ref 0.4–1.5)
Calcium: 8.9 mg/dL (ref 8.4–10.5)
GFR: 47.71 mL/min — AB (ref >60.00)
Glucose, Bld: 126 mg/dL — ABNORMAL HIGH (ref 70–99)
POTASSIUM: 4 meq/L (ref 3.5–5.1)
Sodium: 136 mEq/L (ref 135–145)

## 2013-08-25 NOTE — Telephone Encounter (Signed)
TCM pt. Was d/c 08/20/13.  States he is doing good.  No c/o of pain or SOB. Is taking all of medication as prescribed.  States groin site clean and dry without edema. Is aware of appointment on 1/21 at 8:45 with Estella Husk.  Will call if has any questions.

## 2013-08-27 ENCOUNTER — Ambulatory Visit (INDEPENDENT_AMBULATORY_CARE_PROVIDER_SITE_OTHER): Payer: Medicare Other | Admitting: Physician Assistant

## 2013-08-27 ENCOUNTER — Other Ambulatory Visit: Payer: Medicare Other

## 2013-08-27 ENCOUNTER — Encounter: Payer: Self-pay | Admitting: Physician Assistant

## 2013-08-27 ENCOUNTER — Ambulatory Visit (INDEPENDENT_AMBULATORY_CARE_PROVIDER_SITE_OTHER): Payer: Medicare Other | Admitting: *Deleted

## 2013-08-27 VITALS — BP 113/58 | HR 62 | Ht 61.0 in | Wt 228.0 lb

## 2013-08-27 DIAGNOSIS — N259 Disorder resulting from impaired renal tubular function, unspecified: Secondary | ICD-10-CM

## 2013-08-27 DIAGNOSIS — I442 Atrioventricular block, complete: Secondary | ICD-10-CM

## 2013-08-27 DIAGNOSIS — Z950 Presence of cardiac pacemaker: Secondary | ICD-10-CM

## 2013-08-27 DIAGNOSIS — I5023 Acute on chronic systolic (congestive) heart failure: Secondary | ICD-10-CM

## 2013-08-27 DIAGNOSIS — R609 Edema, unspecified: Secondary | ICD-10-CM

## 2013-08-27 DIAGNOSIS — E7800 Pure hypercholesterolemia, unspecified: Secondary | ICD-10-CM

## 2013-08-27 DIAGNOSIS — I25810 Atherosclerosis of coronary artery bypass graft(s) without angina pectoris: Secondary | ICD-10-CM

## 2013-08-27 DIAGNOSIS — I5022 Chronic systolic (congestive) heart failure: Secondary | ICD-10-CM

## 2013-08-27 LAB — MDC_IDC_ENUM_SESS_TYPE_INCLINIC
Brady Statistic RA Percent Paced: 53 %
Brady Statistic RV Percent Paced: 99.3 %
Date Time Interrogation Session: 20150121095606
Implantable Pulse Generator Serial Number: 7555472
Lead Channel Impedance Value: 512.5 Ohm
Lead Channel Impedance Value: 550 Ohm
Lead Channel Impedance Value: 800 Ohm
Lead Channel Pacing Threshold Amplitude: 0.75 V
Lead Channel Pacing Threshold Amplitude: 1.25 V
Lead Channel Pacing Threshold Amplitude: 1.25 V
Lead Channel Pacing Threshold Pulse Width: 0.5 ms
Lead Channel Sensing Intrinsic Amplitude: 5 mV
Lead Channel Setting Pacing Amplitude: 3.5 V
Lead Channel Setting Pacing Amplitude: 3.5 V
Lead Channel Setting Pacing Pulse Width: 0.5 ms
Lead Channel Setting Pacing Pulse Width: 0.5 ms
MDC IDC MSMT BATTERY REMAINING LONGEVITY: 50.4 mo
MDC IDC MSMT BATTERY VOLTAGE: 2.99 V
MDC IDC MSMT LEADCHNL LV PACING THRESHOLD PULSEWIDTH: 0.5 ms
MDC IDC MSMT LEADCHNL LV PACING THRESHOLD PULSEWIDTH: 0.5 ms
MDC IDC MSMT LEADCHNL RA PACING THRESHOLD AMPLITUDE: 0.5 V
MDC IDC MSMT LEADCHNL RA PACING THRESHOLD AMPLITUDE: 0.5 V
MDC IDC MSMT LEADCHNL RA PACING THRESHOLD PULSEWIDTH: 0.5 ms
MDC IDC MSMT LEADCHNL RA PACING THRESHOLD PULSEWIDTH: 0.5 ms
MDC IDC MSMT LEADCHNL RV PACING THRESHOLD AMPLITUDE: 0.75 V
MDC IDC MSMT LEADCHNL RV PACING THRESHOLD PULSEWIDTH: 0.5 ms
MDC IDC PG MODEL: 3242
MDC IDC SET LEADCHNL LV PACING AMPLITUDE: 3.5 V
MDC IDC SET LEADCHNL RV SENSING SENSITIVITY: 5 mV

## 2013-08-27 NOTE — Patient Instructions (Signed)
Your physician recommends that you continue on your current medications as directed. Please refer to the Current Medication list given to you today.  Your physician recommends that you return for lab work on: 09/01/2013, BMET  Your physician recommends that you schedule a follow-up appointment in: 2 months with Dr. Burt Knack

## 2013-08-27 NOTE — Assessment & Plan Note (Addendum)
Patient underwent St. Jude BIV permanent pacemaker on 08/08/12 for complete heart block. Pacer site healing well. For pacer check today in pacer clinic.

## 2013-08-27 NOTE — Addendum Note (Signed)
Addended by: Phineas Inches D on: 08/27/2013 09:29 AM   Modules accepted: Orders

## 2013-08-27 NOTE — Assessment & Plan Note (Signed)
Chronic lower extremity edema. Recommend TED hose. They do have them he just hasn't been wearing them. Keep legs elevated during the day.

## 2013-08-27 NOTE — Assessment & Plan Note (Addendum)
Recent cath with description above. Medical therapy recommended. No further chest pain. Continue current treatment.

## 2013-08-27 NOTE — Progress Notes (Signed)
HPI:    Mark French is a 68 y.o. male with a history of obesity, chronic systolic HF with EF of [id number], CVA, PVD, CKD, DM, complete heart block s/p pacemaker placement and CAD who was re-admitted on 08/18/13 for weakness and hypotension. He was hospitalized 01/01-01/01/2014 with SOB and chest pain. He had a cardiac catheterization on that admission which showed severe native 3 vessel disease with LIMA-LAD and SVG-Diag patent, CFX occluded and RCA with severe diffuse proximal and distal disease. The SVG-OM and SVG-RCA are known to be occluded. Medical therapy was recommended. He was also found to be in complete heart block and a temporary PPM was placed. After he was otherwise medically stabilized, he had a St Jude BiV PPM placed, on 01/02. His EF was 40% by echo at this time. The patient required diuresis and his d/c weight was 103.15 kg. After discharge he did well and had no problems with the PPM site. He was visited by home health PT on 1/12 and his SBP was noted to be 80s. He also reported weakness to PT. He then proceeded to the ED where his BUN/Cr were found to be elevated and his weight 4 kg less than at discharge 5 days prior.   It was felt that the patient had been over diuresed; his diuretics were held and he was admitted to telemetry and given gentle hydration. He gained 1.69 kg and is up 884.7 mL. His discharge weight it 101.7 kgs.  Overall the patient is doing quite well since his second discharge from the hospital. He is weighing himself daily and it has been stable. He denies any chest pain, palpitations, dyspnea, dyspnea on exertion, dizziness, or presyncope. Blood pressures been stable. He had blood work on Monday and his creatinine was 1.5. This is still up a little. His blood pressures been much better and he's not had any further hypotension.   Allergies:  -- Sildenafil -- Nausea Only and Other (See                           Comments)   --  "took it once; heart went to pieces; never  took it            again"  -- Percocet [Oxycodone-Acetaminophen] -- Other (See Comments)   --  Unknown reaction-possible altered mental state-per            wife.  -- Contrast Media [Iodinated Diagnostic Agents] -- Nausea Only  Current Outpatient Prescriptions on File Prior to Visit: albuterol (PROVENTIL HFA;VENTOLIN HFA) 108 (90 BASE) MCG/ACT inhaler, Inhale 2 puffs into the lungs every 6 (six) hours as needed for wheezing or shortness of breath. , Disp: , Rfl:  albuterol (PROVENTIL) (2.5 MG/3ML) 0.083% nebulizer solution, Take 2.5 mg by nebulization 2 (two) times daily as needed for wheezing or shortness of breath. , Disp: , Rfl:  aspirin EC 81 MG tablet, Take 81 mg by mouth at bedtime., Disp: , Rfl:  atorvastatin (LIPITOR) 40 MG tablet, Take 40 mg by mouth at bedtime. , Disp: , Rfl:  budesonide (PULMICORT) 0.25 MG/2ML nebulizer solution, Take 0.25 mg by nebulization daily as needed (shortness of breath). , Disp: , Rfl:  calcitRIOL (ROCALTROL) 0.25 MCG capsule, Take 1 capsule (0.25 mcg total) by mouth daily., Disp: 30 capsule, Rfl: 11 carvedilol (COREG) 3.125 MG tablet, Take 1 tablet (3.125 mg total) by mouth 2 (two) times daily with a meal., Disp: 60 tablet, Rfl: 5 clopidogrel (  PLAVIX) 75 MG tablet, Take 75 mg by mouth daily with breakfast., Disp: , Rfl:  co-enzyme Q-10 30 MG capsule, Take 30 mg by mouth daily at 2 PM daily at 2 PM. , Disp: , Rfl:  docusate sodium (COLACE) 100 MG capsule, Take 100 mg by mouth 2 (two) times daily as needed for constipation., Disp: , Rfl:  gabapentin (NEURONTIN) 600 MG tablet, Take 1 tablet (600 mg total) by mouth 3 (three) times daily., Disp: 90 tablet, Rfl: 3 HYDROcodone-acetaminophen (NORCO) 10-325 MG per tablet, Take 1-2 tablets by mouth every 4 (four) hours as needed for moderate pain., Disp: 120 tablet, Rfl: 0 insulin NPH (HUMULIN N,NOVOLIN N) 100 UNIT/ML injection, Inject 70 Units into the skin daily before breakfast., Disp: 2 vial, Rfl: 2 ipratropium  (ATROVENT) 0.02 % nebulizer solution, Take 500 mcg by nebulization every 6 (six) hours as needed for wheezing or shortness of breath. , Disp: , Rfl:  isosorbide mononitrate (IMDUR) 30 MG 24 hr tablet, Take 30 mg by mouth daily., Disp: , Rfl:  isosorbide mononitrate (IMDUR) 30 MG 24 hr tablet, TAKE 1 TABLET BY MOUTH EVERY DAY, Disp: 30 tablet, Rfl: 0 losartan (COZAAR) 50 MG tablet, Take 0.5 tablets (25 mg total) by mouth daily., Disp: 30 tablet, Rfl: 5 nitroGLYCERIN (NITROSTAT) 0.4 MG SL tablet, Place 1 tablet (0.4 mg total) under the tongue every 5 (five) minutes as needed for chest pain., Disp: 25 tablet, Rfl: 5 OVER THE COUNTER MEDICATION, Apply 1 application topically daily as needed. Anti fungal cream, Disp: , Rfl:  OVER THE COUNTER MEDICATION, Place 1 drop into both eyes daily as needed (dry eyes). [facility] eye drops, Disp: , Rfl:  pantoprazole (PROTONIX) 40 MG tablet, Take 1 tablet (40 mg total) by mouth daily., Disp: 30 tablet, Rfl: 3 potassium chloride SA (K-DUR,KLOR-CON) 20 MEQ tablet, Take 20 mEq by mouth 2 (two) times daily., Disp: , Rfl:  silodosin (RAPAFLO) 8 MG CAPS capsule, Take 8 mg by mouth at bedtime. , Disp: , Rfl:  torsemide (DEMADEX) 20 MG tablet, Take 60 mg by mouth 2 (two) times daily. Morning and mid afternoon, Disp: , Rfl:   No current facility-administered medications on file prior to visit.   Past Medical History:   CORONARY ARTERY DISEASE                                        Comment:a. s/p CABG 1998. b.  08/2011: inferior STEMI.                LHC 09/07/11: Severe 3v CAD, LIMA-LAD patent,               SVG-RCA chronically occluded, SVG-circumflex               chronically occluded, SVG-diagonal 95%. PCI:               Promus DES to the SVG-diagonal.  Procedure c/b               transient CHB req CPR and TTVP. c. Myoview               11/2011: scar but no ischemia.    DIABETES MELLITUS, TYPE I                                    HYPERTENSION  Proliferative diabetic retinopathy(362.02)                   HYPERCHOLESTEROLEMIA                                         CERVICAL RADICULOPATHY, LEFT                                 PERIPHERAL NEUROPATHY                                        Chronic systolic heart failure                                 Comment:Echocardiogram 08/09/13:Cavity size normal, EF               40%, inferior hypokinesis, grade 1 diastolic               dysfunction, paradoxical septal motion, mild               MR, mild LAE, no PFO, PAS 51, moderate pulm HTN   ED (erectile dysfunction)                                    Hypogonadism male                                            GI bleed                                        2012           Comment:Multiple Duodenal ulcer    Obesity                                                      Legally blind                                                  Comment:bilaterally   Stroke                                          1945           Comment:age 29 months; residual right sided weakness   History of blood transfusion                    2012         Charcot foot due to diabetes mellitus  Comment:chronic pain   Osteoarthritis                                                 Comment:right hip and shoulder   COPD (chronic obstructive pulmonary disease)                 PERIPHERAL VASCULAR INSUFFICIENCY,  LEGS, BILA* 08/17/2010    Hiatal hernia                                                Chronic stasis dermatitis                                      Comment:Chronic stasis changes.   CKD (chronic kidney disease), stage III                      Syncope                                                        Comment:a. 07/2013: suspected due to hypotension.   Biventricular cardiac pacemaker in situ                        Comment:a. [place] RF model               [id number] (serial number [id number])    Complete heart block                                           Comment:a. s/p pacemaker placement on 08/2013 admission  Past Surgical History:   CARPAL TUNNEL RELEASE                                           Comment:left   SIGMOIDOSCOPY                                    03/11/2001   Venous Doppler                                   01/30/2004   TONSILLECTOMY AND ADENOIDECTOMY                                 Comment:"as a kid"   CATARACT EXTRACTION W/ INTRAOCULAR LENS  IMPLA*               CORONARY ANGIOPLASTY WITH STENT PLACEMENT        08/2011         Comment:"1"  RETINOPATHY SURGERY                              2000's         Comment:"laser; both eyes"   WRIST FUSION                                     1976           Comment:right   ANTERIOR FUSION CERVICAL SPINE                   2010           Comment:"C spine; Dr. Ronnald Ramp"   [place]; ?da*     Comment:LEFT:  fused; removed bulk of hardware   HEEL SPUR SURGERY                                ~ 2007         Comment:? left   IRRIGATION AND DEBRIDEMENT KNEE                  02/27/2012      Comment:Procedure: IRRIGATION AND DEBRIDEMENT KNEE;                Surgeon: Augustin Schooling, MD;  Location: [facility];              Service: Orthopedics;  Laterality: Right;                irrigation and drainage right knee septic               bursitis   EYE SURGERY                                      2008           Comment:cataract removal, cautery   CORONARY ARTERY BYPASS GRAFT                     1998           Comment:CABG X5   FRACTURE SURGERY                                                Comment:foot broken 1976   PACEMAKER INSERTION                              08/08/2013     Comment:Biventricular pacemaker: [employer] RF model [id number] (serial number [id number])  Review of patient's family history indicates:   Heart disease                  Mother  Comment: Coronary Artery Disease   Heart disease                  Father                     Comment: Coronary Artery Disease   Heart disease                  Paternal Grandmother       Comment: Coronary Artery Disease   Colon cancer                   Neg Hx                   Diabetes                       Other                      Comment: Grandmother   Social History   Marital Status: Married             Spouse Name:                      Years of Education:                 Number of children: 0           Occupational History Occupation          Fish farm manager            Comment              Disabled                                  Social History Main Topics   Smoking Status: Never Smoker                     Smokeless Status: Never Used                       Alcohol Use: No             Drug Use: No             Sexual Activity: No                 Other Topics            Concern   None on file  Social History Narrative   Comes to appointments with wife    0 Caffeine drinks daily     ROS: Blind chronic leg pain and edema otherwise see history of present illness   PHYSICAL EXAM: Obese, in no acute distress. Neck: No JVD, HJR, Bruit, or thyroid enlargement  Lungs: Decreased breath sounds throughout, No tachypnea, clear without wheezing, rales, or rhonchi  Cardiovascular: Pacer site healing well. Some bruising and redness from the tape,RRR, PMI not displaced, heart sounds distant, 1/6 systolic murmur at the left sternal border, no gallops, bruit, thrill, or heave.  Abdomen: BS normal. Soft without organomegaly, masses, lesions or tenderness.  Extremities: Right groin without hematoma or hemorrhage at cath site decreased distal pulses otherwise lower extremities +2 edema on the right and +1 on the left with cyanosis and clubbing and brawny changes from chronic edema, decreased distal pulses bilaterally  SKin: Warm, small  healing ulcers on his anterior tibia  bilaterally  Musculoskeletal: No deformities  Neuro: no focal signs  BP 113/58  Pulse 62  Ht 5\' 1"  (1.549 m)  Wt 228 lb (103.42 kg)  BMI 43.10 kg/m2      EKG: AV paced rhythm at 62 beats per minute   Echo: 08/09/2013  Study Conclusions - Left ventricle: The cavity size was normal. The estimated ejection fraction was 40%. There is severe hypokinesis of the entire inferior myocardium. There was an increased relative contribution of atrial contraction to ventricular filling. Doppler parameters are consistent with abnormal left ventricular relaxation (grade 1 diastolic dysfunction). - Ventricular septum: Septal motion showed paradox. - Mitral valve: Mild regurgitation. - Left atrium: The atrium was mildly dilated. - Atrial septum: No defect or patent foramen ovale was identified. - Pulmonary arteries: PA peak pressure: 67mm Hg (S). Impressions: - The right ventricular systolic pressure was increased consistent with moderate pulmonary hypertension.  Cardiac Cath: 08/07/2013   Temporary Transvenous Pacemaker Placement:  A 5 Fr Transvenous Pacer Wire was advanced through the Venous Sheath under fluoroscopic guidance, and with with balloon inflated advanced into the Right Ventricular Apex. The wires were connected to the pacing computer. Thresholds were measured & settings were set at 5 mAMP, rate of 70 bpm. TTVP distance ~65-67 mm.   Coronary Anatomy:   Left Main: Moderate caliber vessel that bifurcates into the LAD & Circumflex; angiographically normal LAD: Patent proximally with large proximal septal trunk (with faint L-R collaterals to RPDA) followed by extensive calcification and 100% mid occlusion. Small proximal Diag.   D2: Perfused by patent SVG Left Circumflex: 100% proximal occlusion after Very small marginal branch.   RCA: Small to moderate caliber, dominant vessel with severe diffuse proximal and distal disease, yet TIMI 1 flow to the small caliber PDA.    Graft  Anatomy:    LIMA-LAD: widely patent to a diminutive target LAD that wraps the apex; retrograde flow to a large septal perforator    SVG-Diag: widely patent with ~30-40% "hinge-point" in mid vessel; stent is widely patent.    SVG-OM & SVG-RCA: known occlusion.

## 2013-08-27 NOTE — Assessment & Plan Note (Signed)
Patient's heart failure is well compensated today. I will not change his diuretics. We will check his renal function next week to make sure his creatinine has stabilized

## 2013-08-27 NOTE — Progress Notes (Signed)
Wound check appointment.  Wound without redness or edema. Incision edges approximated, wound well healed. Normal device function. Thresholds, sensing, and impedances consistent with implant measurements. Device programmed at 3.5V/auto capture programmed on for extra safety margin until 3 month visit. Histogram distribution appropriate for patient and level of activity. No mode switches or high ventricular rates noted. Patient educated about wound care, arm mobility, lifting restrictions. ROV in 3 months with implanting physician.

## 2013-08-27 NOTE — Assessment & Plan Note (Signed)
Creatinine 1.5 on Monday. We will recheck next week.

## 2013-08-27 NOTE — Assessment & Plan Note (Signed)
Will check fasting lipid panel. Hasn't been done since 2013.

## 2013-09-01 ENCOUNTER — Other Ambulatory Visit: Payer: Self-pay | Admitting: Endocrinology

## 2013-09-01 ENCOUNTER — Other Ambulatory Visit (INDEPENDENT_AMBULATORY_CARE_PROVIDER_SITE_OTHER): Payer: Medicare Other

## 2013-09-01 ENCOUNTER — Other Ambulatory Visit: Payer: Self-pay | Admitting: Cardiovascular Disease

## 2013-09-01 DIAGNOSIS — I25810 Atherosclerosis of coronary artery bypass graft(s) without angina pectoris: Secondary | ICD-10-CM

## 2013-09-01 LAB — BASIC METABOLIC PANEL
BUN: 16 mg/dL (ref 6–23)
CALCIUM: 8.9 mg/dL (ref 8.4–10.5)
CHLORIDE: 101 meq/L (ref 96–112)
CO2: 30 meq/L (ref 19–32)
CREATININE: 1.3 mg/dL (ref 0.4–1.5)
GFR: 57.5 mL/min — ABNORMAL LOW (ref >60.00)
GLUCOSE: 115 mg/dL — AB (ref 70–99)
Potassium: 4.2 mEq/L (ref 3.5–5.1)
Sodium: 136 mEq/L (ref 135–145)

## 2013-09-03 ENCOUNTER — Telehealth: Payer: Self-pay

## 2013-09-03 ENCOUNTER — Encounter: Payer: Self-pay | Admitting: Internal Medicine

## 2013-09-03 ENCOUNTER — Telehealth: Payer: Self-pay | Admitting: Family Medicine

## 2013-09-03 NOTE — Telephone Encounter (Signed)
Allyson Sabal from [facility] Notified that Dr is ok with visit changes.

## 2013-09-03 NOTE — Telephone Encounter (Signed)
Pt's wife called requesting a refill on Hydrocodone 08/21/2013 and last seen on 08/21/2013.  Thanks!

## 2013-09-03 NOTE — Telephone Encounter (Signed)
ok 

## 2013-09-03 NOTE — Telephone Encounter (Signed)
Sorry, too soon.  It was refilled 13 days ago.

## 2013-09-03 NOTE — Telephone Encounter (Signed)
Allyson Sabal with [facility] left message.  She said that during her visit with pt on Monday, he stated that she was wasting his time and hers and that he wanted to be d/c'd.  His wife then said that they should probably still have a nurse come out "every now and then".    She is requesting order for a nurse visit every other week x 2 weeks and then d/c.  Cb [phone].    Pt is not a pt here at [facility].  Forwarded to pt's PCP, Dr. Loanne Drilling.

## 2013-09-04 NOTE — Telephone Encounter (Signed)
Pt informed

## 2013-09-05 ENCOUNTER — Encounter: Payer: Self-pay | Admitting: Endocrinology

## 2013-09-05 ENCOUNTER — Ambulatory Visit (INDEPENDENT_AMBULATORY_CARE_PROVIDER_SITE_OTHER): Payer: Medicare Other | Admitting: Endocrinology

## 2013-09-05 VITALS — BP 104/60 | HR 68 | Temp 97.8°F | Ht 61.0 in | Wt 222.0 lb

## 2013-09-05 DIAGNOSIS — G894 Chronic pain syndrome: Secondary | ICD-10-CM | POA: Insufficient documentation

## 2013-09-05 MED ORDER — HYDROCODONE-ACETAMINOPHEN 10-325 MG PO TABS: 1.0000 | ORAL_TABLET | ORAL | Status: DC | PRN

## 2013-09-05 NOTE — Patient Instructions (Addendum)
Please come back for a follow-up appointment in 2 weeks.   Refer to a pain specialist.  you will receive a phone call, about a day and time for an appointment.   Here is a refill of the pain medication, pending that appointment.   Please continue the same "calcitriol" pill.

## 2013-09-05 NOTE — Progress Notes (Signed)
Subjective:    Patient ID: Mark French, male    DOB: September 07, 1945, 68 y.o.   MRN: [id number]  HPI Pt says he hurts "from head to toe."  He frequently requests early refills of pain meds.  Since on the rocaltrol, he feels no different.  Past Medical History  Diagnosis Date  . CORONARY ARTERY DISEASE     a. s/p CABG 1998. b.  08/2011: inferior STEMI.  LHC 09/07/11: Severe 3v CAD, LIMA-LAD patent, SVG-RCA chronically occluded, SVG-circumflex chronically occluded, SVG-diagonal 95%. PCI: Promus DES to the SVG-diagonal.  Procedure c/b transient CHB req CPR and TTVP. c. Myoview 11/2011: scar but no ischemia.   [name] DIABETES MELLITUS, TYPE I   . HYPERTENSION   . 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 HYPERCHOLESTEROLEMIA   . CERVICAL RADICULOPATHY, LEFT   . PERIPHERAL NEUROPATHY   . Chronic systolic heart failure     Echocardiogram 08/09/13:Cavity size normal, EF 40%, inferior hypokinesis, grade 1 diastolic dysfunction, paradoxical septal motion, mild MR, mild LAE, no PFO, PAS 51, moderate pulm HTN  . ED (erectile dysfunction)   . Hypogonadism male   . GI bleed 2012    Multiple Duodenal ulcer   . Obesity   . Legally blind     bilaterally  . Stroke 79    age 44 months; residual right sided weakness  . History of blood transfusion 2012  . Charcot foot due to diabetes mellitus     chronic pain  . Osteoarthritis     right hip and shoulder  . COPD (chronic obstructive pulmonary disease)   . 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 Hiatal hernia   . Chronic stasis dermatitis     Chronic stasis changes.  . CKD (chronic kidney disease), stage III   . Syncope     a. 07/2013: suspected due to hypotension.  . Biventricular cardiac pacemaker in situ     a. [address] [employer] [place] RF model [id number] (serial number [id number])  . Complete heart block     a. s/p pacemaker placement on 08/2013 admission    Past Surgical History  Procedure Laterality Date  . Carpal  tunnel release      left  . Sigmoidoscopy  03/11/2001  . Venous doppler  01/30/2004  . Tonsillectomy and adenoidectomy      "as a kid"  . Cataract extraction w/ intraocular lens  implant, bilateral    . Coronary angioplasty with stent placement  08/2011    "1"  . Retinopathy surgery  2000's    "laser; both eyes"  . Wrist fusion  1976    right  . Anterior fusion cervical spine  2010    "C spine; Dr. Ronnald Ramp"  . Ankle fracture surgery  1976; ?date    LEFT:  fused; removed bulk of hardware  . Heel spur surgery  ~ 2007    ? left  . Irrigation and debridement knee  02/27/2012    Procedure: IRRIGATION AND DEBRIDEMENT KNEE;  Surgeon: Augustin Schooling, MD;  Location: [facility];  Service: Orthopedics;  Laterality: Right;  irrigation and drainage right knee septic bursitis  . Eye surgery  2008    cataract removal, cautery  . Coronary artery bypass graft  1998    CABG X5  . Fracture surgery      foot broken 1976  . Pacemaker insertion  08/08/2013    Biventricular pacemaker: [place] RF model [id number] (serial number [id number])  History   Social History  . Marital Status: Married    Spouse Name: N/A    Number of Children: 0  . Years of Education: N/A   Occupational History  . Disabled    Social History Main Topics  . Smoking status: Never Smoker   . Smokeless tobacco: Never Used  . Alcohol Use: No  . Drug Use: No  . Sexual Activity: No   Other Topics Concern  . Not on file   Social History Narrative   Comes to appointments with wife      0 Caffeine drinks daily     Current Outpatient Prescriptions on File Prior to Visit  Medication Sig Dispense Refill  . albuterol (PROVENTIL HFA;VENTOLIN HFA) 108 (90 BASE) MCG/ACT inhaler Inhale 2 puffs into the lungs every 6 (six) hours as needed for wheezing or shortness of breath.       [name] albuterol (PROVENTIL) (2.5 MG/3ML) 0.083% nebulizer solution Take 2.5 mg by nebulization 2 (two) times daily as needed for wheezing or  shortness of breath.       [name] aspirin EC 81 MG tablet Take 81 mg by mouth at bedtime.      [name] atorvastatin (LIPITOR) 40 MG tablet Take 40 mg by mouth at bedtime.       . budesonide (PULMICORT) 0.25 MG/2ML nebulizer solution Take 0.25 mg by nebulization daily as needed (shortness of breath).       . calcitRIOL (ROCALTROL) 0.25 MCG capsule Take 1 capsule (0.25 mcg total) by mouth daily.  30 capsule  11  . carvedilol (COREG) 3.125 MG tablet Take 1 tablet (3.125 mg total) by mouth 2 (two) times daily with a meal.  60 tablet  5  . clopidogrel (PLAVIX) 75 MG tablet Take 75 mg by mouth daily with breakfast.      . co-enzyme Q-10 30 MG capsule Take 30 mg by mouth daily at 2 PM daily at 2 PM.       . docusate sodium (COLACE) 100 MG capsule Take 100 mg by mouth 2 (two) times daily as needed for constipation.      . gabapentin (NEURONTIN) 600 MG tablet TAKE 1 TABLET BY MOUTH THREE TIMES DAILY  90 tablet  0  . insulin NPH (HUMULIN N,NOVOLIN N) 100 UNIT/ML injection Inject 70 Units into the skin daily before breakfast.  2 vial  2  . ipratropium (ATROVENT) 0.02 % nebulizer solution Take 500 mcg by nebulization every 6 (six) hours as needed for wheezing or shortness of breath.       . isosorbide mononitrate (IMDUR) 30 MG 24 hr tablet TAKE 1 TABLET BY MOUTH EVERY DAY  30 tablet  0  . lactulose, encephalopathy, (CHRONULAC) 10 GM/15ML SOLN Take 30 g by mouth.      . losartan (COZAAR) 50 MG tablet Take 0.5 tablets (25 mg total) by mouth daily.  30 tablet  5  . nitroGLYCERIN (NITROSTAT) 0.4 MG SL tablet Place 1 tablet (0.4 mg total) under the tongue every 5 (five) minutes as needed for chest pain.  25 tablet  5  . OVER THE COUNTER MEDICATION Apply 1 application topically daily as needed. Anti fungal cream      . OVER THE COUNTER MEDICATION Place 1 drop into both eyes daily as needed (dry eyes). [facility] eye drops      . pantoprazole (PROTONIX) 40 MG tablet Take 1 tablet (40 mg total) by mouth daily.  30 tablet  3  .  potassium chloride SA (K-DUR,KLOR-CON)  20 MEQ tablet Take 20 mEq by mouth 2 (two) times daily.      . silodosin (RAPAFLO) 8 MG CAPS capsule Take 8 mg by mouth at bedtime.       . torsemide (DEMADEX) 20 MG tablet Take 60 mg by mouth 2 (two) times daily. Morning and mid afternoon      . torsemide (DEMADEX) 20 MG tablet TAKE 3 TABLETS BY MOUTH EVERY MORNING AND 3 TABLETS BY MOUTH IN THE AFTERNOON AROUND 2 PM  180 tablet  1   No current facility-administered medications on file prior to visit.    Allergies  Allergen Reactions  . Sildenafil Nausea Only and Other (See Comments)    "took it once; heart went to pieces; never took it again"  . Percocet [Oxycodone-Acetaminophen] Other (See Comments)    Unknown reaction-possible altered mental state-per wife.    . Contrast Media [Iodinated Diagnostic Agents] Nausea Only    Family History  Problem Relation Age of Onset  . Heart disease Mother     Coronary Artery Disease  . Heart disease Father     Coronary Artery Disease  . Heart disease Paternal Grandmother     Coronary Artery Disease  . Colon cancer Neg Hx   . Diabetes Other     Grandmother    BP 104/60  Pulse 68  Temp(Src) 97.8 F (36.6 C) (Oral)  Ht 5\' 1"  (1.549 m)  Wt 222 lb (100.699 kg)  BMI 41.97 kg/m2  SpO2 98%  Review of Systems Denies muscle cramps.      Objective:   Physical Exam VITAL SIGNS:  See vs page.  GENERAL: no distress.  Obese.  In wheelchair.   MSK: no muscle tenderness.       Assessment & Plan:  Chronic pain syndrome, not well-controlled Secondary hyperparathyroidism, on rx.

## 2013-09-09 ENCOUNTER — Telehealth: Payer: Self-pay

## 2013-09-09 NOTE — Telephone Encounter (Signed)
[facility] nurse called stating that pt has been taking 600 mg extra of his gabapentin. Pt admitted to this and stated it was due to his leg pain. Nurse wanted Dr to be informed.

## 2013-09-09 NOTE — Telephone Encounter (Signed)
Noted, thank you

## 2013-09-10 DIAGNOSIS — I2510 Atherosclerotic heart disease of native coronary artery without angina pectoris: Secondary | ICD-10-CM

## 2013-09-10 DIAGNOSIS — J449 Chronic obstructive pulmonary disease, unspecified: Secondary | ICD-10-CM

## 2013-09-10 DIAGNOSIS — I509 Heart failure, unspecified: Secondary | ICD-10-CM

## 2013-09-10 DIAGNOSIS — I5022 Chronic systolic (congestive) heart failure: Secondary | ICD-10-CM

## 2013-09-19 ENCOUNTER — Other Ambulatory Visit: Payer: Self-pay | Admitting: Endocrinology

## 2013-09-24 ENCOUNTER — Telehealth: Payer: Self-pay | Admitting: Endocrinology

## 2013-09-24 ENCOUNTER — Telehealth: Payer: Self-pay

## 2013-09-24 MED ORDER — HYDROCODONE-ACETAMINOPHEN 10-325 MG PO TABS: 1.0000 | ORAL_TABLET | ORAL | Status: DC | PRN

## 2013-09-24 NOTE — Telephone Encounter (Signed)
What is status of pain referral?

## 2013-09-24 NOTE — Telephone Encounter (Signed)
i printed 

## 2013-09-24 NOTE — Telephone Encounter (Signed)
Pt informed that script is ready for pick up. Script placed upfront.  

## 2013-09-24 NOTE — Telephone Encounter (Signed)
Pt's wife called requesting a refill for Hydrocodone. Medication was last filled and pt was last seen on 09/05/2013.  Please advise, Thanks!

## 2013-09-25 NOTE — Telephone Encounter (Signed)
Called to check status and was told it is in review.

## 2013-09-28 ENCOUNTER — Other Ambulatory Visit: Payer: Self-pay | Admitting: Cardiovascular Disease

## 2013-10-01 ENCOUNTER — Other Ambulatory Visit: Payer: Self-pay | Admitting: Endocrinology

## 2013-10-01 ENCOUNTER — Telehealth: Payer: Self-pay

## 2013-10-01 ENCOUNTER — Other Ambulatory Visit: Payer: Self-pay | Admitting: Cardiovascular Disease

## 2013-10-01 NOTE — Telephone Encounter (Signed)
This appears to be Dr. Cordelia Pen patient, please confirm.

## 2013-10-01 NOTE — Telephone Encounter (Signed)
Tiffany with [facility] wants to know if orders has been faxed back; could not find orders under media and Tiffany will refax to [phone] for signature.

## 2013-10-01 NOTE — Telephone Encounter (Signed)
I advised Tiffany that pt is Dr Cordelia Pen pt and Jonelle Sidle will contact Dr Cordelia Pen office.

## 2013-10-09 ENCOUNTER — Other Ambulatory Visit: Payer: Self-pay | Admitting: Neurological Surgery

## 2013-10-09 DIAGNOSIS — M542 Cervicalgia: Secondary | ICD-10-CM

## 2013-10-13 ENCOUNTER — Other Ambulatory Visit: Payer: Self-pay | Admitting: Endocrinology

## 2013-10-13 ENCOUNTER — Telehealth: Payer: Self-pay | Admitting: Endocrinology

## 2013-10-13 ENCOUNTER — Ambulatory Visit
Admission: RE | Admit: 2013-10-13 | Discharge: 2013-10-13 | Disposition: A | Discharge status: Home / Self Care | Payer: Medicare Other | Source: Ambulatory Visit | Attending: Neurological Surgery | Admitting: Neurological Surgery

## 2013-10-13 DIAGNOSIS — M542 Cervicalgia: Secondary | ICD-10-CM

## 2013-10-13 MED ORDER — HYDROCODONE-ACETAMINOPHEN 10-325 MG PO TABS: 1.0000 | ORAL_TABLET | ORAL | Status: DC | PRN

## 2013-10-13 NOTE — Telephone Encounter (Signed)
Pt would like refill on Hydrocodone. Med was last filled 09/24/2013 and pt was last seen on 09/05/2013.  Thanks!

## 2013-10-13 NOTE — Telephone Encounter (Signed)
Can we pick up hydorcodone rx his pain is getting worse in his shoulder and neck.

## 2013-10-13 NOTE — Telephone Encounter (Signed)
I printed  

## 2013-10-13 NOTE — Telephone Encounter (Signed)
Pt picked up script

## 2013-10-13 NOTE — Addendum Note (Signed)
Addended by: [name] on: 10/13/2013 03:15 PM   Modules accepted: Orders

## 2013-10-14 ENCOUNTER — Other Ambulatory Visit: Payer: Self-pay | Admitting: *Deleted

## 2013-10-14 MED ORDER — ALBUTEROL SULFATE HFA 108 (90 BASE) MCG/ACT IN AERS: 2.0000 | INHALATION_SPRAY | Freq: Four times a day (QID) | RESPIRATORY_TRACT | Status: DC | PRN

## 2013-10-15 ENCOUNTER — Ambulatory Visit (INDEPENDENT_AMBULATORY_CARE_PROVIDER_SITE_OTHER): Payer: Medicare Other | Admitting: Cardiovascular Disease

## 2013-10-15 ENCOUNTER — Encounter: Payer: Self-pay | Admitting: Cardiovascular Disease

## 2013-10-15 VITALS — BP 113/61 | HR 67 | Ht 61.0 in | Wt 230.0 lb

## 2013-10-15 DIAGNOSIS — I5022 Chronic systolic (congestive) heart failure: Secondary | ICD-10-CM

## 2013-10-15 DIAGNOSIS — R0602 Shortness of breath: Secondary | ICD-10-CM

## 2013-10-15 DIAGNOSIS — I25810 Atherosclerosis of coronary artery bypass graft(s) without angina pectoris: Secondary | ICD-10-CM

## 2013-10-15 NOTE — Patient Instructions (Signed)
Your physician recommends that you continue on your current medications as directed. Please refer to the Current Medication list given to you today.  Your physician recommends that you return for lab work in: a few days before your appointment with Dr. Burt Knack  Your physician recommends that you return for follow-up on 6/11 @ 10:00 am

## 2013-10-15 NOTE — Progress Notes (Signed)
HPI:   68 year old gentleman presenting for followup evaluation. The patient has multiple medical and cardiac problems. He has chronic systolic heart failure, chronic kidney disease, CAD status post CABG with multiple MIs, and chronic kidney disease. He presented in January with complete heart block and he ultimately required permanent pacing. A biventricular pacemaker was utilized. The patient has significant issues with chronic pain. He's been complaining of left upper back pain which is quite severe. He is under evaluation by neurosurgery. He just had a CT scan done. From a cardiac perspective, he is stable. His shortness of breath with activity is unchanged. He denies resting dyspnea, orthopnea, or PND. His leg swelling is unchanged. He is trying to avoid salty foods.  Outpatient Encounter Prescriptions as of 10/15/2013  Medication Sig  . albuterol (PROVENTIL HFA;VENTOLIN HFA) 108 (90 BASE) MCG/ACT inhaler Inhale 2 puffs into the lungs every 6 (six) hours as needed for wheezing or shortness of breath.  [name] albuterol (PROVENTIL) (2.5 MG/3ML) 0.083% nebulizer solution Take 2.5 mg by nebulization 2 (two) times daily as needed for wheezing or shortness of breath.   [name] aspirin EC 81 MG tablet Take 81 mg by mouth at bedtime.  [name] atorvastatin (LIPITOR) 40 MG tablet TAKE 1 TABLET BY MOUTH EVERY DAY  . budesonide (PULMICORT) 0.25 MG/2ML nebulizer solution Take 0.25 mg by nebulization daily as needed (shortness of breath).   . calcitRIOL (ROCALTROL) 0.25 MCG capsule Take 1 capsule (0.25 mcg total) by mouth daily.  . carvedilol (COREG) 3.125 MG tablet Take 1 tablet (3.125 mg total) by mouth 2 (two) times daily with a meal.  . clopidogrel (PLAVIX) 75 MG tablet Take 75 mg by mouth daily with breakfast.  . co-enzyme Q-10 30 MG capsule Take 30 mg by mouth daily at 2 PM daily at 2 PM.   . docusate sodium (COLACE) 100 MG capsule Take 100 mg by mouth 2 (two) times daily as needed for constipation.  . gabapentin  (NEURONTIN) 600 MG tablet TAKE 1 TABLET BY MOUTH THREE TIMES DAILY  . HYDROcodone-acetaminophen (NORCO) 10-325 MG per tablet Take 1-2 tablets by mouth every 4 (four) hours as needed for moderate pain.  [name] insulin NPH (HUMULIN N,NOVOLIN N) 100 UNIT/ML injection Inject 70 Units into the skin daily before breakfast.  . ipratropium (ATROVENT) 0.02 % nebulizer solution Take 500 mcg by nebulization every 6 (six) hours as needed for wheezing or shortness of breath.   . isosorbide mononitrate (IMDUR) 30 MG 24 hr tablet TAKE 1 TABLET BY MOUTH EVERY DAY  . losartan (COZAAR) 50 MG tablet Take 0.5 tablets (25 mg total) by mouth daily.  . nitroGLYCERIN (NITROSTAT) 0.4 MG SL tablet Place 1 tablet (0.4 mg total) under the tongue every 5 (five) minutes as needed for chest pain.  [name] OVER THE COUNTER MEDICATION Apply 1 application topically daily as needed. Anti fungal cream  . OVER THE COUNTER MEDICATION Place 1 drop into both eyes daily as needed (dry eyes). [facility] eye drops  . pantoprazole (PROTONIX) 40 MG tablet TAKE 1 TABLET BY MOUTH EVERY DAY  . potassium chloride SA (K-DUR,KLOR-CON) 20 MEQ tablet Take 20 mEq by mouth 2 (two) times daily.  . silodosin (RAPAFLO) 8 MG CAPS capsule Take 8 mg by mouth at bedtime.   . torsemide (DEMADEX) 20 MG tablet TAKE 3 TABLETS BY MOUTH EVERY MORNING AND 3 TABLETS BY MOUTH IN THE AFTERNOON AROUND 2 PM  . [DISCONTINUED] atorvastatin (LIPITOR) 40 MG tablet Take 40 mg by mouth at bedtime.   . [  DISCONTINUED] lactulose, encephalopathy, (CHRONULAC) 10 GM/15ML SOLN Take 30 g by mouth.    Allergies  Allergen Reactions  . Sildenafil Nausea Only and Other (See Comments)    "took it once; heart went to pieces; never took it again"  . Percocet [Oxycodone-Acetaminophen] Other (See Comments)    Unknown reaction-possible altered mental state-per wife.    . Contrast Media [Iodinated Diagnostic Agents] Nausea Only    Past Medical History  Diagnosis Date  . CORONARY ARTERY DISEASE      a. s/p CABG 1998. b.  08/2011: inferior STEMI.  LHC 09/07/11: Severe 3v CAD, LIMA-LAD patent, SVG-RCA chronically occluded, SVG-circumflex chronically occluded, SVG-diagonal 95%. PCI: Promus DES to the SVG-diagonal.  Procedure c/b transient CHB req CPR and TTVP. c. Myoview 11/2011: scar but no ischemia.   [name] DIABETES MELLITUS, TYPE I   . HYPERTENSION   . 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 HYPERCHOLESTEROLEMIA   . CERVICAL RADICULOPATHY, LEFT   . PERIPHERAL NEUROPATHY   . Chronic systolic heart failure     Echocardiogram 08/09/13:Cavity size normal, EF 40%, inferior hypokinesis, grade 1 diastolic dysfunction, paradoxical septal motion, mild MR, mild LAE, no PFO, PAS 51, moderate pulm HTN  . ED (erectile dysfunction)   . Hypogonadism male   . GI bleed 2012    Multiple Duodenal ulcer   . Obesity   . Legally blind     bilaterally  . Stroke 61    age 28 months; residual right sided weakness  . History of blood transfusion 2012  . Charcot foot due to diabetes mellitus     chronic pain  . Osteoarthritis     right hip and shoulder  . COPD (chronic obstructive pulmonary disease)   . 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 Hiatal hernia   . Chronic stasis dermatitis     Chronic stasis changes.  . CKD (chronic kidney disease), stage III   . Syncope     a. 07/2013: suspected due to hypotension.  . Biventricular cardiac pacemaker in situ     a. [address] [employer] [place] RF model [id number] (serial number [id number])  . Complete heart block     a. s/p pacemaker placement on 08/2013 admission    ROS: Negative except as per HPI  BP 113/61  Pulse 67  Ht 5\' 1"  (1.549 m)  Wt 230 lb (104.327 kg)  BMI 43.48 kg/m2  PHYSICAL EXAM: Pt is alert and oriented, obese, wheelchair-bound chronically ill-appearing male in NAD HEENT: normal Neck: JVP - normal, carotids 2+= without bruits Lungs: CTA bilaterally CV: RRR without murmur or gallop Abd: soft, NT, Positive  BS, no hepatomegaly Ext: 2+ brawny edema bilaterally with chronic stasis changes Skin: warm/dry no rash  ASSESSMENT AND PLAN: 1. Coronary atherosclerosis, status post CABG with autologous vein graft disease. Continue medical therapy in this patient without anginal symptoms.  2. Complete heart block. Patient is status post Bi V. Pacing.  3. Chronic systolic heart failure, [place] Heart Association class III. He will continue on his same diuretic regimen. I'll see him back in 3 months with a metabolic panel and BNP at that time.  4. Hyperlipidemia. Patient takes atorvastatin. Lipids in 2013 showed a cholesterol of 96, triglycerides 107, HDL 45, and LDL 30.  Her followup will see the patient back in 3 months.  Sherren Mocha 10/15/2013 6:06 PM

## 2013-10-20 ENCOUNTER — Other Ambulatory Visit: Payer: Self-pay | Admitting: Endocrinology

## 2013-10-25 ENCOUNTER — Encounter (HOSPITAL_COMMUNITY): Payer: Self-pay | Admitting: Emergency Medicine

## 2013-10-25 ENCOUNTER — Inpatient Hospital Stay (HOSPITAL_COMMUNITY)
Admission: EM | Admit: 2013-10-25 | Discharge: 2013-10-30 | DRG: 280 | Disposition: A | Discharge status: Hospice | Payer: Medicare Other | Attending: Internal Medicine | Admitting: Internal Medicine

## 2013-10-25 ENCOUNTER — Emergency Department (HOSPITAL_COMMUNITY): Payer: Medicare Other

## 2013-10-25 DIAGNOSIS — G894 Chronic pain syndrome: Secondary | ICD-10-CM

## 2013-10-25 DIAGNOSIS — G609 Hereditary and idiopathic neuropathy, unspecified: Secondary | ICD-10-CM

## 2013-10-25 DIAGNOSIS — J441 Chronic obstructive pulmonary disease with (acute) exacerbation: Secondary | ICD-10-CM | POA: Diagnosis present

## 2013-10-25 DIAGNOSIS — R5381 Other malaise: Secondary | ICD-10-CM

## 2013-10-25 DIAGNOSIS — E1049 Type 1 diabetes mellitus with other diabetic neurological complication: Secondary | ICD-10-CM

## 2013-10-25 DIAGNOSIS — R202 Paresthesia of skin: Secondary | ICD-10-CM

## 2013-10-25 DIAGNOSIS — I249 Acute ischemic heart disease, unspecified: Secondary | ICD-10-CM

## 2013-10-25 DIAGNOSIS — E669 Obesity, unspecified: Secondary | ICD-10-CM | POA: Diagnosis present

## 2013-10-25 DIAGNOSIS — E1165 Type 2 diabetes mellitus with hyperglycemia: Secondary | ICD-10-CM | POA: Diagnosis present

## 2013-10-25 DIAGNOSIS — R0789 Other chest pain: Secondary | ICD-10-CM

## 2013-10-25 DIAGNOSIS — I959 Hypotension, unspecified: Secondary | ICD-10-CM

## 2013-10-25 DIAGNOSIS — M79609 Pain in unspecified limb: Secondary | ICD-10-CM

## 2013-10-25 DIAGNOSIS — N259 Disorder resulting from impaired renal tubular function, unspecified: Secondary | ICD-10-CM

## 2013-10-25 DIAGNOSIS — I509 Heart failure, unspecified: Secondary | ICD-10-CM

## 2013-10-25 DIAGNOSIS — J9600 Acute respiratory failure, unspecified whether with hypoxia or hypercapnia: Secondary | ICD-10-CM

## 2013-10-25 DIAGNOSIS — I10 Essential (primary) hypertension: Secondary | ICD-10-CM

## 2013-10-25 DIAGNOSIS — I2582 Chronic total occlusion of coronary artery: Secondary | ICD-10-CM | POA: Diagnosis present

## 2013-10-25 DIAGNOSIS — I5022 Chronic systolic (congestive) heart failure: Secondary | ICD-10-CM

## 2013-10-25 DIAGNOSIS — K5903 Drug induced constipation: Secondary | ICD-10-CM

## 2013-10-25 DIAGNOSIS — N183 Chronic kidney disease, stage 3 unspecified: Secondary | ICD-10-CM

## 2013-10-25 DIAGNOSIS — Z981 Arthrodesis status: Secondary | ICD-10-CM

## 2013-10-25 DIAGNOSIS — J309 Allergic rhinitis, unspecified: Secondary | ICD-10-CM

## 2013-10-25 DIAGNOSIS — M169 Osteoarthritis of hip, unspecified: Secondary | ICD-10-CM | POA: Diagnosis present

## 2013-10-25 DIAGNOSIS — I739 Peripheral vascular disease, unspecified: Secondary | ICD-10-CM

## 2013-10-25 DIAGNOSIS — I200 Unstable angina: Secondary | ICD-10-CM

## 2013-10-25 DIAGNOSIS — IMO0002 Reserved for concepts with insufficient information to code with codable children: Secondary | ICD-10-CM | POA: Diagnosis present

## 2013-10-25 DIAGNOSIS — Z7982 Long term (current) use of aspirin: Secondary | ICD-10-CM

## 2013-10-25 DIAGNOSIS — E119 Type 2 diabetes mellitus without complications: Secondary | ICD-10-CM

## 2013-10-25 DIAGNOSIS — R109 Unspecified abdominal pain: Secondary | ICD-10-CM

## 2013-10-25 DIAGNOSIS — I2510 Atherosclerotic heart disease of native coronary artery without angina pectoris: Secondary | ICD-10-CM | POA: Diagnosis present

## 2013-10-25 DIAGNOSIS — I252 Old myocardial infarction: Secondary | ICD-10-CM

## 2013-10-25 DIAGNOSIS — Z950 Presence of cardiac pacemaker: Secondary | ICD-10-CM

## 2013-10-25 DIAGNOSIS — R0781 Pleurodynia: Secondary | ICD-10-CM

## 2013-10-25 DIAGNOSIS — IMO0001 Reserved for inherently not codable concepts without codable children: Secondary | ICD-10-CM

## 2013-10-25 DIAGNOSIS — R609 Edema, unspecified: Secondary | ICD-10-CM

## 2013-10-25 DIAGNOSIS — Z91041 Radiographic dye allergy status: Secondary | ICD-10-CM

## 2013-10-25 DIAGNOSIS — I25810 Atherosclerosis of coronary artery bypass graft(s) without angina pectoris: Secondary | ICD-10-CM

## 2013-10-25 DIAGNOSIS — Z833 Family history of diabetes mellitus: Secondary | ICD-10-CM

## 2013-10-25 DIAGNOSIS — Z6841 Body Mass Index (BMI) 40.0 and over, adult: Secondary | ICD-10-CM

## 2013-10-25 DIAGNOSIS — L97909 Non-pressure chronic ulcer of unspecified part of unspecified lower leg with unspecified severity: Secondary | ICD-10-CM

## 2013-10-25 DIAGNOSIS — E1142 Type 2 diabetes mellitus with diabetic polyneuropathy: Secondary | ICD-10-CM | POA: Diagnosis present

## 2013-10-25 DIAGNOSIS — I442 Atrioventricular block, complete: Secondary | ICD-10-CM

## 2013-10-25 DIAGNOSIS — R55 Syncope and collapse: Secondary | ICD-10-CM

## 2013-10-25 DIAGNOSIS — Z8679 Personal history of other diseases of the circulatory system: Secondary | ICD-10-CM

## 2013-10-25 DIAGNOSIS — R079 Chest pain, unspecified: Secondary | ICD-10-CM

## 2013-10-25 DIAGNOSIS — F411 Generalized anxiety disorder: Secondary | ICD-10-CM | POA: Diagnosis not present

## 2013-10-25 DIAGNOSIS — N179 Acute kidney failure, unspecified: Secondary | ICD-10-CM

## 2013-10-25 DIAGNOSIS — H548 Legal blindness, as defined in USA: Secondary | ICD-10-CM

## 2013-10-25 DIAGNOSIS — R7989 Other specified abnormal findings of blood chemistry: Secondary | ICD-10-CM

## 2013-10-25 DIAGNOSIS — I5043 Acute on chronic combined systolic (congestive) and diastolic (congestive) heart failure: Secondary | ICD-10-CM

## 2013-10-25 DIAGNOSIS — F329 Major depressive disorder, single episode, unspecified: Secondary | ICD-10-CM

## 2013-10-25 DIAGNOSIS — J9620 Acute and chronic respiratory failure, unspecified whether with hypoxia or hypercapnia: Secondary | ICD-10-CM | POA: Diagnosis present

## 2013-10-25 DIAGNOSIS — H9190 Unspecified hearing loss, unspecified ear: Secondary | ICD-10-CM

## 2013-10-25 DIAGNOSIS — Z888 Allergy status to other drugs, medicaments and biological substances status: Secondary | ICD-10-CM

## 2013-10-25 DIAGNOSIS — F3289 Other specified depressive episodes: Secondary | ICD-10-CM

## 2013-10-25 DIAGNOSIS — N400 Enlarged prostate without lower urinary tract symptoms: Secondary | ICD-10-CM

## 2013-10-25 DIAGNOSIS — Z7902 Long term (current) use of antithrombotics/antiplatelets: Secondary | ICD-10-CM

## 2013-10-25 DIAGNOSIS — Z91199 Patient's noncompliance with other medical treatment and regimen due to unspecified reason: Secondary | ICD-10-CM

## 2013-10-25 DIAGNOSIS — Z66 Do not resuscitate: Secondary | ICD-10-CM

## 2013-10-25 DIAGNOSIS — M71161 Other infective bursitis, right knee: Secondary | ICD-10-CM

## 2013-10-25 DIAGNOSIS — E785 Hyperlipidemia, unspecified: Secondary | ICD-10-CM | POA: Diagnosis present

## 2013-10-25 DIAGNOSIS — I69998 Other sequelae following unspecified cerebrovascular disease: Secondary | ICD-10-CM

## 2013-10-25 DIAGNOSIS — E7800 Pure hypercholesterolemia, unspecified: Secondary | ICD-10-CM

## 2013-10-25 DIAGNOSIS — I129 Hypertensive chronic kidney disease with stage 1 through stage 4 chronic kidney disease, or unspecified chronic kidney disease: Secondary | ICD-10-CM | POA: Diagnosis present

## 2013-10-25 DIAGNOSIS — M5412 Radiculopathy, cervical region: Secondary | ICD-10-CM

## 2013-10-25 DIAGNOSIS — Z9119 Patient's noncompliance with other medical treatment and regimen: Secondary | ICD-10-CM

## 2013-10-25 DIAGNOSIS — R200 Anesthesia of skin: Secondary | ICD-10-CM

## 2013-10-25 DIAGNOSIS — Z79899 Other long term (current) drug therapy: Secondary | ICD-10-CM

## 2013-10-25 DIAGNOSIS — D509 Iron deficiency anemia, unspecified: Secondary | ICD-10-CM

## 2013-10-25 DIAGNOSIS — Z515 Encounter for palliative care: Secondary | ICD-10-CM

## 2013-10-25 DIAGNOSIS — J449 Chronic obstructive pulmonary disease, unspecified: Secondary | ICD-10-CM

## 2013-10-25 DIAGNOSIS — Y832 Surgical operation with anastomosis, bypass or graft as the cause of abnormal reaction of the patient, or of later complication, without mention of misadventure at the time of the procedure: Secondary | ICD-10-CM | POA: Diagnosis present

## 2013-10-25 DIAGNOSIS — Z8249 Family history of ischemic heart disease and other diseases of the circulatory system: Secondary | ICD-10-CM

## 2013-10-25 DIAGNOSIS — R29898 Other symptoms and signs involving the musculoskeletal system: Secondary | ICD-10-CM | POA: Diagnosis present

## 2013-10-25 DIAGNOSIS — M25552 Pain in left hip: Secondary | ICD-10-CM

## 2013-10-25 DIAGNOSIS — E11359 Type 2 diabetes mellitus with proliferative diabetic retinopathy without macular edema: Secondary | ICD-10-CM

## 2013-10-25 DIAGNOSIS — I5023 Acute on chronic systolic (congestive) heart failure: Secondary | ICD-10-CM

## 2013-10-25 DIAGNOSIS — T82897A Other specified complication of cardiac prosthetic devices, implants and grafts, initial encounter: Secondary | ICD-10-CM | POA: Diagnosis present

## 2013-10-25 DIAGNOSIS — Z125 Encounter for screening for malignant neoplasm of prostate: Secondary | ICD-10-CM

## 2013-10-25 DIAGNOSIS — M1610 Unilateral primary osteoarthritis, unspecified hip: Secondary | ICD-10-CM | POA: Diagnosis present

## 2013-10-25 DIAGNOSIS — E1149 Type 2 diabetes mellitus with other diabetic neurological complication: Secondary | ICD-10-CM

## 2013-10-25 DIAGNOSIS — I214 Non-ST elevation (NSTEMI) myocardial infarction: Principal | ICD-10-CM

## 2013-10-25 DIAGNOSIS — L0390 Cellulitis, unspecified: Secondary | ICD-10-CM

## 2013-10-25 DIAGNOSIS — E291 Testicular hypofunction: Secondary | ICD-10-CM

## 2013-10-25 DIAGNOSIS — I441 Atrioventricular block, second degree: Secondary | ICD-10-CM

## 2013-10-25 DIAGNOSIS — Z794 Long term (current) use of insulin: Secondary | ICD-10-CM

## 2013-10-25 DIAGNOSIS — M19019 Primary osteoarthritis, unspecified shoulder: Secondary | ICD-10-CM | POA: Diagnosis present

## 2013-10-25 DIAGNOSIS — E1139 Type 2 diabetes mellitus with other diabetic ophthalmic complication: Secondary | ICD-10-CM | POA: Diagnosis present

## 2013-10-25 DIAGNOSIS — R778 Other specified abnormalities of plasma proteins: Secondary | ICD-10-CM

## 2013-10-25 LAB — MRSA PCR SCREENING: MRSA by PCR: NEGATIVE

## 2013-10-25 LAB — BLOOD GAS, ARTERIAL
ACID-BASE EXCESS: 0.2 mmol/L (ref 0.0–2.0)
Bicarbonate: 24.8 mEq/L — ABNORMAL HIGH (ref 20.0–24.0)
DELIVERY SYSTEMS: POSITIVE
DRAWN BY: 283381
EXPIRATORY PAP: 5
FIO2: 0.3 %
INSPIRATORY PAP: 10
O2 Saturation: 90.4 %
PH ART: 7.37 (ref 7.350–7.450)
PO2 ART: 63.5 mmHg — AB (ref 80.0–100.0)
Patient temperature: 98.6
TCO2: 26.2 mmol/L (ref 0–100)
pCO2 arterial: 43.9 mmHg (ref 35.0–45.0)

## 2013-10-25 LAB — TROPONIN I
Troponin I: <0.3 ng/mL (ref <0.30)
Troponin I: 2.84 ng/mL (ref <0.30)

## 2013-10-25 LAB — URINALYSIS, ROUTINE W REFLEX MICROSCOPIC
BILIRUBIN URINE: NEGATIVE
GLUCOSE, UA: NEGATIVE mg/dL
Hgb urine dipstick: NEGATIVE
Ketones, ur: NEGATIVE mg/dL
Leukocytes, UA: NEGATIVE
NITRITE: NEGATIVE
PH: 5.5 (ref 5.0–8.0)
Protein, ur: NEGATIVE mg/dL
SPECIFIC GRAVITY, URINE: 1.015 (ref 1.005–1.030)
Urobilinogen, UA: 0.2 mg/dL (ref 0.0–1.0)

## 2013-10-25 LAB — BASIC METABOLIC PANEL
BUN: 20 mg/dL (ref 6–23)
CALCIUM: 9.6 mg/dL (ref 8.4–10.5)
CO2: 26 mEq/L (ref 19–32)
CREATININE: 1.42 mg/dL — AB (ref 0.50–1.35)
Chloride: 96 mEq/L (ref 96–112)
GFR calc Af Amer: 56 mL/min — ABNORMAL LOW (ref >90)
GFR, EST NON AFRICAN AMERICAN: 49 mL/min — AB (ref >90)
GLUCOSE: 158 mg/dL — AB (ref 70–99)
Potassium: 3.7 mEq/L (ref 3.7–5.3)
Sodium: 137 mEq/L (ref 137–147)

## 2013-10-25 LAB — I-STAT ARTERIAL BLOOD GAS, ED
Acid-Base Excess: 3 mmol/L — ABNORMAL HIGH (ref 0.0–2.0)
BICARBONATE: 28.7 meq/L — AB (ref 20.0–24.0)
O2 Saturation: 97 %
PCO2 ART: 47.4 mmHg — AB (ref 35.0–45.0)
Patient temperature: 98.6
TCO2: 30 mmol/L (ref 0–100)
pH, Arterial: 7.391 (ref 7.350–7.450)
pO2, Arterial: 90 mmHg (ref 80.0–100.0)

## 2013-10-25 LAB — CBC WITH DIFFERENTIAL/PLATELET
BASOS PCT: 0 % (ref 0–1)
Basophils Absolute: 0 10*3/uL (ref 0.0–0.1)
EOS ABS: 0.5 10*3/uL (ref 0.0–0.7)
EOS PCT: 5 % (ref 0–5)
HCT: 41.5 % (ref 39.0–52.0)
Hemoglobin: 13.3 g/dL (ref 13.0–17.0)
LYMPHS ABS: 1.9 10*3/uL (ref 0.7–4.0)
Lymphocytes Relative: 19 % (ref 12–46)
MCH: 27.4 pg (ref 26.0–34.0)
MCHC: 32 g/dL (ref 30.0–36.0)
MCV: 85.6 fL (ref 78.0–100.0)
MONOS PCT: 10 % (ref 3–12)
Monocytes Absolute: 1 10*3/uL (ref 0.1–1.0)
Neutro Abs: 6.5 10*3/uL (ref 1.7–7.7)
Neutrophils Relative %: 65 % (ref 43–77)
Platelets: 191 10*3/uL (ref 150–400)
RBC: 4.85 MIL/uL (ref 4.22–5.81)
RDW: 14.1 % (ref 11.5–15.5)
WBC: 10 10*3/uL (ref 4.0–10.5)

## 2013-10-25 LAB — PRO B NATRIURETIC PEPTIDE: Pro B Natriuretic peptide (BNP): 936.7 pg/mL — ABNORMAL HIGH (ref 0–125)

## 2013-10-25 LAB — PROTIME-INR
INR: 0.85 (ref 0.00–1.49)
Prothrombin Time: 11.5 seconds — ABNORMAL LOW (ref 11.6–15.2)

## 2013-10-25 LAB — LACTIC ACID, PLASMA: Lactic Acid, Venous: 1.7 mmol/L (ref 0.5–2.2)

## 2013-10-25 LAB — GLUCOSE, CAPILLARY: Glucose-Capillary: 215 mg/dL — ABNORMAL HIGH (ref 70–99)

## 2013-10-25 MED ORDER — INSULIN ASPART 100 UNIT/ML ~~LOC~~ SOLN
4.0000 [IU] | Freq: Once | SUBCUTANEOUS | Status: AC
  Administered 2013-10-25: 4 [IU] via SUBCUTANEOUS

## 2013-10-25 MED ORDER — METHYLPREDNISOLONE SODIUM SUCC 125 MG IJ SOLR
125.0000 mg | Freq: Once | INTRAMUSCULAR | Status: AC
  Administered 2013-10-25: 125 mg via INTRAVENOUS
  Filled 2013-10-25: qty 2

## 2013-10-25 MED ORDER — INSULIN NPH (HUMAN) (ISOPHANE) 100 UNIT/ML ~~LOC~~ SUSP
35.0000 [IU] | Freq: Every day | SUBCUTANEOUS | Status: DC
  Administered 2013-10-26 – 2013-10-30 (×4): 35 [IU] via SUBCUTANEOUS
  Filled 2013-10-25 (×3): qty 10

## 2013-10-25 MED ORDER — INSULIN ASPART 100 UNIT/ML ~~LOC~~ SOLN
0.0000 [IU] | Freq: Three times a day (TID) | SUBCUTANEOUS | Status: DC
  Administered 2013-10-25: 5 [IU] via SUBCUTANEOUS
  Administered 2013-10-26: 3 [IU] via SUBCUTANEOUS
  Administered 2013-10-26: 2 [IU] via SUBCUTANEOUS
  Administered 2013-10-27: 5 [IU] via SUBCUTANEOUS
  Administered 2013-10-28 (×2): 2 [IU] via SUBCUTANEOUS
  Administered 2013-10-30: 3 [IU] via SUBCUTANEOUS

## 2013-10-25 MED ORDER — CLOPIDOGREL BISULFATE 75 MG PO TABS
75.0000 mg | ORAL_TABLET | Freq: Every day | ORAL | Status: DC
  Administered 2013-10-26 – 2013-10-30 (×5): 75 mg via ORAL
  Filled 2013-10-25 (×6): qty 1

## 2013-10-25 MED ORDER — POTASSIUM CHLORIDE 10 MEQ/100ML IV SOLN
10.0000 meq | INTRAVENOUS | Status: AC
  Administered 2013-10-25 – 2013-10-26 (×2): 10 meq via INTRAVENOUS
  Filled 2013-10-25 (×2): qty 100

## 2013-10-25 MED ORDER — ATORVASTATIN CALCIUM 40 MG PO TABS
40.0000 mg | ORAL_TABLET | Freq: Every day | ORAL | Status: DC
  Administered 2013-10-25 – 2013-10-29 (×5): 40 mg via ORAL
  Filled 2013-10-25 (×7): qty 1

## 2013-10-25 MED ORDER — LOSARTAN POTASSIUM 25 MG PO TABS
25.0000 mg | ORAL_TABLET | Freq: Every day | ORAL | Status: DC
  Administered 2013-10-25 – 2013-10-30 (×6): 25 mg via ORAL
  Filled 2013-10-25 (×6): qty 1

## 2013-10-25 MED ORDER — HYDROCODONE-ACETAMINOPHEN 10-325 MG PO TABS
1.0000 | ORAL_TABLET | ORAL | Status: DC | PRN
  Administered 2013-10-25: 2 via ORAL
  Administered 2013-10-26 (×2): 1 via ORAL
  Filled 2013-10-25 (×2): qty 1
  Filled 2013-10-25: qty 2

## 2013-10-25 MED ORDER — CALCITRIOL 0.25 MCG PO CAPS
0.2500 ug | ORAL_CAPSULE | Freq: Every day | ORAL | Status: DC
  Administered 2013-10-25 – 2013-10-30 (×6): 0.25 ug via ORAL
  Filled 2013-10-25 (×7): qty 1

## 2013-10-25 MED ORDER — PANTOPRAZOLE SODIUM 40 MG PO TBEC
40.0000 mg | DELAYED_RELEASE_TABLET | Freq: Every day | ORAL | Status: DC
  Administered 2013-10-25 – 2013-10-30 (×6): 40 mg via ORAL
  Filled 2013-10-25 (×6): qty 1

## 2013-10-25 MED ORDER — GABAPENTIN 600 MG PO TABS
600.0000 mg | ORAL_TABLET | Freq: Three times a day (TID) | ORAL | Status: DC
  Administered 2013-10-25 – 2013-10-26 (×3): 600 mg via ORAL
  Filled 2013-10-25 (×5): qty 1

## 2013-10-25 MED ORDER — FUROSEMIDE 10 MG/ML IJ SOLN
40.0000 mg | Freq: Once | INTRAMUSCULAR | Status: AC
  Administered 2013-10-25: 40 mg via INTRAVENOUS
  Filled 2013-10-25: qty 4

## 2013-10-25 MED ORDER — LORAZEPAM 2 MG/ML IJ SOLN
0.5000 mg | INTRAMUSCULAR | Status: DC | PRN
  Administered 2013-10-29: 0.5 mg via INTRAVENOUS
  Filled 2013-10-25: qty 1

## 2013-10-25 MED ORDER — ENOXAPARIN SODIUM 40 MG/0.4ML ~~LOC~~ SOLN
40.0000 mg | SUBCUTANEOUS | Status: DC
  Administered 2013-10-25: 40 mg via SUBCUTANEOUS
  Filled 2013-10-25: qty 0.4

## 2013-10-25 MED ORDER — SODIUM CHLORIDE 0.9 % IV SOLN
250.0000 mL | INTRAVENOUS | Status: DC | PRN
  Administered 2013-10-25: 250 mL via INTRAVENOUS

## 2013-10-25 MED ORDER — SODIUM CHLORIDE 0.9 % IJ SOLN: 3.0000 mL | INTRAMUSCULAR | Status: DC | PRN

## 2013-10-25 MED ORDER — ISOSORBIDE MONONITRATE ER 30 MG PO TB24
30.0000 mg | ORAL_TABLET | Freq: Every day | ORAL | Status: DC
  Administered 2013-10-25 – 2013-10-30 (×6): 30 mg via ORAL
  Filled 2013-10-25 (×7): qty 1

## 2013-10-25 MED ORDER — ASPIRIN 81 MG PO CHEW
324.0000 mg | CHEWABLE_TABLET | Freq: Once | ORAL | Status: AC
  Administered 2013-10-25: 324 mg via ORAL
  Filled 2013-10-25: qty 4

## 2013-10-25 MED ORDER — SODIUM CHLORIDE 0.9 % IJ SOLN
3.0000 mL | Freq: Two times a day (BID) | INTRAMUSCULAR | Status: DC
  Administered 2013-10-25 – 2013-10-27 (×5): 3 mL via INTRAVENOUS

## 2013-10-25 MED ORDER — POTASSIUM CHLORIDE CRYS ER 20 MEQ PO TBCR
20.0000 meq | EXTENDED_RELEASE_TABLET | Freq: Two times a day (BID) | ORAL | Status: DC
  Administered 2013-10-25 – 2013-10-30 (×10): 20 meq via ORAL
  Filled 2013-10-25 (×13): qty 1

## 2013-10-25 MED ORDER — ASPIRIN EC 81 MG PO TBEC
81.0000 mg | DELAYED_RELEASE_TABLET | Freq: Every day | ORAL | Status: DC
  Administered 2013-10-25 – 2013-10-29 (×3): 81 mg via ORAL
  Filled 2013-10-25 (×6): qty 1

## 2013-10-25 MED ORDER — TAMSULOSIN HCL 0.4 MG PO CAPS
0.4000 mg | ORAL_CAPSULE | Freq: Every day | ORAL | Status: DC
  Administered 2013-10-25 – 2013-10-27 (×3): 0.4 mg via ORAL
  Filled 2013-10-25 (×3): qty 1

## 2013-10-25 MED ORDER — FUROSEMIDE 10 MG/ML IJ SOLN
80.0000 mg | Freq: Three times a day (TID) | INTRAMUSCULAR | Status: DC
  Administered 2013-10-25 – 2013-10-26 (×2): 80 mg via INTRAVENOUS
  Filled 2013-10-25 (×5): qty 8

## 2013-10-25 MED ORDER — DOCUSATE SODIUM 100 MG PO CAPS
100.0000 mg | ORAL_CAPSULE | Freq: Two times a day (BID) | ORAL | Status: DC
  Administered 2013-10-25 – 2013-10-29 (×9): 100 mg via ORAL
  Filled 2013-10-25 (×10): qty 1

## 2013-10-25 MED ORDER — ALBUTEROL SULFATE (2.5 MG/3ML) 0.083% IN NEBU: 2.5000 mg | INHALATION_SOLUTION | RESPIRATORY_TRACT | Status: DC | PRN

## 2013-10-25 MED ORDER — CARVEDILOL 3.125 MG PO TABS
3.1250 mg | ORAL_TABLET | Freq: Two times a day (BID) | ORAL | Status: DC
  Administered 2013-10-25 – 2013-10-30 (×10): 3.125 mg via ORAL
  Filled 2013-10-25 (×13): qty 1

## 2013-10-25 MED ORDER — HEPARIN (PORCINE) IN NACL 100-0.45 UNIT/ML-% IJ SOLN
1050.0000 [IU]/h | INTRAMUSCULAR | Status: DC
  Administered 2013-10-25 – 2013-10-26 (×2): 1050 [IU]/h via INTRAVENOUS
  Filled 2013-10-25 (×4): qty 250

## 2013-10-25 NOTE — Progress Notes (Signed)
MD currently at beside.

## 2013-10-25 NOTE — ED Notes (Signed)
Patient taken off the Bi-Pap and placed on 4L Nasaol Cannula.

## 2013-10-25 NOTE — Progress Notes (Signed)
Attempted to insert 2nd IV, for IV Lasix and Potassium boluses, without success. IV team paged

## 2013-10-25 NOTE — Consult Note (Signed)
CARDIOLOGY CONSULT NOTE   Mark French MRN: [id number] DOB/AGE: May 29, 1946 68 y.o. Admit date: 10/25/2013  Referring Physician:  Dr. Shanon Brow Tat Primary Cardiologist: Dr. Burt Knack  Reason for consultation:  NSTEMI  HPI:  68 year old male with a history of diabetes mellitus, hypertension, CKD stage III, chronic systolic HF with EF of [id number], CVA, PVD, complete heart block s/p pacemaker placement and significant CAD s/p CABG 1998, inf STEMI 2013 with multiple PCIs post CABG, last cath revealing patent LIMA-LAD, CTO of SVGs to RCA and LCx with 95% stenosis in SVG to Diag and negative nuc stress in 10/2012, who was today to hospitalist service for acute SOB requiring BiPAP consistent with COPD and CHF exacerbation.   His troponin was initially normal but later inceased to 2.8 without notable ECG changes,  in the setting of CHF and CKD with cr of 1.5. Cardiology was consulted for NSTEMI.  On my interview, patient denied any chest pain or chest discomfort that is similar to previous MI. He is on Bipap for optimization of his breathing status. He feels his breathing is better and wanted to be off Bipap. At the time of interview, he was biv paced at rate of 70-80s with A sensing.     Review of systems: A review of 10 organ systems was done and is negative except as stated above in HPI  Past Medical History  Diagnosis Date  . CORONARY ARTERY DISEASE     a. s/p CABG 1998. b.  08/2011: inferior STEMI.  LHC 09/07/11: Severe 3v CAD, LIMA-LAD patent, SVG-RCA chronically occluded, SVG-circumflex chronically occluded, SVG-diagonal 95%. PCI: Promus DES to the SVG-diagonal.  Procedure c/b transient CHB req CPR and TTVP. c. Myoview 11/2011: scar but no ischemia.   [name] DIABETES MELLITUS, TYPE I   . HYPERTENSION   . 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 HYPERCHOLESTEROLEMIA   . CERVICAL RADICULOPATHY, LEFT   . PERIPHERAL NEUROPATHY   . Chronic systolic heart failure     Echocardiogram 08/09/13:Cavity size  normal, EF 40%, inferior hypokinesis, grade 1 diastolic dysfunction, paradoxical septal motion, mild MR, mild LAE, no PFO, PAS 51, moderate pulm HTN  . ED (erectile dysfunction)   . Hypogonadism male   . GI bleed 2012    Multiple Duodenal ulcer   . Obesity   . Legally blind     bilaterally  . Stroke 57    age 64 months; residual right sided weakness  . History of blood transfusion 2012  . Charcot foot due to diabetes mellitus     chronic pain  . Osteoarthritis     right hip and shoulder  . COPD (chronic obstructive pulmonary disease)   . 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 Hiatal hernia   . Chronic stasis dermatitis     Chronic stasis changes.  . CKD (chronic kidney disease), stage III   . Syncope     a. 07/2013: suspected due to hypotension.  . Biventricular cardiac pacemaker in situ     a. [address] [facility] RF model [id number] (serial number [id number])  . Complete heart block     a. s/p pacemaker placement on 08/2013 admission   Past Surgical History  Procedure Laterality Date  . Carpal tunnel release      left  . Sigmoidoscopy  03/11/2001  . Venous doppler  01/30/2004  . Tonsillectomy and adenoidectomy      "as a kid"  . Cataract extraction w/ intraocular lens  implant, bilateral    .  Coronary angioplasty with stent placement  08/2011    "1"  . Retinopathy surgery  2000's    "laser; both eyes"  . Wrist fusion  1976    right  . Anterior fusion cervical spine  2010    "C spine; Dr. Ronnald Ramp"  . Ankle fracture surgery  1976; ?date    LEFT:  fused; removed bulk of hardware  . Heel spur surgery  ~ 2007    ? left  . Irrigation and debridement knee  02/27/2012    Procedure: IRRIGATION AND DEBRIDEMENT KNEE;  Surgeon: Augustin Schooling, MD;  Location: [facility];  Service: Orthopedics;  Laterality: Right;  irrigation and drainage right knee septic bursitis  . Eye surgery  2008    cataract removal, cautery  . Coronary artery bypass graft  1998     CABG X5  . Fracture surgery      foot broken 1976  . Pacemaker insertion  08/08/2013    Biventricular pacemaker: [place] RF model [id number] (serial number [id number])   History   Social History  . Marital Status: Married    Spouse Name: N/A    Number of Children: 0  . Years of Education: N/A   Occupational History  . Disabled    Social History Main Topics  . Smoking status: Never Smoker   . Smokeless tobacco: Never Used  . Alcohol Use: No  . Drug Use: No  . Sexual Activity: No   Other Topics Concern  . Not on file   Social History Narrative   Comes to appointments with wife      0 Caffeine drinks daily     Family History  Problem Relation Age of Onset  . Heart disease Mother     Coronary Artery Disease  . Heart disease Father     Coronary Artery Disease  . Heart disease Paternal Grandmother     Coronary Artery Disease  . Colon cancer Neg Hx   . Diabetes Other     Grandmother     Allergies  Allergen Reactions  . Sildenafil Nausea Only and Other (See Comments)    "took it once; heart went to pieces; never took it again"  . Percocet [Oxycodone-Acetaminophen] Other (See Comments)    Unknown reaction-possible altered mental state-per wife.    . Contrast Media [Iodinated Diagnostic Agents] Nausea Only    Prescriptions prior to admission  Medication Sig Dispense Refill  . albuterol (PROVENTIL HFA;VENTOLIN HFA) 108 (90 BASE) MCG/ACT inhaler Inhale 2 puffs into the lungs every 6 (six) hours as needed for wheezing or shortness of breath.  1 Inhaler  11  . albuterol (PROVENTIL) (2.5 MG/3ML) 0.083% nebulizer solution Take 2.5 mg by nebulization 2 (two) times daily as needed for wheezing or shortness of breath.       [name] aspirin EC 81 MG tablet Take 81 mg by mouth at bedtime.      [name] atorvastatin (LIPITOR) 40 MG tablet TAKE 1 TABLET BY MOUTH EVERY DAY  30 tablet  0  . budesonide (PULMICORT) 0.25 MG/2ML nebulizer solution Take 0.25 mg by nebulization daily  as needed (shortness of breath).       . calcitRIOL (ROCALTROL) 0.25 MCG capsule Take 1 capsule (0.25 mcg total) by mouth daily.  30 capsule  11  . carvedilol (COREG) 3.125 MG tablet Take 1 tablet (3.125 mg total) by mouth 2 (two) times daily with a meal.  60 tablet  5  . clopidogrel (PLAVIX) 75 MG tablet  Take 75 mg by mouth daily with breakfast.      . co-enzyme Q-10 30 MG capsule Take 30 mg by mouth daily at 2 PM daily at 2 PM.       . docusate sodium (COLACE) 100 MG capsule Take 100 mg by mouth 2 (two) times daily as needed for constipation.      . gabapentin (NEURONTIN) 600 MG tablet TAKE 1 TABLET BY MOUTH THREE TIMES DAILY  90 tablet  0  . HYDROcodone-acetaminophen (NORCO) 10-325 MG per tablet Take 1-2 tablets by mouth every 4 (four) hours as needed for moderate pain.  150 tablet  0  . insulin NPH (HUMULIN N,NOVOLIN N) 100 UNIT/ML injection Inject 70 Units into the skin daily before breakfast.  2 vial  2  . ipratropium (ATROVENT) 0.02 % nebulizer solution Take 500 mcg by nebulization every 6 (six) hours as needed for wheezing or shortness of breath.       . isosorbide mononitrate (IMDUR) 30 MG 24 hr tablet TAKE 1 TABLET BY MOUTH EVERY DAY  30 tablet  0  . losartan (COZAAR) 50 MG tablet Take 0.5 tablets (25 mg total) by mouth daily.  30 tablet  5  . nitroGLYCERIN (NITROSTAT) 0.4 MG SL tablet Place 1 tablet (0.4 mg total) under the tongue every 5 (five) minutes as needed for chest pain.  25 tablet  5  . OVER THE COUNTER MEDICATION Apply 1 application topically daily as needed. Anti fungal cream      . OVER THE COUNTER MEDICATION Place 1 drop into both eyes daily as needed (dry eyes). [facility] eye drops      . pantoprazole (PROTONIX) 40 MG tablet TAKE 1 TABLET BY MOUTH EVERY DAY  30 tablet  0  . potassium chloride SA (K-DUR,KLOR-CON) 20 MEQ tablet Take 20 mEq by mouth 2 (two) times daily.      . silodosin (RAPAFLO) 8 MG CAPS capsule Take 8 mg by mouth at bedtime.       . torsemide (DEMADEX) 20  MG tablet TAKE 3 TABLETS BY MOUTH EVERY MORNING AND 3 TABLETS BY MOUTH IN THE AFTERNOON AROUND 2 PM  180 tablet  1    Current Facility-Administered Medications  Medication Dose Route Frequency Provider Last Rate Last Dose  . 0.9 %  sodium chloride infusion  250 mL Intravenous PRN Shanon Brow Tat, MD      . albuterol (PROVENTIL) (2.5 MG/3ML) 0.083% nebulizer solution 2.5 mg  2.5 mg Nebulization Q4H PRN Orson Eva, MD      . aspirin EC tablet 81 mg  81 mg Oral QHS David Tat, MD      . atorvastatin (LIPITOR) tablet 40 mg  40 mg Oral q1800 Orson Eva, MD   40 mg at 10/25/13 1730  . calcitRIOL (ROCALTROL) capsule 0.25 mcg  0.25 mcg Oral Daily Orson Eva, MD   0.25 mcg at 10/25/13 1729  . carvedilol (COREG) tablet 3.125 mg  3.125 mg Oral BID WC Orson Eva, MD   3.125 mg at 10/25/13 1730  . [START ON 10/26/2013] clopidogrel (PLAVIX) tablet 75 mg  75 mg Oral Q breakfast Shanon Brow Tat, MD      . docusate sodium (COLACE) capsule 100 mg  100 mg Oral BID Orson Eva, MD   100 mg at 10/25/13 1729  . enoxaparin (LOVENOX) injection 40 mg  40 mg Subcutaneous Q24H Orson Eva, MD   40 mg at 10/25/13 1728  . furosemide (LASIX) injection 80 mg  80 mg Intravenous 3 times  per day Orson Eva, MD   80 mg at 10/25/13 1736  . gabapentin (NEURONTIN) tablet 600 mg  600 mg Oral TID Orson Eva, MD   600 mg at 10/25/13 1730  . HYDROcodone-acetaminophen (NORCO) 10-325 MG per tablet 1-2 tablet  1-2 tablet Oral Q4H PRN Orson Eva, MD   2 tablet at 10/25/13 1346  . insulin aspart (novoLOG) injection 0-15 Units  0-15 Units Subcutaneous TID WC Orson Eva, MD   5 Units at 10/25/13 0500  . [START ON 10/26/2013] insulin NPH Human (HUMULIN N,NOVOLIN N) injection 35 Units  35 Units Subcutaneous QAC breakfast Shanon Brow Tat, MD      . isosorbide mononitrate (IMDUR) 24 hr tablet 30 mg  30 mg Oral Daily Orson Eva, MD   30 mg at 10/25/13 1729  . LORazepam (ATIVAN) injection 0.5 mg  0.5 mg Intravenous Q4H PRN Orson Eva, MD      . losartan (COZAAR) tablet 25 mg  25 mg  Oral Daily Orson Eva, MD   25 mg at 10/25/13 1729  . pantoprazole (PROTONIX) EC tablet 40 mg  40 mg Oral Daily Orson Eva, MD   40 mg at 10/25/13 1737  . potassium chloride SA (K-DUR,KLOR-CON) CR tablet 20 mEq  20 mEq Oral BID Orson Eva, MD   20 mEq at 10/25/13 1736  . sodium chloride 0.9 % injection 3 mL  3 mL Intravenous Q12H Orson Eva, MD   3 mL at 10/25/13 1737  . sodium chloride 0.9 % injection 3 mL  3 mL Intravenous PRN Orson Eva, MD      . tamsulosin [facility]) capsule 0.4 mg  0.4 mg Oral Daily Orson Eva, MD   0.4 mg at 10/25/13 1729    Physical Exam: Blood pressure 160/69, pulse 96, temperature 97.5 F (36.4 C), temperature source Axillary, resp. rate 26, height 5\' 1"  (1.549 m), weight 231 lb 9.6 oz (105.053 kg), SpO2 95.00%.; Body mass index is 43.78 kg/(m^2). Temp:  [97.5 F (36.4 C)] 97.5 F (36.4 C) (03/21 1432) Pulse Rate:  [87-123] 96 (03/21 1527) Resp:  [20-31] 26 (03/21 1527) BP: (113-184)/(46-101) 160/69 mmHg (03/21 1500) SpO2:  [88 %-100 %] 95 % (03/21 1527) FiO2 (%):  [40 %] 40 % (03/21 1200) Weight:  [231 lb 9.6 oz (105.053 kg)] 231 lb 9.6 oz (105.053 kg) (03/21 1434)   Intake/Output Summary (Last 24 hours) at 10/25/13 1938 Last data filed at 10/25/13 1342  Gross per 24 hour  Intake      0 ml  Output    800 ml  Net   -800 ml   General: NAD Heent: MMM Neck: JVP 12 CV: Nondisplaced PMI.  RRR, nl S1/S2, no S3/S4, no murmur. No carotid bruit   Lungs: Bilat crackles and expiratory wheezes. Abdomen: Soft, nontender, nondistended Extremities: Chronic browny induration with 1-2+ edema in bilat LE. Skin: Intact without lesions or rashes  Neurologic: Alert and oriented x 3, grossly nonfocal  Psych: Normal mood and affect    Labs:  Recent Labs  10/25/13 0847 10/25/13 1730  TROPONINI <0.30 2.84*   Lab Results  Component Value Date   WBC 10.0 10/25/2013   HGB 13.3 10/25/2013   HCT 41.5 10/25/2013   MCV 85.6 10/25/2013   PLT 191 10/25/2013    Recent Labs Lab  10/25/13 0847  NA 137  K 3.7  CL 96  CO2 26  BUN 20  CREATININE 1.42*  CALCIUM 9.6  GLUCOSE 158*   Lab Results  Component Value Date  CHOL 96 01/12/2012   HDL 45 01/12/2012   LDLCALC 30 01/12/2012   TRIG 107 01/12/2012       EKG:  A sense biv pacing, no significant changes compared to previous ECG except higher atrial rate at 120-130s.  Radiology:  Dg Chest Port 1 View  10/25/2013   CLINICAL DATA:  Shortness of breath.  EXAM: PORTABLE CHEST - 1 VIEW  COMPARISON:  August 18, 2013.  FINDINGS: Status post coronary artery bypass graft. Left-sided pacemaker is unchanged in position. Increased diffuse interstitial densities are noted bilaterally concerning for worsening edema or less likely pneumonia. No pneumothorax or pleural effusion is noted.  IMPRESSION: Increased bilateral interstitial densities most consistent with worsening pulmonary edema or less likely pneumonia.   Electronically Signed   By: Sabino Dick M.D.   On: 10/25/2013 09:05    ASSESSMENT:  68 year old male with chronic systolic HF with EF of 28%, complete AVB s/p PM and significant CAD s/p CABG 1998, inf STEMI 2013 who is seen in consultation for NSTEMI in the setting of COPD and CHF exacerbation  1. NSTEMI. He has complicated coronary and graft anatomy with last cath revealing patent LIMA-LAD, CTO of SVGs to RCA and LCx with 95% stenosis in SVG to Diag and negative nuc stress in 10/2012, he does not have typical chest pain or symptoms similar to previous MI, so I suspect his NSTEMI has more component of demand-supply imbalance and decreased clearance in the setting of CHF/COPD exacerbation and CKD than acute plaque rupture. I think it is reasonable to start Heparin gtt and proceed with LHC when his respiratory status is stabilized.  2. CHF exacerbation 3. H/o complete AVB s/p biv PM. With effective biventricular pacing pattern on ECG   PLAN:  1. Heparin gtt, continue ASA and Plavix 2. IV lasix diuresis, pt had good UOP  with 160 mg IV lasix since admission, goal I/O negative 2L first 24 hrs.  3. Continue coreg, atorvastatin, ARB, Nitro patch pRN for chest pain /pulmonary edema 4. Continue cycle Troponins 5. Plan for LHC when respiratory status stabilizes. Would keep NPO post MN. 6. Continue Bipap and wean if possible. 7. PM interrogation in am, RE: BiV pacing percentage.     Critical care time >45 min, for evaluation, coordinating care with nurse, discussion with wife and patient and discussion with referring physician.    Thank you for this consultation.  Will continue to follow.  Signed: Manus Gunning, MD Cardiology Fellow 10/25/2013, 7:38 PM

## 2013-10-25 NOTE — ED Notes (Signed)
Family at the bedside.

## 2013-10-25 NOTE — ED Notes (Addendum)
Pt. attempting to stand to use urinal @ bedside with RN, became severely s.o.b., placed back on bipap @ previous settings, MD made aware, RT to monitor.

## 2013-10-25 NOTE — ED Notes (Signed)
Pt. taken off bipap for another trial, MD @ bedside, placed on 4lpm n/c, RT to monitor.

## 2013-10-25 NOTE — Progress Notes (Addendum)
Called by RN to see patient secondary to respiratory distress.  Pt initially calmed down on BiPAP in ED, but after transport and transfer to the floor, the patient became increasingly tachypneic.  Pt denies cp, n/v/.  C/o sob.  Received Norco--2tabs at 1346 and lasix 80mg  IV in ED with solumedrol 125mg .  Wife at bedside.  Wife reports hx of COPD.  VS--HR 110-RR30-160/69  93% on 30% BiPAP CV-RRR, no rub Lung-bibasilar crackles, mild basilar wheeze, L>R.  Good air movement ABd-soft/nt+BS Ext--no erythema, 1+edema  1. STAT ABG--7.370/43/63/26 (0.3)  As I spent time in room and spoke with patient, pt calmed down and was less anxious.  He subsequently fell asleep with RR 18-20.  After relaxing, HR went down to 80.  FiO2 increased to 40% based on ABG.  Place standing order for ativan 0.5mg  q4hrs prn anxiety/aggitation.  Morphine 2mg  IV q 4 hrs prn pain.  Wife updated at bedside.  Wean BiPAP as tolerated with goal SpO2 >92.  Start albuterol prn.  DTat  1908 Addendum--paged with elevated troponin 2.84. Concerned about NSTEMI although decompensated CHF and CKD can elevate troponin.  Consulted Cardiology--spoke with cardiology on call already who will see patient.  Total time managing patient 45 min. With coordination of care and discussion with wife.  DTat

## 2013-10-25 NOTE — ED Notes (Signed)
Pt. c/o mask on bipap leaking, rr-28/100%/sats on 40%/taken off for trial 4 lpm n/c-replaced bipap,"breathing worse", +audible wheezing. RT to monitor.

## 2013-10-25 NOTE — ED Provider Notes (Signed)
CSN: [id number]     Arrival date & time 10/25/13  2725 History   First MD Initiated Contact with Patient 10/25/13 [phone]     Chief Complaint  Patient presents with  . Chest Pain  . Shortness of Breath     (Consider location/radiation/quality/duration/timing/severity/associated sxs/prior Treatment) Patient is a 68 y.o. male presenting with shortness of breath.  Shortness of Breath Severity:  Moderate Onset quality:  Sudden Duration: just prior to arrival. Timing:  Constant Progression:  Unchanged Chronicity:  New Context comment:  EMS was called for chest pain, associated with left chest pacemaker. As they were pulling into the ED, he developed severe sudden shortness of breath.. Relieved by:  Oxygen Worsened by:  Nothing tried Associated symptoms: chest pain and cough   Associated symptoms: no fever     Past Medical History  Diagnosis Date  . CORONARY ARTERY DISEASE     a. s/p CABG 1998. b.  08/2011: inferior STEMI.  LHC 09/07/11: Severe 3v CAD, LIMA-LAD patent, SVG-RCA chronically occluded, SVG-circumflex chronically occluded, SVG-diagonal 95%. PCI: Promus DES to the SVG-diagonal.  Procedure c/b transient CHB req CPR and TTVP. c. Myoview 11/2011: scar but no ischemia.   [name] DIABETES MELLITUS, TYPE I   . HYPERTENSION   . 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 HYPERCHOLESTEROLEMIA   . CERVICAL RADICULOPATHY, LEFT   . PERIPHERAL NEUROPATHY   . Chronic systolic heart failure     Echocardiogram 08/09/13:Cavity size normal, EF 40%, inferior hypokinesis, grade 1 diastolic dysfunction, paradoxical septal motion, mild MR, mild LAE, no PFO, PAS 51, moderate pulm HTN  . ED (erectile dysfunction)   . Hypogonadism male   . GI bleed 2012    Multiple Duodenal ulcer   . Obesity   . Legally blind     bilaterally  . Stroke 56    age 70 months; residual right sided weakness  . History of blood transfusion 2012  . Charcot foot due to diabetes mellitus     chronic pain  .  Osteoarthritis     right hip and shoulder  . COPD (chronic obstructive pulmonary disease)   . 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 Hiatal hernia   . Chronic stasis dermatitis     Chronic stasis changes.  . CKD (chronic kidney disease), stage III   . Syncope     a. 07/2013: suspected due to hypotension.  . Biventricular cardiac pacemaker in situ     a. 1 [employer] RF model PM3242 (serial number [id number])  . Complete heart block     a. s/p pacemaker placement on 08/2013 admission   Past Surgical History  Procedure Laterality Date  . Carpal tunnel release      left  . Sigmoidoscopy  03/11/2001  . Venous doppler  01/30/2004  . Tonsillectomy and adenoidectomy      "as a kid"  . Cataract extraction w/ intraocular lens  implant, bilateral    . Coronary angioplasty with stent placement  08/2011    "1"  . Retinopathy surgery  2000's    "laser; both eyes"  . Wrist fusion  1976    right  . Anterior fusion cervical spine  2010    "C spine; Dr. Ronnald Ramp"  . Ankle fracture surgery  1976; ?date    LEFT:  fused; removed bulk of hardware  . Heel spur surgery  ~ 2007    ? left  . Irrigation and debridement knee  02/27/2012    Procedure: IRRIGATION AND  DEBRIDEMENT KNEE;  Surgeon: Augustin Schooling, MD;  Location: [place];  Service: Orthopedics;  Laterality: Right;  irrigation and drainage right knee septic bursitis  . Eye surgery  2008    cataract removal, cautery  . Coronary artery bypass graft  1998    CABG X5  . Fracture surgery      foot broken 1976  . Pacemaker insertion  08/08/2013    Biventricular pacemaker: [employer] RF model PM3242 (serial number [id number])   Family History  Problem Relation Age of Onset  . Heart disease Mother     Coronary Artery Disease  . Heart disease Father     Coronary Artery Disease  . Heart disease Paternal Grandmother     Coronary Artery Disease  . Colon cancer Neg Hx   . Diabetes Other      Grandmother   History  Substance Use Topics  . Smoking status: Never Smoker   . Smokeless tobacco: Never Used  . Alcohol Use: No    Review of Systems  Constitutional: Negative for fever.  Respiratory: Positive for cough and shortness of breath.   Cardiovascular: Positive for chest pain.  All other systems reviewed and are negative.      Allergies  Sildenafil; Percocet; and Contrast media  Home Medications   Current Outpatient Rx  Name  Route  Sig  Dispense  Refill  . albuterol (PROVENTIL HFA;VENTOLIN HFA) 108 (90 BASE) MCG/ACT inhaler   Inhalation   Inhale 2 puffs into the lungs every 6 (six) hours as needed for wheezing or shortness of breath.   1 Inhaler   11   . albuterol (PROVENTIL) (2.5 MG/3ML) 0.083% nebulizer solution   Nebulization   Take 2.5 mg by nebulization 2 (two) times daily as needed for wheezing or shortness of breath.          [name] aspirin EC 81 MG tablet   Oral   Take 81 mg by mouth at bedtime.         [name] atorvastatin (LIPITOR) 40 MG tablet      TAKE 1 TABLET BY MOUTH EVERY DAY   30 tablet   0   . budesonide (PULMICORT) 0.25 MG/2ML nebulizer solution   Nebulization   Take 0.25 mg by nebulization daily as needed (shortness of breath).          . calcitRIOL (ROCALTROL) 0.25 MCG capsule   Oral   Take 1 capsule (0.25 mcg total) by mouth daily.   30 capsule   11   . carvedilol (COREG) 3.125 MG tablet   Oral   Take 1 tablet (3.125 mg total) by mouth 2 (two) times daily with a meal.   60 tablet   5   . clopidogrel (PLAVIX) 75 MG tablet   Oral   Take 75 mg by mouth daily with breakfast.         . co-enzyme Q-10 30 MG capsule   Oral   Take 30 mg by mouth daily at 2 PM daily at 2 PM.          . docusate sodium (COLACE) 100 MG capsule   Oral   Take 100 mg by mouth 2 (two) times daily as needed for constipation.         . gabapentin (NEURONTIN) 600 MG tablet      TAKE 1 TABLET BY MOUTH THREE TIMES DAILY   90 tablet   0   .  HYDROcodone-acetaminophen (NORCO) 10-325 MG per tablet  Oral   Take 1-2 tablets by mouth every 4 (four) hours as needed for moderate pain.   150 tablet   0   . insulin NPH (HUMULIN N,NOVOLIN N) 100 UNIT/ML injection   Subcutaneous   Inject 70 Units into the skin daily before breakfast.   2 vial   2   . ipratropium (ATROVENT) 0.02 % nebulizer solution   Nebulization   Take 500 mcg by nebulization every 6 (six) hours as needed for wheezing or shortness of breath.          . isosorbide mononitrate (IMDUR) 30 MG 24 hr tablet      TAKE 1 TABLET BY MOUTH EVERY DAY   30 tablet   0   . losartan (COZAAR) 50 MG tablet   Oral   Take 0.5 tablets (25 mg total) by mouth daily.   30 tablet   5   . nitroGLYCERIN (NITROSTAT) 0.4 MG SL tablet   Sublingual   Place 1 tablet (0.4 mg total) under the tongue every 5 (five) minutes as needed for chest pain.   25 tablet   5   . OVER THE COUNTER MEDICATION   Topical   Apply 1 application topically daily as needed. Anti fungal cream         . OVER THE COUNTER MEDICATION   Both Eyes   Place 1 drop into both eyes daily as needed (dry eyes). [facility] eye drops         . pantoprazole (PROTONIX) 40 MG tablet      TAKE 1 TABLET BY MOUTH EVERY DAY   30 tablet   0   . potassium chloride SA (K-DUR,KLOR-CON) 20 MEQ tablet   Oral   Take 20 mEq by mouth 2 (two) times daily.         . silodosin (RAPAFLO) 8 MG CAPS capsule   Oral   Take 8 mg by mouth at bedtime.          . torsemide (DEMADEX) 20 MG tablet      TAKE 3 TABLETS BY MOUTH EVERY MORNING AND 3 TABLETS BY MOUTH IN THE AFTERNOON AROUND 2 PM   180 tablet   1    BP 164/81  Pulse 107  Resp 20  SpO2 97% Physical Exam  Nursing note and vitals reviewed. Constitutional: He is oriented to person, place, and time. He appears well-developed and well-nourished. He appears distressed.  HENT:  Head: Normocephalic and atraumatic.  Mouth/Throat: Oropharynx is clear and moist.   Eyes: Conjunctivae are normal. Pupils are equal, round, and reactive to light. No scleral icterus.  Neck: Neck supple.  Cardiovascular: Normal rate, regular rhythm, normal heart sounds and intact distal pulses.   No murmur heard. Pulmonary/Chest: No stridor. He is in respiratory distress. He has decreased breath sounds (Air movement improved once on BiPAP). He has no wheezes. He has rales (Coarse breath sounds throughout.).  Abdominal: Soft. He exhibits no distension. There is no tenderness.  Musculoskeletal: Normal range of motion. He exhibits edema (2+ right lower extremity, 1+ left lower extremity).  Neurological: He is alert and oriented to person, place, and time.  Skin: Skin is warm and dry. No rash noted.  Psychiatric: He has a normal mood and affect. His behavior is normal.    ED Course  CRITICAL CARE Performed by: Serita Grit DAVID III Authorized by: Serita Grit DAVID III Total critical care time: 40 minutes Critical care time was exclusive of separately billable procedures and treating other patients.  Critical care was necessary to treat or prevent imminent or life-threatening deterioration of the following conditions: respiratory failure. Critical care was time spent personally by me on the following activities: development of treatment plan with patient or surrogate, discussions with consultants, evaluation of patient's response to treatment, examination of patient, obtaining history from patient or surrogate, ordering and performing treatments and interventions, ordering and review of laboratory studies, ordering and review of radiographic studies, pulse oximetry, re-evaluation of patient's condition and review of old charts.   (including critical care time) Labs Review Labs Reviewed  BASIC METABOLIC PANEL - Abnormal; Notable for the following:    Glucose, Bld 158 (*)    Creatinine, Ser 1.42 (*)    GFR calc non Af Amer 49 (*)    GFR calc Af Amer 56 (*)    All other  components within normal limits  PROTIME-INR - Abnormal; Notable for the following:    Prothrombin Time 11.5 (*)    All other components within normal limits  I-STAT ARTERIAL BLOOD GAS, ED - Abnormal; Notable for the following:    pCO2 arterial 47.4 (*)    Bicarbonate 28.7 (*)    Acid-Base Excess 3.0 (*)    All other components within normal limits  CBC WITH DIFFERENTIAL  LACTIC ACID, PLASMA  TROPONIN I  BLOOD GAS, ARTERIAL   Imaging Review Dg Chest Port 1 View  10/25/2013   CLINICAL DATA:  Shortness of breath.  EXAM: PORTABLE CHEST - 1 VIEW  COMPARISON:  August 18, 2013.  FINDINGS: Status post coronary artery bypass graft. Left-sided pacemaker is unchanged in position. Increased diffuse interstitial densities are noted bilaterally concerning for worsening edema or less likely pneumonia. No pneumothorax or pleural effusion is noted.  IMPRESSION: Increased bilateral interstitial densities most consistent with worsening pulmonary edema or less likely pneumonia.   Electronically Signed   By: Sabino Dick M.D.   On: 10/25/2013 09:05  All radiology studies independently viewed by me.      EKG Interpretation   Date/Time:  Saturday October 25 2013 08:31:27 EDT Ventricular Rate:  123 PR Interval:  88 QRS Duration: 150 QT Interval:  372 QTC Calculation: 532 R Axis:   -76 Text Interpretation:  Atrial-sensed ventricular-paced rhythm Biventricular  pacemaker detected compared to prior, rate has increased.  Confirmed by  [facility]  MD, TREY [id number]) on 10/25/2013 10:11:00 AM      MDM   Final diagnoses:  Acute on chronic combined systolic and diastolic heart failure  Acute respiratory failure    68 yo male presenting with respiratory distress.  Likely CHF with component of COPD.  Required BiPap on arrival, which, along with albuterol, significantly improved work of breathing.  No cough, fevers, or leukocytosis to suggest pneumonia.  Given IV lasix and IV solumedrol.  I discussed his EKG with  Dr. Percival Spanish (Cardiology).  Pt has dual chamber pacemaker and pacer spikes clearly seen on cardiac monitor (although not as well visualized on EKG). Admit to [facility] unit.      Houston Siren III, MD 10/25/13 2101

## 2013-10-25 NOTE — ED Notes (Signed)
Pt mask adjusted and sat up in the bed. Given a pillow for his arm to rest. Wife remains at the bedside.

## 2013-10-25 NOTE — ED Notes (Signed)
Taken off per pt. request, placed on 4 lpm n/c, RN @ bedside.

## 2013-10-25 NOTE — ED Notes (Signed)
Admitting Physician at the bedside.  

## 2013-10-25 NOTE — H&P (Signed)
[facility] History and Physical  Mark French CHE:[id number] DOB: Nov 21, 1945 DOA: 10/25/2013   PCP: [name], MD   Chief Complaint: Shortness of breath chest pain  HPI:  68 year old male with a history of diabetes mellitus, hypertension, CKD stage III, chronic systolic HF with EF of 53%, CVA, PVD, complete heart block s/p pacemaker placement and CAD who was admitted on 08/18/13 for weakness and hypotension returns because of sudden onset of shortness of breath that began on the morning of admission. The patient was discharged from the hospital on 08/20/2012 after his diuretics were adjusted because of overdiuresis. The patient was also recently admitted from 08/07/2013 to 08/12/2013 during which time he had a heart catheterization showing three-vessel disease. Permanent pacemaker was placed for complete heart block during that admission.  He presented today because of shortness of breath and chest pain that began around 8 AM. He denies any fevers, chills, nausea, vomiting, diarrhea, abdominal pain, hematochezia, melena, dysuria, hematuria, abdominal pain. Chest x-ray showed increased interstitial markings. The patient was placed on BiPAP initially, but has been weaned off to 4 L nasal cannula with suction saturation of 96%. The patient has not been extremely compliant with fluid restriction at home. The patient states that he last weighed himself one week prior to this admission and it was 230 pounds (104.2kg). His dry weight at the time of discharge on 08/20/2013 101.1 kg. He endorses compliance with his torsemide.   In the ED, the patient was found to have serum creatinine 1.42, WBC 10.0. He was given 40 mg of intravenous furosemide & soluMedrol 125 mg IV. His heart rate improved from 120s to low 100s. ProBNP was 36. EKG shows sinus tachycardia.  Assessment/Plan: Acute on chronic systolic and diastolic CHF  -Echocardiogram on 08/09/2013 showed EF 61%, grade 1 diastolic dysfunction   -Start furosemide 80 mg IV every 8 hours  -Strict I.'s and Os -Daily weights  -Fluid restrict  -Continue losartan and carvedilol -08/20/2013 weight 101.1kg -10/25/13 estimated weight 104.3kg Acute respiratory failure  -Secondary to CHF exacerbation  -Supplemental oxygen  Diabetes mellitus type 2  -Hemoglobin A1c  -Half home dose of  NPH CKD stage III  -Baseline creatinine 1.1-1.5  -Continue to monitor with diuresis  CAD with history of MI  -Heart catheterization on 08/07/2013 showed 3 vessel disease--> medical management  -Continue aspirin and Plavix  -Continue carvedilol  Hyperlipidemia -Continues statin Atypical chest pain -Cycle troponin     Past Medical History  Diagnosis Date  . CORONARY ARTERY DISEASE     a. s/p CABG 1998. b.  08/2011: inferior STEMI.  LHC 09/07/11: Severe 3v CAD, LIMA-LAD patent, SVG-RCA chronically occluded, SVG-circumflex chronically occluded, SVG-diagonal 95%. PCI: Promus DES to the SVG-diagonal.  Procedure c/b transient CHB req CPR and TTVP. c. Myoview 11/2011: scar but no ischemia.   [name] DIABETES MELLITUS, TYPE I   . HYPERTENSION   . 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 HYPERCHOLESTEROLEMIA   . CERVICAL RADICULOPATHY, LEFT   . PERIPHERAL NEUROPATHY   . Chronic systolic heart failure     Echocardiogram 08/09/13:Cavity size normal, EF 40%, inferior hypokinesis, grade 1 diastolic dysfunction, paradoxical septal motion, mild MR, mild LAE, no PFO, PAS 51, moderate pulm HTN  . ED (erectile dysfunction)   . Hypogonadism male   . GI bleed 2012    Multiple Duodenal ulcer   . Obesity   . Legally blind     bilaterally  . Stroke 65    age 36 months; residual right sided  weakness  . History of blood transfusion 2012  . Charcot foot due to diabetes mellitus     chronic pain  . Osteoarthritis     right hip and shoulder  . COPD (chronic obstructive pulmonary disease)   . 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 Hiatal  hernia   . Chronic stasis dermatitis     Chronic stasis changes.  . CKD (chronic kidney disease), stage III   . Syncope     a. 07/2013: suspected due to hypotension.  . Biventricular cardiac pacemaker in situ     a. [address] [employer] [place] RF model [id number] (serial number [id number])  . Complete heart block     a. s/p pacemaker placement on 08/2013 admission   Past Surgical History  Procedure Laterality Date  . Carpal tunnel release      left  . Sigmoidoscopy  03/11/2001  . Venous doppler  01/30/2004  . Tonsillectomy and adenoidectomy      "as a kid"  . Cataract extraction w/ intraocular lens  implant, bilateral    . Coronary angioplasty with stent placement  08/2011    "1"  . Retinopathy surgery  2000's    "laser; both eyes"  . Wrist fusion  1976    right  . Anterior fusion cervical spine  2010    "C spine; Dr. Ronnald Ramp"  . Ankle fracture surgery  1976; ?date    LEFT:  fused; removed bulk of hardware  . Heel spur surgery  ~ 2007    ? left  . Irrigation and debridement knee  02/27/2012    Procedure: IRRIGATION AND DEBRIDEMENT KNEE;  Surgeon: Augustin Schooling, MD;  Location: [place];  Service: Orthopedics;  Laterality: Right;  irrigation and drainage right knee septic bursitis  . Eye surgery  2008    cataract removal, cautery  . Coronary artery bypass graft  1998    CABG X5  . Fracture surgery      foot broken 1976  . Pacemaker insertion  08/08/2013    Biventricular pacemaker: [place] RF model [id number] (serial number [id number])   Social History:  reports that he has never smoked. He has never used smokeless tobacco. He reports that he does not drink alcohol or use illicit drugs.   Family History  Problem Relation Age of Onset  . Heart disease Mother     Coronary Artery Disease  . Heart disease Father     Coronary Artery Disease  . Heart disease Paternal Grandmother     Coronary Artery Disease  . Colon cancer Neg Hx   . Diabetes Other     Grandmother      Allergies  Allergen Reactions  . Sildenafil Nausea Only and Other (See Comments)    "took it once; heart went to pieces; never took it again"  . Percocet [Oxycodone-Acetaminophen] Other (See Comments)    Unknown reaction-possible altered mental state-per wife.    . Contrast Media [Iodinated Diagnostic Agents] Nausea Only      Prior to Admission medications   Medication Sig Start Date End Date Taking? Authorizing Provider  albuterol (PROVENTIL HFA;VENTOLIN HFA) 108 (90 BASE) MCG/ACT inhaler Inhale 2 puffs into the lungs every 6 (six) hours as needed for wheezing or shortness of breath. 10/14/13  Yes [name], MD  albuterol (PROVENTIL) (2.5 MG/3ML) 0.083% nebulizer solution Take 2.5 mg by nebulization 2 (two) times daily as needed for wheezing or shortness of breath.    Yes Historical  Provider, MD  aspirin EC 81 MG tablet Take 81 mg by mouth at bedtime.   Yes Historical Provider, MD  atorvastatin (LIPITOR) 40 MG tablet TAKE 1 TABLET BY MOUTH EVERY DAY 10/01/13  Yes Sherren Mocha, MD  budesonide (PULMICORT) 0.25 MG/2ML nebulizer solution Take 0.25 mg by nebulization daily as needed (shortness of breath).    Yes Historical Provider, MD  calcitRIOL (ROCALTROL) 0.25 MCG capsule Take 1 capsule (0.25 mcg total) by mouth daily. 08/22/13  Yes [name], MD  carvedilol (COREG) 3.125 MG tablet Take 1 tablet (3.125 mg total) by mouth 2 (two) times daily with a meal. 08/20/13  Yes Brittainy Simmons, PA-C  clopidogrel (PLAVIX) 75 MG tablet Take 75 mg by mouth daily with breakfast.   Yes Historical Provider, MD  co-enzyme Q-10 30 MG capsule Take 30 mg by mouth daily at 2 PM daily at 2 PM.    Yes Historical Provider, MD  docusate sodium (COLACE) 100 MG capsule Take 100 mg by mouth 2 (two) times daily as needed for constipation.   Yes Historical Provider, MD  gabapentin (NEURONTIN) 600 MG tablet TAKE 1 TABLET BY MOUTH THREE TIMES DAILY 10/01/13  Yes [name], MD  HYDROcodone-acetaminophen  (NORCO) 10-325 MG per tablet Take 1-2 tablets by mouth every 4 (four) hours as needed for moderate pain. 10/13/13  Yes [name], MD  insulin NPH (HUMULIN N,NOVOLIN N) 100 UNIT/ML injection Inject 70 Units into the skin daily before breakfast. 07/14/13  Yes [name], MD  ipratropium (ATROVENT) 0.02 % nebulizer solution Take 500 mcg by nebulization every 6 (six) hours as needed for wheezing or shortness of breath.    Yes Historical Provider, MD  isosorbide mononitrate (IMDUR) 30 MG 24 hr tablet TAKE 1 TABLET BY MOUTH EVERY DAY   Yes Sherren Mocha, MD  losartan (COZAAR) 50 MG tablet Take 0.5 tablets (25 mg total) by mouth daily. 08/20/13  Yes Brittainy Simmons, PA-C  nitroGLYCERIN (NITROSTAT) 0.4 MG SL tablet Place 1 tablet (0.4 mg total) under the tongue every 5 (five) minutes as needed for chest pain. 10/29/12  Yes Sherren Mocha, MD  OVER THE COUNTER MEDICATION Apply 1 application topically daily as needed. Anti fungal cream   Yes Historical Provider, MD  OVER THE COUNTER MEDICATION Place 1 drop into both eyes daily as needed (dry eyes). [facility] eye drops   Yes Historical Provider, MD  pantoprazole (PROTONIX) 40 MG tablet TAKE 1 TABLET BY MOUTH EVERY DAY 10/20/13  Yes [name], MD  potassium chloride SA (K-DUR,KLOR-CON) 20 MEQ tablet Take 20 mEq by mouth 2 (two) times daily.   Yes Historical Provider, MD  silodosin (RAPAFLO) 8 MG CAPS capsule Take 8 mg by mouth at bedtime.    Yes Historical Provider, MD  torsemide (DEMADEX) 20 MG tablet TAKE 3 TABLETS BY MOUTH EVERY MORNING AND 3 TABLETS BY MOUTH IN THE AFTERNOON AROUND 2 PM 09/01/13  Yes Sherren Mocha, MD    Review of Systems:  Constitutional:  No weight loss, night sweats, Fevers, chills, fatigue.  Head&Eyes: No headache.  No vision loss.  No eye pain or scotoma ENT:  No Difficulty swallowing,Tooth/dental problems,Sore throat,  No ear ache, post nasal drip,  Cardio-vascular:  No chest pain, Orthopnea,  dizziness, palpitations  GI:   No  abdominal pain, nausea, vomiting, diarrhea, loss of appetite, hematochezia, melena, heartburn, indigestion, Resp:  No shortness of breath with exertion or at rest.  No coughing up of blood  Skin:  no rash or lesions.  GU:  no dysuria, change in color of urine, no urgency or frequency. No flank pain.  Musculoskeletal:  No joint pain or swelling. No decreased range of motion. Complains of chronic back pain  Psych:  No change in mood or affect. No depression or anxiety. Neurologic: No headache, no dysesthesia, no focal weakness, no vision loss. No syncope  Physical Exam: Filed Vitals:   10/25/13 1000 10/25/13 1030 10/25/13 1048 10/25/13 1100  BP: 159/74 147/66 137/101 122/69  Pulse: 100 98 96 102  Resp: 23 25 22 30   SpO2: 99% 100% 100% 100%   General:  A&O x 3, NAD, nontoxic, pleasant/cooperative Head/Eye: No conjunctival hemorrhage, no icterus, [place]/AT, No nystagmus ENT:  No icterus,  No thrush, good dentition, no pharyngeal exudate Neck:  No masses, no lymphadenpathy CV:  RRR, no rub, no gallop, no S3 Lung:  Bilateral crackles. Minimal basilar wheezing. Good air movement. Abdomen: soft/NT, +BS, nondistended, no peritoneal signs Ext: No cyanosis, No rashes, No petechiae, No lymphangitis, 2+ LE edema   Labs on Admission:  Basic Metabolic Panel:  Recent Labs Lab 10/25/13 0847  NA 137  K 3.7  CL 96  CO2 26  GLUCOSE 158*  BUN 20  CREATININE 1.42*  CALCIUM 9.6   Liver Function Tests: No results found for this basename: AST, ALT, ALKPHOS, BILITOT, PROT, ALBUMIN,  in the last 168 hours No results found for this basename: LIPASE, AMYLASE,  in the last 168 hours No results found for this basename: AMMONIA,  in the last 168 hours CBC:  Recent Labs Lab 10/25/13 0847  WBC 10.0  NEUTROABS 6.5  HGB 13.3  HCT 41.5  MCV 85.6  PLT 191   Cardiac Enzymes:  Recent Labs Lab 10/25/13 0847  TROPONINI <0.30   BNP: No components found with this basename: POCBNP,   CBG: No results found for this basename: GLUCAP,  in the last 168 hours  Radiological Exams on Admission: Dg Chest Port 1 View  10/25/2013   CLINICAL DATA:  Shortness of breath.  EXAM: PORTABLE CHEST - 1 VIEW  COMPARISON:  August 18, 2013.  FINDINGS: Status post coronary artery bypass graft. Left-sided pacemaker is unchanged in position. Increased diffuse interstitial densities are noted bilaterally concerning for worsening edema or less likely pneumonia. No pneumothorax or pleural effusion is noted.  IMPRESSION: Increased bilateral interstitial densities most consistent with worsening pulmonary edema or less likely pneumonia.   Electronically Signed   By: Sabino Dick M.D.   On: 10/25/2013 09:05    EKG: Independently reviewed. Sinus tachycardia, RBBB    Time spent:70 minutes Code Status:   FULL Family Communication:   Wife at bedside   Reneka Nebergall, DO  [facility] Pager [phone]  If 7PM-7AM, please contact night-coverage [email or web address] Password [facility] 10/25/2013, 11:53 AM

## 2013-10-25 NOTE — Progress Notes (Signed)
Pt. placed back on bipap(vision from dept.)after moving from ED stretcher to bed becoming moderately short of breath in 2C-05, RN @ bedside, RT covering unit made aware.

## 2013-10-25 NOTE — ED Notes (Signed)
Pt. arrived severe shortness off breath on neb/[facility], placed on bipap via Servo-I, LG/FFM,  NIV/PC-10/5/bur-15/pt.-31, Vt's 500's, continued neb. thru bipap, 40%fi02, ABG to be drawn, RT to monitor.

## 2013-10-25 NOTE — Progress Notes (Signed)
ANTICOAGULATION CONSULT NOTE - Initial Consult  Pharmacy Consult for Heparin Indication: chest pain/ACS  Allergies  Allergen Reactions  . Sildenafil Nausea Only and Other (See Comments)    "took it once; heart went to pieces; never took it again"  . Percocet [Oxycodone-Acetaminophen] Other (See Comments)    Unknown reaction-possible altered mental state-per wife.    . Contrast Media [Iodinated Diagnostic Agents] Nausea Only    Patient Measurements: Height: 5\' 1"  (154.9 cm) Weight: 231 lb 9.6 oz (105.053 kg) IBW/kg (Calculated) : 52.3 Heparin Dosing Weight:   Vital Signs: Temp: 97.5 F (36.4 C) (03/21 1432) Temp src: Oral (03/21 2000) BP: 126/60 mmHg (03/21 2000) Pulse Rate: 89 (03/21 2000)  Labs:  Recent Labs  10/25/13 0847 10/25/13 1730  HGB 13.3  --   HCT 41.5  --   PLT 191  --   LABPROT 11.5*  --   INR 0.85  --   CREATININE 1.42*  --   TROPONINI <0.30 2.84*    Estimated Creatinine Clearance: 50.3 ml/min (by C-G formula based on Cr of 1.42).   Medical History: Past Medical History  Diagnosis Date  . CORONARY ARTERY DISEASE     a. s/p CABG 1998. b.  08/2011: inferior STEMI.  LHC 09/07/11: Severe 3v CAD, LIMA-LAD patent, SVG-RCA chronically occluded, SVG-circumflex chronically occluded, SVG-diagonal 95%. PCI: Promus DES to the SVG-diagonal.  Procedure c/b transient CHB req CPR and TTVP. c. Myoview 11/2011: scar but no ischemia.   [name] DIABETES MELLITUS, TYPE I   . HYPERTENSION   . 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 HYPERCHOLESTEROLEMIA   . CERVICAL RADICULOPATHY, LEFT   . PERIPHERAL NEUROPATHY   . Chronic systolic heart failure     Echocardiogram 08/09/13:Cavity size normal, EF 40%, inferior hypokinesis, grade 1 diastolic dysfunction, paradoxical septal motion, mild MR, mild LAE, no PFO, PAS 51, moderate pulm HTN  . ED (erectile dysfunction)   . Hypogonadism male   . GI bleed 2012    Multiple Duodenal ulcer   . Obesity   . Legally blind    bilaterally  . Stroke 45    age 47 months; residual right sided weakness  . History of blood transfusion 2012  . Charcot foot due to diabetes mellitus     chronic pain  . Osteoarthritis     right hip and shoulder  . COPD (chronic obstructive pulmonary disease)   . 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 Hiatal hernia   . Chronic stasis dermatitis     Chronic stasis changes.  . CKD (chronic kidney disease), stage III   . Syncope     a. 07/2013: suspected due to hypotension.  . Biventricular cardiac pacemaker in situ     a. [address] [employer] [place] RF model [id number] (serial number [id number])  . Complete heart block     a. s/p pacemaker placement on 08/2013 admission    Medications:  Scheduled:  . aspirin EC  81 mg Oral QHS  . atorvastatin  40 mg Oral q1800  . calcitRIOL  0.25 mcg Oral Daily  . carvedilol  3.125 mg Oral BID WC  . [START ON 10/26/2013] clopidogrel  75 mg Oral Q breakfast  . docusate sodium  100 mg Oral BID  . enoxaparin (LOVENOX) injection  40 mg Subcutaneous Q24H  . furosemide  80 mg Intravenous 3 times per day  . gabapentin  600 mg Oral TID  . insulin aspart  0-15 Units Subcutaneous TID WC  . [START ON  10/26/2013] insulin NPH Human  35 Units Subcutaneous QAC breakfast  . isosorbide mononitrate  30 mg Oral Daily  . losartan  25 mg Oral Daily  . pantoprazole  40 mg Oral Daily  . potassium chloride SA  20 mEq Oral BID  . sodium chloride  3 mL Intravenous Q12H  . tamsulosin  0.4 mg Oral Daily    Assessment: 68yo male with ACS, to start Heparin.  Pt with (+)hx of GIB in 2012.  Hg and pltc are wnl; no baseline coags have been drawn.  (+)Troponin.  He received Lovenox 40mg  SQ at 1730, therefore will not give a bolus.  Goal of Therapy:  Heparin level 0.3-0.7 units/ml Monitor platelets by anticoagulation protocol: Yes   Plan:  1-  Heparin 1050 units/hr 2-  Heparin level and CBC 6hr 3-  Daily HL, CBC  Gracy Bruins, [facility]

## 2013-10-25 NOTE — ED Notes (Signed)
Per EMS, Patient complained of Shortness of Breath and Chest pain at the sight of his indwelling pacemaker. Per EMS, upon arrival patient's main complaint was a chronic neck pain and shortness of breath. During route, patient became progressively worse complaining of shortness of breath. Patient complaining of leg swelling that "has worsen." Lungs sounds initially clear, but changed during route to wheezing. EKG showed on demand pacing. Patient self-administered one nitro before EMS arrived. Vitals per EMS: 180/80, 98% on RA.

## 2013-10-25 NOTE — Progress Notes (Signed)
Pt admitted to unit, from ED accompanied by RT and EDRN. Pt's presentation was SOB, breathing with ABD muscles, agitated on 4L [place]. RT brought in a bipap, placed PT on. Pt is still very SOB vitals at this time are 175/88, 112, 92% and 33. MD called for some sedative.

## 2013-10-25 NOTE — Progress Notes (Signed)
CRITICAL VALUE ALERT  Critical value received:  Triponin 2.84  Date of notification:  10/25/13  Time of notification:  0175  Critical value read back:yes  Nurse who received alert:  Novella Rob  MD notified (1st page):  Dr. Carles Collet  Time of first page:  1656  MD notified (2nd page):M.[facility]  Time of second page:1900  Responding MD:  M.LYNCH  Time MD responded:  1025

## 2013-10-26 DIAGNOSIS — N179 Acute kidney failure, unspecified: Secondary | ICD-10-CM | POA: Diagnosis present

## 2013-10-26 DIAGNOSIS — M79609 Pain in unspecified limb: Secondary | ICD-10-CM

## 2013-10-26 DIAGNOSIS — R609 Edema, unspecified: Secondary | ICD-10-CM

## 2013-10-26 DIAGNOSIS — I214 Non-ST elevation (NSTEMI) myocardial infarction: Secondary | ICD-10-CM

## 2013-10-26 DIAGNOSIS — R799 Abnormal finding of blood chemistry, unspecified: Secondary | ICD-10-CM

## 2013-10-26 LAB — BASIC METABOLIC PANEL
BUN: 27 mg/dL — ABNORMAL HIGH (ref 6–23)
CALCIUM: 8.9 mg/dL (ref 8.4–10.5)
CO2: 27 mEq/L (ref 19–32)
Chloride: 99 mEq/L (ref 96–112)
Creatinine, Ser: 1.28 mg/dL (ref 0.50–1.35)
GFR, EST AFRICAN AMERICAN: 64 mL/min — AB (ref >90)
GFR, EST NON AFRICAN AMERICAN: 55 mL/min — AB (ref >90)
GLUCOSE: 191 mg/dL — AB (ref 70–99)
Potassium: 4.3 mEq/L (ref 3.7–5.3)
SODIUM: 141 meq/L (ref 137–147)

## 2013-10-26 LAB — CBC
HEMATOCRIT: 35.3 % — AB (ref 39.0–52.0)
Hemoglobin: 11.2 g/dL — ABNORMAL LOW (ref 13.0–17.0)
MCH: 26.7 pg (ref 26.0–34.0)
MCHC: 31.7 g/dL (ref 30.0–36.0)
MCV: 84.2 fL (ref 78.0–100.0)
PLATELETS: 221 10*3/uL (ref 150–400)
RBC: 4.19 MIL/uL — ABNORMAL LOW (ref 4.22–5.81)
RDW: 14.4 % (ref 11.5–15.5)
WBC: 12.1 10*3/uL — ABNORMAL HIGH (ref 4.0–10.5)

## 2013-10-26 LAB — HEMOGLOBIN A1C
Hgb A1c MFr Bld: 7.1 % — ABNORMAL HIGH (ref <5.7)
MEAN PLASMA GLUCOSE: 157 mg/dL — AB (ref <117)

## 2013-10-26 LAB — TROPONIN I: Troponin I: 5.32 ng/mL (ref <0.30)

## 2013-10-26 LAB — HEPARIN LEVEL (UNFRACTIONATED)
HEPARIN UNFRACTIONATED: 0.54 [IU]/mL (ref 0.30–0.70)
Heparin Unfractionated: 0.55 IU/mL (ref 0.30–0.70)

## 2013-10-26 LAB — GLUCOSE, CAPILLARY
Glucose-Capillary: 167 mg/dL — ABNORMAL HIGH (ref 70–99)
Glucose-Capillary: 91 mg/dL (ref 70–99)

## 2013-10-26 MED ORDER — SODIUM CHLORIDE 0.9 % IJ SOLN
3.0000 mL | INTRAMUSCULAR | Status: DC | PRN
Start: 2013-10-26 — End: 2013-10-27

## 2013-10-26 MED ORDER — FUROSEMIDE 10 MG/ML IJ SOLN
80.0000 mg | Freq: Two times a day (BID) | INTRAMUSCULAR | Status: DC
  Administered 2013-10-26 – 2013-10-27 (×2): 80 mg via INTRAVENOUS
  Filled 2013-10-26 (×2): qty 8

## 2013-10-26 MED ORDER — GABAPENTIN 800 MG PO TABS
800.0000 mg | ORAL_TABLET | Freq: Three times a day (TID) | ORAL | Status: DC
  Administered 2013-10-26: 800 mg via ORAL
  Filled 2013-10-26 (×3): qty 1

## 2013-10-26 MED ORDER — OXYCODONE HCL 5 MG PO TABS: 5.0000 mg | ORAL_TABLET | ORAL | Status: DC | PRN

## 2013-10-26 MED ORDER — MORPHINE SULFATE ER 15 MG PO TBCR
15.0000 mg | EXTENDED_RELEASE_TABLET | Freq: Two times a day (BID) | ORAL | Status: DC
  Administered 2013-10-26 – 2013-10-27 (×3): 15 mg via ORAL
  Filled 2013-10-26 (×3): qty 1

## 2013-10-26 MED ORDER — SODIUM CHLORIDE 0.9 % IV SOLN: 250.0000 mL | INTRAVENOUS | Status: DC | PRN

## 2013-10-26 MED ORDER — GABAPENTIN 400 MG PO CAPS
400.0000 mg | ORAL_CAPSULE | Freq: Three times a day (TID) | ORAL | Status: DC
  Filled 2013-10-26 (×3): qty 1

## 2013-10-26 MED ORDER — PREDNISONE 50 MG PO TABS
60.0000 mg | ORAL_TABLET | ORAL | Status: AC
  Administered 2013-10-26: 60 mg via ORAL

## 2013-10-26 MED ORDER — SODIUM CHLORIDE 0.9 % IV SOLN: INTRAVENOUS | Status: DC

## 2013-10-26 MED ORDER — HYDROCODONE-ACETAMINOPHEN 10-325 MG PO TABS
1.0000 | ORAL_TABLET | ORAL | Status: DC | PRN
  Administered 2013-10-26: 2 via ORAL
  Administered 2013-10-27 – 2013-10-28 (×5): 1 via ORAL
  Administered 2013-10-28: 07:00:00 2 via ORAL
  Administered 2013-10-28: 1 via ORAL
  Administered 2013-10-29: 2 via ORAL
  Administered 2013-10-29: 1 via ORAL
  Administered 2013-10-29 – 2013-10-30 (×2): 2 via ORAL
  Filled 2013-10-26 (×2): qty 1
  Filled 2013-10-26 (×3): qty 2
  Filled 2013-10-26 (×3): qty 1
  Filled 2013-10-26 (×3): qty 2
  Filled 2013-10-26 (×2): qty 1

## 2013-10-26 MED ORDER — ASPIRIN 81 MG PO CHEW
81.0000 mg | CHEWABLE_TABLET | ORAL | Status: AC
  Administered 2013-10-27: 81 mg via ORAL
  Filled 2013-10-26: qty 1

## 2013-10-26 MED ORDER — PREDNISONE 50 MG PO TABS
60.0000 mg | ORAL_TABLET | ORAL | Status: AC
  Administered 2013-10-27: 60 mg via ORAL
  Filled 2013-10-26 (×2): qty 1

## 2013-10-26 MED ORDER — OXYCODONE HCL ER 10 MG PO T12A: 10.0000 mg | EXTENDED_RELEASE_TABLET | Freq: Two times a day (BID) | ORAL | Status: DC

## 2013-10-26 MED ORDER — DIPHENHYDRAMINE HCL 50 MG/ML IJ SOLN
25.0000 mg | INTRAMUSCULAR | Status: AC
  Administered 2013-10-27: 25 mg via INTRAVENOUS
  Filled 2013-10-26: qty 1

## 2013-10-26 MED ORDER — FAMOTIDINE 20 MG PO TABS
20.0000 mg | ORAL_TABLET | ORAL | Status: AC
  Administered 2013-10-26: 20 mg via ORAL
  Filled 2013-10-26: qty 1

## 2013-10-26 MED ORDER — GABAPENTIN 400 MG PO CAPS: 800.0000 mg | ORAL_CAPSULE | Freq: Three times a day (TID) | ORAL | Status: DC

## 2013-10-26 MED ORDER — SODIUM CHLORIDE 0.9 % IJ SOLN
3.0000 mL | Freq: Two times a day (BID) | INTRAMUSCULAR | Status: DC
  Administered 2013-10-27: 3 mL via INTRAVENOUS

## 2013-10-26 MED ORDER — GABAPENTIN 400 MG PO CAPS
800.0000 mg | ORAL_CAPSULE | Freq: Three times a day (TID) | ORAL | Status: DC
  Administered 2013-10-26 – 2013-10-27 (×2): 800 mg via ORAL
  Filled 2013-10-26 (×4): qty 2

## 2013-10-26 NOTE — Progress Notes (Signed)
ANTICOAGULATION CONSULT NOTE - Follow Up Consult  Pharmacy Consult for Heparin  Indication: chest pain/ACS  Allergies  Allergen Reactions  . Sildenafil Nausea Only and Other (See Comments)    "took it once; heart went to pieces; never took it again"  . Percocet [Oxycodone-Acetaminophen] Other (See Comments)    Unknown reaction-possible altered mental state-per wife.    . Contrast Media [Iodinated Diagnostic Agents] Nausea Only    Patient Measurements: Height: 5\' 1"  (154.9 cm) Weight: 227 lb 11.8 oz (103.3 kg) IBW/kg (Calculated) : 52.3  Vital Signs: Temp: 98.1 F (36.7 C) (03/22 0400) Temp src: Axillary (03/22 0400) BP: 117/48 mmHg (03/22 0600) Pulse Rate: 75 (03/22 0600)  Labs:  Recent Labs  10/25/13 0847 10/25/13 1730 10/26/13 0600  HGB 13.3  --  11.2*  HCT 41.5  --  35.3*  PLT 191  --  221  LABPROT 11.5*  --   --   INR 0.85  --   --   HEPARINUNFRC  --   --  0.54  CREATININE 1.42*  --   --   TROPONINI <0.30 2.84*  --     Estimated Creatinine Clearance: 49.8 ml/min (by C-G formula based on Cr of 1.42).   Medications:  Heparin 1050 units/hr  Assessment: 68 y/o M on heparin for NSTEMI. HL is 0.54. Other labs as above.   Goal of Therapy:  Heparin level 0.3-0.7 units/ml Monitor platelets by anticoagulation protocol: Yes   Plan:  -Continue heparin drip at 1050 units/hr -1400 HL to confirm -Daily CBC/HL -Monitor for bleeding  Narda Bonds 10/26/2013,7:19 AM

## 2013-10-26 NOTE — Progress Notes (Signed)
ANTICOAGULATION CONSULT NOTE - Follow Up Consult  Pharmacy Consult for Heparin  Indication: chest pain/ACS  Allergies  Allergen Reactions  . Sildenafil Nausea Only and Other (See Comments)    "took it once; heart went to pieces; never took it again"  . Percocet [Oxycodone-Acetaminophen] Other (See Comments)    Unknown reaction-possible altered mental state-per wife.    . Contrast Media [Iodinated Diagnostic Agents] Nausea Only    Patient Measurements: Height: 5\' 1"  (154.9 cm) Weight: 227 lb 11.8 oz (103.3 kg) IBW/kg (Calculated) : 52.3  Vital Signs: Temp: 98 F (36.7 C) (03/22 1630) Temp src: Oral (03/22 1630) BP: 97/46 mmHg (03/22 1630) Pulse Rate: 86 (03/22 1630)  Labs:  Recent Labs  10/25/13 0847 10/25/13 1730 10/26/13 0600 10/26/13 1246 10/26/13 1547  HGB 13.3  --  11.2*  --   --   HCT 41.5  --  35.3*  --   --   PLT 191  --  221  --   --   LABPROT 11.5*  --   --   --   --   INR 0.85  --   --   --   --   HEPARINUNFRC  --   --  0.54  --  0.55  CREATININE 1.42*  --  1.28  --   --   TROPONINI <0.30 2.84*  --  5.32*  --     Estimated Creatinine Clearance: 55.2 ml/min (by C-G formula based on Cr of 1.28).   Medications:  Heparin 1050 units/hr  Assessment: 68 y/o M on heparin for NSTEMI. Heparin is therapeutic on 1050 units/hr.  Noted plans for cardiac cath tomorrow.  Goal of Therapy:  Heparin level 0.3-0.7 units/ml Monitor platelets by anticoagulation protocol: Yes   Plan:  -Continue heparin drip at 1050 units/hr -Daily CBC and heparin level -Monitor for bleeding  [employer], Pharm.D., BCPS Clinical Pharmacist Pager [phone] 10/26/2013 5:07 PM

## 2013-10-26 NOTE — Progress Notes (Signed)
*  PRELIMINARY RESULTS* Vascular Ultrasound Lower extremity venous duplex has been completed.  Preliminary findings: no evidence of DVT  Landry Mellow, RDMS, RVT  10/26/2013, 1:29 PM

## 2013-10-26 NOTE — Progress Notes (Signed)
[facility] PROGRESS NOTE  KEMO SPRUCE UYQ:[id number] DOB: 11-25-1945 DOA: 10/25/2013 PCP: [name], MD  Assessment/Plan: Principal Problem:   NSTEMI (non-ST elevated myocardial infarction): Continue nitrates and heparin. For cardiac catheterization tomorrow. Discussed with cardiology about troponin increase Active Problems:   Chronic kidney disease, stage III (moderate): Creatinine actually improved with diuresis    Chest pain: Now chest pain-free. Non-STEMI.    Complete heart block   Chronic pain syndrome: From peripheral neuropathy. See below.    Acute on chronic combined systolic and diastolic heart failure: Diuresing,,   Acute respiratory failure: Secondary to CHF    DM (diabetes mellitus) type II controlled, neurological manifestation/peripheral neuropathy: Continue insulin at reduced dose plus sliding scale. His neuropathy is quite significant. He looks to be in chronic pain. He takes Percocet every 4 hours plus Neurontin 600 3 times a day and prednisone she has little relief.  We'll try increasing Neurontin to 800 3 times a day and monitor for sedation. In addition, we'll try MS Contin extended release every 12 hours    ARF (acute renal failure): Improved with diuresis  Code Status: Full code  Family Communication: Wife at the bedside.  Disposition Plan: Keep in step down unit. Cardiac catheterization tomorrow.   Consultants:  Cardiology  Procedures:  For cardiac catheterization tomorrow  Antibiotics:  None  HPI/Subjective: Patient doing okay. Reading still rough, but better. No chest pain.  Objective: Filed Vitals:   10/26/13 1227  BP: 110/49  Pulse: 79  Temp: 98.2 F (36.8 C)  Resp: 17    Intake/Output Summary (Last 24 hours) at 10/26/13 1442 Last data filed at 10/26/13 1231  Gross per 24 hour  Intake    243 ml  Output   2000 ml  Net  -1757 ml   Filed Weights   10/25/13 1434 10/26/13 0500  Weight: 105.053 kg (231 lb 9.6 oz) 103.3  kg (227 lb 11.8 oz)    Exam:   General:  Alert and oriented x3, mild distress secondary to chronic neuropathy pain  Cardiovascular: Regular rate and rhythm, G3-O7, 2/6 systolic ejection murmur  Respiratory: Decreased breath sounds bibasilar  Abdomen: Soft, nontender, nondistended, positive bowel sounds  Musculoskeletal: Signs of chronic peripheral neuropathy, decreased sensation, 1+ pitting edema   Data Reviewed: Basic Metabolic Panel:  Recent Labs Lab 10/25/13 0847 10/26/13 0600  NA 137 141  K 3.7 4.3  CL 96 99  CO2 26 27  GLUCOSE 158* 191*  BUN 20 27*  CREATININE 1.42* 1.28  CALCIUM 9.6 8.9   Liver Function Tests: No results found for this basename: AST, ALT, ALKPHOS, BILITOT, PROT, ALBUMIN,  in the last 168 hours No results found for this basename: LIPASE, AMYLASE,  in the last 168 hours No results found for this basename: AMMONIA,  in the last 168 hours CBC:  Recent Labs Lab 10/25/13 0847 10/26/13 0600  WBC 10.0 12.1*  NEUTROABS 6.5  --   HGB 13.3 11.2*  HCT 41.5 35.3*  MCV 85.6 84.2  PLT 191 221   Cardiac Enzymes:  Recent Labs Lab 10/25/13 0847 10/25/13 1730 10/26/13 1246  TROPONINI <0.30 2.84* 5.32*   BNP (last 3 results)  Recent Labs  08/07/13 1745 08/18/13 1232 10/25/13 0847  PROBNP 568.0* 608.1* 936.7*   CBG:  Recent Labs Lab 10/25/13 1743 10/26/13 0830  GLUCAP 215* 167*    Recent Results (from the past 240 hour(s))  MRSA PCR SCREENING     Status: None   Collection Time  10/25/13  3:04 PM      Result Value Ref Range Status   MRSA by PCR NEGATIVE  NEGATIVE Final   Comment:            The GeneXpert MRSA Assay (FDA     approved for NASAL specimens     only), is one component of a     comprehensive MRSA colonization     surveillance program. It is not     intended to diagnose MRSA     infection nor to guide or     monitor treatment for     MRSA infections.     Studies: Dg Chest Port 1 View  10/25/2013   CLINICAL  DATA:  Shortness of breath.  EXAM: PORTABLE CHEST - 1 VIEW  COMPARISON:  August 18, 2013.  FINDINGS: Status post coronary artery bypass graft. Left-sided pacemaker is unchanged in position. Increased diffuse interstitial densities are noted bilaterally concerning for worsening edema or less likely pneumonia. No pneumothorax or pleural effusion is noted.  IMPRESSION: Increased bilateral interstitial densities most consistent with worsening pulmonary edema or less likely pneumonia.   Electronically Signed   By: Sabino Dick M.D.   On: 10/25/2013 09:05    Scheduled Meds: . aspirin EC  81 mg Oral QHS  . atorvastatin  40 mg Oral q1800  . calcitRIOL  0.25 mcg Oral Daily  . carvedilol  3.125 mg Oral BID WC  . clopidogrel  75 mg Oral Q breakfast  . docusate sodium  100 mg Oral BID  . furosemide  80 mg Intravenous BID  . gabapentin  800 mg Oral TID  . insulin aspart  0-15 Units Subcutaneous TID WC  . insulin NPH Human  35 Units Subcutaneous QAC breakfast  . isosorbide mononitrate  30 mg Oral Daily  . losartan  25 mg Oral Daily  . morphine  15 mg Oral Q12H  . pantoprazole  40 mg Oral Daily  . potassium chloride SA  20 mEq Oral BID  . sodium chloride  3 mL Intravenous Q12H  . tamsulosin  0.4 mg Oral Daily   Continuous Infusions: . heparin 1,050 Units/hr (10/25/13 2111)    Principal Problem:   NSTEMI (non-ST elevated myocardial infarction) Active Problems:   Chronic kidney disease, stage III (moderate)   Chest pain   Complete heart block   Chronic pain syndrome   Acute on chronic combined systolic and diastolic heart failure   Acute respiratory failure   DM (diabetes mellitus) type II controlled, neurological manifestation   ARF (acute renal failure)    Time spent: 40 minutes    [place] [facility] Pager [phone]. If 7PM-7AM, please contact night-coverage at [email or web address], password [facility] 10/26/2013, 2:42 PM  LOS: 1 day

## 2013-10-26 NOTE — Progress Notes (Signed)
    Subjective:  Denies CP; dyspnea improving   Objective:  Filed Vitals:   10/26/13 0400 10/26/13 0500 10/26/13 0600 10/26/13 0640  BP: 94/42  117/48   Pulse: 66 67 75   Temp: 98.1 F (36.7 C)     TempSrc: Axillary     Resp: 19 19 19 22   Height:      Weight:  227 lb 11.8 oz (103.3 kg)    SpO2: 98% 98% 99%     Intake/Output from previous day:  Intake/Output Summary (Last 24 hours) at 10/26/13 0741 Last data filed at 10/25/13 2250  Gross per 24 hour  Intake    243 ml  Output   1800 ml  Net  -1557 ml    Physical Exam: Physical exam: Well-developed well-nourished in no acute distress.  Skin is warm and dry.  HEENT is normal.  Neck is supple.  Chest diminished BS bases Cardiovascular exam is regular rate and rhythm.  Abdominal exam nontender or distended. No masses palpated. Extremities show trace to 1+ edema, mild erythema, chronic skin changes. neuro grossly intact    Lab Results: Basic Metabolic Panel:  Recent Labs  10/25/13 0847  NA 137  K 3.7  CL 96  CO2 26  GLUCOSE 158*  BUN 20  CREATININE 1.42*  CALCIUM 9.6   CBC:  Recent Labs  10/25/13 0847 10/26/13 0600  WBC 10.0 12.1*  NEUTROABS 6.5  --   HGB 13.3 11.2*  HCT 41.5 35.3*  MCV 85.6 84.2  PLT 191 221   Cardiac Enzymes:  Recent Labs  10/25/13 0847 10/25/13 1730  TROPONINI <0.30 2.84*     Assessment/Plan:  1 non-ST elevation myocardial infarction-the patient has ruled in. He is not presently having chest pain and his dyspnea is improving. Continue aspirin, Plavix, heparin, beta blocker, statin and nitrates. We'll proceed with cardiac catheterization tomorrow if renal function stable. The risks and benefits were discussed and he agrees to proceed. Hold Lasix tomorrow morning. Premedicate for dye allergy. Follow renal function closely after procedure. 2 acute combined systolic/diastolic congestive heart failure-the patient had sudden onset of dyspnea/pulmonary edema. He is ruled in. He  is improving with diuresis. Change Lasix to 80 mg twice a day. Check echocardiogram for LV function. 3 chronic stage III kidney disease-follow renal function closely with diuresis. 4 hyperlipidemia-continue statin. 5 hypertension-continue present medications. 6 diabetes mellitus-follow CBG.  Mark French 10/26/2013, 7:41 AM

## 2013-10-27 ENCOUNTER — Encounter (HOSPITAL_COMMUNITY)
Admission: EM | Disposition: A | Discharge status: Hospice | Payer: Self-pay | Source: Home / Self Care | Attending: Internal Medicine

## 2013-10-27 DIAGNOSIS — G894 Chronic pain syndrome: Secondary | ICD-10-CM

## 2013-10-27 DIAGNOSIS — I2510 Atherosclerotic heart disease of native coronary artery without angina pectoris: Secondary | ICD-10-CM

## 2013-10-27 DIAGNOSIS — I059 Rheumatic mitral valve disease, unspecified: Secondary | ICD-10-CM

## 2013-10-27 HISTORY — PX: LEFT HEART CATHETERIZATION WITH CORONARY/GRAFT ANGIOGRAM: SHX5450

## 2013-10-27 LAB — CBC
HCT: 34.6 % — ABNORMAL LOW (ref 39.0–52.0)
Hemoglobin: 10.8 g/dL — ABNORMAL LOW (ref 13.0–17.0)
MCH: 26.5 pg (ref 26.0–34.0)
MCHC: 31.2 g/dL (ref 30.0–36.0)
MCV: 85 fL (ref 78.0–100.0)
PLATELETS: 181 10*3/uL (ref 150–400)
RBC: 4.07 MIL/uL — AB (ref 4.22–5.81)
RDW: 14.5 % (ref 11.5–15.5)
WBC: 11.5 10*3/uL — ABNORMAL HIGH (ref 4.0–10.5)

## 2013-10-27 LAB — GLUCOSE, CAPILLARY
GLUCOSE-CAPILLARY: 149 mg/dL — AB (ref 70–99)
GLUCOSE-CAPILLARY: 124 mg/dL — AB (ref 70–99)
GLUCOSE-CAPILLARY: 224 mg/dL — AB (ref 70–99)
Glucose-Capillary: 236 mg/dL — ABNORMAL HIGH (ref 70–99)
Glucose-Capillary: 105 mg/dL — ABNORMAL HIGH (ref 70–99)
Glucose-Capillary: 134 mg/dL — ABNORMAL HIGH (ref 70–99)
Glucose-Capillary: 207 mg/dL — ABNORMAL HIGH (ref 70–99)

## 2013-10-27 LAB — BASIC METABOLIC PANEL
BUN: 30 mg/dL — ABNORMAL HIGH (ref 6–23)
CALCIUM: 8.7 mg/dL (ref 8.4–10.5)
CO2: 29 meq/L (ref 19–32)
Chloride: 99 mEq/L (ref 96–112)
Creatinine, Ser: 1.3 mg/dL (ref 0.50–1.35)
GFR calc Af Amer: 63 mL/min — ABNORMAL LOW (ref >90)
GFR, EST NON AFRICAN AMERICAN: 54 mL/min — AB (ref >90)
GLUCOSE: 137 mg/dL — AB (ref 70–99)
POTASSIUM: 4.6 meq/L (ref 3.7–5.3)
SODIUM: 140 meq/L (ref 137–147)

## 2013-10-27 LAB — POCT ACTIVATED CLOTTING TIME: ACTIVATED CLOTTING TIME: 149 s

## 2013-10-27 LAB — HEPARIN LEVEL (UNFRACTIONATED): HEPARIN UNFRACTIONATED: 0.47 [IU]/mL (ref 0.30–0.70)

## 2013-10-27 SURGERY — LEFT HEART CATHETERIZATION WITH CORONARY/GRAFT ANGIOGRAM
Anesthesia: LOCAL

## 2013-10-27 MED ORDER — SODIUM CHLORIDE 0.9 % IV SOLN
INTRAVENOUS | Status: AC
  Administered 2013-10-27: 15:00:00 via INTRAVENOUS

## 2013-10-27 MED ORDER — HEPARIN (PORCINE) IN NACL 2-0.9 UNIT/ML-% IJ SOLN
INTRAMUSCULAR | Status: AC
  Filled 2013-10-27: qty 1000

## 2013-10-27 MED ORDER — PERFLUTREN LIPID MICROSPHERE
1.0000 mL | INTRAVENOUS | Status: DC | PRN
  Administered 2013-10-27: 2 mL via INTRAVENOUS
  Filled 2013-10-27: qty 10

## 2013-10-27 MED ORDER — ENOXAPARIN SODIUM 60 MG/0.6ML ~~LOC~~ SOLN
50.0000 mg | SUBCUTANEOUS | Status: DC
  Administered 2013-10-28 – 2013-10-30 (×2): 50 mg via SUBCUTANEOUS
  Filled 2013-10-27 (×5): qty 0.6

## 2013-10-27 MED ORDER — NITROGLYCERIN 0.2 MG/ML ON CALL CATH LAB
INTRAVENOUS | Status: AC
  Filled 2013-10-27: qty 1

## 2013-10-27 MED ORDER — FUROSEMIDE 10 MG/ML IJ SOLN
INTRAMUSCULAR | Status: AC
  Filled 2013-10-27: qty 4

## 2013-10-27 MED ORDER — LIDOCAINE HCL (PF) 1 % IJ SOLN
INTRAMUSCULAR | Status: AC
Start: 2013-10-27 — End: 2013-10-27
  Filled 2013-10-27: qty 30

## 2013-10-27 MED ORDER — FUROSEMIDE 10 MG/ML IJ SOLN
40.0000 mg | Freq: Once | INTRAMUSCULAR | Status: AC
  Administered 2013-10-27: 40 mg via INTRAVENOUS

## 2013-10-27 NOTE — Progress Notes (Signed)
Echocardiogram 2D Echocardiogram with Definity has been performed.  Mark French 10/27/2013, 10:35 AM

## 2013-10-27 NOTE — CV Procedure (Signed)
Left Heart Catheterization with Coronary Angiography and Bypass Graft AngiographyReport  Mark French  68 y.o.  male July 25, 1946  Procedure Date: 10/27/2013  Referring Physician: Kirk Ruths, M.D. Primary Cardiologist:: Sherren Mocha, M.D. Primary Physician: [name], M.D.  INDICATIONS: Non-ST elevation MI (no chest pain, elevated cardiac markers, acute heart failure, tachycardia on EKG)  PROCEDURE: 1. Left heart catheterization; 2. Coronary angiography; 3. Left ventriculography; 4. Bypass graft angiography; 5. Internal mammary artery angiography  CONSENT:  The risks, benefits, and details of the procedure were explained in detail to the patient. Risks including death, stroke, heart attack, kidney injury, allergy, limb ischemia, bleeding and radiation injury were discussed.  The patient verbalized understanding and wanted to proceed.  Informed written consent was obtained.  PROCEDURE TECHNIQUE:  After Xylocaine anesthesia a 5 French sheath was placed in the right femoral artery with a single anterior needle wall stick.  Coronary angiography was done using a [place] A2 multipurpose-purpose catheter and 5 [place] internal mammary artery catheter.  Left ventriculography was done using the 5 French A2 multipurpose catheter and hand injection.   MEDICATIONS: none    CONTRAST:  Total of 75 cc.  COMPLICATIONS:  none   HEMODYNAMICS:  Aortic pressure 133/62 mmHg; LV pressure 131/18 mmHg; LVEDP 33 mmHg  ANGIOGRAPHIC DATA:   The left main coronary artery is diffusely diseased but patent..  The left anterior descending artery is totally occluded in the mid vessel. Severely and diffusely diseased throughout the entire proximal one half of the vessel with up to 95% stenosis noted after the first septal perforator. The LAD and all branches are small and severely/diffusely involved in a pattern compatible with long-standing diabetes mellitus..  The left circumflex artery is 1%  occluded proximally.  The right coronary artery is severely and diffusely diseased and totally occluded distally after the PDA.[name]  BYPASS GRAFT ANGIOGRAPHY:   SVG to circumflex: Totally occluded SVG to diagonal: Diffusely diseased. The mid graft 70% stenosis proximal to overlapping stents in the distal body. The diagonal is small and diffusely diseased. SVG to RCA: Totally occluded Left internal mammograph to LAD: Widely patent with severe diffuse narrowing with the appearance of "small diabetic vessels".     LEFT VENTRICULOGRAM:  Left ventricular angiogram was done in the 30 RAO projection and revealed very poor inferior wall contractility. Anterior wall contractility appears normal. Ejection fraction is in the 35-40% range. This is a poor quality ventriculogram as it was generated only by hand injection through the multipurpose catheter. We did not hour inject because of severely elevated LVEDP.   IMPRESSIONS:  1. 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 The mid LAD is totally occluded, circumflex 100% occluded proximally, and RCA occluded distally.  2. 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 The saphenous vein graft to the diagonal contains moderate disease, patent overlapping distal stents, and ends on a small diagonal with relatively poor runoff.  3. Left ventricular systolic dysfunction with evidence of heart failure denoted by significant LVEDP elevation of 33 mmHg  4. Patent LIMA to LAD. The LAD distal to the LIMA insertion is severely and diffusely diseased " diabetic pattern"  RECOMMENDATION:   1. Aggressive therapy of heart failure with additional diuresis as tolerated by blood pressure  2. I would not repeatedly perform angiography on this patient for enzyme leaks because of the diffuse nature of his coronary disease process.  3. Primary focus of therapy should  be control of symptoms with medications.

## 2013-10-27 NOTE — Progress Notes (Signed)
ANTICOAGULATION CONSULT NOTE - Follow Up Consult  Pharmacy Consult for Heparin  Indication: chest pain/ACS  Allergies  Allergen Reactions  . Sildenafil Nausea Only and Other (See Comments)    "took it once; heart went to pieces; never took it again"  . Percocet [Oxycodone-Acetaminophen] Other (See Comments)    Unknown reaction-possible altered mental state-per wife.    . Contrast Media [Iodinated Diagnostic Agents] Nausea Only    Patient Measurements: Height: 5\' 1"  (154.9 cm) Weight: 225 lb 8.5 oz (102.3 kg) IBW/kg (Calculated) : 52.3  Vital Signs: Temp: 98.1 F (36.7 C) (03/23 0811) Temp src: Oral (03/23 0811) BP: 98/43 mmHg (03/23 0811) Pulse Rate: 69 (03/23 0811)  Labs:  Recent Labs  10/25/13 0847 10/25/13 1730 10/26/13 0600 10/26/13 1246 10/26/13 1547 10/27/13 0330  HGB 13.3  --  11.2*  --   --  10.8*  HCT 41.5  --  35.3*  --   --  34.6*  PLT 191  --  221  --   --  181  LABPROT 11.5*  --   --   --   --   --   INR 0.85  --   --   --   --   --   HEPARINUNFRC  --   --  0.54  --  0.55 0.47  CREATININE 1.42*  --  1.28  --   --  1.30  TROPONINI <0.30 2.84*  --  5.32*  --   --     Estimated Creatinine Clearance: 54.1 ml/min (by C-G formula based on Cr of 1.3).  Medications:  Heparin 1050 units/hr  Assessment: 68 y/o M on heparin for NSTEMI. Heparin is therapeutic on 1050 units/hr. CBC is stable and no bleeding noted. Planning on cardiac cath today.   Goal of Therapy:  Heparin level 0.3-0.7 units/ml Monitor platelets by anticoagulation protocol: Yes   Plan:  1. Continue heparin gtt 1050 units/hr 2. F/u AM heparin level + CBC or f/u post-cath   Salome Arnt, PharmD, BCPS Pager # [phone] 10/27/2013 9:12 AM

## 2013-10-27 NOTE — Progress Notes (Signed)
[facility] PROGRESS NOTE  Mark French SWN:[id number] DOB: March 06, 1946 DOA: 10/25/2013 PCP: [name], MD  Assessment/Plan: Principal Problem:   NSTEMI (non-ST elevated myocardial infarction): Continue nitrates and heparin. Cath notes triple vessel disease.  Will discuss with Cardiology, but assuming referral for CVTS.  Active Problems:   Chronic kidney disease, stage III (moderate): Creatinine actually improved with diuresis, unchanged from yesterday    Chest pain: Now chest pain-free. Non-STEMI.    Complete heart block   Chronic pain syndrome: From peripheral neuropathy. See below.    Acute on chronic combined systolic and diastolic heart failure: Diuresed 5 liters   Acute respiratory failure: Secondary to CHF    DM (diabetes mellitus) type II controlled, neurological manifestation/peripheral neuropathy: Continue insulin at reduced dose plus sliding scale. His neuropathy is quite .  Not oversedated.significant. He looks to be in chronic pain. He takes Percocet every 4 hours plus Neurontin 600 3 times a day and prednisone she has little relief.  We'll try increasing Neurontin to 800 3 times a day and monitor for sedation. In addition, we'll try MS Contin extended release every 12 hours    ARF (acute renal failure): Improved with diuresis  Code Status: Full code  Family Communication: Wife at the bedside.  Disposition Plan: Keep in step down unit.    Consultants:  Cardiology  Procedures:  Cardiac catheterization done 3/23  Antibiotics:  None  HPI/Subjective: Patient seen earlier this am, before cath.  Chest pain free.  Legs a little better-pain wise Objective: Filed Vitals:   10/27/13 1600  BP: 111/42  Pulse: 71  Temp: 97.5 F (36.4 C)  Resp: 18    Intake/Output Summary (Last 24 hours) at 10/27/13 1808 Last data filed at 10/27/13 1631  Gross per 24 hour  Intake    498 ml  Output   3775 ml  Net  -3277 ml   Filed Weights   10/25/13 1434 10/26/13  0500 10/27/13 0600  Weight: 105.053 kg (231 lb 9.6 oz) 103.3 kg (227 lb 11.8 oz) 102.3 kg (225 lb 8.5 oz)    Exam:   General:  Alert and oriented x3, slightly anxiousCardiovascular: Regular rate and rhythm, X3-G1, 2/6 systolic ejection murmur  Respiratory: Decreased breath sounds bibasilar  Abdomen: Soft, nontender, nondistended, positive bowel sounds  Musculoskeletal: Signs of chronic peripheral neuropathy, decreased sensation, 1+ pitting edema   Data Reviewed: Basic Metabolic Panel:  Recent Labs Lab 10/25/13 0847 10/26/13 0600 10/27/13 0330  NA 137 141 140  K 3.7 4.3 4.6  CL 96 99 99  CO2 26 27 29   GLUCOSE 158* 191* 137*  BUN 20 27* 30*  CREATININE 1.42* 1.28 1.30  CALCIUM 9.6 8.9 8.7   Liver Function Tests: No results found for this basename: AST, ALT, ALKPHOS, BILITOT, PROT, ALBUMIN,  in the last 168 hours No results found for this basename: LIPASE, AMYLASE,  in the last 168 hours No results found for this basename: AMMONIA,  in the last 168 hours CBC:  Recent Labs Lab 10/25/13 0847 10/26/13 0600 10/27/13 0330  WBC 10.0 12.1* 11.5*  NEUTROABS 6.5  --   --   HGB 13.3 11.2* 10.8*  HCT 41.5 35.3* 34.6*  MCV 85.6 84.2 85.0  PLT 191 221 181   Cardiac Enzymes:  Recent Labs Lab 10/25/13 0847 10/25/13 1730 10/26/13 1246  TROPONINI <0.30 2.84* 5.32*   BNP (last 3 results)  Recent Labs  08/07/13 1745 08/18/13 1232 10/25/13 0847  PROBNP 568.0* 608.1* 936.7*   CBG:  Recent Labs Lab 10/26/13 1700 10/26/13 2209 10/27/13 0813 10/27/13 1211 10/27/13 1749  GLUCAP 105* 91 134* 124* 207*    Recent Results (from the past 240 hour(s))  MRSA PCR SCREENING     Status: None   Collection Time    10/25/13  3:04 PM      Result Value Ref Range Status   MRSA by PCR NEGATIVE  NEGATIVE Final   Comment:            The GeneXpert MRSA Assay (FDA     approved for NASAL specimens     only), is one component of a     comprehensive MRSA colonization      surveillance program. It is not     intended to diagnose MRSA     infection nor to guide or     monitor treatment for     MRSA infections.     Studies: No results found.  Scheduled Meds: . aspirin EC  81 mg Oral QHS  . atorvastatin  40 mg Oral q1800  . calcitRIOL  0.25 mcg Oral Daily  . carvedilol  3.125 mg Oral BID WC  . clopidogrel  75 mg Oral Q breakfast  . docusate sodium  100 mg Oral BID  . [START ON 10/28/2013] enoxaparin (LOVENOX) injection  50 mg Subcutaneous Q24H  . furosemide  40 mg Intravenous Once  . insulin aspart  0-15 Units Subcutaneous TID WC  . insulin NPH Human  35 Units Subcutaneous QAC breakfast  . isosorbide mononitrate  30 mg Oral Daily  . losartan  25 mg Oral Daily  . pantoprazole  40 mg Oral Daily  . potassium chloride SA  20 mEq Oral BID   Continuous Infusions: . sodium chloride 50 mL/hr at 10/27/13 1525    Principal Problem:   NSTEMI (non-ST elevated myocardial infarction) Active Problems:   Chronic kidney disease, stage III (moderate)   Chest pain   Complete heart block   Chronic pain syndrome   Acute on chronic combined systolic and diastolic heart failure   Acute respiratory failure   DM (diabetes mellitus) type II controlled, neurological manifestation   ARF (acute renal failure)    Time spent: 20 minutes    [place] [facility] Pager [phone]. If 7PM-7AM, please contact night-coverage at [email or web address], password [facility] 10/27/2013, 6:08 PM  LOS: 2 days

## 2013-10-27 NOTE — Progress Notes (Signed)
Subjective: No CP or SOB  Objective: Vital signs in last 24 hours: Temp:  [98 F (36.7 C)-98.3 F (36.8 C)] 98.1 F (36.7 C) (03/23 0811) Pulse Rate:  [69-86] 69 (03/23 0811) Resp:  [15-24] 16 (03/23 0811) BP: (97-121)/(43-98) 98/43 mmHg (03/23 0811) SpO2:  [98 %-100 %] 98 % (03/23 0811) Weight:  [225 lb 8.5 oz (102.3 kg)] 225 lb 8.5 oz (102.3 kg) (03/23 0600) Last BM Date: 10/26/13  Intake/Output from previous day: 03/22 0701 - 03/23 0700 In: 675.9 [I.V.:675.9] Out: 1975 [Urine:1975] Intake/Output this shift: Total I/O In: 6 [I.V.:6] Out: -   Medications Current Facility-Administered Medications  Medication Dose Route Frequency Provider Last Rate Last Dose  . 0.9 %  sodium chloride infusion  250 mL Intravenous PRN Orson Eva, MD 10 mL/hr at 10/27/13 0400 250 mL at 10/27/13 0400  . 0.9 %  sodium chloride infusion  250 mL Intravenous PRN Lelon Perla, MD      . 0.9 %  sodium chloride infusion   Intravenous Continuous Lelon Perla, MD      . albuterol (PROVENTIL) (2.5 MG/3ML) 0.083% nebulizer solution 2.5 mg  2.5 mg Nebulization Q4H PRN Orson Eva, MD      . aspirin EC tablet 81 mg  81 mg Oral QHS Orson Eva, MD   81 mg at 10/26/13 2149  . atorvastatin (LIPITOR) tablet 40 mg  40 mg Oral q1800 Orson Eva, MD   40 mg at 10/26/13 1712  . calcitRIOL (ROCALTROL) capsule 0.25 mcg  0.25 mcg Oral Daily Orson Eva, MD   0.25 mcg at 10/26/13 3614  . carvedilol (COREG) tablet 3.125 mg  3.125 mg Oral BID WC Orson Eva, MD   3.125 mg at 10/26/13 1712  . clopidogrel (PLAVIX) tablet 75 mg  75 mg Oral Q breakfast Orson Eva, MD   75 mg at 10/26/13 0800  . diphenhydrAMINE (BENADRYL) injection 25 mg  25 mg Intravenous Pre-Cath Lelon Perla, MD      . docusate sodium (COLACE) capsule 100 mg  100 mg Oral BID Orson Eva, MD   100 mg at 10/26/13 2149  . furosemide (LASIX) injection 80 mg  80 mg Intravenous BID Lelon Perla, MD   80 mg at 10/26/13 1713  . gabapentin (NEURONTIN) capsule  800 mg  800 mg Oral TID Rocky Crafts Hammons, RPH   800 mg at 10/26/13 2148  . heparin ADULT infusion 100 units/mL (25000 units/250 mL)  1,050 Units/hr Intravenous Continuous Kendra P Hiatt, RPH 10.5 mL/hr at 10/27/13 0400 1,050 Units/hr at 10/27/13 0400  . HYDROcodone-acetaminophen (NORCO) 10-325 MG per tablet 1-2 tablet  1-2 tablet Oral Q4H PRN Annita Brod, MD   1 tablet at 10/27/13 0015  . insulin aspart (novoLOG) injection 0-15 Units  0-15 Units Subcutaneous TID WC Orson Eva, MD   2 Units at 10/26/13 1152  . insulin NPH Human (HUMULIN N,NOVOLIN N) injection 35 Units  35 Units Subcutaneous QAC breakfast Orson Eva, MD   35 Units at 10/26/13 0800  . isosorbide mononitrate (IMDUR) 24 hr tablet 30 mg  30 mg Oral Daily Orson Eva, MD   30 mg at 10/26/13 4315  . LORazepam (ATIVAN) injection 0.5 mg  0.5 mg Intravenous Q4H PRN Orson Eva, MD      . losartan (COZAAR) tablet 25 mg  25 mg Oral Daily Orson Eva, MD   25 mg at 10/26/13 4008  . morphine (MS CONTIN) 12 hr tablet 15 mg  15 mg  Oral Q12H Annita Brod, MD   15 mg at 10/26/13 2149  . pantoprazole (PROTONIX) EC tablet 40 mg  40 mg Oral Daily Orson Eva, MD   40 mg at 10/26/13 1696  . potassium chloride SA (K-DUR,KLOR-CON) CR tablet 20 mEq  20 mEq Oral BID Orson Eva, MD   20 mEq at 10/26/13 2150  . predniSONE (DELTASONE) tablet 60 mg  60 mg Oral Pre-Cath Lelon Perla, MD      . sodium chloride 0.9 % injection 3 mL  3 mL Intravenous Q12H Orson Eva, MD   3 mL at 10/26/13 2225  . sodium chloride 0.9 % injection 3 mL  3 mL Intravenous PRN Shanon Brow Tat, MD      . sodium chloride 0.9 % injection 3 mL  3 mL Intravenous Q12H Lelon Perla, MD      . sodium chloride 0.9 % injection 3 mL  3 mL Intravenous PRN Lelon Perla, MD      . tamsulosin (FLOMAX) capsule 0.4 mg  0.4 mg Oral Daily Orson Eva, MD   0.4 mg at 10/26/13 7893    PE: General appearance: alert, cooperative and no distress Lungs: clear to auscultation bilaterally Heart:  regular rate and rhythm, S1, S2 normal, no murmur, click, rub or gallop Abdomen: soft, non-tender; bowel sounds normal; no masses,  no organomegaly Extremities: No LEE in fact they appear overly dry Pulses: 2+ radials.  no DPs.  Feet are warm. Skin: Warm and dry Neurologic: Grossly normal  Lab Results:   Recent Labs  10/25/13 0847 10/26/13 0600 10/27/13 0330  WBC 10.0 12.1* 11.5*  HGB 13.3 11.2* 10.8*  HCT 41.5 35.3* 34.6*  PLT 191 221 181   BMET  Recent Labs  10/25/13 0847 10/26/13 0600 10/27/13 0330  NA 137 141 140  K 3.7 4.3 4.6  CL 96 99 99  CO2 26 27 29   GLUCOSE 158* 191* 137*  BUN 20 27* 30*  CREATININE 1.42* 1.28 1.30  CALCIUM 9.6 8.9 8.7   PT/INR  Recent Labs  10/25/13 0847  LABPROT 11.5*  INR 0.85   Cardiac Panel (last 3 results)  Recent Labs  10/25/13 0847 10/25/13 1730 10/26/13 1246  TROPONINI <0.30 2.84* 5.32*      Assessment/Plan  68 year old male with a history of diabetes mellitus, hypertension, CKD stage III, chronic systolic HF with EF of 81%, CVA, PVD, complete heart block s/p pacemaker placement and significant CAD s/p CABG 1998, inf STEMI 2013 with multiple PCIs post CABG, last cath revealing patent LIMA-LAD, CTO of SVGs to RCA and LCx with 95% stenosis in SVG to Diag and negative nuc stress in 10/2012, who admitted to hospitalist service for acute SOB requiring BiPAP consistent with COPD and CHF exacerbation.   Principal Problem:   NSTEMI (non-ST elevated myocardial infarction) Active Problems:   HYPERCHOLESTEROLEMIA   HYPERTENSION   Chronic kidney disease, stage III (moderate)   Chest pain   Complete heart block   Chronic pain syndrome   Acute on chronic combined systolic and diastolic heart failure   Acute respiratory failure   DM (diabetes mellitus) type II controlled, neurological manifestation   ARF (acute renal failure)  Plan:  Troponin now up to 5.32.  On ASA, plavix, IV heparin, coreg, statin.  Coronary angiography  today.  SCr stable.  A1C-7.1. Humulin and SS insulin.  2D echo in progress currently.  Net fluids: -1.3L/-2.9L.  Lasix was supposed to be held this morning according to Dr. Jacalyn Lefevre note  but it was given.  Will DC and reassess tomorrow.  His lower extremities look dry and BNP was only 936 on 3/21.  Lungs sound clear but the patient could not sit upright.  Depending on echo results may need a small amount of IV fluids after cath.  Repeat BNP in AM.  No DVT on LE venous dopplers.    LOS: 2 days    HAGER, BRYAN 10/27/2013 9:32 AM  Patient seen, examined. Available data reviewed. Agree with findings, assessment, and plan as outlined by Tarri Fuller, PA-C. The patient is well-known to me. He is chronically ill, primarily related to issues with diabetes and peripheral vascular disease. He has very limited mobility over many years. His most difficult problems over the last 2 years have been related to extensive coronary artery disease and congestive heart failure. He presented again this admission with heart failure and non-ST elevation infarction. Plan as noted for cardiac catheterization today. He had recent heart catheterization in January and I'm not sure there will be much to offer him from an interventional perspective. However, he did have a significant non-ST elevation infarction by enzymes this admission with initially normal enzymes noted. If no change in coronary anatomy, I would plan on long-term conservative care. He remains on IV heparin and has had no chest pain overnight. Further plans and disposition pending his cardiac catheterization results.  Sherren Mocha, M.D. 10/27/2013 10:35 AM

## 2013-10-27 NOTE — Progress Notes (Signed)
ANTICOAGULATION CONSULT NOTE - Initial Consult  Pharmacy Consult for lovenox Indication: VTE prophylaxis  Allergies  Allergen Reactions  . Sildenafil Nausea Only and Other (See Comments)    "took it once; heart went to pieces; never took it again"  . Percocet [Oxycodone-Acetaminophen] Other (See Comments)    Unknown reaction-possible altered mental state-per wife.    . Contrast Media [Iodinated Diagnostic Agents] Nausea Only    Patient Measurements: Height: 5\' 1"  (154.9 cm) Weight: 225 lb 8.5 oz (102.3 kg) IBW/kg (Calculated) : 52.3 Heparin Dosing Weight:   Vital Signs: Temp: 97.8 F (36.6 C) (03/23 1208) Temp src: Oral (03/23 1208) BP: 108/51 mmHg (03/23 1208) Pulse Rate: 83 (03/23 1414)  Labs:  Recent Labs  10/25/13 0847 10/25/13 1730 10/26/13 0600 10/26/13 1246 10/26/13 1547 10/27/13 0330  HGB 13.3  --  11.2*  --   --  10.8*  HCT 41.5  --  35.3*  --   --  34.6*  PLT 191  --  221  --   --  181  LABPROT 11.5*  --   --   --   --   --   INR 0.85  --   --   --   --   --   HEPARINUNFRC  --   --  0.54  --  0.55 0.47  CREATININE 1.42*  --  1.28  --   --  1.30  TROPONINI <0.30 2.84*  --  5.32*  --   --     Estimated Creatinine Clearance: 54.1 ml/min (by C-G formula based on Cr of 1.3).   Medical History: Past Medical History  Diagnosis Date  . CORONARY ARTERY DISEASE     a. s/p CABG 1998. b.  08/2011: inferior STEMI.  LHC 09/07/11: Severe 3v CAD, LIMA-LAD patent, SVG-RCA chronically occluded, SVG-circumflex chronically occluded, SVG-diagonal 95%. PCI: Promus DES to the SVG-diagonal.  Procedure c/b transient CHB req CPR and TTVP. c. Myoview 11/2011: scar but no ischemia.   [name] DIABETES MELLITUS, TYPE I   . HYPERTENSION   . 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 HYPERCHOLESTEROLEMIA   . CERVICAL RADICULOPATHY, LEFT   . PERIPHERAL NEUROPATHY   . Chronic systolic heart failure     Echocardiogram 08/09/13:Cavity size normal, EF 40%, inferior hypokinesis, grade 1  diastolic dysfunction, paradoxical septal motion, mild MR, mild LAE, no PFO, PAS 51, moderate pulm HTN  . ED (erectile dysfunction)   . Hypogonadism male   . GI bleed 2012    Multiple Duodenal ulcer   . Obesity   . Legally blind     bilaterally  . Stroke 37    age 34 months; residual right sided weakness  . History of blood transfusion 2012  . Charcot foot due to diabetes mellitus     chronic pain  . Osteoarthritis     right hip and shoulder  . COPD (chronic obstructive pulmonary disease)   . 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 Hiatal hernia   . Chronic stasis dermatitis     Chronic stasis changes.  . CKD (chronic kidney disease), stage III   . Syncope     a. 07/2013: suspected due to hypotension.  . Biventricular cardiac pacemaker in situ     a. [address] [employer] RF model [id number] (serial number [id number])  . Complete heart block     a. s/p pacemaker placement on 08/2013 admission    Medications:  Scheduled:  . aspirin EC  81 mg Oral  QHS  . atorvastatin  40 mg Oral q1800  . calcitRIOL  0.25 mcg Oral Daily  . carvedilol  3.125 mg Oral BID WC  . clopidogrel  75 mg Oral Q breakfast  . docusate sodium  100 mg Oral BID  . gabapentin  800 mg Oral TID  . insulin aspart  0-15 Units Subcutaneous TID WC  . insulin NPH Human  35 Units Subcutaneous QAC breakfast  . isosorbide mononitrate  30 mg Oral Daily  . losartan  25 mg Oral Daily  . morphine  15 mg Oral Q12H  . pantoprazole  40 mg Oral Daily  . potassium chloride SA  20 mEq Oral BID  . sodium chloride  3 mL Intravenous Q12H  . tamsulosin  0.4 mg Oral Daily   Infusions:  . sodium chloride 50 mL/hr at 10/27/13 1525    Assessment: 68 yo male s/p cath will be started on lovenox for VTE prophylaxis.  Sheath was removed at 1530 per RN and patient was on IV heparin prior to cath.  Patient weighs 102.3 kg and CrCl >100 Goal of Therapy:   Monitor platelets by anticoagulation protocol:  Yes   Plan:  1) Lovenox 50mg  sq q24h, 1st dose at 0500 tomorrow. 2) CBC every 72 hours  Primus Gritton, Tsz-Yin 10/27/2013,4:17 PM

## 2013-10-27 NOTE — Progress Notes (Signed)
PT Cancellation Note  Patient Details Name: Mark French MRN: [id number] DOB: [birth year]   Cancelled Treatment:    Reason Eval/Treat Not Completed: Medical issues which prohibited therapy (pt with troponin still rising and await decline prior to evaluation)   Melford Aase 10/27/2013, [place], [place]

## 2013-10-27 NOTE — Interval H&P Note (Signed)
Cath Lab Visit (complete for each Cath Lab visit)  Clinical Evaluation Leading to the Procedure:   ACS: yes  Non-ACS:    Anginal Classification: CCS IV  Anti-ischemic medical therapy: Maximal Therapy (2 or more classes of medications)  Non-Invasive Test Results: No non-invasive testing performed  Prior CABG: Previous CABG      History and Physical Interval Note:  10/27/2013 2:14 PM  Mark French  has presented today for surgery, with the diagnosis of cp  The various methods of treatment have been discussed with the patient and family. After consideration of risks, benefits and other options for treatment, the patient has consented to  Procedure(s): LEFT HEART CATHETERIZATION WITH CORONARY/GRAFT ANGIOGRAM (N/A) as a surgical intervention .  The patient's history has been reviewed, patient examined, no change in status, stable for surgery.  I have reviewed the patient's chart and labs.  Questions were answered to the patient's satisfaction.     Sinclair Grooms

## 2013-10-27 NOTE — H&P (View-Only) (Signed)
  Subjective: No CP or SOB  Objective: Vital signs in last 24 hours: Temp:  [98 F (36.7 C)-98.3 F (36.8 C)] 98.1 F (36.7 C) (03/23 0811) Pulse Rate:  [69-86] 69 (03/23 0811) Resp:  [15-24] 16 (03/23 0811) BP: (97-121)/(43-98) 98/43 mmHg (03/23 0811) SpO2:  [98 %-100 %] 98 % (03/23 0811) Weight:  [225 lb 8.5 oz (102.3 kg)] 225 lb 8.5 oz (102.3 kg) (03/23 0600) Last BM Date: 10/26/13  Intake/Output from previous day: 03/22 0701 - 03/23 0700 In: 675.9 [I.V.:675.9] Out: 1975 [Urine:1975] Intake/Output this shift: Total I/O In: 6 [I.V.:6] Out: -   Medications Current Facility-Administered Medications  Medication Dose Route Frequency Provider Last Rate Last Dose  . 0.9 %  sodium chloride infusion  250 mL Intravenous PRN David Tat, MD 10 mL/hr at 10/27/13 0400 250 mL at 10/27/13 0400  . 0.9 %  sodium chloride infusion  250 mL Intravenous PRN Brian S Crenshaw, MD      . 0.9 %  sodium chloride infusion   Intravenous Continuous Brian S Crenshaw, MD      . albuterol (PROVENTIL) (2.5 MG/3ML) 0.083% nebulizer solution 2.5 mg  2.5 mg Nebulization Q4H PRN David Tat, MD      . aspirin EC tablet 81 mg  81 mg Oral QHS David Tat, MD   81 mg at 10/26/13 2149  . atorvastatin (LIPITOR) tablet 40 mg  40 mg Oral q1800 David Tat, MD   40 mg at 10/26/13 1712  . calcitRIOL (ROCALTROL) capsule 0.25 mcg  0.25 mcg Oral Daily David Tat, MD   0.25 mcg at 10/26/13 0924  . carvedilol (COREG) tablet 3.125 mg  3.125 mg Oral BID WC David Tat, MD   3.125 mg at 10/26/13 1712  . clopidogrel (PLAVIX) tablet 75 mg  75 mg Oral Q breakfast David Tat, MD   75 mg at 10/26/13 0800  . diphenhydrAMINE (BENADRYL) injection 25 mg  25 mg Intravenous Pre-Cath Brian S Crenshaw, MD      . docusate sodium (COLACE) capsule 100 mg  100 mg Oral BID David Tat, MD   100 mg at 10/26/13 2149  . furosemide (LASIX) injection 80 mg  80 mg Intravenous BID Brian S Crenshaw, MD   80 mg at 10/26/13 1713  . gabapentin (NEURONTIN) capsule  800 mg  800 mg Oral TID Kimberly Ballard Hammons, RPH   800 mg at 10/26/13 2148  . heparin ADULT infusion 100 units/mL (25000 units/250 mL)  1,050 Units/hr Intravenous Continuous Kendra P Hiatt, RPH 10.5 mL/hr at 10/27/13 0400 1,050 Units/hr at 10/27/13 0400  . HYDROcodone-acetaminophen (NORCO) 10-325 MG per tablet 1-2 tablet  1-2 tablet Oral Q4H PRN Sendil K Krishnan, MD   1 tablet at 10/27/13 0015  . insulin aspart (novoLOG) injection 0-15 Units  0-15 Units Subcutaneous TID WC David Tat, MD   2 Units at 10/26/13 1152  . insulin NPH Human (HUMULIN N,NOVOLIN N) injection 35 Units  35 Units Subcutaneous QAC breakfast David Tat, MD   35 Units at 10/26/13 0800  . isosorbide mononitrate (IMDUR) 24 hr tablet 30 mg  30 mg Oral Daily David Tat, MD   30 mg at 10/26/13 0923  . LORazepam (ATIVAN) injection 0.5 mg  0.5 mg Intravenous Q4H PRN David Tat, MD      . losartan (COZAAR) tablet 25 mg  25 mg Oral Daily David Tat, MD   25 mg at 10/26/13 0923  . morphine (MS CONTIN) 12 hr tablet 15 mg  15 mg   Oral Q12H Annita Brod, MD   15 mg at 10/26/13 2149  . pantoprazole (PROTONIX) EC tablet 40 mg  40 mg Oral Daily Orson Eva, MD   40 mg at 10/26/13 1696  . potassium chloride SA (K-DUR,KLOR-CON) CR tablet 20 mEq  20 mEq Oral BID Orson Eva, MD   20 mEq at 10/26/13 2150  . predniSONE (DELTASONE) tablet 60 mg  60 mg Oral Pre-Cath Lelon Perla, MD      . sodium chloride 0.9 % injection 3 mL  3 mL Intravenous Q12H Orson Eva, MD   3 mL at 10/26/13 2225  . sodium chloride 0.9 % injection 3 mL  3 mL Intravenous PRN Shanon Brow Tat, MD      . sodium chloride 0.9 % injection 3 mL  3 mL Intravenous Q12H Lelon Perla, MD      . sodium chloride 0.9 % injection 3 mL  3 mL Intravenous PRN Lelon Perla, MD      . tamsulosin (FLOMAX) capsule 0.4 mg  0.4 mg Oral Daily Orson Eva, MD   0.4 mg at 10/26/13 7893    PE: General appearance: alert, cooperative and no distress Lungs: clear to auscultation bilaterally Heart:  regular rate and rhythm, S1, S2 normal, no murmur, click, rub or gallop Abdomen: soft, non-tender; bowel sounds normal; no masses,  no organomegaly Extremities: No LEE in fact they appear overly dry Pulses: 2+ radials.  no DPs.  Feet are warm. Skin: Warm and dry Neurologic: Grossly normal  Lab Results:   Recent Labs  10/25/13 0847 10/26/13 0600 10/27/13 0330  WBC 10.0 12.1* 11.5*  HGB 13.3 11.2* 10.8*  HCT 41.5 35.3* 34.6*  PLT 191 221 181   BMET  Recent Labs  10/25/13 0847 10/26/13 0600 10/27/13 0330  NA 137 141 140  K 3.7 4.3 4.6  CL 96 99 99  CO2 26 27 29   GLUCOSE 158* 191* 137*  BUN 20 27* 30*  CREATININE 1.42* 1.28 1.30  CALCIUM 9.6 8.9 8.7   PT/INR  Recent Labs  10/25/13 0847  LABPROT 11.5*  INR 0.85   Cardiac Panel (last 3 results)  Recent Labs  10/25/13 0847 10/25/13 1730 10/26/13 1246  TROPONINI <0.30 2.84* 5.32*      Assessment/Plan  68 year old male with a history of diabetes mellitus, hypertension, CKD stage III, chronic systolic HF with EF of 81%, CVA, PVD, complete heart block s/p pacemaker placement and significant CAD s/p CABG 1998, inf STEMI 2013 with multiple PCIs post CABG, last cath revealing patent LIMA-LAD, CTO of SVGs to RCA and LCx with 95% stenosis in SVG to Diag and negative nuc stress in 10/2012, who admitted to hospitalist service for acute SOB requiring BiPAP consistent with COPD and CHF exacerbation.   Principal Problem:   NSTEMI (non-ST elevated myocardial infarction) Active Problems:   HYPERCHOLESTEROLEMIA   HYPERTENSION   Chronic kidney disease, stage III (moderate)   Chest pain   Complete heart block   Chronic pain syndrome   Acute on chronic combined systolic and diastolic heart failure   Acute respiratory failure   DM (diabetes mellitus) type II controlled, neurological manifestation   ARF (acute renal failure)  Plan:  Troponin now up to 5.32.  On ASA, plavix, IV heparin, coreg, statin.  Coronary angiography  today.  SCr stable.  A1C-7.1. Humulin and SS insulin.  2D echo in progress currently.  Net fluids: -1.3L/-2.9L.  Lasix was supposed to be held this morning according to Dr. Jacalyn Lefevre note  but it was given.  Will DC and reassess tomorrow.  His lower extremities look dry and BNP was only 936 on 3/21.  Lungs sound clear but the patient could not sit upright.  Depending on echo results may need a small amount of IV fluids after cath.  Repeat BNP in AM.  No DVT on LE venous dopplers.    LOS: 2 days    HAGER, BRYAN 10/27/2013 9:32 AM  Patient seen, examined. Available data reviewed. Agree with findings, assessment, and plan as outlined by Bryan Hager, PA-C. The patient is well-known to me. He is chronically ill, primarily related to issues with diabetes and peripheral vascular disease. He has very limited mobility over many years. His most difficult problems over the last 2 years have been related to extensive coronary artery disease and congestive heart failure. He presented again this admission with heart failure and non-ST elevation infarction. Plan as noted for cardiac catheterization today. He had recent heart catheterization in January and I'm not sure there will be much to offer him from an interventional perspective. However, he did have a significant non-ST elevation infarction by enzymes this admission with initially normal enzymes noted. If no change in coronary anatomy, I would plan on long-term conservative care. He remains on IV heparin and has had no chest pain overnight. Further plans and disposition pending his cardiac catheterization results.  Parissa Chiao, M.D. 10/27/2013 10:35 AM   

## 2013-10-27 NOTE — Progress Notes (Signed)
Pt now NPO. R groin shaved and prepped for Cardiac cath. Signed consent placed in the front of his chart.

## 2013-10-28 LAB — GLUCOSE, CAPILLARY
GLUCOSE-CAPILLARY: 120 mg/dL — AB (ref 70–99)
GLUCOSE-CAPILLARY: 126 mg/dL — AB (ref 70–99)
GLUCOSE-CAPILLARY: 129 mg/dL — AB (ref 70–99)
Glucose-Capillary: 109 mg/dL — ABNORMAL HIGH (ref 70–99)

## 2013-10-28 LAB — BASIC METABOLIC PANEL
BUN: 35 mg/dL — AB (ref 6–23)
CHLORIDE: 99 meq/L (ref 96–112)
CO2: 24 meq/L (ref 19–32)
Calcium: 8.7 mg/dL (ref 8.4–10.5)
Creatinine, Ser: 1.16 mg/dL (ref 0.50–1.35)
GFR calc Af Amer: 72 mL/min — ABNORMAL LOW (ref >90)
GFR, EST NON AFRICAN AMERICAN: 62 mL/min — AB (ref >90)
GLUCOSE: 142 mg/dL — AB (ref 70–99)
POTASSIUM: 4.7 meq/L (ref 3.7–5.3)
SODIUM: 138 meq/L (ref 137–147)

## 2013-10-28 LAB — PRO B NATRIURETIC PEPTIDE: Pro B Natriuretic peptide (BNP): 3022 pg/mL — ABNORMAL HIGH (ref 0–125)

## 2013-10-28 MED ORDER — NYSTATIN 100000 UNIT/GM EX POWD
Freq: Three times a day (TID) | CUTANEOUS | Status: DC
  Administered 2013-10-28 – 2013-10-30 (×7): via TOPICAL
  Filled 2013-10-28: qty 15

## 2013-10-28 MED ORDER — FUROSEMIDE 10 MG/ML IJ SOLN
40.0000 mg | Freq: Two times a day (BID) | INTRAMUSCULAR | Status: DC
  Administered 2013-10-28 – 2013-10-30 (×4): 40 mg via INTRAVENOUS
  Filled 2013-10-28 (×6): qty 4

## 2013-10-28 NOTE — Progress Notes (Signed)
CARDIAC REHAB PHASE I   PRE:  Rate/Rhythm: 69 pacing  BP:  Supine:   Sitting: 134/42  Standing:    SaO2: 100%2L 98%RA  MODE:  Ambulation: 25 ft Had to be wheeled back to room in wheelchair  POST:  Rate/Rhythm: 85 paced  BP:  Supine:   Sitting: 112/2  Standing:    SaO2: 91-92%RA hall [phone] Pt very deconditioned. Walked 25 ft on RA with rolling walker and asst x 2 with unsteady gait. C/o feeling weak and needing to sit in hall. Let pt sit in chair and he stated could not make it back. Had to use wheelchair to get back to room. Once in room, pt leaning to right side due to fatigue. To recliner with call bell. No c/o CP. Pt more appropriate for PT services at this time.   Graylon Good, RN BSN  10/28/2013 9:50 AM

## 2013-10-28 NOTE — Care Management Note (Addendum)
  Page 2 of 2   10/29/2013     4:56:06 PM   CARE MANAGEMENT NOTE 10/29/2013  Patient:  Mark French, Mark French   Account Number:  [id number]  Date Initiated:  10/28/2013  Documentation initiated by:  [facility]  Subjective/Objective Assessment:   68 year old male with a Hx of DM, HTN,, CKD stage III, chronic systolic HF with EF of 66%, CVA, PVD, complete heart block s/p pacemaker placement and CAD; Chief Complaint: SOB, CP//Home with spouse     Action/Plan:   Intravenous furosemide & soluMedrol 125 mg IV, heart cath//Access for Home Health needs   Anticipated DC Date:  10/29/2013   Anticipated DC Plan:  [place]  CM consult      [facility] Choice  HOME HEALTH   Choice offered to / List presented to:  C-3 Spouse   DME arranged  [facility]      DME agency  [place] arranged  HH-1 RN  HH-10 DISEASE MANAGEMENT      HH agency  [facility] [place]   Status of service:  In process, will continue to follow Medicare Important Message given?   (If response is "NO", the following Medicare IM given date fields will be blank) Date Medicare IM given:   Date Additional Medicare IM given:    Discharge Disposition:  [place]  Per UR Regulation:  Reviewed for med. necessity/level of care/duration of stay  If discussed at [place] of Stay Meetings, dates discussed:   10/30/2013  10/30/2013    Comments:  10/29/2013 LOS: 4 IOS: Lasix 40 IV Bid Patient/wife completed Palliative Consult and elects services with [facility]. Social:  From home with wife, ambulates via motorized scooter. Home DME:  cane, walker, motorized scooter. Sleeps in [place] and wife states this will be the plan at d/c and refuses Hospital Bed until later time. Home O2 needed and wife states plan for continue home compression boots lower extremities. (Hospice/[facility] state this is not a covered Hospice item).   Hospice DME PKG B ordered [place] [facility] [place], [place] Transportation Home:  Per PT: "He and his wife feel comfortable that they will be able to get him home in their vehicle and that he will use his motorized scooter to enter the home via the ramp."  CM confirmed with wife that this remains her plan and family members plan to be present to assist. Disposition Plan: Home Hospice Services:  Hospice nurse will need to see day of discharge if possible. Momin Misko RN, BSN, [place], [place] 10/29/2013

## 2013-10-28 NOTE — Discharge Instructions (Signed)
PLEASE REMEMBER TO BRING ALL OF YOUR MEDICATIONS TO EACH OF YOUR FOLLOW-UP OFFICE VISITS. ° °PLEASE ATTEND ALL SCHEDULED FOLLOW-UP APPOINTMENTS.  ° °Activity: Increase activity slowly as tolerated. You may shower, but no soaking baths (or swimming) for 1 week. No driving for 2 days. No lifting over 5 lbs for 1 week. No sexual activity for 1 week.  ° °You May Return to Work: in 1 week (if applicable) ° °Wound Care: You may wash cath site gently with soap and water. Keep cath site clean and dry. If you notice pain, swelling, bleeding or pus at your cath site, please call [phone]. ° ° ° °Cardiac Cath Site Care °Refer to this sheet in the next few weeks. These instructions provide you with information on caring for yourself after your procedure. Your caregiver may also give you more specific instructions. Your treatment has been planned according to current medical practices, but problems sometimes occur. Call your caregiver if you have any problems or questions after your procedure. °HOME CARE INSTRUCTIONS °· You may shower 24 hours after the procedure. Remove the bandage (dressing) and gently wash the site with plain soap and water. Gently pat the site dry.  °· Do not apply powder or lotion to the site.  °· Do not sit in a bathtub, swimming pool, or whirlpool for 5 to 7 days.  °· No bending, squatting, or lifting anything over 10 pounds (4.5 kg) as directed by your caregiver.  °· Inspect the site at least twice daily.  °· Do not drive home if you are discharged the same day of the procedure. Have someone else drive you.  °· You may drive 24 hours after the procedure unless otherwise instructed by your caregiver.  °What to expect: °· Any bruising will usually fade within 1 to 2 weeks.  °· Blood that collects in the tissue (hematoma) may be painful to the touch. It should usually decrease in size and tenderness within 1 to 2 weeks.  °SEEK IMMEDIATE MEDICAL CARE IF: °· You have unusual pain at the site or down the  affected limb.  °· You have redness, warmth, swelling, or pain at the site.  °· You have drainage (other than a small amount of blood on the dressing).  °· You have chills.  °· You have a fever or persistent symptoms for more than 72 hours.  °· You have a fever and your symptoms suddenly get worse.  °· Your leg becomes pale, cool, tingly, or numb.  °· You have heavy bleeding from the site. Hold pressure on the site.  °Document Released: 08/26/2010 Document Revised: 07/13/2011 Document Reviewed: 08/26/2010 °Exit[employer]® Patient Information ©2012 [employer]. ° °

## 2013-10-28 NOTE — Progress Notes (Signed)
Long discussion with patient, wife, and his pastor. They are agreeable to a more comfort-based approach to his care. He is nearing his baseline and I would anticipate he will be ready for discharge home in next 1-2 days. I think outpatient hospice would be appropriate for him. Will ask Palliative Medicine team to see him and help arrange. Please see my progress note for further details. I reviewed issues around DNR with the patient and would not want shocks, CPR, or mechanical ventilation. Will make DNR.  Sherren Mocha 10/28/2013 12:26 PM

## 2013-10-28 NOTE — Progress Notes (Signed)
Patient Name: Mark French   Date of Encounter: 10/28/2013  Principal Problem:  NSTEMI (non-ST elevated myocardial infarction)  Active Problems:  HYPERCHOLESTEROLEMIA  HYPERTENSION  Chronic kidney disease, stage III (moderate)  Chest pain  Complete heart block  Chronic pain syndrome  Acute on chronic combined systolic and diastolic heart failure  Acute respiratory failure  DM (diabetes mellitus) type II controlled, neurological manifestation  ARF (acute renal failure)   SUBJECTIVE:  No chest pain, still w/ SOB.   OBJECTIVE  Filed Vitals:    10/27/13 2009  10/28/13 0007  10/28/13 0221  10/28/13 0406   BP:  136/45    129/62   Pulse:  79  68   64   Temp:  97.6 F (36.4 C)  98.3 F (36.8 C)   97.9 F (36.6 C)   TempSrc:  Oral  Oral   Oral   Resp:  17  18   15    Height:       Weight:    221 lb 5.5 oz (100.4 kg)    SpO2:  97%  95%   99%     Intake/Output Summary (Last 24 hours) at 10/28/13 9518 Last data filed at 10/28/13 0600   Gross per 24 hour   Intake  673.17 ml   Output  4250 ml   Net  -3576.83 ml    Filed Weights    10/26/13 0500  10/27/13 0600  10/28/13 0221   Weight:  227 lb 11.8 oz (103.3 kg)  225 lb 8.5 oz (102.3 kg)  221 lb 5.5 oz (100.4 kg)    PHYSICAL EXAM  General: Well developed, well nourished, male in no acute distress.  Head: Normocephalic, atraumatic.  Neck: Supple without bruits, JVD at 10 cm.  Lungs: Resp regular and unlabored, decreased BS bases w/ some rales.  Heart: RRR, S1, S2, no S3, S4, or murmur; no rub.  Abdomen: Soft, non-tender, non-distended, BS + x 4.  Extremities: No clubbing, cyanosis, no edema.  Neuro: Alert and oriented X 3. Moves all extremities spontaneously.  Psych: Normal affect.   LABS:  CBC:  Recent Labs   10/25/13 0847  10/26/13 0600  10/27/13 0330   WBC  10.0  12.1*  11.5*   NEUTROABS  6.5  --  --   HGB  13.3  11.2*  10.8*   HCT  41.5  35.3*  34.6*   MCV  85.6  84.2  85.0   PLT  191  221  181    INR:    Recent Labs   10/25/13 0847   INR  8.41    Basic Metabolic Panel:  Recent Labs   10/27/13 0330  10/28/13 0435   NA  140  138   K  4.6  4.7   CL  99  99   CO2  29  24   GLUCOSE  137*  142*   BUN  30*  35*   CREATININE  1.30  1.16   CALCIUM  8.7  8.7    Cardiac Enzymes:  Recent Labs   10/25/13 0847  10/25/13 1730  10/26/13 1246   TROPONINI  <0.30  2.84*  5.32*    BNP:  Pro B Natriuretic peptide (BNP)   Date/Time  Value  Ref Range  Status   10/28/2013 4:35 AM  3022.0*  0 - 125 pg/mL  Final   10/25/2013 8:47 AM  936.7*  0 - 125 pg/mL  Final    Hemoglobin A1C:  Recent Labs   10/25/13 1730   HGBA1C  7.1*    Fasting Lipid Panel:  Lab Results   Component  Value  Date    CHOL  96  01/12/2012    HDL  45  01/12/2012    LDLCALC  30  01/12/2012    LDLDIRECT  158.5  07/19/2010    TRIG  107  01/12/2012    CHOLHDL  2.1  01/12/2012    TELE: AV pacing w/ PVCs   Cardiac cath: 10/27/2013  IMPRESSIONS: 1. 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 The mid LAD is totally occluded, circumflex 100% occluded proximally, and RCA occluded distally.  2. 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 The saphenous vein graft to the diagonal contains moderate disease, patent overlapping distal stents, and ends on a small diagonal with relatively poor runoff.  3. Left ventricular systolic dysfunction with evidence of heart failure denoted by significant LVEDP elevation of 33 mmHg  4. Patent LIMA to LAD. The LAD distal to the LIMA insertion is severely and diffusely diseased " diabetic pattern"  RECOMMENDATION: 1. Aggressive therapy of heart failure with additional diuresis as tolerated by blood pressure  2. I would not repeatedly perform angiography on this patient for enzyme leaks because of the diffuse nature of his coronary disease process.  3. Primary focus of therapy should be control of  symptoms with medications.   2D Echo: 10/27/2013  Study Conclusions - Left ventricle: Poor image quality Contrast helped a bit. Inferior wall appears severely hypokinetic The cavity size was moderately dilated. The estimated ejection fraction was 40%. - Mitral valve: Calcified annulus. Mildly thickened leaflets . Mild regurgitation. - Left atrium: The atrium was moderately dilated. - Atrial septum: No defect or patent foramen ovale was identified. - Pulmonary arteries: PA peak pressure: 33mm Hg (S).  Radiology/Studies:  Dg Chest Port 1 View  10/25/2013 CLINICAL DATA: Shortness of breath. EXAM: PORTABLE CHEST - 1 VIEW COMPARISON: August 18, 2013. FINDINGS: Status post coronary artery bypass graft. Left-sided pacemaker is unchanged in position. Increased diffuse interstitial densities are noted bilaterally concerning for worsening edema or less likely pneumonia. No pneumothorax or pleural effusion is noted. IMPRESSION: Increased bilateral interstitial densities most consistent with worsening pulmonary edema or less likely pneumonia. Electronically Signed By: Sabino Dick M.D. On: 10/25/2013 09:05   Current Medications:  .  aspirin EC  81 mg  Oral  QHS   .  atorvastatin  40 mg  Oral  q1800   .  calcitRIOL  0.25 mcg  Oral  Daily   .  carvedilol  3.125 mg  Oral  BID WC   .  clopidogrel  75 mg  Oral  Q breakfast   .  docusate sodium  100 mg  Oral  BID   .  enoxaparin (LOVENOX) injection  50 mg  Subcutaneous  Q24H   .  insulin aspart  0-15 Units  Subcutaneous  TID WC   .  insulin NPH Human  35 Units  Subcutaneous  QAC breakfast   .  isosorbide mononitrate  30 mg  Oral  Daily   .  losartan  25 mg  Oral  Daily   .  nystatin   Topical  TID   .  pantoprazole  40 mg  Oral  Daily   .  potassium chloride SA  20 mEq  Oral  BID  ASSESSMENT AND PLAN:  Principal Problem:  NSTEMI (non-ST elevated myocardial infarction) - diffuse disease, see cath impressions and recommendations above. He is on  aspirin, beta blocker, Plavix and statin. His EF was 35-40% and he is also on ARB. Blood pressure was low yesterday and he needs diuresis so we'll not make any additional medication changes at this time.  Active Problems:  HYPERCHOLESTEROLEMIA - on home statin, management per internal medicine  HYPERTENSION  Chronic kidney disease, stage III (moderate) - per internal medicine, BUN up slightly but creatinine decreased after can  Chest pain - see above  Complete heart block - pacer functioning well  Chronic pain syndrome - per internal medicine  Acute on chronic combined systolic and diastolic heart failure - M.D. advise on Lasix 40 mg IV twice a day for 24-48 hours, weight is down 10 pounds from admission but still feel he has some extra volume on board  Acute respiratory failure - per internal medicine, see above  DM (diabetes mellitus) type II controlled, neurological manifestation - per internal medicine team  ARF (acute renal failure) - management per IM  Signed,  Rosaria Ferries , PA-C  6:52 AM  10/28/2013  Patient seen, examined. Available data reviewed. Agree with findings, assessment, and plan as outlined by Rosaria Ferries, PA-C. The patient is well-known to me. I agree with the exam as documented above. Specifically, the patient has inspiratory crackles in both lower lung fields. He is dyspneic with minimal activity. He does have significant baseline chronic dyspnea, but symptoms are worse than usual. His LVEDP was markedly elevated yesterday. I agree that IV diuresis as indicated. In the past there has been a difficult balance with his kidney function.  In the big picture, there is not a lot we have to offer from an interventional perspective. The patient has advanced coronary artery disease and he has struggled with congestive heart failure over the last 2 years. He's been in and out of the emergency room and hospital. I think it would be reasonable to at least consider a more symptom  focused/palliative approach. I discussed this with the patient this morning, and he requests that we talk things over with his wife who is his caregiver. After that discussion, will consider a palliative medicine consult. In the meantime, will diurese with IV Lasix to try to improve his symptoms.  Sherren Mocha, M.D. 10/28/2013 10:42 AM

## 2013-10-28 NOTE — Progress Notes (Addendum)
Patient Name: Mark French Date of Encounter: 10/28/2013  Principal Problem:   NSTEMI (non-ST elevated myocardial infarction) Active Problems:   HYPERCHOLESTEROLEMIA   HYPERTENSION   Chronic kidney disease, stage III (moderate)   Chest pain   Complete heart block   Chronic pain syndrome   Acute on chronic combined systolic and diastolic heart failure   Acute respiratory failure   DM (diabetes mellitus) type II controlled, neurological manifestation   ARF (acute renal failure)    SUBJECTIVE: No chest pain, still w/ SOB.   OBJECTIVE Filed Vitals:   10/27/13 2009 10/28/13 0007 10/28/13 0221 10/28/13 0406  BP: 136/45   129/62  Pulse: 79 68  64  Temp: 97.6 F (36.4 C) 98.3 F (36.8 C)  97.9 F (36.6 C)  TempSrc: Oral Oral  Oral  Resp: 17 18  15   Height:      Weight:   221 lb 5.5 oz (100.4 kg)   SpO2: 97% 95%  99%    Intake/Output Summary (Last 24 hours) at 10/28/13 2426 Last data filed at 10/28/13 0600  Gross per 24 hour  Intake 673.17 ml  Output   4250 ml  Net -3576.83 ml   Filed Weights   10/26/13 0500 10/27/13 0600 10/28/13 0221  Weight: 227 lb 11.8 oz (103.3 kg) 225 lb 8.5 oz (102.3 kg) 221 lb 5.5 oz (100.4 kg)    PHYSICAL EXAM General: Well developed, well nourished, male in no acute distress. Head: Normocephalic, atraumatic.  Neck: Supple without bruits, JVD at 10 cm. Lungs:  Resp regular and unlabored, decreased BS bases w/ some rales. Heart: RRR, S1, S2, no S3, S4, or murmur; no rub. Abdomen: Soft, non-tender, non-distended, BS + x 4.  Extremities: No clubbing, cyanosis, no edema.  Neuro: Alert and oriented X 3. Moves all extremities spontaneously. Psych: Normal affect.  LABS: CBC: Recent Labs  10/25/13 0847 10/26/13 0600 10/27/13 0330  WBC 10.0 12.1* 11.5*  NEUTROABS 6.5  --   --   HGB 13.3 11.2* 10.8*  HCT 41.5 35.3* 34.6*  MCV 85.6 84.2 85.0  PLT 191 221 181   INR: Recent Labs  10/25/13 0847  INR 8.34   Basic Metabolic  Panel: Recent Labs  10/27/13 0330 10/28/13 0435  NA 140 138  K 4.6 4.7  CL 99 99  CO2 29 24  GLUCOSE 137* 142*  BUN 30* 35*  CREATININE 1.30 1.16  CALCIUM 8.7 8.7   Cardiac Enzymes: Recent Labs  10/25/13 0847 10/25/13 1730 10/26/13 1246  TROPONINI <0.30 2.84* 5.32*   BNP: Pro B Natriuretic peptide (BNP)  Date/Time Value Ref Range Status  10/28/2013  4:35 AM 3022.0* 0 - 125 pg/mL Final  10/25/2013  8:47 AM 936.7* 0 - 125 pg/mL Final   Hemoglobin A1C: Recent Labs  10/25/13 1730  HGBA1C 7.1*   Fasting Lipid Panel: Lab Results  Component Value Date   CHOL 96 01/12/2012   HDL 45 01/12/2012   LDLCALC 30 01/12/2012   LDLDIRECT 158.5 07/19/2010   TRIG 107 01/12/2012   CHOLHDL 2.1 01/12/2012   TELE:  AV pacing w/ PVCs      Cardiac cath: 10/27/2013 IMPRESSIONS: 1. 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 The mid LAD is totally occluded, circumflex 100% occluded proximally, and RCA occluded distally.  2. 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 The saphenous vein graft to the diagonal contains  moderate disease, patent overlapping distal stents, and ends on a small diagonal with relatively poor runoff.  3. Left ventricular systolic dysfunction with evidence of heart failure denoted by significant LVEDP elevation of 33 mmHg  4. Patent LIMA to LAD. The LAD distal to the LIMA insertion is severely and diffusely diseased " diabetic pattern"  RECOMMENDATION: 1. Aggressive therapy of heart failure with additional diuresis as tolerated by blood pressure  2. I would not repeatedly perform angiography on this patient for enzyme leaks because of the diffuse nature of his coronary disease process.  3. Primary focus of therapy should be control of symptoms with medications.   2D Echo: 10/27/2013 Study Conclusions - Left ventricle: Poor image quality Contrast helped a  bit. Inferior wall appears severely hypokinetic The cavity size was moderately dilated. The estimated ejection fraction was 40%. - Mitral valve: Calcified annulus. Mildly thickened leaflets . Mild regurgitation. - Left atrium: The atrium was moderately dilated. - Atrial septum: No defect or patent foramen ovale was identified. - Pulmonary arteries: PA peak pressure: 35mm Hg (S).  Radiology/Studies: Dg Chest Port 1 View 10/25/2013   CLINICAL DATA:  Shortness of breath.  EXAM: PORTABLE CHEST - 1 VIEW  COMPARISON:  August 18, 2013.  FINDINGS: Status post coronary artery bypass graft. Left-sided pacemaker is unchanged in position. Increased diffuse interstitial densities are noted bilaterally concerning for worsening edema or less likely pneumonia. No pneumothorax or pleural effusion is noted.  IMPRESSION: Increased bilateral interstitial densities most consistent with worsening pulmonary edema or less likely pneumonia.   Electronically Signed   By: Sabino Dick M.D.   On: 10/25/2013 09:05     Current Medications:  . aspirin EC  81 mg Oral QHS  . atorvastatin  40 mg Oral q1800  . calcitRIOL  0.25 mcg Oral Daily  . carvedilol  3.125 mg Oral BID WC  . clopidogrel  75 mg Oral Q breakfast  . docusate sodium  100 mg Oral BID  . enoxaparin (LOVENOX) injection  50 mg Subcutaneous Q24H  . insulin aspart  0-15 Units Subcutaneous TID WC  . insulin NPH Human  35 Units Subcutaneous QAC breakfast  . isosorbide mononitrate  30 mg Oral Daily  . losartan  25 mg Oral Daily  . nystatin   Topical TID  . pantoprazole  40 mg Oral Daily  . potassium chloride SA  20 mEq Oral BID      ASSESSMENT AND PLAN: Principal Problem:   NSTEMI (non-ST elevated myocardial infarction) - diffuse disease, see cath impressions and recommendations above. He is on aspirin, beta blocker, Plavix and statin. His EF was 35-40% and he is also on ARB. Blood pressure was low yesterday and he needs diuresis so we'll not make any  additional medication changes at this time.  Active Problems:   HYPERCHOLESTEROLEMIA - on home statin, management per internal medicine    HYPERTENSION    Chronic kidney disease, stage III (moderate) - per internal medicine, BUN up slightly but creatinine decreased after can    Chest pain - see above    Complete heart block - pacer functioning well    Chronic pain syndrome - per internal medicine    Acute on chronic combined systolic and diastolic heart failure - M.D. advise on Lasix 40 mg IV twice a day for 24-48 hours, weight is down 10 pounds from admission but still feel he has some extra volume on board    Acute respiratory failure - per internal medicine, see above  DM (diabetes mellitus) type II controlled, neurological manifestation - per internal medicine team    ARF (acute renal failure) - management per IM   Signed, Rosaria Ferries , PA-C 6:52 AM 10/28/2013  Pt initially seen, but will defer rounding note to Dr. Burt Knack, who has graciously agreed to see the patient.   Leonie Man, M.D., M.S. Interventional Cardiologist  [place] Pager # [phone] 10/28/2013

## 2013-10-28 NOTE — Progress Notes (Signed)
Thank you for consulting the Palliative Medicine Team at [facility] to meet your patient's and family's needs.   The reason that you asked [name] to see your patient is for [name] and hospice options.  We have scheduled your patient for a meeting: 3/25 1400 pm  The Surrogate decision make is: Mark French Contact information: [phone]  Other family members that need to be present: at family discretion   Your patient is able/unable to participate: able  Vinie Sill, NP Palliative Medicine Team Pager # [phone] (M-F 8a-5p) Team Phone # [phone] (Nights/Weekends)

## 2013-10-28 NOTE — Progress Notes (Addendum)
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 Patient was noted to be hypoxic initially requiring BiPAP and then wean down to 4 L, mild elevation of creatinine at 1.4 and felt to be in acute congestive heart failure. Patient the step down unit and was given diuretics. Cardiology was consulted. That evening, patient's troponin became elevated at 2.84. Heparin was started. Patient was chest pain-free. See my cardiology following diureses on 3/22, the patient's cardiac catheterization on 3/23. Catheterization noted diffuse triple vessel disease. After some discussion with family, they opted for comfort care and palliative medicine has been consulted.  Prior to catheterization, hospitalists had discussion with patient about his pain control for peripheral neuropathy.  Patient is taking Neurontin and q.6 hour Percocet. This is giving him little relief. We tried to alleviate this by increasing the patient's Neurontin and also started him on extended release MS Contin. This may change in the wake of palliative care meeting.   Assessment/Plan: Principal Problem:   NSTEMI (non-ST elevated myocardial infarction): Continue nitrates and heparin. Cath notes triple vessel disease. After since a discussion with cardiology, family opting for comfort care. Goals of care meeting with palliative medicine tomorrow Active Problems:   Chronic kidney disease, stage III (moderate): Creatinine actually improved with diuresis, creatinine improved today    Chest pain: Now chest pain-free. Non-STEMI.    Complete heart block   Chronic pain syndrome: From peripheral neuropathy. See below.    Acute on chronic combined systolic  and diastolic heart failure: Diuresed 7 m   Acute respiratory failure: Secondary to CHF    DM (diabetes mellitus) type II controlled, neurological manifestation/peripheral neuropathy: Continue insulin at reduced dose plus sliding scale. His neuropathy is quite .  Not oversedated.significant. He looks to be in chronic pain. CBGs better controlled. He takes Percocet every 4 hours plus Neurontin 600 3 times a day and prednisone she has little relief.  We'll try increasing Neurontin to 800 3 times a day and monitor for sedation. In addition, we'll try MS Contin extended release every 12 hours.  May make adjustments in medications with palliative and comfort care planned    ARF (acute renal failure): Improved with diuresis  Code Status: Full code  Family Communication: Wife at the bedside.  Disposition Plan: Likely discharge in the next one to 2 days   Consultants:  Cardiology  Palliative medicine  Procedures:  Cardiac catheterization done 3/23: Noting triple vessel disease  Antibiotics:  None  HPI/Subjective: Patient doing okay. No chest pain, breathing little bit more comfortably  Objective: Filed Vitals:   10/28/13 1300  BP: 106/56  Pulse: 68  Temp: 97.8 F (36.6 C)  Resp: 17    Intake/Output Summary (Last 24 hours) at 10/28/13 1842 Last data filed at 10/28/13 1700  Gross per 24 hour  Intake 664.17 ml  Output   2100 ml  Net -1435.83 ml   Filed Weights   10/26/13 0500 10/27/13 0600 10/28/13 0221  Weight: 103.3 kg (227 lb 11.8 oz) 102.3 kg (225 lb 8.5 oz) 100.4 kg (221 lb 5.5 oz)    Exam:   General:  Alert and oriented x3,  less anxious  Cardiovascular: Regular rate and rhythm, G4-W1, 2/6 systolic ejection murmur  Respiratory: Decreased breath  sounds bibasilar  Abdomen: Soft, nontender, nondistended, positive bowel sounds  Musculoskeletal: Signs of chronic peripheral neuropathy, decreased sensation, 1+ pitting edema   Data Reviewed: Basic Metabolic  Panel:  Recent Labs Lab 10/25/13 0847 10/26/13 0600 10/27/13 0330 10/28/13 0435  NA 137 141 140 138  K 3.7 4.3 4.6 4.7  CL 96 99 99 99  CO2 26 27 29 24   GLUCOSE 158* 191* 137* 142*  BUN 20 27* 30* 35*  CREATININE 1.42* 1.28 1.30 1.16  CALCIUM 9.6 8.9 8.7 8.7   Liver Function Tests: No results found for this basename: AST, ALT, ALKPHOS, BILITOT, PROT, ALBUMIN,  in the last 168 hours No results found for this basename: LIPASE, AMYLASE,  in the last 168 hours No results found for this basename: AMMONIA,  in the last 168 hours CBC:  Recent Labs Lab 10/25/13 0847 10/26/13 0600 10/27/13 0330  WBC 10.0 12.1* 11.5*  NEUTROABS 6.5  --   --   HGB 13.3 11.2* 10.8*  HCT 41.5 35.3* 34.6*  MCV 85.6 84.2 85.0  PLT 191 221 181   Cardiac Enzymes:  Recent Labs Lab 10/25/13 0847 10/25/13 1730 10/26/13 1246  TROPONINI <0.30 2.84* 5.32*   BNP (last 3 results)  Recent Labs  08/18/13 1232 10/25/13 0847 10/28/13 0435  PROBNP 608.1* 936.7* 3022.0*   CBG:  Recent Labs Lab 10/27/13 1749 10/27/13 2147 10/28/13 0814 10/28/13 1256 10/28/13 1825  GLUCAP 207* 224* 120* 126* 129*    Recent Results (from the past 240 hour(s))  MRSA PCR SCREENING     Status: None   Collection Time    10/25/13  3:04 PM      Result Value Ref Range Status   MRSA by PCR NEGATIVE  NEGATIVE Final   Comment:            The GeneXpert MRSA Assay (FDA     approved for NASAL specimens     only), is one component of a     comprehensive MRSA colonization     surveillance program. It is not     intended to diagnose MRSA     infection nor to guide or     monitor treatment for     MRSA infections.     Studies: No results found.  Scheduled Meds: . aspirin EC  81 mg Oral QHS  . atorvastatin  40 mg Oral q1800  . calcitRIOL  0.25 mcg Oral Daily  . carvedilol  3.125 mg Oral BID WC  . clopidogrel  75 mg Oral Q breakfast  . docusate sodium  100 mg Oral BID  . enoxaparin (LOVENOX) injection  50 mg  Subcutaneous Q24H  . furosemide  40 mg Intravenous BID  . insulin aspart  0-15 Units Subcutaneous TID WC  . insulin NPH Human  35 Units Subcutaneous QAC breakfast  . isosorbide mononitrate  30 mg Oral Daily  . losartan  25 mg Oral Daily  . nystatin   Topical TID  . pantoprazole  40 mg Oral Daily  . potassium chloride SA  20 mEq Oral BID   Continuous Infusions:    Principal Problem:   NSTEMI (non-ST elevated myocardial infarction) Active Problems:   Chronic kidney disease, stage III (moderate)   Chest pain   Complete heart block   Chronic pain syndrome   Acute on chronic combined systolic and diastolic heart failure   Acute respiratory failure   DM (diabetes mellitus) type II controlled, neurological manifestation   ARF (acute renal failure)  Time spent: 20 minutes    [facility] Pager [phone]. If 7PM-7AM, please contact night-coverage at [email or web address], password [facility] 10/28/2013, 6:42 PM  LOS: 3 days

## 2013-10-28 NOTE — Evaluation (Signed)
Physical Therapy Evaluation Patient Details Name: Mark French MRN: [id number] DOB: [birth year] Today's Date: 10/28/2013   History of Present Illness  68 year old male with a history of diabetes mellitus, hypertension, CKD stage III, chronic systolic HF with EF of 35%, CVA, PVD, complete heart block s/p pacemaker placement and CAD who was admitted on 08/18/13 for weakness and hypotension returns because of sudden onset of shortness of breath that began on the morning of admission Pt to have palliative care consult tomorrow  Clinical Impression  Mr. Luginbill is able to verbalize all the equipment he has at home and the exercises that he knows to do. He is able to demonstrate sit to stand without any assist.  He and his wife feel comfortable that they will be able to get him home in their vehicle and that he will use his motorized scooter to enter the home via the ramp. He feels he will feel better at home and be able to do what he needs to do    Follow Up Recommendations No PT follow up    Equipment Recommendations  None recommended by PT    Recommendations for Other Services       Precautions / Restrictions Precautions Precautions: Fall Restrictions Weight Bearing Restrictions: No      Mobility  Bed Mobility               General bed mobility comments: Pt sitting up in a chair and limits his active participation  Transfers Overall transfer level: Modified independent               General transfer comment: needs extra time  Ambulation/Gait             General Gait Details: pt deferred ambultation.  He states he holds onto furniture or uses a rolling walker  Stairs            Wheelchair Mobility    Modified Rankin (Stroke Patients Only)       Balance Overall balance assessment: Modified Independent;Independent;History of Falls                                   Pertinent Vitals/Pain Pt on supplemental O2    Home Living  Family/patient expects to be discharged to:: Private residence Living Arrangements: Spouse/significant other Available Help at Discharge: Family;Available PRN/intermittently Type of Home: House Home Access: Ramped entrance     Home Layout: One level Home Equipment: Walker - 2 wheels;Cane - quad;Cane - single point;Grab bars - toilet;Grab bars - tub/shower;Hand held shower head;Wheelchair - power Additional Comments: pt reports he has been through rehab at [facility] and has had home health PT in the past and he remembers what to do    Prior Function Level of Independence: Independent with assistive device(s)               Hand Dominance        Extremity/Trunk Assessment   Upper Extremity Assessment: RUE deficits/detail RUE Deficits / Details: pt has atrophy and decreased range of motion in RUE, "I had a stroke when I was 2 months old"         Lower Extremity Assessment: Generalized weakness;RLE deficits/detail;LLE deficits/detail      Cervical / Trunk Assessment: Other exceptions  Communication   Communication: No difficulties  Cognition Arousal/Alertness: Awake/alert Behavior During Therapy: WFL for tasks assessed/performed Overall Cognitive Status: Within Functional Limits for tasks assessed  General Comments      Exercises Other Exercises Other Exercises: reviewed short, frequent bouts of activity througout the day Other Exercises: reviewed standing balance for 30 seconds at atime with progression of time Other Exercises: reviewed repeated sit to stand      Assessment/Plan    PT Assessment Patent does not need any further PT services  PT Diagnosis     PT Problem List    PT Treatment Interventions     PT Goals (Current goals can be found in the Care Plan section) Acute Rehab PT Goals Patient Stated Goal: to go home and have Hospice care PT Goal Formulation: No goals set, d/c therapy    Frequency     Barriers to discharge         End of Session   Activity Tolerance: Patient limited by fatigue Patient left: in chair;with family/visitor present         Time: 4315-4008 PT Time Calculation (min): 21 min   Charges:   PT Evaluation $Initial PT Evaluation Tier I: 1 Procedure PT Treatments $Self Care/Home Management: 8-22   PT G Codes:          Norwood Levo 10/28/2013, 5:01 PM

## 2013-10-29 ENCOUNTER — Telehealth: Payer: Self-pay | Admitting: Cardiovascular Disease

## 2013-10-29 DIAGNOSIS — N183 Chronic kidney disease, stage 3 unspecified: Secondary | ICD-10-CM

## 2013-10-29 DIAGNOSIS — Z515 Encounter for palliative care: Secondary | ICD-10-CM

## 2013-10-29 DIAGNOSIS — Z66 Do not resuscitate: Secondary | ICD-10-CM | POA: Diagnosis not present

## 2013-10-29 LAB — GLUCOSE, CAPILLARY
Glucose-Capillary: 134 mg/dL — ABNORMAL HIGH (ref 70–99)
Glucose-Capillary: 80 mg/dL (ref 70–99)
Glucose-Capillary: 95 mg/dL (ref 70–99)
Glucose-Capillary: 102 mg/dL — ABNORMAL HIGH (ref 70–99)
Glucose-Capillary: 101 mg/dL — ABNORMAL HIGH (ref 70–99)

## 2013-10-29 LAB — BASIC METABOLIC PANEL
BUN: 36 mg/dL — AB (ref 6–23)
CALCIUM: 8.8 mg/dL (ref 8.4–10.5)
CO2: 30 mEq/L (ref 19–32)
Chloride: 97 mEq/L (ref 96–112)
Creatinine, Ser: 1.29 mg/dL (ref 0.50–1.35)
GFR, EST AFRICAN AMERICAN: 63 mL/min — AB (ref >90)
GFR, EST NON AFRICAN AMERICAN: 55 mL/min — AB (ref >90)
Glucose, Bld: 94 mg/dL (ref 70–99)
Potassium: 3.9 mEq/L (ref 3.7–5.3)
Sodium: 138 mEq/L (ref 137–147)

## 2013-10-29 MED ORDER — SENNOSIDES-DOCUSATE SODIUM 8.6-50 MG PO TABS
2.0000 | ORAL_TABLET | Freq: Two times a day (BID) | ORAL | Status: DC
  Administered 2013-10-29 – 2013-10-30 (×2): 2 via ORAL
  Filled 2013-10-29 (×4): qty 2

## 2013-10-29 MED ORDER — LORAZEPAM 0.5 MG PO TABS: 0.5000 mg | ORAL_TABLET | Freq: Four times a day (QID) | ORAL | Status: DC | PRN

## 2013-10-29 MED ORDER — MORPHINE SULFATE (CONCENTRATE) 10 MG /0.5 ML PO SOLN: 5.0000 mg | ORAL | Status: DC | PRN

## 2013-10-29 MED ORDER — POLYETHYLENE GLYCOL 3350 17 G PO PACK
17.0000 g | PACK | Freq: Every day | ORAL | Status: DC | PRN
  Filled 2013-10-29: qty 1

## 2013-10-29 MED ORDER — ONDANSETRON HCL 4 MG/2ML IJ SOLN: 4.0000 mg | Freq: Four times a day (QID) | INTRAMUSCULAR | Status: DC | PRN

## 2013-10-29 MED ORDER — GABAPENTIN 400 MG PO CAPS
800.0000 mg | ORAL_CAPSULE | Freq: Three times a day (TID) | ORAL | Status: DC
  Filled 2013-10-29 (×2): qty 2

## 2013-10-29 MED ORDER — GABAPENTIN 400 MG PO CAPS
800.0000 mg | ORAL_CAPSULE | Freq: Three times a day (TID) | ORAL | Status: DC
  Administered 2013-10-29 – 2013-10-30 (×5): 800 mg via ORAL
  Filled 2013-10-29 (×7): qty 2

## 2013-10-29 MED ORDER — ONDANSETRON HCL 4 MG/2ML IJ SOLN
INTRAMUSCULAR | Status: AC
  Administered 2013-10-29: 4 mg
  Filled 2013-10-29: qty 2

## 2013-10-29 MED ORDER — DIAZEPAM 5 MG PO TABS: 2.5000 mg | ORAL_TABLET | Freq: Four times a day (QID) | ORAL | Status: DC | PRN

## 2013-10-29 NOTE — Progress Notes (Signed)
Notified by [facility] patient/family request services of Hospice and [place] after discharge and plan is for discharge tomorrow, Thursday 3/26. As Probation officer currently at [facility] with another family; Probation officer did call and speak with Ms Remmers who was currently in patient's room- per discussion when discharged Ms Mergen plans to take pt home by personal vehicle;  -Discussed DME needs currently pt on O2 @ [facility] and will need complete Pkg B delivered to the home; pt currently has recliner, walker motorized scooter and walker in the home; declines need for hospital bed  *please contact Ms Knaus c: [phone] to arrange delivery time -Pt will need a travel O2 tank brought to the room prior to d/c to use for transport  -Discussed with Ms Adkison request for PAS hose at home- these compression stockings are not on the [facility] equipment list and writer has not seen these ordered for home use in the past -they are mostly seen in the hospital - Probation officer did inquire and learned from [facility] representative that [facility] does stock these for purchase from the store and other Medical Supply stores in the area have this item for purchase as well - this information was shared with Ms Hinch who will contact them directly for additional information. [facility] aware of referral and possibility of discharge tomorrow. Writer will see pt and wife in am to address any additional discharge questions/needs Danton Sewer, RN

## 2013-10-29 NOTE — Progress Notes (Signed)
Subjective: He reports right lower ext pain below the knee.   Objective: Vital signs in last 24 hours: Temp:  [97.3 F (36.3 C)-98.1 F (36.7 C)] 97.7 F (36.5 C) (03/25 0742) Pulse Rate:  [66-73] 66 (03/25 0742) Resp:  [15-20] 20 (03/25 0742) BP: (106-151)/(45-56) 131/49 mmHg (03/25 0742) SpO2:  [94 %-100 %] 97 % (03/25 0742) Weight:  [221 lb 1.9 oz (100.3 kg)] 221 lb 1.9 oz (100.3 kg) (03/24 2343) Last BM Date: 10/26/13  Intake/Output from previous day: 03/24 0701 - 03/25 0700 In: 700 [P.O.:700] Out: 1575 [Urine:1575] Intake/Output this shift:    Medications Current Facility-Administered Medications  Medication Dose Route Frequency Provider Last Rate Last Dose  . albuterol (PROVENTIL) (2.5 MG/3ML) 0.083% nebulizer solution 2.5 mg  2.5 mg Nebulization Q4H PRN Orson Eva, MD      . aspirin EC tablet 81 mg  81 mg Oral QHS Orson Eva, MD   81 mg at 10/26/13 2149  . atorvastatin (LIPITOR) tablet 40 mg  40 mg Oral q1800 Orson Eva, MD   40 mg at 10/28/13 1824  . calcitRIOL (ROCALTROL) capsule 0.25 mcg  0.25 mcg Oral Daily Orson Eva, MD   0.25 mcg at 10/29/13 0912  . carvedilol (COREG) tablet 3.125 mg  3.125 mg Oral BID WC Orson Eva, MD   3.125 mg at 10/29/13 0636  . clopidogrel (PLAVIX) tablet 75 mg  75 mg Oral Q breakfast Orson Eva, MD   75 mg at 10/29/13 0636  . docusate sodium (COLACE) capsule 100 mg  100 mg Oral BID Orson Eva, MD   100 mg at 10/29/13 0911  . enoxaparin (LOVENOX) injection 50 mg  50 mg Subcutaneous Q24H Annita Brod, MD   50 mg at 10/28/13 0618  . furosemide (LASIX) injection 40 mg  40 mg Intravenous BID Sherren Mocha, MD   40 mg at 10/29/13 0911  . HYDROcodone-acetaminophen (NORCO) 10-325 MG per tablet 1-2 tablet  1-2 tablet Oral Q4H PRN Annita Brod, MD   1 tablet at 10/29/13 0449  . insulin aspart (novoLOG) injection 0-15 Units  0-15 Units Subcutaneous TID WC Orson Eva, MD   2 Units at 10/28/13 1825  . insulin NPH Human (HUMULIN N,NOVOLIN N)  injection 35 Units  35 Units Subcutaneous QAC breakfast Orson Eva, MD   35 Units at 10/29/13 0913  . isosorbide mononitrate (IMDUR) 24 hr tablet 30 mg  30 mg Oral Daily Orson Eva, MD   30 mg at 10/29/13 0914  . LORazepam (ATIVAN) tablet 0.5-1 mg  0.5-1 mg Oral Q6H PRN Debbe Odea, MD      . losartan (COZAAR) tablet 25 mg  25 mg Oral Daily Orson Eva, MD   25 mg at 10/29/13 0915  . nystatin (MYCOSTATIN/NYSTOP) topical powder   Topical TID Evelene Croon Barrett, PA-C      . ondansetron (ZOFRAN) injection 4 mg  4 mg Intravenous Q6H PRN Tarri Fuller, PA-C      . pantoprazole (PROTONIX) EC tablet 40 mg  40 mg Oral Daily Orson Eva, MD   40 mg at 10/29/13 0919  . potassium chloride SA (K-DUR,KLOR-CON) CR tablet 20 mEq  20 mEq Oral BID Orson Eva, MD   20 mEq at 10/29/13 0915    PE: General appearance: alert, cooperative, no distress and He was asleep in the chair when I entered. Lungs: Decreased BS bilaterally,  mild rales,  No wheeze. Heart: regular rate and rhythm, S1, S2 normal, no murmur, click, rub or gallop  Abdomen: Big, +BS, nontender Extremities: <1+ LEE tense  Pulses: 2+ and symmetric Skin: Warm and dry.  Neurologic: Grossly normal  Lab Results:   Recent Labs  10/27/13 0330  WBC 11.5*  HGB 10.8*  HCT 34.6*  PLT 181   BMET  Recent Labs  10/27/13 0330 10/28/13 0435 10/29/13 0250  NA 140 138 138  K 4.6 4.7 3.9  CL 99 99 97  CO2 29 24 30   GLUCOSE 137* 142* 94  BUN 30* 35* 36*  CREATININE 1.30 1.16 1.29  CALCIUM 8.7 8.7 8.8   Cath impression 1.  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 The mid LAD is totally occluded, circumflex  100% occluded proximally, and RCA occluded distally.   2. 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 The saphenous vein graft to the diagonal contains moderate disease, patent overlapping  distal stents, and ends on a small diagonal with  relatively poor runoff.   3. Left ventricular systolic dysfunction with evidence of heart failure denoted by significant LVEDP elevation of 33  mmHg   4. Patent LIMA to LAD. The LAD distal to the LIMA insertion is severely and diffusely diseased " diabetic pattern" Assessment/Plan  Principal Problem:   NSTEMI (non-ST elevated myocardial infarction)  1.  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 Occluded SVG to RCA and Crx. SVG to the diagonal  contains moderate disease, patent overlapping distal stents. Patent LIMA to LAD. The LAD distal to the LIMA  insertion is severely and diffusely diseased " diabetic pattern"(Full cardiac cath impression above).   EF 35-40%,  ASA, lipitor, coreg, plavix.  Continue medical therapy.    Active Problems:   HYPERCHOLESTEROLEMIA  On lipitor     HYPERTENSION  BP controlled.  Coreg, lasix, Imdur 30, Cozaar 25   Chronic kidney disease, stage III (moderate)  Scr Stable   Chest pain  Pain free currently   Complete heart block  AV Pacing   Chronic pain syndrome   Acute on chronic combined systolic and diastolic heart failure  Net fluids: -0.9L/-7.3L, Some uncharted   IV Lasix at 40mg  BID.  Potassium 63meq BID.  BNP yesterday was 3022.  Continue current dosing of lasix and reevaluate in the AM. Will recheck BNP.    Acute respiratory failure   DM (diabetes mellitus) type II controlled, neurological manifestation  CBG controlled.  Humulin, 35u QAC breakfast.  Novolog.    ARF (acute renal failure)  Palliative medicine meeting at 1400hrs today.     LOS: 4 days    HAGER, BRYAN PA-C 10/29/2013 10:01 AM  Patient seen, examined. Available data reviewed. Agree with findings, assessment, and plan as outlined by Tarri Fuller, PA-C. The patient reports minimal improvement in his breathing despite further IV diuresis. Palliative care meeting this afternoon. The patient and his wife are in favor of a palliative approach to his care. I am hopeful that  he will be ready for discharge home tomorrow with hospice.   Sherren Mocha, M.D. 10/29/2013 1:30 PM

## 2013-10-29 NOTE — Progress Notes (Signed)
Upon receiving report from pt. Before pt. Arrived at unit I was advised pt. Would be non-Tele on this unit. The orders for pt. Also did not show Cardiac Monitoring as an active order.

## 2013-10-29 NOTE — Progress Notes (Signed)
Patient c/o lots of pain to right knee I reviewed his medications and his gabapentin stopped on 10/27/13 not sure why so I called Dr. Wynelle Cleveland and she ordered to start at the higher dose of 800 mg tid that was recommended on 10/26/13 by Dr. [place] Pink  hopefully this will help with some pain control.

## 2013-10-29 NOTE — Care Management Note (Signed)
  Page 1 of 1   10/29/2013     3:48:00 PM   CARE MANAGEMENT NOTE 10/29/2013  Patient:  Mark French, Mark French   Account Number:  [id number]  Date Initiated:  10/28/2013  Documentation initiated by:  [facility]  Subjective/Objective Assessment:   68 year old male with a Hx of DM, HTN,, CKD stage III, chronic systolic HF with EF of 51%, CVA, PVD, complete heart block s/p pacemaker placement and CAD; Chief Complaint: SOB, CP//Home with spouse     Action/Plan:   Intravenous furosemide & soluMedrol 125 mg IV, heart cath//Access for Home Health needs   Anticipated DC Date:  10/29/2013   Anticipated DC Plan:  [place]  CM consult      Choice offered to / List presented to:             Status of service:  In process, will continue to follow Medicare Important Message given?   (If response is "NO", the following Medicare IM given date fields will be blank) Date Medicare IM given:   Date Additional Medicare IM given:    Discharge Disposition:    Per UR Regulation:    If discussed at Long Length of Stay Meetings, dates discussed:   10/30/2013    Comments:  10/29/2013 LOS: 4 IOS: Lasix 40 IV Bid Patient/wife completed Palliative Consult and elects services with [facility]. Home DME:  cane, walker, motorized scooter. Sleeps in [place] and wife states this will be the plan at d/c and refuses Hospital Bed until later time. Home O2 needed and wife states plan for continue home compression boots lower extremities. Christen Bedoya RN, BSN, [place], [place] 10/29/2013

## 2013-10-29 NOTE — Telephone Encounter (Signed)
New message    Hospice calling    Patient will be discharge tomorrow. Will Dr. Burt Knack be the attending?    There MD will be assistance with management of symptoms ,would this be ok with Dr. Burt Knack.

## 2013-10-29 NOTE — Progress Notes (Signed)
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 Patient was noted to be hypoxic initially requiring BiPAP and then wean down to 4 L, mild elevation of creatinine at 1.4 and felt to be in acute congestive heart failure. Patient the step down unit and was given diuretics. Cardiology was consulted. That evening, patient's troponin became elevated at 2.84. Heparin was started. Patient was chest pain-free. See my cardiology following diureses on 3/22, the patient's cardiac catheterization on 3/23. Catheterization noted diffuse triple vessel disease. After some discussion with family, they opted for comfort care and palliative medicine has been consulted.  Prior to catheterization, hospitalists had discussion with patient about his pain control for peripheral neuropathy.  Patient is taking Neurontin and q.6 hour Percocet. This is giving him little relief. We tried to alleviate this by increasing the patient's Neurontin and also started him on extended release MS Contin. This may change in the wake of palliative care meeting.   Assessment/Plan: Principal Problem:   NSTEMI (non-ST elevated myocardial infarction): Continue nitrates and heparin. Cath notes triple vessel disease. After since a discussion with cardiology, family opting for comfort care. Goals of care meeting with palliative medicine was today and medical management is planned  Active Problems:   Chronic kidney disease, stage III (moderate): Creatinine actually improved with diuresis, creatinine improved today    Chest pain: Now chest pain-free. Non-STEMI.    Complete heart block   Chronic pain syndrome: From peripheral neuropathy. See  below.    Acute on chronic combined systolic and diastolic heart failure: Diuresed 7 m   Acute respiratory failure: Secondary to CHF- diuresis is ongoing    DM (diabetes mellitus) type II controlled, neurological manifestation/peripheral neuropathy: Continue insulin at reduced dose plus sliding scale. His neuropathy is quite .  He takes Percocet every 4 hours plus Neurontin 600 3 times a day and prednisone she has little relief.  We'll  increasing Neurontin to 800 3 times a day has helped - Roxanol added by palliative team  Anxiety - ativan changed to Valium    ARF (acute renal failure): Improved with diuresis  Code Status: Full code  Family Communication: Wife at the bedside.  Disposition Plan: Likely discharge tomorrow   Consultants:  Cardiology  Palliative medicine  Procedures:  Cardiac catheterization done 3/23: Noting triple vessel disease  Antibiotics:  None  HPI/Subjective: C/o Pain in legs- somehow, Neurontic d/c'd yesterday.   Objective: Filed Vitals:   10/29/13 1550  BP: 114/59  Pulse: 61  Temp:   Resp: 18    Intake/Output Summary (Last 24 hours) at 10/29/13 1714 Last data filed at 10/29/13 1345  Gross per 24 hour  Intake    560 ml  Output   1275 ml  Net   -715 ml   Filed Weights   10/27/13 0600 10/28/13 0221 10/28/13 2343  Weight: 102.3 kg (225 lb 8.5 oz) 100.4 kg (221 lb 5.5 oz) 100.3 kg (221 lb 1.9 oz)    Exam:   General:  Alert and oriented x3,  less anxious  Cardiovascular: Regular rate and rhythm, I4-P3, 2/6 systolic ejection murmur  Respiratory: Decreased breath sounds bibasilar  Abdomen: Soft, nontender, nondistended, positive bowel sounds  Musculoskeletal: Signs of chronic peripheral neuropathy, decreased sensation, 1+  pitting edema   Data Reviewed: Basic Metabolic Panel:  Recent Labs Lab 10/25/13 0847 10/26/13 0600 10/27/13 0330 10/28/13 0435 10/29/13 0250  NA 137 141 140 138 138  K 3.7 4.3 4.6 4.7 3.9  CL 96 99 99  99 97  CO2 26 27 29 24 30   GLUCOSE 158* 191* 137* 142* 94  BUN 20 27* 30* 35* 36*  CREATININE 1.42* 1.28 1.30 1.16 1.29  CALCIUM 9.6 8.9 8.7 8.7 8.8   Liver Function Tests: No results found for this basename: AST, ALT, ALKPHOS, BILITOT, PROT, ALBUMIN,  in the last 168 hours No results found for this basename: LIPASE, AMYLASE,  in the last 168 hours No results found for this basename: AMMONIA,  in the last 168 hours CBC:  Recent Labs Lab 10/25/13 0847 10/26/13 0600 10/27/13 0330  WBC 10.0 12.1* 11.5*  NEUTROABS 6.5  --   --   HGB 13.3 11.2* 10.8*  HCT 41.5 35.3* 34.6*  MCV 85.6 84.2 85.0  PLT 191 221 181   Cardiac Enzymes:  Recent Labs Lab 10/25/13 0847 10/25/13 1730 10/26/13 1246  TROPONINI <0.30 2.84* 5.32*   BNP (last 3 results)  Recent Labs  08/18/13 1232 10/25/13 0847 10/28/13 0435  PROBNP 608.1* 936.7* 3022.0*   CBG:  Recent Labs Lab 10/28/13 1256 10/28/13 1825 10/28/13 2109 10/29/13 0821 10/29/13 1257  GLUCAP 126* 129* 109* 80 95    Recent Results (from the past 240 hour(s))  MRSA PCR SCREENING     Status: None   Collection Time    10/25/13  3:04 PM      Result Value Ref Range Status   MRSA by PCR NEGATIVE  NEGATIVE Final   Comment:            The GeneXpert MRSA Assay (FDA     approved for NASAL specimens     only), is one component of a     comprehensive MRSA colonization     surveillance program. It is not     intended to diagnose MRSA     infection nor to guide or     monitor treatment for     MRSA infections.     Studies: No results found.  Scheduled Meds: . aspirin EC  81 mg Oral QHS  . atorvastatin  40 mg Oral q1800  . calcitRIOL  0.25 mcg Oral Daily  . carvedilol  3.125 mg Oral BID WC  . clopidogrel  75 mg Oral Q breakfast  . enoxaparin (LOVENOX) injection  50 mg Subcutaneous Q24H  . furosemide  40 mg Intravenous BID  . gabapentin  800 mg Oral TID  . insulin aspart  0-15 Units Subcutaneous TID WC  . insulin NPH Human   35 Units Subcutaneous QAC breakfast  . isosorbide mononitrate  30 mg Oral Daily  . losartan  25 mg Oral Daily  . nystatin   Topical TID  . pantoprazole  40 mg Oral Daily  . potassium chloride SA  20 mEq Oral BID  . senna-docusate  2 tablet Oral BID   PRN meds: albuterol, diazepam, HYDROcodone-acetaminophen, morphine CONCENTRATE, ondansetron (ZOFRAN) IV, polyethylene glycol    Time spent: 20 minutes    [facility] Pager [phone]. If 7PM-7AM, please contact night-coverage at [email or web address], password [facility] 10/29/2013, 5:14 PM  LOS: 4 days

## 2013-10-29 NOTE — Telephone Encounter (Signed)
Spoke with Vicky at [facility] and advised that per Dr. Burt Knack he will be attending and is agreeable to their MD assistance.  Vicky verbalized understanding and gratitude.

## 2013-10-29 NOTE — Consult Note (Signed)
Palliative Medicine Team Consult Note Requested by: Dr.M.  Burt Knack, Cardiology  Goals of Care meeting with patient and his wife.   Assessment: 68 yo with advanced ASCVD and CHF. Not a candidate for ongoing invasive interventions. Recommendations for focus on comfort and palliation. He is ambulatory with assist now and PTA.  Plan: 1. DNR, has [employer] PPM   2. Disposition: Home with Hospice. I discussed Hospice philosophy in detail. I also discussed prognostic uncertainty in general with heart failure but it is very reasonable to think that in his current state and with his level of debility that he is very appropriate for Hospice Care with a prognosis of <6 months. Educated patient about the unpredictable, but usually terminal nature of HF, and the ever present danger of sudden cardiac death (even when feeling well). Hospice nurse will need to see day of discharge if possible.  4. Symptom management as follows:  Neuropathic pain, legs:  He thinks the PAS has helped more than anything wants to know if he can get this at home.  Continue Gabapentin (was just restarted) dose at max for renal function-he was off this since admission and just recently re-started -rapid titration can cause drowsiness-will just monitor for and if drowsiness continues may cut back and slowly increase.  Changed his Ativan to Diazepam which is safer and less toxic with renal issues- for his anxiety, distress, and muscle spasms.  He has been taking hydrocodone for for many years PRN for pain-this is helpful  I will add on low dose SL Roxanol for breakthrough pain or for pain over an above what his safe dose of ACETOMINOPHEN. This will help with dyspnea as well.  Could consider low dose Cymbalta, but probably will not confer timely benefit   Topical Capsacin applied twice daily may also help.  Dyspnea:  Optimize dose of oral loop diuretic (e.g. Furosemide)  Home O2 PRN for comfort  Nasal Saline/Afrin if  needed OTC  Shoulder/Neck Pain:  Prior ACDF, stable reviewed CT with his wife-no need to see NS as outpatient muscle spasm predominant pain  Needs Neck Support pillow    Thank you for consultation. Will follow until discharge. Ok to move to [employer] bed.  2PM-315PM. 75 minutes. Greater than 50%  of this time was spent counseling and coordinating care related to the above assessment and plan.    Lane Hacker, DO Palliative Medicine [phone]

## 2013-10-29 NOTE — Progress Notes (Signed)
Inpatient Diabetes Program Recommendations  AACE/ADA: New Consensus Statement on Inpatient Glycemic Control (2013)  Target Ranges:  Prepandial:   less than 140 mg/dL      Peak postprandial:   less than 180 mg/dL (1-2 hours)      Critically ill patients:  140 - 180 mg/dL   Received page from RN regarding whether NPH should be given with a blood sugar of 80mg /dL. Pt will be eating today and this Diabetes Coordinator recommended giving pt the ordered dose of NPH 35 units QAM.  Thank you. Lorenda Peck, RD, LDN, CDE Inpatient Diabetes Coordinator [phone]

## 2013-10-30 DIAGNOSIS — N179 Acute kidney failure, unspecified: Secondary | ICD-10-CM

## 2013-10-30 LAB — GLUCOSE, CAPILLARY
GLUCOSE-CAPILLARY: 179 mg/dL — AB (ref 70–99)
Glucose-Capillary: 103 mg/dL — ABNORMAL HIGH (ref 70–99)
Glucose-Capillary: 221 mg/dL — ABNORMAL HIGH (ref 70–99)

## 2013-10-30 LAB — PRO B NATRIURETIC PEPTIDE: Pro B Natriuretic peptide (BNP): 894.5 pg/mL — ABNORMAL HIGH (ref 0–125)

## 2013-10-30 MED ORDER — INSULIN NPH (HUMAN) (ISOPHANE) 100 UNIT/ML ~~LOC~~ SUSP: 35.0000 [IU] | Freq: Every day | SUBCUTANEOUS | Status: DC

## 2013-10-30 MED ORDER — POTASSIUM CHLORIDE CRYS ER 20 MEQ PO TBCR
20.0000 meq | EXTENDED_RELEASE_TABLET | Freq: Every day | ORAL | Status: DC
Start: 2013-10-31 — End: 2013-10-30

## 2013-10-30 MED ORDER — GABAPENTIN 400 MG PO CAPS: 800.0000 mg | ORAL_CAPSULE | Freq: Three times a day (TID) | ORAL | Status: DC

## 2013-10-30 MED ORDER — FUROSEMIDE 40 MG PO TABS: 40.0000 mg | ORAL_TABLET | Freq: Two times a day (BID) | ORAL | Status: DC

## 2013-10-30 MED ORDER — MORPHINE SULFATE (CONCENTRATE) 10 MG /0.5 ML PO SOLN: 5.0000 mg | ORAL | Status: AC | PRN

## 2013-10-30 MED ORDER — FUROSEMIDE 40 MG PO TABS: 80.0000 mg | ORAL_TABLET | Freq: Two times a day (BID) | ORAL | Status: DC

## 2013-10-30 MED ORDER — FUROSEMIDE 40 MG PO TABS
40.0000 mg | ORAL_TABLET | Freq: Two times a day (BID) | ORAL | Status: DC
  Filled 2013-10-30 (×3): qty 1

## 2013-10-30 MED ORDER — DIAZEPAM 5 MG PO TABS: 2.5000 mg | ORAL_TABLET | Freq: Four times a day (QID) | ORAL | Status: DC | PRN

## 2013-10-30 NOTE — Discharge Summary (Signed)
Physician Discharge Summary  Mark French [id number] DOB: 1946/02/19 DOA: 10/25/2013  PCP: [name], MD  Admit date: 10/25/2013 Discharge date: 10/30/2013  Time spent: >45 minutes  Discharge Diagnoses:  Principal Problem:   NSTEMI (non-ST elevated myocardial infarction) Active Problems:   HYPERCHOLESTEROLEMIA   HYPERTENSION   Chronic kidney disease, stage III (moderate)   Chest pain   Chronic pain syndrome   Acute on chronic combined systolic and diastolic heart failure   Acute respiratory failure   DM (diabetes mellitus) type II controlled, neurological manifestation   ARF (acute renal failure)   DNR (do not resuscitate)   Discharge Condition: stable  Diet recommendation: heart healthy  Filed Weights   10/28/13 2343 10/29/13 1744 10/30/13 0637  Weight: 100.3 kg (221 lb 1.9 oz) 98.476 kg (217 lb 1.6 oz) 100.3 kg (221 lb 1.9 oz)    History of present illness:  68 yo white male with past medical history of CAD, chronic kidney disease and 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 Patient was noted to be hypoxic initially requiring BiPAP and then wean down to 4 L, mild elevation of creatinine at 1.4 and felt to be in acute congestive heart failure. Patient the step down unit and was given diuretics. Cardiology was consulted. That evening, patient's troponin became elevated at 2.84. Heparin was started. Patient was chest pain-free. See my cardiology following diureses on 3/22, the patient's cardiac catheterization on 3/23. Catheterization noted diffuse triple vessel disease. After some discussion with family, they opted for comfort care and palliative medicine has been consulted.  Prior to catheterization, hospitalists had discussion with patient about his pain control for peripheral neuropathy. Patient is taking Neurontin and q.6 hour Percocet. This is giving him little relief. We  tried to alleviate this by increasing the patient's Neurontin and also started him on extended release MS Contin. This may change in the wake of palliative care meeting.   Hospital Course:  Principal Problem:  NSTEMI (non-ST elevated myocardial infarction):  Cath notes triple vessel disease. After since a discussion with cardiology, family opting for comfort care.   Active Problems:  ARF with Chronic kidney disease, stage III (moderate): Creatinine actually improved with diuresis, creatinine improved    Complete heart block   Chronic pain syndrome: From peripheral neuropathy. See below.   Acute respiratory failure requiring BiPAP Acute on chronic combined systolic and diastolic heart failure --Diuresed--  8300 cc negative balance  DM (diabetes mellitus) type II controlled, neurological manifestation/peripheral neuropathy: Continue insulin at reduced dose plus sliding scale. His neuropathy is quite bad . He takes Percocet every 4 hours plus Neurontin 600 3 times a day and prednisone she has little relief. -increasing Neurontin to 800 3 times a day has helped  - Roxanol added by palliative team    Procedures: Cardiac cath 3/23--Severe diffuse native diabetic disease involving all vessels. The mid LAD is totally occluded, circumflex 100% occluded proximally, and RCA occluded distally.  2. 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 The saphenous vein graft to the diagonal contains moderate disease, patent overlapping distal stents, and ends on a small diagonal with relatively poor runoff.  3. Left ventricular systolic dysfunction with evidence of heart failure denoted by significant LVEDP elevation of 33 mmHg  4. Patent LIMA to LAD. The LAD distal to the LIMA insertion is severely  and diffusely diseased " diabetic pattern"   Consultations:  cardiology  Discharge Exam: Filed Vitals:   10/30/13  0915  BP: 128/43  Pulse:   Temp: 97.8 F (36.6 C)  Resp: 20    General: Alert and oriented x3, less anxious  Cardiovascular: Regular rate and rhythm, N8-G9, 2/6 systolic ejection murmur , no pedal edema Respiratory: Decreased breath sounds bibasilar  Abdomen: Soft, nontender, nondistended, positive bowel sounds  Musculoskeletal: Signs of chronic peripheral neuropathy, decreased sensation,      Discharge Instructions   Future Appointments Provider Department Dept Phone   11/17/2013 10:15 AM Imogene Burn, PA-C [place] [phone]   01/15/2014 10:00 AM Sherren Mocha, MD Start Office [phone]       Medication List    ASK your doctor about these medications       albuterol 108 (90 BASE) MCG/ACT inhaler  Commonly known as:  PROVENTIL HFA;VENTOLIN HFA  Inhale 2 puffs into the lungs every 6 (six) hours as needed for wheezing or shortness of breath.     albuterol (2.5 MG/3ML) 0.083% nebulizer solution  Commonly known as:  PROVENTIL  Take 2.5 mg by nebulization 2 (two) times daily as needed for wheezing or shortness of breath.     aspirin EC 81 MG tablet  Take 81 mg by mouth at bedtime.     atorvastatin 40 MG tablet  Commonly known as:  LIPITOR  TAKE 1 TABLET BY MOUTH EVERY DAY     budesonide 0.25 MG/2ML nebulizer solution  Commonly known as:  PULMICORT  Take 0.25 mg by nebulization daily as needed (shortness of breath).     calcitRIOL 0.25 MCG capsule  Commonly known as:  ROCALTROL  Take 1 capsule (0.25 mcg total) by mouth daily.     carvedilol 3.125 MG tablet  Commonly known as:  COREG  Take 1 tablet (3.125 mg total) by mouth 2 (two) times daily with a meal.     clopidogrel 75 MG tablet  Commonly known as:  PLAVIX  Take 75 mg by mouth daily with breakfast.     co-enzyme Q-10 30 MG capsule  Take 30 mg by mouth daily at 2 PM daily at 2 PM.     docusate sodium 100 MG capsule  Commonly known as:  COLACE  Take 100  mg by mouth 2 (two) times daily as needed for constipation.     gabapentin 600 MG tablet  Commonly known as:  NEURONTIN  TAKE 1 TABLET BY MOUTH THREE TIMES DAILY     HYDROcodone-acetaminophen 10-325 MG per tablet  Commonly known as:  NORCO  Take 1-2 tablets by mouth every 4 (four) hours as needed for moderate pain.     insulin NPH Human 100 UNIT/ML injection  Commonly known as:  HUMULIN N,NOVOLIN N  Inject 70 Units into the skin daily before breakfast.     ipratropium 0.02 % nebulizer solution  Commonly known as:  ATROVENT  Take 500 mcg by nebulization every 6 (six) hours as needed for wheezing or shortness of breath.     isosorbide mononitrate 30 MG 24 hr tablet  Commonly known as:  IMDUR  TAKE 1 TABLET BY MOUTH EVERY DAY     losartan 50 MG tablet  Commonly known as:  COZAAR  Take 0.5 tablets (25 mg total) by mouth daily.     nitroGLYCERIN 0.4 MG SL tablet  Commonly known as:  NITROSTAT  Place 1 tablet (0.4 mg total) under the  tongue every 5 (five) minutes as needed for chest pain.     OVER THE COUNTER MEDICATION  Apply 1 application topically daily as needed. Anti fungal cream     OVER THE COUNTER MEDICATION  Place 1 drop into both eyes daily as needed (dry eyes). [employer] eye drops     pantoprazole 40 MG tablet  Commonly known as:  PROTONIX  TAKE 1 TABLET BY MOUTH EVERY DAY     potassium chloride SA 20 MEQ tablet  Commonly known as:  K-DUR,KLOR-CON  Take 20 mEq by mouth 2 (two) times daily.     RAPAFLO 8 MG Caps capsule  Generic drug:  silodosin  Take 8 mg by mouth at bedtime.     torsemide 20 MG tablet  Commonly known as:  DEMADEX  TAKE 3 TABLETS BY MOUTH EVERY MORNING AND 3 TABLETS BY MOUTH IN THE AFTERNOON AROUND 2 PM       Allergies  Allergen Reactions  . Sildenafil Nausea Only and Other (See Comments)    "took it once; heart went to pieces; never took it again"  . Percocet [Oxycodone-Acetaminophen] Other (See Comments)    Unknown reaction-possible  altered mental state-per wife.    . Contrast Media [Iodinated Diagnostic Agents] Nausea Only       Follow-up Information   Follow up with Ermalinda Barrios, PA-C On 11/17/2013. (See for Dr. Burt Knack at 10:15 AM)    Specialty:  Cardiology   Contact information:   [address] [place] [address] [phone]       Follow up with Hospice at [facility]. (Home Hospice Services to start within 24 hours of discharge. )    Specialty:  Hospice and Palliative Medicine   Contact information:   [address] [place] [id number] [phone]        The results of significant diagnostics from this hospitalization (including imaging, microbiology, ancillary and laboratory) are listed below for reference.    Significant Diagnostic Studies: Ct Cervical Spine Wo Contrast  10/13/2013   CLINICAL DATA:  Neck pain.  Cervical fusion  EXAM: CT CERVICAL SPINE WITHOUT CONTRAST  TECHNIQUE: Multidetector CT imaging of the cervical spine was performed without intravenous contrast. Multiplanar CT image reconstructions were also generated.  COMPARISON:  Cervical spine radiographs 01/22/2012, CT cervical spine 08/29/2011  FINDINGS: ACDF with anterior plate and screws at 075-GRM and C4-5. There is mild cervical kyphosis. Normal alignment. No fracture or mass. Mild dextroscoliosis.  14 mm left thyroid nodule is stable. 22 mm right thyroid nodule is stable.  C2-3: Mild disc degeneration and disc bulging with mild spinal stenosis.  C3-4: ACDF with anterior plate and screws.  Solid interbody fusion  C4-5: ACDF with anterior plate and screws. Persistent lucency through the disc space consistent with pseudarthrosis. Right C5 screw has backed out of the plate by approximately 7 mm, unchanged from 2013.  C5-6:  Mild disc degeneration and mild spondylosis.  C6-7: Disc degeneration and spondylosis without significant spinal stenosis.  C7-T1:  Negative  IMPRESSION: Bilateral thyroid nodules are stable.  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 Right C5 screw backout of the plate, unchanged from prior studies.  Cervical spondylosis at C6-7 is unchanged without significant spinal stenosis.   Electronically Signed   By: Franchot Gallo M.D.   On: 10/13/2013 16:28   Dg Chest Port 1 View  10/25/2013   CLINICAL DATA:  Shortness of breath.  EXAM: PORTABLE CHEST - 1 VIEW  COMPARISON:  August 18, 2013.  FINDINGS: Status post coronary artery bypass graft. Left-sided pacemaker is unchanged in position. Increased diffuse interstitial densities are noted bilaterally concerning for worsening edema or less likely pneumonia. No pneumothorax or pleural effusion is noted.  IMPRESSION: Increased bilateral interstitial densities most consistent with worsening pulmonary edema or less likely pneumonia.   Electronically Signed   By: Sabino Dick M.D.   On: 10/25/2013 09:05    Microbiology: Recent Results (from the past 240 hour(s))  MRSA PCR SCREENING     Status: None   Collection Time    10/25/13  3:04 PM      Result Value Ref Range Status   MRSA by PCR NEGATIVE  NEGATIVE Final   Comment:            The GeneXpert MRSA Assay (FDA     approved for NASAL specimens     only), is one component of a     comprehensive MRSA colonization     surveillance program. It is not     intended to diagnose MRSA     infection nor to guide or     monitor treatment for     MRSA infections.     Labs: Basic Metabolic Panel:  Recent Labs Lab 10/25/13 0847 10/26/13 0600 10/27/13 0330 10/28/13 0435 10/29/13 0250  NA 137 141 140 138 138  K 3.7 4.3 4.6 4.7 3.9  CL 96 99 99 99 97  CO2 26 27 29 24 30   GLUCOSE 158* 191* 137* 142* 94  BUN 20 27* 30* 35* 36*  CREATININE 1.42* 1.28 1.30 1.16 1.29  CALCIUM 9.6 8.9 8.7 8.7 8.8   Liver Function Tests: No results found for this basename: AST, ALT, ALKPHOS, BILITOT, PROT, ALBUMIN,  in the last 168 hours No results found for this basename: LIPASE, AMYLASE,   in the last 168 hours No results found for this basename: AMMONIA,  in the last 168 hours CBC:  Recent Labs Lab 10/25/13 0847 10/26/13 0600 10/27/13 0330  WBC 10.0 12.1* 11.5*  NEUTROABS 6.5  --   --   HGB 13.3 11.2* 10.8*  HCT 41.5 35.3* 34.6*  MCV 85.6 84.2 85.0  PLT 191 221 181   Cardiac Enzymes:  Recent Labs Lab 10/25/13 0847 10/25/13 1730 10/26/13 1246  TROPONINI <0.30 2.84* 5.32*   BNP: BNP (last 3 results)  Recent Labs  10/25/13 0847 10/28/13 0435 10/30/13 0708  PROBNP 936.7* 3022.0* 894.5*   CBG:  Recent Labs Lab 10/29/13 1719 10/29/13 2221 10/30/13 0610 10/30/13 1017 10/30/13 1125  GLUCAP 102* 101* 103* 221* 179*       SignedDebbe Odea, MD [facility] 10/30/2013, 2:15 PM

## 2013-10-30 NOTE — Progress Notes (Signed)
Subjective: Feels better today.  He had a large BM .  Slept well in the bed.  Objective: Vital signs in last 24 hours: Temp:  [97.3 F (36.3 C)-98.2 F (36.8 C)] 97.8 F (36.6 C) (03/26 0915) Pulse Rate:  [61-72] 72 (03/26 0637) Resp:  [18-20] 20 (03/26 0915) BP: (89-128)/(43-66) 128/43 mmHg (03/26 0915) SpO2:  [97 %-100 %] 100 % (03/26 0915) Weight:  [217 lb 1.6 oz (98.476 kg)-221 lb 1.9 oz (100.3 kg)] 221 lb 1.9 oz (100.3 kg) (03/26 0637) Last BM Date: 10/29/13  Intake/Output from previous day: 03/25 0701 - 03/26 0700 In: 682 [P.O.:682] Out: 1250 [Urine:1250] Intake/Output this shift: Total I/O In: 240 [P.O.:240] Out: -   Medications Current Facility-Administered Medications  Medication Dose Route Frequency Provider Last Rate Last Dose  . albuterol (PROVENTIL) (2.5 MG/3ML) 0.083% nebulizer solution 2.5 mg  2.5 mg Nebulization Q4H PRN Orson Eva, MD      . aspirin EC tablet 81 mg  81 mg Oral QHS Orson Eva, MD   81 mg at 10/29/13 2219  . atorvastatin (LIPITOR) tablet 40 mg  40 mg Oral q1800 Orson Eva, MD   40 mg at 10/29/13 2219  . calcitRIOL (ROCALTROL) capsule 0.25 mcg  0.25 mcg Oral Daily Orson Eva, MD   0.25 mcg at 10/30/13 0917  . carvedilol (COREG) tablet 3.125 mg  3.125 mg Oral BID WC Orson Eva, MD   3.125 mg at 10/30/13 0653  . clopidogrel (PLAVIX) tablet 75 mg  75 mg Oral Q breakfast Orson Eva, MD   75 mg at 10/30/13 0654  . diazepam (VALIUM) tablet 2.5-5 mg  2.5-5 mg Oral Q6H PRN Acquanetta Chain, DO      . enoxaparin (LOVENOX) injection 50 mg  50 mg Subcutaneous Q24H Annita Brod, MD   50 mg at 10/30/13 0703  . furosemide (LASIX) injection 40 mg  40 mg Intravenous BID Sherren Mocha, MD   40 mg at 10/30/13 6270  . gabapentin (NEURONTIN) capsule 800 mg  800 mg Oral TID Debbe Odea, MD   800 mg at 10/30/13 0918  . HYDROcodone-acetaminophen (NORCO) 10-325 MG per tablet 1-2 tablet  1-2 tablet Oral Q4H PRN Annita Brod, MD   2 tablet at 10/29/13 1651  .  insulin aspart (novoLOG) injection 0-15 Units  0-15 Units Subcutaneous TID WC Orson Eva, MD   2 Units at 10/28/13 1825  . insulin NPH Human (HUMULIN N,NOVOLIN N) injection 35 Units  35 Units Subcutaneous QAC breakfast Orson Eva, MD   35 Units at 10/29/13 0913  . isosorbide mononitrate (IMDUR) 24 hr tablet 30 mg  30 mg Oral Daily Orson Eva, MD   30 mg at 10/30/13 3500  . losartan (COZAAR) tablet 25 mg  25 mg Oral Daily Orson Eva, MD   25 mg at 10/30/13 0919  . morphine CONCENTRATE 10 mg / 0.5 ml oral solution 5-10 mg  5-10 mg Sublingual Q2H PRN Acquanetta Chain, DO      . nystatin (MYCOSTATIN/NYSTOP) topical powder   Topical TID Evelene Croon Barrett, PA-C      . ondansetron (ZOFRAN) injection 4 mg  4 mg Intravenous Q6H PRN Tarri Fuller, PA-C      . pantoprazole (PROTONIX) EC tablet 40 mg  40 mg Oral Daily Orson Eva, MD   40 mg at 10/30/13 0919  . polyethylene glycol (MIRALAX / GLYCOLAX) packet 17 g  17 g Oral Daily PRN Acquanetta Chain, DO      .  potassium chloride SA (K-DUR,KLOR-CON) CR tablet 20 mEq  20 mEq Oral BID Orson Eva, MD   20 mEq at 10/30/13 0920  . senna-docusate (Senokot-S) tablet 2 tablet  2 tablet Oral BID Acquanetta Chain, DO   2 tablet at 10/30/13 0919    PE: General appearance: alert, cooperative, no distress  Lungs: Decreased BS bilaterally, No wheeze.  Heart: regular rate and rhythm, S1, S2 normal, no murmur, click, rub or gallop  Abdomen: Big, +BS, nontender  Extremities: No  LEE   Pulses: 2+ and symmetric  Skin: Warm and dry.  Neurologic: Grossly normal      Lab Results:  No results found for this basename: WBC, HGB, HCT, PLT,  in the last 72 hours BMET  Recent Labs  10/28/13 0435 10/29/13 0250  NA 138 138  K 4.7 3.9  CL 99 97  CO2 24 30  GLUCOSE 142* 94  BUN 35* 36*  CREATININE 1.16 1.29  CALCIUM 8.7 8.8      Assessment/Plan   Principal Problem:  NSTEMI (non-ST elevated myocardial infarction)   Severe diffuse native diabetic disease  involving all vessels. Occluded SVG to RCA and Crx. SVG to the diagonal  contains moderate disease, patent overlapping distal stents. Patent LIMA to LAD. The LAD distal to the LIMA  insertion is severely and diffusely diseased " diabetic pattern"(Full cardiac cath impression above). EF 35-40%,  ASA, lipitor, coreg, plavix. Continue medical therapy.   Active Problems:  Acute on chronic combined systolic and diastolic heart failure   Net fluids: -0.6L/-7.9L, Some uncharted.  IV Lasix at 40mg  BID. Potassium 8meq BID.  Change to daily.  BNP  decreased from 3022 to 894 today.   Will DC IV lasix and change to PO 40mg  BID.  Reinforced daily weight and low  sodium diet.  His scale is electronic and transmits data to insurance company.    HYPERCHOLESTEROLEMIA   On lipitor  HYPERTENSION   BP controlled. Coreg, lasix, Imdur 30, Cozaar 25  Chronic kidney disease, stage III (moderate)   Slight increase in Scr 1.29  Chest pain   Pain free currently  Complete heart block   AV Pacing  Chronic pain syndrome   Gabapentin, Morphine  Acute respiratory failure  DM (diabetes mellitus) type II controlled, neurological manifestation   CBG controlled. Humulin, 35u QAC breakfast. Novolog.  ARF (acute renal failure)        LOS: 5 days    HAGER, BRYAN PA-C 10/30/2013 9:42 AM  Patient seen, examined. Available data reviewed. Agree with findings, assessment, and plan as outlined by Tarri Fuller, PA-C. The patient was independently interviewed and examined. He is alert and oriented. Lungs are clear. Heart is regular rate and rhythm without murmur or gallop. Jugular venous pressure appears normal. Peripheral edema is greatly improved.  I have reviewed notes from palliative care and appreciate their consultation. The patient is stable for discharge today from my perspective. Would send home on furosemide 80 mg twice daily. He is going to be under hospice care. Will arrange a hospital followup visit in my office.  Note he does complain of somnolence. Wonder if this is related to an increase in his gabapentin dose. Will defer management to the internal medicine service.  Sherren Mocha, M.D. 10/30/2013 11:16 AM

## 2013-10-31 ENCOUNTER — Other Ambulatory Visit: Payer: Self-pay | Admitting: Cardiovascular Disease

## 2013-11-04 ENCOUNTER — Other Ambulatory Visit: Payer: Self-pay | Admitting: Cardiovascular Disease

## 2013-11-10 ENCOUNTER — Other Ambulatory Visit: Payer: Self-pay | Admitting: Cardiovascular Disease

## 2013-11-10 LAB — HM DIABETES EYE EXAM

## 2013-11-11 ENCOUNTER — Telehealth: Payer: Self-pay | Admitting: Cardiovascular Disease

## 2013-11-11 NOTE — Telephone Encounter (Signed)
Pt scheduled to see PA on 11/17/13.  I will have Dr Burt Knack review the pt's chart and determine if the pt needs labs.

## 2013-11-11 NOTE — Telephone Encounter (Signed)
New Message:  Mark French is requesting a call back to know if Mark French needs blood work drawn prior to his upcoming appt. She is requesting a call back from Dr. Antionette Char nurse.

## 2013-11-12 MED ORDER — POTASSIUM CHLORIDE CRYS ER 20 MEQ PO TBCR: 20.0000 meq | EXTENDED_RELEASE_TABLET | Freq: Two times a day (BID) | ORAL | Status: DC

## 2013-11-12 NOTE — Telephone Encounter (Signed)
Returned call to [place] with Hospice.She stated she will try and draw a bmet this Friday 11/14/13 prior to his visit with Estella Husk PA 11/17/13.Refill on Kdur will be sent to pharmacy.Stated she wanted Dr.Cooper to know patient has increase swelling in lower legs rt calf worse.Stated he has venous statis sores on lower legs.Stated patient is taking lasix 80 mg twice a day.Stated Hospice Dr.is out this week and she would like Dr.Cooper's advice on how to manage swelling in lower legs.Message sent to Chelyan for advice.

## 2013-11-12 NOTE — Telephone Encounter (Signed)
New Message:  Abigail Butts, hospice nurse, is calling for 4 things...   States the pt is up 6 pounds from last week in his wt, leg edema is worse.[name] Has questions about diuretic orders?  pt needs a K+ refill.   Also wants to know when the pt should have labs?   Lastly, does Dr. Burt Knack want to keep handling the pt's orders or would he rather a hospice doctor take over.

## 2013-11-12 NOTE — Telephone Encounter (Signed)
Could draw a BMET at the time of his visit. Hospice is involved in his care and would be fine to have them draw a BMET prior to the visit if that is feasible. thx

## 2013-11-13 ENCOUNTER — Encounter: Payer: Self-pay | Admitting: Endocrinology

## 2013-11-13 NOTE — Telephone Encounter (Signed)
Swelling is a chronic and longstanding problem. He is unable to take more diuretics and he is intolerant to metolazone. Would continue with leg elevation and wound care as tolerated. thx

## 2013-11-13 NOTE — Telephone Encounter (Signed)
Returned call to Robinette with Hospice Dr.Cooper advised continue leg elevation and wound care.Advised patient is unable to take more diuretics and is intolerant to metolazone.

## 2013-11-14 ENCOUNTER — Encounter: Payer: Self-pay | Admitting: Cardiovascular Disease

## 2013-11-17 ENCOUNTER — Encounter: Payer: Self-pay | Admitting: Physician Assistant

## 2013-11-17 ENCOUNTER — Ambulatory Visit (INDEPENDENT_AMBULATORY_CARE_PROVIDER_SITE_OTHER): Admitting: Physician Assistant

## 2013-11-17 VITALS — BP 119/46 | HR 64 | Ht 61.0 in | Wt 231.4 lb

## 2013-11-17 DIAGNOSIS — N259 Disorder resulting from impaired renal tubular function, unspecified: Secondary | ICD-10-CM

## 2013-11-17 DIAGNOSIS — I214 Non-ST elevation (NSTEMI) myocardial infarction: Secondary | ICD-10-CM

## 2013-11-17 DIAGNOSIS — I10 Essential (primary) hypertension: Secondary | ICD-10-CM

## 2013-11-17 DIAGNOSIS — I5043 Acute on chronic combined systolic (congestive) and diastolic (congestive) heart failure: Secondary | ICD-10-CM

## 2013-11-17 DIAGNOSIS — I5022 Chronic systolic (congestive) heart failure: Secondary | ICD-10-CM

## 2013-11-17 NOTE — Patient Instructions (Addendum)
Keep your scheduled appt with Dr. Burt Knack 01/15/14  At 10:00 am  Your physician recommends that you continue on your current medications as directed. Please refer to the Current Medication list given to you today.

## 2013-11-17 NOTE — Assessment & Plan Note (Signed)
Patient suffered another MI last month with repeat cardiac catheterizations showing severe diffuse native diabetic disease involving all vessels. The mid LAD is totally occluded, circumflex 100% occluded proximally, and RCA occluded distally. Bypass graft failure with occlusion of the SVG to the RCA and occlusion of the SVG to the circumflex. SVG to diagonal contains moderate disease, patent overlapping distal stents, and and on a small diagonal with relatively poor runoff. Patent LIMA to the LAD, LAD distal to the LIMA insertion is severely and diffusely diseased and diabetic pattern.  No further chest pain, continue medical therapy.

## 2013-11-17 NOTE — Assessment & Plan Note (Signed)
BUN 19 creatinine 1.10 on 11/14/13. Potassium stable at 4.4. Continue current therapy.

## 2013-11-17 NOTE — Assessment & Plan Note (Signed)
Patient's heart failure is currently compensated on Lasix 80 mg twice a day. Creatinine remains stable. Continue 2 g sodium diet. When himself daily. Hospice following.

## 2013-11-17 NOTE — Progress Notes (Signed)
HPI: Mark French is a 68 y.o. male with a history of obesity, chronic systolic HF with EF of [id number], CVA, PVD, CKD, DM, complete heart block s/p pacemaker placement and CAD who was re-admitted on 08/18/13 for weakness and hypotension. He was hospitalized 01/01-01/01/2014 with SOB and chest pain. He had a cardiac catheterization on that admission which showed severe native 3 vessel disease with LIMA-LAD and SVG-Diag patent, CFX occluded and RCA with severe diffuse proximal and distal disease. The SVG-OM and SVG-RCA are known to be occluded. Medical therapy was recommended. He was also found to be in complete heart block and he had a [employer] BiV PPM placed, on 01/02. His EF was 40% by echo at this time. The patient required diuresis and his d/c weight was 103.15 kg.   The patient was readmitted to the hospital on 10/25/13 with hypoxia initially requiring BiPAP.He had a NSTEMI with peak troponin went up to 5. and repeat cardiac catheterization was performed. This showed diffuse triple vessel disease. See below for details. 2-D echo showed ejection fraction remained at 40%. After discussion with the family and patient, palliative medicine was recommended. He is now being followed by hospice.    He does have chronic leg swelling and we've had several calls concerning this as well as weight gain. He's intolerant to metolazone and has chronic kidney disease which limits our ability to treat this. BNP 894 on 10/30/13, BUN/Crt 36/1.29 on 10/29/13.hospice labs on 11/14/13 in view in was 19 creatinine 1.10 potassium 4.4. Discharge weight 221 lbs.  The patient is doing much better since he's been home. His neuropathy pain is better controlled. He has not had any worsening shortness of breath or edema. Hospice is managing his medicines and oxygen. They see him twice a week. He has an electronic scale at home and weighed 225 today. His wife states that he is eating a little bit more than he had been in the hospital. Our scales  is 231 but he is fully dressed with a heavy oxygen tank and was unsteady on our scale. I believe this is an accurate. He denies any further chest pain, palpitations, dyspnea, dizziness, or presyncope.   Allergies: -- Sildenafil -- Nausea Only and Other (See                           Comments)   --  "took it once; heart went to pieces; never took it            again"  -- Percocet [Oxycodone-Acetaminophen] -- Other (See Comments)   --  Unknown reaction-possible altered mental state-per            wife.  -- Contrast Media [Iodinated Diagnostic Agents] -- Nausea Only  Current Outpatient Prescriptions on File Prior to Visit: albuterol (PROVENTIL HFA;VENTOLIN HFA) 108 (90 BASE) MCG/ACT inhaler, Inhale 2 puffs into the lungs every 6 (six) hours as needed for wheezing or shortness of breath., Disp: 1 Inhaler, Rfl: 11 albuterol (PROVENTIL) (2.5 MG/3ML) 0.083% nebulizer solution, Take 2.5 mg by nebulization 2 (two) times daily as needed for wheezing or shortness of breath. , Disp: , Rfl:  aspirin EC 81 MG tablet, Take 81 mg by mouth at bedtime., Disp: , Rfl:  atorvastatin (LIPITOR) 40 MG tablet, TAKE 1 TABLET BY MOUTH EVERY DAY, Disp: 30 tablet, Rfl: 0 budesonide (PULMICORT) 0.25 MG/2ML nebulizer solution, Take 0.25 mg by nebulization daily as needed (shortness of breath). , Disp: , Rfl:  calcitRIOL (ROCALTROL) 0.25 MCG capsule, Take 1 capsule (0.25 mcg total) by mouth daily., Disp: 30 capsule, Rfl: 11 carvedilol (COREG) 3.125 MG tablet, Take 1 tablet (3.125 mg total) by mouth 2 (two) times daily with a meal., Disp: 60 tablet, Rfl: 5 clopidogrel (PLAVIX) 75 MG tablet, Take 75 mg by mouth daily with breakfast., Disp: , Rfl:  co-enzyme Q-10 30 MG capsule, Take 30 mg by mouth daily at 2 PM daily at 2 PM. , Disp: , Rfl:  docusate sodium (COLACE) 100 MG capsule, Take 100 mg by mouth 2 (two) times daily as needed for constipation., Disp: , Rfl:  furosemide (LASIX) 40 MG tablet, Take 2 tablets (80 mg total) by  mouth 2 (two) times daily., Disp: 60 tablet, Rfl: 3 gabapentin (NEURONTIN) 400 MG capsule, Take 2 capsules (800 mg total) by mouth 3 (three) times daily., Disp: 180 capsule, Rfl: 3 HYDROcodone-acetaminophen (NORCO) 10-325 MG per tablet, Take 1-2 tablets by mouth every 4 (four) hours as needed for moderate pain., Disp: 150 tablet, Rfl: 0 insulin NPH Human (HUMULIN N,NOVOLIN N) 100 UNIT/ML injection, Inject 35 Units into the skin daily before breakfast., Disp: 2 vial, Rfl: 2 ipratropium (ATROVENT) 0.02 % nebulizer solution, Take 500 mcg by nebulization every 6 (six) hours as needed for wheezing or shortness of breath. , Disp: , Rfl:  isosorbide mononitrate (IMDUR) 30 MG 24 hr tablet, TAKE 1 TABLET BY MOUTH EVERY DAY, Disp: 30 tablet, Rfl: 3 losartan (COZAAR) 50 MG tablet, Take 0.5 tablets (25 mg total) by mouth daily., Disp: 30 tablet, Rfl: 5 Morphine Sulfate (MORPHINE CONCENTRATE) 10 mg / 0.5 ml concentrated solution, Place 0.25-0.5 mLs (5-10 mg total) under the tongue every 2 (two) hours as needed for moderate pain, severe pain, anxiety or shortness of breath., Disp: 15 mL, Rfl: 0 nitroGLYCERIN (NITROSTAT) 0.4 MG SL tablet, Place 1 tablet (0.4 mg total) under the tongue every 5 (five) minutes as needed for chest pain., Disp: 25 tablet, Rfl: 5 OVER THE COUNTER MEDICATION, Apply 1 application topically daily as needed. Anti fungal cream, Disp: , Rfl:  OVER THE COUNTER MEDICATION, Place 1 drop into both eyes daily as needed (dry eyes). [facility] eye drops, Disp: , Rfl:  pantoprazole (PROTONIX) 40 MG tablet, TAKE 1 TABLET BY MOUTH EVERY DAY, Disp: 30 tablet, Rfl: 0 potassium chloride SA (K-DUR,KLOR-CON) 20 MEQ tablet, Take 1 tablet (20 mEq total) by mouth 2 (two) times daily., Disp: 60 tablet, Rfl: 6 silodosin (RAPAFLO) 8 MG CAPS capsule, Take 8 mg by mouth at bedtime. , Disp: , Rfl:   No current facility-administered medications on file prior to visit.   Past Medical History:   CORONARY ARTERY  DISEASE                                        Comment:a. s/p CABG 1998. b.  08/2011: inferior STEMI.                LHC 09/07/11: Severe 3v CAD, LIMA-LAD patent,               SVG-RCA chronically occluded, SVG-circumflex               chronically occluded, SVG-diagonal 95%. PCI:               Promus DES to the SVG-diagonal.  Procedure c/b  transient CHB req CPR and TTVP. c. Myoview               11/2011: scar but no ischemia.    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)                   HYPERCHOLESTEROLEMIA                                         CERVICAL RADICULOPATHY, LEFT                                 PERIPHERAL NEUROPATHY                                        Chronic systolic heart failure                                 Comment:Echocardiogram 08/09/13:Cavity size normal, EF               40%, inferior hypokinesis, grade 1 diastolic               dysfunction, paradoxical septal motion, mild               MR, mild LAE, no PFO, PAS 51, moderate pulm HTN   ED (erectile dysfunction)                                    Hypogonadism male                                            GI bleed                                        2012           Comment:Multiple Duodenal ulcer    Obesity                                                      Legally blind  Comment:bilaterally   Stroke                                          1945           Comment:age 21 months; residual right sided weakness   History of blood transfusion                    2012         Charcot foot due to diabetes mellitus                          Comment:chronic pain   Osteoarthritis                                                 Comment:right hip and shoulder   COPD (chronic obstructive pulmonary disease)                 PERIPHERAL VASCULAR  INSUFFICIENCY,  LEGS, BILA* 08/17/2010    Hiatal hernia                                                Chronic stasis dermatitis                                      Comment:Chronic stasis changes.   CKD (chronic kidney disease), stage III                      Syncope                                                        Comment:a. 07/2013: suspected due to hypotension.   Biventricular cardiac pacemaker in situ                        Comment:a. [employer]rey RF model               [id number] (serial number [id number])   Complete heart block                                           Comment:a. s/p pacemaker placement on 08/2013 admission  Past Surgical History:   CARPAL TUNNEL RELEASE                                           Comment:left   SIGMOIDOSCOPY  03/11/2001   Venous Doppler                                   01/30/2004   TONSILLECTOMY AND ADENOIDECTOMY                                 Comment:"as a kid"   CATARACT EXTRACTION W/ INTRAOCULAR LENS  IMPLA*               CORONARY ANGIOPLASTY WITH STENT PLACEMENT        08/2011         Comment:"1"   RETINOPATHY SURGERY                              2000's         Comment:"laser; both eyes"   WRIST FUSION                                     1976           Comment:right   ANTERIOR FUSION CERVICAL SPINE                   2010           Comment:"C spine; Dr. Ronnald Ramp"   [place]; ?da*     Comment:LEFT:  fused; removed bulk of hardware   HEEL SPUR SURGERY                                ~ 2007         Comment:? left   IRRIGATION AND DEBRIDEMENT KNEE                  02/27/2012      Comment:Procedure: IRRIGATION AND DEBRIDEMENT KNEE;                Surgeon: Augustin Schooling, MD;  Location: [facility];              Service: Orthopedics;  Laterality: Right;                irrigation and drainage right knee septic               bursitis   EYE SURGERY                                       2008           Comment:cataract removal, cautery   CORONARY ARTERY BYPASS GRAFT                     1998           Comment:CABG X5   FRACTURE SURGERY  Comment:foot broken Wallace                              08/08/2013     Comment:Biventricular pacemaker: [employer] RF model [id number] (serial number [id number])  Review of patient's family history indicates:   Heart disease                  Mother                     Comment: Coronary Artery Disease   Heart disease                  Father                     Comment: Coronary Artery Disease   Heart disease                  Paternal Grandmother       Comment: Coronary Artery Disease   Colon cancer                   Neg Hx                   Diabetes                       Other                      Comment: Grandmother   Social History   Marital Status: Married             Spouse Name:                      Years of Education:                 Number of children: 0           Occupational History Occupation          Fish farm manager            Comment              Disabled                                  Social History Main Topics   Smoking Status: Never Smoker                     Smokeless Status: Never Used                       Alcohol Use: No             Drug Use: No             Sexual Activity: No                 Other Topics            Concern   None on file  Social History Narrative   Comes to appointments with wife    0 Caffeine drinks daily     ROS: See history of present illness otherwise negative   PHYSICAL EXAM: Obese in a motorized wheelchair,  in no acute distress. Neck: No JVD, HJR, Bruit, or thyroid enlargement  Lungs: Decreased breath sounds throughout but No tachypnea, clear without wheezing, rales, or rhonchi  Cardiovascular: RRR, PMI not displaced, heart sounds distant, no murmurs, gallops,  bruit, thrill, or heave.  Abdomen: BS normal. Soft without organomegaly, masses, lesions or tenderness.  Extremities: +1-2 brawny edema bilaterally up to his knees, chronic otherwise lower extremities without cyanosis, clubbing. Decreased distal pulses bilateral  SKin: Warm, no lesions or rashes   Musculoskeletal: No deformities  Neuro: no focal signs  BP 119/46  Pulse 64  Ht 5\' 1"  (1.549 m)  Wt 231 lb 6.4 oz (104.962 kg)  BMI 43.75 kg/m2    EKG: Atrial sensed ventricular paced with IVCD prior inferior MI, no acute change  Procedures: Cardiac cath 3/23--Severe diffuse native diabetic disease involving all vessels. The mid LAD is totally occluded, circumflex 100% occluded proximally, and RCA occluded distally.  2. 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 The saphenous vein graft to the diagonal contains moderate disease, patent overlapping distal stents, and ends on a small diagonal with relatively poor runoff.   3. Left ventricular systolic dysfunction with evidence of heart failure denoted by significant LVEDP elevation of 33 mmHg   4. Patent LIMA to LAD. The LAD distal to the LIMA insertion is severely and diffusely diseased " diabetic pattern"  2-D echo 10/27/13 Study Conclusions  - Left ventricle: Poor image quality Contrast helped a bit.   Inferior wall appears severely hypokinetic The cavity size   was moderately dilated. The estimated ejection fraction   was 40%. - Mitral valve: Calcified annulus. Mildly thickened leaflets   . Mild regurgitation. - Left atrium: The atrium was moderately dilated. - Atrial septum: No defect or patent foramen ovale was   identified. - Pulmonary arteries: PA peak pressure: 68mm Hg (S).

## 2013-11-17 NOTE — Assessment & Plan Note (Signed)
Blood pressure stable ? ?

## 2013-11-18 ENCOUNTER — Encounter: Payer: Self-pay | Admitting: Endocrinology

## 2013-11-19 ENCOUNTER — Encounter: Payer: Self-pay | Admitting: Endocrinology

## 2013-11-20 ENCOUNTER — Other Ambulatory Visit: Payer: Self-pay | Admitting: Endocrinology

## 2013-11-20 ENCOUNTER — Telehealth: Payer: Self-pay | Admitting: Cardiovascular Disease

## 2013-11-20 NOTE — Telephone Encounter (Signed)
New message      Did you ordered a prn lasix order?  The patient said you did.  Please call order in to hospice

## 2013-11-20 NOTE — Telephone Encounter (Signed)
Mark French at hospice called to verify if Mark French needs to take an extra Lasix as needed as Mark French claims Dr. Burt Knack recommended. Mark French is aware that Mark French was seen by Ermalinda Barrios PA and in her note she doe not recommends extra PRN lasix dose. Mark French is to continue taking 80 mg twice a day. Mark French is to have a 2 gm NA diet.  Mark French verbalized understanding.

## 2013-11-24 ENCOUNTER — Other Ambulatory Visit: Payer: Self-pay | Admitting: Endocrinology

## 2013-12-01 ENCOUNTER — Other Ambulatory Visit: Payer: Self-pay | Admitting: Cardiovascular Disease

## 2013-12-10 ENCOUNTER — Telehealth: Payer: Self-pay | Admitting: Endocrinology

## 2013-12-10 MED ORDER — SILODOSIN 8 MG PO CAPS: 8.0000 mg | ORAL_CAPSULE | Freq: Every day | ORAL | Status: DC

## 2013-12-10 NOTE — Telephone Encounter (Signed)
Patient's wife informed

## 2013-12-10 NOTE — Telephone Encounter (Signed)
See below and please advise.  Medication has not been filled here before. Thanks!

## 2013-12-10 NOTE — Telephone Encounter (Signed)
Ok, i have sent a prescription to your pharmacy  

## 2013-12-10 NOTE — Telephone Encounter (Signed)
Patients wife called stating he needs refill on Rapaflo  Is this something Dr. Loanne Drilling can fill    [phone]  Thank you :)

## 2013-12-15 ENCOUNTER — Other Ambulatory Visit: Payer: Self-pay | Admitting: Endocrinology

## 2013-12-16 ENCOUNTER — Telehealth: Payer: Self-pay | Admitting: Physician Assistant

## 2013-12-16 NOTE — Telephone Encounter (Signed)
Mandy with [facility] called after-hours regarding patient symptoms. This afternoon he had increase in LEE and SOB. He has a blister, nonweeping, on left leg. He feels bloated in his abdomen. Taking Lasix 40mg  2 tabs BID. Compliant with meds, salt restriction, fluid intake. Cannot elevate legs well due to neuropathy. Diuretics have been limited by renal insufficiency in the past and he has been unable to take metolazone. He took the morphine sulfate he has at home earlier with some improvement in dyspnea (is written PRN for SOB or pain). Weight is up 2.8 lbs. BP 143/75 then recheck later 120/50, 99% 2L. UOP slightly decreased. Does not want to come to hospital. Advised he take an extra Lasix (40mg ) this evening and see if this improves sx. If it does not he should call back. Hospice will check with him tomorrow to see how he is feeling and he will update [name] as well. Obviously if he becomes in acute distress he can seek more urgent medical attention. Mandy verbalized understanding of plan and will relay instructions to patient. Tristen Luce PA-C

## 2013-12-18 ENCOUNTER — Telehealth: Payer: Self-pay | Admitting: Cardiovascular Disease

## 2013-12-18 NOTE — Telephone Encounter (Signed)
New message    1.  Do you want to do a prn order for lasix or call oncall doc for emergency.  This will be for at night or for emergencies 2.  Hospice physician want to know what is the story about netolazone-----pt and wife said pt cannot use it but do not know why 3.  yesterday wife put a una boot on husband because of blisters and leg was swollen.  Can hospice have an order for this? 4. Because of chronic pain level---pt is on methodone 5mg  bid---want to let you know

## 2013-12-19 NOTE — Telephone Encounter (Signed)
1. YES. Lasix can be used prn severe shortness of breath (80 mg PO or 80 mg IV)  2. Pt became severely weak after 1 dose of metolazone. We have not tried it since that reaction.  3. YES. Please order [employer].  4. OK. thx  Sherren Mocha 12/19/2013 2:33 PM

## 2013-12-19 NOTE — Telephone Encounter (Signed)
I spoke with Mark French and made her aware of Dr Antionette Char responses.

## 2013-12-22 ENCOUNTER — Other Ambulatory Visit: Payer: Self-pay | Admitting: Endocrinology

## 2013-12-31 ENCOUNTER — Other Ambulatory Visit: Payer: Self-pay | Admitting: Internal Medicine

## 2014-01-03 ENCOUNTER — Other Ambulatory Visit: Payer: Self-pay | Admitting: Cardiovascular Disease

## 2014-01-12 ENCOUNTER — Other Ambulatory Visit (INDEPENDENT_AMBULATORY_CARE_PROVIDER_SITE_OTHER)

## 2014-01-12 DIAGNOSIS — I5022 Chronic systolic (congestive) heart failure: Secondary | ICD-10-CM

## 2014-01-12 DIAGNOSIS — I25810 Atherosclerosis of coronary artery bypass graft(s) without angina pectoris: Secondary | ICD-10-CM

## 2014-01-12 DIAGNOSIS — R0602 Shortness of breath: Secondary | ICD-10-CM

## 2014-01-12 LAB — BASIC METABOLIC PANEL
BUN: 18 mg/dL (ref 6–23)
CALCIUM: 8.9 mg/dL (ref 8.4–10.5)
CO2: 30 mEq/L (ref 19–32)
Chloride: 97 mEq/L (ref 96–112)
Creatinine, Ser: 1.4 mg/dL (ref 0.4–1.5)
GFR: 54.09 mL/min — ABNORMAL LOW (ref >60.00)
GLUCOSE: 175 mg/dL — AB (ref 70–99)
Potassium: 4.3 mEq/L (ref 3.5–5.1)
SODIUM: 134 meq/L — AB (ref 135–145)

## 2014-01-15 ENCOUNTER — Encounter: Payer: Self-pay | Admitting: Cardiovascular Disease

## 2014-01-15 ENCOUNTER — Ambulatory Visit (INDEPENDENT_AMBULATORY_CARE_PROVIDER_SITE_OTHER): Admitting: Cardiovascular Disease

## 2014-01-15 VITALS — BP 118/60 | HR 65 | Ht 61.0 in | Wt 212.0 lb

## 2014-01-15 DIAGNOSIS — I25810 Atherosclerosis of coronary artery bypass graft(s) without angina pectoris: Secondary | ICD-10-CM

## 2014-01-15 DIAGNOSIS — E7800 Pure hypercholesterolemia, unspecified: Secondary | ICD-10-CM

## 2014-01-15 DIAGNOSIS — I5043 Acute on chronic combined systolic (congestive) and diastolic (congestive) heart failure: Secondary | ICD-10-CM

## 2014-01-15 NOTE — Progress Notes (Signed)
HPI:  Mark French is a 68 y.o. male with a history of obesity, chronic systolic HF with EF of 47%, CVA, PVD, CKD, DM, complete heart block s/p pacemaker placement and CAD who was re-admitted on 08/18/13 for weakness and hypotension. He was hospitalized 01/01-01/01/2014 with SOB and chest pain. He had a cardiac catheterization on that admission which showed severe native 3 vessel disease with LIMA-LAD and SVG-Diag patent, CFX occluded and RCA with severe diffuse proximal and distal disease. The SVG-OM and SVG-RCA are known to be occluded. Medical therapy was recommended. He was also found to be in complete heart block and he had a [employer] Jude BiV PPM placed, on 01/02. His EF was 40% by echo at this time.   The patient was readmitted to the hospital on 10/25/13 with hypoxia initially requiring BiPAP.He had a NSTEMI with peak troponin went up to 5. and repeat cardiac catheterization was performed. This showed diffuse triple vessel disease.  2-D echo showed ejection fraction remained at 40%. After discussion with the family and patient, palliative medicine was recommended. He is now being followed by hospice.   He and his wife have been very satisfied with hospice care. He's had a few episodes of shortness of breath that have been managed with anxiolytics and narcotics. His pain is under much better controlled and has been in the past. He's had no chest pain. Leg swelling is stable. No lightheadedness or syncope. No orthopnea or PND.   Outpatient Encounter Prescriptions as of 01/15/2014  Medication Sig  . aspirin EC 81 MG tablet Take 81 mg by mouth at bedtime.  [name] atorvastatin (LIPITOR) 40 MG tablet TAKE 1 TABLET BY MOUTH EVERY DAY  . calcitRIOL (ROCALTROL) 0.25 MCG capsule Take 1 capsule (0.25 mcg total) by mouth daily.  . carvedilol (COREG) 3.125 MG tablet Take 1 tablet (3.125 mg total) by mouth 2 (two) times daily with a meal.  . clopidogrel (PLAVIX) 75 MG tablet Take 75 mg by mouth daily with  breakfast.  . co-enzyme Q-10 30 MG capsule Take 30 mg by mouth daily at 2 PM daily at 2 PM.   . docusate sodium (COLACE) 100 MG capsule Take 100 mg by mouth 2 (two) times daily as needed for constipation.  . furosemide (LASIX) 40 MG tablet TAKE 2 TABLETS BY MOUTH TWICE DAILY  . gabapentin (NEURONTIN) 400 MG capsule Take 2 capsules (800 mg total) by mouth 3 (three) times daily.  [name] HYDROcodone-acetaminophen (NORCO) 10-325 MG per tablet Take 1-2 tablets by mouth every 4 (four) hours as needed for moderate pain.  [name] insulin NPH Human (HUMULIN N,NOVOLIN N) 100 UNIT/ML injection Inject 35 Units into the skin daily before breakfast.  . Insulin Syringe-Needle U-100 (INSULIN SYRINGE 1CC/31GX5/16") 31G X 5/16" 1 ML MISC USE AS DIRECTED TO INJECT INSULIN FOUR TIMES DAILY  . ipratropium (ATROVENT) 0.02 % nebulizer solution Take 500 mcg by nebulization every 6 (six) hours as needed for wheezing or shortness of breath.   . isosorbide mononitrate (IMDUR) 30 MG 24 hr tablet TAKE 1 TABLET BY MOUTH EVERY DAY  . losartan (COZAAR) 50 MG tablet Take 0.5 tablets (25 mg total) by mouth daily.  . Morphine Sulfate (MORPHINE CONCENTRATE) 10 mg / 0.5 ml concentrated solution Place 0.25-0.5 mLs (5-10 mg total) under the tongue every 2 (two) hours as needed for moderate pain, severe pain, anxiety or shortness of breath.  . nitroGLYCERIN (NITROSTAT) 0.4 MG SL tablet Place 1 tablet (0.4 mg total) under the tongue  every 5 (five) minutes as needed for chest pain.  [name] OVER THE COUNTER MEDICATION Apply 1 application topically daily as needed. Anti fungal cream  . OVER THE COUNTER MEDICATION Place 1 drop into both eyes daily as needed (dry eyes). [facility] eye drops  . pantoprazole (PROTONIX) 40 MG tablet TAKE 1 TABLET BY MOUTH EVERY DAY  . potassium chloride SA (K-DUR,KLOR-CON) 20 MEQ tablet Take 1 tablet (20 mEq total) by mouth 2 (two) times daily.  . silodosin (RAPAFLO) 8 MG CAPS capsule Take 1 capsule (8 mg total) by mouth at  bedtime.  . [DISCONTINUED] albuterol (PROVENTIL HFA;VENTOLIN HFA) 108 (90 BASE) MCG/ACT inhaler Inhale 2 puffs into the lungs every 6 (six) hours as needed for wheezing or shortness of breath.  . [DISCONTINUED] albuterol (PROVENTIL) (2.5 MG/3ML) 0.083% nebulizer solution Take 2.5 mg by nebulization 2 (two) times daily as needed for wheezing or shortness of breath.   . [DISCONTINUED] budesonide (PULMICORT) 0.25 MG/2ML nebulizer solution Take 0.25 mg by nebulization daily as needed (shortness of breath).   . [DISCONTINUED] HUMULIN N 100 UNIT/ML injection INJECT 70 UNITS INTO THE SKIN EVERY DAY BEFORE BREAKFAST    Allergies  Allergen Reactions  . Sildenafil Nausea Only and Other (See Comments)    "took it once; heart went to pieces; never took it again"  . Percocet [Oxycodone-Acetaminophen] Other (See Comments)    Unknown reaction-possible altered mental state-per wife.    . Contrast Media [Iodinated Diagnostic Agents] Nausea Only    Past Medical History  Diagnosis Date  . CORONARY ARTERY DISEASE     a. s/p CABG 1998. b.  08/2011: inferior STEMI.  LHC 09/07/11: Severe 3v CAD, LIMA-LAD patent, SVG-RCA chronically occluded, SVG-circumflex chronically occluded, SVG-diagonal 95%. PCI: Promus DES to the SVG-diagonal.  Procedure c/b transient CHB req CPR and TTVP. c. Myoview 11/2011: scar but no ischemia.   [name] DIABETES MELLITUS, TYPE I   . HYPERTENSION   . 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 HYPERCHOLESTEROLEMIA   . CERVICAL RADICULOPATHY, LEFT   . PERIPHERAL NEUROPATHY   . Chronic systolic heart failure     Echocardiogram 08/09/13:Cavity size normal, EF 40%, inferior hypokinesis, grade 1 diastolic dysfunction, paradoxical septal motion, mild MR, mild LAE, no PFO, PAS 51, moderate pulm HTN  . ED (erectile dysfunction)   . Hypogonadism male   . GI bleed 2012    Multiple Duodenal ulcer   . Obesity   . Legally blind     bilaterally  . Stroke 74    age 63 months; residual right sided  weakness  . History of blood transfusion 2012  . Charcot foot due to diabetes mellitus     chronic pain  . Osteoarthritis     right hip and shoulder  . COPD (chronic obstructive pulmonary disease)   . 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 Hiatal hernia   . Chronic stasis dermatitis     Chronic stasis changes.  . CKD (chronic kidney disease), stage III   . Syncope     a. 07/2013: suspected due to hypotension.  . Biventricular cardiac pacemaker in situ     a. [address] Jude [employer] RF model [id number] (serial number [id number])  . Complete heart block     a. s/p pacemaker placement on 08/2013 admission    ROS: Negative except as per HPI  BP 118/60  Pulse 65  Ht 5\' 1"  (1.549 m)  Wt 96.163 kg (212 lb)  BMI 40.08 kg/m2  SpO2 98%  PHYSICAL EXAM: Pt is alert and oriented, chronically ill-appearing male in a motorized scooter, pleasant, in NAD HEENT: normal Neck: JVP - normal, carotids 2+= without bruits Lungs: CTA bilaterally except for mild inspiratory rales in the left base CV: RRR without murmur or gallop Abd: soft, NT, Positive BS, no hepatomegaly Ext: no C/C/E, distal pulses intact and equal Skin: warm/dry no rash  EKG:  AV sequential pacing (biventricular) heart rate 65 beats per minute  ASSESSMENT AND PLAN: 1. Coronary artery disease, native vessel and bypass graft disease. Patient with extensive disease, stable findings at recent cath. Nothing to offer from a percutaneous standpoint. Continue with medical therapy. Fortunately the patient is having no anginal symptoms.  2. Chronic systolic heart failure, [place] Heart Association class 3-4 symptoms. He has gained a lot of benefit from having hospice in the home. Continue current management. A sliding scale for his furosemide was written. She has a 3 pound weight gain in 24 hours her 5 pound weight gain from baseline, he will increase furosemide to 120 mg twice daily until his weight returns to  baseline. Followup metabolic panel in 3 months. Followup office visit in 3 months.  3. Hypertension. Blood pressure is well controlled.  Overall I think the patient is doing quite well. He and his wife are comfortable with a palliative approach and hospice has been very good for them. I will see him back in 3 months for followup.  Sherren Mocha 01/15/2014 10:30 AM

## 2014-01-15 NOTE — Patient Instructions (Signed)
Your physician recommends that you continue on your current medications as directed. Please refer to the Current Medication list given to you today.  You can use Maalox as needed for abdominal pain--please follow instructions for dosing on the bottle  Continue Furosemide 80mg  twice a day.  If your weight increases by 3 pounds in a 24 hour period or is greater than 5 pounds from your baseline weight, please increase Furosemide to 120mg  twice a day until your weight returns to baseline.   Your physician recommends that you schedule a follow-up appointment in: 3 MONTHS with Dr Burt Knack  Your physician recommends that you have a BMP checked every 3 MONTHS by Hospice.

## 2014-01-17 ENCOUNTER — Encounter: Payer: Self-pay | Admitting: Cardiovascular Disease

## 2014-01-25 ENCOUNTER — Other Ambulatory Visit: Payer: Self-pay | Admitting: Cardiovascular Disease

## 2014-02-09 ENCOUNTER — Encounter: Payer: Self-pay | Admitting: Endocrinology

## 2014-02-09 ENCOUNTER — Ambulatory Visit (INDEPENDENT_AMBULATORY_CARE_PROVIDER_SITE_OTHER): Admitting: Endocrinology

## 2014-02-09 VITALS — BP 118/70 | HR 60 | Temp 98.7°F

## 2014-02-09 DIAGNOSIS — G894 Chronic pain syndrome: Secondary | ICD-10-CM

## 2014-02-09 DIAGNOSIS — E1049 Type 1 diabetes mellitus with other diabetic neurological complication: Secondary | ICD-10-CM

## 2014-02-09 LAB — HEMOGLOBIN A1C: Hgb A1c MFr Bld: 6.7 % — ABNORMAL HIGH (ref 4.6–6.5)

## 2014-02-09 NOTE — Patient Instructions (Addendum)
Please come back for a follow-up appointment in 3 months.   check your blood sugar twice a day.  vary the time of day when you check, between before the 3 meals, and at bedtime.  also check if you have symptoms of your blood sugar being too high or too low.  please keep a record of the readings and bring it to your next appointment here.  You can write it on any piece of paper.  please call us sooner if your blood sugar goes below 70, or if you have a lot of readings over 200.  On this type of insulin schedule, you should eat meals on a regular schedule.  If a meal is missed or significantly delayed, your blood sugar could go low.   A diabetes blood test is requested for you today.  We'll contact you with results.
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
## 2014-02-11 ENCOUNTER — Other Ambulatory Visit: Payer: Self-pay | Admitting: Cardiovascular Disease

## 2014-02-13 ENCOUNTER — Telehealth: Payer: Self-pay | Admitting: Internal Medicine

## 2014-02-13 NOTE — Telephone Encounter (Signed)
Spoke with patient and he is with Hospice now due to cardiac issues. His hospice nurse is with him now. He is calling to report aching across his stomach off and on. States this has been happening for a month but seems to be getting more frequent. Denies constipation. He just wants to schedule OV with MD. Scheduled on 02/24/14 9:30 AM.

## 2014-02-13 NOTE — Telephone Encounter (Signed)
Please try Levsin SL.125 mg, #30, 1 SL q 4-6 hours prn abd. Pain, no refill

## 2014-02-16 ENCOUNTER — Encounter: Payer: Self-pay | Admitting: *Deleted

## 2014-02-16 MED ORDER — HYOSCYAMINE SULFATE 0.125 MG SL SUBL: 0.1250 mg | SUBLINGUAL_TABLET | SUBLINGUAL | Status: DC | PRN

## 2014-02-16 NOTE — Telephone Encounter (Signed)
Wife notified They will call back for any additional questions or concerns

## 2014-02-18 ENCOUNTER — Other Ambulatory Visit: Payer: Self-pay | Admitting: Endocrinology

## 2014-02-24 ENCOUNTER — Ambulatory Visit (INDEPENDENT_AMBULATORY_CARE_PROVIDER_SITE_OTHER): Admitting: Internal Medicine

## 2014-02-24 ENCOUNTER — Encounter: Payer: Self-pay | Admitting: Internal Medicine

## 2014-02-24 ENCOUNTER — Ambulatory Visit: Payer: Medicare Other | Admitting: Internal Medicine

## 2014-02-24 ENCOUNTER — Ambulatory Visit (HOSPITAL_COMMUNITY)
Admission: RE | Admit: 2014-02-24 | Discharge: 2014-02-24 | Disposition: A | Discharge status: Home / Self Care | Payer: Medicare Other | Source: Ambulatory Visit | Attending: Internal Medicine | Admitting: Internal Medicine

## 2014-02-24 VITALS — BP 104/56 | HR 68 | Ht 61.0 in | Wt 215.0 lb

## 2014-02-24 DIAGNOSIS — K2981 Duodenitis with bleeding: Secondary | ICD-10-CM

## 2014-02-24 DIAGNOSIS — R1013 Epigastric pain: Secondary | ICD-10-CM

## 2014-02-24 DIAGNOSIS — K269 Duodenal ulcer, unspecified as acute or chronic, without hemorrhage or perforation: Secondary | ICD-10-CM

## 2014-02-24 MED ORDER — SUCRALFATE 1 GM/10ML PO SUSP: 1.0000 g | Freq: Two times a day (BID) | ORAL | Status: DC

## 2014-02-24 MED ORDER — PANTOPRAZOLE SODIUM 40 MG PO TBEC: 40.0000 mg | DELAYED_RELEASE_TABLET | Freq: Two times a day (BID) | ORAL | Status: DC

## 2014-02-24 NOTE — Progress Notes (Signed)
Mark French 1946/03/23 [id number]  Note: This dictation was prepared with Dragon digital system. Any transcriptional errors that result from this procedure are unintentional.   History of Present Illness:  This is a 68 year old white male with end-stage heart disease who started hospice care recently. He is here today to evaluate epigastric pain. He has a history of bleeding duodenal ulcers in May 2012. He has been on Protonix 40 mg daily. Certain foods make the pain worse. He denies nausea, vomiting or melena. His most recent hemoglobin was 10.8, hematocrit of 34.6. A CT scan of the abdomen without IV contrast in October 2014 showed no acute process. He has coronary artery disease, chronic renal insufficiency and is status post coronary artery bypass graft in 1998. His ejection fraction is 40%. He has a permanent transvenous pacemaker for complete heart block. He ambulates in a wheelchair.    Past Medical History  Diagnosis Date  . CORONARY ARTERY DISEASE     a. s/p CABG 1998. b.  08/2011: inferior STEMI.  LHC 09/07/11: Severe 3v CAD, LIMA-LAD patent, SVG-RCA chronically occluded, SVG-circumflex chronically occluded, SVG-diagonal 95%. PCI: Promus DES to the SVG-diagonal.  Procedure c/b transient CHB req CPR and TTVP. c. Myoview 11/2011: scar but no ischemia.   [name] DIABETES MELLITUS, TYPE I   . HYPERTENSION   . 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 HYPERCHOLESTEROLEMIA   . CERVICAL RADICULOPATHY, LEFT   . PERIPHERAL NEUROPATHY   . Chronic systolic heart failure     Echocardiogram 08/09/13:Cavity size normal, EF 40%, inferior hypokinesis, grade 1 diastolic dysfunction, paradoxical septal motion, mild MR, mild LAE, no PFO, PAS 51, moderate pulm HTN  . ED (erectile dysfunction)   . Hypogonadism male   . GI bleed 2012    Multiple Duodenal ulcer   . Obesity   . Legally blind     bilaterally  . Stroke 78    age 52 months; residual right sided weakness  . History of blood transfusion  2012  . Charcot foot due to diabetes mellitus     chronic pain  . Osteoarthritis     right hip and shoulder  . COPD (chronic obstructive pulmonary disease)   . 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 Hiatal hernia   . Chronic stasis dermatitis     Chronic stasis changes.  . CKD (chronic kidney disease), stage III   . Syncope     a. 07/2013: suspected due to hypotension.  . Biventricular cardiac pacemaker in situ     a. [address] [employer] [place] RF model [id number] (serial number [id number])  . Complete heart block     a. s/p pacemaker placement on 08/2013 admission  . Duodenal ulcer     Past Surgical History  Procedure Laterality Date  . Carpal tunnel release Left   . Sigmoidoscopy  03/11/2001  . Venous doppler  01/30/2004  . Tonsillectomy and adenoidectomy      "as a kid"  . Cataract extraction w/ intraocular lens  implant, bilateral    . Coronary angioplasty with stent placement  08/2011    "1"  . Retinopathy surgery Bilateral 2000's    "laser; both eyes"  . Wrist fusion Right 1976  . Anterior fusion cervical spine  2010    "C spine; Dr. Ronnald Ramp"  . Ankle fracture surgery Left 1976; ?date    LEFT:  fused; removed bulk of hardware  . Heel spur surgery Left ~ 2007    ? left  . Irrigation and  debridement knee  02/27/2012    Procedure: IRRIGATION AND DEBRIDEMENT KNEE;  Surgeon: Augustin Schooling, MD;  Location: [facility];  Service: Orthopedics;  Laterality: Right;  irrigation and drainage right knee septic bursitis  . Eye surgery  2008    cataract removal, cautery  . Coronary artery bypass graft  1998    CABG X5  . Fracture surgery      foot broken 1976  . Pacemaker insertion  08/08/2013    Biventricular pacemaker: [place] RF model [id number] (serial number [id number])    Allergies  Allergen Reactions  . Sildenafil Nausea Only and Other (See Comments)    "took it once; heart went to pieces; never took it again"  . Percocet  [Oxycodone-Acetaminophen] Other (See Comments)    Unknown reaction-possible altered mental state-per wife.    . Contrast Media [Iodinated Diagnostic Agents] Nausea Only    Family history and social history have been reviewed.  Review of Systems: Denies dysphagia, positive for heartburn. intermittent abdominal pain as above  The remainder of the 10 point ROS is negative except as outlined in the H&P  Physical Exam: General Appearance Well developed, in no distress obese in a wheelchair unable to get up on the examining table,Has nasal oxygen 2 L  Eyes  Non icteric  HEENT  Non traumatic, normocephalic  Mouth No lesion, tongue papillated, no cheilosis Neck Supple without adenopathy, thyroid not enlarged, no carotid bruits, no JVD Lungs Clear to auscultation bilaterallyInspiratory rales  COR Normal S1, normal S2, regular rhythm, no murmur, quiet precordium Abdomen Morbidly obese soft minimally tender in epigastrium. Small umbilical hernia. Normal bowel sounds,?? Hepatomegaly, no ascites.  Rectal Unable to do  Extremities  No pedal edema Skin No lesions Neurological Alert and oriented x 3 Psychological Normal mood and affect  Assessment and Plan:   Problem #48 68 year old white male with end-stage heart disease. He has recurrent epigastric pain with a history of bleeding duodenal ulcers. We will increase his Protonix to 40 mg twice a day and add Carafate slurry 10 cc by mouth twice a day. He will also use Levsin sublingual 0.125 mg every 4 hours when necessary for abdominal pain. He is too high risk for upper endoscopy. We will proceed however with an upper abdominal ultrasound to rule out symptomatic gallbladder disease., also to assess for hepatomegaly    Delfin Edis 02/24/2014

## 2014-02-24 NOTE — Patient Instructions (Addendum)
We have sent the following medications to your pharmacy for you to pick up at your convenience: Carafate 10 ml twice daily Protonix 40 mg twice daily  Please start taking your Levsin SL every 4 hours as needed for abdominal pain.  You have been scheduled for an abdominal ultrasound at [facility] (1st floor of hospital) on TODAY at 11:30 am. Please arrive 15 minutes prior to your appointment for registration. Make certain not to have anything to eat or drink 6 hours prior to your appointment. Should you need to reschedule your appointment, please contact radiology at [phone]. This test typically takes about 30 minutes to perform.  CC:Dr [name], Dr Burt Knack

## 2014-03-04 ENCOUNTER — Ambulatory Visit (INDEPENDENT_AMBULATORY_CARE_PROVIDER_SITE_OTHER): Admitting: *Deleted

## 2014-03-04 DIAGNOSIS — I442 Atrioventricular block, complete: Secondary | ICD-10-CM

## 2014-03-04 DIAGNOSIS — I5043 Acute on chronic combined systolic (congestive) and diastolic (congestive) heart failure: Secondary | ICD-10-CM

## 2014-03-04 LAB — MDC_IDC_ENUM_SESS_TYPE_INCLINIC
Brady Statistic RA Percent Paced: 63 %
Brady Statistic RV Percent Paced: 99.32 %
Date Time Interrogation Session: 20150729095628
Lead Channel Impedance Value: 487.5 Ohm
Lead Channel Impedance Value: 887.5 Ohm
Lead Channel Pacing Threshold Amplitude: 0.75 V
Lead Channel Pacing Threshold Amplitude: 0.75 V
Lead Channel Pacing Threshold Amplitude: 1.25 V
Lead Channel Pacing Threshold Pulse Width: 0.5 ms
Lead Channel Pacing Threshold Pulse Width: 0.5 ms
Lead Channel Sensing Intrinsic Amplitude: 4.5 mV
Lead Channel Setting Pacing Amplitude: 2.5 V
Lead Channel Setting Pacing Pulse Width: 0.5 ms
MDC IDC MSMT BATTERY REMAINING LONGEVITY: 81.6 mo
MDC IDC MSMT BATTERY VOLTAGE: 2.96 V
MDC IDC MSMT LEADCHNL LV PACING THRESHOLD AMPLITUDE: 1.25 V
MDC IDC MSMT LEADCHNL LV PACING THRESHOLD PULSEWIDTH: 0.5 ms
MDC IDC MSMT LEADCHNL LV PACING THRESHOLD PULSEWIDTH: 0.5 ms
MDC IDC MSMT LEADCHNL RA PACING THRESHOLD AMPLITUDE: 0.75 V
MDC IDC MSMT LEADCHNL RA PACING THRESHOLD PULSEWIDTH: 0.5 ms
MDC IDC MSMT LEADCHNL RA PACING THRESHOLD PULSEWIDTH: 0.5 ms
MDC IDC MSMT LEADCHNL RV IMPEDANCE VALUE: 562.5 Ohm
MDC IDC MSMT LEADCHNL RV PACING THRESHOLD AMPLITUDE: 0.75 V
MDC IDC MSMT LEADCHNL RV SENSING INTR AMPL: 12 mV
MDC IDC PG MODEL: 3242
MDC IDC PG SERIAL: 7555472
MDC IDC SET LEADCHNL RA PACING AMPLITUDE: 2 V
MDC IDC SET LEADCHNL RV PACING AMPLITUDE: 2.5 V
MDC IDC SET LEADCHNL RV PACING PULSEWIDTH: 0.5 ms
MDC IDC SET LEADCHNL RV SENSING SENSITIVITY: 5 mV

## 2014-03-04 NOTE — Progress Notes (Signed)
CRT-P device check in clinic. Normal device function. Thresholds, sensing, impedance consistent with previous measurements. Histograms appropriate for patient and level of activity. No mode switches or ventricular high rate episodes. Patient bi-ventricularly pacing >100 % of the time. Device programmed with appropriate safety margins. Device heart failure diagnostics are within normal limits and stable over time. Estimated longevity 6.8 years. Patient enrolled in remote follow-up/TTM's with [employer]. Plan to check device remotely in 3 months and every 6 months in office.  Merlin 06/08/14.

## 2014-03-09 ENCOUNTER — Other Ambulatory Visit: Payer: Self-pay | Admitting: Cardiovascular Disease

## 2014-03-19 ENCOUNTER — Telehealth: Payer: Self-pay

## 2014-03-19 NOTE — Telephone Encounter (Signed)
Diabetic Bundle. Pt to call back and schedule appointment.

## 2014-03-20 ENCOUNTER — Encounter: Payer: Self-pay | Admitting: Internal Medicine

## 2014-03-23 ENCOUNTER — Telehealth: Payer: Self-pay | Admitting: Endocrinology

## 2014-03-23 ENCOUNTER — Telehealth: Payer: Self-pay | Admitting: Internal Medicine

## 2014-03-23 ENCOUNTER — Other Ambulatory Visit: Payer: Self-pay | Admitting: Internal Medicine

## 2014-03-23 NOTE — Telephone Encounter (Signed)
Pt calling to set up an appt

## 2014-03-23 NOTE — Telephone Encounter (Signed)
Rx was already sent earlier this morning.

## 2014-03-23 NOTE — Telephone Encounter (Signed)
Diabetic Bundle added to pt's upcoming appointment.

## 2014-03-27 ENCOUNTER — Emergency Department (HOSPITAL_COMMUNITY)

## 2014-03-27 ENCOUNTER — Encounter (HOSPITAL_COMMUNITY): Payer: Self-pay | Admitting: Emergency Medicine

## 2014-03-27 ENCOUNTER — Emergency Department (HOSPITAL_COMMUNITY)
Admission: EM | Admit: 2014-03-27 | Discharge: 2014-03-27 | Disposition: A | Discharge status: Home / Self Care | Attending: Emergency Medicine | Admitting: Emergency Medicine

## 2014-03-27 ENCOUNTER — Other Ambulatory Visit: Payer: Self-pay | Admitting: Endocrinology

## 2014-03-27 DIAGNOSIS — S91109A Unspecified open wound of unspecified toe(s) without damage to nail, initial encounter: Secondary | ICD-10-CM | POA: Diagnosis not present

## 2014-03-27 DIAGNOSIS — S4980XA Other specified injuries of shoulder and upper arm, unspecified arm, initial encounter: Secondary | ICD-10-CM | POA: Insufficient documentation

## 2014-03-27 DIAGNOSIS — N183 Chronic kidney disease, stage 3 unspecified: Secondary | ICD-10-CM | POA: Insufficient documentation

## 2014-03-27 DIAGNOSIS — Z87448 Personal history of other diseases of urinary system: Secondary | ICD-10-CM | POA: Insufficient documentation

## 2014-03-27 DIAGNOSIS — Z9861 Coronary angioplasty status: Secondary | ICD-10-CM | POA: Insufficient documentation

## 2014-03-27 DIAGNOSIS — Z79899 Other long term (current) drug therapy: Secondary | ICD-10-CM | POA: Diagnosis not present

## 2014-03-27 DIAGNOSIS — I2510 Atherosclerotic heart disease of native coronary artery without angina pectoris: Secondary | ICD-10-CM | POA: Insufficient documentation

## 2014-03-27 DIAGNOSIS — I129 Hypertensive chronic kidney disease with stage 1 through stage 4 chronic kidney disease, or unspecified chronic kidney disease: Secondary | ICD-10-CM | POA: Insufficient documentation

## 2014-03-27 DIAGNOSIS — S46909A Unspecified injury of unspecified muscle, fascia and tendon at shoulder and upper arm level, unspecified arm, initial encounter: Secondary | ICD-10-CM | POA: Insufficient documentation

## 2014-03-27 DIAGNOSIS — Z7982 Long term (current) use of aspirin: Secondary | ICD-10-CM | POA: Insufficient documentation

## 2014-03-27 DIAGNOSIS — J4489 Other specified chronic obstructive pulmonary disease: Secondary | ICD-10-CM | POA: Insufficient documentation

## 2014-03-27 DIAGNOSIS — S0990XA Unspecified injury of head, initial encounter: Secondary | ICD-10-CM | POA: Insufficient documentation

## 2014-03-27 DIAGNOSIS — Y9389 Activity, other specified: Secondary | ICD-10-CM | POA: Diagnosis not present

## 2014-03-27 DIAGNOSIS — J449 Chronic obstructive pulmonary disease, unspecified: Secondary | ICD-10-CM | POA: Insufficient documentation

## 2014-03-27 DIAGNOSIS — E7800 Pure hypercholesterolemia, unspecified: Secondary | ICD-10-CM | POA: Insufficient documentation

## 2014-03-27 DIAGNOSIS — Z951 Presence of aortocoronary bypass graft: Secondary | ICD-10-CM | POA: Insufficient documentation

## 2014-03-27 DIAGNOSIS — Z950 Presence of cardiac pacemaker: Secondary | ICD-10-CM | POA: Diagnosis not present

## 2014-03-27 DIAGNOSIS — I5022 Chronic systolic (congestive) heart failure: Secondary | ICD-10-CM | POA: Diagnosis not present

## 2014-03-27 DIAGNOSIS — H548 Legal blindness, as defined in USA: Secondary | ICD-10-CM | POA: Diagnosis not present

## 2014-03-27 DIAGNOSIS — Z7901 Long term (current) use of anticoagulants: Secondary | ICD-10-CM | POA: Diagnosis not present

## 2014-03-27 DIAGNOSIS — E109 Type 1 diabetes mellitus without complications: Secondary | ICD-10-CM | POA: Insufficient documentation

## 2014-03-27 DIAGNOSIS — Z794 Long term (current) use of insulin: Secondary | ICD-10-CM | POA: Insufficient documentation

## 2014-03-27 DIAGNOSIS — Z8673 Personal history of transient ischemic attack (TIA), and cerebral infarction without residual deficits: Secondary | ICD-10-CM | POA: Diagnosis not present

## 2014-03-27 DIAGNOSIS — E669 Obesity, unspecified: Secondary | ICD-10-CM | POA: Insufficient documentation

## 2014-03-27 DIAGNOSIS — S91119A Laceration without foreign body of unspecified toe without damage to nail, initial encounter: Secondary | ICD-10-CM

## 2014-03-27 DIAGNOSIS — G609 Hereditary and idiopathic neuropathy, unspecified: Secondary | ICD-10-CM | POA: Diagnosis not present

## 2014-03-27 DIAGNOSIS — Y9289 Other specified places as the place of occurrence of the external cause: Secondary | ICD-10-CM | POA: Diagnosis not present

## 2014-03-27 DIAGNOSIS — W1809XA Striking against other object with subsequent fall, initial encounter: Secondary | ICD-10-CM | POA: Insufficient documentation

## 2014-03-27 DIAGNOSIS — M1990 Unspecified osteoarthritis, unspecified site: Secondary | ICD-10-CM | POA: Diagnosis not present

## 2014-03-27 MED ORDER — TETANUS-DIPHTH-ACELL PERTUSSIS 5-2.5-18.5 LF-MCG/0.5 IM SUSP: 0.5000 mL | Freq: Once | INTRAMUSCULAR | Status: DC

## 2014-03-27 MED ORDER — HYDROCODONE-ACETAMINOPHEN 5-325 MG PO TABS
1.0000 | ORAL_TABLET | Freq: Once | ORAL | Status: AC
  Administered 2014-03-27: 1 via ORAL
  Filled 2014-03-27: qty 1

## 2014-03-27 NOTE — ED Notes (Signed)
Pt still in radiology when went to medicate pt.  Pt's wife in room.

## 2014-03-27 NOTE — ED Notes (Signed)
Pt was in recliner.  Stood up to urinate.  Pt states foot stuck to mat and did not move.  Pt fell to right side.  Pt states head hit side of table on the way down.  Pt evaluated by EMS and was deemed ok to travel by car.  Happened around 1:30 pm.  No LOC.  No altered mental status.  Pt is on blood thinners.  Pt alert and oriented.

## 2014-03-27 NOTE — Discharge Instructions (Signed)
Continue taking vicodin for pain.   Be careful not to fall.   Please have hospice nurse come to change foot dressing.   Follow up with your doctor.   Return to ER if you have severe pain, weakness, headaches, bleeding.

## 2014-03-27 NOTE — Progress Notes (Signed)
RN ED visit [facility] rm 18-[facility]) Flo Shanks RN Pt to Ed via [place] after fall at home. Pt hit his head and R shoulder. Pt seen in exam room, up in his electric scooter, wife present. Pt alert and oriented, with good sense of humor. Some bleeding noted to middle toe on L foot. Md in during visit, all scans/xrays normal. Toe dressed by Md. Plan is for pt to d/c back home. [facility] on call notified of need for f/u call this evening. Thank you Flo Shanks Rn, BSN, [facility] liaison [phone]

## 2014-03-27 NOTE — ED Provider Notes (Signed)
CSN: [id number]     Arrival date & time 03/27/14  1430 History   First MD Initiated Contact with Patient 03/27/14 1459     Chief Complaint  Patient presents with  . Fall  . Head Injury     (Consider location/radiation/quality/duration/timing/severity/associated sxs/prior Treatment) The history is provided by the patient.  OLSEN MCCUTCHAN is a 68 y.o. male hx of CAD s/p CABG, DM, CKD here with fall. He was in a recliner today and tried to stand up. His left foot got caught on a mat and he fell on the right side and hit his head. Also hit right shoulder as well. Denies LOC or syncope. Has R sided headache, R shoulder pain. Also noticed some bleeding on L foot. Tetanus up to date    Past Medical History  Diagnosis Date  . CORONARY ARTERY DISEASE     a. s/p CABG 1998. b.  08/2011: inferior STEMI.  LHC 09/07/11: Severe 3v CAD, LIMA-LAD patent, SVG-RCA chronically occluded, SVG-circumflex chronically occluded, SVG-diagonal 95%. PCI: Promus DES to the SVG-diagonal.  Procedure c/b transient CHB req CPR and TTVP. c. Myoview 11/2011: scar but no ischemia.   [name] DIABETES MELLITUS, TYPE I   . HYPERTENSION   . 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 HYPERCHOLESTEROLEMIA   . CERVICAL RADICULOPATHY, LEFT   . PERIPHERAL NEUROPATHY   . Chronic systolic heart failure     Echocardiogram 08/09/13:Cavity size normal, EF 40%, inferior hypokinesis, grade 1 diastolic dysfunction, paradoxical septal motion, mild MR, mild LAE, no PFO, PAS 51, moderate pulm HTN  . ED (erectile dysfunction)   . Hypogonadism male   . GI bleed 2012    Multiple Duodenal ulcer   . Obesity   . Legally blind     bilaterally  . Stroke 54    age 89 months; residual right sided weakness  . History of blood transfusion 2012  . Charcot foot due to diabetes mellitus     chronic pain  . Osteoarthritis     right hip and shoulder  . COPD (chronic obstructive pulmonary disease)   . PERIPHERAL VASCULAR INSUFFICIENCY,  LEGS, BILATERAL  08/17/2010  . Hiatal hernia   . Chronic stasis dermatitis     Chronic stasis changes.  . CKD (chronic kidney disease), stage III   . Syncope     a. 07/2013: suspected due to hypotension.  . Biventricular cardiac pacemaker in situ     a. [address] [employer] [place] RF model [id number] (serial number [id number])  . Complete heart block     a. s/p pacemaker placement on 08/2013 admission  . Duodenal ulcer    Past Surgical History  Procedure Laterality Date  . Carpal tunnel release Left   . Sigmoidoscopy  03/11/2001  . Venous doppler  01/30/2004  . Tonsillectomy and adenoidectomy      "as a kid"  . Cataract extraction w/ intraocular lens  implant, bilateral    . Coronary angioplasty with stent placement  08/2011    "1"  . Retinopathy surgery Bilateral 2000's    "laser; both eyes"  . Wrist fusion Right 1976  . Anterior fusion cervical spine  2010    "C spine; Dr. Ronnald Ramp"  . Ankle fracture surgery Left 1976; ?date    LEFT:  fused; removed bulk of hardware  . Heel spur surgery Left ~ 2007    ? left  . Irrigation and debridement knee  02/27/2012    Procedure: IRRIGATION AND DEBRIDEMENT KNEE;  Surgeon: Augustin Schooling,  MD;  Location: [facility];  Service: Orthopedics;  Laterality: Right;  irrigation and drainage right knee septic bursitis  . Eye surgery  2008    cataract removal, cautery  . Coronary artery bypass graft  1998    CABG X5  . Fracture surgery      foot broken 1976  . Pacemaker insertion  08/08/2013    Biventricular pacemaker: [place] RF model [id number] (serial number [id number])   Family History  Problem Relation Age of Onset  . Heart disease Mother     Coronary Artery Disease  . Heart disease Father     Coronary Artery Disease  . Heart disease Paternal Grandmother     Coronary Artery Disease  . Colon cancer Neg Hx   . Diabetes Maternal Grandmother   . Cancer Maternal Grandmother     male cancer ? type  . Diabetes Brother    History  Substance Use  Topics  . Smoking status: Never Smoker   . Smokeless tobacco: Never Used  . Alcohol Use: No    Review of Systems  Musculoskeletal:       R shoulder pain   Skin: Positive for wound.  Neurological: Positive for headaches.  All other systems reviewed and are negative.     Allergies  Sildenafil; Percocet; and Contrast media  Home Medications   Prior to Admission medications   Medication Sig Start Date End Date Taking? Authorizing Provider  aspirin EC 81 MG tablet Take 81 mg by mouth at bedtime.   Yes Historical Provider, MD  atorvastatin (LIPITOR) 40 MG tablet Take 40 mg by mouth daily.   Yes Historical Provider, MD  calcitRIOL (ROCALTROL) 0.25 MCG capsule Take 1 capsule (0.25 mcg total) by mouth daily. 08/22/13  Yes [name], MD  carvedilol (COREG) 3.125 MG tablet Take 1 tablet (3.125 mg total) by mouth 2 (two) times daily with a meal. 08/20/13  Yes Brittainy Simmons, PA-C  clopidogrel (PLAVIX) 75 MG tablet Take 75 mg by mouth daily with breakfast.   Yes Historical Provider, MD  co-enzyme Q-10 30 MG capsule Take 30 mg by mouth daily at 2 PM daily at 2 PM.    Yes Historical Provider, MD  docusate sodium (COLACE) 100 MG capsule Take 100 mg by mouth 2 (two) times daily as needed for constipation.   Yes Historical Provider, MD  furosemide (LASIX) 40 MG tablet Take 80 mg by mouth 2 (two) times daily.   Yes Historical Provider, MD  gabapentin (NEURONTIN) 400 MG capsule Take 2 capsules (800 mg total) by mouth 3 (three) times daily. 10/30/13  Yes Debbe Odea, MD  HYDROcodone-acetaminophen (NORCO) 10-325 MG per tablet Take 1-2 tablets by mouth every 4 (four) hours as needed for moderate pain. 10/13/13  Yes [name], MD  hyoscyamine (ANASPAZ) 0.125 MG TBDP disintergrating tablet Place 0.125 mg under the tongue every 4 (four) hours as needed for bladder spasms.   Yes Historical Provider, MD  insulin NPH Human (HUMULIN N,NOVOLIN N) 100 UNIT/ML injection Inject 60 Units into the skin daily  before breakfast.    Yes Historical Provider, MD  Insulin Syringe-Needle U-100 (INSULIN SYRINGE 1CC/31GX5/16") 31G X 5/16" 1 ML MISC USE AS DIRECTED TO INJECT INSULIN FOUR TIMES DAILY 11/20/13  Yes [name], MD  isosorbide mononitrate (IMDUR) 30 MG 24 hr tablet Take 30 mg by mouth daily.   Yes Historical Provider, MD  LORazepam (ATIVAN) 0.5 MG tablet Take 0.5 mg by mouth every 8 (eight) hours.  Yes Historical Provider, MD  losartan (COZAAR) 50 MG tablet Take 0.5 tablets (25 mg total) by mouth daily. 08/20/13  Yes Brittainy Rosita Fire, PA-C  Morphine Sulfate (MORPHINE CONCENTRATE) 10 mg / 0.5 ml concentrated solution Place 0.25-0.5 mLs (5-10 mg total) under the tongue every 2 (two) hours as needed for moderate pain, severe pain, anxiety or shortness of breath. 10/30/13  Yes Debbe Odea, MD  nitroGLYCERIN (NITROSTAT) 0.4 MG SL tablet Place 1 tablet (0.4 mg total) under the tongue every 5 (five) minutes as needed for chest pain. 10/29/12  Yes Sherren Mocha, MD  OVER THE COUNTER MEDICATION Place 1 drop into both eyes daily as needed (dry eyes). [facility] eye drops   Yes Historical Provider, MD  pantoprazole (PROTONIX) 40 MG tablet Take 1 tablet (40 mg total) by mouth 2 (two) times daily. 02/24/14  Yes Lafayette Dragon, MD  potassium chloride SA (K-DUR,KLOR-CON) 20 MEQ tablet Take 1 tablet (20 mEq total) by mouth 2 (two) times daily. 11/12/13  Yes Sherren Mocha, MD  silodosin (RAPAFLO) 8 MG CAPS capsule Take 1 capsule (8 mg total) by mouth at bedtime. 12/10/13  Yes [name], MD   BP 115/48  Pulse 67  Temp(Src) 97.9 F (36.6 C) (Oral)  Resp 20  Ht 5\' 1"  (1.549 m)  Wt 214 lb (97.07 kg)  BMI 40.46 kg/m2  SpO2 97% Physical Exam  Nursing note and vitals reviewed. Constitutional: He is oriented to person, place, and time.  Chronically ill, slightly uncomfortable   HENT:  Head: Normocephalic.  Mouth/Throat: Oropharynx is clear and moist.  R temporal area with a soft spot. No obvious hematoma.   Eyes:  Conjunctivae and EOM are normal. Pupils are equal, round, and reactive to light.  Neck: Neck supple.  Cardiovascular: Normal rate, regular rhythm and normal heart sounds.   Pulmonary/Chest: Effort normal and breath sounds normal. No respiratory distress. He has no wheezes. He has no rales.  Abdominal: Soft. Bowel sounds are normal. He exhibits no distension. There is no tenderness. There is no rebound and no guarding.  Musculoskeletal:  Minimal tenderness R shoulder, nl ROM. Bilateral hips nl ROM. L 3rd toe with abrasion with skin partially avulsed and some bleeding. 2+ DP pulse. No foot tenderness otherwise   Neurological: He is alert and oriented to person, place, and time. No cranial nerve deficit. Coordination normal.  Skin: Skin is warm and dry.  Psychiatric: He has a normal mood and affect. His behavior is normal. Judgment and thought content normal.    ED Course  Procedures (including critical care time)  LACERATION REPAIR Performed by: Shirlyn Goltz Authorized by: Shirlyn Goltz Consent: Verbal consent obtained. Risks and benefits: risks, benefits and alternatives were discussed Consent given by: patient Patient identity confirmed: provided demographic data Prepped and Draped in normal sterile fashion Wound explored  Laceration Location: L 3rd toe  Laceration Length: 2 cm  No Foreign Bodies seen or palpated  Anesthesia: none  Local anesthetic: none  Irrigation method: syringe Amount of cleaning: standard  Skin closure: dermabond, surgicel  Patient tolerance: Patient tolerated the procedure well with no immediate complications.  Labs Review Labs Reviewed - No data to display  Imaging Review Dg Shoulder Right  03/27/2014   CLINICAL DATA:  [place] after arising from a recliner, RIGHT shoulder pain  EXAM: RIGHT SHOULDER - 2+ VIEW  COMPARISON:  None  FINDINGS: Osseous demineralization.  AC joint alignment normal.  No gross fracture or glenohumeral dislocation.  Visualized  RIGHT ribs intact.  Post  cervical spine fusion, CABG and pacemaker.  IMPRESSION: No acute osseous abnormalities.   Electronically Signed   By: Lavonia Dana M.D.   On: 03/27/2014 15:54   Ct Head Wo Contrast  03/27/2014   CLINICAL DATA:  Status post fall striking the right-sided head now with headache and neck pain.  EXAM: CT HEAD WITHOUT CONTRAST  CT CERVICAL SPINE WITHOUT CONTRAST  TECHNIQUE: Multidetector CT imaging of the head and cervical spine was performed following the standard protocol without intravenous contrast. Multiplanar CT image reconstructions of the cervical spine were also generated.  COMPARISON:  Cervical spine CT scan of October 13, 2013. And CT scan of the brain dated July 15, 2013  FINDINGS: CT HEAD FINDINGS  There is mild age appropriate diffuse cerebral and cerebellar atrophy with compensatory ventriculomegaly. There is stable encephalomalacia in the anterior parietal lobe on the left. There is no acute intracranial hemorrhage nor evidence of acute ischemic change. The cerebellum and brainstem are unremarkable.  The observed paranasal sinuses and mastoid air cells are clear. There is no acute skull fracture nor cephalohematoma.  CT CERVICAL SPINE FINDINGS  There is mild reversal of the normal cervical lordosis which is slightly more conspicuous than in the past. The patient has undergone previous ACDF at C3-4 and C4-5. Interbody fusion is solid at 3-4; at C4-5 the disc space is still visible. The hardware is intact. Stable back out of the right cortical screw at C5 is again demonstrated. There is stable moderate degenerative change at C6-7. There is no acute fracture. There is no perched facet. The prevertebral soft tissue spaces are grossly normal.  IMPRESSION: 1. There is no acute intracranial hemorrhage nor evidence of acute ischemic change. There are stable changes of chronic small vessel ischemia and previous ischemic insult in the anterior left parietal lobe. 2. There is no acute skull  fracture. 3. There is no acute cervical spine fracture or dislocation. Reversal of the normal cervical lordosis is not entirely new but may reflect muscle spasm. Stable back out of the right C5 cortical screw is demonstrated.   Electronically Signed   By: Hunter Bachar  [place]   On: 03/27/2014 15:35   Ct Cervical Spine Wo Contrast  03/27/2014   CLINICAL DATA:  Status post fall striking the right-sided head now with headache and neck pain.  EXAM: CT HEAD WITHOUT CONTRAST  CT CERVICAL SPINE WITHOUT CONTRAST  TECHNIQUE: Multidetector CT imaging of the head and cervical spine was performed following the standard protocol without intravenous contrast. Multiplanar CT image reconstructions of the cervical spine were also generated.  COMPARISON:  Cervical spine CT scan of October 13, 2013. And CT scan of the brain dated July 15, 2013  FINDINGS: CT HEAD FINDINGS  There is mild age appropriate diffuse cerebral and cerebellar atrophy with compensatory ventriculomegaly. There is stable encephalomalacia in the anterior parietal lobe on the left. There is no acute intracranial hemorrhage nor evidence of acute ischemic change. The cerebellum and brainstem are unremarkable.  The observed paranasal sinuses and mastoid air cells are clear. There is no acute skull fracture nor cephalohematoma.  CT CERVICAL SPINE FINDINGS  There is mild reversal of the normal cervical lordosis which is slightly more conspicuous than in the past. The patient has undergone previous ACDF at C3-4 and C4-5. Interbody fusion is solid at 3-4; at C4-5 the disc space is still visible. The hardware is intact. Stable back out of the right cortical screw at C5 is again demonstrated. There is stable moderate degenerative change  at C6-7. There is no acute fracture. There is no perched facet. The prevertebral soft tissue spaces are grossly normal.  IMPRESSION: 1. There is no acute intracranial hemorrhage nor evidence of acute ischemic change. There are stable changes of  chronic small vessel ischemia and previous ischemic insult in the anterior left parietal lobe. 2. There is no acute skull fracture. 3. There is no acute cervical spine fracture or dislocation. Reversal of the normal cervical lordosis is not entirely new but may reflect muscle spasm. Stable back out of the right C5 cortical screw is demonstrated.   Electronically Signed   By: Eman Morimoto  [place]   On: 03/27/2014 15:35   Dg Toe 3rd Left  03/27/2014   CLINICAL DATA:  Pain post trauma  EXAM: LEFT THIRD TOE  COMPARISON:  None.  FINDINGS: Frontal, oblique, and lateral views were obtained. There is a bandage overlying the third digit. No acute fracture or dislocation. Note that there is evidence of an old healed fracture of the fifth proximal phalanx. There is moderate osteoarthritic change in all visualized PIP and DIP joints. Bones are osteoporotic.  IMPRESSION: Multifocal osteoarthritic change.  No acute fracture or dislocation.   Electronically Signed   By: Lowella Grip M.D.   On: 03/27/2014 15:57     EKG Interpretation None      MDM   Final diagnoses:  None   IAM LIPSON is a 68 y.o. male here with fall on plavix. Will get CT head/neck. Will get xrays. Will reassess after pain meds.   4:41 PM CT head/neck showed no new fracture or bleed. Had loose screw but unchanged. xrays neg. Able to stop bleeding from foot laceration with dermabond and surgicel. Will d/c home.      Wandra Arthurs, MD 03/27/14 [phone]

## 2014-04-06 ENCOUNTER — Telehealth: Payer: Self-pay | Admitting: Internal Medicine

## 2014-04-06 NOTE — Telephone Encounter (Signed)
Patient has 2 refills left on Levsin (this was given 03/23/14 for #30 with 2 refills).

## 2014-04-07 ENCOUNTER — Other Ambulatory Visit: Payer: Self-pay | Admitting: Cardiovascular Disease

## 2014-04-07 ENCOUNTER — Other Ambulatory Visit (HOSPITAL_COMMUNITY): Payer: Self-pay | Admitting: Cardiology

## 2014-04-07 ENCOUNTER — Other Ambulatory Visit: Payer: Self-pay

## 2014-04-22 ENCOUNTER — Encounter: Payer: Self-pay | Admitting: Cardiovascular Disease

## 2014-04-22 ENCOUNTER — Ambulatory Visit (INDEPENDENT_AMBULATORY_CARE_PROVIDER_SITE_OTHER): Payer: Medicare Other | Admitting: Cardiovascular Disease

## 2014-04-22 VITALS — BP 114/56 | HR 65 | Ht 61.0 in

## 2014-04-22 DIAGNOSIS — I5022 Chronic systolic (congestive) heart failure: Secondary | ICD-10-CM | POA: Diagnosis not present

## 2014-04-22 NOTE — Patient Instructions (Signed)
Your physician recommends that you return for lab work in: 3 MONTHS (BMP and BNP--1 WEEK prior to Dr Burt Knack appointment)  Your physician wants you to follow-up in: January 2016 with Dr Burt Knack.  You will receive a reminder letter in the mail two months in advance. If you don't receive a letter, please call our office to schedule the follow-up appointment.  Your physician recommends that you continue on your current medications as directed. Please refer to the Current Medication list given to you today.

## 2014-04-23 ENCOUNTER — Encounter: Payer: Self-pay | Admitting: Cardiovascular Disease

## 2014-04-23 NOTE — Progress Notes (Signed)
HPI:   Mark French is a 68 y.o. male with a history of obesity, chronic systolic HF with EF of 96%, CVA, PVD, CKD, DM, complete heart block s/p pacemaker placement and CAD who was re-admitted on 08/18/13 for weakness and hypotension. He was hospitalized 01/01-01/01/2014 with SOB and chest pain. He had a cardiac catheterization on that admission which showed severe native 3 vessel disease with LIMA-LAD and SVG-Diag patent, CFX occluded and RCA with severe diffuse proximal and distal disease. The SVG-OM and SVG-RCA are known to be occluded. Medical therapy was recommended. He was also found to be in complete heart block and he had a [employer] Jude BiV PPM placed, on 01/02. His EF was 40% by echo at this time.  The patient was readmitted to the hospital on 10/25/13 with hypoxia initially requiring BiPAP.He had a NSTEMI with peak troponin went up to 5. and repeat cardiac catheterization was performed. This showed diffuse triple vessel disease. 2-D echo showed ejection fraction remained at 40%. After discussion with the family and patient, palliative medicine was recommended. He is now being followed by hospice.   He has done remarkably well over the last few months. Hospice is still managing him, but he is likely to be discharged from their care in the near future considering his clinical stability. He feels reasonably well, remains sedentary, and has no shortness of breath at rest. He denies chest pain, orthopnea, or PND. Leg swelling/venous ulcerations persist but have been improved. No other complaints. He's lost weight and attributes this to his clinical improvement more than anything else. He and his wife have been very pleased with the care they've received from Hospice.  Outpatient Encounter Prescriptions as of 04/22/2014  Medication Sig  . aspirin EC 81 MG tablet Take 81 mg by mouth at bedtime.  [name] atorvastatin (LIPITOR) 40 MG tablet TAKE 1 TABLET BY MOUTH EVERY DAY  . calcitRIOL (ROCALTROL) 0.25 MCG  capsule Take 1 capsule (0.25 mcg total) by mouth daily.  . carvedilol (COREG) 3.125 MG tablet TAKE 1 TABLET BY MOUTH TWICE DAILY WITH A MEAL  . clopidogrel (PLAVIX) 75 MG tablet Take 75 mg by mouth daily with breakfast.  . co-enzyme Q-10 30 MG capsule Take 30 mg by mouth daily at 2 PM daily at 2 PM.   . docusate sodium (COLACE) 100 MG capsule Take 100 mg by mouth 2 (two) times daily as needed for constipation.  . furosemide (LASIX) 40 MG tablet Take 80 mg by mouth 2 (two) times daily.  [name] gabapentin (NEURONTIN) 400 MG capsule Take 2 capsules (800 mg total) by mouth 3 (three) times daily.  [name] HYDROcodone-acetaminophen (NORCO) 10-325 MG per tablet Take 1-2 tablets by mouth every 4 (four) hours as needed for moderate pain.  . hyoscyamine (ANASPAZ) 0.125 MG TBDP disintergrating tablet Place 0.125 mg under the tongue every 4 (four) hours as needed for bladder spasms.  . insulin NPH Human (HUMULIN N,NOVOLIN N) 100 UNIT/ML injection Inject 60 Units into the skin daily before breakfast.   . Insulin Syringe-Needle U-100 (INSULIN SYRINGE 1CC/31GX5/16") 31G X 5/16" 1 ML MISC USE AS DIRECTED TO INJECT INSULIN FOUR TIMES DAILY  . isosorbide mononitrate (IMDUR) 30 MG 24 hr tablet TAKE 1 TABLET BY MOUTH EVERY DAY  . LORazepam (ATIVAN) 0.5 MG tablet Take 0.5 mg by mouth every 8 (eight) hours.  [name] losartan (COZAAR) 50 MG tablet Take 0.5 tablets (25 mg total) by mouth daily.  . Morphine Sulfate (MORPHINE CONCENTRATE) 10 mg /  0.5 ml concentrated solution Place 0.25-0.5 mLs (5-10 mg total) under the tongue every 2 (two) hours as needed for moderate pain, severe pain, anxiety or shortness of breath.  . nitroGLYCERIN (NITROSTAT) 0.4 MG SL tablet Place 1 tablet (0.4 mg total) under the tongue every 5 (five) minutes as needed for chest pain.  [name] OVER THE COUNTER MEDICATION Place 1 drop into both eyes daily as needed (dry eyes). [facility] eye drops  . pantoprazole (PROTONIX) 40 MG tablet Take 1 tablet (40 mg total) by mouth 2  (two) times daily.  . potassium chloride SA (K-DUR,KLOR-CON) 20 MEQ tablet Take 1 tablet (20 mEq total) by mouth 2 (two) times daily.  . silodosin (RAPAFLO) 8 MG CAPS capsule Take 1 capsule (8 mg total) by mouth at bedtime.  . sucralfate (CARAFATE) 1 G tablet Take 1 tablet by mouth 2 (two) times daily.  . sertraline (ZOLOFT) 50 MG tablet Take 50 mg by mouth daily.    Allergies  Allergen Reactions  . Sildenafil Nausea Only and Other (See Comments)    "took it once; heart went to pieces; never took it again"  . Percocet [Oxycodone-Acetaminophen] Other (See Comments)    Unknown reaction-possible altered mental state-per wife.    . Contrast Media [Iodinated Diagnostic Agents] Nausea Only    Past Medical History  Diagnosis Date  . CORONARY ARTERY DISEASE     a. s/p CABG 1998. b.  08/2011: inferior STEMI.  LHC 09/07/11: Severe 3v CAD, LIMA-LAD patent, SVG-RCA chronically occluded, SVG-circumflex chronically occluded, SVG-diagonal 95%. PCI: Promus DES to the SVG-diagonal.  Procedure c/b transient CHB req CPR and TTVP. c. Myoview 11/2011: scar but no ischemia.   [name] DIABETES MELLITUS, TYPE I   . HYPERTENSION   . 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 HYPERCHOLESTEROLEMIA   . CERVICAL RADICULOPATHY, LEFT   . PERIPHERAL NEUROPATHY   . Chronic systolic heart failure     Echocardiogram 08/09/13:Cavity size normal, EF 40%, inferior hypokinesis, grade 1 diastolic dysfunction, paradoxical septal motion, mild MR, mild LAE, no PFO, PAS 51, moderate pulm HTN  . ED (erectile dysfunction)   . Hypogonadism male   . GI bleed 2012    Multiple Duodenal ulcer   . Obesity   . Legally blind     bilaterally  . Stroke 69    age 35 months; residual right sided weakness  . History of blood transfusion 2012  . Charcot foot due to diabetes mellitus     chronic pain  . Osteoarthritis     right hip and shoulder  . COPD (chronic obstructive pulmonary disease)   . PERIPHERAL VASCULAR INSUFFICIENCY,  LEGS,  BILATERAL 08/17/2010  . Hiatal hernia   . Chronic stasis dermatitis     Chronic stasis changes.  . CKD (chronic kidney disease), stage III   . Syncope     a. 07/2013: suspected due to hypotension.  . Biventricular cardiac pacemaker in situ     a. [address] [employer] [place] RF model [id number] (serial number [id number])  . Complete heart block     a. s/p pacemaker placement on 08/2013 admission  . Duodenal ulcer     ROS: Negative except as per HPI  BP 114/56  Pulse 65  Ht 5\' 1"  (1.549 m)  SpO2 97%  PHYSICAL EXAM: Pt is alert and oriented, pleasant, chronically ill-appearing male in NAD HEENT: normal Neck: JVP - normal, carotids 2+= without bruits Lungs: CTA bilaterally CV: RRR without murmur or gallop Abd: soft, NT, Positive BS, obese  Ext: 1+ pretibial edema, venous stasis ulcers bilaterally left worse than right, unchanged from baseline exam.  2D Echo 10/27/13: Study Conclusions  - Left ventricle: Poor image quality Contrast helped a bit. Inferior wall appears severely hypokinetic The cavity size was moderately dilated. The estimated ejection fraction was 40%. - Mitral valve: Calcified annulus. Mildly thickened leaflets . Mild regurgitation. - Left atrium: The atrium was moderately dilated. - Atrial septum: No defect or patent foramen ovale was identified. - Pulmonary arteries: PA peak pressure: 62mm Hg (S).  Cardiac Cath 10/27/13: ANGIOGRAPHIC DATA: The left main coronary artery is diffusely diseased but patent..  The left anterior descending artery is totally occluded in the mid vessel. Severely and diffusely diseased throughout the entire proximal one half of the vessel with up to 95% stenosis noted after the first septal perforator. The LAD and all branches are small and severely/diffusely involved in a pattern compatible with long-standing diabetes mellitus..  The left circumflex artery is 1% occluded proximally.  The right coronary artery is severely and diffusely  diseased and totally occluded distally after the PDA.[name]  BYPASS GRAFT ANGIOGRAPHY:  SVG to circumflex: Totally occluded  SVG to diagonal: Diffusely diseased. The mid graft 70% stenosis proximal to overlapping stents in the distal body. The diagonal is small and diffusely diseased.  SVG to RCA: Totally occluded  Left internal mammograph to LAD: Widely patent with severe diffuse narrowing with the appearance of "small diabetic vessels".  LEFT VENTRICULOGRAM: Left ventricular angiogram was done in the 30 RAO projection and revealed very poor inferior wall contractility. Anterior wall contractility appears normal. Ejection fraction is in the 35-40% range. This is a poor quality ventriculogram as it was generated only by hand injection through the multipurpose catheter. We did not hour inject because of severely elevated LVEDP.  IMPRESSIONS: 1. 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 The mid LAD is totally occluded, circumflex 100% occluded proximally, and RCA occluded distally.  2. 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 The saphenous vein graft to the diagonal contains moderate disease, patent overlapping distal stents, and ends on a small diagonal with relatively poor runoff.  3. Left ventricular systolic dysfunction with evidence of heart failure denoted by significant LVEDP elevation of 33 mmHg  4. Patent LIMA to LAD. The LAD distal to the LIMA insertion is severely and diffusely diseased " diabetic pattern"  RECOMMENDATION: 1. Aggressive therapy of heart failure with additional diuresis as tolerated by blood pressure  2. I would not repeatedly perform angiography on this patient for enzyme leaks because of the diffuse nature of his coronary disease process.  3. Primary focus of therapy should be control of symptoms with medications.      ASSESSMENT AND PLAN: 1. Chronic Systolic  Heart Failure, NYHA Class 3. Overall stable on current Rx. Hospice care has provided great benefit. He is seeking further help in case he is discharged from Hospice services. I will see him back in 3 months with labs prior to that visit.   2. CAD s/p CABG, no angina at present. Continue current Rx.  3. Type 1 Diabetes with microvascular complications (retinopathy/neuropathy), renal manifestation, and PAD. Managed by Dr Loanne Drilling. Overall appears stable.  Sherren Mocha 04/23/2014 11:37 AM

## 2014-04-30 ENCOUNTER — Other Ambulatory Visit: Payer: Self-pay | Admitting: Internal Medicine

## 2014-05-04 ENCOUNTER — Other Ambulatory Visit (HOSPITAL_COMMUNITY): Payer: Self-pay | Admitting: Cardiovascular Disease

## 2014-05-04 ENCOUNTER — Other Ambulatory Visit: Payer: Self-pay | Admitting: Endocrinology

## 2014-05-12 ENCOUNTER — Encounter: Payer: Self-pay | Admitting: Endocrinology

## 2014-05-12 ENCOUNTER — Ambulatory Visit (INDEPENDENT_AMBULATORY_CARE_PROVIDER_SITE_OTHER): Payer: Medicare Other | Admitting: Endocrinology

## 2014-05-12 VITALS — BP 120/54 | HR 70 | Temp 98.2°F | Wt 206.0 lb

## 2014-05-12 DIAGNOSIS — Z23 Encounter for immunization: Secondary | ICD-10-CM | POA: Diagnosis not present

## 2014-05-12 DIAGNOSIS — N259 Disorder resulting from impaired renal tubular function, unspecified: Secondary | ICD-10-CM

## 2014-05-12 DIAGNOSIS — E1142 Type 2 diabetes mellitus with diabetic polyneuropathy: Secondary | ICD-10-CM

## 2014-05-12 LAB — URINALYSIS, ROUTINE W REFLEX MICROSCOPIC
Bilirubin Urine: NEGATIVE
Ketones, ur: NEGATIVE
Nitrite: POSITIVE — AB
Specific Gravity, Urine: 1.01 (ref 1.000–1.030)
TOTAL PROTEIN, URINE-UPE24: NEGATIVE
Urine Glucose: NEGATIVE
Urobilinogen, UA: 0.2 (ref 0.0–1.0)
pH: 7 (ref 5.0–8.0)

## 2014-05-12 LAB — HEMOGLOBIN A1C: Hgb A1c MFr Bld: 6.4 % (ref 4.6–6.5)

## 2014-05-12 MED ORDER — CEFUROXIME AXETIL 500 MG PO TABS: 500.0000 mg | ORAL_TABLET | Freq: Two times a day (BID) | ORAL | Status: DC

## 2014-05-12 MED ORDER — TAMSULOSIN HCL 0.4 MG PO CAPS: 0.4000 mg | ORAL_CAPSULE | Freq: Every day | ORAL | Status: DC

## 2014-05-12 NOTE — Patient Instructions (Addendum)
Please come back for a follow-up appointment in 3 months.   check your blood sugar twice a day.  vary the time of day when you check, between before the 3 meals, and at bedtime.  also check if you have symptoms of your blood sugar being too high or too low.  please keep a record of the readings and bring it to your next appointment here.  You can write it on any piece of paper.  please call us sooner if your blood sugar goes below 70, or if you have a lot of readings over 200.  On this type of insulin schedule, you should eat meals on a regular schedule.  If a meal is missed or significantly delayed, your blood sugar could go low.   Blood and urine tests are being requested for you today.  We'll contact you with results. Please let me know if you want to do a special swallowing x-ray.  Please come back for a "medicare wellness" appointment after 08/21/14.

## 2014-05-12 NOTE — Progress Notes (Signed)
Subjective:    Patient ID: Mark French, male    DOB: 18-Jul-1946, 68 y.o.   MRN: [id number]  HPI History is from patient and wife.   Pt returns for f/u of diabetes mellitus: DM type: Insulin-requiring type 2 Dx'ed: 4098 Complications: polyneuropathy, CAD, renal insufficiency, and retinopathy Therapy: NPH insulin DKA: never Severe hypoglycemia: never Pancreatitis: never Other: therapy has been limited by pt's need for a simple, inexpensive insulin regimen Interval history:  he brings a record of his cbg's which i have reviewed today.  All are in the low to mid-100's.  There is no trend throughout the day.   He has slight difficulty swallowing in the neck, but no assoc odynophagia.   Past Medical History  Diagnosis Date  . CORONARY ARTERY DISEASE     a. s/p CABG 1998. b.  08/2011: inferior STEMI.  LHC 09/07/11: Severe 3v CAD, LIMA-LAD patent, SVG-RCA chronically occluded, SVG-circumflex chronically occluded, SVG-diagonal 95%. PCI: Promus DES to the SVG-diagonal.  Procedure c/b transient CHB req CPR and TTVP. c. Myoview 11/2011: scar but no ischemia.   [name] DIABETES MELLITUS, TYPE I   . HYPERTENSION   . 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 HYPERCHOLESTEROLEMIA   . CERVICAL RADICULOPATHY, LEFT   . PERIPHERAL NEUROPATHY   . Chronic systolic heart failure     Echocardiogram 08/09/13:Cavity size normal, EF 40%, inferior hypokinesis, grade 1 diastolic dysfunction, paradoxical septal motion, mild MR, mild LAE, no PFO, PAS 51, moderate pulm HTN  . ED (erectile dysfunction)   . Hypogonadism male   . GI bleed 2012    Multiple Duodenal ulcer   . Obesity   . Legally blind     bilaterally  . Stroke 83    age 60 months; residual right sided weakness  . History of blood transfusion 2012  . Charcot foot due to diabetes mellitus     chronic pain  . Osteoarthritis     right hip and shoulder  . COPD (chronic obstructive pulmonary disease)   . PERIPHERAL VASCULAR INSUFFICIENCY,  LEGS,  BILATERAL 08/17/2010  . Hiatal hernia   . Chronic stasis dermatitis     Chronic stasis changes.  . CKD (chronic kidney disease), stage III   . Syncope     a. 07/2013: suspected due to hypotension.  . Biventricular cardiac pacemaker in situ     a. [address] [employer] [place] RF model [id number] (serial number [id number])  . Complete heart block     a. s/p pacemaker placement on 08/2013 admission  . Duodenal ulcer     Past Surgical History  Procedure Laterality Date  . Carpal tunnel release Left   . Sigmoidoscopy  03/11/2001  . Venous doppler  01/30/2004  . Tonsillectomy and adenoidectomy      "as a kid"  . Cataract extraction w/ intraocular lens  implant, bilateral    . Coronary angioplasty with stent placement  08/2011    "1"  . Retinopathy surgery Bilateral 2000's    "laser; both eyes"  . Wrist fusion Right 1976  . Anterior fusion cervical spine  2010    "C spine; Dr. Ronnald Ramp"  . Ankle fracture surgery Left 1976; ?date    LEFT:  fused; removed bulk of hardware  . Heel spur surgery Left ~ 2007    ? left  . Irrigation and debridement knee  02/27/2012    Procedure: IRRIGATION AND DEBRIDEMENT KNEE;  Surgeon: Augustin Schooling, MD;  Location: [facility];  Service: Orthopedics;  Laterality: Right;  irrigation and drainage right knee septic bursitis  . Eye surgery  2008    cataract removal, cautery  . Coronary artery bypass graft  1998    CABG X5  . Fracture surgery      foot broken 1976  . Pacemaker insertion  08/08/2013    Biventricular pacemaker: [place] RF model [id number] (serial number [id number])    History   Social History  . Marital Status: Married    Spouse Name: N/A    Number of Children: 0  . Years of Education: N/A   Occupational History  . Disabled    Social History Main Topics  . Smoking status: Never Smoker   . Smokeless tobacco: Never Used  . Alcohol Use: No  . Drug Use: No  . Sexual Activity: No   Other Topics Concern  . Not on file   Social  History Narrative   Comes to appointments with wife      0 Caffeine drinks daily     Current Outpatient Prescriptions on File Prior to Visit  Medication Sig Dispense Refill  . aspirin EC 81 MG tablet Take 81 mg by mouth at bedtime.      [name] atorvastatin (LIPITOR) 40 MG tablet TAKE 1 TABLET BY MOUTH EVERY DAY  30 tablet  3  . calcitRIOL (ROCALTROL) 0.25 MCG capsule Take 1 capsule (0.25 mcg total) by mouth daily.  30 capsule  11  . carvedilol (COREG) 3.125 MG tablet TAKE 1 TABLET BY MOUTH TWICE DAILY WITH A MEAL  60 tablet  3  . clopidogrel (PLAVIX) 75 MG tablet Take 75 mg by mouth daily with breakfast.      . co-enzyme Q-10 30 MG capsule Take 30 mg by mouth daily at 2 PM daily at 2 PM.       . docusate sodium (COLACE) 100 MG capsule Take 100 mg by mouth 2 (two) times daily as needed for constipation.      . furosemide (LASIX) 40 MG tablet Take 80 mg by mouth 2 (two) times daily.      [name] gabapentin (NEURONTIN) 400 MG capsule Take 2 capsules (800 mg total) by mouth 3 (three) times daily.  180 capsule  3  . HYDROcodone-acetaminophen (NORCO) 10-325 MG per tablet Take 1-2 tablets by mouth every 4 (four) hours as needed for moderate pain.  150 tablet  0  . hyoscyamine (ANASPAZ) 0.125 MG TBDP disintergrating tablet Place 0.125 mg under the tongue every 4 (four) hours as needed for bladder spasms.      . hyoscyamine (LEVSIN SL) 0.125 MG SL tablet DISSOLVE 1 TABLET UNDER THE TONGUE EVERY 4 HOURS AS NEEDED  30 tablet  1  . insulin NPH Human (HUMULIN N,NOVOLIN N) 100 UNIT/ML injection Inject 60 Units into the skin daily before breakfast.       . Insulin Syringe-Needle U-100 (INSULIN SYRINGE 1CC/31GX5/16") 31G X 5/16" 1 ML MISC USE AS DIRECTED TO INJECT INSULIN FOUR TIMES DAILY  120 each  0  . isosorbide mononitrate (IMDUR) 30 MG 24 hr tablet TAKE 1 TABLET BY MOUTH EVERY DAY  30 tablet  3  . LORazepam (ATIVAN) 0.5 MG tablet Take 0.5 mg by mouth every 8 (eight) hours.      [name] losartan (COZAAR) 50 MG tablet  Take 0.5 tablets (25 mg total) by mouth daily.  30 tablet  5  . Morphine Sulfate (MORPHINE CONCENTRATE) 10 mg / 0.5 ml concentrated solution Place 0.25-0.5 mLs (5-10 mg total) under the  tongue every 2 (two) hours as needed for moderate pain, severe pain, anxiety or shortness of breath.  15 mL  0  . nitroGLYCERIN (NITROSTAT) 0.4 MG SL tablet Place 1 tablet (0.4 mg total) under the tongue every 5 (five) minutes as needed for chest pain.  25 tablet  5  . OVER THE COUNTER MEDICATION Place 1 drop into both eyes daily as needed (dry eyes). [facility] eye drops      . pantoprazole (PROTONIX) 40 MG tablet Take 1 tablet (40 mg total) by mouth 2 (two) times daily.  60 tablet  3  . potassium chloride SA (K-DUR,KLOR-CON) 20 MEQ tablet Take 1 tablet (20 mEq total) by mouth 2 (two) times daily.  60 tablet  6  . sertraline (ZOLOFT) 50 MG tablet Take 50 mg by mouth daily.      . sucralfate (CARAFATE) 1 G tablet Take 1 tablet by mouth 2 (two) times daily.       No current facility-administered medications on file prior to visit.    Allergies  Allergen Reactions  . Sildenafil Nausea Only and Other (See Comments)    "took it once; heart went to pieces; never took it again"  . Percocet [Oxycodone-Acetaminophen] Other (See Comments)    Unknown reaction-possible altered mental state-per wife.    . Contrast Media [Iodinated Diagnostic Agents] Nausea Only    Family History  Problem Relation Age of Onset  . Heart disease Mother     Coronary Artery Disease  . Heart disease Father     Coronary Artery Disease  . Heart disease Paternal Grandmother     Coronary Artery Disease  . Colon cancer Neg Hx   . Diabetes Maternal Grandmother   . Cancer Maternal Grandmother     male cancer ? type  . Diabetes Brother     BP 120/54  Pulse 70  Temp(Src) 98.2 F (36.8 C) (Oral)  Wt 206 lb (93.441 kg)  SpO2 96%    Review of Systems He denies hypoglycemia and weight change.  He has cloudy urine, but no dysuria.       Objective:   Physical Exam VITAL SIGNS:  See vs page GENERAL: no distress Ext: 1+ bilat leg edema.  the left great toe is absent. There is bilateral onychomycosis.  no deformity.  Skin: There is (chronic) hyperpigmentation of the legs. There are multiple abrasions of the anterior tibial areas and the toes. No drainage from any of these.  no ulcer on the feet, but skin is dry. normal temp.   Pulses: dorsalis pedis intact bilat.  Neuro: sensation is intact to touch on the feet, but decreased from normal.    Lab Results  Component Value Date   HGBA1C 6.4 05/12/2014       Assessment & Plan:  DM: overcontrolled, given this regimen, which does match insulin to her changing needs throughout the day. Dysphagia, mild, new, uncertain etiology.  Patient is advised the following: Patient Instructions  Please come back for a follow-up appointment in 3 months.   check your blood sugar twice a day.  vary the time of day when you check, between before the 3 meals, and at bedtime.  also check if you have symptoms of your blood sugar being too high or too low.  please keep a record of the readings and bring it to your next appointment here.  You can write it on any piece of paper.  please call us sooner if your blood sugar goes below 70, or  if you have a lot of readings over 200.  On this type of insulin schedule, you should eat meals on a regular schedule.  If a meal is missed or significantly delayed, your blood sugar could go low.   Blood and urine tests are being requested for you today.  We'll contact you with results. Please let me know if you want to do a special swallowing x-ray.  Please come back for a "medicare wellness" appointment after 08/21/14.

## 2014-05-19 ENCOUNTER — Encounter: Payer: Self-pay | Admitting: Internal Medicine

## 2014-05-19 ENCOUNTER — Ambulatory Visit (INDEPENDENT_AMBULATORY_CARE_PROVIDER_SITE_OTHER): Admitting: Internal Medicine

## 2014-05-19 VITALS — BP 110/60 | HR 64 | Ht 61.0 in | Wt 205.0 lb

## 2014-05-19 DIAGNOSIS — G8929 Other chronic pain: Secondary | ICD-10-CM

## 2014-05-19 DIAGNOSIS — R1031 Right lower quadrant pain: Secondary | ICD-10-CM

## 2014-05-19 LAB — CULTURE, URINE COMPREHENSIVE

## 2014-05-19 MED ORDER — POLYETHYLENE GLYCOL 3350 17 GM/SCOOP PO POWD: ORAL | Status: DC

## 2014-05-19 MED ORDER — HYOSCYAMINE SULFATE 0.125 MG SL SUBL: SUBLINGUAL_TABLET | SUBLINGUAL | Status: DC

## 2014-05-19 NOTE — Progress Notes (Signed)
Patient ID: Mark French, male   DOB: 1945/09/17, 68 y.o.   MRN: [id number]  Mark French 1946-03-30 [id number]  Note: This dictation was prepared with Dragon digital system. Any transcriptional errors that result from this procedure are unintentional.   History of Present Illness:  This is a 68 year old white male with end-stage heart disease who is under hospice care. He was seen previously on 02/24/2014 for epigastric pain which has since resolved but now has lower abdominal discomfort usually relieved by  bowel movements. A CT scan of the abdomen without IV contrast in October 2014 showed no active process. His ejection fraction is 40% and he has a history of a complete heart block. He has also had placement of a transvenous pacemaker. He is wheelchair-bound. He has been using Levsin sublingually 0.125 mg when necessary for abdominal pain with good results. He has never had a colonoscopy. He is on home oxygen.    Past Medical History  Diagnosis Date  . CORONARY ARTERY DISEASE     a. s/p CABG 1998. b.  08/2011: inferior STEMI.  LHC 09/07/11: Severe 3v CAD, LIMA-LAD patent, SVG-RCA chronically occluded, SVG-circumflex chronically occluded, SVG-diagonal 95%. PCI: Promus DES to the SVG-diagonal.  Procedure c/b transient CHB req CPR and TTVP. c. Myoview 11/2011: scar but no ischemia.   [name] DIABETES MELLITUS, TYPE I   . HYPERTENSION   . 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 HYPERCHOLESTEROLEMIA   . CERVICAL RADICULOPATHY, LEFT   . PERIPHERAL NEUROPATHY   . Chronic systolic heart failure     Echocardiogram 08/09/13:Cavity size normal, EF 40%, inferior hypokinesis, grade 1 diastolic dysfunction, paradoxical septal motion, mild MR, mild LAE, no PFO, PAS 51, moderate pulm HTN  . ED (erectile dysfunction)   . Hypogonadism male   . GI bleed 2012    Multiple Duodenal ulcer   . Obesity   . Legally blind     bilaterally  . Stroke 75    age 54 months; residual right sided weakness  .  History of blood transfusion 2012  . Charcot foot due to diabetes mellitus     chronic pain  . Osteoarthritis     right hip and shoulder  . COPD (chronic obstructive pulmonary disease)   . 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 Hiatal hernia   . Chronic stasis dermatitis     Chronic stasis changes.  . CKD (chronic kidney disease), stage III   . Syncope     a. 07/2013: suspected due to hypotension.  . Biventricular cardiac pacemaker in situ     a. [address] [employer] [place] RF model [id number] (serial number [id number])  . Complete heart block     a. s/p pacemaker placement on 08/2013 admission  . Duodenal ulcer     Past Surgical History  Procedure Laterality Date  . Carpal tunnel release Left   . Sigmoidoscopy  03/11/2001  . Venous doppler  01/30/2004  . Tonsillectomy and adenoidectomy      "as a kid"  . Cataract extraction w/ intraocular lens  implant, bilateral    . Coronary angioplasty with stent placement  08/2011    "1"  . Retinopathy surgery Bilateral 2000's    "laser; both eyes"  . Wrist fusion Right 1976  . Anterior fusion cervical spine  2010    "C spine; Dr. Ronnald Ramp"  . Ankle fracture surgery Left 1976; ?date    LEFT:  fused; removed bulk of hardware  . Heel spur surgery Left ~  2007    ? left  . Irrigation and debridement knee  02/27/2012    Procedure: IRRIGATION AND DEBRIDEMENT KNEE;  Surgeon: Augustin Schooling, MD;  Location: [facility];  Service: Orthopedics;  Laterality: Right;  irrigation and drainage right knee septic bursitis  . Eye surgery  2008    cataract removal, cautery  . Coronary artery bypass graft  1998    CABG X5  . Fracture surgery      foot broken 1976  . Pacemaker insertion  08/08/2013    Biventricular pacemaker: [place] RF model [id number] (serial number [id number])    Allergies  Allergen Reactions  . Sildenafil Nausea Only and Other (See Comments)    "took it once; heart went to pieces; never took it again"   . Percocet [Oxycodone-Acetaminophen] Other (See Comments)    Unknown reaction-possible altered mental state-per wife.    . Contrast Media [Iodinated Diagnostic Agents] Nausea Only    Family history and social history have been reviewed.  Review of Systems: Denies dysphagia nausea vomiting. Positive for weight loss of 10 pounds  The remainder of the 10 point ROS is negative except as outlined in the H&P  Physical Exam: General Appearance Well developed, in no distress, obese. In a wheelchair Eyes  Non icteric  HEENT  Non traumatic, normocephalic  Mouth No lesion, tongue papillated, no cheilosis Neck Supple without adenopathy, thyroid not enlarged, no carotid bruits, no JVD Lungs Clear to auscultation bilaterally COR Normal S1, normal S2, regular rhythm, no murmur, quiet precordium Abdomen protuberant soft with normoactive bowel sounds. No tenderness Rectal normal rectal sphincter tone. No stool in the rectal ampulla. Mucous is Hemoccult negative Extremities  No pedal edema Skin No lesions Neurological Alert and oriented x 3 Psychological Normal mood and affect  Assessment and Plan:   Problem #4 68 year old white male with end-stage heart disease and constipation due to sedentary life as well as due to multiple cardiac medications and hydrocodone. He is on Colace 4 capsules a day. We will be switching him to MiraLax 9-17 g several times a week. We will also refill Levsin sublingually 0.125 mg as it seems to help the cramps. He is not a candidate for colonoscopy although he's never had one.    Delfin Edis 05/19/2014

## 2014-05-19 NOTE — Patient Instructions (Addendum)
We have sent the following medications to your pharmacy for you to pick up at your convenience: Levsin SL Miralax  CC:Dr Loanne Drilling, Dr Silvana Newness

## 2014-05-21 ENCOUNTER — Emergency Department (HOSPITAL_COMMUNITY)
Admission: EM | Admit: 2014-05-21 | Discharge: 2014-05-21 | Disposition: A | Discharge status: Home / Self Care | Attending: Emergency Medicine | Admitting: Emergency Medicine

## 2014-05-21 ENCOUNTER — Emergency Department (HOSPITAL_COMMUNITY)

## 2014-05-21 ENCOUNTER — Encounter (HOSPITAL_COMMUNITY): Payer: Self-pay | Admitting: Emergency Medicine

## 2014-05-21 DIAGNOSIS — W010XXA Fall on same level from slipping, tripping and stumbling without subsequent striking against object, initial encounter: Secondary | ICD-10-CM | POA: Insufficient documentation

## 2014-05-21 DIAGNOSIS — Y9389 Activity, other specified: Secondary | ICD-10-CM | POA: Insufficient documentation

## 2014-05-21 DIAGNOSIS — Y998 Other external cause status: Secondary | ICD-10-CM | POA: Insufficient documentation

## 2014-05-21 DIAGNOSIS — Y92099 Unspecified place in other non-institutional residence as the place of occurrence of the external cause: Secondary | ICD-10-CM | POA: Insufficient documentation

## 2014-05-21 DIAGNOSIS — S0990XA Unspecified injury of head, initial encounter: Secondary | ICD-10-CM | POA: Diagnosis not present

## 2014-05-21 NOTE — ED Provider Notes (Signed)
Patient seen/examined in the Emergency Department in conjunction with Resident Physician Provider [facility] Patient reports recent fall with head injury Exam : awake/alert, no distress, mild tenderness to posterior scalp Plan: if imaging negative pt and family would prefer to home.  He has in home nursing care (pt is on hospice)    Sharyon Cable, MD 05/21/14 1917

## 2014-05-21 NOTE — ED Notes (Signed)
EMS: States they were called to patient's home due to fall. Stated that patient was stable. Has history of right sided weakness from stroke at the age of 2 months. States patient had hematoma on right side of head.  Vitals in rout:  BP 166/84 2L O2 via [place] HR 83 SpO2 99% RR 24 CBG 119  Per patient: States he was walking into office which is carpeted, and his diabetic shoes caught on carpet and he feel against a walker or recliner. States he hit his head on walker. States that EMS helped him get up from floor. Patient asked wife to call EMS to come. States diarrhea for past week. Denies nausea, vomiting, dizziness.

## 2014-05-21 NOTE — Discharge Instructions (Signed)
Blunt Trauma °You have been evaluated for injuries. You have been examined and your caregiver has not found injuries serious enough to require hospitalization. °It is common to have multiple bruises and sore muscles following an accident. These tend to feel worse for the first 24 hours. You will feel more stiffness and soreness over the next several hours and worse when you wake up the first morning after your accident. After this point, you should begin to improve with each passing day. The amount of improvement depends on the amount of damage done in the accident. °Following your accident, if some part of your body does not work as it should, or if the pain in any area continues to increase, you should return to the Emergency Department for re-evaluation.  °HOME CARE INSTRUCTIONS  °Routine care for sore areas should include: °· Ice to sore areas every 2 hours for 20 minutes while awake for the next 2 days. °· Drink extra fluids (not alcohol). °· Take a hot or warm shower or bath once or twice a day to increase blood flow to sore muscles. This will help you "limber up". °· Activity as tolerated. Lifting may aggravate neck or back pain. °· Only take over-the-counter or prescription medicines for pain, discomfort, or fever as directed by your caregiver. Do not use aspirin. This may increase bruising or increase bleeding if there are small areas where this is happening. °SEEK IMMEDIATE MEDICAL CARE IF: °· Numbness, tingling, weakness, or problem with the use of your arms or legs. °· A severe headache is not relieved with medications. °· There is a change in bowel or bladder control. °· Increasing pain in any areas of the body. °· Short of breath or dizzy. °· Nauseated, vomiting, or sweating. °· Increasing belly (abdominal) discomfort. °· Blood in urine, stool, or vomiting blood. °· Pain in either shoulder in an area where a shoulder strap would be. °· Feelings of lightheadedness or if you have a fainting  episode. °Sometimes it is not possible to identify all injuries immediately after the trauma. It is important that you continue to monitor your condition after the emergency department visit. If you feel you are not improving, or improving more slowly than should be expected, call your physician. If you feel your symptoms (problems) are worsening, return to the Emergency Department immediately. °Document Released: 04/19/2001 Document Revised: 10/16/2011 Document Reviewed: 03/11/2008 °ExitCare® Patient Information ©2015 [employer]. This information is not intended to replace advice given to you by your health care provider. Make sure you discuss any questions you have with your health care provider. ° °

## 2014-05-21 NOTE — ED Notes (Signed)
Patient transported to CT 

## 2014-06-02 ENCOUNTER — Other Ambulatory Visit: Payer: Self-pay | Admitting: Cardiovascular Disease

## 2014-06-04 ENCOUNTER — Other Ambulatory Visit: Payer: Self-pay

## 2014-06-04 MED ORDER — CLOPIDOGREL BISULFATE 75 MG PO TABS: 75.0000 mg | ORAL_TABLET | Freq: Every day | ORAL | Status: DC

## 2014-06-08 ENCOUNTER — Ambulatory Visit (INDEPENDENT_AMBULATORY_CARE_PROVIDER_SITE_OTHER): Admitting: *Deleted

## 2014-06-08 ENCOUNTER — Encounter: Payer: Self-pay | Admitting: Internal Medicine

## 2014-06-08 DIAGNOSIS — I442 Atrioventricular block, complete: Secondary | ICD-10-CM

## 2014-06-08 DIAGNOSIS — I5022 Chronic systolic (congestive) heart failure: Secondary | ICD-10-CM

## 2014-06-08 LAB — MDC_IDC_ENUM_SESS_TYPE_REMOTE
Battery Remaining Longevity: 81 mo
Battery Remaining Percentage: 88 %
Battery Voltage: 2.98 V
Brady Statistic AP VS Percent: 1 %
Brady Statistic AS VS Percent: 1 %
Brady Statistic RA Percent Paced: 60 %
Implantable Pulse Generator Serial Number: 7555472
Lead Channel Impedance Value: 530 Ohm
Lead Channel Impedance Value: 550 Ohm
Lead Channel Pacing Threshold Amplitude: 0.75 V
Lead Channel Pacing Threshold Amplitude: 1.25 V
Lead Channel Pacing Threshold Pulse Width: 0.5 ms
Lead Channel Pacing Threshold Pulse Width: 0.5 ms
Lead Channel Pacing Threshold Pulse Width: 0.5 ms
Lead Channel Sensing Intrinsic Amplitude: 12 mV
Lead Channel Sensing Intrinsic Amplitude: 4.5 mV
Lead Channel Setting Pacing Amplitude: 2 V
Lead Channel Setting Pacing Amplitude: 2.5 V
Lead Channel Setting Pacing Amplitude: 2.5 V
Lead Channel Setting Pacing Pulse Width: 0.5 ms
Lead Channel Setting Pacing Pulse Width: 0.5 ms
Lead Channel Setting Sensing Sensitivity: 5 mV
MDC IDC MSMT LEADCHNL LV IMPEDANCE VALUE: 830 Ohm
MDC IDC MSMT LEADCHNL RV PACING THRESHOLD AMPLITUDE: 0.75 V
MDC IDC PG MODEL: 3242
MDC IDC SESS DTM: 20151102070013
MDC IDC STAT BRADY AP VP PERCENT: 60 %
MDC IDC STAT BRADY AS VP PERCENT: 39 %

## 2014-06-08 NOTE — Progress Notes (Signed)
Remote pacemaker transmission.   

## 2014-06-09 ENCOUNTER — Other Ambulatory Visit: Payer: Self-pay | Admitting: Cardiovascular Disease

## 2014-06-09 ENCOUNTER — Emergency Department (HOSPITAL_COMMUNITY)
Admission: EM | Admit: 2014-06-09 | Discharge: 2014-06-09 | Disposition: A | Discharge status: Home / Self Care | Attending: Emergency Medicine | Admitting: Emergency Medicine

## 2014-06-09 ENCOUNTER — Emergency Department (HOSPITAL_COMMUNITY)

## 2014-06-09 ENCOUNTER — Encounter (HOSPITAL_COMMUNITY): Payer: Self-pay | Admitting: Emergency Medicine

## 2014-06-09 DIAGNOSIS — I2510 Atherosclerotic heart disease of native coronary artery without angina pectoris: Secondary | ICD-10-CM | POA: Insufficient documentation

## 2014-06-09 DIAGNOSIS — J449 Chronic obstructive pulmonary disease, unspecified: Secondary | ICD-10-CM | POA: Diagnosis not present

## 2014-06-09 DIAGNOSIS — M1990 Unspecified osteoarthritis, unspecified site: Secondary | ICD-10-CM | POA: Diagnosis not present

## 2014-06-09 DIAGNOSIS — H548 Legal blindness, as defined in USA: Secondary | ICD-10-CM | POA: Insufficient documentation

## 2014-06-09 DIAGNOSIS — S0990XA Unspecified injury of head, initial encounter: Secondary | ICD-10-CM | POA: Diagnosis present

## 2014-06-09 DIAGNOSIS — G629 Polyneuropathy, unspecified: Secondary | ICD-10-CM | POA: Diagnosis not present

## 2014-06-09 DIAGNOSIS — N183 Chronic kidney disease, stage 3 (moderate): Secondary | ICD-10-CM | POA: Diagnosis not present

## 2014-06-09 DIAGNOSIS — Z9861 Coronary angioplasty status: Secondary | ICD-10-CM | POA: Insufficient documentation

## 2014-06-09 DIAGNOSIS — Z79899 Other long term (current) drug therapy: Secondary | ICD-10-CM | POA: Diagnosis not present

## 2014-06-09 DIAGNOSIS — Z950 Presence of cardiac pacemaker: Secondary | ICD-10-CM | POA: Diagnosis not present

## 2014-06-09 DIAGNOSIS — Z7902 Long term (current) use of antithrombotics/antiplatelets: Secondary | ICD-10-CM | POA: Diagnosis not present

## 2014-06-09 DIAGNOSIS — Y9289 Other specified places as the place of occurrence of the external cause: Secondary | ICD-10-CM | POA: Insufficient documentation

## 2014-06-09 DIAGNOSIS — I5022 Chronic systolic (congestive) heart failure: Secondary | ICD-10-CM | POA: Insufficient documentation

## 2014-06-09 DIAGNOSIS — Z8719 Personal history of other diseases of the digestive system: Secondary | ICD-10-CM | POA: Insufficient documentation

## 2014-06-09 DIAGNOSIS — S2232XA Fracture of one rib, left side, initial encounter for closed fracture: Secondary | ICD-10-CM | POA: Diagnosis not present

## 2014-06-09 DIAGNOSIS — W01198A Fall on same level from slipping, tripping and stumbling with subsequent striking against other object, initial encounter: Secondary | ICD-10-CM | POA: Insufficient documentation

## 2014-06-09 DIAGNOSIS — I129 Hypertensive chronic kidney disease with stage 1 through stage 4 chronic kidney disease, or unspecified chronic kidney disease: Secondary | ICD-10-CM | POA: Insufficient documentation

## 2014-06-09 DIAGNOSIS — W19XXXA Unspecified fall, initial encounter: Secondary | ICD-10-CM

## 2014-06-09 DIAGNOSIS — Z951 Presence of aortocoronary bypass graft: Secondary | ICD-10-CM | POA: Insufficient documentation

## 2014-06-09 DIAGNOSIS — Z8673 Personal history of transient ischemic attack (TIA), and cerebral infarction without residual deficits: Secondary | ICD-10-CM | POA: Diagnosis not present

## 2014-06-09 DIAGNOSIS — S199XXA Unspecified injury of neck, initial encounter: Secondary | ICD-10-CM | POA: Diagnosis not present

## 2014-06-09 DIAGNOSIS — Z7982 Long term (current) use of aspirin: Secondary | ICD-10-CM | POA: Insufficient documentation

## 2014-06-09 DIAGNOSIS — Z794 Long term (current) use of insulin: Secondary | ICD-10-CM | POA: Insufficient documentation

## 2014-06-09 DIAGNOSIS — E780 Pure hypercholesterolemia: Secondary | ICD-10-CM | POA: Diagnosis not present

## 2014-06-09 DIAGNOSIS — E109 Type 1 diabetes mellitus without complications: Secondary | ICD-10-CM | POA: Insufficient documentation

## 2014-06-09 DIAGNOSIS — S4992XA Unspecified injury of left shoulder and upper arm, initial encounter: Secondary | ICD-10-CM | POA: Insufficient documentation

## 2014-06-09 DIAGNOSIS — E669 Obesity, unspecified: Secondary | ICD-10-CM | POA: Diagnosis not present

## 2014-06-09 DIAGNOSIS — Y9389 Activity, other specified: Secondary | ICD-10-CM | POA: Insufficient documentation

## 2014-06-09 LAB — CBC WITH DIFFERENTIAL/PLATELET
BASOS ABS: 0 10*3/uL (ref 0.0–0.1)
Basophils Relative: 0 % (ref 0–1)
Eosinophils Absolute: 0.3 10*3/uL (ref 0.0–0.7)
Eosinophils Relative: 5 % (ref 0–5)
HCT: 39.2 % (ref 39.0–52.0)
Hemoglobin: 12.3 g/dL — ABNORMAL LOW (ref 13.0–17.0)
LYMPHS ABS: 1.2 10*3/uL (ref 0.7–4.0)
Lymphocytes Relative: 22 % (ref 12–46)
MCH: 25.7 pg — ABNORMAL LOW (ref 26.0–34.0)
MCHC: 31.4 g/dL (ref 30.0–36.0)
MCV: 81.8 fL (ref 78.0–100.0)
Monocytes Absolute: 0.5 10*3/uL (ref 0.1–1.0)
Monocytes Relative: 10 % (ref 3–12)
NEUTROS PCT: 63 % (ref 43–77)
Neutro Abs: 3.4 10*3/uL (ref 1.7–7.7)
Platelets: 172 10*3/uL (ref 150–400)
RBC: 4.79 MIL/uL (ref 4.22–5.81)
RDW: 15.6 % — AB (ref 11.5–15.5)
WBC: 5.4 10*3/uL (ref 4.0–10.5)

## 2014-06-09 LAB — COMPREHENSIVE METABOLIC PANEL
ALK PHOS: 118 U/L — AB (ref 39–117)
ALT: 12 U/L (ref 0–53)
AST: 19 U/L (ref 0–37)
Albumin: 3.5 g/dL (ref 3.5–5.2)
Anion gap: 12 (ref 5–15)
BUN: 18 mg/dL (ref 6–23)
CO2: 31 meq/L (ref 19–32)
Calcium: 9 mg/dL (ref 8.4–10.5)
Chloride: 97 mEq/L (ref 96–112)
Creatinine, Ser: 1.59 mg/dL — ABNORMAL HIGH (ref 0.50–1.35)
GFR calc Af Amer: 49 mL/min — ABNORMAL LOW (ref >90)
GFR, EST NON AFRICAN AMERICAN: 42 mL/min — AB (ref >90)
Glucose, Bld: 93 mg/dL (ref 70–99)
POTASSIUM: 3.6 meq/L — AB (ref 3.7–5.3)
SODIUM: 140 meq/L (ref 137–147)
Total Bilirubin: 0.2 mg/dL — ABNORMAL LOW (ref 0.3–1.2)
Total Protein: 7.2 g/dL (ref 6.0–8.3)

## 2014-06-09 LAB — URINALYSIS, ROUTINE W REFLEX MICROSCOPIC
Bilirubin Urine: NEGATIVE
GLUCOSE, UA: NEGATIVE mg/dL
HGB URINE DIPSTICK: NEGATIVE
Ketones, ur: NEGATIVE mg/dL
Leukocytes, UA: NEGATIVE
Nitrite: NEGATIVE
PROTEIN: NEGATIVE mg/dL
Specific Gravity, Urine: 1.007 (ref 1.005–1.030)
Urobilinogen, UA: 0.2 mg/dL (ref 0.0–1.0)
pH: 7.5 (ref 5.0–8.0)

## 2014-06-09 NOTE — Progress Notes (Signed)
ED RN visit- [place] ED rm [address]-[facility] ([facility])-K Alford Highland RN Notified by [facility] RN [name] that pt was being brought to the ED by his wife for evaluation after a fall at home.  Pt seen at bedside, lying on ED stretcher, alert and oriented. Wife present and very supportive. Pt able to recall the events of his fall. Pt reports he hit the left side of his head on the wall and that "it made a lot of noise". Pt was able to get pull himself up and get into his recliner.  Pt denied any headache or changes in vision. No broken skin or bruising noted to head. He did c/o left sided back pain in the mid thoracic region. No redness or bruising noted. Chart reviewed and Probation officer spoke with staff RN's Jana Half and [place]. Current [facility] medication list given to pharmacy tech for review with pt's wife. PA Marissa Sciacca in during visit to examine pt. Plan is for pt to have a CT of his head as he is currently taking plavix.  [facility] contact number left with pt's wife. [facility] on call RN Renee notified to f/u for admission vs discharge. Please contact [phone] with any hospice needs.  Flo Shanks RN, BSN, [place] [facility], [facility]

## 2014-06-09 NOTE — ED Notes (Signed)
Pt stood up by RN at bedside, very weak and unsteady. Took great amount of assistance, unable to ambulate.

## 2014-06-09 NOTE — ED Notes (Signed)
Pt states he fell and hit left side on wall slightly x 1 hour in standing position, pt states right leg gives out. Pt states "I feel all twisted inside." Pt walks with assistance or uses electric wheelchair.

## 2014-06-09 NOTE — ED Provider Notes (Signed)
CSN: [id number]     Arrival date & time 06/09/14  1446 History   First MD Initiated Contact with Patient 06/09/14 1537     Chief Complaint  Patient presents with  . Fall  . Head Injury     (Consider location/radiation/quality/duration/timing/severity/associated sxs/prior Treatment) The history is provided by the patient. No language interpreter was used.  Mark French is a 68 y/o M with PMHx of CAD with CABG x 5, HTN, CHF, OA, COPD, DKA, syncope, cardiomyopathy, duodenal ulcer, DM, right sided hemiparesis from previous stroke, hospice for chronic ischemia heart disease presenting to the ED with a fall that occurred this afternoon at approximately 2:00PM. Patient reported that he was standing up and reported that his right leg gave out on him - stated that this happens often and reported that when his leg gave out he hit his head on the wall. Reported that he hit his head on the left side and landed on the right side of his body. Stated that he has been having neck pain to the left side. Reported that he has constant left shoulder pain and has been seen numerous times regarding it and stated that the pain has increased since the fall. Patient reported that he has been experiencing some muscular pain to the left side of the ribs - tender upon palpation. Patient is on blood thinners - Plavix. Patient has history of difficulty walking-primarily uses walker, uses weights, also resorts to a scooter to get around long distances. Denied chest pain, shortness of breath, difficulty breathing before and after the event. Denied LOC, dizziness, headache, nausea, vomiting, diarrhea, photosensitivity, numbness, tingling, weakness, loss of sensation, abdominal pain, back pain. PCP Dr. Loanne Drilling   Past Medical History  Diagnosis Date  . CORONARY ARTERY DISEASE     a. s/p CABG 1998. b.  08/2011: inferior STEMI.  LHC 09/07/11: Severe 3v CAD, LIMA-LAD patent, SVG-RCA chronically occluded, SVG-circumflex chronically  occluded, SVG-diagonal 95%. PCI: Promus DES to the SVG-diagonal.  Procedure c/b transient CHB req CPR and TTVP. c. Myoview 11/2011: scar but no ischemia.   [name] DIABETES MELLITUS, TYPE I   . HYPERTENSION   . 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 HYPERCHOLESTEROLEMIA   . CERVICAL RADICULOPATHY, LEFT   . PERIPHERAL NEUROPATHY   . Chronic systolic heart failure     Echocardiogram 08/09/13:Cavity size normal, EF 40%, inferior hypokinesis, grade 1 diastolic dysfunction, paradoxical septal motion, mild MR, mild LAE, no PFO, PAS 51, moderate pulm HTN  . ED (erectile dysfunction)   . Hypogonadism male   . GI bleed 2012    Multiple Duodenal ulcer   . Obesity   . Legally blind     bilaterally  . Stroke 33    age 59 months; residual right sided weakness  . History of blood transfusion 2012  . Charcot foot due to diabetes mellitus     chronic pain  . Osteoarthritis     right hip and shoulder  . COPD (chronic obstructive pulmonary disease)   . 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 Hiatal hernia   . Chronic stasis dermatitis     Chronic stasis changes.  . CKD (chronic kidney disease), stage III   . Syncope     a. 07/2013: suspected due to hypotension.  . Biventricular cardiac pacemaker in situ     a. [address] [employer] [place] RF model [id number] (serial number [id number])  . Complete heart block     a. s/p pacemaker placement on  08/2013 admission  . Duodenal ulcer    Past Surgical History  Procedure Laterality Date  . Carpal tunnel release Left   . Sigmoidoscopy  03/11/2001  . Venous doppler  01/30/2004  . Tonsillectomy and adenoidectomy      "as a kid"  . Cataract extraction w/ intraocular lens  implant, bilateral    . Coronary angioplasty with stent placement  08/2011    "1"  . Retinopathy surgery Bilateral 2000's    "laser; both eyes"  . Wrist fusion Right 1976  . Anterior fusion cervical spine  2010    "C spine; Dr. Ronnald Ramp"  . Ankle fracture surgery  Left 1976; ?date    LEFT:  fused; removed bulk of hardware  . Heel spur surgery Left ~ 2007    ? left  . Irrigation and debridement knee  02/27/2012    Procedure: IRRIGATION AND DEBRIDEMENT KNEE;  Surgeon: Augustin Schooling, MD;  Location: [facility];  Service: Orthopedics;  Laterality: Right;  irrigation and drainage right knee septic bursitis  . Eye surgery  2008    cataract removal, cautery  . Coronary artery bypass graft  1998    CABG X5  . Fracture surgery      foot broken 1976  . Pacemaker insertion  08/08/2013    Biventricular pacemaker: [place] RF model [id number] (serial number [id number])   Family History  Problem Relation Age of Onset  . Heart disease Mother     Coronary Artery Disease  . Heart disease Father     Coronary Artery Disease  . Heart disease Paternal Grandmother     Coronary Artery Disease  . Colon cancer Neg Hx   . Diabetes Maternal Grandmother   . Cancer Maternal Grandmother     male cancer ? type  . Diabetes Brother    History  Substance Use Topics  . Smoking status: Never Smoker   . Smokeless tobacco: Never Used  . Alcohol Use: No    Review of Systems  Eyes: Negative for visual disturbance.  Respiratory: Negative for chest tightness and shortness of breath.   Cardiovascular: Negative for chest pain.  Gastrointestinal: Negative for nausea, vomiting and abdominal pain.  Musculoskeletal: Positive for arthralgias (left shoulder) and neck pain.  Neurological: Negative for dizziness, weakness, numbness and headaches.      Allergies  Sildenafil; Percocet; and Contrast media  Home Medications   Prior to Admission medications   Medication Sig Start Date End Date Taking? Authorizing Provider  AMBULATORY NON FORMULARY MEDICATION Pt on O2 - 2 liter   Yes Historical Provider, MD  aspirin EC 81 MG tablet Take 81 mg by mouth at bedtime.   Yes Historical Provider, MD  atorvastatin (LIPITOR) 40 MG tablet Take 40 mg by mouth daily.   Yes  Historical Provider, MD  Ca Carbonate-Mag Hydroxide (ROLAIDS EXTRA STRENGTH) 675-135 MG CHEW Chew 1 tablet by mouth every 12 (twelve) hours as needed (indigestion).   Yes Historical Provider, MD  calcitRIOL (ROCALTROL) 0.25 MCG capsule Take 1 capsule (0.25 mcg total) by mouth daily. 08/22/13  Yes [name], MD  carvedilol (COREG) 3.125 MG tablet Take 3.125 mg by mouth 2 (two) times daily with a meal.   Yes Historical Provider, MD  clopidogrel (PLAVIX) 75 MG tablet Take 1 tablet (75 mg total) by mouth daily with breakfast. 06/04/14  Yes Sherren Mocha, MD  co-enzyme Q-10 30 MG capsule Take 30 mg by mouth daily at 2 PM daily at 2 PM.  Yes Historical Provider, MD  docusate sodium (COLACE) 100 MG capsule Take 300 mg by mouth daily.    Yes Historical Provider, MD  famotidine (PEPCID) 20 MG tablet Take 20 mg by mouth daily.   Yes Historical Provider, MD  furosemide (LASIX) 40 MG tablet Take 80 mg by mouth 2 (two) times daily.   Yes Historical Provider, MD  gabapentin (NEURONTIN) 400 MG capsule Take 2 capsules (800 mg total) by mouth 3 (three) times daily. 10/30/13  Yes Debbe Odea, MD  HYDROcodone-acetaminophen (NORCO) 10-325 MG per tablet Take 1-2 tablets by mouth every 4 (four) hours as needed for moderate pain. 10/13/13  Yes [name], MD  hyoscyamine (LEVSIN SL) 0.125 MG SL tablet Place 0.125 mg under the tongue every 4 (four) hours as needed for cramping (cramping).    Yes Historical Provider, MD  insulin NPH Human (HUMULIN N,NOVOLIN N) 100 UNIT/ML injection Inject 60 Units into the skin daily before breakfast.    Yes Historical Provider, MD  isosorbide mononitrate (IMDUR) 30 MG 24 hr tablet Take 30 mg by mouth daily.   Yes Historical Provider, MD  LORazepam (ATIVAN) 0.5 MG tablet Take 0.5 mg by mouth every 6 (six) hours as needed for anxiety (anxiety).    Yes Historical Provider, MD  losartan (COZAAR) 50 MG tablet Take 0.5 tablets (25 mg total) by mouth daily. 08/20/13  Yes Brittainy Erie Noe,  PA-C  methadone (DOLOPHINE) 5 MG tablet Take 5 mg by mouth 2 (two) times daily.   Yes Historical Provider, MD  Morphine Sulfate (MORPHINE CONCENTRATE) 10 mg / 0.5 ml concentrated solution Place 0.25-0.5 mLs (5-10 mg total) under the tongue every 2 (two) hours as needed for moderate pain, severe pain, anxiety or shortness of breath. 10/30/13  Yes Debbe Odea, MD  OVER THE COUNTER MEDICATION Place 1 drop into both eyes daily as needed (dry eyes). [facility] eye drops   Yes Historical Provider, MD  pantoprazole (PROTONIX) 40 MG tablet Take 1 tablet (40 mg total) by mouth 2 (two) times daily. 02/24/14  Yes Lafayette Dragon, MD  polyethylene glycol powder (GLYCOLAX/MIRALAX) powder Take 9 grams (1/2 capful) dissolved in at least 8 ounces water/juice three times weekly. 05/19/14  Yes Lafayette Dragon, MD  potassium chloride SA (K-DUR,KLOR-CON) 20 MEQ tablet Take 1 tablet (20 mEq total) by mouth 2 (two) times daily. 11/12/13  Yes Sherren Mocha, MD  sennosides-docusate sodium (SENOKOT-S) 8.6-50 MG tablet Take 2 tablets by mouth daily.   Yes Historical Provider, MD  tamsulosin (FLOMAX) 0.4 MG CAPS capsule Take 1 capsule (0.4 mg total) by mouth daily. 05/12/14  Yes [name], MD  tolnaftate (ANTI-FUNGAL) 1 % powder Apply 1 application topically 3 (three) times a week.   Yes Historical Provider, MD  methadone (DOLOPHINE) 10 MG tablet Take 10 mg by mouth every 8 (eight) hours.    Historical Provider, MD  nitroGLYCERIN (NITROSTAT) 0.4 MG SL tablet Place 1 tablet (0.4 mg total) under the tongue every 5 (five) minutes as needed for chest pain. 10/29/12   Sherren Mocha, MD   BP 106/44 mmHg  Pulse 62  Temp(Src) 98.5 F (36.9 C) (Oral)  Resp 22  SpO2 100% Physical Exam  Constitutional: He is oriented to person, place, and time. He appears well-developed and well-nourished. No distress.  HENT:  Head: Normocephalic and atraumatic.  Right Ear: External ear normal.  Left Ear: External ear normal.  Nose: Nose normal.    Mouth/Throat: Oropharynx is clear and moist. No oropharyngeal exudate.  Negative  facial trauma Negative palpation of hematomas Negative crepitus or depressions palpated to the skull/maxillofacial region Negative septal hematoma Negative damage noted to dentition Negative trismus  Eyes: Conjunctivae and EOM are normal. Pupils are equal, round, and reactive to light. Right eye exhibits no discharge. Left eye exhibits no discharge.  Negative nystagmus Visual fields grossly intact Negative pain upon palpation or crepitus identified the orbital bilaterally Negative signs of entrapment  Neck: Normal range of motion. Neck supple. No tracheal deviation present.  Negative neck stiffness Negative nuchal rigidity Negative cervical lymphadenopathy  Mild discomfort upon palpation   Cardiovascular: Normal rate, regular rhythm and normal heart sounds.  Exam reveals no friction rub.   No murmur heard. Pulses:      Radial pulses are 2+ on the right side, and 2+ on the left side.       Dorsalis pedis pulses are 2+ on the right side, and 2+ on the left side.  Cap refill < 3 seconds  Pulmonary/Chest: Effort normal and breath sounds normal. No respiratory distress. He has no wheezes. He has no rales.   He exhibits tenderness.  Negative ecchymosis Negative pain upon palpation to the chest wall Patient is able to speak in full sentences without difficulty Negative use of accessory muscles Negative stridor  Discomfort upon palpation to the posterior aspect of the left ribs - negative ecchymosis. Negative crepitus upon palpation.   Musculoskeletal: Normal range of motion. He exhibits no tenderness.  Full ROM to upper and lower extremities without difficulty noted, negative ataxia noted.  Lymphadenopathy:    He has no cervical adenopathy.  Neurological: He is alert and oriented to person, place, and time. No cranial nerve deficit. He exhibits normal muscle tone. Coordination normal.  Cranial nerves  III-XII grossly intact Strength 5+/5+ to upper and lower extremities bilaterally with resistance applied, equal distribution noted Equal grip strength Sensation intact with differentiation sharp and dull touch Negative facial drooping Negative slurred speech Negative aphasia Negative arm drift Fine motor skills intact Patient is able to bring finger to nose bilaterally  Patient follows commands well   Skin: Skin is warm and dry. No rash noted. He is not diaphoretic. No erythema.  Psychiatric: He has a normal mood and affect. His behavior is normal. Thought content normal.  Nursing note and vitals reviewed.   ED Course  Procedures (including critical care time) Labs Review Labs Reviewed  CBC WITH DIFFERENTIAL - Abnormal; Notable for the following:    Hemoglobin 12.3 (*)    MCH 25.7 (*)    RDW 15.6 (*)    All other components within normal limits  COMPREHENSIVE METABOLIC PANEL - Abnormal; Notable for the following:    Potassium 3.6 (*)    Creatinine, Ser 1.59 (*)    Alkaline Phosphatase 118 (*)    Total Bilirubin 0.2 (*)    GFR calc non Af Amer 42 (*)    GFR calc Af Amer 49 (*)    All other components within normal limits  URINALYSIS, ROUTINE W REFLEX MICROSCOPIC - Abnormal; Notable for the following:    APPearance CLOUDY (*)    All other components within normal limits    Imaging Review Dg Ribs Unilateral W/chest Left  06/09/2014   CLINICAL DATA:  Fall from wheelchair.  Left lower axillary rib pain.  EXAM: LEFT RIBS AND CHEST - 3+ VIEW  COMPARISON:  10/25/2013  FINDINGS: Left cardiac pacemaker. Heart size is upper limits of normal. Surgical plate in the lower cervical spine. Negative for a  pneumothorax. Lung markings are mildly prominent without frank pulmonary edema. Evidence for old fractures involving the left anterior third, fourth and fifth ribs. A BB was placed in the left lower chest region. There is no evidence for a new displaced limb fracture. However, there is a  subtle lucency involving the lateral left ninth rib. Mild blunting at the right costophrenic angle and cannot exclude a small right pleural effusion.  IMPRESSION: No evidence for a new displaced left rib fracture. Question a nondisplaced fracture involving the left ninth rib.  Old left rib fractures as described.  Question small right pleural effusion.   Electronically Signed   By: Markus Daft M.D.   On: 06/09/2014 18:20   Ct Head Wo Contrast  06/09/2014   CLINICAL DATA:  68 year old male with fall hitting left side hilum wall 1 hr ago. History of left cervical radiculopathy. Initial encounter.  EXAM: CT HEAD WITHOUT CONTRAST  CT CERVICAL SPINE WITHOUT CONTRAST  TECHNIQUE: Multidetector CT imaging of the head and cervical spine was performed following the standard protocol without intravenous contrast. Multiplanar CT image reconstructions of the cervical spine were also generated.  COMPARISON:  05/21/2014.  FINDINGS: CT HEAD FINDINGS  No skull fracture or intracranial hemorrhage.  Minimal dural thickening unchanged.  Remote left frontal lobe infarct and small vessel disease type changes without CT evidence of large acute infarct.  Mild global age related atrophy without hydrocephalus.  No intracranial mass lesion noted on this unenhanced exam.  Vascular calcifications.  CT CERVICAL SPINE FINDINGS  No cervical spine fracture noted.  Remote fusion C3 through C5 with loosening of C5 screws and nonunion across the C4-5 disc space level.  Mild kyphosis C5-6 level seen on some of the prior exams.  C5-6 bulge with spinal stenosis and mild cord flattening.  C2-3 bulge and posterior ligament hypertrophy with moderate spinal stenosis and cord flattening.  C6-7 broad-based disc osteophyte complex with mild spinal stenosis and minimal cord contact.  No abnormal prevertebral soft tissue swelling.  Left lobe of thyroid lesions as previously discussed.  Carotid bifurcation calcifications.  IMPRESSION: CT HEAD  No skull fracture  or intracranial hemorrhage.  Remote left frontal lobe infarct and small vessel disease type changes without CT evidence of large acute infarct.  CT CERVICAL  No cervical spine fracture noted.  Remote fusion C3 through C5 with loosening of C5 screws and nonunion across the C4-5 disc space level.  Mild kyphosis C5-6 level seen on some of the prior exams. This may be normal for this patient. Patient has a pacemaker and may not be able to have MR. If there is a suspicion of ligamentous injury and patient stable, flexion and extension views can be obtained for further delineation.  C5-6 bulge with spinal stenosis and mild cord flattening.  C2-3 bulge and posterior ligament hypertrophy with moderate spinal stenosis and cord flattening.  C6-7 broad-based disc osteophyte complex with mild spinal stenosis and minimal cord contact.  Left lobe of thyroid lesions as previously discussed.  Carotid bifurcation calcifications.   Electronically Signed   By: Chauncey Cruel M.D.   On: 06/09/2014 16:56   Ct Cervical Spine Wo Contrast  06/09/2014   CLINICAL DATA:  68 year old male with fall hitting left side hilum wall 1 hr ago. History of left cervical radiculopathy. Initial encounter.  EXAM: CT HEAD WITHOUT CONTRAST  CT CERVICAL SPINE WITHOUT CONTRAST  TECHNIQUE: Multidetector CT imaging of the head and cervical spine was performed following the standard protocol without intravenous contrast. Multiplanar  CT image reconstructions of the cervical spine were also generated.  COMPARISON:  05/21/2014.  FINDINGS: CT HEAD FINDINGS  No skull fracture or intracranial hemorrhage.  Minimal dural thickening unchanged.  Remote left frontal lobe infarct and small vessel disease type changes without CT evidence of large acute infarct.  Mild global age related atrophy without hydrocephalus.  No intracranial mass lesion noted on this unenhanced exam.  Vascular calcifications.  CT CERVICAL SPINE FINDINGS  No cervical spine fracture noted.  Remote  fusion C3 through C5 with loosening of C5 screws and nonunion across the C4-5 disc space level.  Mild kyphosis C5-6 level seen on some of the prior exams.  C5-6 bulge with spinal stenosis and mild cord flattening.  C2-3 bulge and posterior ligament hypertrophy with moderate spinal stenosis and cord flattening.  C6-7 broad-based disc osteophyte complex with mild spinal stenosis and minimal cord contact.  No abnormal prevertebral soft tissue swelling.  Left lobe of thyroid lesions as previously discussed.  Carotid bifurcation calcifications.  IMPRESSION: CT HEAD  No skull fracture or intracranial hemorrhage.  Remote left frontal lobe infarct and small vessel disease type changes without CT evidence of large acute infarct.  CT CERVICAL  No cervical spine fracture noted.  Remote fusion C3 through C5 with loosening of C5 screws and nonunion across the C4-5 disc space level.  Mild kyphosis C5-6 level seen on some of the prior exams. This may be normal for this patient. Patient has a pacemaker and may not be able to have MR. If there is a suspicion of ligamentous injury and patient stable, flexion and extension views can be obtained for further delineation.  C5-6 bulge with spinal stenosis and mild cord flattening.  C2-3 bulge and posterior ligament hypertrophy with moderate spinal stenosis and cord flattening.  C6-7 broad-based disc osteophyte complex with mild spinal stenosis and minimal cord contact.  Left lobe of thyroid lesions as previously discussed.  Carotid bifurcation calcifications.   Electronically Signed   By: Chauncey Cruel M.D.   On: 06/09/2014 16:56   Dg Shoulder Left  06/09/2014   CLINICAL DATA:  Chronic left shoulder pain. Recent fall from wheelchair.  EXAM: LEFT SHOULDER - 2+ VIEW  COMPARISON:  07/16/2013  FINDINGS: Left cardiac pacemaker. Left shoulder is located without a fracture. Left clavicle is intact. Surgical plate in the cervical spine. There may be old anterior left rib fractures. No gross  abnormality to the left scapula.  IMPRESSION: No acute bone abnormality in the left shoulder.   Electronically Signed   By: Markus Daft M.D.   On: 06/09/2014 18:10     EKG Interpretation None      9:56 PM This provider spoke with Dr. Kathyrn Sheriff, Neurosurgery. Discussed CT cervical spine imaging in great detail regarding hardware loosening at C5. As per neurosurgeon recommended patient to be followed up with his neurosurgeon regarding this finding. Reported the patient safe for discharge.  MDM   Final diagnoses:  Fall  Closed head injury, initial encounter    Medications - No data to display   Filed Vitals:   06/09/14 1804 06/09/14 1930 06/09/14 2000 06/09/14 2100  BP: 133/49 132/67 139/63 106/44  Pulse: 74 64 61 62  Temp:      TempSrc:      Resp: 19 22  22   SpO2: 98% 99% 100% 100%    CBC unremarkable. CMP unremarkable, mild bump in creatinine from 1.4 to 1.59. UA unremarkable. Plain film of left shoulder negative for acute osseous injury. Plain  film of left chest no evidence of new displaced left rib-question nondisplaced fracture involving the left ninth rib, old left rib fractures noted.CT head no acute fracture or intracranial hemorrhage. No new evidence of large acute infarction. CT cervical spine no cervical spine fracture noted. Patient presenting to the emergency department with a fall striking his head on the wall from a standing position. Patient has history of stroke resulting in right hemiparesis. Unsteady gait has been a chronic issue-patient uses a walker and a scooter to get from one place to another - patient and wife reported that his known issues for falling. Denied headache, dizziness, blurred vision or visual changes. Patient alert and orientated. Negative focal neurological deficits noted. Patient seen and assessed by attending physician, Dr. Madison Hickman who agrees to plan of discharge. Labs unremarkable, imaging negative for acute injury. Discussed CT cervical spine with  neurosurgery who recommended patient to be followed up with neurosurgeon performed surgery. Patient stable, afebrile. Patient not septic appearing. Discharged patient. Discussed with patient to rest and stay hydrated. Discussed with patient to avoid any strenuous activity. Referred patient to primary care provider to be reassessed by the end of this week. Discussed with patient to closely monitor symptoms and if symptoms are to worsen or change to report back to the ED - strict return instructions given.  Patient agreed to plan of care, understood, all questions answered.   Jamse Mead, PA-C 06/09/14 2303

## 2014-06-09 NOTE — ED Provider Notes (Signed)
Medical screening examination/treatment/procedure(s) were conducted as a shared visit with non-physician practitioner(s) and myself.  I personally evaluated the patient during the encounter.   EKG Interpretation None     Patient here after losing his balance and falling prior to arrival. No shortness of breath or chest pain prior to the accident. Patient does take Plavix. Will obtain head CT and neck CT. No recent illnesses. Suspect patient likely to be discharged home  Leota Jacobsen, MD 06/09/14 1806

## 2014-06-09 NOTE — Discharge Instructions (Signed)
Please call your doctor for a followup appointment within 24-48 hours. When you talk to your doctor please let them know that you were seen in the emergency department and have them acquire all of your records so that they can discuss the findings with you and formulate a treatment plan to fully care for your new and ongoing problems. Please call and set-up an appointment with your primary care provider Please call and set up an appointment with your neurosurgeon, Dr. Ronnald Ramp regarding ongoing neck discomfort and loosening of equipment Please rest and stay hydrated Please avoid any physical or strenuous activity  Please continue to monitor symptoms closely and if symptoms are to worsen or change (fever greater than 101, chills, sweating, nausea, vomiting, chest pain, shortness of breathe, difficulty breathing, weakness, numbness, tingling, worsening or changes to pain pattern, fall, injury, loss of sensation, weakness) please report back to the Emergency Department immediately.   Fall Prevention and Home Safety Falls cause injuries and can affect all age groups. It is possible to use preventive measures to significantly decrease the likelihood of falls. There are many simple measures which can make your home safer and prevent falls. OUTDOORS  Repair cracks and edges of walkways and driveways.  Remove high doorway thresholds.  Trim shrubbery on the main path into your home.  Have good outside lighting.  Clear walkways of tools, rocks, debris, and clutter.  Check that handrails are not broken and are securely fastened. Both sides of steps should have handrails.  Have leaves, snow, and ice cleared regularly.  Use sand or salt on walkways during winter months.  In the garage, clean up grease or oil spills. BATHROOM  Install night lights.  Install grab bars by the toilet and in the tub and shower.  Use non-skid mats or decals in the tub or shower.  Place a plastic non-slip stool in the  shower to sit on, if needed.  Keep floors dry and clean up all water on the floor immediately.  Remove soap buildup in the tub or shower on a regular basis.  Secure bath mats with non-slip, double-sided rug tape.  Remove throw rugs and tripping hazards from the floors. BEDROOMS  Install night lights.  Make sure a bedside light is easy to reach.  Do not use oversized bedding.  Keep a telephone by your bedside.  Have a firm chair with side arms to use for getting dressed.  Remove throw rugs and tripping hazards from the floor. KITCHEN  Keep handles on pots and pans turned toward the center of the stove. Use back burners when possible.  Clean up spills quickly and allow time for drying.  Avoid walking on wet floors.  Avoid hot utensils and knives.  Position shelves so they are not too high or low.  Place commonly used objects within easy reach.  If necessary, use a sturdy step stool with a grab bar when reaching.  Keep electrical cables out of the way.  Do not use floor polish or wax that makes floors slippery. If you must use wax, use non-skid floor wax.  Remove throw rugs and tripping hazards from the floor. STAIRWAYS  Never leave objects on stairs.  Place handrails on both sides of stairways and use them. Fix any loose handrails. Make sure handrails on both sides of the stairways are as long as the stairs.  Check carpeting to make sure it is firmly attached along stairs. Make repairs to worn or loose carpet promptly.  Avoid placing throw  rugs at the top or bottom of stairways, or properly secure the rug with carpet tape to prevent slippage. Get rid of throw rugs, if possible.  Have an electrician put in a light switch at the top and bottom of the stairs. OTHER FALL PREVENTION TIPS  Wear low-heel or rubber-soled shoes that are supportive and fit well. Wear closed toe shoes.  When using a stepladder, make sure it is fully opened and both spreaders are firmly  locked. Do not climb a closed stepladder.  Add color or contrast paint or tape to grab bars and handrails in your home. Place contrasting color strips on first and last steps.  Learn and use mobility aids as needed. Install an electrical emergency response system.  Turn on lights to avoid dark areas. Replace light bulbs that burn out immediately. Get light switches that glow.  Arrange furniture to create clear pathways. Keep furniture in the same place.  Firmly attach carpet with non-skid or double-sided tape.  Eliminate uneven floor surfaces.  Select a carpet pattern that does not visually hide the edge of steps.  Be aware of all pets. OTHER HOME SAFETY TIPS  Set the water temperature for 120 F (48.8 C).  Keep emergency numbers on or near the telephone.  Keep smoke detectors on every level of the home and near sleeping areas. Document Released: 07/14/2002 Document Revised: 01/23/2012 Document Reviewed: 10/13/2011 [facility] Patient Information 2015 [place], [place]. This information is not intended to replace advice given to you by your health care provider. Make sure you discuss any questions you have with your health care provider.

## 2014-06-09 NOTE — ED Notes (Addendum)
Pt reports fall and hit head. Pt is on Plavix daily. Last dose was taken this AM. Pt denies Loc. Dr Colin Rhein made aware.

## 2014-06-18 ENCOUNTER — Telehealth: Payer: Self-pay | Admitting: Endocrinology

## 2014-06-18 ENCOUNTER — Telehealth: Payer: Self-pay | Admitting: Cardiovascular Disease

## 2014-06-18 NOTE — Telephone Encounter (Signed)
[name] from hospice called to find out if pt's Pacemaker has a defibrilator with it. Per Hassell Done; pt's pacemaker does not have a defibrilator in it. Abigail Butts verbalized understanding.

## 2014-06-18 NOTE — Telephone Encounter (Signed)
Hospice and [facility]   On humulin insulin 60 units food increase has decreased to half Appetite has gotten worse   [phone] Abigail Butts  Please advise    Thank you

## 2014-06-18 NOTE — Telephone Encounter (Signed)
New problem    Physician want to know if the pacemaker has a defib with it. Please advise

## 2014-06-19 NOTE — Telephone Encounter (Signed)
See note below and please advise, Thanks! 

## 2014-06-19 NOTE — Telephone Encounter (Signed)
Ok, please reduce to 20 units qam Please check cbg at least a few times per week if possible, and let me know

## 2014-06-19 NOTE — Telephone Encounter (Signed)
Pt's wife advised of note below and voiced understanding. Wife will call back on Monday and let us know weekend blood sugar readings.

## 2014-06-22 ENCOUNTER — Telehealth: Payer: Self-pay

## 2014-06-22 NOTE — Telephone Encounter (Signed)
Unable to reach patient ° °Will try again at a later time ° °

## 2014-06-22 NOTE — Telephone Encounter (Signed)
Ok, please reduce to 10 units qam

## 2014-06-22 NOTE — Telephone Encounter (Signed)
Pt's wife called to report blood sugar readings. 11/8: 143  105 after 45 units of insulin 11/9:  106 (no insulin) 11/10:  159 ( no insulin) 11/11:  123 ( no insulin) 11/12 247 60 units of insulin given but blood sugar not retaken  11/13  138 35 units of insulin wife states pt went into insulin shock and blood sugar was not retaken 11/14  146 no insulin  11/15  175 32 units of insulin blood sugar not retaken  Wife states that pt is eating well. Sometimes the pt's stomach hurst but she is keeping an eye out on his readings and adjusting the insulin.  Please advise, Thanks!

## 2014-06-23 ENCOUNTER — Telehealth: Payer: Self-pay | Admitting: Endocrinology

## 2014-06-23 NOTE — Telephone Encounter (Signed)
Abigail Butts from hospice calling to speak with you about new insulin orders for the pt please advise  # [phone]

## 2014-06-23 NOTE — Telephone Encounter (Signed)
Abigail Butts from hospice advised that for right now the pt is doing 10 units of NPH insulin every morning. Wife is going to call back on 06/29/2014 and let us know what the sugars have been.

## 2014-06-23 NOTE — Telephone Encounter (Signed)
Contacted pt's wife and advised of note below.  Pt's wife voiced understanding and states that she will call us back on 11/23 to report blood sugar readings.

## 2014-06-24 ENCOUNTER — Encounter: Payer: Self-pay | Admitting: Cardiology

## 2014-06-25 ENCOUNTER — Telehealth: Payer: Self-pay

## 2014-06-25 NOTE — Telephone Encounter (Signed)
Pt's wife called stating this yesterday she gave the pt 20 units of insulin and this morning his blood sugar was 266. Pt's blood sugar has not been this high in a while and wife wanted to check about increasing the dosage.  Please advise,  Thanks!

## 2014-06-25 NOTE — Telephone Encounter (Signed)
Pt's wife advised of note below and voiced understanding.  

## 2014-06-25 NOTE — Telephone Encounter (Signed)
Ok, please increase to 30 units qam. Please continue to follow the blood sugar, as it looks like pt's insulin needs are variable.

## 2014-07-13 ENCOUNTER — Other Ambulatory Visit: Payer: Self-pay | Admitting: Internal Medicine

## 2014-07-14 ENCOUNTER — Other Ambulatory Visit: Payer: Self-pay | Admitting: Endocrinology

## 2014-07-16 ENCOUNTER — Encounter (HOSPITAL_COMMUNITY): Payer: Self-pay | Admitting: Interventional Cardiology

## 2014-07-27 ENCOUNTER — Encounter: Payer: Self-pay | Admitting: *Deleted

## 2014-07-27 ENCOUNTER — Telehealth: Payer: Self-pay | Admitting: *Deleted

## 2014-07-27 MED ORDER — DICYCLOMINE HCL 10 MG PO CAPS: 10.0000 mg | ORAL_CAPSULE | Freq: Four times a day (QID) | ORAL | Status: DC | PRN

## 2014-07-27 NOTE — Telephone Encounter (Signed)
Dr Olevia Perches- Patient's insurance will no longer cover hyoscyamine. They will cover dicyclomine 10 mg capsules. Please advise.Marland KitchenMarland Kitchen

## 2014-07-27 NOTE — Telephone Encounter (Signed)
Rx sent. Patient advised. 

## 2014-07-27 NOTE — Telephone Encounter (Signed)
Please substitute Bentyl 10 mg, # 60 1 po q 6 hrs prn crampy abd. Pain, 1 refill

## 2014-08-10 ENCOUNTER — Other Ambulatory Visit: Payer: Self-pay | Admitting: Cardiovascular Disease

## 2014-08-16 ENCOUNTER — Other Ambulatory Visit: Payer: Self-pay | Admitting: Cardiovascular Disease

## 2014-08-17 NOTE — Telephone Encounter (Signed)
Is this ok to continuously refill for the patient? I do not see where Dr Burt Knack manages lipids. Please advise. Thanks, MI

## 2014-08-17 NOTE — Telephone Encounter (Signed)
Okay to refill? 

## 2014-08-18 ENCOUNTER — Ambulatory Visit: Admitting: Cardiovascular Disease

## 2014-08-20 ENCOUNTER — Telehealth: Payer: Self-pay

## 2014-08-20 NOTE — Telephone Encounter (Signed)
[facility] nurse called in regards to pt's office visit tomorrow. Nurse wanted MD to be notified the pt's wife has been using a sliding scale so we can address at ov. Sliding scale as follows, Reading 130= 15 units Reading 140= 20 units Reading 150= 25 units  Reading 160= 30 units  Reading 170= 35 units Reading 180= 40 units Reading 190= 45 units.

## 2014-08-20 NOTE — Telephone Encounter (Signed)
ok 

## 2014-08-21 ENCOUNTER — Ambulatory Visit (INDEPENDENT_AMBULATORY_CARE_PROVIDER_SITE_OTHER): Admitting: Endocrinology

## 2014-08-21 ENCOUNTER — Ambulatory Visit
Admission: RE | Admit: 2014-08-21 | Discharge: 2014-08-21 | Disposition: A | Discharge status: Home / Self Care | Payer: Commercial Managed Care - HMO | Source: Ambulatory Visit | Attending: Endocrinology | Admitting: Endocrinology

## 2014-08-21 ENCOUNTER — Encounter: Payer: Self-pay | Admitting: Endocrinology

## 2014-08-21 ENCOUNTER — Other Ambulatory Visit: Payer: Self-pay | Admitting: Endocrinology

## 2014-08-21 VITALS — BP 120/62 | HR 74 | Temp 97.7°F | Wt 197.0 lb

## 2014-08-21 DIAGNOSIS — M25551 Pain in right hip: Secondary | ICD-10-CM

## 2014-08-21 DIAGNOSIS — Z125 Encounter for screening for malignant neoplasm of prostate: Secondary | ICD-10-CM | POA: Diagnosis not present

## 2014-08-21 DIAGNOSIS — N189 Chronic kidney disease, unspecified: Secondary | ICD-10-CM | POA: Diagnosis not present

## 2014-08-21 DIAGNOSIS — D509 Iron deficiency anemia, unspecified: Secondary | ICD-10-CM

## 2014-08-21 DIAGNOSIS — M25559 Pain in unspecified hip: Secondary | ICD-10-CM | POA: Insufficient documentation

## 2014-08-21 DIAGNOSIS — E1022 Type 1 diabetes mellitus with diabetic chronic kidney disease: Secondary | ICD-10-CM

## 2014-08-21 LAB — URINALYSIS, ROUTINE W REFLEX MICROSCOPIC
BILIRUBIN URINE: NEGATIVE
HGB URINE DIPSTICK: NEGATIVE
KETONES UR: NEGATIVE
Leukocytes, UA: NEGATIVE
Nitrite: NEGATIVE
Specific Gravity, Urine: <=1.005 — AB (ref 1.000–1.030)
Total Protein, Urine: NEGATIVE
Urine Glucose: NEGATIVE
Urobilinogen, UA: 0.2 (ref 0.0–1.0)
WBC, UA: NONE SEEN (ref 0–?)
pH: 6 (ref 5.0–8.0)

## 2014-08-21 LAB — CBC WITH DIFFERENTIAL/PLATELET
BASOS ABS: 0 10*3/uL (ref 0.0–0.1)
BASOS PCT: 0.5 % (ref 0.0–3.0)
EOS PCT: 5.2 % — AB (ref 0.0–5.0)
Eosinophils Absolute: 0.3 10*3/uL (ref 0.0–0.7)
HCT: 36.5 % — ABNORMAL LOW (ref 39.0–52.0)
HEMOGLOBIN: 11.4 g/dL — AB (ref 13.0–17.0)
LYMPHS PCT: 20.8 % (ref 12.0–46.0)
Lymphs Abs: 1.3 10*3/uL (ref 0.7–4.0)
MCHC: 31.2 g/dL (ref 30.0–36.0)
MCV: 80.6 fl (ref 78.0–100.0)
Monocytes Absolute: 0.8 10*3/uL (ref 0.1–1.0)
Monocytes Relative: 12.6 % — ABNORMAL HIGH (ref 3.0–12.0)
Neutro Abs: 3.7 10*3/uL (ref 1.4–7.7)
Neutrophils Relative %: 60.9 % (ref 43.0–77.0)
PLATELETS: 206 10*3/uL (ref 150.0–400.0)
RBC: 4.53 Mil/uL (ref 4.22–5.81)
RDW: 16.1 % — ABNORMAL HIGH (ref 11.5–15.5)
WBC: 6.1 10*3/uL (ref 4.0–10.5)

## 2014-08-21 LAB — HEPATIC FUNCTION PANEL
ALT: 9 U/L (ref 0–53)
AST: 13 U/L (ref 0–37)
Albumin: 3.5 g/dL (ref 3.5–5.2)
Alkaline Phosphatase: 83 U/L (ref 39–117)
BILIRUBIN DIRECT: 0.1 mg/dL (ref 0.0–0.3)
Total Bilirubin: 0.3 mg/dL (ref 0.2–1.2)
Total Protein: 6.8 g/dL (ref 6.0–8.3)

## 2014-08-21 LAB — BASIC METABOLIC PANEL
BUN: 24 mg/dL — AB (ref 6–23)
CALCIUM: 8.9 mg/dL (ref 8.4–10.5)
CHLORIDE: 101 meq/L (ref 96–112)
CO2: 31 mEq/L (ref 19–32)
Creatinine, Ser: 1.13 mg/dL (ref 0.40–1.50)
GFR: 68 mL/min (ref >60.00)
Glucose, Bld: 103 mg/dL — ABNORMAL HIGH (ref 70–99)
Potassium: 4.1 mEq/L (ref 3.5–5.1)
Sodium: 138 mEq/L (ref 135–145)

## 2014-08-21 LAB — IBC PANEL
IRON: 30 ug/dL — AB (ref 42–165)
Saturation Ratios: 7.7 % — ABNORMAL LOW (ref 20.0–50.0)
Transferrin: 279 mg/dL (ref 212.0–360.0)

## 2014-08-21 LAB — LIPID PANEL
CHOLESTEROL: 112 mg/dL (ref 0–200)
HDL: 34.9 mg/dL — ABNORMAL LOW (ref >39.00)
LDL Cholesterol: 56 mg/dL (ref 0–99)
NONHDL: 77.1
Total CHOL/HDL Ratio: 3
Triglycerides: 104 mg/dL (ref 0.0–149.0)
VLDL: 20.8 mg/dL (ref 0.0–40.0)

## 2014-08-21 LAB — HEMOGLOBIN A1C: HEMOGLOBIN A1C: 6.7 % — AB (ref 4.6–6.5)

## 2014-08-21 LAB — MICROALBUMIN / CREATININE URINE RATIO
Creatinine,U: 32.7 mg/dL
MICROALB UR: 0 mg/dL (ref 0.0–1.9)
Microalb Creat Ratio: 0 mg/g (ref 0.0–30.0)

## 2014-08-21 LAB — TSH: TSH: 1.51 u[IU]/mL (ref 0.35–4.50)

## 2014-08-21 LAB — PSA, MEDICARE: PSA: 0.21 ng/mL (ref 0.10–4.00)

## 2014-08-21 NOTE — Patient Instructions (Addendum)
Please come back for a "medicare wellness" appointment in 3 months.    check your blood sugar twice a day.  vary the time of day when you check, between before the 3 meals, and at bedtime.  also check if you have symptoms of your blood sugar being too high or too low.  please keep a record of the readings and bring it to your next appointment here.  You can write it on any piece of paper.  please call us sooner if your blood sugar goes below 70, or if you have a lot of readings over 200.  On this type of insulin schedule, you should eat meals on a regular schedule.  If a meal is missed or significantly delayed, your blood sugar could go low.   blood tests and an x-ray are requested for you today.  We'll let you know about the results.   Please reduce the insulin to 10 units each morning.

## 2014-08-21 NOTE — Progress Notes (Signed)
Subjective:    Patient ID: Mark French, male    DOB: 1945/12/30, 69 y.o.   MRN: [id number]  HPI History is from patient and wife.   Pt returns for f/u of diabetes mellitus: DM type: Insulin-requiring type 2 Dx'ed: 2951 Complications: polyneuropathy, CAD, renal insufficiency, and retinopathy Therapy: NPH insulin DKA: never. Severe hypoglycemia: never. Pancreatitis: never. Other: therapy has been limited by pt's need for a simple, inexpensive insulin regimen; he receives hospice care.   Interval history: he brings a record of his cbg's which i have reviewed today.  All are in the low to mid-100's.  There is no trend throughout the day.  Last week, he had an episode of severe hypoglycemia (58--he had decreased consciousness).  Wife was giving him sliding-scale NPH each am, but does not recall how much he got that morning.  Pt states few months of right hip pain.  No assoc rash.  He is unable to cite precip factor such as local injury, although he has had several falls.  Past Medical History  Diagnosis Date  . CORONARY ARTERY DISEASE     a. s/p CABG 1998. b.  08/2011: inferior STEMI.  LHC 09/07/11: Severe 3v CAD, LIMA-LAD patent, SVG-RCA chronically occluded, SVG-circumflex chronically occluded, SVG-diagonal 95%. PCI: Promus DES to the SVG-diagonal.  Procedure c/b transient CHB req CPR and TTVP. c. Myoview 11/2011: scar but no ischemia.   [name] DIABETES MELLITUS, TYPE I   . HYPERTENSION   . 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 HYPERCHOLESTEROLEMIA   . CERVICAL RADICULOPATHY, LEFT   . PERIPHERAL NEUROPATHY   . Chronic systolic heart failure     Echocardiogram 08/09/13:Cavity size normal, EF 40%, inferior hypokinesis, grade 1 diastolic dysfunction, paradoxical septal motion, mild MR, mild LAE, no PFO, PAS 51, moderate pulm HTN  . ED (erectile dysfunction)   . Hypogonadism male   . GI bleed 2012    Multiple Duodenal ulcer   . Obesity   . Legally blind     bilaterally  . Stroke  84    age 39 months; residual right sided weakness  . History of blood transfusion 2012  . Charcot foot due to diabetes mellitus     chronic pain  . Osteoarthritis     right hip and shoulder  . COPD (chronic obstructive pulmonary disease)   . 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 Hiatal hernia   . Chronic stasis dermatitis     Chronic stasis changes.  . CKD (chronic kidney disease), stage III   . Syncope     a. 07/2013: suspected due to hypotension.  . Biventricular cardiac pacemaker in situ     a. [address] [employer] RF model [id number] (serial number [id number])  . Complete heart block     a. s/p pacemaker placement on 08/2013 admission  . Duodenal ulcer     Past Surgical History  Procedure Laterality Date  . Carpal tunnel release Left   . Sigmoidoscopy  03/11/2001  . Venous doppler  01/30/2004  . Tonsillectomy and adenoidectomy      "as a kid"  . Cataract extraction w/ intraocular lens  implant, bilateral    . Coronary angioplasty with stent placement  08/2011    "1"  . Retinopathy surgery Bilateral 2000's    "laser; both eyes"  . Wrist fusion Right 1976  . Anterior fusion cervical spine  2010    "C spine; Dr. Ronnald Ramp"  . Ankle fracture surgery Left 1976; ?date  LEFT:  fused; removed bulk of hardware  . Heel spur surgery Left ~ 2007    ? left  . Irrigation and debridement knee  02/27/2012    Procedure: IRRIGATION AND DEBRIDEMENT KNEE;  Surgeon: Augustin Schooling, MD;  Location: [facility];  Service: Orthopedics;  Laterality: Right;  irrigation and drainage right knee septic bursitis  . Eye surgery  2008    cataract removal, cautery  . Coronary artery bypass graft  1998    CABG X5  . Fracture surgery      foot broken 1976  . Pacemaker insertion  08/08/2013    Biventricular pacemaker: [place] RF model [id number] (serial number [id number])  . Left heart catheterization with coronary/graft angiogram N/A 09/07/2011    Procedure:  LEFT HEART CATHETERIZATION WITH [name];  Surgeon: Jettie Booze, MD;  Location: [facility] CATH LAB;  Service: Cardiovascular;  Laterality: N/A;  . Temporary pacemaker insertion  08/07/2013    Procedure: TEMPORARY PACEMAKER INSERTION;  Surgeon: Leonie Man, MD;  Location: [facility] CATH LAB;  Service: Cardiovascular;;  . Left heart catheterization with coronary/graft angiogram  08/07/2013    Procedure: LEFT HEART CATHETERIZATION WITH [name];  Surgeon: Leonie Man, MD;  Location: [facility] CATH LAB;  Service: Cardiovascular;;  . Bi-ventricular pacemaker insertion N/A 08/08/2013    Procedure: BI-VENTRICULAR PACEMAKER INSERTION (CRT-P);  Surgeon: Coralyn Mark, MD;  Location: [facility] CATH LAB;  Service: Cardiovascular;  Laterality: N/A;  . Left heart catheterization with coronary/graft angiogram N/A 10/27/2013    Procedure: LEFT HEART CATHETERIZATION WITH [name];  Surgeon: Sinclair Grooms, MD;  Location: [facility] CATH LAB;  Service: Cardiovascular;  Laterality: N/A;    History   Social History  . Marital Status: Married    Spouse Name: N/A    Number of Children: 0  . Years of Education: N/A   Occupational History  . Disabled    Social History Main Topics  . Smoking status: Never Smoker   . Smokeless tobacco: Never Used  . Alcohol Use: No  . Drug Use: No  . Sexual Activity: No   Other Topics Concern  . Not on file   Social History Narrative   Comes to appointments with wife      0 Caffeine drinks daily     Current Outpatient Prescriptions on File Prior to Visit  Medication Sig Dispense Refill  . AMBULATORY NON FORMULARY MEDICATION Pt on O2 - 2 liter    . aspirin EC 81 MG tablet Take 81 mg by mouth at bedtime.    [name] atorvastatin (LIPITOR) 40 MG tablet Take 40 mg by mouth daily.    [name] atorvastatin (LIPITOR) 40 MG tablet TAKE 1 TABLET BY MOUTH EVERY DAY 30 tablet 0  . Ca Carbonate-Mag Hydroxide (ROLAIDS EXTRA STRENGTH) 675-135 MG CHEW Chew 1 tablet by  mouth every 12 (twelve) hours as needed (indigestion).    . calcitRIOL (ROCALTROL) 0.25 MCG capsule Take 1 capsule (0.25 mcg total) by mouth daily. 30 capsule 11  . carvedilol (COREG) 3.125 MG tablet Take 3.125 mg by mouth 2 (two) times daily with a meal.    . clopidogrel (PLAVIX) 75 MG tablet Take 1 tablet (75 mg total) by mouth daily with breakfast. 30 tablet 6  . co-enzyme Q-10 30 MG capsule Take 30 mg by mouth daily at 2 PM daily at 2 PM.     . docusate sodium (COLACE) 100 MG capsule Take 300 mg by mouth daily.     [name]  famotidine (PEPCID) 20 MG tablet Take 20 mg by mouth daily.    . furosemide (LASIX) 40 MG tablet Take 80 mg by mouth 2 (two) times daily.    [name] gabapentin (NEURONTIN) 400 MG capsule Take 2 capsules (800 mg total) by mouth 3 (three) times daily. 180 capsule 3  . HUMULIN N 100 UNIT/ML injection INJECT 70 UNITS INTO THE SKIN EVERY DAY BEFORE BREAKFAST (Patient taking differently: 10 units each morning) 20 mL 0  . HYDROcodone-acetaminophen (NORCO) 10-325 MG per tablet Take 1-2 tablets by mouth every 4 (four) hours as needed for moderate pain. 150 tablet 0  . insulin NPH Human (HUMULIN N,NOVOLIN N) 100 UNIT/ML injection Inject 10 Units into the skin every morning.     . isosorbide mononitrate (IMDUR) 30 MG 24 hr tablet TAKE 1 TABLET BY MOUTH EVERY DAY 30 tablet 3  . LORazepam (ATIVAN) 0.5 MG tablet Take 0.5 mg by mouth every 6 (six) hours as needed for anxiety (anxiety).     [name] losartan (COZAAR) 50 MG tablet Take 0.5 tablets (25 mg total) by mouth daily. 30 tablet 5  . methadone (DOLOPHINE) 10 MG tablet Take 10 mg by mouth every 8 (eight) hours.    . methadone (DOLOPHINE) 5 MG tablet Take 5 mg by mouth 2 (two) times daily.    . Morphine Sulfate (MORPHINE CONCENTRATE) 10 mg / 0.5 ml concentrated solution Place 0.25-0.5 mLs (5-10 mg total) under the tongue every 2 (two) hours as needed for moderate pain, severe pain, anxiety or shortness of breath. 15 mL 0  . nitroGLYCERIN (NITROSTAT) 0.4  MG SL tablet Place 1 tablet (0.4 mg total) under the tongue every 5 (five) minutes as needed for chest pain. 25 tablet 5  . OVER THE COUNTER MEDICATION Place 1 drop into both eyes daily as needed (dry eyes). [facility] eye drops    . pantoprazole (PROTONIX) 40 MG tablet TAKE 1 TABLET BY MOUTH TWICE DAILY 60 tablet 2  . polyethylene glycol powder (GLYCOLAX/MIRALAX) powder Take 9 grams (1/2 capful) dissolved in at least 8 ounces water/juice three times weekly. 527 g 1  . potassium chloride SA (K-DUR,KLOR-CON) 20 MEQ tablet TAKE 1 TABLET BY MOUTH TWICE DAILY 60 tablet 3  . sennosides-docusate sodium (SENOKOT-S) 8.6-50 MG tablet Take 2 tablets by mouth daily.    . tamsulosin (FLOMAX) 0.4 MG CAPS capsule Take 1 capsule (0.4 mg total) by mouth daily. 30 capsule 3  . tolnaftate (ANTI-FUNGAL) 1 % powder Apply 1 application topically 3 (three) times a week.    . dicyclomine (BENTYL) 10 MG capsule Take 1 capsule (10 mg total) by mouth every 6 (six) hours as needed for spasms. (Patient not taking: Reported on 08/21/2014) 60 capsule 1   No current facility-administered medications on file prior to visit.    Allergies  Allergen Reactions  . Sildenafil Nausea Only and Other (See Comments)    "took it once; heart went to pieces; never took it again"  . Percocet [Oxycodone-Acetaminophen] Other (See Comments)    Unknown reaction-possible altered mental state-per wife.    . Contrast Media [Iodinated Diagnostic Agents] Nausea Only    Family History  Problem Relation Age of Onset  . Heart disease Mother     Coronary Artery Disease  . Heart disease Father     Coronary Artery Disease  . Heart disease Paternal Grandmother     Coronary Artery Disease  . Colon cancer Neg Hx   . Diabetes Maternal Grandmother   . Cancer Maternal Grandmother  male cancer ? type  . Diabetes Brother     BP 120/62 mmHg  Pulse 74  Temp(Src) 97.7 F (36.5 C) (Oral)  Wt 197 lb (89.359 kg)  SpO2 99%    Review of  Systems Denies weight change and LOC    Objective:   Physical Exam VITAL SIGNS:  See vs page GENERAL: no distress.  Sitting in scooter.   Right hip: nontender.  Ext: 1+ bilat leg edema. the left great toe is absent. There is bilateral onychomycosis. no deformity.  Skin: There is (chronic) hyperpigmentation of the legs. There are multiple abrasions of the anterior tibial areas (where there are several bandages on shallow ulcers).  No drainage from any of these. no ulcer on the feet, but skin is dry. normal temp.  Pulses: dorsalis pedis intact bilat.   Neuro: sensation is intact to touch on the feet, but decreased from normal.    Lab Results  Component Value Date   WBC 6.1 08/21/2014   HGB 11.4* 08/21/2014   HCT 36.5* 08/21/2014   PLT 206.0 08/21/2014   GLUCOSE 103* 08/21/2014   CHOL 112 08/21/2014   TRIG 104.0 08/21/2014   HDL 34.90* 08/21/2014   LDLDIRECT 158.5 07/19/2010   LDLCALC 56 08/21/2014   ALT 9 08/21/2014   AST 13 08/21/2014   NA 138 08/21/2014   K 4.1 08/21/2014   CL 101 08/21/2014   CREATININE 1.13 08/21/2014   BUN 24* 08/21/2014   CO2 31 08/21/2014   TSH 1.51 08/21/2014   PSA 0.21 08/21/2014   INR 0.85 10/25/2013   HGBA1C 6.7* 08/21/2014   MICROALBUR 0.0 08/21/2014   Radiol: right hip: OA     Assessment & Plan:  Anemia: mild exacerbation Hip pain, new, uncertain etiology.  Given poor prognosis, w/u is limited DM: overcontrolled  Patient is advised the following: Patient Instructions  Please come back for a "medicare wellness" appointment in 3 months.    check your blood sugar twice a day.  vary the time of day when you check, between before the 3 meals, and at bedtime.  also check if you have symptoms of your blood sugar being too high or too low.  please keep a record of the readings and bring it to your next appointment here.  You can write it on any piece of paper.  please call us sooner if your blood sugar goes below 70, or if you have a lot of  readings over 200.  On this type of insulin schedule, you should eat meals on a regular schedule.  If a meal is missed or significantly delayed, your blood sugar could go low.   blood tests and an x-ray are requested for you today.  We'll let you know about the results.   Please reduce the insulin to 10 units each morning.   take 1 iron pill per day,

## 2014-08-24 ENCOUNTER — Telehealth: Payer: Self-pay | Admitting: Internal Medicine

## 2014-08-24 NOTE — Telephone Encounter (Signed)
Linzess 145 ug 1 po qd, #30, in place of Miralax., 1 refill, Librax #30 1 po qam for crampy abd .pain, 1 refill

## 2014-08-24 NOTE — Telephone Encounter (Signed)
Spoke with patient and husband. He is still having lower abdominal pain. He is using Hyoscyamine with relief. He is also using Miralax 1 capful daily with good results. The Hyoscyamine works but is expensive.He is willing to pay if nothing else is available to try. Dicyclomine did not work.

## 2014-08-25 ENCOUNTER — Other Ambulatory Visit: Payer: Self-pay | Admitting: *Deleted

## 2014-08-25 ENCOUNTER — Other Ambulatory Visit (INDEPENDENT_AMBULATORY_CARE_PROVIDER_SITE_OTHER): Payer: Commercial Managed Care - HMO

## 2014-08-25 ENCOUNTER — Other Ambulatory Visit: Payer: Self-pay

## 2014-08-25 ENCOUNTER — Encounter: Payer: Self-pay | Admitting: *Deleted

## 2014-08-25 ENCOUNTER — Other Ambulatory Visit: Payer: Medicare Other

## 2014-08-25 ENCOUNTER — Telehealth: Payer: Self-pay | Admitting: *Deleted

## 2014-08-25 DIAGNOSIS — I5022 Chronic systolic (congestive) heart failure: Secondary | ICD-10-CM

## 2014-08-25 LAB — BASIC METABOLIC PANEL
BUN: 19 mg/dL (ref 6–23)
CHLORIDE: 102 meq/L (ref 96–112)
CO2: 30 mEq/L (ref 19–32)
Calcium: 8.8 mg/dL (ref 8.4–10.5)
Creatinine, Ser: 1.21 mg/dL (ref 0.40–1.50)
GFR: 62.84 mL/min (ref >60.00)
Glucose, Bld: 117 mg/dL — ABNORMAL HIGH (ref 70–99)
POTASSIUM: 3.9 meq/L (ref 3.5–5.1)
Sodium: 137 mEq/L (ref 135–145)

## 2014-08-25 LAB — BRAIN NATRIURETIC PEPTIDE: Pro B Natriuretic peptide (BNP): 179 pg/mL — ABNORMAL HIGH (ref 0.0–100.0)

## 2014-08-25 MED ORDER — CILIDINIUM-CHLORDIAZEPOXIDE 2.5-5 MG PO CAPS: ORAL_CAPSULE | ORAL | Status: DC

## 2014-08-25 MED ORDER — LINACLOTIDE 145 MCG PO CAPS: ORAL_CAPSULE | ORAL | Status: DC

## 2014-08-25 NOTE — Telephone Encounter (Signed)
Restart Hyocyamine.

## 2014-08-25 NOTE — Telephone Encounter (Signed)
Spoke to patient's wife and gave her recommendations. Rx's sent to pharmacy.

## 2014-08-25 NOTE — Telephone Encounter (Signed)
[facility] sent a fax requesting a prior authorization for Librax and Linzess. They gave our office the information for [facility] insurance plan. We contacted them at [phone]. We were told [facility] would not cover either of these prescriptions because neither of these medications are related to the diagnosis he was sent to Hospice with. We contacted [facility] with this information and were told they were not sure why either of these were sent over to [name] as both of these medications went through secondary insurance. Pt apparently has a high deductible prior to being covered to medication. Linzess will cost $46.00 per month. Unfortunately, Librax will cost $1,000.00 monthly even with insurance. It appears from the last phone note, that patient was willing to pay out of pocket for Hyoscyamine (Dycyclomine was previously ineffective). Please advise. Would you like him to re-start Hyoscyamine?

## 2014-08-26 MED ORDER — HYOSCYAMINE SULFATE 0.125 MG SL SUBL: 0.1250 mg | SUBLINGUAL_TABLET | SUBLINGUAL | Status: DC | PRN

## 2014-08-26 NOTE — Telephone Encounter (Signed)
Spoke with patient's wife, Mrs. Wendell Nicoson, about restarting Hyoscyamine for patient. New Rx was sent for Hyoscyamine. Pt understood.

## 2014-08-27 ENCOUNTER — Telehealth: Payer: Self-pay | Admitting: Endocrinology

## 2014-08-27 NOTE — Telephone Encounter (Signed)
Mark French from hospice calling regarding pt and his insulin dosage.  Please call wendy at [phone] regarding any changes to his insulin

## 2014-08-31 ENCOUNTER — Encounter: Payer: Self-pay | Admitting: *Deleted

## 2014-08-31 NOTE — Telephone Encounter (Signed)
Yes, please take just 10 units qd

## 2014-08-31 NOTE — Telephone Encounter (Signed)
See below. Current medication list states insulin was reduced from 70 units to 10 units per day.  Is this correct? Thanks!

## 2014-08-31 NOTE — Telephone Encounter (Signed)
Hospice nurse Abigail Butts advised of note below and voiced understanding.

## 2014-09-01 ENCOUNTER — Other Ambulatory Visit: Payer: Self-pay | Admitting: Endocrinology

## 2014-09-01 ENCOUNTER — Encounter: Payer: Self-pay | Admitting: Cardiovascular Disease

## 2014-09-01 ENCOUNTER — Ambulatory Visit (INDEPENDENT_AMBULATORY_CARE_PROVIDER_SITE_OTHER): Payer: Medicare Other | Admitting: Cardiovascular Disease

## 2014-09-01 ENCOUNTER — Telehealth: Payer: Self-pay | Admitting: Endocrinology

## 2014-09-01 VITALS — BP 122/68 | HR 71 | Ht 61.0 in | Wt 192.0 lb

## 2014-09-01 DIAGNOSIS — I5022 Chronic systolic (congestive) heart failure: Secondary | ICD-10-CM | POA: Diagnosis not present

## 2014-09-01 NOTE — Telephone Encounter (Signed)
Hospice nurse asking what the pt is supposed to be taking for iron.  Also what are the results of the xray  Abigail Butts # [phone]

## 2014-09-01 NOTE — Telephone Encounter (Signed)
Please advise if ok to refill. Medication is listed under historical provider.  Thanks!

## 2014-09-01 NOTE — Progress Notes (Signed)
Cardiology Office Note   Date:  09/01/2014   ID:  Mark French, DOB [birth year], MRN [id number]  PCP:  Mark Shin, MD  Cardiologist:  Mark Mocha, MD    Chief Complaint  Patient presents with  . Follow-up     History of Present Illness: Mark French is a 69 y.o. male who presents for follow-up evaluation. He has a history of chronic systolic heart failure with an ejection fraction of 40%, diabetes, heart block status post permanent pacemaker, and recurrent non-ST elevation myocardial infarctions. At the time of his last hospitalization in March 2015, he was discharged with home hospice.  The patient remains remarkably stable. Hospice has really helped him. The Hospice RN comes in once per week. He hasn't been hospitalized in 10 months. He complains of shortness of breath with activity, but no change in severity. He remains weak and unstable, with a mechanical fall this morning. He has not had frank syncope.Leg swelling has been better than in the past. No orthopnea, PND, or chest pain. No resting dyspnea.   Past Medical History  Diagnosis Date  . CORONARY ARTERY DISEASE     a. s/p CABG 1998. b.  08/2011: inferior STEMI.  LHC 09/07/11: Severe 3v CAD, LIMA-LAD patent, SVG-RCA chronically occluded, SVG-circumflex chronically occluded, SVG-diagonal 95%. PCI: Promus DES to the SVG-diagonal.  Procedure c/b transient CHB req CPR and TTVP. c. Myoview 11/2011: scar but no ischemia.   [name] DIABETES MELLITUS, TYPE I   . HYPERTENSION   . 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 HYPERCHOLESTEROLEMIA   . CERVICAL RADICULOPATHY, LEFT   . PERIPHERAL NEUROPATHY   . Chronic systolic heart failure     Echocardiogram 08/09/13:Cavity size normal, EF 40%, inferior hypokinesis, grade 1 diastolic dysfunction, paradoxical septal motion, mild MR, mild LAE, no PFO, PAS 51, moderate pulm HTN  . ED (erectile dysfunction)   . Hypogonadism male   . GI bleed 2012    Multiple Duodenal ulcer   . Obesity    . Legally blind     bilaterally  . Stroke 41    age 14 months; residual right sided weakness  . History of blood transfusion 2012  . Charcot foot due to diabetes mellitus     chronic pain  . Osteoarthritis     right hip and shoulder  . COPD (chronic obstructive pulmonary disease)   . 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 Hiatal hernia   . Chronic stasis dermatitis     Chronic stasis changes.  . CKD (chronic kidney disease), stage III   . Syncope     a. 07/2013: suspected due to hypotension.  . Biventricular cardiac pacemaker in situ     a. [address] [employer] [place] RF model [id number] (serial number [id number])  . Complete heart block     a. s/p pacemaker placement on 08/2013 admission  . Duodenal ulcer     Past Surgical History  Procedure Laterality Date  . Carpal tunnel release Left   . Sigmoidoscopy  03/11/2001  . Venous doppler  01/30/2004  . Tonsillectomy and adenoidectomy      "as a kid"  . Cataract extraction w/ intraocular lens  implant, bilateral    . Coronary angioplasty with stent placement  08/2011    "1"  . Retinopathy surgery Bilateral 2000's    "laser; both eyes"  . Wrist fusion Right 1976  . Anterior fusion cervical spine  2010    "C spine; Dr. Ronnald Ramp"  . Ankle  fracture surgery Left 1976; ?date    LEFT:  fused; removed bulk of hardware  . Heel spur surgery Left ~ 2007    ? left  . Irrigation and debridement knee  02/27/2012    Procedure: IRRIGATION AND DEBRIDEMENT KNEE;  Surgeon: Augustin Schooling, MD;  Location: [facility];  Service: Orthopedics;  Laterality: Right;  irrigation and drainage right knee septic bursitis  . Eye surgery  2008    cataract removal, cautery  . Coronary artery bypass graft  1998    CABG X5  . Fracture surgery      foot broken 1976  . Pacemaker insertion  08/08/2013    Biventricular pacemaker: [employer] RF model [id number] (serial number [id number])  . Left heart catheterization with  coronary/graft angiogram N/A 09/07/2011    Procedure: LEFT HEART CATHETERIZATION WITH [name];  Surgeon: Jettie Booze, MD;  Location[facility] CATH LAB;  Service: Cardiovascular;  Laterality: N/A;  . Temporary pacemaker insertion  08/07/2013    Procedure: TEMPORARY PACEMAKER INSERTION;  Surgeon: Leonie Man, MD;  Location: [facility] CATH LAB;  Service: Cardiovascular;;  . Left heart catheterization with coronary/graft angiogram  08/07/2013    Procedure: LEFT HEART CATHETERIZATION WITH [name];  Surgeon: Leonie Man, MD;  Location: [facility] CATH LAB;  Service: Cardiovascular;;  . Bi-ventricular pacemaker insertion N/A 08/08/2013    Procedure: BI-VENTRICULAR PACEMAKER INSERTION (CRT-P);  Surgeon: Coralyn Mark, MD;  Location[facility] CATH LAB;  Service: Cardiovascular;  Laterality: N/A;  . Left heart catheterization with coronary/graft angiogram N/A 10/27/2013    Procedure: LEFT HEART CATHETERIZATION WITH [name];  Surgeon: Sinclair Grooms, MD;  Location: [facility] CATH LAB;  Service: Cardiovascular;  Laterality: N/A;    Current Outpatient Prescriptions  Medication Sig Dispense Refill  . AMBULATORY NON FORMULARY MEDICATION Pt on O2 - 2 liter    . aspirin EC 81 MG tablet Take 81 mg by mouth at bedtime.    [name] atorvastatin (LIPITOR) 40 MG tablet TAKE 1 TABLET BY MOUTH EVERY DAY 30 tablet 0  . calcitRIOL (ROCALTROL) 0.25 MCG capsule TAKE ONE CAPSULE BY MOUTH DAILY 30 capsule 0  . carvedilol (COREG) 3.125 MG tablet Take 3.125 mg by mouth 2 (two) times daily with a meal.    . clidinium-chlordiazePOXIDE (LIBRAX) 5-2.5 MG per capsule Take one po in AM 30 capsule 1  . clopidogrel (PLAVIX) 75 MG tablet Take 1 tablet (75 mg total) by mouth daily with breakfast. 30 tablet 6  . co-enzyme Q-10 30 MG capsule Take 30 mg by mouth daily at 2 PM daily at 2 PM.     . dicyclomine (BENTYL) 10 MG capsule   1  . docusate sodium (COLACE) 100 MG capsule Take 300 mg by mouth daily.     .  famotidine (PEPCID) 20 MG tablet Take 20 mg by mouth daily.    . furosemide (LASIX) 40 MG tablet Take 80 mg by mouth 2 (two) times daily.    [name] gabapentin (NEURONTIN) 400 MG capsule Take 2 capsules (800 mg total) by mouth 3 (three) times daily. 180 capsule 3  . HUMULIN N 100 UNIT/ML injection INJECT 70 UNITS INTO THE SKIN EVERY DAY BEFORE BREAKFAST (Patient taking differently: 10 units each morning) 20 mL 0  . HYDROcodone-acetaminophen (NORCO) 10-325 MG per tablet Take 1-2 tablets by mouth every 4 (four) hours as needed for moderate pain. 150 tablet 0  . hyoscyamine (LEVSIN SL) 0.125 MG SL tablet Place 1 tablet (0.125  mg total) under the tongue every 4 (four) hours as needed. 120 tablet 2  . insulin NPH Human (HUMULIN N,NOVOLIN N) 100 UNIT/ML injection Inject 10 Units into the skin every morning.     . isosorbide mononitrate (IMDUR) 30 MG 24 hr tablet TAKE 1 TABLET BY MOUTH EVERY DAY 30 tablet 3  . Linaclotide (LINZESS) 145 MCG CAPS capsule Take one po 30 minutes prior to breakfast 30 capsule 1  . LORazepam (ATIVAN) 0.5 MG tablet Take 0.5 mg by mouth every 6 (six) hours as needed for anxiety (anxiety).     [name] losartan (COZAAR) 50 MG tablet Take 0.5 tablets (25 mg total) by mouth daily. 30 tablet 5  . methadone (DOLOPHINE) 10 MG tablet Take 10 mg by mouth every 8 (eight) hours.    . methadone (DOLOPHINE) 5 MG tablet Take 5 mg by mouth 2 (two) times daily.    . Morphine Sulfate (MORPHINE CONCENTRATE) 10 mg / 0.5 ml concentrated solution Place 0.25-0.5 mLs (5-10 mg total) under the tongue every 2 (two) hours as needed for moderate pain, severe pain, anxiety or shortness of breath. 15 mL 0  . nitroGLYCERIN (NITROSTAT) 0.4 MG SL tablet Place 1 tablet (0.4 mg total) under the tongue every 5 (five) minutes as needed for chest pain. 25 tablet 5  . OVER THE COUNTER MEDICATION Place 1 drop into both eyes daily as needed (dry eyes). [facility] eye drops    . pantoprazole (PROTONIX) 40 MG tablet TAKE 1 TABLET BY  MOUTH TWICE DAILY 60 tablet 2  . polyethylene glycol powder (GLYCOLAX/MIRALAX) powder Take 0.5 Containers by mouth daily.   1  . potassium chloride SA (K-DUR,KLOR-CON) 20 MEQ tablet TAKE 1 TABLET BY MOUTH TWICE DAILY 60 tablet 3  . sennosides-docusate sodium (SENOKOT-S) 8.6-50 MG tablet Take 2 tablets by mouth daily.    . tamsulosin (FLOMAX) 0.4 MG CAPS capsule Take 1 capsule (0.4 mg total) by mouth daily. 30 capsule 3  . tolnaftate (ANTI-FUNGAL) 1 % powder Apply 1 application topically 3 (three) times a week.     No current facility-administered medications for this visit.    Allergies:   Sildenafil; Percocet; and Contrast media   Social History:  The patient  reports that he has never smoked. He has never used smokeless tobacco. He reports that he does not drink alcohol or use illicit drugs.   Family History:  The patient's family history includes CAD in his father, mother, and paternal grandmother; Cancer in his maternal grandmother; Diabetes in his brother and maternal grandmother. There is no history of Colon cancer.    ROS:  Please see the history of present illness.  Otherwise, review of systems is positive for abdominal pain, back pain, muscle pains, easy bruising, leg pain, constipation, gait instability.  All other systems are reviewed and negative.    PHYSICAL EXAM: VS:  BP 122/68 mmHg  Pulse 71  Ht 5\' 1"  (1.549 m)  Wt 192 lb (87.091 kg)  BMI 36.30 kg/m2  SpO2 95% , BMI Body mass index is 36.3 kg/(m^2). GEN: Chronically ill-appearing elderly gentleman, obese, in no acute distress HEENT: normal Neck: no JVD, carotid bruits, or masses Cardiac: RRR; no murmurs, rubs, or gallops, 1+ pretibial edema Respiratory:  clear to auscultation bilaterally, normal work of breathing GI: soft, nontender, nondistended, obese Skin: warm and dry, right knee abrasion Neuro:  Strength and sensation are intact Psych: euthymic mood, full affect  EKG:  EKG is not ordered today.  Recent  Labs: 08/21/2014: ALT  9; Hemoglobin 11.4*; Platelets 206.0; TSH 1.51 08/25/2014: BUN 19; Creatinine 1.21; Potassium 3.9; Pro B Natriuretic peptide (BNP) 179.0*; Sodium 137   Lipid Panel     Component Value Date/Time   CHOL 112 08/21/2014 0919   TRIG 104.0 08/21/2014 0919   TRIG 180* 06/21/2006 0735   HDL 34.90* 08/21/2014 0919   CHOLHDL 3 08/21/2014 0919   VLDL 20.8 08/21/2014 0919   LDLCALC 56 08/21/2014 0919   LDLDIRECT 158.5 07/19/2010 1626   LDLDIRECT 109.9 05/16/2006 0736      Wt Readings from Last 3 Encounters:  09/01/14 192 lb (87.091 kg)  08/21/14 197 lb (89.359 kg)  05/21/14 205 lb (92.987 kg)   ASSESSMENT AND PLAN: 1.  Chronic systolic heart failure, NYHA Functional Class 3 2. CAD s/p CABG, no angina at present.  3. Longstanding diabetes with complications 4. CKD Stage III  Overall the patient is stable with his end-stage cardiac conditions. Hospice has been extremely beneficial for the patient and his wife and I greatly appreciate their excellent care. I did not make any medication changes today, but did carefully review his medical program.   Current medicines are reviewed with the patient today.  The patient does not have concerns regarding medicines.  The following changes have been made:  no change  Labs/ tests ordered today include: no orders  No orders of the defined types were placed in this encounter.    Disposition:   FU with me in 4 months  Signed, Mark Mocha, MD  09/01/2014 4:00 PM    [address], [place], [place]  [address] Phone: [phone]; Fax: [phone]

## 2014-09-01 NOTE — Patient Instructions (Addendum)
Your physician recommends that you schedule a follow-up appointment in: 4 MONTHS with Dr Burt Knack  Your physician recommends that you continue on your current medications as directed. Please refer to the Current Medication list given to you today.  Please schedule appointment with Dr Rayann Heman per reminder in system.

## 2014-09-02 NOTE — Telephone Encounter (Signed)
X-ray showed arthritis and athersclerosis. Last labs showed that iron was low.  Pt should take 1-2 non-prescription fe tabs per day.

## 2014-09-02 NOTE — Telephone Encounter (Signed)
Pt's wife advised of note below and voiced understanding.  

## 2014-09-02 NOTE — Telephone Encounter (Signed)
Coulld you review X-ray from 08/21/2014 . Thanks!

## 2014-09-08 ENCOUNTER — Other Ambulatory Visit: Payer: Self-pay | Admitting: Cardiology

## 2014-09-08 ENCOUNTER — Other Ambulatory Visit: Payer: Self-pay | Admitting: Cardiovascular Disease

## 2014-09-11 ENCOUNTER — Telehealth: Payer: Self-pay | Admitting: Cardiology

## 2014-09-11 NOTE — Telephone Encounter (Signed)
-----   Message from Thompson Grayer, MD sent at 06/21/2014  3:13 PM EST ----- Regarding: patient Please enroll in ICM device clinic coreview recently elevated

## 2014-09-11 NOTE — Telephone Encounter (Signed)
Pt does not want to enroll in the ICM clinic at this time. Pt would like to discuss this further with MD at his 09-30-14 appointment.

## 2014-09-18 ENCOUNTER — Other Ambulatory Visit: Payer: Self-pay

## 2014-09-18 ENCOUNTER — Telehealth: Payer: Self-pay | Admitting: Endocrinology

## 2014-09-18 MED ORDER — TAMSULOSIN HCL 0.4 MG PO CAPS: 0.4000 mg | ORAL_CAPSULE | Freq: Every day | ORAL | Status: DC

## 2014-09-18 MED ORDER — ATORVASTATIN CALCIUM 40 MG PO TABS: 40.0000 mg | ORAL_TABLET | Freq: Every day | ORAL | Status: DC

## 2014-09-18 NOTE — Telephone Encounter (Signed)
Patient states that his Tamsulosin needs an authorization  They called 2 days ago and pharmacy still does not have   Sent for Baxter Flattery    Please advise   Thank you

## 2014-09-18 NOTE — Telephone Encounter (Signed)
Pt's wife advised rx for flomax has been sent to [facility]. She states she will contact pharmacy to verify.

## 2014-09-27 ENCOUNTER — Other Ambulatory Visit: Payer: Self-pay | Admitting: Endocrinology

## 2014-09-30 ENCOUNTER — Encounter: Payer: Self-pay | Admitting: Internal Medicine

## 2014-09-30 ENCOUNTER — Ambulatory Visit (INDEPENDENT_AMBULATORY_CARE_PROVIDER_SITE_OTHER): Admitting: Internal Medicine

## 2014-09-30 VITALS — BP 110/58 | HR 63 | Ht 61.0 in

## 2014-09-30 DIAGNOSIS — I110 Hypertensive heart disease with heart failure: Secondary | ICD-10-CM

## 2014-09-30 DIAGNOSIS — I509 Heart failure, unspecified: Secondary | ICD-10-CM

## 2014-09-30 DIAGNOSIS — I5022 Chronic systolic (congestive) heart failure: Secondary | ICD-10-CM

## 2014-09-30 DIAGNOSIS — I441 Atrioventricular block, second degree: Secondary | ICD-10-CM

## 2014-09-30 DIAGNOSIS — I442 Atrioventricular block, complete: Secondary | ICD-10-CM

## 2014-09-30 DIAGNOSIS — I119 Hypertensive heart disease without heart failure: Secondary | ICD-10-CM | POA: Insufficient documentation

## 2014-09-30 LAB — MDC_IDC_ENUM_SESS_TYPE_INCLINIC
Brady Statistic RA Percent Paced: 60 %
Brady Statistic RV Percent Paced: 99.45 %
Implantable Pulse Generator Serial Number: 7555472
Lead Channel Impedance Value: 550 Ohm
Lead Channel Impedance Value: 887.5 Ohm
Lead Channel Pacing Threshold Amplitude: 0.75 V
Lead Channel Pacing Threshold Amplitude: 1.25 V
Lead Channel Pacing Threshold Pulse Width: 0.5 ms
Lead Channel Pacing Threshold Pulse Width: 0.5 ms
Lead Channel Setting Pacing Amplitude: 2 V
Lead Channel Setting Pacing Amplitude: 2.5 V
Lead Channel Setting Pacing Pulse Width: 0.5 ms
Lead Channel Setting Pacing Pulse Width: 0.5 ms
Lead Channel Setting Sensing Sensitivity: 5 mV
MDC IDC MSMT BATTERY REMAINING LONGEVITY: 90 mo
MDC IDC MSMT BATTERY VOLTAGE: 2.98 V
MDC IDC MSMT LEADCHNL LV PACING THRESHOLD PULSEWIDTH: 0.5 ms
MDC IDC MSMT LEADCHNL RA IMPEDANCE VALUE: 512.5 Ohm
MDC IDC MSMT LEADCHNL RA PACING THRESHOLD AMPLITUDE: 1 V
MDC IDC MSMT LEADCHNL RA SENSING INTR AMPL: 4.3 mV
MDC IDC PG MODEL: 3242
MDC IDC SESS DTM: 20160224125457
MDC IDC SET LEADCHNL LV PACING AMPLITUDE: 2.5 V

## 2014-09-30 NOTE — Progress Notes (Signed)
Electrophysiology Office Note   Date:  09/30/2014   ID:  Mark French, DOB May 08, 1946, MRN [id number]  PCP:  [name], MD  Cardiologist:  Dr Burt Knack Primary Electrophysiologist: Thompson Grayer, MD    Chief Complaint  Patient presents with  . Follow-up    CHB     History of Present Illness: Mark French is a 69 y.o. male who presents today for electrophysiology evaluation.   The patient presents today for routine EP follow-up.  He is chronically debilitated and not very active.  He reports chronic edema and shortness of breath with moderate exertion.   Today, he denies symptoms of palpitations, chest pain,  claudication, dizziness, presyncope, syncope, bleeding, or neurologic sequela. The patient is tolerating medications without difficulties and is otherwise without complaint today.    Past Medical History  Diagnosis Date  . CORONARY ARTERY DISEASE     a. s/p CABG 1998. b.  08/2011: inferior STEMI.  LHC 09/07/11: Severe 3v CAD, LIMA-LAD patent, SVG-RCA chronically occluded, SVG-circumflex chronically occluded, SVG-diagonal 95%. PCI: Promus DES to the SVG-diagonal.  Procedure c/b transient CHB req CPR and TTVP. c. Myoview 11/2011: scar but no ischemia.   [name] DIABETES MELLITUS, TYPE I   . HYPERTENSION   . 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 HYPERCHOLESTEROLEMIA   . CERVICAL RADICULOPATHY, LEFT   . PERIPHERAL NEUROPATHY   . Chronic systolic heart failure     Echocardiogram 08/09/13:Cavity size normal, EF 40%, inferior hypokinesis, grade 1 diastolic dysfunction, paradoxical septal motion, mild MR, mild LAE, no PFO, PAS 51, moderate pulm HTN  . ED (erectile dysfunction)   . Hypogonadism male   . GI bleed 2012    Multiple Duodenal ulcer   . Obesity   . Legally blind     bilaterally  . Stroke 69    age 8 months; residual right sided weakness  . History of blood transfusion 2012  . Charcot foot due to diabetes mellitus     chronic pain  . Osteoarthritis    right hip and shoulder  . COPD (chronic obstructive pulmonary disease)   . 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 Hiatal hernia   . Chronic stasis dermatitis     Chronic stasis changes.  . CKD (chronic kidney disease), stage III   . Syncope     a. 07/2013: suspected due to hypotension.  . Biventricular cardiac pacemaker in situ     a. [address] [employer] [place] RF model [id number] (serial number [id number])  . Complete heart block     a. s/p pacemaker placement on 08/2013 admission  . Duodenal ulcer    Past Surgical History  Procedure Laterality Date  . Carpal tunnel release Left   . Sigmoidoscopy  03/11/2001  . Venous doppler  01/30/2004  . Tonsillectomy and adenoidectomy      "as a kid"  . Cataract extraction w/ intraocular lens  implant, bilateral    . Coronary angioplasty with stent placement  08/2011    "1"  . Retinopathy surgery Bilateral 2000's    "laser; both eyes"  . Wrist fusion Right 1976  . Anterior fusion cervical spine  2010    "C spine; Dr. Ronnald Ramp"  . Ankle fracture surgery Left 1976; ?date    LEFT:  fused; removed bulk of hardware  . Heel spur surgery Left ~ 2007    ? left  . Irrigation and debridement knee  02/27/2012    Procedure: IRRIGATION AND DEBRIDEMENT KNEE;  Surgeon: Remo Lipps  Orlena Sheldon, MD;  Location: [facility];  Service: Orthopedics;  Laterality: Right;  irrigation and drainage right knee septic bursitis  . Eye surgery  2008    cataract removal, cautery  . Coronary artery bypass graft  1998    CABG X5  . Fracture surgery      foot broken 1976  . Pacemaker insertion  08/08/2013    Biventricular pacemaker: [place] RF model [id number] (serial number [id number])  . Left heart catheterization with coronary/graft angiogram N/A 09/07/2011    Procedure: LEFT HEART CATHETERIZATION WITH [name];  Surgeon: Jettie Booze, MD;  Location: [facility] CATH LAB;  Service: Cardiovascular;  Laterality: N/A;  . Temporary  pacemaker insertion  08/07/2013    Procedure: TEMPORARY PACEMAKER INSERTION;  Surgeon: Leonie Man, MD;  Location: [facility] CATH LAB;  Service: Cardiovascular;;  . Left heart catheterization with coronary/graft angiogram  08/07/2013    Procedure: LEFT HEART CATHETERIZATION WITH [name];  Surgeon: Leonie Man, MD;  Location: [facility] CATH LAB;  Service: Cardiovascular;;  . Bi-ventricular pacemaker insertion N/A 08/08/2013    Procedure: BI-VENTRICULAR PACEMAKER INSERTION (CRT-P);  Surgeon: Coralyn Mark, MD;  Location: [facility] CATH LAB;  Service: Cardiovascular;  Laterality: N/A;  . Left heart catheterization with coronary/graft angiogram N/A 10/27/2013    Procedure: LEFT HEART CATHETERIZATION WITH [name];  Surgeon: Sinclair Grooms, MD;  Location: [facility] CATH LAB;  Service: Cardiovascular;  Laterality: N/A;     Current Outpatient Prescriptions  Medication Sig Dispense Refill  . AMBULATORY NON FORMULARY MEDICATION Pt on O2 - 2 liter    . aspirin EC 81 MG tablet Take 81 mg by mouth at bedtime.    [name] atorvastatin (LIPITOR) 40 MG tablet Take 1 tablet (40 mg total) by mouth daily. 30 tablet 6  . calcitRIOL (ROCALTROL) 0.25 MCG capsule TAKE 1 CAPSULE BY MOUTH EVERY DAY 30 capsule 0  . carvedilol (COREG) 3.125 MG tablet TAKE 1 TABLET BY MOUTH TWICE DAILY WITH A MEAL 60 tablet 0  . clopidogrel (PLAVIX) 75 MG tablet Take 1 tablet (75 mg total) by mouth daily with breakfast. 30 tablet 6  . co-enzyme Q-10 30 MG capsule Take 30 mg by mouth daily. @@ 2pm    . docusate sodium (COLACE) 100 MG capsule Take 300 mg by mouth daily.     . famotidine (PEPCID) 20 MG tablet Take 20 mg by mouth before cath procedure. stomach    . furosemide (LASIX) 40 MG tablet Take 80 mg by mouth 2 (two) times daily.    [name] gabapentin (NEURONTIN) 400 MG capsule Take 2 capsules (800 mg total) by mouth 3 (three) times daily. 180 capsule 3  . HYDROcodone-acetaminophen (NORCO) 10-325 MG per tablet Take 1-2 tablets by mouth  every 4 (four) hours as needed for moderate pain. 150 tablet 0  . hyoscyamine (LEVSIN SL) 0.125 MG SL tablet Place 1 tablet (0.125 mg total) under the tongue every 4 (four) hours as needed. 120 tablet 2  . insulin NPH Human (HUMULIN N,NOVOLIN N) 100 UNIT/ML injection Inject 10-50 Units into the skin daily before breakfast. Per sliding scale    . isosorbide mononitrate (IMDUR) 30 MG 24 hr tablet TAKE 1 TABLET BY MOUTH EVERY DAY 30 tablet 3  . Linaclotide (LINZESS) 145 MCG CAPS capsule Take one po 30 minutes prior to breakfast 30 capsule 1  . LORazepam (ATIVAN) 0.5 MG tablet Take 0.5 mg by mouth every 6 (six) hours as needed for anxiety (anxiety).     [name]  losartan (COZAAR) 50 MG tablet TAKE 1/2 TABLET BY MOUTH EVERY DAY 45 tablet 3  . methadone (DOLOPHINE) 5 MG tablet Take 5 mg by mouth 2 (two) times daily.    . Morphine Sulfate (MORPHINE CONCENTRATE) 10 mg / 0.5 ml concentrated solution Place 0.25-0.5 mLs (5-10 mg total) under the tongue every 2 (two) hours as needed for moderate pain, severe pain, anxiety or shortness of breath. 15 mL 0  . nitroGLYCERIN (NITROSTAT) 0.4 MG SL tablet Place 1 tablet (0.4 mg total) under the tongue every 5 (five) minutes as needed for chest pain. 25 tablet 5  . OVER THE COUNTER MEDICATION Place 1 drop into both eyes daily as needed (dry eyes). [facility] eye drops    . pantoprazole (PROTONIX) 40 MG tablet TAKE 1 TABLET BY MOUTH TWICE DAILY 60 tablet 2  . polyethylene glycol powder (GLYCOLAX/MIRALAX) powder Take 0.5 Containers by mouth daily.   1  . potassium chloride SA (K-DUR,KLOR-CON) 20 MEQ tablet TAKE 1 TABLET BY MOUTH TWICE DAILY 60 tablet 3  . tamsulosin (FLOMAX) 0.4 MG CAPS capsule Take 1 capsule (0.4 mg total) by mouth daily. 30 capsule 3  . tolnaftate (ANTI-FUNGAL) 1 % powder Apply 1 application topically 3 (three) times a week. As needed for infection     No current facility-administered medications for this visit.    Allergies:   Sildenafil; Percocet; and  Contrast media   Social History:  The patient  reports that he has never smoked. He has never used smokeless tobacco. He reports that he does not drink alcohol or use illicit drugs.   Family History:  The patient's family history includes CAD in his father, mother, and paternal grandmother; Cancer in his maternal grandmother; Diabetes in his brother and maternal grandmother. There is no history of Colon cancer.    ROS:  Please see the history of present illness.   All other systems are reviewed and negative.    PHYSICAL EXAM: VS:  BP 110/58 mmHg  Pulse 63  Ht 5\' 1"  (1.549 m)  GEN: overweight and chronically ill, in no acute distress HEENT: normal Neck: no JVD, carotid bruits, or masses Cardiac: RRR; no murmurs, rubs, or gallops,+ dependant edema  Respiratory:  clear to auscultation bilaterally, normal work of breathing GI: soft, nontender, nondistended, + BS MS: no deformity or atrophy Skin: warm and dry, device pocket is well healed Neuro:  Strength and sensation are intact Psych: euthymic mood, full affect  EKG:  EKG is ordered today. The ekg ordered today shows sinus rhythm with BiV pacing  Device interrogation is reviewed today in detail.  See PaceArt for details.   Recent Labs: 08/21/2014: ALT 9; Hemoglobin 11.4*; Platelets 206.0; TSH 1.51 08/25/2014: BUN 19; Creatinine 1.21; Potassium 3.9; Pro B Natriuretic peptide (BNP) 179.0*; Sodium 137    Lipid Panel     Component Value Date/Time   CHOL 112 08/21/2014 0919   TRIG 104.0 08/21/2014 0919   TRIG 180* 06/21/2006 0735   HDL 34.90* 08/21/2014 0919   CHOLHDL 3 08/21/2014 0919   CHOLHDL 4.5 CALC 06/21/2006 0735   VLDL 20.8 08/21/2014 0919   LDLCALC 56 08/21/2014 0919   LDLDIRECT 158.5 07/19/2010 1626   LDLDIRECT 109.9 05/16/2006 0736     Wt Readings from Last 3 Encounters:  09/01/14 192 lb (87.091 kg)  08/21/14 197 lb (89.359 kg)  05/21/14 205 lb (92.987 kg)      Other studies Reviewed: Additional studies/  records that were reviewed today include: Dr Burt Knack notes  ASSESSMENT AND PLAN:  1.  Complete heart block Normal BiV pacemaker function No changes today  2. Chronic systolic dysfunction/ ischemic CM Continue current medical therapy Obtain an echo to evaluate response to CRT. If EF remains depressed, will consider device optimization  3. Hypertensive cardiovascular disease Stable No change required today   Current medicines are reviewed at length with the patient today.   The patient does not have concerns regarding his medicines.  The following changes were made today:  none  Labs/ tests ordered today include:  Orders Placed This Encounter  Procedures  . Implantable device check  . EKG 12-Lead  . 2D Echocardiogram without contrast    Follow-up with Chanetta Marshall in the device clinic in 6 months Follow-up with Dr Burt Knack as scheduled [place] remote monitoring  Signed, Thompson Grayer, MD  09/30/2014 11:09 PM     [facility] [address] [place] [address] [phone] (office) [phone] (fax)

## 2014-09-30 NOTE — Patient Instructions (Signed)
Your physician wants you to follow-up in: 12 months with Chanetta Marshall, NP You will receive a reminder letter in the mail two months in advance. If you don't receive a letter, please call our office to schedule the follow-up appointment.   Remote monitoring is used to monitor your Pacemaker or ICD from home. This monitoring reduces the number of office visits required to check your device to one time per year. It allows [name] to keep an eye on the functioning of your device to ensure it is working properly. You are scheduled for a device check from home on 12/30/14. You may send your transmission at any time that day. If you have a wireless device, the transmission will be sent automatically. After your physician reviews your transmission, you will receive a postcard with your next transmission date.   Alvis Lemmings, RN will call you regarding following up in [facility] clinic to follow fluid levels  Your physician has requested that you have an echocardiogram. Echocardiography is a painless test that uses sound waves to create images of your heart. It provides your doctor with information about the size and shape of your heart and how well your heart's chambers and valves are working. This procedure takes approximately one hour. There are no restrictions for this procedure.  Low-Sodium Eating Plan Sodium raises blood pressure and causes water to be held in the body. Getting less sodium from food will help lower your blood pressure, reduce any swelling, and protect your heart, liver, and kidneys. We get sodium by adding salt (sodium chloride) to food. Most of our sodium comes from canned, boxed, and frozen foods. Restaurant foods, fast foods, and pizza are also very high in sodium. Even if you take medicine to lower your blood pressure or to reduce fluid in your body, getting less sodium from your food is important. WHAT IS MY PLAN? Most people should limit their sodium intake to 2,300 mg a day. Your health care  provider recommends that you limit your sodium intake to 2 grams a day.  WHAT DO I NEED TO KNOW ABOUT THIS EATING PLAN? For the low-sodium eating plan, you will follow these general guidelines:  Choose foods with a % Daily Value for sodium of less than 5% (as listed on the food label).   Use salt-free seasonings or herbs instead of table salt or sea salt.   Check with your health care provider or pharmacist before using salt substitutes.   Eat fresh foods.  Eat more vegetables and fruits.  Limit canned vegetables. If you do use them, rinse them well to decrease the sodium.   Limit cheese to 1 oz (28 g) per day.   Eat lower-sodium products, often labeled as "lower sodium" or "no salt added."  Avoid foods that contain monosodium glutamate (MSG). MSG is sometimes added to [place] food and some canned foods.  Check food labels (Nutrition Facts labels) on foods to learn how much sodium is in one serving.  Eat more home-cooked food and less restaurant, buffet, and fast food.  When eating at a restaurant, ask that your food be prepared with less salt or none, if possible.  HOW DO I READ FOOD LABELS FOR SODIUM INFORMATION? The Nutrition Facts label lists the amount of sodium in one serving of the food. If you eat more than one serving, you must multiply the listed amount of sodium by the number of servings. Food labels may also identify foods as:  Sodium free--Less than 5 mg in a  serving.  Very low sodium--35 mg or less in a serving.  Low sodium--140 mg or less in a serving.  Light in sodium--50% less sodium in a serving. For example, if a food that usually has 300 mg of sodium is changed to become light in sodium, it will have 150 mg of sodium.  Reduced sodium--25% less sodium in a serving. For example, if a food that usually has 400 mg of sodium is changed to reduced sodium, it will have 300 mg of sodium. WHAT FOODS CAN I EAT? Grains Low-sodium cereals, including oats,  puffed wheat and rice, and shredded wheat cereals. Low-sodium crackers. Unsalted rice and pasta. Lower-sodium bread.  Vegetables Frozen or fresh vegetables. Low-sodium or reduced-sodium canned vegetables. Low-sodium or reduced-sodium tomato sauce and paste. Low-sodium or reduced-sodium tomato and vegetable juices.  Fruits Fresh, frozen, and canned fruit. Fruit juice.  Meat and Other Protein Products Low-sodium canned tuna and salmon. Fresh or frozen meat, poultry, seafood, and fish. Lamb. Unsalted nuts. Dried beans, peas, and lentils without added salt. Unsalted canned beans. Homemade soups without salt. Eggs.  Dairy Milk. Soy milk. Ricotta cheese. Low-sodium or reduced-sodium cheeses. Yogurt.  Condiments Fresh and dried herbs and spices. Salt-free seasonings. Onion and garlic powders. Low-sodium varieties of mustard and ketchup. Lemon juice.  Fats and Oils Reduced-sodium salad dressings. Unsalted butter.  Other Unsalted popcorn and pretzels.  The items listed above may not be a complete list of recommended foods or beverages. Contact your dietitian for more options. WHAT FOODS ARE NOT RECOMMENDED? Grains Instant hot cereals. Bread stuffing, pancake, and biscuit mixes. Croutons. Seasoned rice or pasta mixes. Noodle soup cups. Boxed or frozen macaroni and cheese. Self-rising flour. Regular salted crackers. Vegetables Regular canned vegetables. Regular canned tomato sauce and paste. Regular tomato and vegetable juices. Frozen vegetables in sauces. Salted french fries. Olives. Angie Fava. Relishes. Sauerkraut. Salsa. Meat and Other Protein Products Salted, canned, smoked, spiced, or pickled meats, seafood, or fish. Bacon, ham, sausage, hot dogs, corned beef, chipped beef, and packaged luncheon meats. Salt pork. Jerky. Pickled herring. Anchovies, regular canned tuna, and sardines. Salted nuts. Dairy Processed cheese and cheese spreads. Cheese curds. Blue cheese and cottage cheese.  Buttermilk.  Condiments Onion and garlic salt, seasoned salt, table salt, and sea salt. Canned and packaged gravies. Worcestershire sauce. Tartar sauce. Barbecue sauce. Teriyaki sauce. Soy sauce, including reduced sodium. Steak sauce. Fish sauce. Oyster sauce. Cocktail sauce. Horseradish. Regular ketchup and mustard. Meat flavorings and tenderizers. Bouillon cubes. Hot sauce. Tabasco sauce. Marinades. Taco seasonings. Relishes. Fats and Oils Regular salad dressings. Salted butter. Margarine. Ghee. Bacon fat.  Other Potato and tortilla chips. Corn chips and puffs. Salted popcorn and pretzels. Canned or dried soups. Pizza. Frozen entrees and pot pies.  The items listed above may not be a complete list of foods and beverages to avoid. Contact your dietitian for more information. Document Released: 01/13/2002 Document Revised: 07/29/2013 Document Reviewed: 05/28/2013 [facility] Patient Information 2015 [place], [place]. This information is not intended to replace advice given to you by your health care provider. Make sure you discuss any questions you have with your health care provider.

## 2014-10-05 ENCOUNTER — Telehealth: Payer: Self-pay | Admitting: Internal Medicine

## 2014-10-05 ENCOUNTER — Encounter: Payer: Self-pay | Admitting: Internal Medicine

## 2014-10-05 NOTE — Telephone Encounter (Signed)
Wife called and cancel 3/216 echo due to he was in the hospital.  Need to know if he still need the echo.

## 2014-10-06 NOTE — Telephone Encounter (Signed)
Will call wife and determine if patient is in the hospital(like phone note states) or hospice (like cancellation notice states)  If in hospital maybe can get it while there if not then will need to determine if still needed

## 2014-10-07 ENCOUNTER — Other Ambulatory Visit (HOSPITAL_COMMUNITY): Payer: Commercial Managed Care - HMO

## 2014-10-09 ENCOUNTER — Other Ambulatory Visit: Payer: Self-pay | Admitting: *Deleted

## 2014-10-09 ENCOUNTER — Other Ambulatory Visit: Payer: Self-pay

## 2014-10-09 MED ORDER — POTASSIUM CHLORIDE CRYS ER 20 MEQ PO TBCR: 20.0000 meq | EXTENDED_RELEASE_TABLET | Freq: Two times a day (BID) | ORAL | Status: DC

## 2014-10-09 MED ORDER — CARVEDILOL 3.125 MG PO TABS: ORAL_TABLET | ORAL | Status: DC

## 2014-10-12 ENCOUNTER — Other Ambulatory Visit: Payer: Self-pay | Admitting: Internal Medicine

## 2014-10-27 ENCOUNTER — Other Ambulatory Visit: Payer: Self-pay | Admitting: Endocrinology

## 2014-11-15 ENCOUNTER — Other Ambulatory Visit: Payer: Self-pay | Admitting: Internal Medicine

## 2014-11-16 NOTE — Telephone Encounter (Signed)
Sent Rx for pantoprazole (PROTONIX), 40 mg, #60 with one refill to [facility] [address] in [place], [place] on 11/16/14.

## 2014-11-20 ENCOUNTER — Encounter: Payer: Self-pay | Admitting: Endocrinology

## 2014-11-20 ENCOUNTER — Ambulatory Visit (INDEPENDENT_AMBULATORY_CARE_PROVIDER_SITE_OTHER): Admitting: Endocrinology

## 2014-11-20 VITALS — BP 118/54 | HR 68 | Temp 98.1°F | Ht 61.0 in | Wt 195.0 lb

## 2014-11-20 DIAGNOSIS — E1149 Type 2 diabetes mellitus with other diabetic neurological complication: Secondary | ICD-10-CM | POA: Diagnosis not present

## 2014-11-20 DIAGNOSIS — Z0000 Encounter for general adult medical examination without abnormal findings: Secondary | ICD-10-CM

## 2014-11-20 DIAGNOSIS — N259 Disorder resulting from impaired renal tubular function, unspecified: Secondary | ICD-10-CM | POA: Diagnosis not present

## 2014-11-20 DIAGNOSIS — D509 Iron deficiency anemia, unspecified: Secondary | ICD-10-CM

## 2014-11-20 DIAGNOSIS — Z23 Encounter for immunization: Secondary | ICD-10-CM

## 2014-11-20 LAB — IBC PANEL
Iron: 51 ug/dL (ref 42–165)
Saturation Ratios: 13.7 % — ABNORMAL LOW (ref 20.0–50.0)
Transferrin: 265 mg/dL (ref 212.0–360.0)

## 2014-11-20 LAB — CBC WITH DIFFERENTIAL/PLATELET
Basophils Absolute: 0 10*3/uL (ref 0.0–0.1)
Basophils Relative: 0.4 % (ref 0.0–3.0)
Eosinophils Absolute: 0.4 10*3/uL (ref 0.0–0.7)
Eosinophils Relative: 6.6 % — ABNORMAL HIGH (ref 0.0–5.0)
HCT: 39.1 % (ref 39.0–52.0)
Hemoglobin: 13 g/dL (ref 13.0–17.0)
Lymphocytes Relative: 27.1 % (ref 12.0–46.0)
Lymphs Abs: 1.5 10*3/uL (ref 0.7–4.0)
MCHC: 33.2 g/dL (ref 30.0–36.0)
MCV: 82.9 fl (ref 78.0–100.0)
MONO ABS: 0.6 10*3/uL (ref 0.1–1.0)
Monocytes Relative: 11.4 % (ref 3.0–12.0)
Neutro Abs: 2.9 10*3/uL (ref 1.4–7.7)
Neutrophils Relative %: 54.5 % (ref 43.0–77.0)
PLATELETS: 170 10*3/uL (ref 150.0–400.0)
RBC: 4.71 Mil/uL (ref 4.22–5.81)
RDW: 17.8 % — AB (ref 11.5–15.5)
WBC: 5.4 10*3/uL (ref 4.0–10.5)

## 2014-11-20 LAB — BASIC METABOLIC PANEL
BUN: 23 mg/dL (ref 6–23)
CHLORIDE: 99 meq/L (ref 96–112)
CO2: 30 mEq/L (ref 19–32)
Calcium: 9.4 mg/dL (ref 8.4–10.5)
Creatinine, Ser: 1.11 mg/dL (ref 0.40–1.50)
GFR: 69.37 mL/min (ref >60.00)
GLUCOSE: 108 mg/dL — AB (ref 70–99)
Potassium: 3.9 mEq/L (ref 3.5–5.1)
SODIUM: 135 meq/L (ref 135–145)

## 2014-11-20 LAB — HEMOGLOBIN A1C: Hgb A1c MFr Bld: 6.5 % (ref 4.6–6.5)

## 2014-11-20 MED ORDER — BAYER BREEZE 2 SYSTEM W/DEVICE KIT: 1.0000 | PACK | Freq: Once | Status: AC

## 2014-11-20 NOTE — Patient Instructions (Addendum)
Please come back for a follow-up appointment in 3 months  check your blood sugar twice a day.  vary the time of day when you check, between before the 3 meals, and at bedtime.  also check if you have symptoms of your blood sugar being too high or too low.  please keep a record of the readings and bring it to your next appointment here.  You can write it on any piece of paper.  please call us sooner if your blood sugar goes below 70, or if you have a lot of readings over 200.  On this type of insulin schedule, you should eat meals on a regular schedule.  If a meal is missed or significantly delayed, your blood sugar could go low.   blood tests are requested for you today.  We'll let you know about the results.  Please reduce the insulin to 10 units each morning. i have sent a prescription to your pharmacy, for a new meter. please consider these measures for your health:  minimize alcohol.  do not use tobacco products.  have a colonoscopy at least every 10 years from age 60.  keep firearms safely stored.  always use seat belts.  have working smoke alarms in your home.  see an eye doctor and dentist regularly.  never drive under the influence of alcohol or drugs (including prescription drugs).  those with fair skin should take precautions against the sun. good diet and exercise significantly improve your health.  please let me know if you wish to be referred to a dietician.  high blood sugar is very risky to your health.  you should see an eye doctor and dentist every year.  It is very important to get all recommended vaccinations. it is critically important to prevent falling down (keep floor areas well-lit, dry, and free of loose objects.  If you have a cane, walker, or wheelchair, you should use it, even for short trips around the house.  Also, try not to rush).

## 2014-11-20 NOTE — Progress Notes (Signed)
Subjective:    Patient ID: Mark French, male    DOB: 25-Apr-1946, 70 y.o.   MRN: [id number]  HPI History is from patient and wife.   Pt returns for f/u of diabetes mellitus: DM type: Insulin-requiring type 2 Dx'ed: 4098 Complications: polyneuropathy, CAD, renal insufficiency, and retinopathy. Therapy: insulin since 1998.  DKA: never. Severe hypoglycemia: never. Pancreatitis: never. Other: therapy has been limited by pt's need for a simple, inexpensive insulin regimen; he receives hospice care.   Interval history: Wife continues to give him more than the prescribed insulin dosage.  she brings a record of her cbg's which i have reviewed today.  She checks in am only.  It varies from 100-200's.   He denies hypoglycemia Anemia: he takes fe 1/day.  Denies brbpr. Renal insuff: he denies decreased urinary stream.   Review of Systems Denies hematuria and dysuria.    Objective:   Physical Exam VITAL SIGNS:  See vs page GENERAL: no distress.  In scooter.      Lab Results  Component Value Date   HGBA1C 6.5 11/20/2014   Lab Results  Component Value Date   WBC 5.4 11/20/2014   HGB 13.0 11/20/2014   HCT 39.1 11/20/2014   MCV 82.9 11/20/2014   PLT 170.0 11/20/2014      Assessment & Plan:  DM: overcontrolled.   Take only 10 units qd. Noncompliance with insulin dosing: wife is told to not exceed prescribed dosage. Anemia: improved.  No rx needed now, but we'll follow Renal insuff: we'll follow     Subjective:   Patient here for Medicare annual wellness visit and management of other chronic and acute problems.     Risk factors: advanced age    58 of Physicians Providing Medical Care to Patient:  See "snapshot"   Activities of Daily Living: In your present state of health, do you have any difficulty performing the following activities (wife helps)?:  Preparing food and eating?: yes Bathing yourself: No Getting dressed: yes Using the toilet: no  Moving around  from place to place: No  In the past year have you fallen or had a near fall?: yes, once recently.  No injury  Home Safety: Has smoke detector and wears seat belts. No firearms. No excess sun exposure.  Diet and Exercise  Current exercise habits: very limited by health problems Dietary issues discussed: pt reports a healthy diet   Depression Screen  Q1: Over the past two weeks, have you felt down, depressed or hopeless?no  Q2: Over the past two weeks, have you felt little interest or pleasure in doing things? no   The following portions of the patient's history were reviewed and updated as appropriate: allergies, current medications, past family history, past medical history, past social history, past surgical history and problem list.   Review of Systems  Denies hearing loss; no change in chronic visual loss Objective:   Vision:  Sees opthalmologist Hearing: grossly normal Body mass index:  See vs page Msk: pt easily and quickly performs "get-up-and-go" from a sitting position Cognitive Impairment Assessment: cognition, memory and judgment appear normal.  remembers 2/3 at 5 minutes (? Effort).  excellent recall.  can easily read and write a sentence.  alert and oriented x 3.     Assessment:   Medicare wellness utd on preventive parameters    Plan:   During the course of the visit the patient was educated and counseled about appropriate screening and preventive services including:  Fall prevention    Diabetes screening  Nutrition counseling   Vaccines / LABS Zostavax / Pneumococcal Vaccine  today  PSA  Patient Instructions (the written plan) was given to the patient.

## 2014-11-24 ENCOUNTER — Other Ambulatory Visit: Payer: Self-pay | Admitting: Endocrinology

## 2014-12-03 ENCOUNTER — Telehealth: Payer: Self-pay | Admitting: Cardiology

## 2014-12-03 NOTE — Telephone Encounter (Signed)
Pt does not want to follow up w/ ICM clinic. Pt wife instructed how to send manual transmission.

## 2014-12-07 ENCOUNTER — Telehealth: Payer: Self-pay | Admitting: Endocrinology

## 2014-12-07 DIAGNOSIS — I214 Non-ST elevation (NSTEMI) myocardial infarction: Secondary | ICD-10-CM

## 2014-12-07 DIAGNOSIS — E1022 Type 1 diabetes mellitus with diabetic chronic kidney disease: Secondary | ICD-10-CM

## 2014-12-07 NOTE — Telephone Encounter (Signed)
done

## 2014-12-07 NOTE — Telephone Encounter (Signed)
Patient need a referral to see Dr Zadie Rhine eye doctor , please advise

## 2014-12-07 NOTE — Telephone Encounter (Signed)
See note below   Thanks

## 2014-12-08 ENCOUNTER — Other Ambulatory Visit: Payer: Self-pay | Admitting: Cardiovascular Disease

## 2014-12-08 NOTE — Telephone Encounter (Signed)
Patients wife notified

## 2014-12-08 NOTE — Telephone Encounter (Signed)
Patient is calling to when her husband appt is please advise

## 2014-12-08 NOTE — Telephone Encounter (Signed)
done

## 2014-12-08 NOTE — Telephone Encounter (Signed)
Pt called requesting a referral for the patients cardiologist, Dr. Copper. Pt has a follow up appointment scheduled with him on 12-29-2014.

## 2014-12-14 LAB — HM DIABETES EYE EXAM

## 2014-12-15 ENCOUNTER — Other Ambulatory Visit: Payer: Self-pay

## 2014-12-15 MED ORDER — CARVEDILOL 3.125 MG PO TABS: ORAL_TABLET | ORAL | Status: AC

## 2014-12-17 ENCOUNTER — Telehealth: Payer: Self-pay | Admitting: Internal Medicine

## 2014-12-17 ENCOUNTER — Other Ambulatory Visit: Payer: Self-pay | Admitting: Internal Medicine

## 2014-12-17 NOTE — Telephone Encounter (Signed)
A user error has taken place.

## 2014-12-17 NOTE — Telephone Encounter (Signed)
Patient's insurance [facility]) will not cover Levsin. The cost for patient is $115. The patient did buy for this month. When the patient tried Linzess, it did not help. Is there another medication the patient can take? Please advise.

## 2014-12-17 NOTE — Telephone Encounter (Signed)
I am not sure what we are treating here. Is he constipated? Having pain? Diarrhea?[name]

## 2014-12-18 NOTE — Telephone Encounter (Signed)
Please see below.

## 2014-12-18 NOTE — Telephone Encounter (Signed)
Talked to patient's wife. She said that her husband has been using Levsin for stomach pains and it has helped him feel better. The patient is still having some pain in their lower abdominal region.

## 2014-12-19 NOTE — Telephone Encounter (Signed)
Rectal pressure could be due to constipation. Please, continue Miralax

## 2014-12-21 ENCOUNTER — Telehealth: Payer: Self-pay | Admitting: *Deleted

## 2014-12-21 NOTE — Telephone Encounter (Signed)
See below

## 2014-12-21 NOTE — Telephone Encounter (Signed)
You have requested patient to take Bentyl 20 mg, bid, with #30 with no refills. Do you want this patient to only take for 15 days? This is for lower abdominal pain. Please advise.

## 2014-12-22 NOTE — Telephone Encounter (Signed)
No refill, bentyl 20 bid, #30,

## 2014-12-23 ENCOUNTER — Ambulatory Visit: Payer: Commercial Managed Care - HMO | Admitting: Occupational Therapy

## 2014-12-23 MED ORDER — DICYCLOMINE HCL 20 MG PO TABS: 20.0000 mg | ORAL_TABLET | Freq: Two times a day (BID) | ORAL | Status: AC

## 2014-12-23 NOTE — Addendum Note (Signed)
Addended by: Celene Skeen A on: 12/23/2014 08:33 AM   Modules accepted: Orders

## 2014-12-23 NOTE — Telephone Encounter (Signed)
Sent Rx for Bentyl, 20 mg, #30 with no refills to [facility] [address] in [place], [place].

## 2014-12-28 ENCOUNTER — Telehealth: Payer: Self-pay | Admitting: Endocrinology

## 2014-12-28 NOTE — Telephone Encounter (Signed)
See note below   Thanks

## 2014-12-28 NOTE — Telephone Encounter (Signed)
Patients wife called stating that her husband needs a referral to Neuro Rehabilitation   Please advise patient    Thank you

## 2014-12-28 NOTE — Telephone Encounter (Signed)
i need to see the report from this dr

## 2014-12-28 NOTE — Telephone Encounter (Signed)
I contacted the patient's wife. She states the referral is for a low vision evaluation. Patient's wife stated this was a recommendation of Dr. Wayne Sever the patients ophthalmologist .

## 2014-12-28 NOTE — Telephone Encounter (Signed)
i need to know a dx for this

## 2014-12-29 ENCOUNTER — Ambulatory Visit (INDEPENDENT_AMBULATORY_CARE_PROVIDER_SITE_OTHER): Payer: Medicare Other | Admitting: Cardiovascular Disease

## 2014-12-29 ENCOUNTER — Encounter: Payer: Self-pay | Admitting: Cardiovascular Disease

## 2014-12-29 ENCOUNTER — Other Ambulatory Visit: Payer: Self-pay

## 2014-12-29 VITALS — BP 110/52 | HR 63 | Ht 61.0 in

## 2014-12-29 DIAGNOSIS — I5022 Chronic systolic (congestive) heart failure: Secondary | ICD-10-CM | POA: Diagnosis not present

## 2014-12-29 NOTE — Progress Notes (Signed)
Cardiology Office Note Date:  12/29/2014   ID:  [name], DOB Jun 23, 1946, MRN [id number]  PCP:  [name], MD  Cardiologist:  Sherren Mocha, MD    Chief Complaint  Patient presents with  . Follow-up    4 month f/u     History of Present Illness: Mark French is a 69 y.o. male who presents for follow-up of advanced heart failure. The patient has extensive CAD status post CABG and PCI, history of recurrent ischemic events with non-ST elevation infarctions, permanent pacemaker, diabetes, and severe peripheral neuropathy. He has been in hospice now for greater than 1 year. He had multiple hospitalizations in 2014 and 2015, but has been able to stay out of the hospital now for the past year with hospice involved in his home.  He's had a few episodes of pain 'around the pacemaker.' Describes pain as sharp, self-limited. Chronic leg swelling is unchanged - states legs are 'doing good.' The left leg has been swollen ever since vein harvesting was done at the time of CABG. Doing some walking with aid of a walker. He's using NTG a few times every month, but denies any change in anginal pattern over time.    Past Medical History  Diagnosis Date  . CORONARY ARTERY DISEASE     a. s/p CABG 1998. b.  08/2011: inferior STEMI.  LHC 09/07/11: Severe 3v CAD, LIMA-LAD patent, SVG-RCA chronically occluded, SVG-circumflex chronically occluded, SVG-diagonal 95%. PCI: Promus DES to the SVG-diagonal.  Procedure c/b transient CHB req CPR and TTVP. c. Myoview 11/2011: scar but no ischemia.   Marland French DIABETES MELLITUS, TYPE I   . HYPERTENSION   . 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 HYPERCHOLESTEROLEMIA   . CERVICAL RADICULOPATHY, LEFT   . PERIPHERAL NEUROPATHY   . Chronic systolic heart failure     Echocardiogram 08/09/13:Cavity size normal, EF 40%, inferior hypokinesis, grade 1 diastolic dysfunction, paradoxical septal motion, mild MR, mild LAE, no PFO, PAS 51, moderate pulm HTN  . ED  (erectile dysfunction)   . Hypogonadism male   . GI bleed 2012    Multiple Duodenal ulcer   . Obesity   . Legally blind     bilaterally  . Stroke 50    age 16 months; residual right sided weakness  . History of blood transfusion 2012  . Charcot foot due to diabetes mellitus     chronic pain  . Osteoarthritis     right hip and shoulder  . COPD (chronic obstructive pulmonary disease)   . 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 Hiatal hernia   . Chronic stasis dermatitis     Chronic stasis changes.  . CKD (chronic kidney disease), stage III   . Syncope     a. 07/2013: suspected due to hypotension.  . Biventricular cardiac pacemaker in situ     a. [address] [name] model [id number] (serial number [id number])  . Complete heart block     a. s/p pacemaker placement on 08/2013 admission  . Duodenal ulcer     Past Surgical History  Procedure Laterality Date  . Carpal tunnel release Left   . Sigmoidoscopy  03/11/2001  . Venous doppler  01/30/2004  . Tonsillectomy and adenoidectomy      "as a kid"  . Cataract extraction w/ intraocular lens  implant, bilateral    . Coronary angioplasty with stent placement  08/2011    "1"  . Retinopathy surgery Bilateral 2000's    "laser;  both eyes"  . Wrist fusion Right 1976  . Anterior fusion cervical spine  2010    "C spine; Dr. Ronnald Ramp"  . Ankle fracture surgery Left 1976; ?date    LEFT:  fused; removed bulk of hardware  . Heel spur surgery Left ~ 2007    ? left  . Irrigation and debridement knee  02/27/2012    Procedure: IRRIGATION AND DEBRIDEMENT KNEE;  Surgeon: Augustin Schooling, MD;  Location: [facility];  Service: Orthopedics;  Laterality: Right;  irrigation and drainage right knee septic bursitis  . Eye surgery  2008    cataract removal, cautery  . Coronary artery bypass graft  1998    CABG X5  . Fracture surgery      foot broken 1976  . Pacemaker insertion  08/08/2013    Biventricular pacemaker: [place] RF model [id number] (serial number [id number])  . Left heart catheterization with coronary/graft angiogram N/A 09/07/2011    Procedure: LEFT HEART CATHETERIZATION WITH [name];  Surgeon: Jettie Booze, MD;  Location: [facility] CATH LAB;  Service: Cardiovascular;  Laterality: N/A;  . Temporary pacemaker insertion  08/07/2013    Procedure: TEMPORARY PACEMAKER INSERTION;  Surgeon: Leonie Man, MD;  Location: [facility] CATH LAB;  Service: Cardiovascular;;  . Left heart catheterization with coronary/graft angiogram  08/07/2013    Procedure: LEFT HEART CATHETERIZATION WITH [name];  Surgeon: Leonie Man, MD;  Location: [facility] CATH LAB;  Service: Cardiovascular;;  . Bi-ventricular pacemaker insertion N/A 08/08/2013    Procedure: BI-VENTRICULAR PACEMAKER INSERTION (CRT-P);  Surgeon: Coralyn Mark, MD;  Location: [facility] CATH LAB;  Service: Cardiovascular;  Laterality: N/A;  . Left heart catheterization with coronary/graft angiogram N/A 10/27/2013    Procedure: LEFT HEART CATHETERIZATION WITH [name];  Surgeon: Sinclair Grooms, MD;  Location: [facility] CATH LAB;  Service: Cardiovascular;  Laterality: N/A;    Current Outpatient Prescriptions  Medication Sig Dispense Refill  . AMBULATORY NON FORMULARY MEDICATION Pt on O2 - 2 liter    . aspirin EC 81 MG tablet Take 81 mg by mouth at bedtime.    Marland French atorvastatin (LIPITOR) 40 MG tablet Take 1 tablet (40 mg total) by mouth daily. 30 tablet 6  . Blood Glucose Monitoring Suppl (BAYER BREEZE 2 SYSTEM) W/DEVICE KIT 1 Device by Does not apply route once. 1 each 0  . calcitRIOL (ROCALTROL) 0.25 MCG capsule TAKE 1 CAPSULE BY MOUTH EVERY DAY 30 capsule 0  . carvedilol (COREG) 3.125 MG tablet TAKE 1 TABLET BY MOUTH TWICE DAILY WITH A MEAL 60 tablet 5  . clopidogrel (PLAVIX) 75 MG tablet Take 1 tablet (75 mg total) by mouth daily with breakfast. 30 tablet 6  . co-enzyme Q-10 30 MG capsule Take 30 mg by mouth daily. @@ 2pm      . dicyclomine (BENTYL) 20 MG tablet Take 1 tablet (20 mg total) by mouth 2 (two) times daily. 30 tablet 0  . docusate sodium (COLACE) 100 MG capsule Take 300 mg by mouth daily.     . famotidine (PEPCID) 20 MG tablet Take 20 mg by mouth before cath procedure. stomach    . furosemide (LASIX) 40 MG tablet Take 80 mg by mouth 2 (two) times daily.    Marland French gabapentin (NEURONTIN) 400 MG capsule Take 2 capsules (800 mg total) by mouth 3 (three) times daily. 180 capsule 3  . HYDROcodone-acetaminophen (NORCO) 10-325 MG per tablet Take 1-2 tablets by mouth every 4 (four) hours as needed for  moderate pain. 150 tablet 0  . hyoscyamine (LEVSIN SL) 0.125 MG SL tablet PLACE 1 TABLET UNDER THE TONGUE EVERY 4 HOURS AS NEEDED 120 tablet 1  . insulin NPH Human (HUMULIN N,NOVOLIN N) 100 UNIT/ML injection Inject 10 Units into the skin daily before breakfast.     . isosorbide mononitrate (IMDUR) 30 MG 24 hr tablet TAKE 1 TABLET BY MOUTH EVERY DAY 30 tablet 0  . LORazepam (ATIVAN) 0.5 MG tablet Take 0.5 mg by mouth every 6 (six) hours as needed for anxiety (anxiety).     Marland French losartan (COZAAR) 50 MG tablet TAKE 1/2 TABLET BY MOUTH EVERY DAY 45 tablet 3  . methadone (DOLOPHINE) 5 MG tablet Take 5 mg by mouth 2 (two) times daily.    . Morphine Sulfate (MORPHINE CONCENTRATE) 10 mg / 0.5 ml concentrated solution Place 0.25-0.5 mLs (5-10 mg total) under the tongue every 2 (two) hours as needed for moderate pain, severe pain, anxiety or shortness of breath. 15 mL 0  . nitroGLYCERIN (NITROSTAT) 0.4 MG SL tablet Place 1 tablet (0.4 mg total) under the tongue every 5 (five) minutes as needed for chest pain. 25 tablet 5  . OVER THE COUNTER MEDICATION Place 1 drop into both eyes daily as needed (dry eyes). [facility] eye drops    . pantoprazole (PROTONIX) 40 MG tablet TAKE 1 TABLET BY MOUTH TWICE DAILY 60 tablet 1  . polyethylene glycol powder (GLYCOLAX/MIRALAX) powder Take 0.5 Containers by mouth daily.   1  . potassium chloride SA  (K-DUR,KLOR-CON) 20 MEQ tablet Take 1 tablet (20 mEq total) by mouth 2 (two) times daily. 60 tablet 3  . tamsulosin (FLOMAX) 0.4 MG CAPS capsule Take 1 capsule (0.4 mg total) by mouth daily. 30 capsule 3  . tolnaftate (ANTI-FUNGAL) 1 % powder Apply 1 application topically 3 (three) times a week. As needed for infection     No current facility-administered medications for this visit.   Allergies:   Sildenafil; Percocet; and Contrast media   Social History:  The patient  reports that he has never smoked. He has never used smokeless tobacco. He reports that he does not drink alcohol or use illicit drugs.   Family History:  The patient's family history includes CAD in his father, mother, and paternal grandmother; Cancer in his maternal grandmother; Diabetes in his brother and maternal grandmother. There is no history of Colon cancer.   ROS:  Please see the history of present illness.  Otherwise, review of systems is positive for chest pain, abdominal pain, back pain, constipation, anxiety, balance problems.  All other systems are reviewed and negative.   PHYSICAL EXAM: VS:  BP 110/52 mmHg  Pulse 63  Ht 5' 1"  (1.549 m)  Wt  , BMI There is no weight on file to calculate BMI. GEN: Well nourished, well developed, in no acute distress HEENT: normal Neck: no JVD, no masses. No carotid bruits Cardiac: RRR without murmur or gallop                Respiratory:  clear to auscultation bilaterally, normal work of breathing GI: soft, nontender, nondistended, + BS MS: no deformity or atrophy Ext: no pretibial edema, pedal pulses 2+= bilaterally Skin: warm and dry, no rash Neuro:  Strength and sensation are intact Psych: euthymic mood, full affect  EKG:  EKG is not ordered today.  Recent Labs: 08/21/2014: ALT 9; TSH 1.51 08/25/2014: Pro B Natriuretic peptide (BNP) 179.0* 11/20/2014: BUN 23; Creatinine 1.11; Hemoglobin 13.0; Platelets 170.0; Potassium 3.9;  Sodium 135   Lipid Panel     Component Value  Date/Time   CHOL 112 08/21/2014 0919   TRIG 104.0 08/21/2014 0919   TRIG 180* 06/21/2006 0735   HDL 34.90* 08/21/2014 0919   CHOLHDL 3 08/21/2014 0919   CHOLHDL 4.5 CALC 06/21/2006 0735   VLDL 20.8 08/21/2014 0919   LDLCALC 56 08/21/2014 0919   LDLDIRECT 158.5 07/19/2010 1626   LDLDIRECT 109.9 05/16/2006 0736      Wt Readings from Last 3 Encounters:  11/20/14 195 lb (88.451 kg)  09/01/14 192 lb (87.091 kg)  08/21/14 197 lb (89.359 kg)     Cardiac Studies Reviewed: 2D Echo 3.23.2015: Study Conclusions  - Left ventricle: Poor image quality Contrast helped a bit. Inferior wall appears severely hypokinetic The cavity size was moderately dilated. The estimated ejection fraction was 40%. - Mitral valve: Calcified annulus. Mildly thickened leaflets . Mild regurgitation. - Left atrium: The atrium was moderately dilated. - Atrial septum: No defect or patent foramen ovale was identified. - Pulmonary arteries: PA peak pressure: 19m Hg (S).  ASSESSMENT AND PLAN: 1.  Chronic systolic heart failure, New York Heart Association functional class III: The patient appears remarkably stable at this time. His functional capacity is limited from a multitude of medical problems. He does not have symptoms of shortness of breath at rest, orthopnea, or PND. His medical program was reviewed and will be continued without change. As noted above, hospice has been extremely helpful in controlling his heart failure, associated anxiety, and frequent hospitalizations.  2. CAD status post CABG: Occasional angina, controlled with nitroglycerin as needed. No medication changes made today.  3. CKD stage III: Stable by most recent labs.  4. Long-standing diabetes with complications: Followed by Dr. ELoanne Drilling Appears stable.   Current medicines are reviewed with the patient today.  The patient does not have concerns regarding medicines.  Labs/ tests ordered today include:  No orders of the defined  types were placed in this encounter.    Disposition:   FU 6 months  Signed, CSherren Mocha MD  12/29/2014 9:40 AM    [facility] [address] [place]  [address]Phone: [phone] Fax: [phone]

## 2014-12-29 NOTE — Patient Instructions (Signed)
Medication Instructions:  Your physician recommends that you continue on your current medications as directed. Please refer to the Current Medication list given to you today.  Labwork: No new orders.   Testing/Procedures: No new orders.   Follow-Up: Your physician wants you to follow-up in: 6 MONTHS with Dr Cooper.  You will receive a reminder letter in the mail two months in advance. If you don't receive a letter, please call our office to schedule the follow-up appointment.   Any Other Special Instructions Will Be Listed Below (If Applicable).   

## 2014-12-29 NOTE — Telephone Encounter (Signed)
Left voicemail advising patients wife we needed the report from Dr. Dahlia Bailiff office. Left our fax number for patient to give to Dr. Dahlia Bailiff office.

## 2014-12-30 ENCOUNTER — Encounter: Payer: Self-pay | Admitting: Internal Medicine

## 2014-12-30 ENCOUNTER — Ambulatory Visit (INDEPENDENT_AMBULATORY_CARE_PROVIDER_SITE_OTHER): Admitting: *Deleted

## 2014-12-30 DIAGNOSIS — I442 Atrioventricular block, complete: Secondary | ICD-10-CM

## 2014-12-30 DIAGNOSIS — I5022 Chronic systolic (congestive) heart failure: Secondary | ICD-10-CM | POA: Diagnosis not present

## 2014-12-30 DIAGNOSIS — I5023 Acute on chronic systolic (congestive) heart failure: Secondary | ICD-10-CM

## 2014-12-30 LAB — CUP PACEART REMOTE DEVICE CHECK
Battery Remaining Percentage: 90 %
Brady Statistic AP VS Percent: 1 %
Brady Statistic AS VP Percent: 29 %
Lead Channel Impedance Value: 490 Ohm
Lead Channel Impedance Value: 540 Ohm
Lead Channel Pacing Threshold Amplitude: 0.75 V
Lead Channel Pacing Threshold Pulse Width: 0.5 ms
Lead Channel Pacing Threshold Pulse Width: 0.5 ms
Lead Channel Sensing Intrinsic Amplitude: 10.4 mV
Lead Channel Setting Pacing Amplitude: 2 V
Lead Channel Setting Pacing Amplitude: 2.5 V
Lead Channel Setting Pacing Amplitude: 2.5 V
Lead Channel Setting Pacing Pulse Width: 0.5 ms
Lead Channel Setting Pacing Pulse Width: 0.5 ms
MDC IDC MSMT BATTERY REMAINING LONGEVITY: 83 mo
MDC IDC MSMT BATTERY VOLTAGE: 2.98 V
MDC IDC MSMT LEADCHNL LV IMPEDANCE VALUE: 940 Ohm
MDC IDC MSMT LEADCHNL LV PACING THRESHOLD AMPLITUDE: 1.25 V
MDC IDC MSMT LEADCHNL LV PACING THRESHOLD PULSEWIDTH: 0.5 ms
MDC IDC MSMT LEADCHNL RA PACING THRESHOLD AMPLITUDE: 1 V
MDC IDC MSMT LEADCHNL RA SENSING INTR AMPL: 4.3 mV
MDC IDC PG SERIAL: 7555472
MDC IDC SESS DTM: 20160525060024
MDC IDC SET LEADCHNL RV SENSING SENSITIVITY: 5 mV
MDC IDC STAT BRADY AP VP PERCENT: 70 %
MDC IDC STAT BRADY AS VS PERCENT: 1 %
MDC IDC STAT BRADY RA PERCENT PACED: 69 %
Pulse Gen Model: 3242

## 2014-12-30 NOTE — Telephone Encounter (Signed)
Please advise pt of this.  We're happy to consider any referral, though.

## 2014-12-30 NOTE — Telephone Encounter (Signed)
Patient's wife advised of note below and voiced understanding.  

## 2014-12-30 NOTE — Telephone Encounter (Signed)
please call dr rankin's office:  i reviewed note.  It makes no mention of this.  Does he want pt referred to neuro rehab for low vision.

## 2014-12-30 NOTE — Telephone Encounter (Signed)
I contacted Dr. Dahlia Bailiff office and was advised by the receptionist she could not locate where a neuro rehab referral was mentioned. The only referral that she found was a referral to services of the blind and that is a referral Dr. Dahlia Bailiff office would initiate

## 2014-12-30 NOTE — Progress Notes (Signed)
Remote pacemaker transmission.   

## 2014-12-30 NOTE — Telephone Encounter (Signed)
Report from Dr. Zadie Rhine received. Placed on Dr. Cordelia Pen desk to be reviewed.

## 2015-01-01 ENCOUNTER — Other Ambulatory Visit: Payer: Self-pay | Admitting: Endocrinology

## 2015-01-06 ENCOUNTER — Other Ambulatory Visit: Payer: Self-pay | Admitting: Cardiovascular Disease

## 2015-01-06 ENCOUNTER — Telehealth: Payer: Self-pay | Admitting: Internal Medicine

## 2015-01-06 NOTE — Telephone Encounter (Signed)
Informed pt wife that transmission was received. Forwarded call to device tech to have pt wife question answered.

## 2015-01-06 NOTE — Telephone Encounter (Signed)
Pt having stabbing sensation at device pocket. I informed him (& spouse) those sensations can occur intermittently post device implant due to nerve damage. I suggested pt shift his sitting or laying positions to alleviate the sensations. Pt & spouse expressed understanding.   Pt did have concerns about 5-6 lb drop in weight within 2-3 days. Pt also has a hospice nurse who visits regularly. I advised them to alert the device clinic if he has a sudden weight gain of 4-5 lbs in a 2-3 day period. I also advised pt to discuss weight loss with his hospice nurse. Pt & spouse expressed understanding.

## 2015-01-06 NOTE — Telephone Encounter (Signed)
New Message  Pt wife calling to see if we had received remote transmission from yesterday @ 5pm (5/31). Pt c/o of pain near device site and sent remote signal. Please call back and discuss.

## 2015-01-07 ENCOUNTER — Telehealth: Payer: Self-pay | Admitting: Endocrinology

## 2015-01-07 NOTE — Telephone Encounter (Signed)
Patient is returning your call.  

## 2015-01-07 NOTE — Telephone Encounter (Signed)
I contacted the patient's wife and advised the referral had been faxed on 5/27. Wife requested the referral be sent again. Referral re-faxed on 6/2.

## 2015-01-11 ENCOUNTER — Other Ambulatory Visit: Payer: Self-pay | Admitting: Internal Medicine

## 2015-01-12 ENCOUNTER — Other Ambulatory Visit: Payer: Self-pay | Admitting: Internal Medicine

## 2015-01-12 ENCOUNTER — Other Ambulatory Visit: Payer: Self-pay | Admitting: Endocrinology

## 2015-01-13 ENCOUNTER — Encounter: Payer: Self-pay | Admitting: Cardiology

## 2015-02-04 ENCOUNTER — Other Ambulatory Visit: Payer: Self-pay | Admitting: Cardiovascular Disease

## 2015-02-04 ENCOUNTER — Telehealth: Payer: Self-pay | Admitting: Internal Medicine

## 2015-02-04 NOTE — Telephone Encounter (Signed)
Form filled out through [email or web address], pending and waiting on a reply.

## 2015-02-05 NOTE — Telephone Encounter (Signed)
Received a faxed form from [facility] approving patient's medication (hyoscyamine) until 08/07/2015. Leighton and patient's wife  informed of the approval. Approval letter sent down to be scanned into the system.

## 2015-02-09 ENCOUNTER — Ambulatory Visit: Payer: Commercial Managed Care - HMO | Attending: Endocrinology | Admitting: Occupational Therapy

## 2015-02-09 DIAGNOSIS — H5410 Blindness, one eye, low vision other eye, unspecified eyes: Secondary | ICD-10-CM | POA: Insufficient documentation

## 2015-02-09 NOTE — Therapy (Signed)
[facility] [address] [place], [place], [address] Phone: [phone]   Fax:  [phone]  Occupational Therapy Evaluation  Patient Details  Name: Mark French MRN: [id number] Date of Birth: Jul 01, 1946 Referring Provider:  [name], MD  Encounter Date: 02/09/2015      OT End of Session - 02/09/15 1408    Visit Number 1   Number of Visits 4   Date for OT Re-Evaluation 04/10/15   Authorization Type [employer] MCR   OT Start Time 0925   OT Stop Time 1045   OT Time Calculation (min) 80 min      Past Medical History  Diagnosis Date  . CORONARY ARTERY DISEASE     a. s/p CABG 1998. b.  08/2011: inferior STEMI.  LHC 09/07/11: Severe 3v CAD, LIMA-LAD patent, SVG-RCA chronically occluded, SVG-circumflex chronically occluded, SVG-diagonal 95%. PCI: Promus DES to the SVG-diagonal.  Procedure c/b transient CHB req CPR and TTVP. c. Myoview 11/2011: scar but no ischemia.   [name] DIABETES MELLITUS, TYPE I   . HYPERTENSION   . 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 HYPERCHOLESTEROLEMIA   . CERVICAL RADICULOPATHY, LEFT   . PERIPHERAL NEUROPATHY   . Chronic systolic heart failure     Echocardiogram 08/09/13:Cavity size normal, EF 40%, inferior hypokinesis, grade 1 diastolic dysfunction, paradoxical septal motion, mild MR, mild LAE, no PFO, PAS 51, moderate pulm HTN  . ED (erectile dysfunction)   . Hypogonadism male   . GI bleed 2012    Multiple Duodenal ulcer   . Obesity   . Legally blind     bilaterally  . Stroke 55    age 72 months; residual right sided weakness  . History of blood transfusion 2012  . Charcot foot due to diabetes mellitus     chronic pain  . Osteoarthritis     right hip and shoulder  . COPD (chronic obstructive pulmonary disease)   . 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 Hiatal hernia   . Chronic stasis dermatitis     Chronic stasis changes.  . CKD (chronic kidney  disease), stage III   . Syncope     a. 07/2013: suspected due to hypotension.  . Biventricular cardiac pacemaker in situ     a. [address] Jude [employer] [place] RF model [id number] (serial number [id number])  . Complete heart block     a. s/p pacemaker placement on 08/2013 admission  . Duodenal ulcer     Past Surgical History  Procedure Laterality Date  . Carpal tunnel release Left   . Sigmoidoscopy  03/11/2001  . Venous doppler  01/30/2004  . Tonsillectomy and adenoidectomy      "as a kid"  . Cataract extraction w/ intraocular lens  implant, bilateral    . Coronary angioplasty with stent placement  08/2011    "1"  . Retinopathy surgery Bilateral 2000's    "laser; both eyes"  . Wrist fusion Right 1976  . Anterior fusion cervical spine  2010    "C spine; Dr. Ronnald Ramp"  . Ankle fracture surgery Left 1976; ?date    LEFT:  fused; removed bulk of hardware  . Heel spur surgery Left ~ 2007    ? left  . Irrigation and debridement knee  02/27/2012    Procedure: IRRIGATION AND DEBRIDEMENT KNEE;  Surgeon: Augustin Schooling, MD;  Location: [facility];  Service: Orthopedics;  Laterality: Right;  irrigation and drainage right knee septic bursitis  . Eye surgery  2008    cataract removal, cautery  . Coronary artery bypass graft  1998    CABG X5  . Fracture surgery      foot broken 1976  . Pacemaker insertion  08/08/2013    Biventricular pacemaker: [place] RF model [id number] (serial number [id number])  . Left heart catheterization with coronary/graft angiogram N/A 09/07/2011    Procedure: LEFT HEART CATHETERIZATION WITH [name];  Surgeon: Jettie Booze, MD;  Location: [facility] CATH LAB;  Service: Cardiovascular;  Laterality: N/A;  . Temporary pacemaker insertion  08/07/2013    Procedure: TEMPORARY PACEMAKER INSERTION;  Surgeon: Leonie Man, MD;  Location: [facility] CATH LAB;  Service: Cardiovascular;;  . Left heart catheterization with coronary/graft angiogram  08/07/2013    Procedure:  LEFT HEART CATHETERIZATION WITH [name];  Surgeon: Leonie Man, MD;  Location: [facility] CATH LAB;  Service: Cardiovascular;;  . Bi-ventricular pacemaker insertion N/A 08/08/2013    Procedure: BI-VENTRICULAR PACEMAKER INSERTION (CRT-P);  Surgeon: Coralyn Mark, MD;  Location: [facility] CATH LAB;  Service: Cardiovascular;  Laterality: N/A;  . Left heart catheterization with coronary/graft angiogram N/A 10/27/2013    Procedure: LEFT HEART CATHETERIZATION WITH [name];  Surgeon: Sinclair Grooms, MD;  Location: [facility] CATH LAB;  Service: Cardiovascular;  Laterality: N/A;    There were no vitals filed for this visit.  Visit Diagnosis:  Visual impairment: better eye severe, lesser eye profound, unspecified laterality - Plan: Ot plan of care cert/re-cert      Subjective Assessment - 02/09/15 0942    Subjective  Pt reports that his DM was undetected for a long time,    Pertinent History juvenille diabetes   Currently in Pain? Yes   Pain Score 5    Pain Location Hip  shoulder   Pain Type Chronic pain   Aggravating Factors  unknown    Pain Relieving Factors unkown   Multiple Pain Sites No           [facility] OT Assessment - 02/09/15 0001    Assessment   Diagnosis diabetic retinopathy   Onset Date 01/01/15   Assessment Pt reports a gradual decline in vision in the last 4-5 years due to diabetic retinopathy,. He reports a history of cataracts surgery    Prior Therapy PT   Precautions   Precautions Fall   Precaution Comments visual impairment   Balance Screen   Has the patient fallen in the past 6 months Yes   How many times? 3   Has the patient had a decrease in activity level because of a fear of falling?  Yes   Is the patient reluctant to leave their home because of a fear of falling?  Yes   Home  Environment   Family/patient expects to be discharged to: Private residence   Living Arrangements Spouse/significant other   [place] entrance   [place]  One level   Bathroom Accessibility Yes   How accessible Accessible via walker   [place] bars - toilet;Grab bars - tub/shower;Shower seat   Lives With Spouse   Prior Function   Level of Independence Needs assistance with gait;Needs assistance with ADLs   Vocation Retired   ADL   ADL comments wife assists with ADLs PRN.  Pt does not drive due to visual impairment   IADL   Financial Management Dependent  wife handles all finances   Mobility   Mobility Status Needs assist   Vision - History  Baseline Vision Wears glasses only for reading   Patient Visual Report Unable to keep objects in focus  blurry vision   Vision Assessment   Vision Assessment Vision tested   Right visual acuity tested 1 meter 20/300   Left Visual Acuity tested 1 meter 20/250   Reading Acuity 20/250   Cognition   Mini Mental State Exam  25/30  WFLS   ROM / Strength   AROM / PROM / Strength AROM   AROM   Overall AROM  Deficits   Overall AROM Comments Pt demonstrates limitations in bilateral UE A/ROM                  OT Treatments/Exercises (OP) - 2015/02/15 0001    Visual/Perceptual Exercises   Other Exercises Pt was educated in use of 5x stand magnifier with a light. Pt was able to read continuous text when using magnifier in conjuction with his prescription, reading glasses. Pt attempted use of 5x handheld magnifier, yet pt demonstrated significant difficulty  maintaining correct focal distance. Therapist applied hi marks to cell phone, and had pt practice using. Therapist showed pt CCTV, yet pt was not interested in pursuing. Pt/ wife report they have a family member who can enlarg the text on computer at home. Pt uses computer for emails and reading newpaper. Pt was provided with an indpendent living aids catalog to pursue AE PRN.               OT Education - 02/15/15 1435    Education provided Yes   Education Details use of 5x stand magnifier, hi marks for phone, recommended  larger text on computer(family to assist)   Person(s) Educated Patient;Spouse   Methods Explanation;Demonstration;Verbal cues;Handout   Comprehension Verbalized understanding;Returned demonstration;Verbal cues required                    Plan - Feb 15, 2015 1415    Clinical Impression Statement Pt with diabetic retinopathy, presents with visual impairments which impedes performance of ADLS/ IADLS.   Pt will benefit from skilled therapeutic intervention in order to improve on the following deficits (Retired) Impaired vision/preception   Rehab Potential Good   OT Frequency --  1-4 visits PRN   OT Duration 8 weeks   OT Treatment/Interventions Visual/perceptual remediation/compensation;Patient/family education;Self-care/ADL training;DME and/or AE instruction   Plan no additional OT recommended at this time. Treatment was performed on day of evaluation. No goals set at this time. (Therapist will set goals only if pt returns for additional therapy.)Pt was provided with an [place] catalog for AE.)   Recommended Other Services 5x stand magnifier   Consulted and Agree with Plan of Care Patient;Family member/caregiver   Family Member Consulted wife          G-Codes - 02/15/2015 1430    Functional Assessment Tool Used 5x magnifer / AE use for reading/ cell phone use   Functional Limitation Self care   Self Care Current Status [phone]) At least 20 percent but less than 40 percent impaired, limited or restricted   Self Care Goal Status ([id number]) At least 20 percent but less than 40 percent impaired, limited or restricted   Self Care Discharge Status [phone]) At least 20 percent but less than 40 percent impaired, limited or restricted      Problem List Patient Active Problem List   Diagnosis Date Noted  . Hypertensive cardiovascular disease 09/30/2014  . Hip pain 08/21/2014  . DNR (do not resuscitate) 10/29/2013  . ARF (  acute renal failure) 10/26/2013  . Acute on chronic  combined systolic and diastolic heart failure 75/64/3329  . Acute respiratory failure 10/25/2013  . DM (diabetes mellitus) type II controlled, neurological manifestation 10/25/2013  . NSTEMI (non-ST elevated myocardial infarction) 10/25/2013  . Chronic pain syndrome 09/05/2013  . Pacemaker 08/27/2013  . Hearing loss 08/21/2013  . Hypotension 08/18/2013  . Complete heart block 08/07/2013  . Mobitz type 2 second degree heart block 08/07/2013  . Acute coronary syndrome 08/07/2013  . Syncope and collapse 07/16/2013  . Abdominal pain, unspecified site 06/09/2013  . Syncope 03/27/2013  . Rib pain on left side 03/27/2013  . Obstructive lung disease 02/12/2013  . Numbness and tingling of both legs 11/12/2012  . Chronic kidney disease, stage III (moderate) 10/15/2012  . Chest pain 10/15/2012  . Left hip pain 09/24/2012  . Prostatism 06/25/2012  . Screening for prostate cancer 04/16/2012  . Constipation due to pain medication 02/29/2012  . Septic prepatellar bursitis of right knee 02/27/2012  . Cellulitis 02/27/2012  . Diabetes 02/08/2012  . Unstable angina 01/12/2012  . Acute on chronic systolic heart failure 51/88/4166  . Physical deconditioning 09/09/2011  . 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 LEG PAIN 07/19/2010  . Disorder resulting from impaired renal function 03/15/2010  . Edema 04/26/2009  . PROLIFERATIVE DIABETIC RETINOPATHY 03/09/2009  . MYALGIA 03/09/2009  . HYPERCHOLESTEROLEMIA 09/28/2008  . HYPOGONADISM 09/23/2008  . Iron deficiency anemia 09/23/2008  . DEPRESSION 09/23/2008  . PERIPHERAL NEUROPATHY 09/23/2008  . MYOCARDIAL INFARCTION, HX OF 09/23/2008  . Chronic systolic heart failure 02/04/1600  . ALLERGIC RHINITIS 09/23/2008  . CERVICAL RADICULOPATHY, LEFT 09/23/2008  . CEREBROVASCULAR ACCIDENT, HX OF 09/23/2008  . BLINDNESS 05/21/2008  . ARTHROPATHY ASSOCIATED W/NEUROLOGICAL DISORDERS 10/24/2007  . ULCER, LEG 07/30/2007  .  HYPERTENSION 03/11/2007  . Coronary atherosclerosis of autologous vein bypass graft 03/11/2007    Natasha Burda 02/09/2015, 2:37 PM Theone Murdoch, OTR/L [phone] Phone: [phone] 2:37 PM 02/09/2015 [facility] [address] [place], [place], [address] Phone: [phone]   Fax:  [phone]

## 2015-02-19 ENCOUNTER — Ambulatory Visit (INDEPENDENT_AMBULATORY_CARE_PROVIDER_SITE_OTHER): Payer: Medicare Other | Admitting: Endocrinology

## 2015-02-19 ENCOUNTER — Encounter: Payer: Self-pay | Admitting: Endocrinology

## 2015-02-19 VITALS — BP 114/62 | HR 62 | Temp 97.5°F | Wt 192.0 lb

## 2015-02-19 DIAGNOSIS — I739 Peripheral vascular disease, unspecified: Secondary | ICD-10-CM

## 2015-02-19 DIAGNOSIS — E1149 Type 2 diabetes mellitus with other diabetic neurological complication: Secondary | ICD-10-CM

## 2015-02-19 LAB — POCT GLYCOSYLATED HEMOGLOBIN (HGB A1C): Hemoglobin A1C: 6

## 2015-02-19 MED ORDER — METHADONE HCL 5 MG PO TABS: 5.0000 mg | ORAL_TABLET | Freq: Two times a day (BID) | ORAL | Status: DC

## 2015-02-19 MED ORDER — HYDROCODONE-ACETAMINOPHEN 10-325 MG PO TABS: 1.0000 | ORAL_TABLET | ORAL | Status: DC | PRN

## 2015-02-19 NOTE — Patient Instructions (Addendum)
Let's check a circulation test, of the legs.  check your blood sugar twice a day.  vary the time of day when you check, between before the 3 meals, and at bedtime.  also check if you have symptoms of your blood sugar being too high or too low.  please keep a record of the readings and bring it to your next appointment here.  You can write it on any piece of paper.  please call us sooner if your blood sugar goes below 70, or if you have a lot of readings over 200. Please continue the same pain medications.  Please reduce the insulin to 5 units each morning.   Please come back for a follow-up appointment in 3 months.

## 2015-02-19 NOTE — Progress Notes (Signed)
Subjective:    Patient ID: Mark French, male    DOB: 08-26-1945, 69 y.o.   MRN: [id number]  HPI History is from patient and wife.   Pt returns for f/u of diabetes mellitus: DM type: Insulin-requiring type 2 Dx'ed: 0223 Complications: polyneuropathy, CAD, renal insufficiency, and retinopathy. Therapy: insulin since 1998.  DKA: never.  Severe hypoglycemia: never.   Pancreatitis: never.   Other: therapy has been limited by pt's need for a simple, inexpensive insulin regimen; he has been d/c'ed from hospice care, due to stability in his health.   Interval history: wife says cbg's are in the 100's.   He denies hypoglycemia.   Chronic pain at the left shoulder is well-controlled Past Medical History  Diagnosis Date  . CORONARY ARTERY DISEASE     a. s/p CABG 1998. b.  08/2011: inferior STEMI.  LHC 09/07/11: Severe 3v CAD, LIMA-LAD patent, SVG-RCA chronically occluded, SVG-circumflex chronically occluded, SVG-diagonal 95%. PCI: Promus DES to the SVG-diagonal.  Procedure c/b transient CHB req CPR and TTVP. c. Myoview 11/2011: scar but no ischemia.   [name] DIABETES MELLITUS, TYPE I   . HYPERTENSION   . 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 HYPERCHOLESTEROLEMIA   . CERVICAL RADICULOPATHY, LEFT   . PERIPHERAL NEUROPATHY   . Chronic systolic heart failure     Echocardiogram 08/09/13:Cavity size normal, EF 40%, inferior hypokinesis, grade 1 diastolic dysfunction, paradoxical septal motion, mild MR, mild LAE, no PFO, PAS 51, moderate pulm HTN  . ED (erectile dysfunction)   . Hypogonadism male   . GI bleed 2012    Multiple Duodenal ulcer   . Obesity   . Legally blind     bilaterally  . Stroke 2    age 100 months; residual right sided weakness  . History of blood transfusion 2012  . Charcot foot due to diabetes mellitus     chronic pain  . Osteoarthritis     right hip and shoulder  . COPD (chronic obstructive pulmonary disease)   . PERIPHERAL VASCULAR INSUFFICIENCY,  LEGS, BILATERAL  08/17/2010  . Hiatal hernia   . Chronic stasis dermatitis     Chronic stasis changes.  . CKD (chronic kidney disease), stage III   . Syncope     a. 07/2013: suspected due to hypotension.  . Biventricular cardiac pacemaker in situ     a. [address] Jude Medical [place] RF model [id number] (serial number [id number])  . Complete heart block     a. s/p pacemaker placement on 08/2013 admission  . Duodenal ulcer     Past Surgical History  Procedure Laterality Date  . Carpal tunnel release Left   . Sigmoidoscopy  03/11/2001  . Venous doppler  01/30/2004  . Tonsillectomy and adenoidectomy      "as a kid"  . Cataract extraction w/ intraocular lens  implant, bilateral    . Coronary angioplasty with stent placement  08/2011    "1"  . Retinopathy surgery Bilateral 2000's    "laser; both eyes"  . Wrist fusion Right 1976  . Anterior fusion cervical spine  2010    "C spine; Dr. Ronnald Ramp"  . Ankle fracture surgery Left 1976; ?date    LEFT:  fused; removed bulk of hardware  . Heel spur surgery Left ~ 2007    ? left  . Irrigation and debridement knee  02/27/2012    Procedure: IRRIGATION AND DEBRIDEMENT KNEE;  Surgeon: Augustin Schooling, MD;  Location: [facility];  Service: Orthopedics;  Laterality: Right;  irrigation and drainage right knee septic bursitis  . Eye surgery  2008    cataract removal, cautery  . Coronary artery bypass graft  1998    CABG X5  . Fracture surgery      foot broken 1976  . Pacemaker insertion  08/08/2013    Biventricular pacemaker: [place] RF model [id number] (serial number [id number])  . Left heart catheterization with coronary/graft angiogram N/A 09/07/2011    Procedure: LEFT HEART CATHETERIZATION WITH [name];  Surgeon: Jettie Booze, MD;  Location: [facility] CATH LAB;  Service: Cardiovascular;  Laterality: N/A;  . Temporary pacemaker insertion  08/07/2013    Procedure: TEMPORARY PACEMAKER INSERTION;  Surgeon: Leonie Man, MD;  Location: [facility] CATH LAB;   Service: Cardiovascular;;  . Left heart catheterization with coronary/graft angiogram  08/07/2013    Procedure: LEFT HEART CATHETERIZATION WITH [name];  Surgeon: Leonie Man, MD;  Location: [facility] CATH LAB;  Service: Cardiovascular;;  . Bi-ventricular pacemaker insertion N/A 08/08/2013    Procedure: BI-VENTRICULAR PACEMAKER INSERTION (CRT-P);  Surgeon: Coralyn Mark, MD;  Location: [facility] CATH LAB;  Service: Cardiovascular;  Laterality: N/A;  . Left heart catheterization with coronary/graft angiogram N/A 10/27/2013    Procedure: LEFT HEART CATHETERIZATION WITH [name];  Surgeon: Sinclair Grooms, MD;  Location: [facility] CATH LAB;  Service: Cardiovascular;  Laterality: N/A;    History   Social History  . Marital Status: Married    Spouse Name: N/A  . Number of Children: 0  . Years of Education: N/A   Occupational History  . Disabled    Social History Main Topics  . Smoking status: Never Smoker   . Smokeless tobacco: Never Used  . Alcohol Use: No  . Drug Use: No  . Sexual Activity: No   Other Topics Concern  . Not on file   Social History Narrative   Comes to appointments with wife      0 Caffeine drinks daily     Current Outpatient Prescriptions on File Prior to Visit  Medication Sig Dispense Refill  . AMBULATORY NON FORMULARY MEDICATION Pt on O2 - 2 liter    . aspirin EC 81 MG tablet Take 81 mg by mouth at bedtime.    [name] atorvastatin (LIPITOR) 40 MG tablet Take 1 tablet (40 mg total) by mouth daily. 30 tablet 6  . Blood Glucose Monitoring Suppl (BAYER BREEZE 2 SYSTEM) W/DEVICE KIT 1 Device by Does not apply route once. 1 each 0  . calcitRIOL (ROCALTROL) 0.25 MCG capsule TAKE 1 CAPSULE BY MOUTH EVERY DAY 30 capsule 0  . carvedilol (COREG) 3.125 MG tablet TAKE 1 TABLET BY MOUTH TWICE DAILY WITH A MEAL 60 tablet 5  . clopidogrel (PLAVIX) 75 MG tablet Take 1 tablet (75 mg total) by mouth daily with breakfast. 30 tablet 6  . co-enzyme Q-10 30 MG capsule  Take 30 mg by mouth daily. @@ 2pm    . dicyclomine (BENTYL) 20 MG tablet Take 1 tablet (20 mg total) by mouth 2 (two) times daily. 30 tablet 0  . docusate sodium (COLACE) 100 MG capsule Take 300 mg by mouth daily.     . famotidine (PEPCID) 20 MG tablet Take 20 mg by mouth before cath procedure. stomach    . furosemide (LASIX) 40 MG tablet Take 80 mg by mouth 2 (two) times daily.    [name] gabapentin (NEURONTIN) 400 MG capsule Take 2 capsules (800 mg total) by mouth 3 (three) times daily. [place]  capsule 3  . hyoscyamine (LEVSIN SL) 0.125 MG SL tablet DISSOLVE 1 TABLET UNDER THE TONGUE EVERY 4 HOURS AS NEEDED 120 tablet 0  . insulin NPH Human (HUMULIN N,NOVOLIN N) 100 UNIT/ML injection Inject 5 Units into the skin daily before breakfast.     . isosorbide mononitrate (IMDUR) 30 MG 24 hr tablet TAKE 1 TABLET BY MOUTH EVERY DAY 30 tablet 6  . LORazepam (ATIVAN) 0.5 MG tablet Take 0.5 mg by mouth every 6 (six) hours as needed for anxiety (anxiety).     [name] losartan (COZAAR) 50 MG tablet TAKE 1/2 TABLET BY MOUTH EVERY DAY 45 tablet 3  . Morphine Sulfate (MORPHINE CONCENTRATE) 10 mg / 0.5 ml concentrated solution Place 0.25-0.5 mLs (5-10 mg total) under the tongue every 2 (two) hours as needed for moderate pain, severe pain, anxiety or shortness of breath. 15 mL 0  . nitroGLYCERIN (NITROSTAT) 0.4 MG SL tablet Place 1 tablet (0.4 mg total) under the tongue every 5 (five) minutes as needed for chest pain. 25 tablet 5  . OVER THE COUNTER MEDICATION Place 1 drop into both eyes daily as needed (dry eyes). [facility] eye drops    . pantoprazole (PROTONIX) 40 MG tablet TAKE 1 TABLET BY MOUTH TWICE DAILY 60 tablet 3  . polyethylene glycol powder (GLYCOLAX/MIRALAX) powder Take 0.5 Containers by mouth daily.   1  . potassium chloride SA (K-DUR,KLOR-CON) 20 MEQ tablet TAKE 1 TABLET BY MOUTH TWICE DAILY 60 tablet 5  . tamsulosin (FLOMAX) 0.4 MG CAPS capsule TAKE ONE CAPSULE BY MOUTH DAILY 30 capsule 0  . tolnaftate (ANTI-FUNGAL)  1 % powder Apply 1 application topically 3 (three) times a week. As needed for infection     No current facility-administered medications on file prior to visit.    Allergies  Allergen Reactions  . Sildenafil Nausea Only and Other (See Comments)    "took it once; heart went to pieces; never took it again"  . Percocet [Oxycodone-Acetaminophen] Other (See Comments)    Unknown reaction-possible altered mental state-per wife.    . Contrast Media [Iodinated Diagnostic Agents] Nausea Only    Family History  Problem Relation Age of Onset  . CAD Mother     Coronary Artery Disease  . CAD Father     Coronary Artery Disease  . CAD Paternal Grandmother     Coronary Artery Disease  . Colon cancer Neg Hx   . Diabetes Maternal Grandmother   . Cancer Maternal Grandmother     male cancer ? type  . Diabetes Brother     BP 114/62 mmHg  Pulse 62  Temp(Src) 97.5 F (36.4 C)  Wt 192 lb (87.091 kg)  SpO2 97%   Review of Systems He has lost a few lbs.  No hypoglycemia.    Objective:   Physical Exam VITAL SIGNS:  See vs page.   GENERAL: no distress.  In scooter. Ext: 1+ bilat leg edema. the left great toe is absent. There is bilateral onychomycosis. no deformity.  Skin: There is (chronic) hyperpigmentation of the legs. There are multiple abrasions of the anterior tibial areas. No drainage from any of these. no ulcer on the feet, but skin is dry. normal temp.  Pulses: dorsalis pedis intact bilat.  Neuro: sensation is intact to touch on the feet, but decreased from normal.    A1c=6.0%    Assessment & Plan:  DM: overcontrolled, given this regimen, which does match insulin to her changing needs throughout the day. Chronic pain syndrome: well-controlled.  Patient is advised the following: Patient Instructions  Let's check a circulation test, of the legs.  check your blood sugar twice a day.  vary the time of day when you check, between before the 3 meals, and at bedtime.  also  check if you have symptoms of your blood sugar being too high or too low.  please keep a record of the readings and bring it to your next appointment here.  You can write it on any piece of paper.  please call us sooner if your blood sugar goes below 70, or if you have a lot of readings over 200. Please continue the same pain medications.  Please reduce the insulin to 5 units each morning.   Please come back for a follow-up appointment in 3 months.

## 2015-02-21 ENCOUNTER — Other Ambulatory Visit: Payer: Self-pay | Admitting: Endocrinology

## 2015-02-24 ENCOUNTER — Other Ambulatory Visit: Payer: Self-pay | Admitting: Cardiovascular Disease

## 2015-02-25 ENCOUNTER — Encounter (HOSPITAL_COMMUNITY): Payer: Commercial Managed Care - HMO

## 2015-02-25 ENCOUNTER — Telehealth: Payer: Self-pay | Admitting: Endocrinology

## 2015-02-25 DIAGNOSIS — I25810 Atherosclerosis of coronary artery bypass graft(s) without angina pectoris: Secondary | ICD-10-CM

## 2015-02-25 DIAGNOSIS — I252 Old myocardial infarction: Secondary | ICD-10-CM

## 2015-02-25 NOTE — Telephone Encounter (Signed)
done

## 2015-02-25 NOTE — Telephone Encounter (Signed)
See note below and please advise, Thanks! 

## 2015-02-25 NOTE — Telephone Encounter (Signed)
Patients wife called stating that Mr. Mark French will need a referral to [facility] for his [place] appointment   Appointment: July 26th 2016  Thank you

## 2015-03-01 ENCOUNTER — Other Ambulatory Visit: Payer: Self-pay | Admitting: Internal Medicine

## 2015-03-02 ENCOUNTER — Telehealth: Payer: Self-pay | Admitting: Cardiovascular Disease

## 2015-03-02 ENCOUNTER — Ambulatory Visit (HOSPITAL_COMMUNITY): Payer: Commercial Managed Care - HMO | Attending: Cardiovascular Disease

## 2015-03-02 DIAGNOSIS — I2510 Atherosclerotic heart disease of native coronary artery without angina pectoris: Secondary | ICD-10-CM | POA: Diagnosis not present

## 2015-03-02 DIAGNOSIS — E11622 Type 2 diabetes mellitus with other skin ulcer: Secondary | ICD-10-CM | POA: Diagnosis present

## 2015-03-02 DIAGNOSIS — I739 Peripheral vascular disease, unspecified: Secondary | ICD-10-CM | POA: Diagnosis not present

## 2015-03-02 DIAGNOSIS — I10 Essential (primary) hypertension: Secondary | ICD-10-CM | POA: Diagnosis not present

## 2015-03-02 DIAGNOSIS — Z951 Presence of aortocoronary bypass graft: Secondary | ICD-10-CM | POA: Insufficient documentation

## 2015-03-02 DIAGNOSIS — Z8673 Personal history of transient ischemic attack (TIA), and cerebral infarction without residual deficits: Secondary | ICD-10-CM | POA: Insufficient documentation

## 2015-03-02 NOTE — Telephone Encounter (Signed)
New Message    Pt wife is calling and she wants to know if his oxygen can be ordered  Please give wife a call back

## 2015-03-02 NOTE — Telephone Encounter (Signed)
I spoke with the pt's wife and she said the pt already has oxygen in the home supplied by [facility].  The pt does not have any small tanks to use when going outside of the home (she referred to them as D tanks). The pt was recently discharged from [facility] and they had helped the pt with this type of situation.  I made her aware that I will contact [facility] and try to arrange for smaller oxygen tanks to be available for the pt.

## 2015-03-03 ENCOUNTER — Other Ambulatory Visit: Payer: Self-pay | Admitting: Endocrinology

## 2015-03-03 DIAGNOSIS — I739 Peripheral vascular disease, unspecified: Secondary | ICD-10-CM

## 2015-03-03 NOTE — Telephone Encounter (Signed)
I spoke with [facility] and they reviewed the pt's order and the pt already has D and E tanks in the home.  The M6 is a smaller tank. A respiratory therapist can go into the pt's home and evaluate the pt for a smaller tank if they receive an order that states "best fit evaluation for possible conserving regulator use".  This order can be faxed to [phone].

## 2015-03-04 ENCOUNTER — Telehealth: Payer: Self-pay | Admitting: Cardiovascular Disease

## 2015-03-04 ENCOUNTER — Other Ambulatory Visit: Payer: Self-pay

## 2015-03-04 NOTE — Telephone Encounter (Signed)
New message      Dr Loanne Drilling want Mark French to contact a vascular surgeon.  Pt had a test on last tues to check for blockage on his legs.  They thought the test was normal----now they are surprised to get a call to talk about surgery.  Can you clear up the confusion?

## 2015-03-04 NOTE — Telephone Encounter (Signed)
Reviewed information with Dr Burt Knack and at this time referral to a Vascular Specialist is not recommended.  The pt is being treated conservatively and has been involved in [place].  I made the pt's wife aware of this information and this was also her understanding and she wanted to verify that she was correct.  She will let Dr Cordelia Pen office know that they are not going to see a specialist.

## 2015-03-04 NOTE — Telephone Encounter (Signed)
Pt is very confused as to why he needs a referral to go see the vascular surgeon.He does not understand at all although he did have a doppler done on Tuesday and the report was not so good.Please provide me clarification on what to tell the pt.

## 2015-03-04 NOTE — Telephone Encounter (Signed)
Order faxed to [facility] for respiratory therapy evaluation. Pt's wife aware.

## 2015-03-05 NOTE — Telephone Encounter (Signed)
Pt stated that he would talk to Dr.Copper more about this and get his second opinion.

## 2015-03-05 NOTE — Telephone Encounter (Signed)
The blood flow was ok, but no as good as it should be.  If your feet were fine, i would say just recheck this test in 1 year.  But since your feet have some ulcers, please see the specialist.  This is not to say you need surgery, but i want you to have the best information possible.

## 2015-03-08 ENCOUNTER — Telehealth: Payer: Self-pay | Admitting: Endocrinology

## 2015-03-08 NOTE — Telephone Encounter (Signed)
Patients wife called and would like to know who the Vascular Surgeon is?   Please advise    Thank you

## 2015-03-08 NOTE — Telephone Encounter (Signed)
Told the pt that Dr.Copper is a Hydrographic surveyor in network with [facility]

## 2015-03-15 ENCOUNTER — Telehealth: Payer: Self-pay | Admitting: Cardiovascular Disease

## 2015-03-15 NOTE — Telephone Encounter (Signed)
Spoke with wife and she states [employer] says pt has to have an OV with notes stating pt needs O2 and has to have documented O2 level 88% or below to qualify. Informed wife that I believe this information may need to come from pt's PCP. Wife states that is fine, she just wasn't sure what to do. Informed her I would route information to Dr. Burt Knack for review and advisement.

## 2015-03-15 NOTE — Telephone Encounter (Signed)
New Message  Pt wife calling to spaek w/ RN about pt's Oxygen RX. Please call back and discuss.

## 2015-03-19 ENCOUNTER — Telehealth: Payer: Self-pay | Admitting: Endocrinology

## 2015-03-19 NOTE — Telephone Encounter (Signed)
In order for [facility] to continue to provide oxygen they need documentation from Dr. Loanne Drilling with testing that shows what his o2 levels are.

## 2015-03-19 NOTE — Telephone Encounter (Signed)
Ok, please schedule nurse visit to check 02 sat

## 2015-03-19 NOTE — Telephone Encounter (Signed)
I contacted the pt's wife. Pt wife stated the 02 saturation from the pulse ox is not the needed documentation. Pt is going to bring the paper work to his up coming office visit on 03/23/15 to have MD review and determine the course of action.

## 2015-03-19 NOTE — Telephone Encounter (Signed)
See note below and please advise, Thanks! 

## 2015-03-21 NOTE — Telephone Encounter (Signed)
Either is fine. He has chronic dyspnea and severe heart disease - clearly benefits from O2.

## 2015-03-22 ENCOUNTER — Telehealth: Payer: Self-pay | Admitting: Cardiovascular Disease

## 2015-03-22 ENCOUNTER — Telehealth: Payer: Self-pay | Admitting: Endocrinology

## 2015-03-22 NOTE — Telephone Encounter (Signed)
Spoke with wife and she states that they have received from paperwork from [facility] now and it states that the order must come from PCP. Wife states that pt has appt with Dr. Loanne Drilling tomorrow to review paperwork and see what they can do about getting him O2. Advised wife to let our office know if there is anything we can do to help her. Wife verbalized understanding and stated that she would call tomorrow after the appt if there was anything she needed from our office.

## 2015-03-22 NOTE — Telephone Encounter (Signed)
Pt c/o swelling: STAT is pt has developed SOB within 24 hours  1. How long have you been experiencing swelling? 03/19/2015... No longer under hospice   2. Where is the swelling located? Sternum near pacer   3.  Are you currently taking a "fluid pill"? Yes... Wife make sure he does that.   4.  Are you currently SOB? Has had to use oxygen within the last within the last 24 hours.   5.  Have you traveled recently? No   Comments; Pt called states that Friday 03/19/2015 pt fell and hit the bathroom sink and he hit his sternum near the pacer. Pt called states that he now has a 3-4 inch long bruise. Pt requests a call back to determine if he should go to the hospital or come in.[name]

## 2015-03-22 NOTE — Telephone Encounter (Signed)
Patient denies dizziness and chest pain. Appt made for device and site wound check 03/23/15 at 11:30.

## 2015-03-22 NOTE — Telephone Encounter (Signed)
Pt states when he hit his chest on the edge of the counter he lost his footing on the bathroom floor, he denies being "woozy", lightheadedness or dizziness, he did not pass out.  Pt advised I will forward to Dr Bess Kinds for review.

## 2015-03-22 NOTE — Telephone Encounter (Signed)
[facility] note dated 03/19/15 at 10:35 PM Initial Comment Caller states her husband hasn't had a bowel movement in over a week. PreDisposition Did not know what to do Care Advice Given Per Guideline SEE PHYSICIAN WITHIN 24 HOURS: * MIRALAX (polyethylene glycol 3350): Miralax is an 'osmotic' agent which means that it binds water and causes water to be retained within the stool. You can use this laxative to treat occasional constipation. Do not use for PLEASE NOTE: All timestamps contained within this report are represented as [place] Standard Time. CONFIDENTIALTY NOTICE: This fax transmission is intended only for the addressee. It contains information that is legally privileged, confidential or otherwise protected from use or disclosure. If you are not the intended recipient, you are strictly prohibited from reviewing, disclosing, copying using or disseminating any of this information or taking any action in reliance on or regarding this information. If you have received this fax in error, please notify us immediately by telephone so that we can arrange for its return to [name]. Phone: [phone], Toll-Free: [phone], Fax: [phone] Page: 2 of 2 Call Id: [id number] Care Advice Given Per Guideline more than 2 weeks without approval from your doctor. Generally, Miralax produces a bowel movement in 1 to 3 days. Side effects include diarrhea (especially at higher doses). If you are pregnant, discuss with your doctor before using. Available in the [place]. * MILK OF MAGNESIA (magnesium hydroxide): This is a mild and generally safe laxative. You can use Milk of Magnesia for short-term treatment of constipation. (Research suggests that Miralax may be more effective.) Dosage is 2 tablespoons (30 cc) PO. Do not use if you have kidney disease. OSMOTIC LAXATIVES: * IF OFFICE WILL BE OPEN: You need to be examined within the next 24 hours. Call your doctor when the office opens, and make an  appointment. * IF OFFICE WILL BE CLOSED AND NO PCP TRIAGE: You need to be examined within the next 24 hours. Go to _________ at your convenience. CALL BACK IF: * Constant abdominal pain lasting over 2 hours * Vomiting occurs * You become worse. * Severe or increasing abdominal pain CARE ADVICE given per Constipation (Adult) guideline. After Care Instructions Given Call Event Type User Date / Time Description Referrals [facility]

## 2015-03-22 NOTE — Telephone Encounter (Signed)
Pt's wife states this occurred about  3 days ago, pt's wife advised to call if soreness/tenderness does not continue to improve or any worsening changes in  symptoms.

## 2015-03-22 NOTE — Telephone Encounter (Signed)
Noted thanks °

## 2015-03-22 NOTE — Telephone Encounter (Signed)
See note below to be advised. Pt has a office visit tomorrow.

## 2015-03-22 NOTE — Telephone Encounter (Signed)
Pt gave verbal permission to speak to his wife. -Pt's wife states pt has appt with Dr Loanne Drilling tomorrow for pulse ox to document oxygen level.  Pt and pt's wife just wanted Dr Burt Knack and Ander Purpura to be aware of this.   Pt's wife states pt fell and hit his chest mid sternum against the edge of a counter top. Pt has some local tenderness and soreness but denies chest pain, shortness of breath, or other symptoms.  Pt's wife states pt did not hit his chest in the area of his pacemaker.

## 2015-03-22 NOTE — Telephone Encounter (Signed)
New message    Pt wife states pt trying to get oxygen approved through [place] but has not been approved at this time Pt wife states pt is out of Hospice care Pt is having some test done tomorrow Please call to discuss

## 2015-03-23 ENCOUNTER — Encounter: Payer: Self-pay | Admitting: Internal Medicine

## 2015-03-23 ENCOUNTER — Encounter (INDEPENDENT_AMBULATORY_CARE_PROVIDER_SITE_OTHER): Payer: Self-pay

## 2015-03-23 ENCOUNTER — Encounter: Payer: Self-pay | Admitting: Endocrinology

## 2015-03-23 ENCOUNTER — Ambulatory Visit
Admission: RE | Admit: 2015-03-23 | Discharge: 2015-03-23 | Disposition: A | Discharge status: Home / Self Care | Payer: Commercial Managed Care - HMO | Source: Ambulatory Visit | Attending: Endocrinology | Admitting: Endocrinology

## 2015-03-23 ENCOUNTER — Other Ambulatory Visit: Payer: Self-pay | Admitting: Endocrinology

## 2015-03-23 ENCOUNTER — Ambulatory Visit (INDEPENDENT_AMBULATORY_CARE_PROVIDER_SITE_OTHER): Payer: Commercial Managed Care - HMO | Admitting: Endocrinology

## 2015-03-23 ENCOUNTER — Ambulatory Visit (INDEPENDENT_AMBULATORY_CARE_PROVIDER_SITE_OTHER): Payer: Commercial Managed Care - HMO | Admitting: *Deleted

## 2015-03-23 VITALS — BP 116/62 | HR 67 | Temp 97.8°F | Ht 61.0 in | Wt 189.0 lb

## 2015-03-23 DIAGNOSIS — I442 Atrioventricular block, complete: Secondary | ICD-10-CM

## 2015-03-23 DIAGNOSIS — I5022 Chronic systolic (congestive) heart failure: Secondary | ICD-10-CM

## 2015-03-23 DIAGNOSIS — R0789 Other chest pain: Secondary | ICD-10-CM | POA: Diagnosis not present

## 2015-03-23 DIAGNOSIS — M545 Low back pain, unspecified: Secondary | ICD-10-CM | POA: Insufficient documentation

## 2015-03-23 LAB — CUP PACEART INCLINIC DEVICE CHECK
Battery Voltage: 2.98 V
Date Time Interrogation Session: 20160816122449
Lead Channel Impedance Value: 937.5 Ohm
Lead Channel Pacing Threshold Amplitude: 0.75 V
Lead Channel Pacing Threshold Amplitude: 1.5 V
Lead Channel Pacing Threshold Pulse Width: 0.5 ms
Lead Channel Setting Pacing Amplitude: 2 V
Lead Channel Setting Pacing Amplitude: 2.5 V
Lead Channel Setting Pacing Amplitude: 2.5 V
Lead Channel Setting Pacing Pulse Width: 0.5 ms
MDC IDC MSMT BATTERY REMAINING LONGEVITY: 88.8 mo
MDC IDC MSMT LEADCHNL LV PACING THRESHOLD PULSEWIDTH: 0.5 ms
MDC IDC MSMT LEADCHNL RA IMPEDANCE VALUE: 487.5 Ohm
MDC IDC MSMT LEADCHNL RA PACING THRESHOLD AMPLITUDE: 0.75 V
MDC IDC MSMT LEADCHNL RA SENSING INTR AMPL: 4.4 mV
MDC IDC MSMT LEADCHNL RV IMPEDANCE VALUE: 550 Ohm
MDC IDC MSMT LEADCHNL RV PACING THRESHOLD PULSEWIDTH: 0.5 ms
MDC IDC MSMT LEADCHNL RV SENSING INTR AMPL: 10.4 mV
MDC IDC PG SERIAL: 7555472
MDC IDC SET LEADCHNL RV PACING PULSEWIDTH: 0.5 ms
MDC IDC SET LEADCHNL RV SENSING SENSITIVITY: 5 mV
MDC IDC STAT BRADY RA PERCENT PACED: 74 %
MDC IDC STAT BRADY RV PERCENT PACED: 99.05 %
Pulse Gen Model: 3242

## 2015-03-23 NOTE — Progress Notes (Signed)
Pulse Oximetry Data:  2 minutes of exertion on 2 L of 02, oxygen saturation level: 97%  5 minutes without 02 therapy, oxygen saturation level: 98%  2 minutes of exertion without 02 therapy, oxygen saturation level: 93%

## 2015-03-23 NOTE — Patient Instructions (Addendum)
Based on today's oxygen, you don't qualify for oxygen.  X-rays are requested for you today.  We'll let you know about the results. Please see Dr Burt Knack as scheduled.

## 2015-03-23 NOTE — Progress Notes (Signed)
Subjective:    Patient ID: Mark French, male    DOB: 1945-10-16, 69 y.o.   MRN: [id number]  HPI Pt says he has sob with exertion, but he can't do much.   Pt states 2 weeks of moderate pain at the lower back, after a fall.  No assoc urinary retention Past Medical History  Diagnosis Date  . CORONARY ARTERY DISEASE     a. s/p CABG 1998. b.  08/2011: inferior STEMI.  LHC 09/07/11: Severe 3v CAD, LIMA-LAD patent, SVG-RCA chronically occluded, SVG-circumflex chronically occluded, SVG-diagonal 95%. PCI: Promus DES to the SVG-diagonal.  Procedure c/b transient CHB req CPR and TTVP. c. Myoview 11/2011: scar but no ischemia.   [name] DIABETES MELLITUS, TYPE I   . HYPERTENSION   . 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 HYPERCHOLESTEROLEMIA   . CERVICAL RADICULOPATHY, LEFT   . PERIPHERAL NEUROPATHY   . Chronic systolic heart failure     Echocardiogram 08/09/13:Cavity size normal, EF 40%, inferior hypokinesis, grade 1 diastolic dysfunction, paradoxical septal motion, mild MR, mild LAE, no PFO, PAS 51, moderate pulm HTN  . ED (erectile dysfunction)   . Hypogonadism male   . GI bleed 2012    Multiple Duodenal ulcer   . Obesity   . Legally blind     bilaterally  . Stroke 48    age 89 months; residual right sided weakness  . History of blood transfusion 2012  . Charcot foot due to diabetes mellitus     chronic pain  . Osteoarthritis     right hip and shoulder  . COPD (chronic obstructive pulmonary disease)   . 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 Hiatal hernia   . Chronic stasis dermatitis     Chronic stasis changes.  . CKD (chronic kidney disease), stage III   . Syncope     a. 07/2013: suspected due to hypotension.  . Biventricular cardiac pacemaker in situ     a. [address] [employer] [place] RF model [id number] (serial number [id number])  . Complete heart block     a. s/p pacemaker placement on 08/2013 admission  . Duodenal ulcer     Past Surgical History    Procedure Laterality Date  . Carpal tunnel release Left   . Sigmoidoscopy  03/11/2001  . Venous doppler  01/30/2004  . Tonsillectomy and adenoidectomy      "as a kid"  . Cataract extraction w/ intraocular lens  implant, bilateral    . Coronary angioplasty with stent placement  08/2011    "1"  . Retinopathy surgery Bilateral 2000's    "laser; both eyes"  . Wrist fusion Right 1976  . Anterior fusion cervical spine  2010    "C spine; Dr. Ronnald Ramp"  . Ankle fracture surgery Left 1976; ?date    LEFT:  fused; removed bulk of hardware  . Heel spur surgery Left ~ 2007    ? left  . Irrigation and debridement knee  02/27/2012    Procedure: IRRIGATION AND DEBRIDEMENT KNEE;  Surgeon: Augustin Schooling, MD;  Location: [facility];  Service: Orthopedics;  Laterality: Right;  irrigation and drainage right knee septic bursitis  . Eye surgery  2008    cataract removal, cautery  . Coronary artery bypass graft  1998    CABG X5  . Fracture surgery      foot broken 1976  . Pacemaker insertion  08/08/2013    Biventricular pacemaker: [place] RF model [id number] (serial number  [id number])  . Left heart catheterization with coronary/graft angiogram N/A 09/07/2011    Procedure: LEFT HEART CATHETERIZATION WITH [name];  Surgeon: Jettie Booze, MD;  Location: [facility] CATH LAB;  Service: Cardiovascular;  Laterality: N/A;  . Temporary pacemaker insertion  08/07/2013    Procedure: TEMPORARY PACEMAKER INSERTION;  Surgeon: Leonie Man, MD;  Location[facility] C[facility] LAB;  Service: Cardiovascular;;  . Left heart catheterization with coronary/graft angiogram  08/07/2013    Procedure: LEFT HEART CATHETERIZATION WITH [name];  Surgeon: Leonie Man, MD;  Location: [facility] CATH LAB;  Service: Cardiovascular;;  . Bi-ventricular pacemaker insertion N/A 08/08/2013    Procedure: BI-VENTRICULAR PACEMAKER INSERTION (CRT-P);  Surgeon: Coralyn Mark, MD;  Location: [facility] CATH LAB;  Service:  Cardiovascular;  Laterality: N/A;  . Left heart catheterization with coronary/graft angiogram N/A 10/27/2013    Procedure: LEFT HEART CATHETERIZATION WITH [name];  Surgeon: Sinclair Grooms, MD;  Location: [facility] CATH LAB;  Service: Cardiovascular;  Laterality: N/A;    Social History   Social History  . Marital Status: Married    Spouse Name: N/A  . Number of Children: 0  . Years of Education: N/A   Occupational History  . Disabled    Social History Main Topics  . Smoking status: Never Smoker   . Smokeless tobacco: Never Used  . Alcohol Use: No  . Drug Use: No  . Sexual Activity: No   Other Topics Concern  . Not on file   Social History Narrative   Comes to appointments with wife      0 Caffeine drinks daily     Current Outpatient Prescriptions on File Prior to Visit  Medication Sig Dispense Refill  . AMBULATORY NON FORMULARY MEDICATION Pt on O2 - 2 liter    . aspirin EC 81 MG tablet Take 81 mg by mouth at bedtime.    [name] atorvastatin (LIPITOR) 40 MG tablet Take 1 tablet (40 mg total) by mouth daily. 30 tablet 6  . Blood Glucose Monitoring Suppl (BAYER BREEZE 2 SYSTEM) W/DEVICE KIT 1 Device by Does not apply route once. 1 each 0  . calcitRIOL (ROCALTROL) 0.25 MCG capsule TAKE 1 CAPSULE BY MOUTH EVERY DAY 30 capsule 0  . carvedilol (COREG) 3.125 MG tablet TAKE 1 TABLET BY MOUTH TWICE DAILY WITH A MEAL 60 tablet 5  . clopidogrel (PLAVIX) 75 MG tablet TAKE 1 TABLET BY MOUTH EVERY DAY 30 tablet 11  . co-enzyme Q-10 30 MG capsule Take 30 mg by mouth daily. @@ 2pm    . dicyclomine (BENTYL) 20 MG tablet Take 1 tablet (20 mg total) by mouth 2 (two) times daily. 30 tablet 0  . docusate sodium (COLACE) 100 MG capsule Take 300 mg by mouth daily.     . famotidine (PEPCID) 20 MG tablet Take 20 mg by mouth before cath procedure. stomach    . furosemide (LASIX) 40 MG tablet TAKE 2 TABLETS BY MOUTH TWICE DAILY 120 tablet 3  . gabapentin (NEURONTIN) 400 MG capsule Take 2  capsules (800 mg total) by mouth 3 (three) times daily. 180 capsule 3  . HYDROcodone-acetaminophen (NORCO) 10-325 MG per tablet Take 1-2 tablets by mouth every 4 (four) hours as needed for moderate pain. 150 tablet 0  . hyoscyamine (LEVSIN SL) 0.125 MG SL tablet DISSOLVE 1 TABLET UNDER THE TONGUE EVERY 4 HOURS AS NEEDED 120 tablet 0  . insulin NPH Human (HUMULIN N,NOVOLIN N) 100 UNIT/ML injection Inject 5 Units into the skin daily before  breakfast.     . isosorbide mononitrate (IMDUR) 30 MG 24 hr tablet TAKE 1 TABLET BY MOUTH EVERY DAY 30 tablet 6  . LORazepam (ATIVAN) 0.5 MG tablet Take 0.5 mg by mouth every 6 (six) hours as needed for anxiety (anxiety).     [name] losartan (COZAAR) 50 MG tablet TAKE 1/2 TABLET BY MOUTH EVERY DAY 45 tablet 3  . methadone (DOLOPHINE) 5 MG tablet Take 1 tablet (5 mg total) by mouth 2 (two) times daily. 60 tablet 0  . Morphine Sulfate (MORPHINE CONCENTRATE) 10 mg / 0.5 ml concentrated solution Place 0.25-0.5 mLs (5-10 mg total) under the tongue every 2 (two) hours as needed for moderate pain, severe pain, anxiety or shortness of breath. 15 mL 0  . nitroGLYCERIN (NITROSTAT) 0.4 MG SL tablet Place 1 tablet (0.4 mg total) under the tongue every 5 (five) minutes as needed for chest pain. 25 tablet 5  . OVER THE COUNTER MEDICATION Place 1 drop into both eyes daily as needed (dry eyes). [facility] eye drops    . pantoprazole (PROTONIX) 40 MG tablet TAKE 1 TABLET BY MOUTH TWICE DAILY 60 tablet 3  . polyethylene glycol powder (GLYCOLAX/MIRALAX) powder Take 0.5 Containers by mouth daily.   1  . polyethylene glycol powder (GLYCOLAX/MIRALAX) powder DISSOLVE 9 GRAMS( 1/2 CAPFUL) IN AT LEAST 8 OUNCES OF WATER/ JUICE AND DRINK THREE TIMES WEEKLY 527 g 2  . potassium chloride SA (K-DUR,KLOR-CON) 20 MEQ tablet TAKE 1 TABLET BY MOUTH TWICE DAILY 60 tablet 5  . tolnaftate (ANTI-FUNGAL) 1 % powder Apply 1 application topically 3 (three) times a week. As needed for infection     No current  facility-administered medications on file prior to visit.    Allergies  Allergen Reactions  . Sildenafil Nausea Only and Other (See Comments)    "took it once; heart went to pieces; never took it again"  . Percocet [Oxycodone-Acetaminophen] Other (See Comments)    Unknown reaction-possible altered mental state-per wife.    . Contrast Media [Iodinated Diagnostic Agents] Nausea Only    Family History  Problem Relation Age of Onset  . CAD Mother     Coronary Artery Disease  . CAD Father     Coronary Artery Disease  . CAD Paternal Grandmother     Coronary Artery Disease  . Colon cancer Neg Hx   . Diabetes Maternal Grandmother   . Cancer Maternal Grandmother     male cancer ? type  . Diabetes Brother     BP 116/62 mmHg  Pulse 67  Temp(Src) 97.8 F (36.6 C) (Oral)  Ht 5' 1"  (1.549 m)  Wt 189 lb (85.73 kg)  BMI 35.73 kg/m2  SpO2 95%  Review of Systems Denies cough and bowel retention.      Objective:   Physical Exam VITAL SIGNS:  See vs page GENERAL: no distress.  In scooter Chest wall: large anterior ecchymosis.  Spine: nontender   X-rays: compression fracture, ? age    Assessment & Plan:  L-spine compression fracture, ? new Chest contusion, new.  Check CXR. CHF: he does not qualify for 02, based on saturation.    Patient is advised the following: Patient Instructions  Based on today's oxygen, you don't qualify for oxygen.  X-rays are requested for you today.  We'll let you know about the results. Please see Dr Burt Knack as scheduled.

## 2015-03-23 NOTE — Telephone Encounter (Signed)
See note below and please advise, Thanks! 

## 2015-03-23 NOTE — Telephone Encounter (Signed)
Can we please order the overnight oximetry for the pt to see if that shows he will qualify for oxygen

## 2015-03-23 NOTE — Progress Notes (Signed)
CRT-P device check in clinic for SOB and recent fall with injury to sternum. Ecchymosis noted at sternum- appears to be stable. Normal device function. Thresholds, sensing, impedance consistent with previous measurements. Histograms appropriate for patient and level of activity. 3 mode switches- longest 6 seconds, pk A 177. No ventricular high rate episodes. Patient bi-ventricularly pacing >99% of the time. Device programmed with appropriate safety margins. Device heart failure diagnostics are within normal limits and stable over time. Estimated longevity 7.5-8.2 years. Patient enrolled in remote follow-up. Merlin 06-22-15, ROV with [employer] in Feb.

## 2015-03-25 ENCOUNTER — Other Ambulatory Visit: Payer: Self-pay | Admitting: Internal Medicine

## 2015-03-31 ENCOUNTER — Encounter

## 2015-04-06 ENCOUNTER — Telehealth: Payer: Self-pay | Admitting: Endocrinology

## 2015-04-06 NOTE — Telephone Encounter (Signed)
See note below and please advise, Thanks! 

## 2015-04-06 NOTE — Telephone Encounter (Signed)
Ok, please order.

## 2015-04-06 NOTE — Telephone Encounter (Signed)
Please call wife she is wanting to know if a over night oxygen test has been ordered yet for [facility] , call [phone]

## 2015-04-06 NOTE — Telephone Encounter (Signed)
Order has been faxed back to [facility].

## 2015-04-13 ENCOUNTER — Telehealth: Payer: Self-pay | Admitting: Endocrinology

## 2015-04-13 MED ORDER — METHADONE HCL 5 MG PO TABS: 5.0000 mg | ORAL_TABLET | Freq: Two times a day (BID) | ORAL | Status: DC

## 2015-04-13 MED ORDER — HYDROCODONE-ACETAMINOPHEN 10-325 MG PO TABS: 1.0000 | ORAL_TABLET | ORAL | Status: DC | PRN

## 2015-04-13 NOTE — Telephone Encounter (Signed)
I contacted the pt's wife and advised Hydrocodone Rx is ready for pick up. Pt is also requesting a refill on Methadone. Please advise if this rx can be refilled. Thanks!

## 2015-04-13 NOTE — Telephone Encounter (Signed)
Patient need a refill methadone 5 mg Hydrocodone 10-325 call patient when ready.

## 2015-04-13 NOTE — Telephone Encounter (Signed)
See note below and please advise if ok to refill. Thanks!  

## 2015-04-13 NOTE — Telephone Encounter (Signed)
i printed 

## 2015-04-13 NOTE — Telephone Encounter (Signed)
Pt's wife advised Rx for Hydrocodone and Methadone is ready for pick up. Rx's placed up front.

## 2015-04-13 NOTE — Telephone Encounter (Signed)
please call [facility] agency: i have received oximetry result. If you can resend 02 request form, i'll do it with these results.

## 2015-04-14 ENCOUNTER — Other Ambulatory Visit: Payer: Self-pay

## 2015-04-14 NOTE — Telephone Encounter (Signed)
I contacted [facility] agency and requested the O2 form to be resent.

## 2015-04-16 ENCOUNTER — Telehealth: Payer: Self-pay

## 2015-04-16 ENCOUNTER — Telehealth: Payer: Self-pay | Admitting: Endocrinology

## 2015-04-16 MED ORDER — GABAPENTIN 400 MG PO CAPS: 800.0000 mg | ORAL_CAPSULE | Freq: Three times a day (TID) | ORAL | Status: AC

## 2015-04-16 NOTE — Telephone Encounter (Signed)
i have sent a prescription to your pharmacy 

## 2015-04-16 NOTE — Telephone Encounter (Addendum)
Stephanie with [facility] called about the pt's O2 form. She stated the office visit from 03/23/2015 needs an addendum stating the pt needs O2. The O2 form does not need to be completed. Please advise if we can make this changed thanks!

## 2015-04-16 NOTE — Telephone Encounter (Signed)
i have received the overnight oximetry from 04/08/15.  In my opinion, he needs supplemental oxygen.  For clarity, this statement is dated today, rather than an addendum to the 03/23/15 note.

## 2015-04-16 NOTE — Telephone Encounter (Signed)
Pt's wife notified rx has been sent.

## 2015-04-16 NOTE — Telephone Encounter (Signed)
See note below and please advise if ok to refill. Medication is listed under a different provider.

## 2015-04-16 NOTE — Telephone Encounter (Signed)
Patients wife called stating that she would like Mark French Rx sent to his pharmacy   Rx: Gabapentin   Pharmacy: [facility] / [address]    Thank you

## 2015-04-19 ENCOUNTER — Telehealth: Payer: Self-pay | Admitting: Endocrinology

## 2015-04-19 NOTE — Telephone Encounter (Addendum)
Colletta Maryland from [facility] would like for [facility] to please send a amendment for Mr. Vanwyhe oxygen   The deadline is by Friday   Please advise    Thank you   Call back: [phone] Ext [phone]

## 2015-04-20 ENCOUNTER — Other Ambulatory Visit: Payer: Self-pay | Admitting: Cardiovascular Disease

## 2015-04-20 ENCOUNTER — Other Ambulatory Visit: Payer: Self-pay | Admitting: Endocrinology

## 2015-04-20 NOTE — Telephone Encounter (Signed)
I contacted [facility] via fax. Requested a call back if need be.

## 2015-04-20 NOTE — Telephone Encounter (Signed)
I contacted [facility] via fax and provided information for th pt. Requested call back if need be.

## 2015-04-21 ENCOUNTER — Telehealth: Payer: Self-pay

## 2015-04-21 ENCOUNTER — Telehealth: Payer: Self-pay | Admitting: Endocrinology

## 2015-04-21 DIAGNOSIS — E1149 Type 2 diabetes mellitus with other diabetic neurological complication: Secondary | ICD-10-CM

## 2015-04-21 MED ORDER — LORAZEPAM 0.5 MG PO TABS: 0.5000 mg | ORAL_TABLET | Freq: Four times a day (QID) | ORAL | Status: AC | PRN

## 2015-04-21 NOTE — Telephone Encounter (Signed)
Patient is requesting a refill on his Lorazepam. Rx is listed under a historical provider. Thanks!

## 2015-04-21 NOTE — Telephone Encounter (Signed)
See note below and please advise. Thanks! 

## 2015-04-21 NOTE — Telephone Encounter (Signed)
Rx faxed to the pharmacy.

## 2015-04-21 NOTE — Telephone Encounter (Signed)
i printed 

## 2015-04-21 NOTE — Telephone Encounter (Signed)
Patients wife called stating that he needs a referral to [facility]   Also, an Rx needs to be sent to his pharmacy  Rx: Lorazapram    Thank  You

## 2015-04-21 NOTE — Telephone Encounter (Signed)
done

## 2015-04-23 ENCOUNTER — Encounter: Payer: Self-pay | Admitting: Endocrinology

## 2015-04-23 ENCOUNTER — Ambulatory Visit (INDEPENDENT_AMBULATORY_CARE_PROVIDER_SITE_OTHER): Payer: Commercial Managed Care - HMO | Admitting: Endocrinology

## 2015-04-23 VITALS — BP 128/70 | HR 60 | Temp 97.7°F | Ht 61.0 in

## 2015-04-23 DIAGNOSIS — N183 Chronic kidney disease, stage 3 unspecified: Secondary | ICD-10-CM

## 2015-04-23 DIAGNOSIS — E1149 Type 2 diabetes mellitus with other diabetic neurological complication: Secondary | ICD-10-CM | POA: Diagnosis not present

## 2015-04-23 DIAGNOSIS — I25810 Atherosclerosis of coronary artery bypass graft(s) without angina pectoris: Secondary | ICD-10-CM

## 2015-04-23 DIAGNOSIS — D509 Iron deficiency anemia, unspecified: Secondary | ICD-10-CM

## 2015-04-23 LAB — CBC WITH DIFFERENTIAL/PLATELET
BASOS ABS: 0 10*3/uL (ref 0.0–0.1)
Basophils Relative: 0.4 % (ref 0.0–3.0)
Eosinophils Absolute: 0.3 10*3/uL (ref 0.0–0.7)
Eosinophils Relative: 5.3 % — ABNORMAL HIGH (ref 0.0–5.0)
HEMATOCRIT: 40.6 % (ref 39.0–52.0)
HEMOGLOBIN: 13.6 g/dL (ref 13.0–17.0)
LYMPHS PCT: 20.6 % (ref 12.0–46.0)
Lymphs Abs: 1.2 10*3/uL (ref 0.7–4.0)
MCHC: 33.5 g/dL (ref 30.0–36.0)
MCV: 91.1 fl (ref 78.0–100.0)
MONOS PCT: 10.4 % (ref 3.0–12.0)
Monocytes Absolute: 0.6 10*3/uL (ref 0.1–1.0)
NEUTROS PCT: 63.3 % (ref 43.0–77.0)
Neutro Abs: 3.7 10*3/uL (ref 1.4–7.7)
Platelets: 197 10*3/uL (ref 150.0–400.0)
RBC: 4.46 Mil/uL (ref 4.22–5.81)
RDW: 13.6 % (ref 11.5–15.5)
WBC: 5.8 10*3/uL (ref 4.0–10.5)

## 2015-04-23 LAB — IBC PANEL
Iron: 66 ug/dL (ref 42–165)
SATURATION RATIOS: 20.2 % (ref 20.0–50.0)
Transferrin: 233 mg/dL (ref 212.0–360.0)

## 2015-04-23 LAB — POCT GLYCOSYLATED HEMOGLOBIN (HGB A1C): HEMOGLOBIN A1C: 6.2

## 2015-04-23 LAB — BASIC METABOLIC PANEL
BUN: 18 mg/dL (ref 6–23)
CALCIUM: 9.1 mg/dL (ref 8.4–10.5)
CHLORIDE: 101 meq/L (ref 96–112)
CO2: 31 meq/L (ref 19–32)
Creatinine, Ser: 1.18 mg/dL (ref 0.40–1.50)
GFR: 64.56 mL/min (ref >60.00)
Glucose, Bld: 121 mg/dL — ABNORMAL HIGH (ref 70–99)
POTASSIUM: 4.3 meq/L (ref 3.5–5.1)
SODIUM: 138 meq/L (ref 135–145)

## 2015-04-23 NOTE — Progress Notes (Signed)
Subjective:    Patient ID: Mark French, male    DOB: Oct 14, 1945, 69 y.o.   MRN: [id number]  HPI Pt returns for f/u of diabetes mellitus: DM type: Insulin-requiring type 2 Dx'ed: 5035 Complications: polyneuropathy, CAD, renal insufficiency, and retinopathy. Therapy: insulin since 1998.  DKA: never.  Severe hypoglycemia: never.   Pancreatitis: never.   Other: therapy has been limited by pt's need for a simple, inexpensive insulin regimen; he has been d/c'ed from hospice care, due to stability in his health, but prognosis is still poor.   Interval history: I asked wife, who says cbg's are well-controlled.   He denies hypoglycemia.  I again asked wife, who says she gives him 0-15 units per day (none on most days), according to am-cbg.   Arthralgias: have recently become more generalized, but are well-controlled on several pain meds.   He takes fe 1/day.  Denies brbpr.  Dr Zadie Rhine said he was pale.  Past Medical History  Diagnosis Date  . CORONARY ARTERY DISEASE     a. s/p CABG 1998. b.  08/2011: inferior STEMI.  LHC 09/07/11: Severe 3v CAD, LIMA-LAD patent, SVG-RCA chronically occluded, SVG-circumflex chronically occluded, SVG-diagonal 95%. PCI: Promus DES to the SVG-diagonal.  Procedure c/b transient CHB req CPR and TTVP. c. Myoview 11/2011: scar but no ischemia.   [name] DIABETES MELLITUS, TYPE I   . HYPERTENSION   . 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 HYPERCHOLESTEROLEMIA   . CERVICAL RADICULOPATHY, LEFT   . PERIPHERAL NEUROPATHY   . Chronic systolic heart failure     Echocardiogram 08/09/13:Cavity size normal, EF 40%, inferior hypokinesis, grade 1 diastolic dysfunction, paradoxical septal motion, mild MR, mild LAE, no PFO, PAS 51, moderate pulm HTN  . ED (erectile dysfunction)   . Hypogonadism male   . GI bleed 2012    Multiple Duodenal ulcer   . Obesity   . Legally blind     bilaterally  . Stroke 80    age 55 months; residual right sided weakness  . History of blood  transfusion 2012  . Charcot foot due to diabetes mellitus     chronic pain  . Osteoarthritis     right hip and shoulder  . COPD (chronic obstructive pulmonary disease)   . 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 Hiatal hernia   . Chronic stasis dermatitis     Chronic stasis changes.  . CKD (chronic kidney disease), stage III   . Syncope     a. 07/2013: suspected due to hypotension.  . Biventricular cardiac pacemaker in situ     a. [address] [place] RF model [id number] (serial number [id number])  . Complete heart block     a. s/p pacemaker placement on 08/2013 admission  . Duodenal ulcer     Past Surgical History  Procedure Laterality Date  . Carpal tunnel release Left   . Sigmoidoscopy  03/11/2001  . Venous doppler  01/30/2004  . Tonsillectomy and adenoidectomy      "as a kid"  . Cataract extraction w/ intraocular lens  implant, bilateral    . Coronary angioplasty with stent placement  08/2011    "1"  . Retinopathy surgery Bilateral 2000's    "laser; both eyes"  . Wrist fusion Right 1976  . Anterior fusion cervical spine  2010    "C spine; Dr. Ronnald Ramp"  . Ankle fracture surgery Left 1976; ?date    LEFT:  fused; removed bulk of hardware  . Heel spur surgery  Left ~ 2007    ? left  . Irrigation and debridement knee  02/27/2012    Procedure: IRRIGATION AND DEBRIDEMENT KNEE;  Surgeon: Augustin Schooling, MD;  Location: [facility];  Service: Orthopedics;  Laterality: Right;  irrigation and drainage right knee septic bursitis  . Eye surgery  2008    cataract removal, cautery  . Coronary artery bypass graft  1998    CABG X5  . Fracture surgery      foot broken 1976  . Pacemaker insertion  08/08/2013    Biventricular pacemaker: [employer] RF model [id number] (serial number [id number])  . Left heart catheterization with coronary/graft angiogram N/A 09/07/2011    Procedure: LEFT HEART CATHETERIZATION WITH [name];  Surgeon: Jettie Booze, MD;  Location: [facility] CATH LAB;  Service: Cardiovascular;  Laterality: N/A;  . Temporary pacemaker insertion  08/07/2013    Procedure: TEMPORARY PACEMAKER INSERTION;  Surgeon: Leonie Man, MD;  Location: [facility] CATH LAB;  Service: Cardiovascular;;  . Left heart catheterization with coronary/graft angiogram  08/07/2013    Procedure: LEFT HEART CATHETERIZATION WITH [name];  Surgeon: Leonie Man, MD;  Location: [facility] CATH LAB;  Service: Cardiovascular;;  . Bi-ventricular pacemaker insertion N/A 08/08/2013    Procedure: BI-VENTRICULAR PACEMAKER INSERTION (CRT-P);  Surgeon: Coralyn Mark, MD;  Location: [facility] CATH LAB;  Service: Cardiovascular;  Laterality: N/A;  . Left heart catheterization with coronary/graft angiogram N/A 10/27/2013    Procedure: LEFT HEART CATHETERIZATION WITH [name];  Surgeon: Sinclair Grooms, MD;  Location: [facility] CATH LAB;  Service: Cardiovascular;  Laterality: N/A;    Social History   Social History  . Marital Status: Married    Spouse Name: N/A  . Number of Children: 0  . Years of Education: N/A   Occupational History  . Disabled    Social History Main Topics  . Smoking status: Never Smoker   . Smokeless tobacco: Never Used  . Alcohol Use: No  . Drug Use: No  . Sexual Activity: No   Other Topics Concern  . Not on file   Social History Narrative   Comes to appointments with wife      0 Caffeine drinks daily     Current Outpatient Prescriptions on File Prior to Visit  Medication Sig Dispense Refill  . AMBULATORY NON FORMULARY MEDICATION Pt on O2 - 2 liter    . aspirin EC 81 MG tablet Take 81 mg by mouth at bedtime.    [name] atorvastatin (LIPITOR) 40 MG tablet TAKE 1 TABLET BY MOUTH EVERY DAY 30 tablet 3  . Blood Glucose Monitoring Suppl (BAYER BREEZE 2 SYSTEM) W/DEVICE KIT 1 Device by Does not apply route once. 1 each 0  . calcitRIOL (ROCALTROL) 0.25 MCG capsule TAKE 1 CAPSULE BY MOUTH EVERY DAY 30 capsule 0  . carvedilol  (COREG) 3.125 MG tablet TAKE 1 TABLET BY MOUTH TWICE DAILY WITH A MEAL 60 tablet 5  . clopidogrel (PLAVIX) 75 MG tablet TAKE 1 TABLET BY MOUTH EVERY DAY 30 tablet 11  . co-enzyme Q-10 30 MG capsule Take 30 mg by mouth daily. @@ 2pm    . dicyclomine (BENTYL) 20 MG tablet Take 1 tablet (20 mg total) by mouth 2 (two) times daily. 30 tablet 0  . docusate sodium (COLACE) 100 MG capsule Take 300 mg by mouth daily.     . famotidine (PEPCID) 20 MG tablet Take 20 mg by mouth before cath procedure. stomach    .  furosemide (LASIX) 40 MG tablet TAKE 2 TABLETS BY MOUTH TWICE DAILY 120 tablet 3  . gabapentin (NEURONTIN) 400 MG capsule Take 2 capsules (800 mg total) by mouth 3 (three) times daily. 180 capsule 3  . HYDROcodone-acetaminophen (NORCO) 10-325 MG per tablet Take 1-2 tablets by mouth every 4 (four) hours as needed for moderate pain. 150 tablet 0  . hyoscyamine (LEVSIN SL) 0.125 MG SL tablet DISSOLVE 1 TABLET UNDER THE TONGUE EVERY 4 HOURS AS NEEDED 120 tablet 0  . hyoscyamine (LEVSIN SL) 0.125 MG SL tablet DISSOLVE 1 TABLET UNDER THE TONGUE EVERY 4 HOURS AS NEEDED 120 tablet 0  . isosorbide mononitrate (IMDUR) 30 MG 24 hr tablet TAKE 1 TABLET BY MOUTH EVERY DAY 30 tablet 6  . LORazepam (ATIVAN) 0.5 MG tablet Take 1 tablet (0.5 mg total) by mouth every 6 (six) hours as needed for anxiety (anxiety). 50 tablet 3  . losartan (COZAAR) 50 MG tablet TAKE 1/2 TABLET BY MOUTH EVERY DAY 45 tablet 3  . methadone (DOLOPHINE) 5 MG tablet Take 1 tablet (5 mg total) by mouth 2 (two) times daily. 60 tablet 0  . Morphine Sulfate (MORPHINE CONCENTRATE) 10 mg / 0.5 ml concentrated solution Place 0.25-0.5 mLs (5-10 mg total) under the tongue every 2 (two) hours as needed for moderate pain, severe pain, anxiety or shortness of breath. 15 mL 0  . nitroGLYCERIN (NITROSTAT) 0.4 MG SL tablet Place 1 tablet (0.4 mg total) under the tongue every 5 (five) minutes as needed for chest pain. 25 tablet 5  . OVER THE COUNTER MEDICATION  Place 1 drop into both eyes daily as needed (dry eyes). [facility] eye drops    . pantoprazole (PROTONIX) 40 MG tablet TAKE 1 TABLET BY MOUTH TWICE DAILY 60 tablet 3  . polyethylene glycol powder (GLYCOLAX/MIRALAX) powder Take 0.5 Containers by mouth daily.   1  . polyethylene glycol powder (GLYCOLAX/MIRALAX) powder DISSOLVE 9 GRAMS( 1/2 CAPFUL) IN AT LEAST 8 OUNCES OF WATER/ JUICE AND DRINK THREE TIMES WEEKLY 527 g 2  . potassium chloride SA (K-DUR,KLOR-CON) 20 MEQ tablet TAKE 1 TABLET BY MOUTH TWICE DAILY 60 tablet 5  . tamsulosin (FLOMAX) 0.4 MG CAPS capsule TAKE 1 CAPSULE BY MOUTH DAILY 30 capsule 0  . tolnaftate (ANTI-FUNGAL) 1 % powder Apply 1 application topically 3 (three) times a week. As needed for infection     No current facility-administered medications on file prior to visit.    Allergies  Allergen Reactions  . Sildenafil Nausea Only and Other (See Comments)    "took it once; heart went to pieces; never took it again"  . Percocet [Oxycodone-Acetaminophen] Other (See Comments)    Unknown reaction-possible altered mental state-per wife.    . Contrast Media [Iodinated Diagnostic Agents] Nausea Only    Family History  Problem Relation Age of Onset  . CAD Mother     Coronary Artery Disease  . CAD Father     Coronary Artery Disease  . CAD Paternal Grandmother     Coronary Artery Disease  . Colon cancer Neg Hx   . Diabetes Maternal Grandmother   . Cancer Maternal Grandmother     male cancer ? type  . Diabetes Brother     BP 128/70 mmHg  Pulse 60  Temp(Src) 97.7 F (36.5 C) (Oral)  Ht _0  (1.549 m)  SpO2 96%  Review of Systems Denies hematuria.  He has lost a few lbs.     Objective:   Physical Exam VITAL SIGNS:  See vs page GENERAL: no distress.  In power chair.   Lab Results  Component Value Date   HGBA1C 6.2 04/23/2015   Lab Results  Component Value Date   WBC 5.8 04/23/2015   HGB 13.6 04/23/2015   HCT 40.6 04/23/2015   MCV 91.1 04/23/2015   PLT  197.0 04/23/2015      Assessment & Plan:  DM: overcontrolled. fe-def anemia: well-controlled on fe tabs Chronic pain syndrome: well-controlled: same rx.   Patient is advised the following: Patient Instructions  blood tests are requested for you today.  We'll let you know about the results. Please avoid the insulin altogether. Please come back for a follow-up appointment in 2 months.   Please continue the same pain medication.   check your blood sugar once a day.  vary the time of day when you check, between before the 3 meals, and at bedtime.  also check if you have symptoms of your blood sugar being too high or too low.  please keep a record of the readings and bring it to your next appointment here.  You can write it on any piece of paper.  please call us sooner if your blood sugar goes below 70, or if you have a lot of readings over 200.

## 2015-04-23 NOTE — Patient Instructions (Addendum)
blood tests are requested for you today.  We'll let you know about the results. Please avoid the insulin altogether. Please come back for a follow-up appointment in 2 months.   Please continue the same pain medication.   check your blood sugar once a day.  vary the time of day when you check, between before the 3 meals, and at bedtime.  also check if you have symptoms of your blood sugar being too high or too low.  please keep a record of the readings and bring it to your next appointment here.  You can write it on any piece of paper.  please call us sooner if your blood sugar goes below 70, or if you have a lot of readings over 200.

## 2015-04-26 LAB — PTH, INTACT AND CALCIUM
CALCIUM: 9 mg/dL (ref 8.4–10.5)
PTH: 95 pg/mL — ABNORMAL HIGH (ref 14–64)

## 2015-05-04 ENCOUNTER — Encounter: Payer: Self-pay | Admitting: Endocrinology

## 2015-05-04 ENCOUNTER — Telehealth: Payer: Self-pay | Admitting: Internal Medicine

## 2015-05-04 MED ORDER — HYOSCYAMINE SULFATE 0.125 MG SL SUBL: SUBLINGUAL_TABLET | SUBLINGUAL | Status: AC

## 2015-05-04 NOTE — Telephone Encounter (Signed)
Patient called requesting a refill on hyoscyamine. Talked with patient's wife and told her that Dr. Olevia Perches retired and the patient wold have to choose another physician. appt made with Dr. Havery Moros for 06/11/2015. medication refilled until appointment.

## 2015-05-14 ENCOUNTER — Telehealth: Payer: Self-pay | Admitting: Endocrinology

## 2015-05-14 ENCOUNTER — Telehealth: Payer: Self-pay

## 2015-05-14 ENCOUNTER — Other Ambulatory Visit: Payer: Self-pay

## 2015-05-14 MED ORDER — PANTOPRAZOLE SODIUM 40 MG PO TBEC: 40.0000 mg | DELAYED_RELEASE_TABLET | Freq: Two times a day (BID) | ORAL | Status: AC

## 2015-05-14 MED ORDER — HYDROCODONE-ACETAMINOPHEN 10-325 MG PO TABS: 1.0000 | ORAL_TABLET | ORAL | Status: AC | PRN

## 2015-05-14 MED ORDER — METHADONE HCL 5 MG PO TABS: 5.0000 mg | ORAL_TABLET | Freq: Two times a day (BID) | ORAL | Status: AC

## 2015-05-14 NOTE — Telephone Encounter (Signed)
Pt has already picked a new physician and is scheduled with Dr. Zelphia Cairo on 06/11/2015. Refilled pt's Pantoprazole.

## 2015-05-14 NOTE — Telephone Encounter (Addendum)
Patients wife called needing a refill on Rx  Rx: Methadone   Also, Mark French has a week left of his hydrocodone and would like to know if the script can be written for that as well    Thank you

## 2015-05-14 NOTE — Telephone Encounter (Signed)
See note below and please advise, Thanks! 

## 2015-05-14 NOTE — Telephone Encounter (Signed)
i printed both refills

## 2015-05-17 ENCOUNTER — Other Ambulatory Visit: Payer: Self-pay | Admitting: Endocrinology

## 2015-05-17 NOTE — Telephone Encounter (Signed)
I contacted the pt's wife and advise both refills are ready for pick up. Rx placed up front.

## 2015-05-24 ENCOUNTER — Ambulatory Visit: Payer: Commercial Managed Care - HMO | Admitting: Endocrinology

## 2015-05-24 ENCOUNTER — Other Ambulatory Visit: Payer: Self-pay

## 2015-05-24 ENCOUNTER — Other Ambulatory Visit: Payer: Self-pay | Admitting: Endocrinology

## 2015-05-24 NOTE — Telephone Encounter (Signed)
Verbal order given on 05-23-15 for RX ordered on 05-14-15. Pharmacy states they didn't get the electronic refills.

## 2015-05-25 ENCOUNTER — Telehealth: Payer: Self-pay | Admitting: Endocrinology

## 2015-05-25 NOTE — Telephone Encounter (Signed)
please call patient: i received prior auth form for lorazepam.   Please try to buy it without insurance. If it is too expensive, we can change to a cheaper alternative.  Please let us know.

## 2015-05-26 NOTE — Telephone Encounter (Signed)
Pt wife is aware of information and she will try to purchase this without insurance

## 2015-05-27 ENCOUNTER — Emergency Department (HOSPITAL_COMMUNITY): Payer: Commercial Managed Care - HMO

## 2015-05-27 ENCOUNTER — Encounter (HOSPITAL_COMMUNITY): Payer: Self-pay | Admitting: Emergency Medicine

## 2015-05-27 ENCOUNTER — Inpatient Hospital Stay (HOSPITAL_COMMUNITY)
Admission: EM | Admit: 2015-05-27 | Discharge: 2015-06-02 | DRG: 314 | Disposition: A | Discharge status: Hospice | Payer: Commercial Managed Care - HMO | Attending: Internal Medicine | Admitting: Internal Medicine

## 2015-05-27 DIAGNOSIS — I5042 Chronic combined systolic (congestive) and diastolic (congestive) heart failure: Secondary | ICD-10-CM | POA: Diagnosis present

## 2015-05-27 DIAGNOSIS — E1065 Type 1 diabetes mellitus with hyperglycemia: Secondary | ICD-10-CM | POA: Diagnosis present

## 2015-05-27 DIAGNOSIS — I252 Old myocardial infarction: Secondary | ICD-10-CM

## 2015-05-27 DIAGNOSIS — E1051 Type 1 diabetes mellitus with diabetic peripheral angiopathy without gangrene: Secondary | ICD-10-CM | POA: Diagnosis present

## 2015-05-27 DIAGNOSIS — Z515 Encounter for palliative care: Secondary | ICD-10-CM | POA: Diagnosis present

## 2015-05-27 DIAGNOSIS — I2510 Atherosclerotic heart disease of native coronary artery without angina pectoris: Secondary | ICD-10-CM | POA: Diagnosis present

## 2015-05-27 DIAGNOSIS — J9601 Acute respiratory failure with hypoxia: Secondary | ICD-10-CM | POA: Diagnosis present

## 2015-05-27 DIAGNOSIS — J449 Chronic obstructive pulmonary disease, unspecified: Secondary | ICD-10-CM | POA: Diagnosis present

## 2015-05-27 DIAGNOSIS — R935 Abnormal findings on diagnostic imaging of other abdominal regions, including retroperitoneum: Secondary | ICD-10-CM | POA: Diagnosis present

## 2015-05-27 DIAGNOSIS — I129 Hypertensive chronic kidney disease with stage 1 through stage 4 chronic kidney disease, or unspecified chronic kidney disease: Secondary | ICD-10-CM | POA: Diagnosis present

## 2015-05-27 DIAGNOSIS — I5022 Chronic systolic (congestive) heart failure: Secondary | ICD-10-CM | POA: Diagnosis present

## 2015-05-27 DIAGNOSIS — Z833 Family history of diabetes mellitus: Secondary | ICD-10-CM | POA: Diagnosis not present

## 2015-05-27 DIAGNOSIS — G9341 Metabolic encephalopathy: Secondary | ICD-10-CM | POA: Diagnosis present

## 2015-05-27 DIAGNOSIS — I82403 Acute embolism and thrombosis of unspecified deep veins of lower extremity, bilateral: Secondary | ICD-10-CM | POA: Diagnosis not present

## 2015-05-27 DIAGNOSIS — H548 Legal blindness, as defined in USA: Secondary | ICD-10-CM | POA: Diagnosis present

## 2015-05-27 DIAGNOSIS — E785 Hyperlipidemia, unspecified: Secondary | ICD-10-CM | POA: Diagnosis present

## 2015-05-27 DIAGNOSIS — Z794 Long term (current) use of insulin: Secondary | ICD-10-CM | POA: Diagnosis not present

## 2015-05-27 DIAGNOSIS — I272 Other secondary pulmonary hypertension: Secondary | ICD-10-CM | POA: Diagnosis present

## 2015-05-27 DIAGNOSIS — E118 Type 2 diabetes mellitus with unspecified complications: Secondary | ICD-10-CM | POA: Diagnosis present

## 2015-05-27 DIAGNOSIS — Z79899 Other long term (current) drug therapy: Secondary | ICD-10-CM

## 2015-05-27 DIAGNOSIS — I4891 Unspecified atrial fibrillation: Secondary | ICD-10-CM | POA: Diagnosis present

## 2015-05-27 DIAGNOSIS — K560 Paralytic ileus: Secondary | ICD-10-CM | POA: Diagnosis present

## 2015-05-27 DIAGNOSIS — E876 Hypokalemia: Secondary | ICD-10-CM | POA: Diagnosis not present

## 2015-05-27 DIAGNOSIS — N183 Chronic kidney disease, stage 3 unspecified: Secondary | ICD-10-CM | POA: Diagnosis present

## 2015-05-27 DIAGNOSIS — D509 Iron deficiency anemia, unspecified: Secondary | ICD-10-CM | POA: Diagnosis present

## 2015-05-27 DIAGNOSIS — Y831 Surgical operation with implant of artificial internal device as the cause of abnormal reaction of the patient, or of later complication, without mention of misadventure at the time of the procedure: Secondary | ICD-10-CM | POA: Diagnosis present

## 2015-05-27 DIAGNOSIS — E103599 Type 1 diabetes mellitus with proliferative diabetic retinopathy without macular edema, unspecified eye: Secondary | ICD-10-CM | POA: Diagnosis present

## 2015-05-27 DIAGNOSIS — I5043 Acute on chronic combined systolic (congestive) and diastolic (congestive) heart failure: Secondary | ICD-10-CM | POA: Diagnosis present

## 2015-05-27 DIAGNOSIS — Z950 Presence of cardiac pacemaker: Secondary | ICD-10-CM | POA: Diagnosis present

## 2015-05-27 DIAGNOSIS — G894 Chronic pain syndrome: Secondary | ICD-10-CM | POA: Diagnosis present

## 2015-05-27 DIAGNOSIS — E1042 Type 1 diabetes mellitus with diabetic polyneuropathy: Secondary | ICD-10-CM | POA: Diagnosis present

## 2015-05-27 DIAGNOSIS — T827XXD Infection and inflammatory reaction due to other cardiac and vascular devices, implants and grafts, subsequent encounter: Secondary | ICD-10-CM | POA: Diagnosis not present

## 2015-05-27 DIAGNOSIS — Z7982 Long term (current) use of aspirin: Secondary | ICD-10-CM

## 2015-05-27 DIAGNOSIS — J690 Pneumonitis due to inhalation of food and vomit: Secondary | ICD-10-CM | POA: Diagnosis present

## 2015-05-27 DIAGNOSIS — I509 Heart failure, unspecified: Secondary | ICD-10-CM | POA: Diagnosis not present

## 2015-05-27 DIAGNOSIS — M4856XA Collapsed vertebra, not elsewhere classified, lumbar region, initial encounter for fracture: Secondary | ICD-10-CM | POA: Diagnosis present

## 2015-05-27 DIAGNOSIS — A419 Sepsis, unspecified organism: Secondary | ICD-10-CM | POA: Diagnosis present

## 2015-05-27 DIAGNOSIS — E1022 Type 1 diabetes mellitus with diabetic chronic kidney disease: Secondary | ICD-10-CM | POA: Diagnosis present

## 2015-05-27 DIAGNOSIS — Z888 Allergy status to other drugs, medicaments and biological substances status: Secondary | ICD-10-CM

## 2015-05-27 DIAGNOSIS — A4101 Sepsis due to Methicillin susceptible Staphylococcus aureus: Secondary | ICD-10-CM | POA: Diagnosis present

## 2015-05-27 DIAGNOSIS — Z91041 Radiographic dye allergy status: Secondary | ICD-10-CM

## 2015-05-27 DIAGNOSIS — E7800 Pure hypercholesterolemia, unspecified: Secondary | ICD-10-CM | POA: Diagnosis present

## 2015-05-27 DIAGNOSIS — Z79891 Long term (current) use of opiate analgesic: Secondary | ICD-10-CM | POA: Diagnosis not present

## 2015-05-27 DIAGNOSIS — Z66 Do not resuscitate: Secondary | ICD-10-CM | POA: Diagnosis not present

## 2015-05-27 DIAGNOSIS — D696 Thrombocytopenia, unspecified: Secondary | ICD-10-CM | POA: Diagnosis present

## 2015-05-27 DIAGNOSIS — I255 Ischemic cardiomyopathy: Secondary | ICD-10-CM | POA: Diagnosis present

## 2015-05-27 DIAGNOSIS — IMO0002 Reserved for concepts with insufficient information to code with codable children: Secondary | ICD-10-CM | POA: Diagnosis present

## 2015-05-27 DIAGNOSIS — Z981 Arthrodesis status: Secondary | ICD-10-CM | POA: Diagnosis not present

## 2015-05-27 DIAGNOSIS — K567 Ileus, unspecified: Secondary | ICD-10-CM | POA: Diagnosis not present

## 2015-05-27 DIAGNOSIS — R0600 Dyspnea, unspecified: Secondary | ICD-10-CM | POA: Diagnosis not present

## 2015-05-27 DIAGNOSIS — R410 Disorientation, unspecified: Secondary | ICD-10-CM | POA: Diagnosis not present

## 2015-05-27 DIAGNOSIS — E860 Dehydration: Secondary | ICD-10-CM | POA: Diagnosis present

## 2015-05-27 DIAGNOSIS — Z885 Allergy status to narcotic agent status: Secondary | ICD-10-CM | POA: Diagnosis not present

## 2015-05-27 DIAGNOSIS — I442 Atrioventricular block, complete: Secondary | ICD-10-CM | POA: Diagnosis present

## 2015-05-27 DIAGNOSIS — L8990 Pressure ulcer of unspecified site, unspecified stage: Secondary | ICD-10-CM | POA: Diagnosis present

## 2015-05-27 DIAGNOSIS — R69 Illness, unspecified: Secondary | ICD-10-CM | POA: Diagnosis not present

## 2015-05-27 DIAGNOSIS — B9561 Methicillin susceptible Staphylococcus aureus infection as the cause of diseases classified elsewhere: Secondary | ICD-10-CM | POA: Diagnosis not present

## 2015-05-27 DIAGNOSIS — Z978 Presence of other specified devices: Secondary | ICD-10-CM | POA: Diagnosis not present

## 2015-05-27 DIAGNOSIS — R0603 Acute respiratory distress: Secondary | ICD-10-CM | POA: Diagnosis present

## 2015-05-27 DIAGNOSIS — N179 Acute kidney failure, unspecified: Secondary | ICD-10-CM | POA: Diagnosis present

## 2015-05-27 DIAGNOSIS — T827XXA Infection and inflammatory reaction due to other cardiac and vascular devices, implants and grafts, initial encounter: Secondary | ICD-10-CM | POA: Diagnosis present

## 2015-05-27 DIAGNOSIS — I872 Venous insufficiency (chronic) (peripheral): Secondary | ICD-10-CM | POA: Diagnosis present

## 2015-05-27 DIAGNOSIS — I25810 Atherosclerosis of coronary artery bypass graft(s) without angina pectoris: Secondary | ICD-10-CM | POA: Diagnosis present

## 2015-05-27 DIAGNOSIS — R0682 Tachypnea, not elsewhere classified: Secondary | ICD-10-CM

## 2015-05-27 DIAGNOSIS — Z7902 Long term (current) use of antithrombotics/antiplatelets: Secondary | ICD-10-CM

## 2015-05-27 DIAGNOSIS — G629 Polyneuropathy, unspecified: Secondary | ICD-10-CM

## 2015-05-27 DIAGNOSIS — R0902 Hypoxemia: Secondary | ICD-10-CM

## 2015-05-27 DIAGNOSIS — I10 Essential (primary) hypertension: Secondary | ICD-10-CM | POA: Diagnosis present

## 2015-05-27 DIAGNOSIS — Z8249 Family history of ischemic heart disease and other diseases of the circulatory system: Secondary | ICD-10-CM

## 2015-05-27 DIAGNOSIS — E1165 Type 2 diabetes mellitus with hyperglycemia: Secondary | ICD-10-CM | POA: Diagnosis present

## 2015-05-27 DIAGNOSIS — I639 Cerebral infarction, unspecified: Secondary | ICD-10-CM

## 2015-05-27 DIAGNOSIS — R509 Fever, unspecified: Secondary | ICD-10-CM | POA: Diagnosis present

## 2015-05-27 DIAGNOSIS — I480 Paroxysmal atrial fibrillation: Secondary | ICD-10-CM | POA: Diagnosis present

## 2015-05-27 LAB — I-STAT CG4 LACTIC ACID, ED
Lactic Acid, Venous: 2.1 mmol/L (ref 0.5–2.0)
Lactic Acid, Venous: 2.88 mmol/L (ref 0.5–2.0)

## 2015-05-27 LAB — CBC WITH DIFFERENTIAL/PLATELET
BASOS ABS: 0 10*3/uL (ref 0.0–0.1)
BASOS PCT: 0 %
EOS ABS: 0 10*3/uL (ref 0.0–0.7)
EOS PCT: 0 %
HCT: 45.8 % (ref 39.0–52.0)
HEMOGLOBIN: 15.2 g/dL (ref 13.0–17.0)
LYMPHS ABS: 0.2 10*3/uL — AB (ref 0.7–4.0)
Lymphocytes Relative: 3 %
MCH: 30.5 pg (ref 26.0–34.0)
MCHC: 33.2 g/dL (ref 30.0–36.0)
MCV: 92 fL (ref 78.0–100.0)
MONOS PCT: 8 %
Monocytes Absolute: 0.6 10*3/uL (ref 0.1–1.0)
NEUTROS PCT: 89 %
Neutro Abs: 6.7 10*3/uL (ref 1.7–7.7)
Platelets: 126 10*3/uL — ABNORMAL LOW (ref 150–400)
RBC: 4.98 MIL/uL (ref 4.22–5.81)
RDW: 13.4 % (ref 11.5–15.5)
WBC: 7.5 10*3/uL (ref 4.0–10.5)

## 2015-05-27 LAB — COMPREHENSIVE METABOLIC PANEL
ALK PHOS: 69 U/L (ref 38–126)
ALT: 23 U/L (ref 17–63)
AST: 26 U/L (ref 15–41)
Albumin: 3.9 g/dL (ref 3.5–5.0)
Anion gap: 13 (ref 5–15)
BUN: 19 mg/dL (ref 6–20)
CALCIUM: 9.1 mg/dL (ref 8.9–10.3)
CHLORIDE: 99 mmol/L — AB (ref 101–111)
CO2: 26 mmol/L (ref 22–32)
CREATININE: 1.27 mg/dL — AB (ref 0.61–1.24)
GFR, EST NON AFRICAN AMERICAN: 55 mL/min — AB (ref >60)
Glucose, Bld: 238 mg/dL — ABNORMAL HIGH (ref 65–99)
Potassium: 3.3 mmol/L — ABNORMAL LOW (ref 3.5–5.1)
Sodium: 138 mmol/L (ref 135–145)
Total Bilirubin: 0.8 mg/dL (ref 0.3–1.2)
Total Protein: 7.3 g/dL (ref 6.5–8.1)

## 2015-05-27 LAB — URINALYSIS, ROUTINE W REFLEX MICROSCOPIC
BILIRUBIN URINE: NEGATIVE
Glucose, UA: NEGATIVE mg/dL
Hgb urine dipstick: NEGATIVE
KETONES UR: 15 mg/dL — AB
Leukocytes, UA: NEGATIVE
NITRITE: NEGATIVE
Protein, ur: NEGATIVE mg/dL
SPECIFIC GRAVITY, URINE: 1.013 (ref 1.005–1.030)
UROBILINOGEN UA: 1 mg/dL (ref 0.0–1.0)
pH: 5.5 (ref 5.0–8.0)

## 2015-05-27 LAB — PHOSPHORUS: Phosphorus: 3.4 mg/dL (ref 2.5–4.6)

## 2015-05-27 LAB — GLUCOSE, CAPILLARY
GLUCOSE-CAPILLARY: 203 mg/dL — AB (ref 65–99)
Glucose-Capillary: 178 mg/dL — ABNORMAL HIGH (ref 65–99)

## 2015-05-27 LAB — MAGNESIUM: MAGNESIUM: 2.1 mg/dL (ref 1.7–2.4)

## 2015-05-27 LAB — INFLUENZA PANEL BY PCR (TYPE A & B)
H1N1 flu by pcr: NOT DETECTED
INFLAPCR: NEGATIVE
INFLBPCR: NEGATIVE

## 2015-05-27 LAB — MRSA PCR SCREENING: MRSA BY PCR: NEGATIVE

## 2015-05-27 LAB — LACTIC ACID, PLASMA
LACTIC ACID, VENOUS: 2.7 mmol/L — AB (ref 0.5–2.0)
LACTIC ACID, VENOUS: 2.4 mmol/L — AB (ref 0.5–2.0)

## 2015-05-27 LAB — PROCALCITONIN: PROCALCITONIN: 1.01 ng/mL

## 2015-05-27 LAB — TSH: TSH: 0.428 u[IU]/mL (ref 0.350–4.500)

## 2015-05-27 MED ORDER — KETOROLAC TROMETHAMINE 15 MG/ML IJ SOLN: 15.0000 mg | Freq: Four times a day (QID) | INTRAMUSCULAR | Status: DC | PRN

## 2015-05-27 MED ORDER — SODIUM CHLORIDE 0.9 % IV BOLUS (SEPSIS)
1000.0000 mL | Freq: Once | INTRAVENOUS | Status: AC
  Administered 2015-05-27: 1000 mL via INTRAVENOUS

## 2015-05-27 MED ORDER — SODIUM CHLORIDE 0.9 % IV BOLUS (SEPSIS)
1000.0000 mL | Freq: Once | INTRAVENOUS | Status: AC
  Administered 2015-05-27: 500 mL via INTRAVENOUS

## 2015-05-27 MED ORDER — PIPERACILLIN-TAZOBACTAM 3.375 G IVPB
3.3750 g | Freq: Three times a day (TID) | INTRAVENOUS | Status: DC
  Administered 2015-05-27 – 2015-05-29 (×6): 3.375 g via INTRAVENOUS
  Filled 2015-05-27 (×7): qty 50

## 2015-05-27 MED ORDER — KETOROLAC TROMETHAMINE 15 MG/ML IJ SOLN
15.0000 mg | Freq: Once | INTRAMUSCULAR | Status: AC
  Administered 2015-05-27: 15 mg via INTRAVENOUS
  Filled 2015-05-27: qty 1

## 2015-05-27 MED ORDER — POTASSIUM CHLORIDE IN NACL 20-0.9 MEQ/L-% IV SOLN
INTRAVENOUS | Status: DC
  Administered 2015-05-27 – 2015-05-28 (×3): via INTRAVENOUS
  Filled 2015-05-27 (×6): qty 1000

## 2015-05-27 MED ORDER — VANCOMYCIN HCL IN DEXTROSE 1-5 GM/200ML-% IV SOLN
1000.0000 mg | Freq: Once | INTRAVENOUS | Status: AC
  Administered 2015-05-27: 1000 mg via INTRAVENOUS
  Filled 2015-05-27: qty 200

## 2015-05-27 MED ORDER — SODIUM CHLORIDE 0.9 % IV SOLN: INTRAVENOUS | Status: DC

## 2015-05-27 MED ORDER — ACETAMINOPHEN 650 MG RE SUPP
650.0000 mg | Freq: Four times a day (QID) | RECTAL | Status: DC | PRN
  Administered 2015-05-27 – 2015-05-28 (×2): 650 mg via RECTAL
  Filled 2015-05-27 (×3): qty 1

## 2015-05-27 MED ORDER — POTASSIUM CHLORIDE 10 MEQ/100ML IV SOLN
10.0000 meq | INTRAVENOUS | Status: AC
Start: 2015-05-27 — End: 2015-05-27
  Administered 2015-05-27 (×2): 10 meq via INTRAVENOUS
  Filled 2015-05-27 (×2): qty 100

## 2015-05-27 MED ORDER — ACETAMINOPHEN 325 MG PO TABS: 650.0000 mg | ORAL_TABLET | Freq: Four times a day (QID) | ORAL | Status: DC | PRN

## 2015-05-27 MED ORDER — HYDRALAZINE HCL 20 MG/ML IJ SOLN: 10.0000 mg | Freq: Four times a day (QID) | INTRAMUSCULAR | Status: DC | PRN

## 2015-05-27 MED ORDER — INSULIN ASPART 100 UNIT/ML ~~LOC~~ SOLN
0.0000 [IU] | SUBCUTANEOUS | Status: DC
  Administered 2015-05-27: 2 [IU] via SUBCUTANEOUS
  Administered 2015-05-27: 3 [IU] via SUBCUTANEOUS
  Administered 2015-05-28 (×3): 2 [IU] via SUBCUTANEOUS
  Administered 2015-05-29: 3 [IU] via SUBCUTANEOUS
  Administered 2015-05-29: 1 [IU] via SUBCUTANEOUS
  Administered 2015-05-29 (×3): 2 [IU] via SUBCUTANEOUS
  Administered 2015-05-29 – 2015-05-30 (×2): 1 [IU] via SUBCUTANEOUS
  Administered 2015-05-30 (×2): 2 [IU] via SUBCUTANEOUS
  Administered 2015-05-30 (×2): 1 [IU] via SUBCUTANEOUS
  Administered 2015-05-30: 2 [IU] via SUBCUTANEOUS
  Administered 2015-05-30 – 2015-05-31 (×2): 1 [IU] via SUBCUTANEOUS
  Administered 2015-05-31 (×3): 2 [IU] via SUBCUTANEOUS
  Administered 2015-05-31: 3 [IU] via SUBCUTANEOUS
  Administered 2015-06-01 (×2): 2 [IU] via SUBCUTANEOUS
  Administered 2015-06-01: 1 [IU] via SUBCUTANEOUS
  Administered 2015-06-01 – 2015-06-02 (×6): 2 [IU] via SUBCUTANEOUS

## 2015-05-27 MED ORDER — SODIUM CHLORIDE 0.9 % IV SOLN
INTRAVENOUS | Status: DC
  Administered 2015-05-27: 13:00:00 via INTRAVENOUS

## 2015-05-27 MED ORDER — PIPERACILLIN-TAZOBACTAM 3.375 G IVPB 30 MIN
3.3750 g | Freq: Once | INTRAVENOUS | Status: AC
Start: 2015-05-27 — End: 2015-05-27
  Administered 2015-05-27: 3.375 g via INTRAVENOUS
  Filled 2015-05-27: qty 50

## 2015-05-27 MED ORDER — PROMETHAZINE HCL 25 MG/ML IJ SOLN
6.2500 mg | Freq: Four times a day (QID) | INTRAMUSCULAR | Status: DC | PRN
  Administered 2015-05-27: 6.25 mg via INTRAVENOUS
  Filled 2015-05-27 (×2): qty 1

## 2015-05-27 MED ORDER — ACETAMINOPHEN 650 MG RE SUPP
650.0000 mg | Freq: Once | RECTAL | Status: AC
  Administered 2015-05-27: 650 mg via RECTAL
  Filled 2015-05-27: qty 1

## 2015-05-27 MED ORDER — METOPROLOL TARTRATE 1 MG/ML IV SOLN: 5.0000 mg | Freq: Four times a day (QID) | INTRAVENOUS | Status: DC

## 2015-05-27 MED ORDER — VANCOMYCIN HCL IN DEXTROSE 750-5 MG/150ML-% IV SOLN
750.0000 mg | Freq: Two times a day (BID) | INTRAVENOUS | Status: DC
  Administered 2015-05-28 (×3): 750 mg via INTRAVENOUS
  Filled 2015-05-27 (×5): qty 150

## 2015-05-27 MED ORDER — METOCLOPRAMIDE HCL 5 MG/ML IJ SOLN
5.0000 mg | Freq: Four times a day (QID) | INTRAMUSCULAR | Status: DC
  Administered 2015-05-27 – 2015-06-02 (×22): 5 mg via INTRAVENOUS
  Filled 2015-05-27: qty 1
  Filled 2015-05-27: qty 2
  Filled 2015-05-27 (×26): qty 1

## 2015-05-27 MED ORDER — ENOXAPARIN SODIUM 40 MG/0.4ML ~~LOC~~ SOLN
40.0000 mg | SUBCUTANEOUS | Status: DC
  Administered 2015-05-27 – 2015-05-30 (×4): 40 mg via SUBCUTANEOUS
  Filled 2015-05-27 (×5): qty 0.4

## 2015-05-27 MED ORDER — ONDANSETRON HCL 4 MG/2ML IJ SOLN
4.0000 mg | Freq: Once | INTRAMUSCULAR | Status: AC
  Administered 2015-05-27: 4 mg via INTRAVENOUS
  Filled 2015-05-27: qty 2

## 2015-05-27 NOTE — ED Notes (Signed)
Per EMS, pt coming from home for altered mental status and fever. Pt usually sleeps in his chair at home. Pt has been non-verbal since yesterday at 1800. Pts temperature last night was 102 per wife. CBG: 236, BP:154/86, RR: 30, HR: 66. Pt arrousable to pain.

## 2015-05-27 NOTE — Progress Notes (Signed)
Pt taken off NRB and placed on 6L [place] at this time. SPO2 99%. RT will continue to monitor.

## 2015-05-27 NOTE — ED Notes (Signed)
Pts wife wishes to get palliative care involved. EDP notified.

## 2015-05-27 NOTE — ED Notes (Signed)
Unable toaccess all ED screening questions due to altered mental status.

## 2015-05-27 NOTE — Progress Notes (Signed)
RN called RT to pt's room to place on Bipap due to [facility] and Decreased SPO2. Pt on NRB at 15L/100%. SPO2 95%. RN reports pt has been vomiting and an order for Zofran is being placed. RT spoke with Hospitalist MD and explained due to vomiting, bipap contraindicated at this time. RT, RN and MD in agreement to keep pt on NRB, give zofran and re-evaluate in 1 hour for need for bipap.RT will continue to monitor.

## 2015-05-27 NOTE — ED Notes (Signed)
Pt had an episode of vomiting pts head elevated and pt rolled to the side. Pt had gurgling sound after vomiting in throat pt suctioned. Pts SpO2 went to 80% on 5L [place]. Pt placed on NRB. MD Riswan made aware. Pt to be given zofran 4mg  and placed on Bi-Pap 1 hour after if vomting has stopped.

## 2015-05-27 NOTE — H&P (Signed)
[facility] History and Physical                                                                                    Mark French, is a 69 y.o. male  MRN: [id number]   DOB - 06-24-1946  Admit Date - 05/27/2015  Outpatient Primary MD for the patient is [name], MD  Referring MD: Johnney Killian / ER  With History of -  Past Medical History  Diagnosis Date  . CORONARY ARTERY DISEASE     a. s/p CABG 1998. b.  08/2011: inferior STEMI.  LHC 09/07/11: Severe 3v CAD, LIMA-LAD patent, SVG-RCA chronically occluded, SVG-circumflex chronically occluded, SVG-diagonal 95%. PCI: Promus DES to the SVG-diagonal.  Procedure c/b transient CHB req CPR and TTVP. c. Myoview 11/2011: scar but no ischemia.   [name] DIABETES MELLITUS, TYPE I   . HYPERTENSION   . 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 HYPERCHOLESTEROLEMIA   . CERVICAL RADICULOPATHY, LEFT   . PERIPHERAL NEUROPATHY   . Chronic systolic heart failure (HCC)     Echocardiogram 08/09/13:Cavity size normal, EF 40%, inferior hypokinesis, grade 1 diastolic dysfunction, paradoxical septal motion, mild MR, mild LAE, no PFO, PAS 51, moderate pulm HTN  . ED (erectile dysfunction)   . Hypogonadism male   . GI bleed 2012    Multiple Duodenal ulcer   . Obesity   . Legally blind     bilaterally  . Stroke [facility]) 1945    age 79 months; residual right sided weakness  . History of blood transfusion 2012  . Charcot foot due to diabetes mellitus (HCC)     chronic pain  . Osteoarthritis     right hip and shoulder  . COPD (chronic obstructive pulmonary disease) ([place])   . 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 Hiatal hernia   . Chronic stasis dermatitis     Chronic stasis changes.  . CKD (chronic kidney disease), stage III   . Syncope     a. 07/2013: suspected due to hypotension.  . Biventricular cardiac pacemaker in situ     a. [address] [employer] [place] RF model [id number] (serial number [id number])  . Complete heart block  (HCC)     a. s/p pacemaker placement on 08/2013 admission  . Duodenal ulcer       Past Surgical History  Procedure Laterality Date  . Carpal tunnel release Left   . Sigmoidoscopy  03/11/2001  . Venous doppler  01/30/2004  . Tonsillectomy and adenoidectomy      "as a kid"  . Cataract extraction w/ intraocular lens  implant, bilateral    . Coronary angioplasty with stent placement  08/2011    "1"  . Retinopathy surgery Bilateral 2000's    "laser; both eyes"  . Wrist fusion Right 1976  . Anterior fusion cervical spine  2010    "C spine; Dr. Ronnald Ramp"  . Ankle fracture surgery Left 1976; ?date    LEFT:  fused; removed bulk of hardware  . Heel spur surgery Left ~ 2007    ? left  . Irrigation and debridement knee  02/27/2012    Procedure: IRRIGATION AND DEBRIDEMENT KNEE;  Surgeon: Augustin Schooling, MD;  Location: [place];  Service: Orthopedics;  Laterality: Right;  irrigation and drainage right knee septic bursitis  . Eye surgery  2008    cataract removal, cautery  . Coronary artery bypass graft  1998    CABG X5  . Fracture surgery      foot broken 1976  . Pacemaker insertion  08/08/2013    Biventricular pacemaker: [place] RF model [id number] (serial number [id number])  . Left heart catheterization with coronary/graft angiogram N/A 09/07/2011    Procedure: LEFT HEART CATHETERIZATION WITH [name];  Surgeon: Jettie Booze, MD;  Location: [facility] CATH LAB;  Service: Cardiovascular;  Laterality: N/A;  . Temporary pacemaker insertion  08/07/2013    Procedure: TEMPORARY PACEMAKER INSERTION;  Surgeon: Leonie Man, MD;  Location: [facility] CATH LAB;  Service: Cardiovascular;;  . Left heart catheterization with coronary/graft angiogram  08/07/2013    Procedure: LEFT HEART CATHETERIZATION WITH [name];  Surgeon: Leonie Man, MD;  Location: [facility] CATH LAB;  Service: Cardiovascular;;  . Bi-ventricular pacemaker insertion N/A 08/08/2013    Procedure:  BI-VENTRICULAR PACEMAKER INSERTION (CRT-P);  Surgeon: Coralyn Mark, MD;  Location: [facility] CATH LAB;  Service: Cardiovascular;  Laterality: N/A;  . Left heart catheterization with coronary/graft angiogram N/A 10/27/2013    Procedure: LEFT HEART CATHETERIZATION WITH [name];  Surgeon: Sinclair Grooms, MD;  Location: [facility] CATH LAB;  Service: Cardiovascular;  Laterality: N/A;    in for   Chief Complaint  Patient presents with  . Altered Mental Status     HPI This is a 69 year old male patient with a history of diabetes controlled with sliding scale insulin and diet, chronic combined systolic and diastolic heart failure with associated severe pulmonary hypertension, history of CAD with prior CABG procedure, hypertension, legally blind from diabetic retinopathy, dyslipidemia, severe peripheral neuropathy, reported compression fracture low back with chronic pain, childhood stroke 1945, chronic stasis dermatitis lower extremities, history of complete heart block requiring pacemaker placement, and chronic kidney disease. Patient was sent to the ER by his wife via EMS after developing fevers that began yesterday afternoon. Initial fever was 102.3. No apparent associated symptoms such as coughing, nausea, vomiting or diarrhea. Wife reports that as a treat she bought the patient a fast food sandwich shortly after eating a sandwich patient developed fever. Despite utilizing Tylenol patient's fever persisted during the night. It was also noted that with the onset of fever patient had altered mentation and really has not verbally communicated with the wife since onset of fever. He does not appear to have any focal neurological deficits other than not talking. He did receive influenza vaccine.  In the ER the patient had a rectal temperature of 102.6 which is decreased and 98.6 after IV fluids and rectal Tylenol. He is normotensive with a blood pressure 138/62 and MAP of 72. Pulse is regular in the 80s  and ventricular paced, respiratory rate between 19 and 24. Patient was empirically placed on 2 L nasal cannula oxygen saturating 100%. Portable chest x-ray was read as mild CHF but has more of an appearance of pneumonitis. Patient was mildly hypokalemic with potassium of 3.3, he also had mild acute renal failure with creatinine of 1.27 and a GFR of 55 with baseline creatinine 1.18. Lactic acid was mildly elevated at 2.1 with repeat lactic acid several hours later 2.88. In addition to the Tylenol patient has received a total of 1 L of IV fluids in the  ER because of elevated lactic acid and fever of unknown origin and he was given empiric Zosyn and vancomycin and blood cultures and urine cultures were obtained. Urinalysis did not appear consistent with a UTI. EKG revealed sinus rhythm with first AV block with prolonged QTC likely from underlying left bundle branch block  Shortly after I evaluated the patient and and prior to my attending evaluating the patient, the patient apparently had an witnessed episode of emesis and when attended to by nursing staff was noted to have increased respiratory rate and decreased O2 saturation of 88% where he had previously been maintaining O2 saturations between 98 and 100% on 2 L nasal cannula.   Review of Systems   In addition to the HPI above,  No Headache, changes with Vision or hearing, new weakness, tingling, numbness in any extremity, No problems swallowing food or Liquids, indigestion/reflux No Chest pain, Cough or Shortness of Breath, palpitations, orthopnea or DOE No Abdominal pain, N/V; no melena or hematochezia, no dark tarry stools No dysuria, hematuria or flank pain No new skin rashes, lesions, masses or bruises, No new joints pains-aches No recent weight gain or loss No polyuria, polydypsia or polyphagia,  *A full 10 point Review of Systems was done, except as stated above, all other Review of Systems were negative.  Social History Social History   Substance Use Topics  . Smoking status: Never Smoker   . Smokeless tobacco: Never Used  . Alcohol Use: No    Resides at: Private residence  Lives with: Wife  Ambulatory status: Very limited mobility for short distances only with walker but primarily utilizing wheelchair   Family History Family History  Problem Relation Age of Onset  . CAD Mother     Coronary Artery Disease  . CAD Father     Coronary Artery Disease  . CAD Paternal Grandmother     Coronary Artery Disease  . Colon cancer Neg Hx   . Diabetes Maternal Grandmother   . Cancer Maternal Grandmother     male cancer ? type  . Diabetes Brother      Prior to Admission medications   Medication Sig Start Date End Date Taking? Authorizing Provider  AMBULATORY NON FORMULARY MEDICATION Pt on O2 - 2 liter   Yes Historical Provider, MD  aspirin EC 81 MG tablet Take 81 mg by mouth at bedtime.   Yes Historical Provider, MD  atorvastatin (LIPITOR) 40 MG tablet TAKE 1 TABLET BY MOUTH EVERY DAY 04/21/15  Yes Sherren Mocha, MD  Blood Glucose Monitoring Suppl (BAYER BREEZE 2 SYSTEM) W/DEVICE KIT 1 Device by Does not apply route once. 11/20/14  Yes [name], MD  calcitRIOL (ROCALTROL) 0.25 MCG capsule TAKE 1 CAPSULE BY MOUTH EVERY DAY 05/24/15  Yes [name], MD  carvedilol (COREG) 3.125 MG tablet TAKE 1 TABLET BY MOUTH TWICE DAILY WITH A MEAL 12/15/14  Yes Thompson Grayer, MD  clopidogrel (PLAVIX) 75 MG tablet TAKE 1 TABLET BY MOUTH EVERY DAY 02/24/15  Yes Sherren Mocha, MD  co-enzyme Q-10 30 MG capsule Take 30 mg by mouth daily. @@ 2pm   Yes Historical Provider, MD  docusate sodium (COLACE) 100 MG capsule Take 300 mg by mouth daily.    Yes Historical Provider, MD  famotidine (PEPCID) 20 MG tablet Take 20 mg by mouth before cath procedure. stomach   Yes Historical Provider, MD  famotidine (PEPCID) 20 MG tablet Take 20 mg by mouth daily as needed. indigestion 04/29/15  Yes Historical Provider, MD  furosemide (LASIX)  40 MG tablet  TAKE 2 TABLETS BY MOUTH TWICE DAILY 02/24/15  Yes Sherren Mocha, MD  gabapentin (NEURONTIN) 400 MG capsule Take 2 capsules (800 mg total) by mouth 3 (three) times daily. 04/16/15  Yes [name], MD  HYDROcodone-acetaminophen (NORCO) 10-325 MG tablet Take 1-2 tablets by mouth every 4 (four) hours as needed for moderate pain. 05/14/15  Yes [name], MD  hyoscyamine (LEVSIN SL) 0.125 MG SL tablet DISSOLVE 1 TABLET UNDER THE TONGUE EVERY 4 HOURS AS NEEDED 03/25/15  Yes Lafayette Dragon, MD  isosorbide mononitrate (IMDUR) 30 MG 24 hr tablet TAKE 1 TABLET BY MOUTH EVERY DAY 01/06/15  Yes Sherren Mocha, MD  LORazepam (ATIVAN) 0.5 MG tablet Take 1 tablet (0.5 mg total) by mouth every 6 (six) hours as needed for anxiety (anxiety). 04/21/15  Yes [name], MD  losartan (COZAAR) 50 MG tablet TAKE 1/2 TABLET BY MOUTH EVERY DAY 09/10/14  Yes Sherren Mocha, MD  methadone (DOLOPHINE) 5 MG tablet Take 1 tablet (5 mg total) by mouth 2 (two) times daily. 05/14/15  Yes [name], MD  Morphine Sulfate (MORPHINE CONCENTRATE) 10 mg / 0.5 ml concentrated solution Place 0.25-0.5 mLs (5-10 mg total) under the tongue every 2 (two) hours as needed for moderate pain, severe pain, anxiety or shortness of breath. 10/30/13  Yes Debbe Odea, MD  nitroGLYCERIN (NITROSTAT) 0.4 MG SL tablet Place 1 tablet (0.4 mg total) under the tongue every 5 (five) minutes as needed for chest pain. 10/29/12  Yes Sherren Mocha, MD  OVER THE COUNTER MEDICATION Place 1 drop into both eyes daily as needed (dry eyes). [facility] eye drops   Yes Historical Provider, MD  pantoprazole (PROTONIX) 40 MG tablet Take 1 tablet (40 mg total) by mouth 2 (two) times daily. 05/14/15  Yes Manus Gunning, MD  polyethylene glycol powder [facility]) powder Take 1 Container by mouth daily.  08/18/14  Yes Historical Provider, MD  polyethylene glycol powder (GLYCOLAX/MIRALAX) powder DISSOLVE 9 GRAMS( 1/2 CAPFUL) IN AT LEAST 8 OUNCES OF WATER/ JUICE AND DRINK  THREE TIMES WEEKLY 03/02/15  Yes Lafayette Dragon, MD  potassium chloride SA (K-DUR,KLOR-CON) 20 MEQ tablet TAKE 1 TABLET BY MOUTH TWICE DAILY 02/05/15  Yes Sherren Mocha, MD  Sennosides-Docusate Sodium (SENNA PLUS PO) Take 1-4 capsules by mouth every morning. 03/25/15  Yes Historical Provider, MD  tamsulosin (FLOMAX) 0.4 MG CAPS capsule TAKE 1 CAPSULE BY MOUTH DAILY 05/17/15  Yes [name], MD  tolnaftate (ANTI-FUNGAL) 1 % powder Apply 1 application topically 3 (three) times a week. As needed for infection   Yes Historical Provider, MD  dicyclomine (BENTYL) 20 MG tablet Take 1 tablet (20 mg total) by mouth 2 (two) times daily. Patient not taking: Reported on 05/27/2015 12/23/14   Lafayette Dragon, MD  hyoscyamine (LEVSIN SL) 0.125 MG SL tablet DISSOLVE 1 TABLET UNDER THE TONGUE EVERY 4 HOURS AS NEEDED Patient not taking: Reported on 05/27/2015 05/04/15   Manus Gunning, MD    Allergies  Allergen Reactions  . Sildenafil Nausea Only and Other (See Comments)    "took it once; heart went to pieces; never took it again"  . Percocet [Oxycodone-Acetaminophen] Other (See Comments)    Unknown reaction-possible altered mental state-per wife.    . Contrast Media [Iodinated Diagnostic Agents] Nausea Only    Physical Exam  Vitals  Blood pressure 145/57, pulse 85, temperature 98.6 F (37 C), temperature source Oral, resp. rate 26, weight 190 lb (86.183 kg), SpO2 98 %.  General:  Initially was in no acute distress except for persistent altered mentation but subsequently has developed mild-to-moderate respiratory distress after emesis  Psych: Lethargic and essentially nonverbal, when stimulated mumbled, very weak spontaneous movement of extremities but did not follow commands  Neuro:   No focal neurological deficits, CN II through XII intact, unable to adequately test strength and sensation due to altered mentation, has chronic atrophy of right upper extremity  ENT:  Ears and Eyes appear Normal,  Conjunctivae clear sclerae are injected, PER. Extremely dry oral mucosa without erythema or exudates.  Neck:  Supple, No lymphadenopathy appreciated  Respiratory:  Symmetrical chest wall movement, Good air movement bilaterally, CTAB. Room Air  Cardiac:  RRR, No Murmurs, no LE edema noted, no JVD, No carotid bruits, peripheral pulses palpable at 2+  Abdomen:  Positive bowel sounds, Soft, Non tender, Non distended,  No masses appreciated, no obvious hepatosplenomegaly  Skin:  No Cyanosis, POOR Skin Turgor, No Bruise. Stocking like hemosiderin skin changes bilateral lower extremities  Extremities: Symmetrical without obvious trauma or injury,  no effusions.  Data Review  CBC  Recent Labs Lab 05/27/15 0920  WBC 7.5  HGB 15.2  HCT 45.8  PLT 126*  MCV 92.0  MCH 30.5  MCHC 33.2  RDW 13.4  LYMPHSABS 0.2*  MONOABS 0.6  EOSABS 0.0  BASOSABS 0.0    Chemistries   Recent Labs Lab 05/27/15 0920  NA 138  K 3.3*  CL 99*  CO2 26  GLUCOSE 238*  BUN 19  CREATININE 1.27*  CALCIUM 9.1  AST 26  ALT 23  ALKPHOS 69  BILITOT 0.8    estimated creatinine clearance is 49.7 mL/min (by C-G formula based on Cr of 1.27).  No results for input(s): TSH, T4TOTAL, T3FREE, THYROIDAB in the last 72 hours.  Invalid input(s): FREET3  Coagulation profile No results for input(s): INR, PROTIME in the last 168 hours.  No results for input(s): DDIMER in the last 72 hours.  Cardiac Enzymes No results for input(s): CKMB, TROPONINI, MYOGLOBIN in the last 168 hours.  Invalid input(s): CK  Invalid input(s): POCBNP  Urinalysis    Component Value Date/Time   COLORURINE YELLOW 05/27/2015 [place] 05/27/2015 0952   LABSPEC 1.013 05/27/2015 0952   PHURINE 5.5 05/27/2015 [place] 05/27/2015 0952   GLUCOSEU NEGATIVE 08/21/2014 0919   HGBUR NEGATIVE 05/27/2015 0952   BILIRUBINUR NEGATIVE 05/27/2015 0952   KETONESUR 15* 05/27/2015 0952   PROTEINUR NEGATIVE  05/27/2015 0952   UROBILINOGEN 1.0 05/27/2015 0952   NITRITE NEGATIVE 05/27/2015 0952   LEUKOCYTESUR NEGATIVE 05/27/2015 0952    Imaging results:   Dg Chest Portable 1 View  05/27/2015  CLINICAL DATA:  Code sepsis EXAM: PORTABLE CHEST 1 VIEW COMPARISON:  06/09/2014 FINDINGS: Chronic cardiomegaly with pulmonary venous congestion and interstitial coarsening above baseline. No pneumonia, effusion, or air leak. Status post CABG. There is a biventricular pacer from the left without evidence of interval lead displacement. IMPRESSION: Mild CHF. Electronically Signed   By: Monte Fantasia M.D.   On: 05/27/2015 11:14     EKG: (Independently reviewed) sinus rhythm first degree AV block with widened QRS consistent with left bundle branch block, prolonged QTC 538 ms   Assessment & Plan  Principal Problem:   FUO (fever of unknown origin) -Admit to stepdown -Lactic acid elevated at 2.18 and 2.88 but hemodynamically stable with MAP greater than 65, no tachycardia and initially no tachypnea (see below) therefore does not meet criteria  for sepsis-he has no leukocytosis but did have fever greater than 100.4 which is more consistent with acute inflammatory process likely related to viral syndrome -Follow-up on blood and urine cultures noting urinalysis normal in appearance -Symptom management with antipyretics primarily Tylenol; does have stage III chronic kidney disease but will allow a one-time dose of Toradol -Chest x-ray without focal infiltrates but does have appearance of pneumonitis which is further suggestive of viral etiology -Continue broad-spectrum empiric and antibiotics (see below) -Patient has subsequently developed emesis so we'll utilize IV Reglan and low-dose IV Phenergan (no Zofran in setting of prolonged QT interval)  Active Problems:   Acute respiratory failure with hypoxemia/likely aspiration pneumonitis -Patient initially presented without any significant respiratory symptoms other  than mild rhinorrhea for 24 hours -After arrival to ER patient had emesis with subsequent O2 desaturations to 88% suggestive of aspiration pneumonia-continue antibiotics as above -Continue supportive care with oxygen-due to recent emesis and altered mentation not an appropriate candidate for BiPAP at this juncture    Metabolic encephalopathy -Suspect related to fever and underlying infectious processes -NPO until more alert    AKI (HCC) on Chronic kidney disease, stage III (moderate) -Mild and appears primarily related to acute infectious processes with associated high fever and dehydration -Continue gentle volume resuscitation in setting of known systolic dysfunction which is chronic -Follow labs -Hold diuretics and any other offending medications    Thrombocytopenia (HCC) -New thrombocytopenia with platelets greater than 100,000 -Suspect related to underlying infectious process and acute inflammation -Follow labs    Hypokalemia -IV replacement by bolus -Add potassium to maintenance fluids    HTN (hypertension) -Currently hypertensive -Utilize scheduled IV Lopressor with holding parameters and when necessary IV hydralazine -Preadmission Cozaar, Imdur, carvedilol on hold    Coronary atherosclerosis of autologous vein bypass graft -No apparent cardiac ischemic symptoms endorsed by patient -Preadmission statin, Plavix, Imdur as well as carvedilol on hold while NPO    Chronic systolic heart failure (HCC) -Currently compensated but use caution with administration of IV fluids since ejection fraction is 40% and last echocardiogram March 2015 revealed severe hypokinesis of the inferior wall with left ventricular dilatation -Holding medications as above including Lasix    Chronic pain syndrome -due to ongoing peripheral neuropathy and apparent lumbar compression fracture -On methadone at home but currently holding all sedative medications due to acute metabolic encephalopathy -Monitor  for pain    Diabetes mellitus type II, uncontrolled (HCC) -Current CBGs greater than 200 and likely related to acute infectious processes -Review outpatient endocrinologist office notes; although not documented on current medication reconciliation at appears to patient's wife utilize a sliding scale insulin and diet to control patient's diabetes -Check hemoglobin A1c    Pulmonary HTN (HCC)/COPD -Severe with most recent reading of 83 mmHg in 2015 -Imdur on hold    HYPERCHOLESTEROLEMIA -Statin on hold    Iron deficiency anemia -Current hemoglobin 15.2 with baseline hemoglobin around 13 consistent with hemoconcentration and volume depletion    Peripheral neuropathy (HCC) -Preadmission narcotics and gabapentin on hold    BLINDNESS -Secondary to diabetic retinopathy    Pacemaker -Currently not actively pacing    DVT Prophylaxis: Lovenox  Family Communication:   Wife at bedside  Code Status:  Was DO NOT RESUSCITATE prior to admission but after a very lengthy discussion with the patient's wife she is opted for limited resuscitation primarily DO NOT INTUBATE no CPR and no defibrillation  Condition:  Guarded  Discharge disposition: If survives hospitalization anticipate return back to home  environment; based on outpatient documentation because patient had been doing well at appears as of September he had been discharged from hospice-patient requested to meet with palliative team which she did in the ER  Time spent in minutes : 60      ELLIS,ALLISON L. ANP on 05/27/2015 at 12:43 PM  Between 7am to 7pm - Pager - [phone]  After 7pm go to [email or web address] - password [email or web address]  And look for the night coverage person covering me after hours  [facility]   I have examined the patient, reviewed the chart and discussed the plan with Jamal Maes, NP and the patient's wife who is at bedside. I agree with the assessment and plan as outlined above.   69 year old male  presenting with confusion and fever. When asked specifically, his wife admits that he's had a runny nose since yesterday along with the fever. She has not noted any cough. ER workup initially unrevealing for an infectious source however due to symptoms, we'll be checking influenza panel. While in the ER the patient has vomited which has resulted in a sudden drop in oxygen saturation  & tachypnea. Initially pulse ox was 88% on 100% FiO2. When reevaluated 15 minutes later, pulse ox 100% and respiratory rate has slowed. Agree with current antibiotic regimen. We'll monitor in step down unit. DO NOT INTUBATE. BiPAP if needed.  Debbe Odea, MD

## 2015-05-27 NOTE — Progress Notes (Addendum)
RN paged attending MD regarding blood pressure in the 80s. STAT follow up lactic acid is pending. Will give 1L NS.   Erin Hearing, ANP  D/c orders for antihypertensives. He has not received any today.   Debbe Odea, MD

## 2015-05-27 NOTE — ED Provider Notes (Signed)
CSN: [id number]     Arrival date & time 05/27/15  0912 History   First MD Initiated Contact with Patient 05/27/15 0919     Chief Complaint  Patient presents with  . Altered Mental Status     (Consider location/radiation/quality/duration/timing/severity/associated sxs/prior Treatment) HPI Patient was ill yesterday. He had fever at home up to 102. He was resting much the day. Family members have been checking on him. By evening he was sleeping and still resting. Family members did not awaken him. This morning he was very difficult to arouse and confused. At this time report is from EMS. Family members are not yet present. There is report of 1 episode of vomiting that was this morning yellow in appearance. At this time no prior report of vomiting or diarrhea. Currently unknown if there was significant cough present. EMR reviewed, indicate significant past medical history. Patient has advanced heart disease, diabetes, hypertension, peripheral vascular disease.  Patient's wife now present. She does not have significant additional history. He seemed to be at baseline morning and afternoon. They ate a sandwich from [employer]. She reports by late afternoon he didn't seem to feel well. He not develop any vomiting or diarrhea. He was sleepy. They determined he had a fever to 102 when she made a call to a nurse hotline. Based on that they determined to let him rest and recheck. This morning he was very ill in appearance and vomited once. At baseline the patient is normally conversant and appropriately interactive.  Patient's wife reports that he had previously been on hospice and then taken off. At this point she does wish to get palliative care involved. Past Medical History  Diagnosis Date  . CORONARY ARTERY DISEASE     a. s/p CABG 1998. b.  08/2011: inferior STEMI.  LHC 09/07/11: Severe 3v CAD, LIMA-LAD patent, SVG-RCA chronically occluded, SVG-circumflex chronically occluded, SVG-diagonal  95%. PCI: Promus DES to the SVG-diagonal.  Procedure c/b transient CHB req CPR and TTVP. c. Myoview 11/2011: scar but no ischemia.   [name] DIABETES MELLITUS, TYPE I   . HYPERTENSION   . 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 HYPERCHOLESTEROLEMIA   . CERVICAL RADICULOPATHY, LEFT   . PERIPHERAL NEUROPATHY   . Chronic systolic heart failure (HCC)     Echocardiogram 08/09/13:Cavity size normal, EF 40%, inferior hypokinesis, grade 1 diastolic dysfunction, paradoxical septal motion, mild MR, mild LAE, no PFO, PAS 51, moderate pulm HTN  . ED (erectile dysfunction)   . Hypogonadism male   . GI bleed 2012    Multiple Duodenal ulcer   . Obesity   . Legally blind     bilaterally  . Stroke [facility]) 1945    age 62 months; residual right sided weakness  . History of blood transfusion 2012  . Charcot foot due to diabetes mellitus (HCC)     chronic pain  . Osteoarthritis     right hip and shoulder  . COPD (chronic obstructive pulmonary disease) ([place])   . 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 Hiatal hernia   . Chronic stasis dermatitis     Chronic stasis changes.  . CKD (chronic kidney disease), stage III   . Syncope     a. 07/2013: suspected due to hypotension.  . Biventricular cardiac pacemaker in situ     a. [address] [employer] [place] RF model [id number] (serial number [id number])  . Complete heart block (HCC)     a. s/p pacemaker placement on 08/2013 admission  .  Duodenal ulcer    Past Surgical History  Procedure Laterality Date  . Carpal tunnel release Left   . Sigmoidoscopy  03/11/2001  . Venous doppler  01/30/2004  . Tonsillectomy and adenoidectomy      "as a kid"  . Cataract extraction w/ intraocular lens  implant, bilateral    . Coronary angioplasty with stent placement  08/2011    "1"  . Retinopathy surgery Bilateral 2000's    "laser; both eyes"  . Wrist fusion Right 1976  . Anterior fusion cervical spine  2010    "C spine; Dr. Ronnald Ramp"  . Ankle fracture  surgery Left 1976; ?date    LEFT:  fused; removed bulk of hardware  . Heel spur surgery Left ~ 2007    ? left  . Irrigation and debridement knee  02/27/2012    Procedure: IRRIGATION AND DEBRIDEMENT KNEE;  Surgeon: Augustin Schooling, MD;  Location: [place];  Service: Orthopedics;  Laterality: Right;  irrigation and drainage right knee septic bursitis  . Eye surgery  2008    cataract removal, cautery  . Coronary artery bypass graft  1998    CABG X5  . Fracture surgery      foot broken 1976  . Pacemaker insertion  08/08/2013    Biventricular pacemaker: [place] RF model [id number] (serial number [id number])  . Left heart catheterization with coronary/graft angiogram N/A 09/07/2011    Procedure: LEFT HEART CATHETERIZATION WITH [name];  Surgeon: Jettie Booze, MD;  Location: [facility] CATH LAB;  Service: Cardiovascular;  Laterality: N/A;  . Temporary pacemaker insertion  08/07/2013    Procedure: TEMPORARY PACEMAKER INSERTION;  Surgeon: Leonie Man, MD;  Location[facility] CATH LAB;  Service: Cardiovascular;;  . Left heart catheterization with coronary/graft angiogram  08/07/2013    Procedure: LEFT HEART CATHETERIZATION WITH [name];  Surgeon: Leonie Man, MD;  Location: [facility] CATH LAB;  Service: Cardiovascular;;  . Bi-ventricular pacemaker insertion N/A 08/08/2013    Procedure: BI-VENTRICULAR PACEMAKER INSERTION (CRT-P);  Surgeon: Coralyn Mark, MD;  Location: [facility] CATH LAB;  Service: Cardiovascular;  Laterality: N/A;  . Left heart catheterization with coronary/graft angiogram N/A 10/27/2013    Procedure: LEFT HEART CATHETERIZATION WITH [name];  Surgeon: Sinclair Grooms, MD;  Location: [facility] CATH LAB;  Service: Cardiovascular;  Laterality: N/A;   Family History  Problem Relation Age of Onset  . CAD Mother     Coronary Artery Disease  . CAD Father     Coronary Artery Disease  . CAD Paternal Grandmother     Coronary Artery Disease  . Colon  cancer Neg Hx   . Diabetes Maternal Grandmother   . Cancer Maternal Grandmother     male cancer ? type  . Diabetes Brother    Social History  Substance Use Topics  . Smoking status: Never Smoker   . Smokeless tobacco: Never Used  . Alcohol Use: No    Review of Systems  Cannot obtain due to patient condition.  Allergies  Sildenafil; Percocet; and Contrast media  Home Medications   Prior to Admission medications   Medication Sig Start Date End Date Taking? Authorizing Provider  AMBULATORY NON FORMULARY MEDICATION Pt on O2 - 2 liter    Historical Provider, MD  aspirin EC 81 MG tablet Take 81 mg by mouth at bedtime.    Historical Provider, MD  atorvastatin (LIPITOR) 40 MG tablet TAKE 1 TABLET BY MOUTH EVERY DAY 04/21/15   Sherren Mocha, MD  Blood Glucose Monitoring Suppl (BAYER BREEZE 2 SYSTEM) W/DEVICE KIT 1 Device by Does not apply route once. 11/20/14   [name], MD  calcitRIOL (ROCALTROL) 0.25 MCG capsule TAKE 1 CAPSULE BY MOUTH EVERY DAY 05/24/15   [name], MD  carvedilol (COREG) 3.125 MG tablet TAKE 1 TABLET BY MOUTH TWICE DAILY WITH A MEAL 12/15/14   Thompson Grayer, MD  clopidogrel (PLAVIX) 75 MG tablet TAKE 1 TABLET BY MOUTH EVERY DAY 02/24/15   Sherren Mocha, MD  co-enzyme Q-10 30 MG capsule Take 30 mg by mouth daily. @@ 2pm    Historical Provider, MD  dicyclomine (BENTYL) 20 MG tablet Take 1 tablet (20 mg total) by mouth 2 (two) times daily. 12/23/14   Lafayette Dragon, MD  docusate sodium (COLACE) 100 MG capsule Take 300 mg by mouth daily.     Historical Provider, MD  famotidine (PEPCID) 20 MG tablet Take 20 mg by mouth before cath procedure. stomach    Historical Provider, MD  furosemide (LASIX) 40 MG tablet TAKE 2 TABLETS BY MOUTH TWICE DAILY 02/24/15   Sherren Mocha, MD  gabapentin (NEURONTIN) 400 MG capsule Take 2 capsules (800 mg total) by mouth 3 (three) times daily. 04/16/15   [name], MD  HYDROcodone-acetaminophen (NORCO) 10-325 MG tablet Take 1-2 tablets by  mouth every 4 (four) hours as needed for moderate pain. 05/14/15   [name], MD  hyoscyamine (LEVSIN SL) 0.125 MG SL tablet DISSOLVE 1 TABLET UNDER THE TONGUE EVERY 4 HOURS AS NEEDED 03/25/15   Lafayette Dragon, MD  hyoscyamine (LEVSIN SL) 0.125 MG SL tablet DISSOLVE 1 TABLET UNDER THE TONGUE EVERY 4 HOURS AS NEEDED 05/04/15   Manus Gunning, MD  isosorbide mononitrate (IMDUR) 30 MG 24 hr tablet TAKE 1 TABLET BY MOUTH EVERY DAY 01/06/15   Sherren Mocha, MD  LORazepam (ATIVAN) 0.5 MG tablet Take 1 tablet (0.5 mg total) by mouth every 6 (six) hours as needed for anxiety (anxiety). 04/21/15   [name], MD  losartan (COZAAR) 50 MG tablet TAKE 1/2 TABLET BY MOUTH EVERY DAY 09/10/14   Sherren Mocha, MD  methadone (DOLOPHINE) 5 MG tablet Take 1 tablet (5 mg total) by mouth 2 (two) times daily. 05/14/15   [name], MD  Morphine Sulfate (MORPHINE CONCENTRATE) 10 mg / 0.5 ml concentrated solution Place 0.25-0.5 mLs (5-10 mg total) under the tongue every 2 (two) hours as needed for moderate pain, severe pain, anxiety or shortness of breath. 10/30/13   Debbe Odea, MD  nitroGLYCERIN (NITROSTAT) 0.4 MG SL tablet Place 1 tablet (0.4 mg total) under the tongue every 5 (five) minutes as needed for chest pain. 10/29/12   Sherren Mocha, MD  OVER THE COUNTER MEDICATION Place 1 drop into both eyes daily as needed (dry eyes). [facility] eye drops    Historical Provider, MD  pantoprazole (PROTONIX) 40 MG tablet Take 1 tablet (40 mg total) by mouth 2 (two) times daily. 05/14/15   Manus Gunning, MD  polyethylene glycol powder [facility]) powder Take 0.5 Containers by mouth daily.  08/18/14   Historical Provider, MD  polyethylene glycol powder (GLYCOLAX/MIRALAX) powder DISSOLVE 9 GRAMS( 1/2 CAPFUL) IN AT LEAST 8 OUNCES OF WATER/ JUICE AND DRINK THREE TIMES WEEKLY 03/02/15   Lafayette Dragon, MD  potassium chloride SA (K-DUR,KLOR-CON) 20 MEQ tablet TAKE 1 TABLET BY MOUTH TWICE DAILY 02/05/15   Sherren Mocha, MD   tamsulosin (FLOMAX) 0.4 MG CAPS capsule TAKE 1 CAPSULE BY MOUTH DAILY 05/17/15   [name],  MD  tolnaftate (ANTI-FUNGAL) 1 % powder Apply 1 application topically 3 (three) times a week. As needed for infection    Historical Provider, MD   BP 153/57 mmHg  Pulse 86  Temp(Src) 98.6 F (37 C) (Oral)  Resp 25  Wt 190 lb (86.183 kg)  SpO2 97% Physical Exam  Constitutional:  Patient is somnolent and obtunded. He responds by moaning to painful stimulus. His airway is protected and he is tachypneic but not in significant respiratory distress. Skin is pale and hot.  HENT:  Head: Normocephalic and atraumatic.  Cardiovascular:  Distant heart sounds. Regular.  Pulmonary/Chest:  Tachypnea. Adequate air flow bilaterally. No gross wheeze rhonchi or rail.  Abdominal: Soft. He exhibits no distension.  Patient seems to express tenderness to palpation. He moans with palpation of the abdomen but there is no guarding present. He does have significant candidal intertriginous rash.  Genitourinary: Penis normal.  No perigenital swelling.  Musculoskeletal:  Patient has trace edema bilateral lower extremities. He has skin thinning there are small wounds present. Some diffuse erythema. Small necrotic spots on the tips of toes. See photo documentation.  Neurological:  Patient is obtunded and not following commands. He does withdraw to painful stimulus.  Skin:  Skin is hot and dry. Obvious rash in groin folds see photo documentation. No other rash on back or buttock.          ED Course  Procedures (including critical care time) CRITICAL CARE Performed by: Charlesetta Shanks   Total critical care time: 45  Critical care time was exclusive of separately billable procedures and treating other patients.  Critical care was necessary to treat or prevent imminent or life-threatening deterioration.  Critical care was time spent personally by me on the following activities: development of treatment plan  with patient and/or surrogate as well as nursing, discussions with consultants, evaluation of patient's response to treatment, examination of patient, obtaining history from patient or surrogate, ordering and performing treatments and interventions, ordering and review of laboratory studies, ordering and review of radiographic studies, pulse oximetry and re-evaluation of patient's condition. Labs Review Labs Reviewed  COMPREHENSIVE METABOLIC PANEL - Abnormal; Notable for the following:    Potassium 3.3 (*)    Chloride 99 (*)    Glucose, Bld 238 (*)    Creatinine, Ser 1.27 (*)    GFR calc non Af Amer 55 (*)    All other components within normal limits  CBC WITH DIFFERENTIAL/PLATELET - Abnormal; Notable for the following:    Platelets 126 (*)    Lymphs Abs 0.2 (*)    All other components within normal limits  URINALYSIS, ROUTINE W REFLEX MICROSCOPIC (NOT AT [facility]) - Abnormal; Notable for the following:    Ketones, ur 15 (*)    All other components within normal limits  I-STAT CG4 LACTIC ACID, ED - Abnormal; Notable for the following:    Lactic Acid, Venous 2.10 (*)    All other components within normal limits  CULTURE, BLOOD (ROUTINE X 2)  CULTURE, BLOOD (ROUTINE X 2)  URINE CULTURE  INFLUENZA PANEL BY PCR (TYPE A & B, H1N1)  I-STAT CG4 LACTIC ACID, ED    Imaging Review Dg Chest Portable 1 View  05/27/2015  CLINICAL DATA:  Code sepsis EXAM: PORTABLE CHEST 1 VIEW COMPARISON:  06/09/2014 FINDINGS: Chronic cardiomegaly with pulmonary venous congestion and interstitial coarsening above baseline. No pneumonia, effusion, or air leak. Status post CABG. There is a biventricular pacer from the left without evidence of interval lead  displacement. IMPRESSION: Mild CHF. Electronically Signed   By: Monte Fantasia M.D.   On: 05/27/2015 11:14   I have personally reviewed and evaluated these images and lab results as part of my medical decision-making.   EKG Interpretation   Date/Time:  Thursday  May 27 2015 09:16:05 EDT Ventricular Rate:  89 PR Interval:  204 QRS Duration: 161 QT Interval:  442 QTC Calculation: 538 R Axis:   -64 Text Interpretation:  Sinus rhythm Nonspecific IVCD with LAD LVH with  secondary repolarization abnormality Anterolateral infarct, old Probable  RV involvement, suggest recording right precordial leads old inferior  IVCD. abnormal Confirmed by Johnney Killian, MD, Jeannie Done [phone]) on 05/27/2015  9:22:45 AM      MDM   Final diagnoses:  Sepsis, due to unspecified organism ([place])  Severe comorbid illness   Patient presents with acute onset of fever and mental status change. At this time identified focus is potentially skin or gastrointestinal. Septic protocol initiated with administration of vancomycin and Zosyn. Patient previously been on hospice and at this point time per conversation with his wife palliative care will be consult it. Patient has multiple comorbid illnesses. At this time with fluid and supplemental oxygen the patient's vital signs are remaining stable. His mental status remains somnolent.    Charlesetta Shanks, MD 05/27/15 [phone]

## 2015-05-27 NOTE — Progress Notes (Signed)
ANTIBIOTIC CONSULT NOTE - INITIAL  Pharmacy Consult for vancomycin and zoysn Indication: FUO, ??? Viral pnemonitis  Allergies  Allergen Reactions  . Sildenafil Nausea Only and Other (See Comments)    "took it once; heart went to pieces; never took it again"  . Percocet [Oxycodone-Acetaminophen] Other (See Comments)    Unknown reaction-possible altered mental state-per wife.    . Contrast Media [Iodinated Diagnostic Agents] Nausea Only    Patient Measurements: Weight: 190 lb (86.183 kg)   Vital Signs: Temp: 98.6 F (37 C) (10/20 1144) Temp Source: Oral (10/20 1144) BP: 122/55 mmHg (10/20 1355) Pulse Rate: 98 (10/20 1355) Intake/Output from previous day:   Intake/Output from this shift: Total I/O In: 1250 [I.V.:1250] Out: 350 [Urine:350]  Labs:  Recent Labs  05/27/15 0920  WBC 7.5  HGB 15.2  PLT 126*  CREATININE 1.27*   Estimated Creatinine Clearance: 49.7 mL/min (by C-G formula based on Cr of 1.27). No results for input(s): VANCOTROUGH, VANCOPEAK, VANCORANDOM, GENTTROUGH, GENTPEAK, GENTRANDOM, TOBRATROUGH, TOBRAPEAK, TOBRARND, AMIKACINPEAK, AMIKACINTROU, AMIKACIN in the last 72 hours.   Microbiology: Recent Results (from the past 720 hour(s))  Blood Culture (routine x 2)     Status: None (Preliminary result)   Collection Time: 05/27/15  9:20 AM  Result Value Ref Range Status   Specimen Description BLOOD LEFT ANTECUBITAL  Final   Special Requests BOTTLES DRAWN AEROBIC AND ANAEROBIC 5ML  Final   Culture PENDING  Incomplete   Report Status PENDING  Incomplete  Blood Culture (routine x 2)     Status: None (Preliminary result)   Collection Time: 05/27/15  9:39 AM  Result Value Ref Range Status   Specimen Description BLOOD RIGHT ANTECUBITAL  Final   Special Requests BOTTLES DRAWN AEROBIC AND ANAEROBIC 5ML  Final   Culture PENDING  Incomplete   Report Status PENDING  Incomplete    Medical History: Past Medical History  Diagnosis Date  . CORONARY ARTERY DISEASE      a. s/p CABG 1998. b.  08/2011: inferior STEMI.  LHC 09/07/11: Severe 3v CAD, LIMA-LAD patent, SVG-RCA chronically occluded, SVG-circumflex chronically occluded, SVG-diagonal 95%. PCI: Promus DES to the SVG-diagonal.  Procedure c/b transient CHB req CPR and TTVP. c. Myoview 11/2011: scar but no ischemia.   [name] DIABETES MELLITUS, TYPE I   . HYPERTENSION   . 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 HYPERCHOLESTEROLEMIA   . CERVICAL RADICULOPATHY, LEFT   . PERIPHERAL NEUROPATHY   . Chronic systolic heart failure (HCC)     Echocardiogram 08/09/13:Cavity size normal, EF 40%, inferior hypokinesis, grade 1 diastolic dysfunction, paradoxical septal motion, mild MR, mild LAE, no PFO, PAS 51, moderate pulm HTN  . ED (erectile dysfunction)   . Hypogonadism male   . GI bleed 2012    Multiple Duodenal ulcer   . Obesity   . Legally blind     bilaterally  . Stroke [facility]) 1945    age 32 months; residual right sided weakness  . History of blood transfusion 2012  . Charcot foot due to diabetes mellitus (HCC)     chronic pain  . Osteoarthritis     right hip and shoulder  . COPD (chronic obstructive pulmonary disease) ([place])   . 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 Hiatal hernia   . Chronic stasis dermatitis     Chronic stasis changes.  . CKD (chronic kidney disease), stage III   . Syncope     a. 07/2013: suspected due to hypotension.  . Biventricular cardiac pacemaker  in situ     a. [address] [employer] [place] RF model [id number] (serial number [id number])  . Complete heart block (HCC)     a. s/p pacemaker placement on 08/2013 admission  . Duodenal ulcer    Assessment: code sepsis called for AMS & fever in 69 yo M in ED.  Rx to dose vanc/zosyn for FUO, ? Viral pneumonitis, pt vomited in ED ? Aspiration pneumonitis Wt 86.2, lactate 2.1 and 2.88 temp 102.6, wbc 7.5 creat 1.27 normalized creat cl 54; CXR no focal infiltates, appearance of pneumonitis which is suggestive of  viral etiology  Zosyn 10/20>> Vanc 10/20>>  10/20 BCx2>> 10/20 Ucx>> 10/20 RSV>>   Goal of Therapy:  Vancomycin trough level 15-20 mcg/ml  Plan:  - zosyn 3.375 gm IV x 1 given in ED over 30 minutes, then zosyn 3.375 gm IV q8h, infuse each dose over 4 hours -vancomycin 1 gm IV x 1 given in ED then vancomycin 750 mg IV q12h -f/u renal fxn, temp, culture data -q72 hr creatinie -vanc level as needed  Eudelia Bunch, Pharm.D. [phone] 05/27/2015 2:24 PM

## 2015-05-28 ENCOUNTER — Inpatient Hospital Stay (HOSPITAL_COMMUNITY): Payer: Commercial Managed Care - HMO

## 2015-05-28 DIAGNOSIS — G894 Chronic pain syndrome: Secondary | ICD-10-CM

## 2015-05-28 DIAGNOSIS — R509 Fever, unspecified: Secondary | ICD-10-CM

## 2015-05-28 DIAGNOSIS — G629 Polyneuropathy, unspecified: Secondary | ICD-10-CM | POA: Insufficient documentation

## 2015-05-28 DIAGNOSIS — E118 Type 2 diabetes mellitus with unspecified complications: Secondary | ICD-10-CM | POA: Insufficient documentation

## 2015-05-28 DIAGNOSIS — E785 Hyperlipidemia, unspecified: Secondary | ICD-10-CM | POA: Insufficient documentation

## 2015-05-28 DIAGNOSIS — I509 Heart failure, unspecified: Secondary | ICD-10-CM

## 2015-05-28 DIAGNOSIS — Z950 Presence of cardiac pacemaker: Secondary | ICD-10-CM | POA: Insufficient documentation

## 2015-05-28 DIAGNOSIS — Z515 Encounter for palliative care: Secondary | ICD-10-CM | POA: Diagnosis present

## 2015-05-28 DIAGNOSIS — J690 Pneumonitis due to inhalation of food and vomit: Secondary | ICD-10-CM

## 2015-05-28 LAB — COMPREHENSIVE METABOLIC PANEL
ALBUMIN: 2.9 g/dL — AB (ref 3.5–5.0)
ALT: 49 U/L (ref 17–63)
ANION GAP: 14 (ref 5–15)
AST: 62 U/L — ABNORMAL HIGH (ref 15–41)
Alkaline Phosphatase: 55 U/L (ref 38–126)
BILIRUBIN TOTAL: 0.7 mg/dL (ref 0.3–1.2)
BUN: 23 mg/dL — ABNORMAL HIGH (ref 6–20)
CO2: 24 mmol/L (ref 22–32)
Calcium: 8.4 mg/dL — ABNORMAL LOW (ref 8.9–10.3)
Chloride: 105 mmol/L (ref 101–111)
Creatinine, Ser: 1.3 mg/dL — ABNORMAL HIGH (ref 0.61–1.24)
GFR calc Af Amer: >60 mL/min (ref >60)
GFR calc non Af Amer: 54 mL/min — ABNORMAL LOW (ref >60)
GLUCOSE: 153 mg/dL — AB (ref 65–99)
POTASSIUM: 4 mmol/L (ref 3.5–5.1)
SODIUM: 143 mmol/L (ref 135–145)
TOTAL PROTEIN: 6.1 g/dL — AB (ref 6.5–8.1)

## 2015-05-28 LAB — RESPIRATORY VIRUS PANEL
Adenovirus: NEGATIVE
Influenza A: NEGATIVE
Influenza B: NEGATIVE
METAPNEUMOVIRUS: NEGATIVE
PARAINFLUENZA 1 A: NEGATIVE
PARAINFLUENZA 2 A: NEGATIVE
PARAINFLUENZA 3 A: NEGATIVE
RHINOVIRUS: NEGATIVE
Respiratory Syncytial Virus A: NEGATIVE
Respiratory Syncytial Virus B: NEGATIVE

## 2015-05-28 LAB — CBC
HEMATOCRIT: 47.6 % (ref 39.0–52.0)
HEMOGLOBIN: 15.6 g/dL (ref 13.0–17.0)
MCH: 30.5 pg (ref 26.0–34.0)
MCHC: 32.8 g/dL (ref 30.0–36.0)
MCV: 93 fL (ref 78.0–100.0)
Platelets: 90 10*3/uL — ABNORMAL LOW (ref 150–400)
RBC: 5.12 MIL/uL (ref 4.22–5.81)
RDW: 14.1 % (ref 11.5–15.5)
WBC: 8.3 10*3/uL (ref 4.0–10.5)

## 2015-05-28 LAB — HEMOGLOBIN A1C
HEMOGLOBIN A1C: 6.6 % — AB (ref 4.8–5.6)
MEAN PLASMA GLUCOSE: 143 mg/dL

## 2015-05-28 LAB — URINE CULTURE: CULTURE: NO GROWTH

## 2015-05-28 LAB — GLUCOSE, CAPILLARY
GLUCOSE-CAPILLARY: 170 mg/dL — AB (ref 65–99)
GLUCOSE-CAPILLARY: 132 mg/dL — AB (ref 65–99)
GLUCOSE-CAPILLARY: 157 mg/dL — AB (ref 65–99)
GLUCOSE-CAPILLARY: 170 mg/dL — AB (ref 65–99)
Glucose-Capillary: 190 mg/dL — ABNORMAL HIGH (ref 65–99)
Glucose-Capillary: 155 mg/dL — ABNORMAL HIGH (ref 65–99)

## 2015-05-28 LAB — TROPONIN I: Troponin I: 0.29 ng/mL — ABNORMAL HIGH (ref <0.031)

## 2015-05-28 LAB — LACTIC ACID, PLASMA
Lactic Acid, Venous: 2.3 mmol/L (ref 0.5–2.0)
Lactic Acid, Venous: 1.2 mmol/L (ref 0.5–2.0)

## 2015-05-28 MED ORDER — ONDANSETRON HCL 4 MG/2ML IJ SOLN
INTRAMUSCULAR | Status: AC
  Administered 2015-05-28: 4 mg
  Filled 2015-05-28: qty 2

## 2015-05-28 MED ORDER — ONDANSETRON HCL 4 MG/2ML IJ SOLN: 4.0000 mg | Freq: Four times a day (QID) | INTRAMUSCULAR | Status: DC | PRN

## 2015-05-28 MED ORDER — WHITE PETROLATUM GEL
Status: AC
  Administered 2015-05-28: 1
  Filled 2015-05-28: qty 1

## 2015-05-28 MED ORDER — PERFLUTREN LIPID MICROSPHERE
1.0000 mL | INTRAVENOUS | Status: AC | PRN
  Administered 2015-05-28: 2 mL via INTRAVENOUS
  Filled 2015-05-28: qty 10

## 2015-05-28 MED ORDER — MORPHINE SULFATE (PF) 2 MG/ML IV SOLN
1.0000 mg | INTRAVENOUS | Status: DC | PRN
  Administered 2015-05-28 – 2015-06-02 (×16): 2 mg via INTRAVENOUS
  Filled 2015-05-28 (×16): qty 1

## 2015-05-28 NOTE — Progress Notes (Addendum)
[facility] TEAM 1 - Stepdown/ICU TEAM Progress Note  Mark French [id number] DOB: September 05, 1945 DOA: 05/27/2015 PCP: [name], MD  Admit HPI / Brief Narrative: 69 year old WM PMHx DM Type 2 controlled with SSI and diet, Chronic combined Systolic and Diastolic CHF, severe Pulmonary HTN, CAD S/P CABG, HTN, Complete Heart Block requiring pacemaker placement, HLD, procedure, hypertension, legally blind from diabetic retinopathy, severe peripheral neuropathy, compression fracture low back with chronic pain, childhood stroke 1945, chronic stasis dermatitis lower extremities, CKD stage III, Duodenal Ulcer.  Patient was sent to the ER by his wife via EMS after developing fevers that began yesterday afternoon. Initial fever was 102.3. No apparent associated symptoms such as coughing, nausea, vomiting or diarrhea. Wife reports that as a treat she bought the patient a fast food sandwich shortly after eating a sandwich patient developed fever. Despite utilizing Tylenol patient's fever persisted during the night. It was also noted that with the onset of fever patient had altered mentation and really has not verbally communicated with the wife since onset of fever. He does not appear to have any focal neurological deficits other than not talking. He did receive influenza vaccine.  In the ER the patient had a rectal temperature of 102.6 which is decreased and 98.6 after IV fluids and rectal Tylenol. He is normotensive with a blood pressure 138/62 and MAP of 72. Pulse is regular in the 80s and ventricular paced, respiratory rate between 19 and 24. Patient was empirically placed on 2 L nasal cannula oxygen saturating 100%. Portable chest x-ray was read as mild CHF but has more of an appearance of pneumonitis. Patient was mildly hypokalemic with potassium of 3.3, he also had mild acute renal failure with creatinine of 1.27 and a GFR of 55 with baseline creatinine 1.18. Lactic acid was mildly elevated at 2.1 with  repeat lactic acid several hours later 2.88. In addition to the Tylenol patient has received a total of 1 L of IV fluids in the ER because of elevated lactic acid and fever of unknown origin and he was given empiric Zosyn and vancomycin and blood cultures and urine cultures were obtained. Urinalysis did not appear consistent with a UTI. EKG revealed sinus rhythm with first AV block with prolonged QTC likely from underlying left bundle branch block  Shortly after I evaluated the patient and and prior to my attending evaluating the patient, the patient apparently had an witnessed episode of emesis and when attended to by nursing staff was noted to have increased respiratory rate and decreased O2 saturation of 88% where he had previously been maintaining O2 saturations between 98 and 100% on 2 L nasal cannula.   HPI/Subjective: 10/21 A/O 0, spontaneously opens eyes occasionally will answer yes to questions.  Assessment/Plan: Positive Staph bacteremia/CAP -Lactic acid elevated at 2.18 and 2.88 but hemodynamically stable with MAP greater than 65, no tachycardia and initially no tachypnea (see below) therefore does not meet criteria for sepsis-he has no leukocytosis but did have fever greater than 100.4 which is more consistent with acute inflammatory process likely related to viral syndrome -Blood cultures 2/2 positive GPC  -CXR; LLL pneumonia? -Continue broad-spectrum empiric and antibiotics (see below) -Continue emesis so continue IV Reglan and low-dose IV Phenergan (no Zofran in setting of prolonged QT interval)  Acute respiratory failure with hypoxemia/likely aspiration pneumonia -Continue supportive care with oxygen-due to recent emesis and altered mentation not an appropriate candidate for BiPAP at this juncture -Titrate O2 to maintain KGY1> 85%   Metabolic encephalopathy -  Suspect related to fever and underlying infectious processes -NPO until more alert   AKI ([place]) on Chronic kidney  disease, stage III (moderate) -Mild and appears primarily related to acute infectious processes with associated high fever and dehydration -Continue gentle volume resuscitation in setting of known systolic dysfunction which is chronic -Follow labs -Hold diuretics and any other offending medications  Generalized ileus/Ogilvie syndrome? -Most likely multifactorial to include chronic high-dose narcotic use, staph bacteremia  -Place NG tube; low continuous suction  Thrombocytopenia (HCC) -New thrombocytopenia with platelets< 100,000 -Suspect related to underlying infectious process and acute inflammation  Hypokalemia -Potassium goal> 4  HTN (hypertension) -Currently hypertensive -Utilize scheduled IV Lopressor with holding parameters and when necessary IV hydralazine -Preadmission Cozaar, Imdur, carvedilol on hold  CAD of autologous vein bypass graft -Patient somnolent/obtunded obtain troponin -Preadmission statin, Plavix, Imdur as well as carvedilol on hold while NPO  Chronic systolic and diastolic heart failure ([place]) -Currently compensated but use caution with administration of IV fluids since ejection fraction is 40% and last echocardiogram March 2015 revealed severe hypokinesis of the inferior wall with left ventricular dilatation -Echocardiogram 10/21; slightly worsening EF see results below -Holding medications as above including Lasix -Strict in and out -Daily a.m. weight   Chronic pain syndrome -due to ongoing peripheral neuropathy and apparent lumbar compression fracture -On methadone, Norco, morphine sulfate at home but currently holding all sedative medications due to acute metabolic encephalopathy -Monitor for pain   Diabetes mellitus type II, controlled ([place]) -Current CBGs greater than 200 and likely related to acute infectious processes -10/20 Hemoglobin A1c= 6.6 -Sensitive SSI   Pulmonary HTN (HCC)/COPD -Severe with most recent reading of 83 mmHg in  2015 -Imdur on hold   HYPERCHOLESTEROLEMIA -Statin on hold   Iron deficiency anemia -Current hemoglobin 15.2 with baseline hemoglobin around 13 consistent with hemoconcentration and volume depletion   Peripheral neuropathy (HCC) -Preadmission narcotics and gabapentin on hold   BLINDNESS -Secondary to diabetic retinopathy   Pacemaker -Currently not actively pacing     Code Status: FULL Family Communication: no family present at time of exam Disposition Plan: Resolution sepsis    Consultants:   Procedure/Significant Events: 10/27/2013 echocardiogram;- LVEF= Inferior wall severely hypokinetic; moderately dilated.  -LVEF= 40%.- Left atrium: moderately dilated. - Pulmonary arteries: PA peak pressure: 31mm Hg (S). 10/21 echocardiogram; LVEF= 30% - 35%. Diffuse hypokinesis.-(grade 1 diastolic dysfunction).     Culture 10/20 blood left/right AC positive GPC in clusters] 10/20 MRSA by PCR negative   Antibiotics: Zosyn 10/20>> Vancomycin 10/20>>   DVT prophylaxis: Lovenox   Devices    LINES / TUBES:      Continuous Infusions: . 0.9 % NaCl with KCl 20 mEq / L 100 mL/hr at 05/28/15 1800    Objective: VITAL SIGNS: Temp: 97.6 F (36.4 C) (10/21 1600) Temp Source: Axillary (10/21 1600) BP: 156/60 mmHg (10/21 1522) Pulse Rate: 88 (10/21 1522) SPO2; FIO2:   Intake/Output Summary (Last 24 hours) at 05/28/15 1907 Last data filed at 05/28/15 1800  Gross per 24 hour  Intake   2695 ml  Output   1130 ml  Net   1565 ml     Exam: General: Eyes open, A/O 0 (if patient speaks at all just says yes),  No acute respiratory distress Eyes: negative scleral hemorrhage ENT: Negative Runny nose, gingival bleeding, Neck:  Negative scars, masses, torticollis, lymphadenopathy, JVD Lungs: Clear to auscultation bilaterally without wheezes or crackles Cardiovascular: Regular rate and rhythm without murmur gallop or rub normal S1 and S2  Abdomen:negative abdominal  pain, positive distention, positive soft, bowel sounds, no rebound, no ascites, no appreciable mass Extremities: positive bilateral significant cyanosis Lt>>Rt , multiple left metatarsals gangrenous ulcers. Psychiatric:  Unable to assess  Neurologic:  Withdraws R LE/RUE/LUE to painful stimuli.    Data Reviewed: Basic Metabolic Panel:  Recent Labs Lab 05/27/15 0920 05/27/15 1650 05/28/15 0410  NA 138  --  143  K 3.3*  --  4.0  CL 99*  --  105  CO2 26  --  24  GLUCOSE 238*  --  153*  BUN 19  --  23*  CREATININE 1.27*  --  1.30*  CALCIUM 9.1  --  8.4*  MG  --  2.1  --   PHOS  --  3.4  --    Liver Function Tests:  Recent Labs Lab 05/27/15 0920 05/28/15 0410  AST 26 62*  ALT 23 49  ALKPHOS 69 55  BILITOT 0.8 0.7  PROT 7.3 6.1*  ALBUMIN 3.9 2.9*   No results for input(s): LIPASE, AMYLASE in the last 168 hours. No results for input(s): AMMONIA in the last 168 hours. CBC:  Recent Labs Lab 05/27/15 0920 05/28/15 0410  WBC 7.5 8.3  NEUTROABS 6.7  --   HGB 15.2 15.6  HCT 45.8 47.6  MCV 92.0 93.0  PLT 126* 90*   Cardiac Enzymes: No results for input(s): CKTOTAL, CKMB, CKMBINDEX, TROPONINI in the last 168 hours. BNP (last 3 results) No results for input(s): BNP in the last 8760 hours.  ProBNP (last 3 results)  Recent Labs  08/25/14 1041  PROBNP 179.0*    CBG:  Recent Labs Lab 05/28/15 0117 05/28/15 0356 05/28/15 0740 05/28/15 1203 05/28/15 1648  GLUCAP 190* 170* 132* 157* 155*    Recent Results (from the past 240 hour(s))  Blood Culture (routine x 2)     Status: None (Preliminary result)   Collection Time: 05/27/15  9:20 AM  Result Value Ref Range Status   Specimen Description BLOOD LEFT ANTECUBITAL  Final   Special Requests BOTTLES DRAWN AEROBIC AND ANAEROBIC 5ML  Final   Culture  Setup Time   Final    GRAM POSITIVE COCCI IN CLUSTERS IN BOTH AEROBIC AND ANAEROBIC BOTTLES CRITICAL RESULT CALLED TO, READ BACK BY AND VERIFIED WITH: R TARWATER  RN 2250 05/27/15 A BROWNING    Culture GRAM POSITIVE COCCI  Final   Report Status PENDING  Incomplete  Blood Culture (routine x 2)     Status: None (Preliminary result)   Collection Time: 05/27/15  9:39 AM  Result Value Ref Range Status   Specimen Description BLOOD RIGHT ANTECUBITAL  Final   Special Requests BOTTLES DRAWN AEROBIC AND ANAEROBIC 5ML  Final   Culture  Setup Time   Final    GRAM POSITIVE COCCI IN CLUSTERS IN BOTH AEROBIC AND ANAEROBIC BOTTLES CRITICAL RESULT CALLED TO, READ BACK BY AND VERIFIED WITH: Jodi Marble RN [id number] 05/27/15 A BROWNING    Culture GRAM POSITIVE COCCI  Final   Report Status PENDING  Incomplete  Urine culture     Status: None   Collection Time: 05/27/15  9:52 AM  Result Value Ref Range Status   Specimen Description URINE, CLEAN CATCH  Final   Special Requests NONE  Final   Culture NO GROWTH 1 DAY  Final   Report Status 05/28/2015 FINAL  Final  MRSA PCR Screening     Status: None   Collection Time: 05/27/15  4:25 PM  Result Value Ref Range  Status   MRSA by PCR NEGATIVE NEGATIVE Final    Comment:        The GeneXpert MRSA Assay (FDA approved for NASAL specimens only), is one component of a comprehensive MRSA colonization surveillance program. It is not intended to diagnose MRSA infection nor to guide or monitor treatment for MRSA infections.      Studies:  Recent x-ray studies have been reviewed in detail by the Attending Physician  Scheduled Meds:  Scheduled Meds: . enoxaparin (LOVENOX) injection  40 mg Subcutaneous Q24H  . insulin aspart  0-9 Units Subcutaneous 6 times per day  . metoCLOPramide (REGLAN) injection  5 mg Intravenous 4 times per day  . piperacillin-tazobactam (ZOSYN)  IV  3.375 g Intravenous Q8H  . vancomycin  750 mg Intravenous Q12H    Time spent on care of this patient: 40 mins   Layman Gully, Geraldo Docker , MD  [facility] Office  [phone] Pager - [phone]  On-Call/Text Page:      [email or web address]       password [email or web address]  If 7PM-7AM, please contact night-coverage [email or web address] Password [email or web address] 05/28/2015, 7:07 PM   LOS: 1 day   Care during the described time interval was provided by me .  I have reviewed this patient's available data, including medical history, events of note, physical examination, and all test results as part of my evaluation. I have personally reviewed and interpreted all radiology studies.   Dia Crawford, MD [phone] Pager

## 2015-05-28 NOTE — Progress Notes (Signed)
Echocardiogram 2D Echocardiogram with Definity has been performed.  Mark French 05/28/2015, 4:14 PM

## 2015-05-28 NOTE — Consult Note (Signed)
Consultation Note Date: 05/28/2015   Patient Name: Mark French  DOB: [birth year]  MRN: [id number]  Age / Sex: 69 y.o., male   PCP: Mark Shin, MD Referring Physician: Allie Bossier, MD  Reason for Consultation: Establishing goals of care, symptom mgt  Palliative Care Assessment and Plan Summary of Established Goals of Care and Medical Treatment Preferences   Clinical Assessment/Narrative: Pt is a 69 yo man with multiple medical co-morbidities: DM, CVA, CAD with CABG, HTN, left-eye blindness,  compression FX, , childhood stroke, complete heart block requiring pacemaker, CKD III, who suddenly developed fever of 102.3, and decreased LOC and was brought to ED. He has been very lethargic. He has also been vomiting and BIPAP removed because of this. He had vomited again this am when I went into the room. Found pt with vomitus coming out of his mouth and in  the bed. Pt is not talking. He did shake his head "yes" when I asked him if he was [facility], but otherwise did not communicate with me. Met with spouse. . Pt also not speaking with her, but is attempting to mumble . She has not noticed any trouble swallowing at home. His baseline is only walking a few steps, but is primarily in a motorized WC. He has chronic back pain 2/2 compression fx. Pt also has been under the care of hospice in the past but was recently dc'd 2/2 lack of decline. Wife also shares that he has been having a lot of abd pain without n/v for the past year. He is on PPI and hyoscyamine PRN. I discussed with wife that he appears to have some sort of infectious process going on and that further work up is pending. Pt has a lactic acid level of 2.4  Contacts/Participants in Discussion: Primary Decision Maker: Wife   HCPOA: yes  Met with wife. They have no children  Code Status/Advance Care Planning:  Partial: Administer pressors if needed as well as BIPAP, but no CPR, defibrillation or ventilation. Will clarify going forward if he is  DNR in community  Continue with work up, with goal to treat the treatable such as antibiotics. At this point wife hopeful for improvement and is waiting to see if supportive care, fluids, antibiotics can help him to improve. Not ready for comfort care  In agreement with MRI to see if he has had another stroke  In agreement with NG tube for nausea. I did discuss my concern that he might have already aspirated on top of presenting symptoms of infection.  Symptom Management:   Dyspnea: Added low dose MS04 1-2 iv q 3 prn  Pain: Pt has chronic back pain. Adm prn ms04 and monitor for need for scheduled dosing  Nausea; Cont sch reglan, will add prn zofran. Pt has order for prn phenergan 6.25 mg prn for nausea as well but maybe more sedating  Palliative Prophylaxis: bowel regimen  Additional Recommendations (Limitations, Scope, Preferences):  Depending on clinical coarse will monitor for appropriateness of resuming hospice services Psycho-social/Spiritual:   Support System: yes. Church and spouse  Desire for further Chaplaincy support:no  Prognosis: < 6 months  Discharge Planning:  TBD. Home with home health or hospice       Chief Complaint/History of Present Illness: Pt is a 69 yo man admitted after abrupt onset of decreased LOC and fever  Primary Diagnoses  Present on Admission:  . (Resolved) Sepsis ([place]) . AKI (acute kidney injury) ([place]) . Thrombocytopenia ([place]) . (Resolved) Acute on chronic  combined systolic and diastolic heart failure ([place]) . Diabetes mellitus type II, uncontrolled ([place]) . HYPERCHOLESTEROLEMIA . Iron deficiency anemia . BLINDNESS . HTN (hypertension) . Coronary atherosclerosis of autologous vein bypass graft . Chronic systolic heart failure ([place]) . Chronic kidney disease, stage III (moderate) . Pacemaker . Chronic pain syndrome . FUO (fever of unknown origin) . Metabolic encephalopathy . Pulmonary HTN ([place])  Palliative Review of  Systems: lethargic I have reviewed the medical record, interviewed the patient and family, and examined the patient. The following aspects are pertinent.  Past Medical History  Diagnosis Date  . CORONARY ARTERY DISEASE     a. s/p CABG 1998. b.  08/2011: inferior STEMI.  LHC 09/07/11: Severe 3v CAD, LIMA-LAD patent, SVG-RCA chronically occluded, SVG-circumflex chronically occluded, SVG-diagonal 95%. PCI: Promus DES to the SVG-diagonal.  Procedure c/b transient CHB req CPR and TTVP. c. Myoview 11/2011: scar but no ischemia.   [name] DIABETES MELLITUS, TYPE I   . HYPERTENSION   . 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 HYPERCHOLESTEROLEMIA   . CERVICAL RADICULOPATHY, LEFT   . PERIPHERAL NEUROPATHY   . Chronic systolic heart failure (HCC)     Echocardiogram 08/09/13:Cavity size normal, EF 40%, inferior hypokinesis, grade 1 diastolic dysfunction, paradoxical septal motion, mild MR, mild LAE, no PFO, PAS 51, moderate pulm HTN  . ED (erectile dysfunction)   . Hypogonadism male   . GI bleed 2012    Multiple Duodenal ulcer   . Obesity   . Legally blind     bilaterally  . Stroke [facility]) 1945    age 29 months; residual right sided weakness  . History of blood transfusion 2012  . Charcot foot due to diabetes mellitus (HCC)     chronic pain  . Osteoarthritis     right hip and shoulder  . COPD (chronic obstructive pulmonary disease) ([place])   . 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 Hiatal hernia   . Chronic stasis dermatitis     Chronic stasis changes.  . CKD (chronic kidney disease), stage III   . Syncope     a. 07/2013: suspected due to hypotension.  . Biventricular cardiac pacemaker in situ     a. [address] [employer] [place] RF model [id number] (serial number [id number])  . Complete heart block (HCC)     a. s/p pacemaker placement on 08/2013 admission  . Duodenal ulcer    Social History   Social History  . Marital Status: Married    Spouse Name: N/A  . Number of  Children: 0  . Years of Education: N/A   Occupational History  . Disabled    Social History Main Topics  . Smoking status: Never Smoker   . Smokeless tobacco: Never Used  . Alcohol Use: No  . Drug Use: No  . Sexual Activity: No   Other Topics Concern  . None   Social History Narrative   Comes to appointments with wife      0 Caffeine drinks daily    Family History  Problem Relation Age of Onset  . CAD Mother     Coronary Artery Disease  . CAD Father     Coronary Artery Disease  . CAD Paternal Grandmother     Coronary Artery Disease  . Colon cancer Neg Hx   . Diabetes Maternal Grandmother   . Cancer Maternal Grandmother     male cancer ? type  . Diabetes Brother    Scheduled Meds: . enoxaparin (LOVENOX) injection  40  mg Subcutaneous Q24H  . insulin aspart  0-9 Units Subcutaneous 6 times per day  . metoCLOPramide (REGLAN) injection  5 mg Intravenous 4 times per day  . piperacillin-tazobactam (ZOSYN)  IV  3.375 g Intravenous Q8H  . vancomycin  750 mg Intravenous Q12H   Continuous Infusions: . 0.9 % NaCl with KCl 20 mEq / L 100 mL/hr at 05/28/15 1200   PRN Meds:.acetaminophen **OR** acetaminophen, morphine injection, ondansetron (ZOFRAN) IV, promethazine Medications Prior to Admission:  Prior to Admission medications   Medication Sig Start Date End Date Taking? Authorizing Provider  AMBULATORY NON FORMULARY MEDICATION Pt on O2 - 2 liter   Yes Historical Provider, MD  aspirin EC 81 MG tablet Take 81 mg by mouth at bedtime.   Yes Historical Provider, MD  atorvastatin (LIPITOR) 40 MG tablet TAKE 1 TABLET BY MOUTH EVERY DAY 04/21/15  Yes Sherren Mocha, MD  Blood Glucose Monitoring Suppl (BAYER BREEZE 2 SYSTEM) W/DEVICE KIT 1 Device by Does not apply route once. 11/20/14  Yes Mark Shin, MD  calcitRIOL (ROCALTROL) 0.25 MCG capsule TAKE 1 CAPSULE BY MOUTH EVERY DAY 05/24/15  Yes Mark Shin, MD  carvedilol (COREG) 3.125 MG tablet TAKE 1 TABLET BY MOUTH TWICE DAILY WITH  A MEAL 12/15/14  Yes Thompson Grayer, MD  clopidogrel (PLAVIX) 75 MG tablet TAKE 1 TABLET BY MOUTH EVERY DAY 02/24/15  Yes Sherren Mocha, MD  co-enzyme Q-10 30 MG capsule Take 30 mg by mouth daily. @@ 2pm   Yes Historical Provider, MD  docusate sodium (COLACE) 100 MG capsule Take 300 mg by mouth daily.    Yes Historical Provider, MD  famotidine (PEPCID) 20 MG tablet Take 20 mg by mouth before cath procedure. stomach   Yes Historical Provider, MD  famotidine (PEPCID) 20 MG tablet Take 20 mg by mouth daily as needed. indigestion 04/29/15  Yes Historical Provider, MD  furosemide (LASIX) 40 MG tablet TAKE 2 TABLETS BY MOUTH TWICE DAILY 02/24/15  Yes Sherren Mocha, MD  gabapentin (NEURONTIN) 400 MG capsule Take 2 capsules (800 mg total) by mouth 3 (three) times daily. 04/16/15  Yes Mark Shin, MD  HYDROcodone-acetaminophen (NORCO) 10-325 MG tablet Take 1-2 tablets by mouth every 4 (four) hours as needed for moderate pain. 05/14/15  Yes Mark Shin, MD  hyoscyamine (LEVSIN SL) 0.125 MG SL tablet DISSOLVE 1 TABLET UNDER THE TONGUE EVERY 4 HOURS AS NEEDED 03/25/15  Yes Lafayette Dragon, MD  isosorbide mononitrate (IMDUR) 30 MG 24 hr tablet TAKE 1 TABLET BY MOUTH EVERY DAY 01/06/15  Yes Sherren Mocha, MD  LORazepam (ATIVAN) 0.5 MG tablet Take 1 tablet (0.5 mg total) by mouth every 6 (six) hours as needed for anxiety (anxiety). 04/21/15  Yes Mark Shin, MD  losartan (COZAAR) 50 MG tablet TAKE 1/2 TABLET BY MOUTH EVERY DAY 09/10/14  Yes Sherren Mocha, MD  methadone (DOLOPHINE) 5 MG tablet Take 1 tablet (5 mg total) by mouth 2 (two) times daily. 05/14/15  Yes Mark Shin, MD  Morphine Sulfate (MORPHINE CONCENTRATE) 10 mg / 0.5 ml concentrated solution Place 0.25-0.5 mLs (5-10 mg total) under the tongue every 2 (two) hours as needed for moderate pain, severe pain, anxiety or shortness of breath. 10/30/13  Yes Debbe Odea, MD  nitroGLYCERIN (NITROSTAT) 0.4 MG SL tablet Place 1 tablet (0.4 mg total) under the tongue every 5  (five) minutes as needed for chest pain. 10/29/12  Yes Sherren Mocha, MD  OVER THE COUNTER MEDICATION Place 1 drop into both eyes daily as needed (dry  eyes). [facility] eye drops   Yes Historical Provider, MD  pantoprazole (PROTONIX) 40 MG tablet Take 1 tablet (40 mg total) by mouth 2 (two) times daily. 05/14/15  Yes Manus Gunning, MD  polyethylene glycol powder [facility]) powder Take 1 Container by mouth daily.  08/18/14  Yes Historical Provider, MD  polyethylene glycol powder (GLYCOLAX/MIRALAX) powder DISSOLVE 9 GRAMS( 1/2 CAPFUL) IN AT LEAST 8 OUNCES OF WATER/ JUICE AND DRINK THREE TIMES WEEKLY 03/02/15  Yes Lafayette Dragon, MD  potassium chloride SA (K-DUR,KLOR-CON) 20 MEQ tablet TAKE 1 TABLET BY MOUTH TWICE DAILY 02/05/15  Yes Sherren Mocha, MD  Sennosides-Docusate Sodium (SENNA PLUS PO) Take 1-4 capsules by mouth every morning. 03/25/15  Yes Historical Provider, MD  tamsulosin (FLOMAX) 0.4 MG CAPS capsule TAKE 1 CAPSULE BY MOUTH DAILY 05/17/15  Yes Mark Shin, MD  tolnaftate (ANTI-FUNGAL) 1 % powder Apply 1 application topically 3 (three) times a week. As needed for infection   Yes Historical Provider, MD  dicyclomine (BENTYL) 20 MG tablet Take 1 tablet (20 mg total) by mouth 2 (two) times daily. Patient not taking: Reported on 05/27/2015 12/23/14   Lafayette Dragon, MD  hyoscyamine (LEVSIN SL) 0.125 MG SL tablet DISSOLVE 1 TABLET UNDER THE TONGUE EVERY 4 HOURS AS NEEDED Patient not taking: Reported on 05/27/2015 05/04/15   Manus Gunning, MD   Allergies  Allergen Reactions  . Sildenafil Nausea Only and Other (See Comments)    "took it once; heart went to pieces; never took it again"  . Percocet [Oxycodone-Acetaminophen] Other (See Comments)    Unknown reaction-possible altered mental state-per wife.    . Contrast Media [Iodinated Diagnostic Agents] Nausea Only   CBC:    Component Value Date/Time   WBC 8.3 05/28/2015 0410   HGB 15.6 05/28/2015 0410   HCT 47.6 05/28/2015  0410   PLT 90* 05/28/2015 0410   MCV 93.0 05/28/2015 0410   NEUTROABS 6.7 05/27/2015 0920   LYMPHSABS 0.2* 05/27/2015 0920   MONOABS 0.6 05/27/2015 0920   EOSABS 0.0 05/27/2015 0920   BASOSABS 0.0 05/27/2015 0920   Comprehensive Metabolic Panel:    Component Value Date/Time   NA 143 05/28/2015 0410   K 4.0 05/28/2015 0410   CL 105 05/28/2015 0410   CO2 24 05/28/2015 0410   BUN 23* 05/28/2015 0410   CREATININE 1.30* 05/28/2015 0410   GLUCOSE 153* 05/28/2015 0410   CALCIUM 8.4* 05/28/2015 0410   CALCIUM 8.7 12/15/2010 1645   AST 62* 05/28/2015 0410   ALT 49 05/28/2015 0410   ALKPHOS 55 05/28/2015 0410   BILITOT 0.7 05/28/2015 0410   PROT 6.1* 05/28/2015 0410   ALBUMIN 2.9* 05/28/2015 0410    Physical Exam: Vital Signs: BP 157/58 mmHg  Pulse 77  Temp(Src) 100.4 F (38 C) (Axillary)  Resp 18  Ht _0  (1.549 m)  Wt 86.183 kg (190 lb)  SpO2 95% SpO2: SpO2: 95 % O2 Device: O2 Device: Nasal Cannula O2 Flow Rate: O2 Flow Rate (L/min): 6 L/min Intake/output summary:  Intake/Output Summary (Last 24 hours) at 05/28/15 1330 Last data filed at 05/28/15 1200  Gross per 24 hour  Intake   2095 ml  Output    775 ml  Net   1320 ml   LBM: Last BM Date: 05/27/15 Baseline Weight: Weight: 86.183 kg (190 lb) Most recent weight: Weight: 86.183 kg (190 lb)  Exam Findings:  General: Older man, lethargic, non-verbal Resp: No increased work of breathing Neuro: Lethargic, non-verbal but did shake  his head 1x in response to a questions         Palliative Performance Scale: 30%              Additional Data Reviewed: Recent Labs     05/27/15  0920  05/28/15  0410  WBC  7.5  8.3  HGB  15.2  15.6  PLT  126*  90*  NA  138  143  BUN  19  23*  CREATININE  1.27*  1.30*     Time In: 1100 Time Out: 1220 Time Total: 80 min Greater than 50%  of this time was spent counseling and coordinating care related to the above assessment and plan.  Signed by: Dory Horn,  NP  Dory Horn, NP  05/28/2015, 1:30 PM  Please contact Palliative Medicine Team phone at [phone] for questions and concerns.

## 2015-05-28 NOTE — Progress Notes (Signed)
Initial Nutrition Assessment  DOCUMENTATION CODES:   Not applicable  INTERVENTION:   Diet advancement per physician and/or SLP as able.  NUTRITION DIAGNOSIS:   Inadequate oral intake related to inability to eat, lethargy/confusion as evidenced by NPO status.  GOAL:   Patient will meet greater than or equal to 90% of their needs  MONITOR:   Diet advancement, PO intake, Labs, Skin  REASON FOR ASSESSMENT:   Low [place]    ASSESSMENT:   69 year old male patient with a history of diabetes controlled with sliding scale insulin and diet, chronic combined systolic and diastolic heart failure with associated severe pulmonary hypertension, history of CAD with prior CABG procedure, hypertension, legally blind from diabetic retinopathy, dyslipidemia, severe peripheral neuropathy, reported compression fracture low back with chronic pain, childhood stroke 1945, chronic stasis dermatitis lower extremities, history of complete heart block requiring pacemaker placement, and chronic kidney disease. Patient was sent to the ER by his wife via EMS after developing fevers that began yesterday afternoon.   Patient unable to provide any nutrition hx at this time. Patient's wife reports patient with good intake PTA, weight gradually trending down, "but that's a good thing," she said. Per review of usual weights, he has lost ~3% of usual weight in the past 6 months. Nutrition-Focused physical exam completed. Findings are no fat depletion and mild-moderate muscle depletion. Patient with nausea and vomiting this AM. Remains NPO. Plans to place an NGT to suction and KUB. Plans for Palliative Care consult to discuss goals of care with wife.  Diet Order:  Diet NPO time specified  Skin:  Wound (see comment) (stage 2 to coccyx; black eschar to left foot/toes)  Last BM:  10/20  Height:   Ht Readings from Last 1 Encounters:  05/28/15 5\' 1"  (1.549 m)    Weight:   Wt Readings from Last 1 Encounters:   05/27/15 190 lb (86.183 kg)    Ideal Body Weight:  50.9 kg  BMI:  Body mass index is 35.92 kg/(m^2).  Estimated Nutritional Needs:   Kcal:  1800-2000  Protein:  90-110 gm  Fluid:  2 L  EDUCATION NEEDS:   No education needs identified at this time  Molli Barrows, [place], [place], [place] Pager [phone] After Hours Pager [phone]

## 2015-05-28 NOTE — Progress Notes (Signed)
Patient can not have MRI due to [facility] Pacemaker not being MRI compatiable

## 2015-05-28 NOTE — Progress Notes (Signed)
Called Mark French at [phone] in an effort to set up [facility] meeting. Chart reviewed. Seen by PMT in March for hospice.  Pt unable to participate in discussion this am. Remains nauseated, vomiting, lethargic. Will continue to follow up for appt. Romona Curls, ANP

## 2015-05-29 ENCOUNTER — Inpatient Hospital Stay (HOSPITAL_COMMUNITY): Payer: Commercial Managed Care - HMO

## 2015-05-29 DIAGNOSIS — I10 Essential (primary) hypertension: Secondary | ICD-10-CM

## 2015-05-29 DIAGNOSIS — K560 Paralytic ileus: Secondary | ICD-10-CM

## 2015-05-29 DIAGNOSIS — E876 Hypokalemia: Secondary | ICD-10-CM | POA: Diagnosis present

## 2015-05-29 DIAGNOSIS — Z515 Encounter for palliative care: Secondary | ICD-10-CM

## 2015-05-29 DIAGNOSIS — L8990 Pressure ulcer of unspecified site, unspecified stage: Secondary | ICD-10-CM | POA: Diagnosis present

## 2015-05-29 DIAGNOSIS — I255 Ischemic cardiomyopathy: Secondary | ICD-10-CM | POA: Diagnosis present

## 2015-05-29 DIAGNOSIS — R410 Disorientation, unspecified: Secondary | ICD-10-CM

## 2015-05-29 DIAGNOSIS — R0603 Acute respiratory distress: Secondary | ICD-10-CM | POA: Diagnosis present

## 2015-05-29 DIAGNOSIS — K567 Ileus, unspecified: Secondary | ICD-10-CM | POA: Diagnosis not present

## 2015-05-29 DIAGNOSIS — R0600 Dyspnea, unspecified: Secondary | ICD-10-CM

## 2015-05-29 DIAGNOSIS — N183 Chronic kidney disease, stage 3 (moderate): Secondary | ICD-10-CM

## 2015-05-29 DIAGNOSIS — D696 Thrombocytopenia, unspecified: Secondary | ICD-10-CM

## 2015-05-29 DIAGNOSIS — R935 Abnormal findings on diagnostic imaging of other abdominal regions, including retroperitoneum: Secondary | ICD-10-CM

## 2015-05-29 DIAGNOSIS — A4101 Sepsis due to Methicillin susceptible Staphylococcus aureus: Secondary | ICD-10-CM

## 2015-05-29 DIAGNOSIS — E118 Type 2 diabetes mellitus with unspecified complications: Secondary | ICD-10-CM | POA: Diagnosis present

## 2015-05-29 DIAGNOSIS — T827XXA Infection and inflammatory reaction due to other cardiac and vascular devices, implants and grafts, initial encounter: Secondary | ICD-10-CM | POA: Diagnosis present

## 2015-05-29 DIAGNOSIS — I272 Other secondary pulmonary hypertension: Secondary | ICD-10-CM

## 2015-05-29 DIAGNOSIS — I82403 Acute embolism and thrombosis of unspecified deep veins of lower extremity, bilateral: Secondary | ICD-10-CM

## 2015-05-29 DIAGNOSIS — I5043 Acute on chronic combined systolic (congestive) and diastolic (congestive) heart failure: Secondary | ICD-10-CM

## 2015-05-29 DIAGNOSIS — I442 Atrioventricular block, complete: Secondary | ICD-10-CM | POA: Diagnosis present

## 2015-05-29 DIAGNOSIS — B9561 Methicillin susceptible Staphylococcus aureus infection as the cause of diseases classified elsewhere: Secondary | ICD-10-CM

## 2015-05-29 DIAGNOSIS — H548 Legal blindness, as defined in USA: Secondary | ICD-10-CM

## 2015-05-29 LAB — CULTURE, BLOOD (ROUTINE X 2)

## 2015-05-29 LAB — CBC WITH DIFFERENTIAL/PLATELET
BASOS ABS: 0 10*3/uL (ref 0.0–0.1)
BASOS PCT: 0 %
EOS PCT: 0 %
Eosinophils Absolute: 0 10*3/uL (ref 0.0–0.7)
HEMATOCRIT: 53.3 % — AB (ref 39.0–52.0)
Hemoglobin: 17.9 g/dL — ABNORMAL HIGH (ref 13.0–17.0)
LYMPHS PCT: 8 %
Lymphs Abs: 0.6 10*3/uL — ABNORMAL LOW (ref 0.7–4.0)
MCH: 30.9 pg (ref 26.0–34.0)
MCHC: 33.6 g/dL (ref 30.0–36.0)
MCV: 92.1 fL (ref 78.0–100.0)
MONO ABS: 0.8 10*3/uL (ref 0.1–1.0)
Monocytes Relative: 11 %
NEUTROS ABS: 5.8 10*3/uL (ref 1.7–7.7)
NEUTROS PCT: 81 %
PLATELETS: 80 10*3/uL — AB (ref 150–400)
RBC: 5.79 MIL/uL (ref 4.22–5.81)
RDW: 14.2 % (ref 11.5–15.5)
WBC: 7.1 10*3/uL (ref 4.0–10.5)

## 2015-05-29 LAB — COMPREHENSIVE METABOLIC PANEL
ALBUMIN: 2.6 g/dL — AB (ref 3.5–5.0)
ALT: 59 U/L (ref 17–63)
ANION GAP: 17 — AB (ref 5–15)
AST: 70 U/L — ABNORMAL HIGH (ref 15–41)
Alkaline Phosphatase: 52 U/L (ref 38–126)
BUN: 20 mg/dL (ref 6–20)
CHLORIDE: 103 mmol/L (ref 101–111)
CO2: 29 mmol/L (ref 22–32)
Calcium: 8.6 mg/dL — ABNORMAL LOW (ref 8.9–10.3)
Creatinine, Ser: 1.48 mg/dL — ABNORMAL HIGH (ref 0.61–1.24)
GFR calc non Af Amer: 46 mL/min — ABNORMAL LOW (ref >60)
GFR, EST AFRICAN AMERICAN: 53 mL/min — AB (ref >60)
GLUCOSE: 200 mg/dL — AB (ref 65–99)
Potassium: 2.8 mmol/L — ABNORMAL LOW (ref 3.5–5.1)
SODIUM: 149 mmol/L — AB (ref 135–145)
Total Bilirubin: 1 mg/dL (ref 0.3–1.2)
Total Protein: 6.5 g/dL (ref 6.5–8.1)

## 2015-05-29 LAB — BLOOD GAS, ARTERIAL
ACID-BASE EXCESS: 5.7 mmol/L — AB (ref 0.0–2.0)
Bicarbonate: 29.6 mEq/L — ABNORMAL HIGH (ref 20.0–24.0)
DRAWN BY: 331761
FIO2: 1
O2 SAT: 96.9 %
PATIENT TEMPERATURE: 98.1
TCO2: 30.8 mmol/L (ref 0–100)
pCO2 arterial: 41.3 mmHg (ref 35.0–45.0)
pH, Arterial: 7.467 — ABNORMAL HIGH (ref 7.350–7.450)
pO2, Arterial: 134 mmHg — ABNORMAL HIGH (ref 80.0–100.0)

## 2015-05-29 LAB — GLUCOSE, CAPILLARY
GLUCOSE-CAPILLARY: 173 mg/dL — AB (ref 65–99)
GLUCOSE-CAPILLARY: 208 mg/dL — AB (ref 65–99)
GLUCOSE-CAPILLARY: 160 mg/dL — AB (ref 65–99)
GLUCOSE-CAPILLARY: 147 mg/dL — AB (ref 65–99)
GLUCOSE-CAPILLARY: 140 mg/dL — AB (ref 65–99)
Glucose-Capillary: 173 mg/dL — ABNORMAL HIGH (ref 65–99)

## 2015-05-29 LAB — MAGNESIUM
MAGNESIUM: 2.3 mg/dL (ref 1.7–2.4)
MAGNESIUM: 2.5 mg/dL — AB (ref 1.7–2.4)

## 2015-05-29 LAB — TROPONIN I: Troponin I: 1.13 ng/mL (ref <0.031)

## 2015-05-29 LAB — POTASSIUM: Potassium: 4 mmol/L (ref 3.5–5.1)

## 2015-05-29 LAB — LIPASE, BLOOD: Lipase: 37 U/L (ref 11–51)

## 2015-05-29 LAB — AMYLASE: Amylase: 53 U/L (ref 28–100)

## 2015-05-29 MED ORDER — FUROSEMIDE 10 MG/ML IJ SOLN
INTRAMUSCULAR | Status: AC
  Filled 2015-05-29: qty 8

## 2015-05-29 MED ORDER — FUROSEMIDE 10 MG/ML IJ SOLN
INTRAMUSCULAR | Status: AC
  Filled 2015-05-29: qty 4

## 2015-05-29 MED ORDER — METOPROLOL TARTRATE 1 MG/ML IV SOLN
10.0000 mg | Freq: Once | INTRAVENOUS | Status: AC
  Administered 2015-05-29: 10 mg via INTRAVENOUS

## 2015-05-29 MED ORDER — POTASSIUM CL IN DEXTROSE 5% 20 MEQ/L IV SOLN
20.0000 meq | INTRAVENOUS | Status: DC
  Administered 2015-05-29: 20 meq via INTRAVENOUS
  Filled 2015-05-29 (×2): qty 1000

## 2015-05-29 MED ORDER — FUROSEMIDE 10 MG/ML IJ SOLN
40.0000 mg | Freq: Once | INTRAMUSCULAR | Status: AC
  Administered 2015-05-29: 40 mg via INTRAVENOUS

## 2015-05-29 MED ORDER — CEFAZOLIN SODIUM-DEXTROSE 2-3 GM-% IV SOLR
2.0000 g | Freq: Three times a day (TID) | INTRAVENOUS | Status: DC
  Filled 2015-05-29 (×2): qty 50

## 2015-05-29 MED ORDER — METOPROLOL TARTRATE 1 MG/ML IV SOLN
INTRAVENOUS | Status: AC
  Filled 2015-05-29: qty 10

## 2015-05-29 MED ORDER — CEFTRIAXONE SODIUM 2 G IJ SOLR
2.0000 g | INTRAMUSCULAR | Status: DC
  Administered 2015-05-29 – 2015-06-01 (×4): 2 g via INTRAVENOUS
  Filled 2015-05-29 (×6): qty 2

## 2015-05-29 MED ORDER — POTASSIUM CHLORIDE 10 MEQ/100ML IV SOLN
10.0000 meq | INTRAVENOUS | Status: AC
Start: 2015-05-29 — End: 2015-05-29
  Administered 2015-05-29 (×4): 10 meq via INTRAVENOUS
  Filled 2015-05-29 (×4): qty 100

## 2015-05-29 MED ORDER — FUROSEMIDE 10 MG/ML IJ SOLN
80.0000 mg | Freq: Once | INTRAMUSCULAR | Status: AC
  Administered 2015-05-29: 80 mg via INTRAVENOUS

## 2015-05-29 NOTE — Consult Note (Signed)
[facility]    Date of Admission:  05/27/2015  Date of Consult:  05/29/2015  Reason for Consult: Staphylococcus aureus bacteremia and pacemaker infection Referring Physician: Dr. Sherral Hammers   HPI: Mark French is an 69 y.o. male with multiple comorbidities, DM 1 with PVD, CAD, status post cardiac artery bypass grafting in 2013 with pacemaker at that time and then complete heart block status post biventricular pacemaker placement in 2015 and has been admitted with sepsis and now found to have Staphylococcus aureus bacteremia. He is also suffering from an ileus and is persistently confused though improved compared to admission per the primary team.     Past Medical History  Diagnosis Date  . CORONARY ARTERY DISEASE     a. s/p CABG 1998. b.  08/2011: inferior STEMI.  LHC 09/07/11: Severe 3v CAD, LIMA-LAD patent, SVG-RCA chronically occluded, SVG-circumflex chronically occluded, SVG-diagonal 95%. PCI: Promus DES to the SVG-diagonal.  Procedure c/b transient CHB req CPR and TTVP. c. Myoview 11/2011: scar but no ischemia.   [name] DIABETES MELLITUS, TYPE I   . HYPERTENSION   . 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 HYPERCHOLESTEROLEMIA   . CERVICAL RADICULOPATHY, LEFT   . PERIPHERAL NEUROPATHY   . Chronic systolic heart failure (HCC)     Echocardiogram 08/09/13:Cavity size normal, EF 40%, inferior hypokinesis, grade 1 diastolic dysfunction, paradoxical septal motion, mild MR, mild LAE, no PFO, PAS 51, moderate pulm HTN  . ED (erectile dysfunction)   . Hypogonadism male   . GI bleed 2012    Multiple Duodenal ulcer   . Obesity   . Legally blind     bilaterally  . Stroke [facility]) 1945    age 65 months; residual right sided weakness  . History of blood transfusion 2012  . Charcot foot due to diabetes mellitus (HCC)     chronic pain  . Osteoarthritis     right hip and shoulder  . COPD (chronic obstructive pulmonary disease) ([place])   .  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 Hiatal hernia   . Chronic stasis dermatitis     Chronic stasis changes.  . CKD (chronic kidney disease), stage III   . Syncope     a. 07/2013: suspected due to hypotension.  . Biventricular cardiac pacemaker in situ     a. [address] [employer] RF model [id number] (serial number [id number])  . Complete heart block (HCC)     a. s/p pacemaker placement on 08/2013 admission  . Duodenal ulcer     Past Surgical History  Procedure Laterality Date  . Carpal tunnel release Left   . Sigmoidoscopy  03/11/2001  . Venous doppler  01/30/2004  . Tonsillectomy and adenoidectomy      "as a kid"  . Cataract extraction w/ intraocular lens  implant, bilateral    . Coronary angioplasty with stent placement  08/2011    "1"  . Retinopathy surgery Bilateral 2000's    "laser; both eyes"  . Wrist fusion Right 1976  . Anterior fusion cervical spine  2010    "C spine; Dr. Ronnald Ramp"  . Ankle fracture surgery Left 1976; ?date    LEFT:  fused; removed bulk of hardware  . Heel spur surgery Left ~ 2007    ? left  . Irrigation and debridement knee  02/27/2012    Procedure: IRRIGATION AND DEBRIDEMENT KNEE;  Surgeon: Augustin Schooling, MD;  Location: [facility]  OR;  Service: Orthopedics;  Laterality: Right;  irrigation and drainage right knee septic bursitis  . Eye surgery  2008    cataract removal, cautery  . Coronary artery bypass graft  1998    CABG X5  . Fracture surgery      foot broken 1976  . Pacemaker insertion  08/08/2013    Biventricular pacemaker: [place] RF model [id number] (serial number [id number])  . Left heart catheterization with coronary/graft angiogram N/A 09/07/2011    Procedure: LEFT HEART CATHETERIZATION WITH [name];  Surgeon: Jettie Booze, MD;  Location: [facility] CATH LAB;  Service: Cardiovascular;  Laterality: N/A;  . Temporary pacemaker insertion  08/07/2013    Procedure: TEMPORARY PACEMAKER  INSERTION;  Surgeon: Leonie Man, MD;  Location: [facility] CATH LAB;  Service: Cardiovascular;;  . Left heart catheterization with coronary/graft angiogram  08/07/2013    Procedure: LEFT HEART CATHETERIZATION WITH [name];  Surgeon: Leonie Man, MD;  Location: [facility] CATH LAB;  Service: Cardiovascular;;  . Bi-ventricular pacemaker insertion N/A 08/08/2013    Procedure: BI-VENTRICULAR PACEMAKER INSERTION (CRT-P);  Surgeon: Coralyn Mark, MD;  Location: [facility] CATH LAB;  Service: Cardiovascular;  Laterality: N/A;  . Left heart catheterization with coronary/graft angiogram N/A 10/27/2013    Procedure: LEFT HEART CATHETERIZATION WITH [name];  Surgeon: Sinclair Grooms, MD;  Location: [facility] CATH LAB;  Service: Cardiovascular;  Laterality: N/A;    Social History:  reports that he has never smoked. He has never used smokeless tobacco. He reports that he does not drink alcohol or use illicit drugs.   Family History  Problem Relation Age of Onset  . CAD Mother     Coronary Artery Disease  . CAD Father     Coronary Artery Disease  . CAD Paternal Grandmother     Coronary Artery Disease  . Colon cancer Neg Hx   . Diabetes Maternal Grandmother   . Cancer Maternal Grandmother     male cancer ? type  . Diabetes Brother     Allergies  Allergen Reactions  . Sildenafil Nausea Only and Other (See Comments)    "took it once; heart went to pieces; never took it again"  . Percocet [Oxycodone-Acetaminophen] Other (See Comments)    Unknown reaction-possible altered mental state-per wife.    . Contrast Media [Iodinated Diagnostic Agents] Nausea Only     Medications: I have reviewed patients current medications as documented in Epic Anti-infectives    Start     Dose/Rate Route Frequency Ordered Stop   05/29/15 1200  ceFAZolin (ANCEF) IVPB 2 g/50 mL premix  Status:  Discontinued     2 g 100 mL/hr over 30 Minutes Intravenous 3 times per day 05/29/15 1048 05/29/15 1052    05/29/15 1100  cefTRIAXone (ROCEPHIN) 2 g in dextrose 5 % 50 mL IVPB     2 g 100 mL/hr over 30 Minutes Intravenous Every 24 hours 05/29/15 1052     05/27/15 2200  vancomycin (VANCOCIN) IVPB 750 mg/150 ml premix  Status:  Discontinued     750 mg 150 mL/hr over 60 Minutes Intravenous Every 12 hours 05/27/15 1426 05/29/15 1022   05/27/15 1600  piperacillin-tazobactam (ZOSYN) IVPB 3.375 g  Status:  Discontinued     3.375 g 12.5 mL/hr over 240 Minutes Intravenous Every 8 hours 05/27/15 1427 05/29/15 1022   05/27/15 0930  piperacillin-tazobactam (ZOSYN) IVPB 3.375 g     3.375 g 100 mL/hr over 30 Minutes Intravenous  Once 05/27/15  4944 05/27/15 1022   05/27/15 0930  vancomycin (VANCOCIN) IVPB 1000 mg/200 mL premix     1,000 mg 200 mL/hr over 60 Minutes Intravenous  Once 05/27/15 0923 05/27/15 1052         ROS: As per history of present illness the patient himself cannot provide a review of systems now though however.   Blood pressure 131/67, pulse 82, temperature 98.9 F (37.2 C), temperature source Oral, resp. rate 26, height 5' 1"  (1.549 m), weight 183 lb 3.2 oz (83.1 kg), SpO2 98 %. General: Fairly somnolent but arousable and oriented to person HEENT: anicteric sclera,  EOMI, oropharynx clear and without exudate Cardiovascular: regular rate, normal r,  no murmur rubs or gallops Pulmonary: Rhonchorous airway sounds throughout Gastrointestinal: soft distended and mildly diffusely tender, Musculoskeletal:Skin, soft tissue: no rashes  96/75/91: ? Embolic lesions on toes     Neuro: nonfocal, strength and sensation intact   Results for orders placed or performed during the hospital encounter of 05/27/15 (from the past 48 hour(s))  MRSA PCR Screening     Status: None   Collection Time: 05/27/15  4:25 PM  Result Value Ref Range   MRSA by PCR NEGATIVE NEGATIVE    Comment:        The GeneXpert MRSA Assay (FDA approved for NASAL specimens only), is one component of a comprehensive  MRSA colonization surveillance program. It is not intended to diagnose MRSA infection nor to guide or monitor treatment for MRSA infections.   Hemoglobin A1c     Status: Abnormal   Collection Time: 05/27/15  4:50 PM  Result Value Ref Range   Hgb A1c MFr Bld 6.6 (H) 4.8 - 5.6 %    Comment: (NOTE)         Pre-diabetes: 5.7 - 6.4         Diabetes: >6.4         Glycemic control for adults with diabetes: <7.0    Mean Plasma Glucose 143 mg/dL    Comment: (NOTE) Performed At: [facility] [address] [place], [place] [id number] Lindon Romp MD JT:[id number]   Magnesium     Status: None   Collection Time: 05/27/15  4:50 PM  Result Value Ref Range   Magnesium 2.1 1.7 - 2.4 mg/dL  Phosphorus     Status: None   Collection Time: 05/27/15  4:50 PM  Result Value Ref Range   Phosphorus 3.4 2.5 - 4.6 mg/dL  TSH     Status: None   Collection Time: 05/27/15  4:50 PM  Result Value Ref Range   TSH 0.428 0.350 - 4.500 uIU/mL  Lactic acid, plasma     Status: Abnormal   Collection Time: 05/27/15  4:50 PM  Result Value Ref Range   Lactic Acid, Venous 2.7 (HH) 0.5 - 2.0 mmol/L    Comment: CRITICAL RESULT CALLED TO, READ BACK BY AND VERIFIED WITH: C JOHNSON,RN 1808 05/27/2015 WBOND   Glucose, capillary     Status: Abnormal   Collection Time: 05/27/15  5:16 PM  Result Value Ref Range   Glucose-Capillary 203 (H) 65 - 99 mg/dL   Comment 1 Notify RN    Comment 2 Document in Chart   Lactic acid, plasma     Status: Abnormal   Collection Time: 05/27/15  6:58 PM  Result Value Ref Range   Lactic Acid, Venous 2.4 (HH) 0.5 - 2.0 mmol/L    Comment: CRITICAL RESULT CALLED TO, READ BACK BY AND VERIFIED WITH: Darla Lesches RN  [id number] 2005 GREEN R   Procalcitonin - Baseline     Status: None   Collection Time: 05/27/15  6:58 PM  Result Value Ref Range   Procalcitonin 1.01 ng/mL    Comment:        Interpretation: PCT > 0.5 ng/mL and <= 2 ng/mL: Systemic infection (sepsis) is possible, but  other conditions are known to elevate PCT as well. (NOTE)         ICU PCT Algorithm               Non ICU PCT Algorithm    ----------------------------     ------------------------------         PCT < 0.25 ng/mL                 PCT < 0.1 ng/mL     Stopping of antibiotics            Stopping of antibiotics       strongly encouraged.               strongly encouraged.    ----------------------------     ------------------------------       PCT level decrease by               PCT < 0.25 ng/mL       >= 80% from peak PCT       OR PCT 0.25 - 0.5 ng/mL          Stopping of antibiotics                                             encouraged.     Stopping of antibiotics           encouraged.    ----------------------------     ------------------------------       PCT level decrease by              PCT >= 0.25 ng/mL       < 80% from peak PCT        AND PCT >= 0.5 ng/mL             Continuing antibiotics                                              encouraged.       Continuing antibiotics            encouraged.    ----------------------------     ------------------------------     PCT level increase compared          PCT > 0.5 ng/mL         with peak PCT AND          PCT >= 0.5 ng/mL             Escalation of antibiotics                                          strongly encouraged.      Escalation of antibiotics        strongly encouraged.   Glucose, capillary     Status: Abnormal  Collection Time: 05/27/15  8:00 PM  Result Value Ref Range   Glucose-Capillary 178 (H) 65 - 99 mg/dL  Glucose, capillary     Status: Abnormal   Collection Time: 05/28/15  1:17 AM  Result Value Ref Range   Glucose-Capillary 190 (H) 65 - 99 mg/dL  Glucose, capillary     Status: Abnormal   Collection Time: 05/28/15  3:56 AM  Result Value Ref Range   Glucose-Capillary 170 (H) 65 - 99 mg/dL   Comment 1 Notify RN   Comprehensive metabolic panel     Status: Abnormal   Collection Time: 05/28/15  4:10 AM  Result  Value Ref Range   Sodium 143 135 - 145 mmol/L   Potassium 4.0 3.5 - 5.1 mmol/L   Chloride 105 101 - 111 mmol/L   CO2 24 22 - 32 mmol/L   Glucose, Bld 153 (H) 65 - 99 mg/dL   BUN 23 (H) 6 - 20 mg/dL   Creatinine, Ser 1.30 (H) 0.61 - 1.24 mg/dL   Calcium 8.4 (L) 8.9 - 10.3 mg/dL   Total Protein 6.1 (L) 6.5 - 8.1 g/dL   Albumin 2.9 (L) 3.5 - 5.0 g/dL   AST 62 (H) 15 - 41 U/L   ALT 49 17 - 63 U/L   Alkaline Phosphatase 55 38 - 126 U/L   Total Bilirubin 0.7 0.3 - 1.2 mg/dL   GFR calc non Af Amer 54 (L) >60 mL/min   GFR calc Af Amer >60 >60 mL/min    Comment: (NOTE) The eGFR has been calculated using the CKD EPI equation. This calculation has not been validated in all clinical situations. eGFR's persistently <60 mL/min signify possible Chronic Kidney Disease.    Anion gap 14 5 - 15  CBC     Status: Abnormal   Collection Time: 05/28/15  4:10 AM  Result Value Ref Range   WBC 8.3 4.0 - 10.5 K/uL   RBC 5.12 4.22 - 5.81 MIL/uL   Hemoglobin 15.6 13.0 - 17.0 g/dL   HCT 47.6 39.0 - 52.0 %   MCV 93.0 78.0 - 100.0 fL   MCH 30.5 26.0 - 34.0 pg   MCHC 32.8 30.0 - 36.0 g/dL   RDW 14.1 11.5 - 15.5 %   Platelets 90 (L) 150 - 400 K/uL    Comment: PLATELET COUNT CONFIRMED BY SMEAR  Glucose, capillary     Status: Abnormal   Collection Time: 05/28/15  7:40 AM  Result Value Ref Range   Glucose-Capillary 132 (H) 65 - 99 mg/dL   Comment 1 Notify RN    Comment 2 Document in Chart   Glucose, capillary     Status: Abnormal   Collection Time: 05/28/15 12:03 PM  Result Value Ref Range   Glucose-Capillary 157 (H) 65 - 99 mg/dL   Comment 1 Notify RN    Comment 2 Document in Chart   Lactic acid, plasma     Status: Abnormal   Collection Time: 05/28/15 12:05 PM  Result Value Ref Range   Lactic Acid, Venous 2.3 (HH) 0.5 - 2.0 mmol/L    Comment: CRITICAL RESULT CALLED TO, READ BACK BY AND VERIFIED WITH: D.DILLON,RN 05/28/15 1302 BY BSLADE   Lactic acid, plasma     Status: None   Collection Time:  05/28/15  3:15 PM  Result Value Ref Range   Lactic Acid, Venous 1.2 0.5 - 2.0 mmol/L  Glucose, capillary     Status: Abnormal   Collection Time: 05/28/15  4:48 PM  Result  Value Ref Range   Glucose-Capillary 155 (H) 65 - 99 mg/dL   Comment 1 Document in Chart   Troponin I (q 6hr x 3)     Status: Abnormal   Collection Time: 05/28/15  7:53 PM  Result Value Ref Range   Troponin I 0.29 (H) <0.031 ng/mL    Comment:        PERSISTENTLY INCREASED TROPONIN VALUES IN THE RANGE OF 0.04-0.49 ng/mL CAN BE SEEN IN:       -UNSTABLE ANGINA       -CONGESTIVE HEART FAILURE       -MYOCARDITIS       -CHEST TRAUMA       -ARRYHTHMIAS       -LATE PRESENTING MYOCARDIAL INFARCTION       -COPD   CLINICAL FOLLOW-UP RECOMMENDED.   Glucose, capillary     Status: Abnormal   Collection Time: 05/28/15  8:26 PM  Result Value Ref Range   Glucose-Capillary 170 (H) 65 - 99 mg/dL   Comment 1 Notify RN   Glucose, capillary     Status: Abnormal   Collection Time: 05/29/15 12:30 AM  Result Value Ref Range   Glucose-Capillary 173 (H) 65 - 99 mg/dL   Comment 1 Notify RN   CBC with Differential/Platelet     Status: Abnormal   Collection Time: 05/29/15 12:47 AM  Result Value Ref Range   WBC 7.1 4.0 - 10.5 K/uL   RBC 5.79 4.22 - 5.81 MIL/uL   Hemoglobin 17.9 (H) 13.0 - 17.0 g/dL   HCT 53.3 (H) 39.0 - 52.0 %   MCV 92.1 78.0 - 100.0 fL   MCH 30.9 26.0 - 34.0 pg   MCHC 33.6 30.0 - 36.0 g/dL   RDW 14.2 11.5 - 15.5 %   Platelets 80 (L) 150 - 400 K/uL    Comment: SPECIMEN CHECKED FOR CLOTS REPEATED TO VERIFY CONSISTENT WITH PREVIOUS RESULT    Neutrophils Relative % 81 %   Neutro Abs 5.8 1.7 - 7.7 K/uL   Lymphocytes Relative 8 %   Lymphs Abs 0.6 (L) 0.7 - 4.0 K/uL   Monocytes Relative 11 %   Monocytes Absolute 0.8 0.1 - 1.0 K/uL   Eosinophils Relative 0 %   Eosinophils Absolute 0.0 0.0 - 0.7 K/uL   Basophils Relative 0 %   Basophils Absolute 0.0 0.0 - 0.1 K/uL  Glucose, capillary     Status: Abnormal    Collection Time: 05/29/15  4:29 AM  Result Value Ref Range   Glucose-Capillary 173 (H) 65 - 99 mg/dL   Comment 1 Notify RN   Troponin I (q 6hr x 3)     Status: Abnormal   Collection Time: 05/29/15  6:53 AM  Result Value Ref Range   Troponin I 1.13 (HH) <0.031 ng/mL    Comment:        POSSIBLE MYOCARDIAL ISCHEMIA. SERIAL TESTING RECOMMENDED. CRITICAL RESULT CALLED TO, READ BACK BY AND VERIFIED WITH: GROGAN,B RN 05/29/2015 0740 JORDANS   Comprehensive metabolic panel     Status: Abnormal   Collection Time: 05/29/15  6:53 AM  Result Value Ref Range   Sodium 149 (H) 135 - 145 mmol/L   Potassium 2.8 (L) 3.5 - 5.1 mmol/L    Comment: DELTA CHECK NOTED   Chloride 103 101 - 111 mmol/L   CO2 29 22 - 32 mmol/L   Glucose, Bld 200 (H) 65 - 99 mg/dL   BUN 20 6 - 20 mg/dL   Creatinine, Ser 1.48 (H)  0.61 - 1.24 mg/dL   Calcium 8.6 (L) 8.9 - 10.3 mg/dL   Total Protein 6.5 6.5 - 8.1 g/dL   Albumin 2.6 (L) 3.5 - 5.0 g/dL   AST 70 (H) 15 - 41 U/L   ALT 59 17 - 63 U/L   Alkaline Phosphatase 52 38 - 126 U/L   Total Bilirubin 1.0 0.3 - 1.2 mg/dL   GFR calc non Af Amer 46 (L) >60 mL/min   GFR calc Af Amer 53 (L) >60 mL/min    Comment: (NOTE) The eGFR has been calculated using the CKD EPI equation. This calculation has not been validated in all clinical situations. eGFR's persistently <60 mL/min signify possible Chronic Kidney Disease.    Anion gap 17 (H) 5 - 15  Magnesium     Status: None   Collection Time: 05/29/15  6:53 AM  Result Value Ref Range   Magnesium 2.3 1.7 - 2.4 mg/dL  Glucose, capillary     Status: Abnormal   Collection Time: 05/29/15  8:18 AM  Result Value Ref Range   Glucose-Capillary 208 (H) 65 - 99 mg/dL   Comment 1 Notify RN   Amylase     Status: None   Collection Time: 05/29/15  8:55 AM  Result Value Ref Range   Amylase 53 28 - 100 U/L  Lipase, blood     Status: None   Collection Time: 05/29/15  8:55 AM  Result Value Ref Range   Lipase 37 11 - 51 U/L    Comment:  Please note change in reference range.  Blood gas, arterial     Status: Abnormal   Collection Time: 05/29/15  8:57 AM  Result Value Ref Range   FIO2 1.00    Delivery systems NON-REBREATHER OXYGEN MASK    pH, Arterial 7.467 (H) 7.350 - 7.450   pCO2 arterial 41.3 35.0 - 45.0 mmHg   pO2, Arterial 134 (H) 80.0 - 100.0 mmHg   Bicarbonate 29.6 (H) 20.0 - 24.0 mEq/L   TCO2 30.8 0 - 100 mmol/L   Acid-Base Excess 5.7 (H) 0.0 - 2.0 mmol/L   O2 Saturation 96.9 %   Patient temperature 98.1    Collection site LEFT RADIAL    Drawn by [id number]    Sample type ARTERIAL DRAW    Allens test (pass/fail) PASS PASS  Glucose, capillary     Status: Abnormal   Collection Time: 05/29/15 11:44 AM  Result Value Ref Range   Glucose-Capillary 160 (H) 65 - 99 mg/dL   Comment 1 Notify RN    Comment 2 Document in Chart   Potassium     Status: None   Collection Time: 05/29/15 11:45 AM  Result Value Ref Range   Potassium 4.0 3.5 - 5.1 mmol/L    Comment: DELTA CHECK NOTED MARKED HEMOLYSIS HEMOLYSIS AT THIS LEVEL MAY AFFECT RESULT   Magnesium     Status: Abnormal   Collection Time: 05/29/15 11:45 AM  Result Value Ref Range   Magnesium 2.5 (H) 1.7 - 2.4 mg/dL   @BRIEFLABTABLE (sdes,specrequest,cult,reptstatus)   ) Recent Results (from the past 720 hour(s))  Blood Culture (routine x 2)     Status: None   Collection Time: 05/27/15  9:20 AM  Result Value Ref Range Status   Specimen Description BLOOD LEFT ANTECUBITAL  Final   Special Requests BOTTLES DRAWN AEROBIC AND ANAEROBIC 5ML  Final   Culture  Setup Time   Final    GRAM POSITIVE COCCI IN CLUSTERS IN BOTH AEROBIC AND ANAEROBIC BOTTLES  CRITICAL RESULT CALLED TO, READ BACK BY AND VERIFIED WITH: R TARWATER RN 2250 05/27/15 A BROWNING    Culture STAPHYLOCOCCUS AUREUS  Final   Report Status 05/29/2015 FINAL  Final   Organism ID, Bacteria STAPHYLOCOCCUS AUREUS  Final      Susceptibility   Staphylococcus aureus - MIC*    CIPROFLOXACIN <=0.5 SENSITIVE  Sensitive     ERYTHROMYCIN 0.5 SENSITIVE Sensitive     GENTAMICIN <=0.5 SENSITIVE Sensitive     OXACILLIN <=0.25 SENSITIVE Sensitive     TETRACYCLINE <=1 SENSITIVE Sensitive     VANCOMYCIN 1 SENSITIVE Sensitive     TRIMETH/SULFA <=10 SENSITIVE Sensitive     CLINDAMYCIN <=0.25 SENSITIVE Sensitive     RIFAMPIN <=0.5 SENSITIVE Sensitive     Inducible Clindamycin NEGATIVE Sensitive     * STAPHYLOCOCCUS AUREUS  Blood Culture (routine x 2)     Status: None   Collection Time: 05/27/15  9:39 AM  Result Value Ref Range Status   Specimen Description BLOOD RIGHT ANTECUBITAL  Final   Special Requests BOTTLES DRAWN AEROBIC AND ANAEROBIC 5ML  Final   Culture  Setup Time   Final    GRAM POSITIVE COCCI IN CLUSTERS IN BOTH AEROBIC AND ANAEROBIC BOTTLES CRITICAL RESULT CALLED TO, READ BACK BY AND VERIFIED WITH: R TARWATER RN 2250 05/27/15 A BROWNING    Culture   Final    STAPHYLOCOCCUS AUREUS SUSCEPTIBILITIES PERFORMED ON PREVIOUS CULTURE WITHIN THE LAST 5 DAYS.    Report Status 05/29/2015 FINAL  Final  Urine culture     Status: None   Collection Time: 05/27/15  9:52 AM  Result Value Ref Range Status   Specimen Description URINE, CLEAN CATCH  Final   Special Requests NONE  Final   Culture NO GROWTH 1 DAY  Final   Report Status 05/28/2015 FINAL  Final  Respiratory virus panel     Status: None   Collection Time: 05/27/15 12:43 PM  Result Value Ref Range Status   Source - RVPAN NASAL SWAB  Corrected   Respiratory Syncytial Virus A Negative Negative Final   Respiratory Syncytial Virus B Negative Negative Final   Influenza A Negative Negative Final   Influenza B Negative Negative Final   Parainfluenza 1 Negative Negative Final   Parainfluenza 2 Negative Negative Final   Parainfluenza 3 Negative Negative Final   Metapneumovirus Negative Negative Final   Rhinovirus Negative Negative Final   Adenovirus Negative Negative Final    Comment: (NOTE) Performed At: [facility] [address] [place], [place] [id number] Lindon Romp MD GQ:[id number]   MRSA PCR Screening     Status: None   Collection Time: 05/27/15  4:25 PM  Result Value Ref Range Status   MRSA by PCR NEGATIVE NEGATIVE Final    Comment:        The GeneXpert MRSA Assay (FDA approved for NASAL specimens only), is one component of a comprehensive MRSA colonization surveillance program. It is not intended to diagnose MRSA infection nor to guide or monitor treatment for MRSA infections.      Impression/Recommendation  Principal Problem:   FUO (fever of unknown origin) Active Problems:   HYPERCHOLESTEROLEMIA   Iron deficiency anemia   Peripheral neuropathy (HCC)   BLINDNESS   HTN (hypertension)   Coronary atherosclerosis of autologous vein bypass graft   Chronic systolic heart failure (HCC)   Chronic kidney disease, stage III (moderate)   Pacemaker   Chronic pain syndrome   Diabetes mellitus type II, uncontrolled ([place])  AKI (acute kidney injury) ([place])   Thrombocytopenia ([place])   Metabolic encephalopathy   Pulmonary HTN ([place])   Palliative care encounter   Aspiration pneumonia ([place])   Controlled diabetes mellitus type 2 with complications ([place])   HLD (hyperlipidemia)   Cardiac pacemaker in situ   Peripheral polyneuropathy (HCC)   Pressure ulcer   Mark French is a 69 y.o. male with  staph coccus aureus bacteremia confusion and pacemaker infection  #1 Staphylococcus aureus bacteremia with pacemaker infection:  See SAB form below but largest issues are:  Pacemaker IS by definition infected  It must be removed to effect cure. It sounds like the patient will require a temporary pacemaker to be placed given his history of competing pleat heart block.  This will need to be followed by 6 weeks of IV antibiotics, prior to reimplantation of a new pacemaker  He will also need his heart valves assessed for endocarditis.  If we do not pursue pacemaker extraction he will fail  antimicrobial therapy.  If this is the case I would strongly consider a palliative care consult as soon as possible       [place] Antimicrobial Management Team Staphylococcus aureus bacteremia   Staphylococcus aureus bacteremia (SAB) is associated with a high rate of complications and mortality.  Specific aspects of clinical management are critical to optimizing the outcome of patients with SAB.  Therefore, the [facility] Antimicrobial Management Team [facility]) has initiated an intervention aimed at improving the management of SAB at [facility].  To do so, Infectious Diseases physicians are providing an evidence-based consult for the management of all patients with SAB.     Yes No Comments  Perform follow-up blood cultures (even if the patient is afebrile) to ensure clearance of bacteremia [x]  []   ordered   Remove vascular catheter and obtain follow-up blood cultures after the removal of the catheter []  []  No catheters in but would want people cultures after pacemaker extraction   Perform echocardiography to evaluate for endocarditis (transthoracic ECHO is 40-50% sensitive, TEE is > 90% sensitive) [x]  []  Please keep in mind, that neither test can definitively EXCLUDE endocarditis, and that should clinical suspicion remain high for endocarditis the patient should then still be treated with an "endocarditis" duration of therapy = 6 weeks  Would need TEE if we are going to be aggressive.  Consult electrophysiologist to evaluate implanted cardiac device (pacemaker, ICD) [x]  []  Dr. Lovena Le will need to be contacted if PM extraction is feasable with temporary PM  Ensure source control []  []  Have all abscesses been drained effectively? Have deep seeded infections (septic joints or osteomyelitis) had appropriate surgical debridement?  As mentioned many times above pacemaker needs to be extracted to effect source/ /metastatic focus control   Investigate for "metastatic" sites of infection []  []  Does  the patient have ANY symptom or physical exam finding that would suggest a deeper infection (back or neck pain that may be suggestive of vertebral osteomyelitis or epidural abscess, muscle pain that could be a symptom of pyomyositis)?  Keep in mind that for deep seeded infections MRI imaging with contrast is preferred rather than other often insensitive tests such as plain x-rays, especially early in a patient's presentation. not clear based on patient's exam and not able to ask him questions that he can answer   Change antibiotic therapy to IV ceftriaxone  to make sure that we have a drug that will achieve CSF concentration given concern for possibility that he could have  an infected heart valve with potential septic emboli to the brain  []  []  Beta-lactam antibiotics are preferred for MSSA due to higher cure rates.   If on Vancomycin, goal trough should be 15 - 20 mcg/mL  Estimated duration of IV antibiotic therapy:  6 weeks after PM extraction []  []  Consult case management for probably prolonged outpatient IV antibiotic therapy      05/29/2015, 1:28 PM   Thank you so much for this interesting consult  [place] [facility] [place] [phone] (pager) [phone] (office) 05/29/2015, 1:28 PM  Rhina Brack[place] 05/29/2015, 1:28 PM

## 2015-05-29 NOTE — Consult Note (Signed)
Consultation  Referring Provider: Dr Sherral Hammers [facility] Primary Care Physician:  [name], MD Primary Gastroenterologist:  Dr.Brodie- former  Reason for Consultation:   Ileus  HPI: Mark French is a 69 y.o. male admitted 05/27/2015 with FUO, and since determined to be  septic with pneumonia and positive blood cultures for Staff aureus . Patient has multiple medical problems and has been chronically ill. He has congestive heart failure, history of complete heart block status post pacer placement, peripheral vascular disease, insulin-dependent diabetes mellitus, coronary artery disease status post MI in 2013 and CABG. Also with chronic kidney disease stage III, chronic pain syndrome, prior history of GI bleed with duodenal ulcer 2012. We are asked to see currently for help with management of a diffuse ileus. Patient has been noted to have increased abdominal distention since admission. Abdominal films obtained last evening showed a diffuse ileus, no obstruction and noncritical cecal measurements. Chest x-ray today shows bilateral pulmonary edema and worsening low volume in both lungs and bilateral pleural effusions. NG tube was placed last evening and rectal tube placed this morning. Patient. Only had been having some diarrhea but this has ceased.   Labs reviewed, troponin on the rise, potassium 2.8 this a.m.   Past Medical History  Diagnosis Date  . CORONARY ARTERY DISEASE     a. s/p CABG 1998. b.  08/2011: inferior STEMI.  LHC 09/07/11: Severe 3v CAD, LIMA-LAD patent, SVG-RCA chronically occluded, SVG-circumflex chronically occluded, SVG-diagonal 95%. PCI: Promus DES to the SVG-diagonal.  Procedure c/b transient CHB req CPR and TTVP. c. Myoview 11/2011: scar but no ischemia.   [name] DIABETES MELLITUS, TYPE I   . HYPERTENSION   . 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 HYPERCHOLESTEROLEMIA   . CERVICAL RADICULOPATHY, LEFT   . PERIPHERAL NEUROPATHY   . Chronic systolic heart  failure (HCC)     Echocardiogram 08/09/13:Cavity size normal, EF 40%, inferior hypokinesis, grade 1 diastolic dysfunction, paradoxical septal motion, mild MR, mild LAE, no PFO, PAS 51, moderate pulm HTN  . ED (erectile dysfunction)   . Hypogonadism male   . GI bleed 2012    Multiple Duodenal ulcer   . Obesity   . Legally blind     bilaterally  . Stroke [facility]) 1945    age 53 months; residual right sided weakness  . History of blood transfusion 2012  . Charcot foot due to diabetes mellitus (HCC)     chronic pain  . Osteoarthritis     right hip and shoulder  . COPD (chronic obstructive pulmonary disease) ([place])   . 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 Hiatal hernia   . Chronic stasis dermatitis     Chronic stasis changes.  . CKD (chronic kidney disease), stage III   . Syncope     a. 07/2013: suspected due to hypotension.  . Biventricular cardiac pacemaker in situ     a. [address] [employer] [place] RF model [id number] (serial number [id number])  . Complete heart block (HCC)     a. s/p pacemaker placement on 08/2013 admission  . Duodenal ulcer     Past Surgical History  Procedure Laterality Date  . Carpal tunnel release Left   . Sigmoidoscopy  03/11/2001  . Venous doppler  01/30/2004  . Tonsillectomy and adenoidectomy      "as a kid"  . Cataract extraction w/ intraocular lens  implant, bilateral    . Coronary angioplasty with stent placement  08/2011    "1"  .  Retinopathy surgery Bilateral 2000's    "laser; both eyes"  . Wrist fusion Right 1976  . Anterior fusion cervical spine  2010    "C spine; Dr. Ronnald Ramp"  . Ankle fracture surgery Left 1976; ?date    LEFT:  fused; removed bulk of hardware  . Heel spur surgery Left ~ 2007    ? left  . Irrigation and debridement knee  02/27/2012    Procedure: IRRIGATION AND DEBRIDEMENT KNEE;  Surgeon: Augustin Schooling, MD;  Location: [place];  Service: Orthopedics;  Laterality: Right;  irrigation and drainage right knee  septic bursitis  . Eye surgery  2008    cataract removal, cautery  . Coronary artery bypass graft  1998    CABG X5  . Fracture surgery      foot broken 1976  . Pacemaker insertion  08/08/2013    Biventricular pacemaker: [place] RF model [id number] (serial number [id number])  . Left heart catheterization with coronary/graft angiogram N/A 09/07/2011    Procedure: LEFT HEART CATHETERIZATION WITH [name];  Surgeon: Jettie Booze, MD;  Location: [facility] CATH LAB;  Service: Cardiovascular;  Laterality: N/A;  . Temporary pacemaker insertion  08/07/2013    Procedure: TEMPORARY PACEMAKER INSERTION;  Surgeon: Leonie Man, MD;  Location: [facility] CATH LAB;  Service: Cardiovascular;;  . Left heart catheterization with coronary/graft angiogram  08/07/2013    Procedure: LEFT HEART CATHETERIZATION WITH [name];  Surgeon: Leonie Man, MD;  Location: [facility] CATH LAB;  Service: Cardiovascular;;  . Bi-ventricular pacemaker insertion N/A 08/08/2013    Procedure: BI-VENTRICULAR PACEMAKER INSERTION (CRT-P);  Surgeon: Coralyn Mark, MD;  Location: [facility] CATH LAB;  Service: Cardiovascular;  Laterality: N/A;  . Left heart catheterization with coronary/graft angiogram N/A 10/27/2013    Procedure: LEFT HEART CATHETERIZATION WITH [name];  Surgeon: Sinclair Grooms, MD;  Location: [facility] CATH LAB;  Service: Cardiovascular;  Laterality: N/A;    Prior to Admission medications   Medication Sig Start Date End Date Taking? Authorizing Provider  AMBULATORY NON FORMULARY MEDICATION Pt on O2 - 2 liter   Yes Historical Provider, MD  aspirin EC 81 MG tablet Take 81 mg by mouth at bedtime.   Yes Historical Provider, MD  atorvastatin (LIPITOR) 40 MG tablet TAKE 1 TABLET BY MOUTH EVERY DAY 04/21/15  Yes Sherren Mocha, MD  Blood Glucose Monitoring Suppl (BAYER BREEZE 2 SYSTEM) W/DEVICE KIT 1 Device by Does not apply route once. 11/20/14  Yes [name], MD  calcitRIOL  (ROCALTROL) 0.25 MCG capsule TAKE 1 CAPSULE BY MOUTH EVERY DAY 05/24/15  Yes [name], MD  carvedilol (COREG) 3.125 MG tablet TAKE 1 TABLET BY MOUTH TWICE DAILY WITH A MEAL 12/15/14  Yes Thompson Grayer, MD  clopidogrel (PLAVIX) 75 MG tablet TAKE 1 TABLET BY MOUTH EVERY DAY 02/24/15  Yes Sherren Mocha, MD  co-enzyme Q-10 30 MG capsule Take 30 mg by mouth daily. @@ 2pm   Yes Historical Provider, MD  docusate sodium (COLACE) 100 MG capsule Take 300 mg by mouth daily.    Yes Historical Provider, MD  famotidine (PEPCID) 20 MG tablet Take 20 mg by mouth before cath procedure. stomach   Yes Historical Provider, MD  famotidine (PEPCID) 20 MG tablet Take 20 mg by mouth daily as needed. indigestion 04/29/15  Yes Historical Provider, MD  furosemide (LASIX) 40 MG tablet TAKE 2 TABLETS BY MOUTH TWICE DAILY 02/24/15  Yes Sherren Mocha, MD  gabapentin (NEURONTIN) 400 MG capsule Take 2 capsules (  800 mg total) by mouth 3 (three) times daily. 04/16/15  Yes [name], MD  HYDROcodone-acetaminophen (NORCO) 10-325 MG tablet Take 1-2 tablets by mouth every 4 (four) hours as needed for moderate pain. 05/14/15  Yes [name], MD  hyoscyamine (LEVSIN SL) 0.125 MG SL tablet DISSOLVE 1 TABLET UNDER THE TONGUE EVERY 4 HOURS AS NEEDED 03/25/15  Yes Lafayette Dragon, MD  isosorbide mononitrate (IMDUR) 30 MG 24 hr tablet TAKE 1 TABLET BY MOUTH EVERY DAY 01/06/15  Yes Sherren Mocha, MD  LORazepam (ATIVAN) 0.5 MG tablet Take 1 tablet (0.5 mg total) by mouth every 6 (six) hours as needed for anxiety (anxiety). 04/21/15  Yes [name], MD  losartan (COZAAR) 50 MG tablet TAKE 1/2 TABLET BY MOUTH EVERY DAY 09/10/14  Yes Sherren Mocha, MD  methadone (DOLOPHINE) 5 MG tablet Take 1 tablet (5 mg total) by mouth 2 (two) times daily. 05/14/15  Yes [name], MD  Morphine Sulfate (MORPHINE CONCENTRATE) 10 mg / 0.5 ml concentrated solution Place 0.25-0.5 mLs (5-10 mg total) under the tongue every 2 (two) hours as needed for moderate pain,  severe pain, anxiety or shortness of breath. 10/30/13  Yes Debbe Odea, MD  nitroGLYCERIN (NITROSTAT) 0.4 MG SL tablet Place 1 tablet (0.4 mg total) under the tongue every 5 (five) minutes as needed for chest pain. 10/29/12  Yes Sherren Mocha, MD  OVER THE COUNTER MEDICATION Place 1 drop into both eyes daily as needed (dry eyes). [facility] eye drops   Yes Historical Provider, MD  pantoprazole (PROTONIX) 40 MG tablet Take 1 tablet (40 mg total) by mouth 2 (two) times daily. 05/14/15  Yes Manus Gunning, MD  polyethylene glycol powder [facility]) powder Take 1 Container by mouth daily.  08/18/14  Yes Historical Provider, MD  polyethylene glycol powder (GLYCOLAX/MIRALAX) powder DISSOLVE 9 GRAMS( 1/2 CAPFUL) IN AT LEAST 8 OUNCES OF WATER/ JUICE AND DRINK THREE TIMES WEEKLY 03/02/15  Yes Lafayette Dragon, MD  potassium chloride SA (K-DUR,KLOR-CON) 20 MEQ tablet TAKE 1 TABLET BY MOUTH TWICE DAILY 02/05/15  Yes Sherren Mocha, MD  Sennosides-Docusate Sodium (SENNA PLUS PO) Take 1-4 capsules by mouth every morning. 03/25/15  Yes Historical Provider, MD  tamsulosin (FLOMAX) 0.4 MG CAPS capsule TAKE 1 CAPSULE BY MOUTH DAILY 05/17/15  Yes [name], MD  tolnaftate (ANTI-FUNGAL) 1 % powder Apply 1 application topically 3 (three) times a week. As needed for infection   Yes Historical Provider, MD  dicyclomine (BENTYL) 20 MG tablet Take 1 tablet (20 mg total) by mouth 2 (two) times daily. Patient not taking: Reported on 05/27/2015 12/23/14   Lafayette Dragon, MD  hyoscyamine (LEVSIN SL) 0.125 MG SL tablet DISSOLVE 1 TABLET UNDER THE TONGUE EVERY 4 HOURS AS NEEDED Patient not taking: Reported on 05/27/2015 05/04/15   Manus Gunning, MD    Current Facility-Administered Medications  Medication Dose Route Frequency Provider Last Rate Last Dose  . acetaminophen (TYLENOL) tablet 650 mg  650 mg Oral Q6H PRN Samella Parr, NP       Or  . acetaminophen (TYLENOL) suppository 650 mg  650 mg Rectal Q6H PRN  Samella Parr, NP   650 mg at 05/28/15 0159  . cefTRIAXone (ROCEPHIN) 2 g in dextrose 5 % 50 mL IVPB  2 g Intravenous Q24H Truman Hayward, MD      . dextrose 5 % with KCl 20 mEq / L  infusion  20 mEq Intravenous Continuous Allie Bossier, MD 10 mL/hr at  05/29/15 1009 20 mEq at 05/29/15 1009  . enoxaparin (LOVENOX) injection 40 mg  40 mg Subcutaneous Q24H Samella Parr, NP   40 mg at 05/28/15 1848  . insulin aspart (novoLOG) injection 0-9 Units  0-9 Units Subcutaneous 6 times per day Samella Parr, NP   3 Units at 05/29/15 0854  . metoCLOPramide (REGLAN) injection 5 mg  5 mg Intravenous 4 times per day Samella Parr, NP   5 mg at 05/29/15 0612  . morphine 2 MG/ML injection 1-2 mg  1-2 mg Intravenous Q3H PRN Dory Horn, NP   2 mg at 05/29/15 0732  . potassium chloride 10 mEq in 100 mL IVPB  10 mEq Intravenous Q1 Hr x 4 Allie Bossier, MD   10 mEq at 05/29/15 1009  . promethazine (PHENERGAN) injection 6.25 mg  6.25 mg Intravenous Q6H PRN Samella Parr, NP   6.25 mg at 05/27/15 2240    Allergies as of 05/27/2015 - Review Complete 05/27/2015  Allergen Reaction Noted  . Sildenafil Nausea Only and Other (See Comments) 09/23/2008  . Percocet [oxycodone-acetaminophen] Other (See Comments) 10/15/2012  . Contrast media [iodinated diagnostic agents] Nausea Only 08/22/2011    Family History  Problem Relation Age of Onset  . CAD Mother     Coronary Artery Disease  . CAD Father     Coronary Artery Disease  . CAD Paternal Grandmother     Coronary Artery Disease  . Colon cancer Neg Hx   . Diabetes Maternal Grandmother   . Cancer Maternal Grandmother     male cancer ? type  . Diabetes Brother     Social History   Social History  . Marital Status: Married    Spouse Name: N/A  . Number of Children: 0  . Years of Education: N/A   Occupational History  . Disabled    Social History Main Topics  . Smoking status: Never Smoker   . Smokeless tobacco: Never Used  .  Alcohol Use: No  . Drug Use: No  . Sexual Activity: No   Other Topics Concern  . Not on file   Social History Narrative   Comes to appointments with wife      0 Caffeine drinks daily     Review of Systems: Pertinent positive and negative review of systems were noted in the above HPI section.  All other review of systems was otherwise negative. Physical Exam: Vital signs in last 24 hours: Temp:  [97.6 F (36.4 C)-98.4 F (36.9 C)] 98.1 F (36.7 C) (10/22 0800) Pulse Rate:  [78-108] 80 (10/22 0818) Resp:  [30-37] 30 (10/22 0720) BP: (102-185)/(58-101) 154/81 mmHg (10/22 0818) SpO2:  [92 %-100 %] 99 % (10/22 0818) Weight:  [183 lb 3.2 oz (83.1 kg)-190 lb 14.7 oz (86.6 kg)] 183 lb 3.2 oz (83.1 kg) (10/22 0443) Last BM Date: 05/29/15 General:   Somnolent, arouses briefly  Well-developed, chronically ill appearing pleasant and cooperative in NAD Head:  Normocephalic and atraumatic. Eyes:  Sclera clear, no icterus.   Conjunctiva pink. Ears:  Normal auditory acuity. Nose:  No deformity, discharge,  or lesions. Mouth:  No deformity or lesions.   Neck:  Supple; no masses or thyromegaly. Lungs:  Decreased BS bilat bases.   No wheezes, crackles,  Heart:  Regular rate and rhythm; no murmurs, clicks, rubs,  or gallops. Abdomen:  Soft,, nontense , not tympanitic, large,minimally tender diffusely , BS present Rectal:  Deferred -rectal tube in -small amt looses stool Msk:  Symmetrical without gross deformities. . Pulses:  Normal pulses noted. Extremities:  Without clubbing or edema, stasis changes to skin lower Ext. Neurologic: arousable then drifts off, no verbal response Skin:  Intact without significant lesions or rashes.. .  Intake/Output from previous day: 10/21 0701 - 10/22 0700 In: 1950 [I.V.:1950] Out: 2806 [Urine:2550; Emesis/NG output:255; Stool:1] Intake/Output this shift: Total I/O In: -  Out: 50 [Emesis/NG output:50]  Lab Results:  Recent Labs  05/27/15 0920  05/28/15 0410 05/29/15 0047  WBC 7.5 8.3 7.1  HGB 15.2 15.6 17.9*  HCT 45.8 47.6 53.3*  PLT 126* 90* 80*   BMET  Recent Labs  05/27/15 0920 05/28/15 0410 05/29/15 0653  NA 138 143 149*  K 3.3* 4.0 2.8*  CL 99* 105 103  CO2 26 24 29   GLUCOSE 238* 153* 200*  BUN 19 23* 20  CREATININE 1.27* 1.30* 1.48*  CALCIUM 9.1 8.4* 8.6*   LFT  Recent Labs  05/29/15 0653  PROT 6.5  ALBUMIN 2.6*  AST 70*  ALT 59  ALKPHOS 52  BILITOT 1.0   PT/INR No results for input(s): LABPROT, INR in the last 72 hours. Hepatitis Panel No results for input(s): HEPBSAG, HCVAB, HEPAIGM, HEPBIGM in the last 72 hours.    IMPRESSION:   #1  69 yo male with multiple serious comorbidities admitted 05/27/2015 with sepsis/pneumonia. #2 diffuse ileus-multifactorial secondary to sepsis, immobility, chronic narcotic use #3 hypokalemia-contributing to ileus #4 insulin-dependent diabetes mellitus #5 chronic kidney disease stage III #6 coronary artery disease status post CABG #7 CHF #8 chronic pain syndrome- morphine at home  Plan; Continue supportive management-abdominal films reviewed with Dr. Henrene Pastor is no evidence of significant cecal distention or need for colonic decompression -he has more of a diffuse large and small bowel ileus. Continue NG and rectal tube for decompression Replace potassium aggressively to promote bowel function Check abdominal films in a.m. Tomorrow Would anticipate ileus to improve as his sepsis resolves GI will be available for questions    Amy Esterwood  05/29/2015, 11:00 AM  GI ATTENDING  History, labs,x-rays personally reviewed. Patient personally seen and examined. Agree with consultation note as above. Very complex patient with multiple acute and chronic medical problems. Asked to see regarding ileus / ? Ogilvie's  And abnormal x-ray. Patient with soft and benign abdomen (discussed with Dr. Clydene Laming who said that it better today). X-rays with ileus pattern w/o  significant dilation. Agree with multiple plans above including correction of K+. If remains stable can D/C rectal tube in am. We are available if needed. Thanks   Docia Chuck. Geri Seminole., M.D. [facility] Division of Gastroenterology

## 2015-05-29 NOTE — Progress Notes (Signed)
VASCULAR LAB PRELIMINARY  PRELIMINARY  PRELIMINARY  PRELIMINARY  Bilateral lower extremity venous duplex completed.    Preliminary report:  There is no obvious evidence of DVT or SVT noted in the visualized veins of the bilateral lower extremities.   Naryiah Schley, RVT 05/29/2015, 3:15 PM

## 2015-05-29 NOTE — Progress Notes (Signed)
Mark French TEAM 1 - Stepdown/ICU TEAM Progress Note  [name]:[id number] DOB: 04/25/1946 DOA: 05/27/2015 PCP: [name], MD  Admit HPI / Brief Narrative: 69 year old WM PMHx DM Type 2 controlled with SSI and diet, Chronic combined Systolic and Diastolic CHF, severe Pulmonary HTN, CAD S/P CABG, HTN, Complete Heart Block requiring pacemaker placement, HLD, procedure, hypertension, legally blind from diabetic retinopathy, severe peripheral neuropathy, compression fracture low back with chronic pain, childhood stroke 1945, chronic stasis dermatitis lower extremities, CKD stage III, Duodenal Ulcer.  Patient was sent to the ER by his wife via EMS after developing fevers that began yesterday afternoon. Initial fever was 102.3. No apparent associated symptoms such as coughing, nausea, vomiting or diarrhea. Wife reports that as a treat she bought the patient a fast food sandwich shortly after eating a sandwich patient developed fever. Despite utilizing Tylenol patient's fever persisted during the night. It was also noted that with the onset of fever patient had altered mentation and really has not verbally communicated with the wife since onset of fever. He does not appear to have any focal neurological deficits other than not talking. He did receive influenza vaccine.  In the ER the patient had a rectal temperature of 102.6 which is decreased and 98.6 after IV fluids and rectal Tylenol. He is normotensive with a blood pressure 138/62 and MAP of 72. Pulse is regular in the 80s and ventricular paced, respiratory rate between 19 and 24. Patient was empirically placed on 2 L nasal cannula oxygen saturating 100%. Portable chest x-ray was read as mild CHF but has more of an appearance of pneumonitis. Patient was mildly hypokalemic with potassium of 3.3, he also had mild acute renal failure with creatinine of 1.27 and a GFR of 55 with baseline creatinine 1.18. Lactic acid was mildly elevated at 2.1 with  repeat lactic acid several hours later 2.88. In addition to the Tylenol patient has received a total of 1 L of IV fluids in the ER because of elevated lactic acid and fever of unknown origin and he was given empiric Zosyn and vancomycin and blood cultures and urine cultures were obtained. Urinalysis did not appear consistent with a UTI. EKG revealed sinus rhythm with first AV block with prolonged QTC likely from underlying left bundle branch block  Shortly after I evaluated the patient and and prior to my attending evaluating the patient, the patient apparently had an witnessed episode of emesis and when attended to by nursing staff was noted to have increased respiratory rate and decreased O2 saturation of 88% where he had previously been maintaining O2 saturations between 98 and 100% on 2 L nasal cannula.   HPI/Subjective: 10/22 A/O 0,  Eyes are open patient looks around the room, moaning, does not follow commands or answer questions. Wife states that patient has difficulty ambulating at home and uses scooter secondary to lumbar compression fracture. However normally A/O 4, able to complete ADLs  Assessment/Plan: Positive MSSA Staph bacteremia/CAP -Blood cultures 2/2 positive MSSA staph aureus  -CXR; LLL pneumonia? -DC'd broad-spectrum ABx and started ceftriaxone per ID (see below) -Continue emesis so continue IV Reglan and low-dose IV Phenergan (no Zofran in setting of prolonged QT interval) -Patient will most likely require TEE when more stable -Cardiology consult; removal of Pacemaker  Acute respiratory failure with hypoxemia/likely aspiration pneumonia -Continue supportive care with oxygen-due to recent emesis and altered mentation not an appropriate candidate for BiPAP at this juncture -Titrate O2 to maintain SPO2> 93%  Bilateral pleural effusion -  10/22 PCXR shows worsening pulmonary edema and bilateral pleural effusion  Metabolic encephalopathy -Suspect related to fever and  underlying infectious processes -NPO until more alert  AKI (HCC) on Chronic kidney disease, stage III (moderate) -Trended up slightly overnight continue monitor closely -Continue gentle volume resuscitation in setting of known systolic dysfunction which is chronic -Hold diuretics and any other offending medications  Generalized ileus/Ogilvie syndrome? -Most likely multifactorial to include chronic high-dose narcotic use, MSSA Staph bacteremia  -Place NG tube; low continuous suction -NOTE; patient seen in the past by Dr. Delfin Edis ([facility]) for chronic abdominal pain. Last seen 05/19/2014. Per her note patient has never had colonoscopy. -GI consulted  Thrombocytopenia ([place]) -New thrombocytopenia with platelets< 100,000 -Suspect related to underlying infectious process and acute inflammation  Hypokalemia -Potassium goal> 4 - Potassium IV 74meq -Recheck K & Mg @ 1300  HTN (hypertension) -Relatively hypotensive hold BP medication   CAD of autologous vein bypass graft -Patient somnolent/obtunded; troponin trending up cardiology consulted -Preadmission statin, Plavix, Imdur as well as carvedilol on hold while NPO  Chronic systolic and diastolic heart failure [facility]) -Patient's cardiologist is Dr.Michael Burt Knack. Last seen 12/29/2014 -Ejection fraction 40%  last echocardiogram March 2015 revealed severe hypokinesis of the inferior wall with left ventricular dilatation -Echocardiogram 10/21; slightly worsening EF see results below -Strict in and out since admission + 1.2 L -Daily a.m. Weight   Pacemaker -Currently actively pacing -Patient will MSSA staph bacteremia pacer will need to be removed if possible  Pulmonary HTN (HCC)/COPD -Severe with most recent reading of 83 mmHg in 2015 -Imdur on hold  Chronic pain syndrome -due to ongoing peripheral neuropathy and apparent lumbar compression fracture -On methadone, Norco, morphine sulfate at home but currently holding all  sedative medications due to acute metabolic encephalopathy  Diabetes mellitus type II, controlled ([place]) -Current CBGs greater than 200 and likely related to acute infectious processes -10/20 Hemoglobin A1c= 6.6 -Sensitive SSI   HYPERCHOLESTEROLEMIA -Statin on hold  Iron deficiency anemia -Current hemoglobin 15.2 with baseline hemoglobin around 13 consistent with hemoconcentration and volume depletion  Peripheral neuropathy (HCC) -Preadmission narcotics and gabapentin on hold  BLINDNESS -Secondary to diabetic retinopathy  DVT?  -Patient with palpable cords left calf, bilateral poor circulation -Will obtain bilateral lower extremity venous Doppler    Code Status: FULL Family Communication: Wife present at time of exam Disposition Plan: Resolution sepsis    Consultants: Dr. Scarlette Shorts ([facility]) Dr. Sanda Klein (cardiology) Dr. Dietrich [place] (ID)     Procedure/Significant Events: 10/27/2013 echocardiogram;- LVEF= Inferior wall severely hypokinetic; moderately dilated.  -LVEF= 40%.- Left atrium: moderately dilated. - Pulmonary arteries: PA peak pressure: 63mm Hg (S). 10/21 echocardiogram; LVEF= 30% - 35%. Diffuse hypokinesis.-(grade 1 diastolic dysfunction).     Culture 10/20 blood left/right AC positive MSSA staph 10/20 MRSA by PCR negative 10/20 influenza A/B/H1N1 negative 10/20 Respiratory virus panel negative   Antibiotics: Zosyn 10/20>> stopped 10/22 Vancomycin 10/20>> stopped 10/22 Ceftriaxone 10/22>>   DVT prophylaxis: Lovenox   Devices    LINES / TUBES:      Continuous Infusions: . dextrose 5 % with KCl 20 mEq / L 20 mEq (05/29/15 1500)    Objective: VITAL SIGNS: Temp: 98.1 F (36.7 C) (10/22 1958) Temp Source: Axillary (10/22 1958) BP: 163/82 mmHg (10/22 1958) Pulse Rate: 88 (10/22 1958) SPO2; FIO2:   Intake/Output Summary (Last 24 hours) at 05/29/15 2052 Last data filed at 05/29/15 1958  Gross per 24 hour  Intake    710  ml  Output   1926 ml  Net  -1216 ml     Exam: General: A/O 0,  Eyes are open patient looks around the room, moaning, does not follow commands or answer questions,  No acute respiratory distress Eyes: negative scleral hemorrhage ENT: Negative Runny nose, gingival bleeding, Neck:  Negative scars, masses, torticollis, lymphadenopathy, JVD Lungs: Clear to auscultation bilaterally without wheezes or crackles Cardiovascular: Regular rate and rhythm without murmur gallop or rub normal S1 and S2 Abdomen:negative abdominal pain, positive distention, positive soft, bowel sounds, no rebound, no ascites, no appreciable mass Extremities: positive bilateral significant cyanosis Lt>>Rt , multiple left metatarsals gangrenous ulcers. Psychiatric:  Unable to assess  Neurologic:  Withdraws R LE/RUE/LUE to painful stimuli.    Data Reviewed: Basic Metabolic Panel:  Recent Labs Lab 05/27/15 0920 05/27/15 1650 05/28/15 0410 05/29/15 0653 05/29/15 1145  NA 138  --  143 149*  --   K 3.3*  --  4.0 2.8* 4.0  CL 99*  --  105 103  --   CO2 26  --  24 29  --   GLUCOSE 238*  --  153* 200*  --   BUN 19  --  23* 20  --   CREATININE 1.27*  --  1.30* 1.48*  --   CALCIUM 9.1  --  8.4* 8.6*  --   MG  --  2.1  --  2.3 2.5*  PHOS  --  3.4  --   --   --    Liver Function Tests:  Recent Labs Lab 05/27/15 0920 05/28/15 0410 05/29/15 0653  AST 26 62* 70*  ALT 23 49 59  ALKPHOS 69 55 52  BILITOT 0.8 0.7 1.0  PROT 7.3 6.1* 6.5  ALBUMIN 3.9 2.9* 2.6*    Recent Labs Lab 05/29/15 0855  LIPASE 37  AMYLASE 53   No results for input(s): AMMONIA in the last 168 hours. CBC:  Recent Labs Lab 05/27/15 0920 05/28/15 0410 05/29/15 0047  WBC 7.5 8.3 7.1  NEUTROABS 6.7  --  5.8  HGB 15.2 15.6 17.9*  HCT 45.8 47.6 53.3*  MCV 92.0 93.0 92.1  PLT 126* 90* 80*   Cardiac Enzymes:  Recent Labs Lab 05/28/15 1953 05/29/15 0653  TROPONINI 0.29* 1.13*   BNP (last 3 results) No results for input(s):  BNP in the last 8760 hours.  ProBNP (last 3 results)  Recent Labs  08/25/14 1041  PROBNP 179.0*    CBG:  Recent Labs Lab 05/29/15 0429 05/29/15 0818 05/29/15 1144 05/29/15 1547 05/29/15 1956  GLUCAP 173* 208* 160* 147* 140*    Recent Results (from the past 240 hour(s))  Blood Culture (routine x 2)     Status: None   Collection Time: 05/27/15  9:20 AM  Result Value Ref Range Status   Specimen Description BLOOD LEFT ANTECUBITAL  Final   Special Requests BOTTLES DRAWN AEROBIC AND ANAEROBIC 5ML  Final   Culture  Setup Time   Final    GRAM POSITIVE COCCI IN CLUSTERS IN BOTH AEROBIC AND ANAEROBIC BOTTLES CRITICAL RESULT CALLED TO, READ BACK BY AND VERIFIED WITH: Jodi Marble RN 2250 05/27/15 A BROWNING    Culture STAPHYLOCOCCUS AUREUS  Final   Report Status 05/29/2015 FINAL  Final   Organism ID, Bacteria STAPHYLOCOCCUS AUREUS  Final      Susceptibility   Staphylococcus aureus - MIC*    CIPROFLOXACIN <=0.5 SENSITIVE Sensitive     ERYTHROMYCIN 0.5 SENSITIVE Sensitive     GENTAMICIN <=0.5  SENSITIVE Sensitive     OXACILLIN <=0.25 SENSITIVE Sensitive     TETRACYCLINE <=1 SENSITIVE Sensitive     VANCOMYCIN 1 SENSITIVE Sensitive     TRIMETH/SULFA <=10 SENSITIVE Sensitive     CLINDAMYCIN <=0.25 SENSITIVE Sensitive     RIFAMPIN <=0.5 SENSITIVE Sensitive     Inducible Clindamycin NEGATIVE Sensitive     * STAPHYLOCOCCUS AUREUS  Blood Culture (routine x 2)     Status: None   Collection Time: 05/27/15  9:39 AM  Result Value Ref Range Status   Specimen Description BLOOD RIGHT ANTECUBITAL  Final   Special Requests BOTTLES DRAWN AEROBIC AND ANAEROBIC 5ML  Final   Culture  Setup Time   Final    GRAM POSITIVE COCCI IN CLUSTERS IN BOTH AEROBIC AND ANAEROBIC BOTTLES CRITICAL RESULT CALLED TO, READ BACK BY AND VERIFIED WITH: R TARWATER RN 2250 05/27/15 A BROWNING    Culture   Final    STAPHYLOCOCCUS AUREUS SUSCEPTIBILITIES PERFORMED ON PREVIOUS CULTURE WITHIN THE LAST 5 DAYS.     Report Status 05/29/2015 FINAL  Final  Urine culture     Status: None   Collection Time: 05/27/15  9:52 AM  Result Value Ref Range Status   Specimen Description URINE, CLEAN CATCH  Final   Special Requests NONE  Final   Culture NO GROWTH 1 DAY  Final   Report Status 05/28/2015 FINAL  Final  Respiratory virus panel     Status: None   Collection Time: 05/27/15 12:43 PM  Result Value Ref Range Status   Source - RVPAN NASAL SWAB  Corrected   Respiratory Syncytial Virus A Negative Negative Final   Respiratory Syncytial Virus B Negative Negative Final   Influenza A Negative Negative Final   Influenza B Negative Negative Final   Parainfluenza 1 Negative Negative Final   Parainfluenza 2 Negative Negative Final   Parainfluenza 3 Negative Negative Final   Metapneumovirus Negative Negative Final   Rhinovirus Negative Negative Final   Adenovirus Negative Negative Final    Comment: (NOTE) Performed At: [facility] [address] [place], [place] [id number] Lindon Romp MD SK:[id number]   MRSA PCR Screening     Status: None   Collection Time: 05/27/15  4:25 PM  Result Value Ref Range Status   MRSA by PCR NEGATIVE NEGATIVE Final    Comment:        The GeneXpert MRSA Assay (FDA approved for NASAL specimens only), is one component of a comprehensive MRSA colonization surveillance program. It is not intended to diagnose MRSA infection nor to guide or monitor treatment for MRSA infections.      Studies:  Recent x-ray studies have been reviewed in detail by the Attending Physician  Scheduled Meds:  Scheduled Meds: . cefTRIAXone (ROCEPHIN)  IV  2 g Intravenous Q24H  . enoxaparin (LOVENOX) injection  40 mg Subcutaneous Q24H  . insulin aspart  0-9 Units Subcutaneous 6 times per day  . metoCLOPramide (REGLAN) injection  5 mg Intravenous 4 times per day    Time spent on care of this patient: 40 mins   Whitnee Orzel, Geraldo Docker , MD  [facility] Office   [phone] Pager - [phone]  On-Call/Text Page:      [email or web address]      password [email or web address]  If 7PM-7AM, please contact night-coverage [email or web address] Password [email or web address] 05/29/2015, 8:52 PM   LOS: 2 days   Care during the described time interval was provided by me .  I have reviewed this patient's available data, including medical  history, events of note, physical examination, and all test results as part of my evaluation. I have personally reviewed and interpreted all radiology studies.   Dia Crawford, MD [phone] Pager

## 2015-05-29 NOTE — Progress Notes (Signed)
Daily Progress Note   Patient Name: Mark French       Date: 05/29/2015 DOB: 02-12-1946  Age: 69 y.o. MRN#: [id number] Attending Physician: Allie Bossier, MD Primary Care Physician: [name], MD Admit Date: 05/27/2015  Reason for Consultation/Follow-up: Establishing goals of care  Subjective: Pt went into pulm flash edema last night. NRB mask placed. He is more alert today but still cannot FC, non-verbal. He does grunt and groan to touch and voice. Bld cx back and positive for staph. Met with wife, Leda Gauze, and spoke with BIL on the phone and updated current clinical condition. Still awaiting consensus on whether pacemaker needs to be removed or if so if pt is a candidate to go thru this given his mult-system frailty. ECHO done and EF now 30-35, decreased from 40% 2015 Interval Events: Worsening resp status;  Length of Stay: 2 days  Current Medications: Scheduled Meds:  . cefTRIAXone (ROCEPHIN)  IV  2 g Intravenous Q24H  . enoxaparin (LOVENOX) injection  40 mg Subcutaneous Q24H  . insulin aspart  0-9 Units Subcutaneous 6 times per day  . metoCLOPramide (REGLAN) injection  5 mg Intravenous 4 times per day    Continuous Infusions: . dextrose 5 % with KCl 20 mEq / L 20 mEq (05/29/15 1400)    PRN Meds: acetaminophen **OR** acetaminophen, morphine injection, promethazine  Physical Exam: Physical Exam  Constitutional: He appears well-developed.  HENT:  Head: Normocephalic.  Pulmonary/Chest: He is in respiratory distress.  Abdominal: Soft. He exhibits distension.  Skin: Skin is warm.                Vital Signs: BP 131/67 mmHg  Pulse 82  Temp(Src) 98.9 F (37.2 C) (Oral)  Resp 26  Ht _0  (1.549 m)  Wt 83.1 kg (183 lb 3.2 oz)  BMI 34.63 kg/m2  SpO2 98% SpO2: SpO2:  98 % O2 Device: O2 Device: NRB O2 Flow Rate: O2 Flow Rate (L/min): 15 L/min  Intake/output summary:  Intake/Output Summary (Last 24 hours) at 05/29/15 1451 Last data filed at 05/29/15 1400  Gross per 24 hour  Intake   1300 ml  Output   2431 ml  Net  -1131 ml   LBM:   Baseline Weight: Weight: 86.183 kg (190 lb) Most recent weight: Weight: 83.1 kg (183 lb  3.2 oz)       Palliative Assessment/Data: Flowsheet Rows        Most Recent Value   Intake Tab    Referral Department  Hospitalist   Unit at Time of Referral  Intermediate Care Unit   Palliative Care Primary Diagnosis  Sepsis/Infectious Disease   Date Notified  05/28/15   Palliative Care Type  New Palliative care   Reason for referral  Clarify Goals of Care   Date of Admission  05/27/15   Date first seen by Palliative Care  05/28/15   # of days Palliative referral response time  0 Day(s)   # of days IP prior to Palliative referral  1   Clinical Assessment    Palliative Performance Scale Score  20%   Pain Max last 24 hours  Not able to report   Pain Min Last 24 hours  Not able to report   Dyspnea Max Last 24 Hours  Not able to report   Dyspnea Min Last 24 hours  Not able to report   Nausea Max Last 24 Hours  Not able to report   Nausea Min Last 24 Hours  Not able to report   Anxiety Max Last 24 Hours  Not able to report   Anxiety Min Last 24 Hours  Not able to report   Other Max Last 24 Hours  Not able to report   Psychosocial & Spiritual Assessment    Social Work Plan of Care  Advance care planning   Palliative Care Outcomes    Patient/Family meeting held?  Yes   Who was at the meeting?  wife   Palliative Care Outcomes  Clarified goals of care   Patient/Family wishes: Interventions discontinued/not started   Mechanical Ventilation   Palliative Care follow-up planned  Yes, Facility      Additional Data Reviewed: Recent Labs     05/28/15  0410  05/29/15  0047  05/29/15  0653  WBC  8.3  7.1   --   HGB  15.6   17.9*   --   PLT  90*  80*   --   NA  143   --   149*  BUN  23*   --   20  CREATININE  1.30*   --   1.48*     Problem List:  Patient Active Problem List   Diagnosis Date Noted  . Pressure ulcer 05/29/2015  . Cardiomyopathy, ischemic-30-35% 05/29/2015  . Hospice care patient- currently limited code 05/29/2015  . Ileus ([place])   . Staphylococcus aureus bacteremia with sepsis ([place])   . Pacemaker infection ([place])   . Adynamic ileus ([place])   . Abnormal x-ray of abdomen   . Hypokalemia   . Palliative care encounter   . Aspiration pneumonia ([place])   . Controlled diabetes mellitus type 2 with complications ([place])   . HLD (hyperlipidemia)   . Cardiac pacemaker in situ   . Peripheral polyneuropathy ([place])   . AKI (acute kidney injury) ([place]) 05/27/2015  . Thrombocytopenia ([place]) 05/27/2015  . FUO (fever of unknown origin) 05/27/2015  . Metabolic encephalopathy 33/29/5188  . Pulmonary HTN ([place]) 05/27/2015  . Low back pain 03/23/2015  . PAD (peripheral artery disease) ([place]) 03/03/2015  . DNR (do not resuscitate) 10/29/2013  . Diabetes mellitus type II, uncontrolled ([place]) 10/25/2013  . NSTEMI (non-ST elevated myocardial infarction) ([place]) 10/25/2013  . Chronic pain syndrome 09/05/2013  . Pacemaker- [place]- Jan 2015 08/27/2013  . Hearing loss 08/21/2013  .  Obstructive lung disease ([place]) 02/12/2013  . Chronic kidney disease, stage III (moderate) 10/15/2012  . Prostatism 06/25/2012  . Screening for prostate cancer 04/16/2012  . Unstable angina ([place]) 01/12/2012  . Peripheral vascular disease ([place]) 08/17/2010  . PROLIFERATIVE DIABETIC RETINOPATHY 03/09/2009  . HYPERCHOLESTEROLEMIA 09/28/2008  . HYPOGONADISM 09/23/2008  . Iron deficiency anemia 09/23/2008  . DEPRESSION 09/23/2008  . Peripheral neuropathy ([place]) 09/23/2008  . MYOCARDIAL INFARCTION, HX OF 09/23/2008  . Chronic systolic heart failure ([place]) 09/23/2008  . ALLERGIC RHINITIS 09/23/2008  . CERVICAL RADICULOPATHY, LEFT 09/23/2008   . CEREBROVASCULAR ACCIDENT, HX OF 09/23/2008  . BLINDNESS 05/21/2008  . HTN (hypertension) 03/11/2007  . CAD of vein bypass graft 03/11/2007     Palliative Care Assessment & Plan    1.Code Status:  Limited code    Code Status Orders        Start     Ordered   05/27/15 1526  Limited resuscitation (code)   Continuous    Question Answer Comment  In the event of cardiac or respiratory ARREST: Initiate Code Blue, Call Rapid Response Yes   In the event of cardiac or respiratory ARREST: Perform CPR No   In the event of cardiac or respiratory ARREST: Perform Intubation/Mechanical Ventilation No   In the event of cardiac or respiratory ARREST: Use NIPPV/BiPAp only if indicated Yes   In the event of cardiac or respiratory ARREST: Administer ACLS medications if indicated Yes   In the event of cardiac or respiratory ARREST: Perform Defibrillation or Cardioversion if indicated No      05/27/15 1525       2. Goals of Care:  No CPR, defibrillation but may usem pressors, BIPAP  Limitations on Scope of Treatment: Awaiting cardiology in-put re: pacemaker removal  Desire for further Chaplaincy support:no  Psycho-social Needs: Caregiving  Support/Resources  3. Symptom Management:      1.Pain: Difficult to assess even though pt is moaning, but no grimacing. Cont prn ms04      2. Dyspnea: Cont targeted 02 therapy unchanged for now including BIPAP; opioids PRN  4. Palliative Prophylaxis:   Rectal tube in place  5. Prognosis: < 6 months. Pt likely meets hospice in the home currently but if he continues to decline and becomes EOL/full comfort care could consider hospice in-pt referral  6. Discharge Planning:  Pt likely will need SNF for deconditioning if he survives this hospitalization and if this is in keeping with pt goals. Wife would be a solo caregiver and even with hospice support this would be difficult. Will cont to work with pt and spouse as this goes forward as to  disposition   Thank you for allowing the Palliative Medicine Team to assist in the care of this patient.  Time In: 1300 Time Out: 1400 Total Time 60 Prolonged Time Billed  no         Dory Horn, NP  05/29/2015, 2:51 PM  Please contact Palliative Medicine Team phone at [phone] for questions and concerns.

## 2015-05-29 NOTE — Progress Notes (Signed)
CRITICAL VALUE ALERT  Critical value received:  Troponin 1.13  Date of notification:  05/29/2015  Time of notification:  0745  Critical value read back:yes   Nurse who received alert:  Austin Miles   MD notified (1st page):  Woods,C  Time of first page:  [phone]  MD notified (2nd page):  Time of second page:  Responding MD:  Philis Pique  Time MD responded:  [phone]

## 2015-05-29 NOTE — Consult Note (Signed)
Reason for Consult:   Consider pacemaker system removal  Requesting Physician: Dr Tommy Medal Primary Cardiologist Dr Burt Knack  HPI:  69 y.o. 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 He had CASBG in 1998 and an SVG-Dx DES in Jan 2013.  He was hospitalized Jan 2015 with SOB and chest pain. He had a cardiac catheterization on that admission which showed severe native 3 vessel disease with LIMA-LAD and SVG-Diag patent, CFX occluded and RCA with severe diffuse proximal and distal disease. The SVG-OM and SVG-RCA are known to be occluded. Medical therapy was recommended. He was also found to be in complete heart block and he had a St Jude BiV PPM placed, on 08/08/13. His EF was 40% by echo at that time.          The patient was readmitted to the hospital on 10/25/13 with hypoxia initially requiring BiPAP.He had a NSTEMI with peak troponin went up to 5. and repeat cardiac catheterization was performed. This showed diffuse triple vessel disease and agin medical Rx was recomended. 2-D echo showed ejection fraction remained at 40%. After discussion with the family and patient, palliative medicine was recommended. He is now being followed by hospice.           His LOV with Dr Burt Knack was in June 2016. He had been doing pretty well at home. He is admitted now with fever and altered mental status. He has been diagnosed with Staph sepsis. We are consulted now to consider removal of his pacemaker and leads to allow for several weeks of antibiotic treatment.   PMHx:  Past Medical History  Diagnosis Date  . CORONARY ARTERY DISEASE     a. s/p CABG 1998. b.  08/2011: inferior STEMI.  LHC 09/07/11: Severe 3v CAD, LIMA-LAD patent, SVG-RCA chronically occluded, SVG-circumflex chronically occluded, SVG-diagonal 95%. PCI: Promus DES to the SVG-diagonal.  Procedure c/b  transient CHB req CPR and TTVP. c. Myoview 11/2011: scar but no ischemia.   [name] DIABETES MELLITUS, TYPE I   . HYPERTENSION   . 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 HYPERCHOLESTEROLEMIA   . CERVICAL RADICULOPATHY, LEFT   . PERIPHERAL NEUROPATHY   . Chronic systolic heart failure (HCC)     Echocardiogram 08/09/13:Cavity size normal, EF 40%, inferior hypokinesis, grade 1 diastolic dysfunction, paradoxical septal motion, mild MR, mild LAE, no PFO, PAS 51, moderate pulm HTN  . ED (erectile dysfunction)   . Hypogonadism male   . GI bleed 2012    Multiple Duodenal ulcer   . Obesity   . Legally blind     bilaterally  . Stroke [facility]) 1945    age 75 months; residual right sided weakness  . History of blood transfusion 2012  . Charcot foot due to diabetes mellitus (HCC)     chronic pain  . Osteoarthritis     right hip and shoulder  . COPD (chronic obstructive pulmonary disease) ([place])   . 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 Hiatal hernia   . Chronic stasis dermatitis     Chronic stasis changes.  . CKD (chronic kidney disease), stage III   . Syncope     a. 07/2013: suspected due to hypotension.  . Biventricular cardiac pacemaker in situ     a. [employer] RF  model [id number] (serial number [id number])  . Complete heart block (HCC)     a. s/p pacemaker placement on 08/2013 admission  . Duodenal ulcer     Past Surgical History  Procedure Laterality Date  . Carpal tunnel release Left   . Sigmoidoscopy  03/11/2001  . Venous doppler  01/30/2004  . Tonsillectomy and adenoidectomy      "as a kid"  . Cataract extraction w/ intraocular lens  implant, bilateral    . Coronary angioplasty with stent placement  08/2011    "1"  . Retinopathy surgery Bilateral 2000's    "laser; both eyes"  . Wrist fusion Right 1976  . Anterior fusion cervical spine  2010    "C spine; Dr. Ronnald Ramp"  . Ankle fracture surgery Left 1976; ?date    LEFT:  fused; removed bulk  of hardware  . Heel spur surgery Left ~ 2007    ? left  . Irrigation and debridement knee  02/27/2012    Procedure: IRRIGATION AND DEBRIDEMENT KNEE;  Surgeon: Augustin Schooling, MD;  Location: [facility];  Service: Orthopedics;  Laterality: Right;  irrigation and drainage right knee septic bursitis  . Eye surgery  2008    cataract removal, cautery  . Coronary artery bypass graft  1998    CABG X5  . Fracture surgery      foot broken 1976  . Pacemaker insertion  08/08/2013    Biventricular pacemaker: [place] RF model [id number] (serial number [id number])  . Left heart catheterization with coronary/graft angiogram N/A 09/07/2011    Procedure: LEFT HEART CATHETERIZATION WITH [name];  Surgeon: Jettie Booze, MD;  Location: [facility] CATH LAB;  Service: Cardiovascular;  Laterality: N/A;  . Temporary pacemaker insertion  08/07/2013    Procedure: TEMPORARY PACEMAKER INSERTION;  Surgeon: Leonie Man, MD;  Location: [facility] CATH LAB;  Service: Cardiovascular;;  . Left heart catheterization with coronary/graft angiogram  08/07/2013    Procedure: LEFT HEART CATHETERIZATION WITH [name];  Surgeon: Leonie Man, MD;  Location: [facility] CATH LAB;  Service: Cardiovascular;;  . Bi-ventricular pacemaker insertion N/A 08/08/2013    Procedure: BI-VENTRICULAR PACEMAKER INSERTION (CRT-P);  Surgeon: Coralyn Mark, MD;  Location: [facility] CATH LAB;  Service: Cardiovascular;  Laterality: N/A;  . Left heart catheterization with coronary/graft angiogram N/A 10/27/2013    Procedure: LEFT HEART CATHETERIZATION WITH [name];  Surgeon: Sinclair Grooms, MD;  Location: [facility] CATH LAB;  Service: Cardiovascular;  Laterality: N/A;    SOCHx:  reports that he has never smoked. He has never used smokeless tobacco. He reports that he does not drink alcohol or use illicit drugs.  FAMHx: Family History  Problem Relation Age of Onset  . CAD Mother     Coronary Artery Disease  . CAD  Father     Coronary Artery Disease  . CAD Paternal Grandmother     Coronary Artery Disease  . Colon cancer Neg Hx   . Diabetes Maternal Grandmother   . Cancer Maternal Grandmother     male cancer ? type  . Diabetes Brother     ALLERGIES: Allergies  Allergen Reactions  . Sildenafil Nausea Only and Other (See Comments)    "took it once; heart went to pieces; never took it again"  . Percocet [Oxycodone-Acetaminophen] Other (See Comments)    Unknown reaction-possible altered mental state-per wife.    . Contrast Media [Iodinated Diagnostic Agents] Nausea Only    ROS: Review of Systems: unobtainable due to patient  factors. Prior to this illness family says he had done the best he had in several months.   HOME MEDICATIONS: Prior to Admission medications   Medication Sig Start Date End Date Taking? Authorizing Provider  AMBULATORY NON FORMULARY MEDICATION Pt on O2 - 2 liter   Yes Historical Provider, MD  aspirin EC 81 MG tablet Take 81 mg by mouth at bedtime.   Yes Historical Provider, MD  atorvastatin (LIPITOR) 40 MG tablet TAKE 1 TABLET BY MOUTH EVERY DAY 04/21/15  Yes Sherren Mocha, MD  Blood Glucose Monitoring Suppl (BAYER BREEZE 2 SYSTEM) W/DEVICE KIT 1 Device by Does not apply route once. 11/20/14  Yes [name], MD  calcitRIOL (ROCALTROL) 0.25 MCG capsule TAKE 1 CAPSULE BY MOUTH EVERY DAY 05/24/15  Yes [name], MD  carvedilol (COREG) 3.125 MG tablet TAKE 1 TABLET BY MOUTH TWICE DAILY WITH A MEAL 12/15/14  Yes Thompson Grayer, MD  clopidogrel (PLAVIX) 75 MG tablet TAKE 1 TABLET BY MOUTH EVERY DAY 02/24/15  Yes Sherren Mocha, MD  co-enzyme Q-10 30 MG capsule Take 30 mg by mouth daily. @@ 2pm   Yes Historical Provider, MD  docusate sodium (COLACE) 100 MG capsule Take 300 mg by mouth daily.    Yes Historical Provider, MD  famotidine (PEPCID) 20 MG tablet Take 20 mg by mouth before cath procedure. stomach   Yes Historical Provider, MD  famotidine (PEPCID) 20 MG tablet Take 20 mg  by mouth daily as needed. indigestion 04/29/15  Yes Historical Provider, MD  furosemide (LASIX) 40 MG tablet TAKE 2 TABLETS BY MOUTH TWICE DAILY 02/24/15  Yes Sherren Mocha, MD  gabapentin (NEURONTIN) 400 MG capsule Take 2 capsules (800 mg total) by mouth 3 (three) times daily. 04/16/15  Yes [name], MD  HYDROcodone-acetaminophen (NORCO) 10-325 MG tablet Take 1-2 tablets by mouth every 4 (four) hours as needed for moderate pain. 05/14/15  Yes [name], MD  hyoscyamine (LEVSIN SL) 0.125 MG SL tablet DISSOLVE 1 TABLET UNDER THE TONGUE EVERY 4 HOURS AS NEEDED 03/25/15  Yes Lafayette Dragon, MD  isosorbide mononitrate (IMDUR) 30 MG 24 hr tablet TAKE 1 TABLET BY MOUTH EVERY DAY 01/06/15  Yes Sherren Mocha, MD  LORazepam (ATIVAN) 0.5 MG tablet Take 1 tablet (0.5 mg total) by mouth every 6 (six) hours as needed for anxiety (anxiety). 04/21/15  Yes [name], MD  losartan (COZAAR) 50 MG tablet TAKE 1/2 TABLET BY MOUTH EVERY DAY 09/10/14  Yes Sherren Mocha, MD  methadone (DOLOPHINE) 5 MG tablet Take 1 tablet (5 mg total) by mouth 2 (two) times daily. 05/14/15  Yes [name], MD  Morphine Sulfate (MORPHINE CONCENTRATE) 10 mg / 0.5 ml concentrated solution Place 0.25-0.5 mLs (5-10 mg total) under the tongue every 2 (two) hours as needed for moderate pain, severe pain, anxiety or shortness of breath. 10/30/13  Yes Debbe Odea, MD  nitroGLYCERIN (NITROSTAT) 0.4 MG SL tablet Place 1 tablet (0.4 mg total) under the tongue every 5 (five) minutes as needed for chest pain. 10/29/12  Yes Sherren Mocha, MD  OVER THE COUNTER MEDICATION Place 1 drop into both eyes daily as needed (dry eyes). [facility] eye drops   Yes Historical Provider, MD  pantoprazole (PROTONIX) 40 MG tablet Take 1 tablet (40 mg total) by mouth 2 (two) times daily. 05/14/15  Yes Manus Gunning, MD  polyethylene glycol powder [facility]) powder Take 1 Container by mouth daily.  08/18/14  Yes Historical Provider, MD  polyethylene glycol  powder (GLYCOLAX/MIRALAX) powder DISSOLVE 9  GRAMS( 1/2 CAPFUL) IN AT LEAST 8 OUNCES OF WATER/ JUICE AND DRINK THREE TIMES WEEKLY 03/02/15  Yes [place] Dragon, MD  potassium chloride SA (K-DUR,KLOR-CON) 20 MEQ tablet TAKE 1 TABLET BY MOUTH TWICE DAILY 02/05/15  Yes Sherren Mocha, MD  Sennosides-Docusate Sodium (SENNA PLUS PO) Take 1-4 capsules by mouth every morning. 03/25/15  Yes Historical Provider, MD  tamsulosin (FLOMAX) 0.4 MG CAPS capsule TAKE 1 CAPSULE BY MOUTH DAILY 05/17/15  Yes [name], MD  tolnaftate (ANTI-FUNGAL) 1 % powder Apply 1 application topically 3 (three) times a week. As needed for infection   Yes Historical Provider, MD  dicyclomine (BENTYL) 20 MG tablet Take 1 tablet (20 mg total) by mouth 2 (two) times daily. Patient not taking: Reported on 05/27/2015 12/23/14   [place] Dragon, MD  hyoscyamine (LEVSIN SL) 0.125 MG SL tablet DISSOLVE 1 TABLET UNDER THE TONGUE EVERY 4 HOURS AS NEEDED Patient not taking: Reported on 05/27/2015 05/04/15   Manus Gunning, MD    HOSPITAL MEDICATIONS: I have reviewed the patient's current medications.  VITALS: Blood pressure 131/67, pulse 82, temperature 98.9 F (37.2 C), temperature source Oral, resp. rate 26, height _0  (1.549 m), weight 183 lb 3.2 oz (83.1 kg), SpO2 98 %.  PHYSICAL EXAM: General appearance: awake but not responsive, NG oand O2 in place Neck: no JVD Lungs: decreased breath sounds, scattered rhonchi Heart: irregularly irregular rhythm Abdomen: distended, NG in place for ileus Extremities: chronic venous skin changes, trace edema Pulses: diminnished Skin: pale, cool, moist Neurologic: Awake but does not respond, mumbling  LABS: Results for orders placed or performed during the hospital encounter of 05/27/15 (from the past 24 hour(s))  Lactic acid, plasma     Status: None   Collection Time: 05/28/15  3:15 PM  Result Value Ref Range   Lactic Acid, Venous 1.2 0.5 - 2.0 mmol/L  Glucose, capillary     Status:  Abnormal   Collection Time: 05/28/15  4:48 PM  Result Value Ref Range   Glucose-Capillary 155 (H) 65 - 99 mg/dL   Comment 1 Document in Chart   Troponin I (q 6hr x 3)     Status: Abnormal   Collection Time: 05/28/15  7:53 PM  Result Value Ref Range   Troponin I 0.29 (H) <0.031 ng/mL  Glucose, capillary     Status: Abnormal   Collection Time: 05/28/15  8:26 PM  Result Value Ref Range   Glucose-Capillary 170 (H) 65 - 99 mg/dL   Comment 1 Notify RN   Glucose, capillary     Status: Abnormal   Collection Time: 05/29/15 12:30 AM  Result Value Ref Range   Glucose-Capillary 173 (H) 65 - 99 mg/dL   Comment 1 Notify RN   CBC with Differential/Platelet     Status: Abnormal   Collection Time: 05/29/15 12:47 AM  Result Value Ref Range   WBC 7.1 4.0 - 10.5 K/uL   RBC 5.79 4.22 - 5.81 MIL/uL   Hemoglobin 17.9 (H) 13.0 - 17.0 g/dL   HCT 53.3 (H) 39.0 - 52.0 %   MCV 92.1 78.0 - 100.0 fL   MCH 30.9 26.0 - 34.0 pg   MCHC 33.6 30.0 - 36.0 g/dL   RDW 14.2 11.5 - 15.5 %   Platelets 80 (L) 150 - 400 K/uL   Neutrophils Relative % 81 %   Neutro Abs 5.8 1.7 - 7.7 K/uL   Lymphocytes Relative 8 %   Lymphs Abs 0.6 (L) 0.7 - 4.0 K/uL  Monocytes Relative 11 %   Monocytes Absolute 0.8 0.1 - 1.0 K/uL   Eosinophils Relative 0 %   Eosinophils Absolute 0.0 0.0 - 0.7 K/uL   Basophils Relative 0 %   Basophils Absolute 0.0 0.0 - 0.1 K/uL  Glucose, capillary     Status: Abnormal   Collection Time: 05/29/15  4:29 AM  Result Value Ref Range   Glucose-Capillary 173 (H) 65 - 99 mg/dL   Comment 1 Notify RN   Troponin I (q 6hr x 3)     Status: Abnormal   Collection Time: 05/29/15  6:53 AM  Result Value Ref Range   Troponin I 1.13 (HH) <0.031 ng/mL  Comprehensive metabolic panel     Status: Abnormal   Collection Time: 05/29/15  6:53 AM  Result Value Ref Range   Sodium 149 (H) 135 - 145 mmol/L   Potassium 2.8 (L) 3.5 - 5.1 mmol/L   Chloride 103 101 - 111 mmol/L   CO2 29 22 - 32 mmol/L   Glucose, Bld 200  (H) 65 - 99 mg/dL   BUN 20 6 - 20 mg/dL   Creatinine, Ser 1.48 (H) 0.61 - 1.24 mg/dL   Calcium 8.6 (L) 8.9 - 10.3 mg/dL   Total Protein 6.5 6.5 - 8.1 g/dL   Albumin 2.6 (L) 3.5 - 5.0 g/dL   AST 70 (H) 15 - 41 U/L   ALT 59 17 - 63 U/L   Alkaline Phosphatase 52 38 - 126 U/L   Total Bilirubin 1.0 0.3 - 1.2 mg/dL   GFR calc non Af Amer 46 (L) >60 mL/min   GFR calc Af Amer 53 (L) >60 mL/min   Anion gap 17 (H) 5 - 15  Magnesium     Status: None   Collection Time: 05/29/15  6:53 AM  Result Value Ref Range   Magnesium 2.3 1.7 - 2.4 mg/dL  Glucose, capillary     Status: Abnormal   Collection Time: 05/29/15  8:18 AM  Result Value Ref Range   Glucose-Capillary 208 (H) 65 - 99 mg/dL   Comment 1 Notify RN   Amylase     Status: None   Collection Time: 05/29/15  8:55 AM  Result Value Ref Range   Amylase 53 28 - 100 U/L  Lipase, blood     Status: None   Collection Time: 05/29/15  8:55 AM  Result Value Ref Range   Lipase 37 11 - 51 U/L  Blood gas, arterial     Status: Abnormal   Collection Time: 05/29/15  8:57 AM  Result Value Ref Range   FIO2 1.00    Delivery systems NON-REBREATHER OXYGEN MASK    pH, Arterial 7.467 (H) 7.350 - 7.450   pCO2 arterial 41.3 35.0 - 45.0 mmHg   pO2, Arterial 134 (H) 80.0 - 100.0 mmHg   Bicarbonate 29.6 (H) 20.0 - 24.0 mEq/L   TCO2 30.8 0 - 100 mmol/L   Acid-Base Excess 5.7 (H) 0.0 - 2.0 mmol/L   O2 Saturation 96.9 %   Patient temperature 98.1    Collection site LEFT RADIAL    Drawn by [id number]    Sample type ARTERIAL DRAW    Allens test (pass/fail) PASS PASS  Glucose, capillary     Status: Abnormal   Collection Time: 05/29/15 11:44 AM  Result Value Ref Range   Glucose-Capillary 160 (H) 65 - 99 mg/dL   Comment 1 Notify RN    Comment 2 Document in Chart   Potassium  Status: None   Collection Time: 05/29/15 11:45 AM  Result Value Ref Range   Potassium 4.0 3.5 - 5.1 mmol/L  Magnesium     Status: Abnormal   Collection Time: 05/29/15 11:45 AM  Result  Value Ref Range   Magnesium 2.5 (H) 1.7 - 2.4 mg/dL    EKG: A sensed, V paced  IMAGING: Ct Head Wo Contrast  05/28/2015  CLINICAL DATA:  Left lower extremity paralysis, new onset somnolence EXAM: CT HEAD WITHOUT CONTRAST TECHNIQUE: Contiguous axial images were obtained from the base of the skull through the vertex without intravenous contrast. COMPARISON:  07/06/2014 FINDINGS: No evidence of parenchymal hemorrhage or extra-axial fluid collection. No mass lesion, mass effect, or midline shift. No CT evidence of acute infarction. Old left frontal infarct (series 2/ image 21). Subcortical white matter and periventricular small vessel ischemic changes. Intracranial atherosclerosis. Global cortical atrophy.  No ventriculomegaly. The visualized paranasal sinuses are essentially clear. The mastoid air cells are unopacified. No evidence of calvarial fracture. IMPRESSION: No evidence of acute intracranial abnormality. Old left frontal infarct. Atrophy with small vessel ischemic changes. Electronically Signed   By: [name]   On: 05/28/2015 17:29   Dg Chest Port 1 View  05/29/2015  CLINICAL DATA:  Acute respiratory distress EXAM: PORTABLE CHEST 1 VIEW COMPARISON:  05/29/2015 FINDINGS: NG tube stable. Pacemaker and lead from the left subclavian vein stable. Lungs less aerated. Pleural effusions increased. Pulmonary edema is stable on the left and improved on the right. No pneumothorax. IMPRESSION: Bilateral pulmonary edema stable on the left and improved on the right. Worsening low volumes and bilateral pleural effusions. Electronically Signed   By: Marybelle Killings M.D.   On: 05/29/2015 08:54   Dg Chest Port 1 View  05/29/2015  CLINICAL DATA:  Acute onset of hypoxia and tachypnea. Initial encounter. EXAM: PORTABLE CHEST 1 VIEW COMPARISON:  Chest radiograph from 05/28/2015 FINDINGS: The patient's enteric tube is noted extending below the diaphragm. New bilateral airspace opacification is noted,  possibly reflecting pulmonary edema or pneumonia. No pleural effusion or pneumothorax is seen. The cardiomediastinal silhouette is borderline normal in size. The patient is status post median sternotomy, with evidence of prior CABG. A pacemaker is noted overlying the left chest wall, with leads ending overlying the right atrium and right ventricle. No acute osseous abnormalities are seen. Cervical spinal fusion hardware is partially imaged. IMPRESSION: New bilateral airspace opacification, possibly reflecting pulmonary edema or pneumonia. Electronically Signed   By: Garald Balding M.D.   On: 05/29/2015 00:55   Dg Chest Port 1 View  05/28/2015  CLINICAL DATA:  Aspiration pneumonia. EXAM: PORTABLE CHEST 1 VIEW COMPARISON:  05/27/2015 FINDINGS: Sternotomy wires and left-sided pacemaker unchanged. Patient is rotated to the right. Lungs are adequately inflated demonstrate continued mild prominence of the perihilar markings left-greater-than-right likely mild vascular congestion although cannot exclude developing infection in the left mid to lower lung. No evidence of effusion. Mild stable cardiomegaly. Remainder of the exam is unchanged. IMPRESSION: Stable prominence of the perihilar markings left-greater-than-right suggesting mild vascular congestion, although cannot exclude developing infection in the left mid to lower lung. Mild stable cardiomegaly. Electronically Signed   By: Marin Olp M.D.   On: 05/28/2015 12:18   Dg Abd 2 Views  05/28/2015  CLINICAL DATA:  Fevers and vomiting EXAM: ABDOMEN - 2 VIEW COMPARISON:  None. FINDINGS: Scattered large and small bowel gas is noted. No free air is seen. No obstructive changes are noted. The overall appearance suggests an  underlying ileus. Clinical correlation with the physical exam is recommended. No bony abnormality is seen. No abnormal mass or abnormal calcifications are noted. IMPRESSION: Changes suggestive of a generalized ileus. Correlation with the physical  exam is recommended. Electronically Signed   By: Inez Catalina M.D.   On: 05/28/2015 17:41    IMPRESSION: Principal Problem:   FUO (fever of unknown origin) Active Problems:   Aspiration pneumonia ([place])   Staphylococcus aureus bacteremia with sepsis ([place])   Pacemaker infection ([place])   HTN (hypertension)   CAD of vein bypass graft   Chronic systolic heart failure (HCC)   Chronic kidney disease, stage III (moderate)   Pacemaker- St Jude Bic- Jan 2015   Diabetes mellitus type II, uncontrolled ([place])   AKI (acute kidney injury) ([place])   Metabolic encephalopathy   Palliative care encounter   Ileus ([place])   Cardiomyopathy, ischemic-30-35%   Hospice care patient- currently limited code   HYPERCHOLESTEROLEMIA   Iron deficiency anemia   Peripheral neuropathy ([place])   BLINDNESS   Chronic pain syndrome   Thrombocytopenia (HCC)   Pulmonary HTN ([place])   Pressure ulcer   RECOMMENDATION: Will review with MD  Time Spent Directly with Patient: 40 minutes  KILROY,LUKE K [phone] beeper 05/29/2015, 1:59 PM   I have seen and examined the patient along with Erlene Quan, PA.  I have reviewed the chart, notes and new data.  I agree with PA's note.  Key new complaints: he remains stuporous, but understands some things; not speaking Key examination changes: no overt signs of pacemaker pocket infection Key new findings / data: 100% BiV paced  PLAN:  Complex and severe medical illness. Hard to say with current data whether he is currently pacemaker dependent (which would make the situation even more grave and challenging). Will interrogate device. But whether or not he is pacemaker dependent, I strongly doubt that Mr. Willner would survive the dangerous procedure of pacemaker 3-lead extraction and then deal with the worsening HF due to loss of BiV pacing. He would require 3 procedures: pacemaker system extraction, "temporary-permanent" pacemaker and finally implantation of a new permanent  system. When I described this to his wife, she immediately said "no". She agrees that it would be too much for the patient to go through. She is most interested in providing comfort care for Mr. Gaughan and is especially interested in providing that care at home. They had a very positive experience (and an unexpectedly good outcome) with home hospice before. Palliative care (maybe lifelong suppressive antibiotics?) seems to be the best approach.  Sanda Klein, MD, [place] [phone] 05/29/2015, 3:20 PM

## 2015-05-29 NOTE — Progress Notes (Addendum)
ANTIBIOTIC CONSULT NOTE - FOLLOW UP  Pharmacy Consult:  Ancef Indication:  MSSA bacteremia + possible PNA  Allergies  Allergen Reactions  . Sildenafil Nausea Only and Other (See Comments)    "took it once; heart went to pieces; never took it again"  . Percocet [Oxycodone-Acetaminophen] Other (See Comments)    Unknown reaction-possible altered mental state-per wife.    . Contrast Media [Iodinated Diagnostic Agents] Nausea Only    Patient Measurements: Height: 5\' 1"  (154.9 cm) Weight: 183 lb 3.2 oz (83.1 kg) IBW/kg (Calculated) : 52.3  Vital Signs: Temp: 98.1 F (36.7 C) (10/22 0800) Temp Source: Axillary (10/22 0800) BP: 154/81 mmHg (10/22 0818) Pulse Rate: 80 (10/22 0818) Intake/Output from previous day: 10/21 0701 - 10/22 0700 In: 1950 [I.V.:1950] Out: 2806 [Urine:2550; Emesis/NG output:255; Stool:1] Intake/Output from this shift: Total I/O In: -  Out: 50 [Emesis/NG output:50]  Labs:  Recent Labs  05/27/15 0920 05/28/15 0410 05/29/15 0047 05/29/15 0653  WBC 7.5 8.3 7.1  --   HGB 15.2 15.6 17.9*  --   PLT 126* 90* 80*  --   CREATININE 1.27* 1.30*  --  1.48*   Estimated Creatinine Clearance: 41.8 mL/min (by C-G formula based on Cr of 1.48). No results for input(s): VANCOTROUGH, VANCOPEAK, VANCORANDOM, GENTTROUGH, GENTPEAK, GENTRANDOM, TOBRATROUGH, TOBRAPEAK, TOBRARND, AMIKACINPEAK, AMIKACINTROU, AMIKACIN in the last 72 hours.   Microbiology: Recent Results (from the past 720 hour(s))  Blood Culture (routine x 2)     Status: None   Collection Time: 05/27/15  9:20 AM  Result Value Ref Range Status   Specimen Description BLOOD LEFT ANTECUBITAL  Final   Special Requests BOTTLES DRAWN AEROBIC AND ANAEROBIC 5ML  Final   Culture  Setup Time   Final    GRAM POSITIVE COCCI IN CLUSTERS IN BOTH AEROBIC AND ANAEROBIC BOTTLES CRITICAL RESULT CALLED TO, READ BACK BY AND VERIFIED WITH: R TARWATER RN 2250 05/27/15 A BROWNING    Culture STAPHYLOCOCCUS AUREUS  Final   Report Status 05/29/2015 FINAL  Final   Organism ID, Bacteria STAPHYLOCOCCUS AUREUS  Final      Susceptibility   Staphylococcus aureus - MIC*    CIPROFLOXACIN <=0.5 SENSITIVE Sensitive     ERYTHROMYCIN 0.5 SENSITIVE Sensitive     GENTAMICIN <=0.5 SENSITIVE Sensitive     OXACILLIN <=0.25 SENSITIVE Sensitive     TETRACYCLINE <=1 SENSITIVE Sensitive     VANCOMYCIN 1 SENSITIVE Sensitive     TRIMETH/SULFA <=10 SENSITIVE Sensitive     CLINDAMYCIN <=0.25 SENSITIVE Sensitive     RIFAMPIN <=0.5 SENSITIVE Sensitive     Inducible Clindamycin NEGATIVE Sensitive     * STAPHYLOCOCCUS AUREUS  Blood Culture (routine x 2)     Status: None   Collection Time: 05/27/15  9:39 AM  Result Value Ref Range Status   Specimen Description BLOOD RIGHT ANTECUBITAL  Final   Special Requests BOTTLES DRAWN AEROBIC AND ANAEROBIC 5ML  Final   Culture  Setup Time   Final    GRAM POSITIVE COCCI IN CLUSTERS IN BOTH AEROBIC AND ANAEROBIC BOTTLES CRITICAL RESULT CALLED TO, READ BACK BY AND VERIFIED WITH: R TARWATER RN 2250 05/27/15 A BROWNING    Culture   Final    STAPHYLOCOCCUS AUREUS SUSCEPTIBILITIES PERFORMED ON PREVIOUS CULTURE WITHIN THE LAST 5 DAYS.    Report Status 05/29/2015 FINAL  Final  Urine culture     Status: None   Collection Time: 05/27/15  9:52 AM  Result Value Ref Range Status   Specimen Description URINE, CLEAN CATCH  Final   Special Requests NONE  Final   Culture NO GROWTH 1 DAY  Final   Report Status 05/28/2015 FINAL  Final  Respiratory virus panel     Status: None   Collection Time: 05/27/15 12:43 PM  Result Value Ref Range Status   Source - RVPAN NASAL SWAB  Corrected   Respiratory Syncytial Virus A Negative Negative Final   Respiratory Syncytial Virus B Negative Negative Final   Influenza A Negative Negative Final   Influenza B Negative Negative Final   Parainfluenza 1 Negative Negative Final   Parainfluenza 2 Negative Negative Final   Parainfluenza 3 Negative Negative Final    Metapneumovirus Negative Negative Final   Rhinovirus Negative Negative Final   Adenovirus Negative Negative Final    Comment: (NOTE) Performed At: [facility] [address] [place], [place] [id number] Lindon Romp MD [id number]   MRSA PCR Screening     Status: None   Collection Time: 05/27/15  4:25 PM  Result Value Ref Range Status   MRSA by PCR NEGATIVE NEGATIVE Final    Comment:        The GeneXpert MRSA Assay (FDA approved for NASAL specimens only), is one component of a comprehensive MRSA colonization surveillance program. It is not intended to diagnose MRSA infection nor to guide or monitor treatment for MRSA infections.       Assessment: 1 YOM started on vancomycin and Zosyn for possible PNA and GPC bacteremia.  Patient to narrow to Ancef since blood culture grew MSSA.  He has a history of CKD and his SCr is trending up slowly.  Patient however has good urine output.  Zosyn 10/20 >> 10/22 Ancef 10/22 >> Vanc 10/20 >> 10/22  10/20 BCx2 - MSSA 10/20 UCx - negative 10/20 RSV - negative 10/22 BCx x2 -   Goal of Therapy:  Resolution of infection   Plan:  - Ancef 2gm IV Q8H - Monitor renal fxn, repeat blood cultures - F/U resume home meds, especially ASA, Lipitor, Plavix - F/U change IVF and supplement K, hold Lovenox if plts drop any further    Calahan Pak D. Mina Marble, PharmD, BCPS Pager:  [phone] 05/29/2015, 10:40 AM

## 2015-05-30 ENCOUNTER — Inpatient Hospital Stay (HOSPITAL_COMMUNITY): Payer: Commercial Managed Care - HMO

## 2015-05-30 DIAGNOSIS — A419 Sepsis, unspecified organism: Secondary | ICD-10-CM

## 2015-05-30 DIAGNOSIS — Z978 Presence of other specified devices: Secondary | ICD-10-CM

## 2015-05-30 DIAGNOSIS — I25810 Atherosclerosis of coronary artery bypass graft(s) without angina pectoris: Secondary | ICD-10-CM

## 2015-05-30 DIAGNOSIS — T827XXA Infection and inflammatory reaction due to other cardiac and vascular devices, implants and grafts, initial encounter: Principal | ICD-10-CM

## 2015-05-30 DIAGNOSIS — N179 Acute kidney failure, unspecified: Secondary | ICD-10-CM

## 2015-05-30 DIAGNOSIS — I480 Paroxysmal atrial fibrillation: Secondary | ICD-10-CM | POA: Diagnosis present

## 2015-05-30 DIAGNOSIS — R0902 Hypoxemia: Secondary | ICD-10-CM

## 2015-05-30 LAB — CBC WITH DIFFERENTIAL/PLATELET
BASOS ABS: 0 10*3/uL (ref 0.0–0.1)
Basophils Relative: 0 %
EOS PCT: 0 %
Eosinophils Absolute: 0 10*3/uL (ref 0.0–0.7)
HEMATOCRIT: 53 % — AB (ref 39.0–52.0)
HEMOGLOBIN: 17.6 g/dL — AB (ref 13.0–17.0)
Lymphocytes Relative: 12 %
Lymphs Abs: 0.9 10*3/uL (ref 0.7–4.0)
MCH: 31 pg (ref 26.0–34.0)
MCHC: 33.2 g/dL (ref 30.0–36.0)
MCV: 93.3 fL (ref 78.0–100.0)
Monocytes Absolute: 1 10*3/uL (ref 0.1–1.0)
Monocytes Relative: 13 %
NEUTROS ABS: 5.5 10*3/uL (ref 1.7–7.7)
NEUTROS PCT: 75 %
Platelets: 92 10*3/uL — ABNORMAL LOW (ref 150–400)
RBC: 5.68 MIL/uL (ref 4.22–5.81)
RDW: 14.2 % (ref 11.5–15.5)
WBC: 7.4 10*3/uL (ref 4.0–10.5)

## 2015-05-30 LAB — HIV ANTIBODY (ROUTINE TESTING W REFLEX): HIV SCREEN 4TH GENERATION: NONREACTIVE

## 2015-05-30 LAB — GLUCOSE, CAPILLARY
GLUCOSE-CAPILLARY: 121 mg/dL — AB (ref 65–99)
GLUCOSE-CAPILLARY: 143 mg/dL — AB (ref 65–99)
Glucose-Capillary: 153 mg/dL — ABNORMAL HIGH (ref 65–99)
Glucose-Capillary: 147 mg/dL — ABNORMAL HIGH (ref 65–99)
Glucose-Capillary: 166 mg/dL — ABNORMAL HIGH (ref 65–99)
Glucose-Capillary: 146 mg/dL — ABNORMAL HIGH (ref 65–99)
Glucose-Capillary: 196 mg/dL — ABNORMAL HIGH (ref 65–99)

## 2015-05-30 LAB — COMPREHENSIVE METABOLIC PANEL
ALK PHOS: 45 U/L (ref 38–126)
ALT: 45 U/L (ref 17–63)
AST: 54 U/L — AB (ref 15–41)
Albumin: 2.6 g/dL — ABNORMAL LOW (ref 3.5–5.0)
Anion gap: 12 (ref 5–15)
BUN: 32 mg/dL — AB (ref 6–20)
CALCIUM: 8.6 mg/dL — AB (ref 8.9–10.3)
CHLORIDE: 107 mmol/L (ref 101–111)
CO2: 30 mmol/L (ref 22–32)
CREATININE: 1.2 mg/dL (ref 0.61–1.24)
GFR, EST NON AFRICAN AMERICAN: 59 mL/min — AB (ref >60)
Glucose, Bld: 163 mg/dL — ABNORMAL HIGH (ref 65–99)
Potassium: 3.4 mmol/L — ABNORMAL LOW (ref 3.5–5.1)
Sodium: 149 mmol/L — ABNORMAL HIGH (ref 135–145)
Total Bilirubin: 1.2 mg/dL (ref 0.3–1.2)
Total Protein: 6.6 g/dL (ref 6.5–8.1)

## 2015-05-30 LAB — MAGNESIUM: Magnesium: 2.6 mg/dL — ABNORMAL HIGH (ref 1.7–2.4)

## 2015-05-30 MED ORDER — HYDRALAZINE HCL 20 MG/ML IJ SOLN
5.0000 mg | INTRAMUSCULAR | Status: DC | PRN
  Administered 2015-05-31 – 2015-06-01 (×2): 5 mg via INTRAVENOUS
  Filled 2015-05-30 (×2): qty 1

## 2015-05-30 MED ORDER — POTASSIUM CL IN DEXTROSE 5% 20 MEQ/L IV SOLN
20.0000 meq | INTRAVENOUS | Status: DC
  Administered 2015-05-31: 20 meq via INTRAVENOUS
  Filled 2015-05-30 (×2): qty 1000

## 2015-05-30 MED ORDER — POTASSIUM CHLORIDE 10 MEQ/100ML IV SOLN
10.0000 meq | INTRAVENOUS | Status: AC
  Administered 2015-05-30 (×4): 10 meq via INTRAVENOUS
  Filled 2015-05-30 (×4): qty 100

## 2015-05-30 NOTE — Progress Notes (Signed)
[facility] TEAM 1 - Stepdown/ICU TEAM Progress Note  Mark French NLG:[id number] DOB: [birth year] DOA: 05/27/2015 PCP: [name], MD  Admit HPI / Brief Narrative: 69 year old WM PMHx DM Type 2 controlled with SSI and diet, Chronic combined Systolic and Diastolic CHF, severe Pulmonary HTN, CAD S/P CABG, HTN, Complete Heart Block requiring pacemaker placement, HLD, procedure, hypertension, legally blind from diabetic retinopathy, severe peripheral neuropathy, compression fracture low back with chronic pain, childhood stroke 1945, chronic stasis dermatitis lower extremities, CKD stage III, Duodenal Ulcer.  Patient was sent to the ER by his wife via EMS after developing fevers that began yesterday afternoon. Initial fever was 102.3. No apparent associated symptoms such as coughing, nausea, vomiting or diarrhea. Wife reports that as a treat she bought the patient a fast food sandwich shortly after eating a sandwich patient developed fever. Despite utilizing Tylenol patient's fever persisted during the night. It was also noted that with the onset of fever patient had altered mentation and really has not verbally communicated with the wife since onset of fever. He does not appear to have any focal neurological deficits other than not talking. He did receive influenza vaccine.  In the ER the patient had a rectal temperature of 102.6 which is decreased and 98.6 after IV fluids and rectal Tylenol. He is normotensive with a blood pressure 138/62 and MAP of 72. Pulse is regular in the 80s and ventricular paced, respiratory rate between 19 and 24. Patient was empirically placed on 2 L nasal cannula oxygen saturating 100%. Portable chest x-ray was read as mild CHF but has more of an appearance of pneumonitis. Patient was mildly hypokalemic with potassium of 3.3, he also had mild acute renal failure with creatinine of 1.27 and a GFR of 55 with baseline creatinine 1.18. Lactic acid was mildly elevated at 2.1 with  repeat lactic acid several hours later 2.88. In addition to the Tylenol patient has received a total of 1 L of IV fluids in the ER because of elevated lactic acid and fever of unknown origin and he was given empiric Zosyn and vancomycin and blood cultures and urine cultures were obtained. Urinalysis did not appear consistent with a UTI. EKG revealed sinus rhythm with first AV block with prolonged QTC likely from underlying left bundle branch block  Shortly after I evaluated the patient and and prior to my attending evaluating the patient, the patient apparently had an witnessed episode of emesis and when attended to by nursing staff was noted to have increased respiratory rate and decreased O2 saturation of 88% where he had previously been maintaining O2 saturations between 98 and 100% on 2 L nasal cannula.   HPI/Subjective: 10/23 A/O 0,  Eyes are open patient looks around the room, moaning, does not follow commands or answer questions. Wife states prior to hospitalization  able to complete ADLs  Assessment/Plan: Positive MSSA Staph bacteremia/CAP -Blood cultures 2/2 positive MSSA staph aureus  -CXR; LLL pneumonia? -DC'd broad-spectrum ABx and started ceftriaxone per ID (see below) -Continue emesis so continue IV Reglan and low-dose IV Phenergan (no Zofran in setting of prolonged QT interval) -Patient will most likely require TEE when more stable -Cardiology consult; removal of Pacemaker impractical  Acute respiratory failure with hypoxemia/likely aspiration pneumonia -Continue supportive care with oxygen-due to recent emesis and altered mentation not an appropriate candidate for BiPAP at this juncture -Titrate O2 to maintain SPO2> 93%  Bilateral pleural effusion -10/22 PCXR shows worsening pulmonary edema and bilateral pleural effusion  Metabolic encephalopathy -Suspect  related to fever and underlying infectious processes -NPO until more alert  AKI ([place]) on Chronic kidney disease, stage  III (moderate) -Cr responded to hydration.  Generalized ileus/Ogilvie syndrome? -Most likely multifactorial to include chronic high-dose narcotic use, MSSA Staph bacteremia  -Continue NG tube; low continuous suction -NOTE; patient seen in the past by Dr. Delfin Edis ([facility]) for chronic abdominal pain. Last seen 05/19/2014. Per her note patient has never had colonoscopy. -GI consulted; recommend continuing NG tube and rectal tube  Thrombocytopenia (HCC) -New thrombocytopenia with platelets< 100,000, trending up -Suspect related to underlying infectious process and acute inflammation  Hypokalemia -Potassium goal> 4 - Potassium IV 71meq  HTN (hypertension) -Relatively hypotensive hold BP medication  -Hydralazine IV 5 mg PRN SBP> 160 or DBP> 100  CAD of autologous vein bypass graft -Patient somnolent/obtunded; troponin trending up cardiology consulted -Preadmission statin, Plavix, Imdur as well as carvedilol on hold while NPO  Chronic systolic and diastolic heart failure [facility]) -Patient's cardiologist is Dr.Michael Burt Knack. Last seen 12/29/2014 -Ejection fraction 40%  last echocardiogram March 2015 revealed severe hypokinesis of the inferior wall with left ventricular dilatation -Echocardiogram 10/21; slightly worsening EF see results below -Strict in and out since admission -873ml -Daily a.m. Weight; 10/23 bed weight= 83.6 kg   Pacemaker -Currently actively pacing -MSSA staph bacteremia pacer will need to be removed if possible; removal of pacer and wire impractical per cardiology  Pulmonary HTN (HCC)/COPD -Severe with most recent reading of 83 mmHg in 2015 -Imdur on hold  Chronic pain syndrome -due to ongoing peripheral neuropathy and apparent lumbar compression fracture -On methadone, Norco, morphine sulfate at home but currently holding all sedative medications due to acute metabolic encephalopathy  Diabetes mellitus type II, controlled ([place]) -Current CBGs greater than  200 and likely related to acute infectious processes -10/20 Hemoglobin A1c= 6.6 -Sensitive SSI   HYPERCHOLESTEROLEMIA -Statin on hold  Iron deficiency anemia -Current hemoglobin 15.2 with baseline hemoglobin around 13 consistent with hemoconcentration and volume depletion  Peripheral neuropathy (HCC) -Preadmission narcotics and gabapentin on hold  BLINDNESS -Secondary to diabetic retinopathy  DVT  -Patient with palpable cords left calf, bilateral poor circulation -lower extremity venous Doppler; negative for DVT/SVT  Goals of care -Consult since and palliative care in agreement that further treatment would be futile and just prolong suffering, looking into hospice. See notes    Code Status: FULL Family Communication: Wife present at time of exam Disposition Plan: Resolution sepsis    Consultants: Dr. Scarlette Shorts ([facility]) Dr. Sanda Klein (cardiology) Dr. Dietrich [place] (ID)     Procedure/Significant Events: 10/27/2013 echocardiogram;- LVEF= Inferior wall severely hypokinetic; moderately dilated.  -LVEF= 40%.- Left atrium: moderately dilated. - Pulmonary arteries: PA peak pressure: 46mm Hg (S). 10/21 echocardiogram; LVEF= 30% - 35%. Diffuse hypokinesis.-(grade 1 diastolic dysfunction).      Culture 10/20 blood left/right AC positive MSSA staph 10/20 MRSA by PCR negative 10/20 influenza A/B/H1N1 negative 10/20 Respiratory virus panel negative 10/22 bilateral venous duplex ultrasounds;no obvious evidence of DVT or SVT noted in the visualized veins of the bilateral lower extremities.    Antibiotics: Zosyn 10/20>> stopped 10/22 Vancomycin 10/20>> stopped 10/22 Ceftriaxone 10/22>>   DVT prophylaxis: Lovenox   Devices    LINES / TUBES:      Continuous Infusions: . dextrose 5 % with KCl 20 mEq / L 20 mEq (05/30/15 1200)    Objective: VITAL SIGNS: Temp: 99.5 F (37.5 C) (10/23 1130) Temp Source: Axillary (10/23 1130) BP: 159/75 mmHg (10/23  1131)  Pulse Rate: 70 (10/23 1131) SPO2; FIO2:   Intake/Output Summary (Last 24 hours) at 05/30/15 1406 Last data filed at 05/30/15 1200  Gross per 24 hour  Intake    190 ml  Output   2125 ml  Net  -1935 ml     Exam: General: A/O 0,  Eyes are open patient looks around the room, moaning, does not follow commands or answer questions,  No acute respiratory distress Eyes: negative scleral hemorrhage ENT: Negative Runny nose, gingival bleeding, Neck:  Negative scars, masses, torticollis, lymphadenopathy, JVD Lungs: Clear to auscultation bilaterally without wheezes or crackles Cardiovascular: Regular rate and rhythm without murmur gallop or rub normal S1 and S2 Abdomen:negative abdominal pain, positive distention, positive soft, bowel sounds, no rebound, no ascites, no appreciable mass Extremities: positive bilateral significant cyanosis Lt>>Rt , multiple left metatarsals gangrenous ulcers. Psychiatric:  Unable to assess  Neurologic:  Withdraws R LE/RUE/LUE to painful stimuli.    Data Reviewed: Basic Metabolic Panel:  Recent Labs Lab 05/27/15 0920 05/27/15 1650 05/28/15 0410 05/29/15 0653 05/29/15 1145 05/30/15 0420  NA 138  --  143 149*  --  149*  K 3.3*  --  4.0 2.8* 4.0 3.4*  CL 99*  --  105 103  --  107  CO2 26  --  24 29  --  30  GLUCOSE 238*  --  153* 200*  --  163*  BUN 19  --  23* 20  --  32*  CREATININE 1.27*  --  1.30* 1.48*  --  1.20  CALCIUM 9.1  --  8.4* 8.6*  --  8.6*  MG  --  2.1  --  2.3 2.5* 2.6*  PHOS  --  3.4  --   --   --   --    Liver Function Tests:  Recent Labs Lab 05/27/15 0920 05/28/15 0410 05/29/15 0653 05/30/15 0420  AST 26 62* 70* 54*  ALT 23 49 59 45  ALKPHOS 69 55 52 45  BILITOT 0.8 0.7 1.0 1.2  PROT 7.3 6.1* 6.5 6.6  ALBUMIN 3.9 2.9* 2.6* 2.6*    Recent Labs Lab 05/29/15 0855  LIPASE 37  AMYLASE 53   No results for input(s): AMMONIA in the last 168 hours. CBC:  Recent Labs Lab 05/27/15 0920 05/28/15 0410  05/29/15 0047 05/30/15 0420  WBC 7.5 8.3 7.1 7.4  NEUTROABS 6.7  --  5.8 5.5  HGB 15.2 15.6 17.9* 17.6*  HCT 45.8 47.6 53.3* 53.0*  MCV 92.0 93.0 92.1 93.3  PLT 126* 90* 80* 92*   Cardiac Enzymes:  Recent Labs Lab 05/28/15 1953 05/29/15 0653  TROPONINI 0.29* 1.13*   BNP (last 3 results) No results for input(s): BNP in the last 8760 hours.  ProBNP (last 3 results)  Recent Labs  08/25/14 1041  PROBNP 179.0*    CBG:  Recent Labs Lab 05/29/15 1956 05/29/15 2316 05/30/15 0423 05/30/15 0819 05/30/15 1131  GLUCAP 140* 121* 143* 153* 147*    Recent Results (from the past 240 hour(s))  Blood Culture (routine x 2)     Status: None   Collection Time: 05/27/15  9:20 AM  Result Value Ref Range Status   Specimen Description BLOOD LEFT ANTECUBITAL  Final   Special Requests BOTTLES DRAWN AEROBIC AND ANAEROBIC 5ML  Final   Culture  Setup Time   Final    GRAM POSITIVE COCCI IN CLUSTERS IN BOTH AEROBIC AND ANAEROBIC BOTTLES CRITICAL RESULT CALLED TO, READ BACK BY AND VERIFIED WITH: R  TARWATER RN 2250 05/27/15 A BROWNING    Culture STAPHYLOCOCCUS AUREUS  Final   Report Status 05/29/2015 FINAL  Final   Organism ID, Bacteria STAPHYLOCOCCUS AUREUS  Final      Susceptibility   Staphylococcus aureus - MIC*    CIPROFLOXACIN <=0.5 SENSITIVE Sensitive     ERYTHROMYCIN 0.5 SENSITIVE Sensitive     GENTAMICIN <=0.5 SENSITIVE Sensitive     OXACILLIN <=0.25 SENSITIVE Sensitive     TETRACYCLINE <=1 SENSITIVE Sensitive     VANCOMYCIN 1 SENSITIVE Sensitive     TRIMETH/SULFA <=10 SENSITIVE Sensitive     CLINDAMYCIN <=0.25 SENSITIVE Sensitive     RIFAMPIN <=0.5 SENSITIVE Sensitive     Inducible Clindamycin NEGATIVE Sensitive     * STAPHYLOCOCCUS AUREUS  Blood Culture (routine x 2)     Status: None   Collection Time: 05/27/15  9:39 AM  Result Value Ref Range Status   Specimen Description BLOOD RIGHT ANTECUBITAL  Final   Special Requests BOTTLES DRAWN AEROBIC AND ANAEROBIC 5ML  Final    Culture  Setup Time   Final    GRAM POSITIVE COCCI IN CLUSTERS IN BOTH AEROBIC AND ANAEROBIC BOTTLES CRITICAL RESULT CALLED TO, READ BACK BY AND VERIFIED WITH: R TARWATER RN 2250 05/27/15 A BROWNING    Culture   Final    STAPHYLOCOCCUS AUREUS SUSCEPTIBILITIES PERFORMED ON PREVIOUS CULTURE WITHIN THE LAST 5 DAYS.    Report Status 05/29/2015 FINAL  Final  Urine culture     Status: None   Collection Time: 05/27/15  9:52 AM  Result Value Ref Range Status   Specimen Description URINE, CLEAN CATCH  Final   Special Requests NONE  Final   Culture NO GROWTH 1 DAY  Final   Report Status 05/28/2015 FINAL  Final  Respiratory virus panel     Status: None   Collection Time: 05/27/15 12:43 PM  Result Value Ref Range Status   Source - RVPAN NASAL SWAB  Corrected   Respiratory Syncytial Virus A Negative Negative Final   Respiratory Syncytial Virus B Negative Negative Final   Influenza A Negative Negative Final   Influenza B Negative Negative Final   Parainfluenza 1 Negative Negative Final   Parainfluenza 2 Negative Negative Final   Parainfluenza 3 Negative Negative Final   Metapneumovirus Negative Negative Final   Rhinovirus Negative Negative Final   Adenovirus Negative Negative Final    Comment: (NOTE) Performed At: [facility] [address] [place], [place] [id number] Lindon Romp MD [id number]   MRSA PCR Screening     Status: None   Collection Time: 05/27/15  4:25 PM  Result Value Ref Range Status   MRSA by PCR NEGATIVE NEGATIVE Final    Comment:        The GeneXpert MRSA Assay (FDA approved for NASAL specimens only), is one component of a comprehensive MRSA colonization surveillance program. It is not intended to diagnose MRSA infection nor to guide or monitor treatment for MRSA infections.   Culture, blood (routine x 2)     Status: None (Preliminary result)   Collection Time: 05/29/15 11:45 AM  Result Value Ref Range Status   Specimen Description BLOOD  RIGHT ANTECUBITAL  Final   Special Requests BOTTLES DRAWN AEROBIC AND ANAEROBIC 10CC  Final   Culture NO GROWTH < 24 HOURS  Final   Report Status PENDING  Incomplete  Culture, blood (routine x 2)     Status: None (Preliminary result)   Collection Time: 05/29/15 12:00 PM  Result Value  Ref Range Status   Specimen Description BLOOD LEFT ANTECUBITAL  Final   Special Requests BOTTLES DRAWN AEROBIC AND ANAEROBIC 10CC  Final   Culture NO GROWTH < 24 HOURS  Final   Report Status PENDING  Incomplete     Studies:  Recent x-ray studies have been reviewed in detail by the Attending Physician  Scheduled Meds:  Scheduled Meds: . cefTRIAXone (ROCEPHIN)  IV  2 g Intravenous Q24H  . enoxaparin (LOVENOX) injection  40 mg Subcutaneous Q24H  . insulin aspart  0-9 Units Subcutaneous 6 times per day  . metoCLOPramide (REGLAN) injection  5 mg Intravenous 4 times per day    Time spent on care of this patient: 40 mins   WOODS, Geraldo Docker , MD  [facility] Office  [phone] Pager - [phone]  On-Call/Text Page:      [email or web address]      password [email or web address]  If 7PM-7AM, please contact night-coverage [email or web address] Password [email or web address] 05/30/2015, 2:06 PM   LOS: 3 days   Care during the described time interval was provided by me .  I have reviewed this patient's available data, including medical history, events of note, physical examination, and all test results as part of my evaluation. I have personally reviewed and interpreted all radiology studies.   Dia Crawford, MD [phone] Pager

## 2015-05-30 NOTE — Progress Notes (Signed)
[facility] for Infectious Disease    Subjective: Pt with NG tube   Antibiotics:  Anti-infectives    Start     Dose/Rate Route Frequency Ordered Stop   05/29/15 1200  ceFAZolin (ANCEF) IVPB 2 g/50 mL premix  Status:  Discontinued     2 g 100 mL/hr over 30 Minutes Intravenous 3 times per day 05/29/15 1048 05/29/15 1052   05/29/15 1100  cefTRIAXone (ROCEPHIN) 2 g in dextrose 5 % 50 mL IVPB     2 g 100 mL/hr over 30 Minutes Intravenous Every 24 hours 05/29/15 1052     05/27/15 2200  vancomycin (VANCOCIN) IVPB 750 mg/150 ml premix  Status:  Discontinued     750 mg 150 mL/hr over 60 Minutes Intravenous Every 12 hours 05/27/15 1426 05/29/15 1022   05/27/15 1600  piperacillin-tazobactam (ZOSYN) IVPB 3.375 g  Status:  Discontinued     3.375 g 12.5 mL/hr over 240 Minutes Intravenous Every 8 hours 05/27/15 1427 05/29/15 1022   05/27/15 0930  piperacillin-tazobactam (ZOSYN) IVPB 3.375 g     3.375 g 100 mL/hr over 30 Minutes Intravenous  Once 05/27/15 0923 05/27/15 1022   05/27/15 0930  vancomycin (VANCOCIN) IVPB 1000 mg/200 mL premix     1,000 mg 200 mL/hr over 60 Minutes Intravenous  Once 05/27/15 0923 05/27/15 1052      Medications: Scheduled Meds: . cefTRIAXone (ROCEPHIN)  IV  2 g Intravenous Q24H  . enoxaparin (LOVENOX) injection  40 mg Subcutaneous Q24H  . insulin aspart  0-9 Units Subcutaneous 6 times per day  . metoCLOPramide (REGLAN) injection  5 mg Intravenous 4 times per day  . potassium chloride  10 mEq Intravenous Q1 Hr x 4   Continuous Infusions: . dextrose 5 % with KCl 20 mEq / L 20 mEq (05/30/15 1500)   PRN Meds:.acetaminophen **OR** acetaminophen, morphine injection, promethazine    Objective: Weight change: -6 lb 9.8 oz (-3 kg)  Intake/Output Summary (Last 24 hours) at 05/30/15 1545 Last data filed at 05/30/15 1531  Gross per 24 hour  Intake    240 ml  Output   2475 ml  Net  -2235 ml   Blood pressure 159/75, pulse 70, temperature  99.6 F (37.6 C), temperature source Axillary, resp. rate 31, height 5\' 1"  (1.549 m), weight 184 lb 4.9 oz (83.6 kg), SpO2 99 %. Temp:  [97.9 F (36.6 C)-99.6 F (37.6 C)] 99.6 F (37.6 C) (10/23 1524) Pulse Rate:  [69-92] 70 (10/23 1131) Resp:  [26-31] 31 (10/23 1131) BP: (145-176)/(69-85) 159/75 mmHg (10/23 1131) SpO2:  [97 %-100 %] 99 % (10/23 1131) Weight:  [184 lb 4.9 oz (83.6 kg)] 184 lb 4.9 oz (83.6 kg) (10/23 0425)  Physical Exam: General: Fairly somnolent but arousable with NG tube in place HEENT: anicteric sclera, EOMI, oropharynx clear and without exudate Cardiovascular: regular rate, normal r, no murmur rubs or gallops Pulmonary: Rhonchorous airway sounds throughout Gastrointestinal: soft distended and mildly diffusely tender, Musculoskeletal:Skin, soft tissue: no rashes  28/36/62: ? Embolic lesions on toes     Neuro: nonfocal, strength and sensation intact CBC:  CBC Latest Ref Rng 05/30/2015 05/29/2015 05/28/2015  WBC 4.0 - 10.5 K/uL 7.4 7.1 8.3  Hemoglobin 13.0 - 17.0 g/dL 17.6(H) 17.9(H) 15.6  Hematocrit 39.0 - 52.0 % 53.0(H) 53.3(H) 47.6  Platelets 150 - 400 K/uL 92(L) 80(L) 90(L)       BMET  Recent Labs  05/29/15 0653 05/29/15 1145  05/30/15 0420  NA 149*  --  149*  K 2.8* 4.0 3.4*  CL 103  --  107  CO2 29  --  30  GLUCOSE 200*  --  163*  BUN 20  --  32*  CREATININE 1.48*  --  1.20  CALCIUM 8.6*  --  8.6*     Liver Panel   Recent Labs  05/29/15 0653 05/30/15 0420  PROT 6.5 6.6  ALBUMIN 2.6* 2.6*  AST 70* 54*  ALT 59 45  ALKPHOS 52 45  BILITOT 1.0 1.2       Sedimentation Rate No results for input(s): ESRSEDRATE in the last 72 hours. C-Reactive Protein No results for input(s): CRP in the last 72 hours.  Micro Results: Recent Results (from the past 720 hour(s))  Blood Culture (routine x 2)     Status: None   Collection Time: 05/27/15  9:20 AM  Result Value Ref Range Status   Specimen Description BLOOD LEFT ANTECUBITAL   Final   Special Requests BOTTLES DRAWN AEROBIC AND ANAEROBIC 5ML  Final   Culture  Setup Time   Final    GRAM POSITIVE COCCI IN CLUSTERS IN BOTH AEROBIC AND ANAEROBIC BOTTLES CRITICAL RESULT CALLED TO, READ BACK BY AND VERIFIED WITH: R TARWATER RN 2250 05/27/15 A BROWNING    Culture STAPHYLOCOCCUS AUREUS  Final   Report Status 05/29/2015 FINAL  Final   Organism ID, Bacteria STAPHYLOCOCCUS AUREUS  Final      Susceptibility   Staphylococcus aureus - MIC*    CIPROFLOXACIN <=0.5 SENSITIVE Sensitive     ERYTHROMYCIN 0.5 SENSITIVE Sensitive     GENTAMICIN <=0.5 SENSITIVE Sensitive     OXACILLIN <=0.25 SENSITIVE Sensitive     TETRACYCLINE <=1 SENSITIVE Sensitive     VANCOMYCIN 1 SENSITIVE Sensitive     TRIMETH/SULFA <=10 SENSITIVE Sensitive     CLINDAMYCIN <=0.25 SENSITIVE Sensitive     RIFAMPIN <=0.5 SENSITIVE Sensitive     Inducible Clindamycin NEGATIVE Sensitive     * STAPHYLOCOCCUS AUREUS  Blood Culture (routine x 2)     Status: None   Collection Time: 05/27/15  9:39 AM  Result Value Ref Range Status   Specimen Description BLOOD RIGHT ANTECUBITAL  Final   Special Requests BOTTLES DRAWN AEROBIC AND ANAEROBIC 5ML  Final   Culture  Setup Time   Final    GRAM POSITIVE COCCI IN CLUSTERS IN BOTH AEROBIC AND ANAEROBIC BOTTLES CRITICAL RESULT CALLED TO, READ BACK BY AND VERIFIED WITH: R TARWATER RN 2250 05/27/15 A BROWNING    Culture   Final    STAPHYLOCOCCUS AUREUS SUSCEPTIBILITIES PERFORMED ON PREVIOUS CULTURE WITHIN THE LAST 5 DAYS.    Report Status 05/29/2015 FINAL  Final  Urine culture     Status: None   Collection Time: 05/27/15  9:52 AM  Result Value Ref Range Status   Specimen Description URINE, CLEAN CATCH  Final   Special Requests NONE  Final   Culture NO GROWTH 1 DAY  Final   Report Status 05/28/2015 FINAL  Final  Respiratory virus panel     Status: None   Collection Time: 05/27/15 12:43 PM  Result Value Ref Range Status   Source - RVPAN NASAL SWAB  Corrected    Respiratory Syncytial Virus A Negative Negative Final   Respiratory Syncytial Virus B Negative Negative Final   Influenza A Negative Negative Final   Influenza B Negative Negative Final   Parainfluenza 1 Negative Negative Final   Parainfluenza 2 Negative Negative Final  Parainfluenza 3 Negative Negative Final   Metapneumovirus Negative Negative Final   Rhinovirus Negative Negative Final   Adenovirus Negative Negative Final    Comment: (NOTE) Performed At: [facility] [place], [place] [id number] Lindon Romp MD [id number]   MRSA PCR Screening     Status: None   Collection Time: 05/27/15  4:25 PM  Result Value Ref Range Status   MRSA by PCR NEGATIVE NEGATIVE Final    Comment:        The GeneXpert MRSA Assay (FDA approved for NASAL specimens only), is one component of a comprehensive MRSA colonization surveillance program. It is not intended to diagnose MRSA infection nor to guide or monitor treatment for MRSA infections.   Culture, blood (routine x 2)     Status: None (Preliminary result)   Collection Time: 05/29/15 11:45 AM  Result Value Ref Range Status   Specimen Description BLOOD RIGHT ANTECUBITAL  Final   Special Requests BOTTLES DRAWN AEROBIC AND ANAEROBIC 10CC  Final   Culture NO GROWTH < 24 HOURS  Final   Report Status PENDING  Incomplete  Culture, blood (routine x 2)     Status: None (Preliminary result)   Collection Time: 05/29/15 12:00 PM  Result Value Ref Range Status   Specimen Description BLOOD LEFT ANTECUBITAL  Final   Special Requests BOTTLES DRAWN AEROBIC AND ANAEROBIC 10CC  Final   Culture NO GROWTH < 24 HOURS  Final   Report Status PENDING  Incomplete    Studies/Results: Ct Head Wo Contrast  05/28/2015  CLINICAL DATA:  Left lower extremity paralysis, new onset somnolence EXAM: CT HEAD WITHOUT CONTRAST TECHNIQUE: Contiguous axial images were obtained from the base of the skull through the vertex without intravenous  contrast. COMPARISON:  07/06/2014 FINDINGS: No evidence of parenchymal hemorrhage or extra-axial fluid collection. No mass lesion, mass effect, or midline shift. No CT evidence of acute infarction. Old left frontal infarct (series 2/ image 21). Subcortical white matter and periventricular small vessel ischemic changes. Intracranial atherosclerosis. Global cortical atrophy.  No ventriculomegaly. The visualized paranasal sinuses are essentially clear. The mastoid air cells are unopacified. No evidence of calvarial fracture. IMPRESSION: No evidence of acute intracranial abnormality. Old left frontal infarct. Atrophy with small vessel ischemic changes. Electronically Signed   By: [name]   On: 05/28/2015 17:29   Dg Chest Port 1 View  05/29/2015  CLINICAL DATA:  Acute respiratory distress EXAM: PORTABLE CHEST 1 VIEW COMPARISON:  05/29/2015 FINDINGS: NG tube stable. Pacemaker and lead from the left subclavian vein stable. Lungs less aerated. Pleural effusions increased. Pulmonary edema is stable on the left and improved on the right. No pneumothorax. IMPRESSION: Bilateral pulmonary edema stable on the left and improved on the right. Worsening low volumes and bilateral pleural effusions. Electronically Signed   By: Marybelle Killings M.D.   On: 05/29/2015 08:54   Dg Chest Port 1 View  05/29/2015  CLINICAL DATA:  Acute onset of hypoxia and tachypnea. Initial encounter. EXAM: PORTABLE CHEST 1 VIEW COMPARISON:  Chest radiograph from 05/28/2015 FINDINGS: The patient's enteric tube is noted extending below the diaphragm. New bilateral airspace opacification is noted, possibly reflecting pulmonary edema or pneumonia. No pleural effusion or pneumothorax is seen. The cardiomediastinal silhouette is borderline normal in size. The patient is status post median sternotomy, with evidence of prior CABG. A pacemaker is noted overlying the left chest wall, with leads ending overlying the right atrium and right ventricle.  No acute osseous abnormalities are  seen. Cervical spinal fusion hardware is partially imaged. IMPRESSION: New bilateral airspace opacification, possibly reflecting pulmonary edema or pneumonia. Electronically Signed   By: Garald Balding M.D.   On: 05/29/2015 00:55   Dg Abd 2 Views  05/30/2015  CLINICAL DATA:  Ileus EXAM: ABDOMEN - 2 VIEW COMPARISON:  05/28/2015 FINDINGS: Enteric tube terminates in the distal second portion of the duodenum. Nonobstructive bowel gas pattern. No evidence of free air on the lateral decubitus view. Degenerative changes of the lumbar spine. IMPRESSION: Enteric tube terminates in the distal second portion the duodenum. No evidence of small bowel obstruction or free air. Electronically Signed   By: [name]   On: 05/30/2015 10:02   Dg Abd 2 Views  05/28/2015  CLINICAL DATA:  Fevers and vomiting EXAM: ABDOMEN - 2 VIEW COMPARISON:  None. FINDINGS: Scattered large and small bowel gas is noted. No free air is seen. No obstructive changes are noted. The overall appearance suggests an underlying ileus. Clinical correlation with the physical exam is recommended. No bony abnormality is seen. No abnormal mass or abnormal calcifications are noted. IMPRESSION: Changes suggestive of a generalized ileus. Correlation with the physical exam is recommended. Electronically Signed   By: Inez Catalina M.D.   On: 05/28/2015 17:41      Assessment/Plan:  INTERVAL HISTORY:  05/30/15: pt and wife moving more towards palliative approach,    Principal Problem:   FUO (fever of unknown origin) Active Problems:   HYPERCHOLESTEROLEMIA   Iron deficiency anemia   Peripheral neuropathy (HCC)   BLINDNESS   HTN (hypertension)   CAD of vein bypass graft   Chronic systolic heart failure (HCC)   Chronic kidney disease, stage III (moderate)   Pacemaker- [employer] Bic- Jan 2015   Chronic pain syndrome   Diabetes mellitus type II, uncontrolled (HCC)   AKI (acute kidney injury) ([place])    Thrombocytopenia ([place])   Metabolic encephalopathy   Pulmonary HTN ([place])   Palliative care encounter   Aspiration pneumonia ([place])   Pressure ulcer   Ileus ([place])   Staphylococcus aureus bacteremia with sepsis ([place])   Pacemaker infection ([place])   Cardiomyopathy, ischemic-30-35%   Hospice care patient- currently limited code   Adynamic ileus ([place])   Abnormal x-ray of abdomen   Hypokalemia   CHB (complete heart block) ([place])   Acute respiratory distress ([place])   Controlled type 2 diabetes mellitus with complication, without long-term current use of insulin (HCC)   Paroxysmal atrial fibrillation (HCC)    Mark French is a 69 y.o. 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 Audiologist and the patient and pacemaker extraction is not a realistic possibility would likely but the patient in immediate harm rather than make a meaningful difference in his outcome. Patient is moving towards more of the positive care approach.  I had extensive discussion with his wife today. I would continue him with IV antibiotics wise the hospital and then change and more over to oral augmentin 875/125 BID indefinitely at DC (if he survives) vs pursue approach without abx  I will sign off for now  Please call with further questions.  I spent greater than 40 minutes with the patient including greater than 50% of time in face to face counsel of the patient and wife re hsi PM infection, likely endocarditis, sepsis due to MSSA and goals of care  and in coordination of their care.    LOS: 3 days   Alcide Evener 05/30/2015,  3:45 PM

## 2015-05-30 NOTE — Progress Notes (Signed)
UR COMPLETED  

## 2015-05-30 NOTE — Progress Notes (Addendum)
Subjective:  A little more awake this am- follows me with his eyes but does not communicate or follow commands.  Objective:  Vital Signs in the last 24 hours: Temp:  [97.9 F (36.6 C)-98.9 F (37.2 C)] 98.7 F (37.1 C) (10/23 0815) Pulse Rate:  [69-92] 69 (10/23 0900) Resp:  [26-31] 28 (10/23 0900) BP: (131-176)/(67-85) 159/69 mmHg (10/23 0815) SpO2:  [97 %-100 %] 100 % (10/23 0900) Weight:  [184 lb 4.9 oz (83.6 kg)] 184 lb 4.9 oz (83.6 kg) (10/23 0425)  Intake/Output from previous day:  Intake/Output Summary (Last 24 hours) at 05/30/15 1012 Last data filed at 05/30/15 1000  Gross per 24 hour  Intake    210 ml  Output   2125 ml  Net  -1915 ml    Physical Exam: General appearance: moderately obese and slowed mentation Lungs: diffuse rhonchi Heart: regular rate and rhythm Abdomen: BS quiet, not distended, small umbilical hernia noted   Rate: 70  Rhythm: paced  Lab Results:  Recent Labs  05/29/15 0047 05/30/15 0420  WBC 7.1 7.4  HGB 17.9* 17.6*  PLT 80* 92*    Recent Labs  05/29/15 0653 05/29/15 1145 05/30/15 0420  NA 149*  --  149*  K 2.8* 4.0 3.4*  CL 103  --  107  CO2 29  --  30  GLUCOSE 200*  --  163*  BUN 20  --  32*  CREATININE 1.48*  --  1.20    Recent Labs  05/28/15 1953 05/29/15 0653  TROPONINI 0.29* 1.13*   No results for input(s): INR in the last 72 hours.  Scheduled Meds: . cefTRIAXone (ROCEPHIN)  IV  2 g Intravenous Q24H  . enoxaparin (LOVENOX) injection  40 mg Subcutaneous Q24H  . insulin aspart  0-9 Units Subcutaneous 6 times per day  . metoCLOPramide (REGLAN) injection  5 mg Intravenous 4 times per day   Continuous Infusions: . dextrose 5 % with KCl 20 mEq / L 20 mEq (05/30/15 1000)   PRN Meds:.acetaminophen **OR** acetaminophen, morphine injection, promethazine   Imaging: Imaging results have been reviewed    Assessment/Plan:  69 y.o. male with a history of chronic systolic HF, CAD- medical Rx only, and s/p Bi V  PTVDP Jan 2015 for CHB.  He has done pretty well on home hospice the last several months. Admitted now with CAP and staph sepsis. We were consulted to consider removal of pacer hardware.   Principal Problem:   FUO (fever of unknown origin) Active Problems:   Aspiration pneumonia (HCC)   Staphylococcus aureus bacteremia with sepsis ([place])   Pacemaker infection ([place])   HTN (hypertension)   CAD of vein bypass graft   Chronic systolic heart failure (HCC)   Chronic kidney disease, stage III (moderate)   Pacemaker- St Jude Bic- Jan 2015   Diabetes mellitus type II, uncontrolled ([place])   AKI (acute kidney injury) ([place])   Metabolic encephalopathy   Palliative care encounter   Ileus ([place])   Cardiomyopathy, ischemic-30-35%   Hospice care patient- currently limited code   HYPERCHOLESTEROLEMIA   Iron deficiency anemia   Peripheral neuropathy (HCC)   BLINDNESS   Chronic pain syndrome   Thrombocytopenia (HCC)   Pulmonary HTN (HCC)   Pressure ulcer   Adynamic ileus (HCC)   Abnormal x-ray of abdomen   Hypokalemia   CHB (complete heart block) ([place])   Acute respiratory distress ([place])   Controlled type 2 diabetes mellitus with complication, without long-term current use of insulin (  [place])   PLAN: Will discuss with Dr Burt Knack (his cardiologist) in am. I have contacted [employer] to document pacemaker dependence. We do not think he could tolerate pacer hardware removal.   Kerin Ransom PA-C 05/30/2015, 10:12 AM [phone]  I have seen and examined the patient along with Kerin Ransom PA-C.  I have reviewed the chart, notes and new data.  I agree with PA's note.  Key new complaints: not answering questions, although does appear to hear and understand at least some of them Key examination changes: no overt CHF Key new findings / data: device interrogation shows complete heart block and NO escape rhythm. He is pacemaker dependent. He is in atrial fibrillation for the last 6 hours or so, which I think is  a new development for him.  PLAN:  Pacemaker dependent, severe acute and chronic illness. I do not think he could survive device explantation and reimplantation. Palliative care is probably most appropriate approach with chronic suppressive antibiotics. Prognosis is poor. His wife is not here today, but yesterday expressed a desire to pursue home hospice if we recommended it. Dr. Lovena Le will be here tomorrow and will offer an expert opinion regarding management of his device. Will plan to re-interrogate device in AM to see if still in atrial fibrillation. At this point, I am not sure providing full anticoagulation is appropriate.  Sanda Klein, MD, [place] [phone] 05/30/2015, 11:26 AM

## 2015-05-31 ENCOUNTER — Encounter (HOSPITAL_COMMUNITY): Payer: Self-pay | Admitting: Cardiology

## 2015-05-31 ENCOUNTER — Telehealth: Payer: Self-pay | Admitting: Cardiovascular Disease

## 2015-05-31 DIAGNOSIS — T827XXD Infection and inflammatory reaction due to other cardiac and vascular devices, implants and grafts, subsequent encounter: Secondary | ICD-10-CM

## 2015-05-31 LAB — COMPREHENSIVE METABOLIC PANEL
ALBUMIN: 2.6 g/dL — AB (ref 3.5–5.0)
ALK PHOS: 42 U/L (ref 38–126)
ALT: 35 U/L (ref 17–63)
AST: 36 U/L (ref 15–41)
Anion gap: 9 (ref 5–15)
BILIRUBIN TOTAL: 0.9 mg/dL (ref 0.3–1.2)
BUN: 35 mg/dL — ABNORMAL HIGH (ref 6–20)
CALCIUM: 9 mg/dL (ref 8.9–10.3)
CO2: 32 mmol/L (ref 22–32)
CREATININE: 1.28 mg/dL — AB (ref 0.61–1.24)
Chloride: 116 mmol/L — ABNORMAL HIGH (ref 101–111)
GFR calc Af Amer: >60 mL/min (ref >60)
GFR, EST NON AFRICAN AMERICAN: 55 mL/min — AB (ref >60)
GLUCOSE: 180 mg/dL — AB (ref 65–99)
POTASSIUM: 3.7 mmol/L (ref 3.5–5.1)
Sodium: 157 mmol/L — ABNORMAL HIGH (ref 135–145)
TOTAL PROTEIN: 6.1 g/dL — AB (ref 6.5–8.1)

## 2015-05-31 LAB — MAGNESIUM: Magnesium: 2.9 mg/dL — ABNORMAL HIGH (ref 1.7–2.4)

## 2015-05-31 LAB — HCV COMMENT:

## 2015-05-31 LAB — GLUCOSE, CAPILLARY
GLUCOSE-CAPILLARY: 141 mg/dL — AB (ref 65–99)
GLUCOSE-CAPILLARY: 170 mg/dL — AB (ref 65–99)
Glucose-Capillary: 205 mg/dL — ABNORMAL HIGH (ref 65–99)
Glucose-Capillary: 159 mg/dL — ABNORMAL HIGH (ref 65–99)
Glucose-Capillary: 158 mg/dL — ABNORMAL HIGH (ref 65–99)
Glucose-Capillary: 164 mg/dL — ABNORMAL HIGH (ref 65–99)

## 2015-05-31 LAB — HEPATITIS C ANTIBODY (REFLEX): HCV Ab: 0.1 s/co ratio (ref 0.0–0.9)

## 2015-05-31 LAB — CBC WITH DIFFERENTIAL/PLATELET
BASOS ABS: 0 10*3/uL (ref 0.0–0.1)
BASOS PCT: 0 %
EOS ABS: 0 10*3/uL (ref 0.0–0.7)
EOS PCT: 0 %
HEMATOCRIT: 53.2 % — AB (ref 39.0–52.0)
Hemoglobin: 16.9 g/dL (ref 13.0–17.0)
Lymphocytes Relative: 15 %
Lymphs Abs: 1.1 10*3/uL (ref 0.7–4.0)
MCH: 30.2 pg (ref 26.0–34.0)
MCHC: 31.8 g/dL (ref 30.0–36.0)
MCV: 95.2 fL (ref 78.0–100.0)
MONO ABS: 1.1 10*3/uL — AB (ref 0.1–1.0)
Monocytes Relative: 15 %
Neutro Abs: 5.4 10*3/uL (ref 1.7–7.7)
Neutrophils Relative %: 70 %
PLATELETS: 113 10*3/uL — AB (ref 150–400)
RBC: 5.59 MIL/uL (ref 4.22–5.81)
RDW: 14.4 % (ref 11.5–15.5)
WBC: 7.6 10*3/uL (ref 4.0–10.5)

## 2015-05-31 MED ORDER — POTASSIUM CL IN DEXTROSE 5% 20 MEQ/L IV SOLN
20.0000 meq | INTRAVENOUS | Status: DC
  Administered 2015-05-31 – 2015-06-02 (×2): 20 meq via INTRAVENOUS
  Filled 2015-05-31 (×4): qty 1000

## 2015-05-31 NOTE — Telephone Encounter (Signed)
I spoke with the pt's wife and made her aware that Dr Burt Knack agrees with getting EP to evaluate pt. She feels like she will not be able to care for the pt at home if he is discharged.  I made her aware that the palliative care team has seen the pt in consultation and case management will be consulted during hospitalization if the pt needs placement in a facility.  She appreciated my call and will contact the office with any other questions or concerns.

## 2015-05-31 NOTE — Consult Note (Signed)
Reason for Consult: staph bacteremia possible hardware removal   Referring Physician: Dr. Sallyanne Kuster   PCP:  [name], MD  Primary Cardiologist:Dr. 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    Chief Complaint: pt admitted on the 20th with fever of 102 and AMS.   HPI: 69 year old male followed by Dr. Rayann Heman for CHB and cardiomyopathy with EF 30% receiving St. Jude BiV pacemaker  Jan 2015.  He has CAD followed by Dr. Burt Knack with CABG 1998 and promus DES to VG->diag in 2015.  Other grafts, to LCX and RCA were occluded, patent LIMA-LAD thoughLAD distal to the LIMA insertion is severely and diffusely diseased " diabetic pattern".  Other hx of  recurrent ischemic events with non-ST elevation infarctions, permanent pacemaker, diabetes, and severe peripheral neuropathy.  He had been on hospice with his CHF but he was discharged in June of this year.    Admitted on the 20Th with fever and AMS, dx with CAP-staph sepsis-MSSA.  He has been seen by ID and on antibiotics.  Dr. Sallyanne Kuster saw for cardiology consult and stated "I strongly doubt that Mr. Demeyer would survive the dangerous procedure of pacemaker 3-lead extraction and then deal with the worsening HF due to loss of BiV pacing. He would require 3 procedures: pacemaker system extraction, "temporary-permanent" pacemaker and finally implantation of a new permanent system."  Pt's wife is not sure he would tolerate, but his brothers are coming in also to discuss.  Palliative care has seen and Dr. Jerilynn Mages. Croitoru felt this may be best approach, though he would need SNF for care.  For interrogation today.  Last check 03/2015 with 99% biV pacing.    Today pt does not answer questions or follow commands.  He looks at me as if he may hear me but does not follow instruction.   Past Medical History  Diagnosis Date  . CORONARY ARTERY DISEASE     a. s/p CABG 1998. b.  08/2011: inferior STEMI.  LHC 09/07/11: Severe 3v CAD, LIMA-LAD  patent, SVG-RCA chronically occluded, SVG-circumflex chronically occluded, SVG-diagonal 95%. PCI: Promus DES to the SVG-diagonal.  Procedure c/b transient CHB req CPR and TTVP. c. Myoview 11/2011: scar but no ischemia.   [name] DIABETES MELLITUS, TYPE I   . HYPERTENSION   . 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 HYPERCHOLESTEROLEMIA   . CERVICAL RADICULOPATHY, LEFT   . PERIPHERAL NEUROPATHY   . Chronic systolic heart failure (HCC)     Echocardiogram 08/09/13:Cavity size normal, EF 40%, inferior hypokinesis, grade 1 diastolic dysfunction, paradoxical septal motion, mild MR, mild LAE, no PFO, PAS 51, moderate pulm HTN  . ED (erectile dysfunction)   . Hypogonadism male   . GI bleed 2012    Multiple Duodenal ulcer   . Obesity   . Legally blind     bilaterally  . Stroke [facility]) 1945    age 44 months; residual right sided weakness  . History of blood transfusion 2012  . Charcot foot due to diabetes mellitus (HCC)     chronic pain  . Osteoarthritis     right hip and shoulder  . COPD (chronic obstructive pulmonary disease) ([place])   . 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 Hiatal hernia   . Chronic stasis dermatitis     Chronic stasis changes.  . CKD (chronic kidney disease), stage III   . Syncope     a. 07/2013: suspected  due to hypotension.  . Biventricular cardiac pacemaker in situ     a. [address] Jude [employer] RF model [id number] (serial number [id number])  . Complete heart block (HCC)     a. s/p pacemaker placement on 08/2013 admission  . Duodenal ulcer     Past Surgical History  Procedure Laterality Date  . Carpal tunnel release Left   . Sigmoidoscopy  03/11/2001  . Venous doppler  01/30/2004  . Tonsillectomy and adenoidectomy      "as a kid"  . Cataract extraction w/ intraocular lens  implant, bilateral    . Coronary angioplasty with stent placement  08/2011    "1"  . Retinopathy surgery Bilateral 2000's    "laser; both eyes"  . Wrist fusion Right  1976  . Anterior fusion cervical spine  2010    "C spine; Dr. Ronnald Ramp"  . Ankle fracture surgery Left 1976; ?date    LEFT:  fused; removed bulk of hardware  . Heel spur surgery Left ~ 2007    ? left  . Irrigation and debridement knee  02/27/2012    Procedure: IRRIGATION AND DEBRIDEMENT KNEE;  Surgeon: Augustin Schooling, MD;  Location: [facility];  Service: Orthopedics;  Laterality: Right;  irrigation and drainage right knee septic bursitis  . Eye surgery  2008    cataract removal, cautery  . Coronary artery bypass graft  1998    CABG X5  . Fracture surgery      foot broken 1976  . Pacemaker insertion  08/08/2013    Biventricular pacemaker: Bennett RF model [id number] (serial number [id number])  . Left heart catheterization with coronary/graft angiogram N/A 09/07/2011    Procedure: LEFT HEART CATHETERIZATION WITH [name];  Surgeon: Jettie Booze, MD;  Location: [facility] CATH LAB;  Service: Cardiovascular;  Laterality: N/A;  . Temporary pacemaker insertion  08/07/2013    Procedure: TEMPORARY PACEMAKER INSERTION;  Surgeon: Leonie Man, MD;  Location: [facility] CATH LAB;  Service: Cardiovascular;;  . Left heart catheterization with coronary/graft angiogram  08/07/2013    Procedure: LEFT HEART CATHETERIZATION WITH [name];  Surgeon: Leonie Man, MD;  Location: [facility] CATH LAB;  Service: Cardiovascular;;  . Bi-ventricular pacemaker insertion N/A 08/08/2013    Procedure: BI-VENTRICULAR PACEMAKER INSERTION (CRT-P);  Surgeon: Coralyn Mark, MD;  Location: [facility] CATH LAB;  Service: Cardiovascular;  Laterality: N/A;  . Left heart catheterization with coronary/graft angiogram N/A 10/27/2013    Procedure: LEFT HEART CATHETERIZATION WITH [name];  Surgeon: Sinclair Grooms, MD;  Location: [facility] CATH LAB;  Service: Cardiovascular;  Laterality: N/A;    Family History  Problem Relation Age of Onset  . CAD Mother     Coronary Artery Disease  . CAD Father      Coronary Artery Disease  . CAD Paternal Grandmother     Coronary Artery Disease  . Colon cancer Neg Hx   . Diabetes Maternal Grandmother   . Cancer Maternal Grandmother     male cancer ? type  . Diabetes Brother    Social History:  reports that he has never smoked. He has never used smokeless tobacco. He reports that he does not drink alcohol or use illicit drugs.  Allergies:  Allergies  Allergen Reactions  . Sildenafil Nausea Only and Other (See Comments)    "took it once; heart went to pieces; never took it again"  . Percocet [Oxycodone-Acetaminophen] Other (See Comments)    Unknown reaction-possible altered mental state-per wife.    [name]  Contrast Media [Iodinated Diagnostic Agents] Nausea Only    OUTPATIENT MEDICATIONS: No current facility-administered medications on file prior to encounter.   Current Outpatient Prescriptions on File Prior to Encounter  Medication Sig Dispense Refill  . AMBULATORY NON FORMULARY MEDICATION Pt on O2 - 2 liter    . aspirin EC 81 MG tablet Take 81 mg by mouth at bedtime.    [name] atorvastatin (LIPITOR) 40 MG tablet TAKE 1 TABLET BY MOUTH EVERY DAY 30 tablet 3  . Blood Glucose Monitoring Suppl (BAYER BREEZE 2 SYSTEM) W/DEVICE KIT 1 Device by Does not apply route once. 1 each 0  . calcitRIOL (ROCALTROL) 0.25 MCG capsule TAKE 1 CAPSULE BY MOUTH EVERY DAY 30 capsule 2  . carvedilol (COREG) 3.125 MG tablet TAKE 1 TABLET BY MOUTH TWICE DAILY WITH A MEAL 60 tablet 5  . clopidogrel (PLAVIX) 75 MG tablet TAKE 1 TABLET BY MOUTH EVERY DAY 30 tablet 11  . co-enzyme Q-10 30 MG capsule Take 30 mg by mouth daily. @@ 2pm    . docusate sodium (COLACE) 100 MG capsule Take 300 mg by mouth daily.     . famotidine (PEPCID) 20 MG tablet Take 20 mg by mouth before cath procedure. stomach    . furosemide (LASIX) 40 MG tablet TAKE 2 TABLETS BY MOUTH TWICE DAILY 120 tablet 3  . gabapentin (NEURONTIN) 400 MG capsule Take 2 capsules (800 mg total) by mouth 3 (three) times daily.  180 capsule 3  . HYDROcodone-acetaminophen (NORCO) 10-325 MG tablet Take 1-2 tablets by mouth every 4 (four) hours as needed for moderate pain. 150 tablet 0  . hyoscyamine (LEVSIN SL) 0.125 MG SL tablet DISSOLVE 1 TABLET UNDER THE TONGUE EVERY 4 HOURS AS NEEDED 120 tablet 0  . isosorbide mononitrate (IMDUR) 30 MG 24 hr tablet TAKE 1 TABLET BY MOUTH EVERY DAY 30 tablet 6  . LORazepam (ATIVAN) 0.5 MG tablet Take 1 tablet (0.5 mg total) by mouth every 6 (six) hours as needed for anxiety (anxiety). 50 tablet 3  . losartan (COZAAR) 50 MG tablet TAKE 1/2 TABLET BY MOUTH EVERY DAY 45 tablet 3  . methadone (DOLOPHINE) 5 MG tablet Take 1 tablet (5 mg total) by mouth 2 (two) times daily. 60 tablet 0  . Morphine Sulfate (MORPHINE CONCENTRATE) 10 mg / 0.5 ml concentrated solution Place 0.25-0.5 mLs (5-10 mg total) under the tongue every 2 (two) hours as needed for moderate pain, severe pain, anxiety or shortness of breath. 15 mL 0  . nitroGLYCERIN (NITROSTAT) 0.4 MG SL tablet Place 1 tablet (0.4 mg total) under the tongue every 5 (five) minutes as needed for chest pain. 25 tablet 5  . OVER THE COUNTER MEDICATION Place 1 drop into both eyes daily as needed (dry eyes). [facility] eye drops    . pantoprazole (PROTONIX) 40 MG tablet Take 1 tablet (40 mg total) by mouth 2 (two) times daily. 60 tablet 3  . polyethylene glycol powder (GLYCOLAX/MIRALAX) powder Take 1 Container by mouth daily.   1  . polyethylene glycol powder (GLYCOLAX/MIRALAX) powder DISSOLVE 9 GRAMS( 1/2 CAPFUL) IN AT LEAST 8 OUNCES OF WATER/ JUICE AND DRINK THREE TIMES WEEKLY 527 g 2  . potassium chloride SA (K-DUR,KLOR-CON) 20 MEQ tablet TAKE 1 TABLET BY MOUTH TWICE DAILY 60 tablet 5  . tamsulosin (FLOMAX) 0.4 MG CAPS capsule TAKE 1 CAPSULE BY MOUTH DAILY 30 capsule 0  . tolnaftate (ANTI-FUNGAL) 1 % powder Apply 1 application topically 3 (three) times a week. As needed for infection    .  dicyclomine (BENTYL) 20 MG tablet Take 1 tablet (20 mg total)  by mouth 2 (two) times daily. (Patient not taking: Reported on 05/27/2015) 30 tablet 0  . hyoscyamine (LEVSIN SL) 0.125 MG SL tablet DISSOLVE 1 TABLET UNDER THE TONGUE EVERY 4 HOURS AS NEEDED (Patient not taking: Reported on 05/27/2015) 120 tablet 1   CURRENT MEDICATIONS: Scheduled Meds: . cefTRIAXone (ROCEPHIN)  IV  2 g Intravenous Q24H  . enoxaparin (LOVENOX) injection  40 mg Subcutaneous Q24H  . insulin aspart  0-9 Units Subcutaneous 6 times per day  . metoCLOPramide (REGLAN) injection  5 mg Intravenous 4 times per day   Continuous Infusions: . dextrose 5 % with KCl 20 mEq / L 20 mEq (05/31/15 1029)   PRN Meds:.acetaminophen **OR** acetaminophen, hydrALAZINE, morphine injection, promethazine   Results for orders placed or performed during the hospital encounter of 05/27/15 (from the past 48 hour(s))  Glucose, capillary     Status: Abnormal   Collection Time: 05/29/15 11:44 AM  Result Value Ref Range   Glucose-Capillary 160 (H) 65 - 99 mg/dL   Comment 1 Notify RN    Comment 2 Document in Chart   Potassium     Status: None   Collection Time: 05/29/15 11:45 AM  Result Value Ref Range   Potassium 4.0 3.5 - 5.1 mmol/L    Comment: DELTA CHECK NOTED MARKED HEMOLYSIS HEMOLYSIS AT THIS LEVEL MAY AFFECT RESULT   Magnesium     Status: Abnormal   Collection Time: 05/29/15 11:45 AM  Result Value Ref Range   Magnesium 2.5 (H) 1.7 - 2.4 mg/dL  Culture, blood (routine x 2)     Status: None (Preliminary result)   Collection Time: 05/29/15 11:45 AM  Result Value Ref Range   Specimen Description BLOOD RIGHT ANTECUBITAL    Special Requests BOTTLES DRAWN AEROBIC AND ANAEROBIC 10CC    Culture NO GROWTH < 24 HOURS    Report Status PENDING   Culture, blood (routine x 2)     Status: None (Preliminary result)   Collection Time: 05/29/15 12:00 PM  Result Value Ref Range   Specimen Description BLOOD LEFT ANTECUBITAL    Special Requests BOTTLES DRAWN AEROBIC AND ANAEROBIC 10CC    Culture NO  GROWTH < 24 HOURS    Report Status PENDING   Glucose, capillary     Status: Abnormal   Collection Time: 05/29/15  3:47 PM  Result Value Ref Range   Glucose-Capillary 147 (H) 65 - 99 mg/dL  Glucose, capillary     Status: Abnormal   Collection Time: 05/29/15  7:56 PM  Result Value Ref Range   Glucose-Capillary 140 (H) 65 - 99 mg/dL   Comment 1 Notify RN   Glucose, capillary     Status: Abnormal   Collection Time: 05/29/15 11:16 PM  Result Value Ref Range   Glucose-Capillary 121 (H) 65 - 99 mg/dL   Comment 1 Notify RN   Comprehensive metabolic panel     Status: Abnormal   Collection Time: 05/30/15  4:20 AM  Result Value Ref Range   Sodium 149 (H) 135 - 145 mmol/L   Potassium 3.4 (L) 3.5 - 5.1 mmol/L   Chloride 107 101 - 111 mmol/L   CO2 30 22 - 32 mmol/L   Glucose, Bld 163 (H) 65 - 99 mg/dL   BUN 32 (H) 6 - 20 mg/dL   Creatinine, Ser 1.20 0.61 - 1.24 mg/dL   Calcium 8.6 (L) 8.9 - 10.3 mg/dL   Total Protein 6.6 6.5 -  8.1 g/dL   Albumin 2.6 (L) 3.5 - 5.0 g/dL   AST 54 (H) 15 - 41 U/L   ALT 45 17 - 63 U/L   Alkaline Phosphatase 45 38 - 126 U/L   Total Bilirubin 1.2 0.3 - 1.2 mg/dL   GFR calc non Af Amer 59 (L) >60 mL/min   GFR calc Af Amer >60 >60 mL/min    Comment: (NOTE) The eGFR has been calculated using the CKD EPI equation. This calculation has not been validated in all clinical situations. eGFR's persistently <60 mL/min signify possible Chronic Kidney Disease.    Anion gap 12 5 - 15  CBC with Differential/Platelet     Status: Abnormal   Collection Time: 05/30/15  4:20 AM  Result Value Ref Range   WBC 7.4 4.0 - 10.5 K/uL    Comment: REPEATED TO VERIFY WHITE COUNT CONFIRMED ON SMEAR    RBC 5.68 4.22 - 5.81 MIL/uL   Hemoglobin 17.6 (H) 13.0 - 17.0 g/dL   HCT 53.0 (H) 39.0 - 52.0 %   MCV 93.3 78.0 - 100.0 fL   MCH 31.0 26.0 - 34.0 pg   MCHC 33.2 30.0 - 36.0 g/dL   RDW 14.2 11.5 - 15.5 %   Platelets 92 (L) 150 - 400 K/uL    Comment: SPECIMEN CHECKED FOR  CLOTS REPEATED TO VERIFY CONSISTENT WITH PREVIOUS RESULT PLATELET COUNT CONFIRMED BY SMEAR    Neutrophils Relative % 75 %   Lymphocytes Relative 12 %   Monocytes Relative 13 %   Eosinophils Relative 0 %   Basophils Relative 0 %   Neutro Abs 5.5 1.7 - 7.7 K/uL   Lymphs Abs 0.9 0.7 - 4.0 K/uL   Monocytes Absolute 1.0 0.1 - 1.0 K/uL   Eosinophils Absolute 0.0 0.0 - 0.7 K/uL   Basophils Absolute 0.0 0.0 - 0.1 K/uL   Smear Review MORPHOLOGY UNREMARKABLE   Magnesium     Status: Abnormal   Collection Time: 05/30/15  4:20 AM  Result Value Ref Range   Magnesium 2.6 (H) 1.7 - 2.4 mg/dL  HIV antibody     Status: None   Collection Time: 05/30/15  4:20 AM  Result Value Ref Range   HIV Screen 4th Generation wRfx Non Reactive Non Reactive    Comment: (NOTE) Performed At: [facility] [place], [place] [id number] Lindon Romp MD IO:[id number]   Glucose, capillary     Status: Abnormal   Collection Time: 05/30/15  4:23 AM  Result Value Ref Range   Glucose-Capillary 143 (H) 65 - 99 mg/dL   Comment 1 Notify RN   Glucose, capillary     Status: Abnormal   Collection Time: 05/30/15  8:19 AM  Result Value Ref Range   Glucose-Capillary 153 (H) 65 - 99 mg/dL  Glucose, capillary     Status: Abnormal   Collection Time: 05/30/15 11:31 AM  Result Value Ref Range   Glucose-Capillary 147 (H) 65 - 99 mg/dL  Glucose, capillary     Status: Abnormal   Collection Time: 05/30/15  3:28 PM  Result Value Ref Range   Glucose-Capillary 166 (H) 65 - 99 mg/dL  Glucose, capillary     Status: Abnormal   Collection Time: 05/30/15  7:59 PM  Result Value Ref Range   Glucose-Capillary 146 (H) 65 - 99 mg/dL  Glucose, capillary     Status: Abnormal   Collection Time: 05/30/15 11:09 PM  Result Value Ref Range   Glucose-Capillary 196 (H) 65 - 99  mg/dL  Comprehensive metabolic panel     Status: Abnormal   Collection Time: 05/31/15  3:58 AM  Result Value Ref Range   Sodium 157 (H) 135 - 145  mmol/L    Comment: DELTA CHECK NOTED   Potassium 3.7 3.5 - 5.1 mmol/L   Chloride 116 (H) 101 - 111 mmol/L   CO2 32 22 - 32 mmol/L   Glucose, Bld 180 (H) 65 - 99 mg/dL   BUN 35 (H) 6 - 20 mg/dL   Creatinine, Ser 1.28 (H) 0.61 - 1.24 mg/dL   Calcium 9.0 8.9 - 10.3 mg/dL   Total Protein 6.1 (L) 6.5 - 8.1 g/dL   Albumin 2.6 (L) 3.5 - 5.0 g/dL   AST 36 15 - 41 U/L   ALT 35 17 - 63 U/L   Alkaline Phosphatase 42 38 - 126 U/L   Total Bilirubin 0.9 0.3 - 1.2 mg/dL   GFR calc non Af Amer 55 (L) >60 mL/min   GFR calc Af Amer >60 >60 mL/min    Comment: (NOTE) The eGFR has been calculated using the CKD EPI equation. This calculation has not been validated in all clinical situations. eGFR's persistently <60 mL/min signify possible Chronic Kidney Disease.    Anion gap 9 5 - 15  CBC with Differential/Platelet     Status: Abnormal   Collection Time: 05/31/15  3:58 AM  Result Value Ref Range   WBC 7.6 4.0 - 10.5 K/uL   RBC 5.59 4.22 - 5.81 MIL/uL   Hemoglobin 16.9 13.0 - 17.0 g/dL   HCT 53.2 (H) 39.0 - 52.0 %   MCV 95.2 78.0 - 100.0 fL   MCH 30.2 26.0 - 34.0 pg   MCHC 31.8 30.0 - 36.0 g/dL   RDW 14.4 11.5 - 15.5 %   Platelets 113 (L) 150 - 400 K/uL    Comment: REPEATED TO VERIFY CONSISTENT WITH PREVIOUS RESULT    Neutrophils Relative % 70 %   Neutro Abs 5.4 1.7 - 7.7 K/uL   Lymphocytes Relative 15 %   Lymphs Abs 1.1 0.7 - 4.0 K/uL   Monocytes Relative 15 %   Monocytes Absolute 1.1 (H) 0.1 - 1.0 K/uL   Eosinophils Relative 0 %   Eosinophils Absolute 0.0 0.0 - 0.7 K/uL   Basophils Relative 0 %   Basophils Absolute 0.0 0.0 - 0.1 K/uL  Magnesium     Status: Abnormal   Collection Time: 05/31/15  3:58 AM  Result Value Ref Range   Magnesium 2.9 (H) 1.7 - 2.4 mg/dL  Glucose, capillary     Status: Abnormal   Collection Time: 05/31/15  4:17 AM  Result Value Ref Range   Glucose-Capillary 205 (H) 65 - 99 mg/dL  Glucose, capillary     Status: Abnormal   Collection Time: 05/31/15  8:14 AM    Result Value Ref Range   Glucose-Capillary 159 (H) 65 - 99 mg/dL   Comment 1 Notify RN    Comment 2 Document in Chart    Dg Abd 2 Views  05/30/2015  CLINICAL DATA:  Ileus EXAM: ABDOMEN - 2 VIEW COMPARISON:  05/28/2015 FINDINGS: Enteric tube terminates in the distal second portion of the duodenum. Nonobstructive bowel gas pattern. No evidence of free air on the lateral decubitus view. Degenerative changes of the lumbar spine. IMPRESSION: Enteric tube terminates in the distal second portion the duodenum. No evidence of small bowel obstruction or free air. Electronically Signed   By: [name]   On: 05/30/2015  10:02    ROS: pt could not answer, his wife stated he was without problems until Wed the 20th and suddenly ill. General: + fevers, + weight loss Skin:no rashes or ulcers HEENT:unable to determine CV:see HPI PUL:see HPI GI:no diarrhea constipation or melena, no indigestion GU:no hematuria, no dysuria MS:no joint pain, no claudication Neuro:no syncope, no lightheadedness Endo:no diabetes, no thyroid disease Above not that wife was aware   Blood pressure 158/71, pulse 100, temperature 98.4 F (36.9 C), temperature source Axillary, resp. rate 34, height 5' 1"  (1.549 m), weight 179 lb 10.8 oz (81.5 kg), SpO2 96 %.  Wt Readings from Last 3 Encounters:  05/31/15 179 lb 10.8 oz (81.5 kg)  03/23/15 189 lb (85.73 kg)  02/19/15 192 lb (87.091 kg)    PE: General:Awake, chronically ill appearing, NAD Skin:Warm and dry, brisk capillary refill- skin on legs red and tender to touch pt cries out when pressed HEENT:normocephalic, sclera clear, mucus membranes moist Neck:supple, no JVD, no bruits  Heart:S1S2 RRR without murmur, gallup, rub or click Lungs:clear, ant.  without rales, rhonchi, or wheezes JLU:NGBM, non tender, + BS, do not palpate liver spleen or masses Ext:no lower ext edema,  2+ radial pulses Neuro:awake does not answer questions.      Assessment/Plan Principal Problem:   FUO (fever of unknown origin) Active Problems:   HYPERCHOLESTEROLEMIA   Iron deficiency anemia   Peripheral neuropathy (HCC)   BLINDNESS   HTN (hypertension)   CAD of vein bypass graft   Chronic systolic heart failure (HCC)   Chronic kidney disease, stage III (moderate)   Pacemaker- St Jude Bic- Jan 2015   Chronic pain syndrome   Diabetes mellitus type II, uncontrolled (HCC)   AKI (acute kidney injury) ([place])   Thrombocytopenia ([place])   Metabolic encephalopathy   Pulmonary HTN ([place])   Palliative care encounter   Aspiration pneumonia ([place])   Pressure ulcer   Ileus ([place])   Staphylococcus aureus bacteremia with sepsis ([place])   Pacemaker infection ([place])   Cardiomyopathy, ischemic-30-35%   Hospice care patient- currently limited code   Adynamic ileus ([place])   Abnormal x-ray of abdomen   Hypokalemia   CHB (complete heart block) ([place])   Acute respiratory distress ([place])   Controlled type 2 diabetes mellitus with complication, without long-term current use of insulin (HCC)   Paroxysmal atrial fibrillation (HCC)   Hypoxia  Dr. Lovena Le to see after interrogation, though pt was in CHB prior to procedure so would need temporary device and without  BiV would have increasing HF.  Continues CHB and a fib.  Do not recommending removing device at this point.  See Dr. Tanna Furry note.    Sebastian  Nurse Practitioner Certified [place] Pager [phone] or after 5pm or weekends call [phone] 05/31/2015, 11:22 AM     EP Attending  Patient seen and examined. Agree with the findings as noted above. 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 I discussed the treatment options with the family. I do not think PM system extraction is best and we should pursue comfort measures only. Consider palliative care.  Mikle Bosworth.D.

## 2015-05-31 NOTE — Progress Notes (Signed)
Patient Name: Mark French Date of Encounter: 05/31/2015   SUBJECTIVE  Open eyes with voice. Does not follows commands.   CURRENT MEDS . cefTRIAXone (ROCEPHIN)  IV  2 g Intravenous Q24H  . enoxaparin (LOVENOX) injection  40 mg Subcutaneous Q24H  . insulin aspart  0-9 Units Subcutaneous 6 times per day  . metoCLOPramide (REGLAN) injection  5 mg Intravenous 4 times per day    OBJECTIVE  Filed Vitals:   05/31/15 0441 05/31/15 0506 05/31/15 0805 05/31/15 0808  BP:  160/69 158/71   Pulse:  101 100   Temp:    98.4 F (36.9 C)  TempSrc:    Axillary  Resp:  36 34   Height:      Weight: 179 lb 10.8 oz (81.5 kg)     SpO2:  96% 96%     Intake/Output Summary (Last 24 hours) at 05/31/15 1023 Last data filed at 05/31/15 0600  Gross per 24 hour  Intake    530 ml  Output   1475 ml  Net   -945 ml   Filed Weights   05/29/15 0443 05/30/15 0425 05/31/15 0441  Weight: 183 lb 3.2 oz (83.1 kg) 184 lb 4.9 oz (83.6 kg) 179 lb 10.8 oz (81.5 kg)    PHYSICAL EXAM  General: chronically ill appearing male Neuro: Open eyes to voice.  Moves all extremities spontaneously. Psych: Unable to assess Lungs:  Rhonchi  anteriorally Heart: RRR no s3, s4, or murmurs. Abdomen: Soft, non-tender, non-distended, BS + x 4.  Extremities: dark appearing skin BL LE  Accessory Clinical Findings  CBC  Recent Labs  05/30/15 0420 05/31/15 0358  WBC 7.4 7.6  NEUTROABS 5.5 5.4  HGB 17.6* 16.9  HCT 53.0* 53.2*  MCV 93.3 95.2  PLT 92* 456*   Basic Metabolic Panel  Recent Labs  05/30/15 0420 05/31/15 0358  NA 149* 157*  K 3.4* 3.7  CL 107 116*  CO2 30 32  GLUCOSE 163* 180*  BUN 32* 35*  CREATININE 1.20 1.28*  CALCIUM 8.6* 9.0  MG 2.6* 2.9*   Liver Function Tests  Recent Labs  05/30/15 0420 05/31/15 0358  AST 54* 36  ALT 45 35  ALKPHOS 45 42  BILITOT 1.2 0.9  PROT 6.6 6.1*  ALBUMIN 2.6* 2.6*    Recent Labs  05/29/15 0855  LIPASE 37  AMYLASE 53   Cardiac  Enzymes  Recent Labs  05/28/15 1953 05/29/15 0653  TROPONINI 0.29* 1.13*   TELE  Paced rhythm with PVCs  Radiology/Studies  Ct Head Wo Contrast  05/28/2015  CLINICAL DATA:  Left lower extremity paralysis, new onset somnolence EXAM: CT HEAD WITHOUT CONTRAST TECHNIQUE: Contiguous axial images were obtained from the base of the skull through the vertex without intravenous contrast. COMPARISON:  07/06/2014 FINDINGS: No evidence of parenchymal hemorrhage or extra-axial fluid collection. No mass lesion, mass effect, or midline shift. No CT evidence of acute infarction. Old left frontal infarct (series 2/ image 21). Subcortical white matter and periventricular small vessel ischemic changes. Intracranial atherosclerosis. Global cortical atrophy.  No ventriculomegaly. The visualized paranasal sinuses are essentially clear. The mastoid air cells are unopacified. No evidence of calvarial fracture. IMPRESSION: No evidence of acute intracranial abnormality. Old left frontal infarct. Atrophy with small vessel ischemic changes. Electronically Signed   By: [name]   On: 05/28/2015 17:29   Dg Chest Port 1 View  05/29/2015  CLINICAL DATA:  Acute respiratory distress EXAM: PORTABLE CHEST 1 VIEW COMPARISON:  05/29/2015  FINDINGS: NG tube stable. Pacemaker and lead from the left subclavian vein stable. Lungs less aerated. Pleural effusions increased. Pulmonary edema is stable on the left and improved on the right. No pneumothorax. IMPRESSION: Bilateral pulmonary edema stable on the left and improved on the right. Worsening low volumes and bilateral pleural effusions. Electronically Signed   By: Marybelle Killings M.D.   On: 05/29/2015 08:54   Dg Chest Port 1 View  05/29/2015  CLINICAL DATA:  Acute onset of hypoxia and tachypnea. Initial encounter. EXAM: PORTABLE CHEST 1 VIEW COMPARISON:  Chest radiograph from 05/28/2015 FINDINGS: The patient's enteric tube is noted extending below the diaphragm. New  bilateral airspace opacification is noted, possibly reflecting pulmonary edema or pneumonia. No pleural effusion or pneumothorax is seen. The cardiomediastinal silhouette is borderline normal in size. The patient is status post median sternotomy, with evidence of prior CABG. A pacemaker is noted overlying the left chest wall, with leads ending overlying the right atrium and right ventricle. No acute osseous abnormalities are seen. Cervical spinal fusion hardware is partially imaged. IMPRESSION: New bilateral airspace opacification, possibly reflecting pulmonary edema or pneumonia. Electronically Signed   By: Garald Balding M.D.   On: 05/29/2015 00:55   Dg Chest Port 1 View  05/28/2015  CLINICAL DATA:  Aspiration pneumonia. EXAM: PORTABLE CHEST 1 VIEW COMPARISON:  05/27/2015 FINDINGS: Sternotomy wires and left-sided pacemaker unchanged. Patient is rotated to the right. Lungs are adequately inflated demonstrate continued mild prominence of the perihilar markings left-greater-than-right likely mild vascular congestion although cannot exclude developing infection in the left mid to lower lung. No evidence of effusion. Mild stable cardiomegaly. Remainder of the exam is unchanged. IMPRESSION: Stable prominence of the perihilar markings left-greater-than-right suggesting mild vascular congestion, although cannot exclude developing infection in the left mid to lower lung. Mild stable cardiomegaly. Electronically Signed   By: Marin Olp M.D.   On: 05/28/2015 12:18   Dg Chest Portable 1 View  05/27/2015  CLINICAL DATA:  Code sepsis EXAM: PORTABLE CHEST 1 VIEW COMPARISON:  06/09/2014 FINDINGS: Chronic cardiomegaly with pulmonary venous congestion and interstitial coarsening above baseline. No pneumonia, effusion, or air leak. Status post CABG. There is a biventricular pacer from the left without evidence of interval lead displacement. IMPRESSION: Mild CHF. Electronically Signed   By: Monte Fantasia M.D.   On:  05/27/2015 11:14   Dg Abd 2 Views  05/30/2015  CLINICAL DATA:  Ileus EXAM: ABDOMEN - 2 VIEW COMPARISON:  05/28/2015 FINDINGS: Enteric tube terminates in the distal second portion of the duodenum. Nonobstructive bowel gas pattern. No evidence of free air on the lateral decubitus view. Degenerative changes of the lumbar spine. IMPRESSION: Enteric tube terminates in the distal second portion the duodenum. No evidence of small bowel obstruction or free air. Electronically Signed   By: [name]   On: 05/30/2015 10:02   Dg Abd 2 Views  05/28/2015  CLINICAL DATA:  Fevers and vomiting EXAM: ABDOMEN - 2 VIEW COMPARISON:  None. FINDINGS: Scattered large and small bowel gas is noted. No free air is seen. No obstructive changes are noted. The overall appearance suggests an underlying ileus. Clinical correlation with the physical exam is recommended. No bony abnormality is seen. No abnormal mass or abnormal calcifications are noted. IMPRESSION: Changes suggestive of a generalized ileus. Correlation with the physical exam is recommended. Electronically Signed   By: Inez Catalina M.D.   On: 05/28/2015 17:41    ASSESSMENT AND PLAN  69 y.o. male with a history  of chronic systolic HF, CAD- medical Rx only, and s/p Bi V PTVDP Jan 2015 for CHB. He has done pretty well on home hospice the last several months. Admitted now with CAP and staph sepsis. We were consulted to consider removal of pacer hardware.    Principal Problem:   FUO (fever of unknown origin) Active Problems:   HYPERCHOLESTEROLEMIA   Iron deficiency anemia   Peripheral neuropathy (HCC)   BLINDNESS   HTN (hypertension)   CAD of vein bypass graft   Chronic systolic heart failure (HCC)   Chronic kidney disease, stage III (moderate)   Pacemaker- [employer] Bic- Jan 2015   Chronic pain syndrome   Diabetes mellitus type II, uncontrolled (HCC)   AKI (acute kidney injury) ([place])   Thrombocytopenia ([place])   Metabolic encephalopathy    Pulmonary HTN ([place])   Palliative care encounter   Aspiration pneumonia ([place])   Pressure ulcer   Ileus ([place])   Staphylococcus aureus bacteremia with sepsis ([place])   Pacemaker infection ([place])   Cardiomyopathy, ischemic-30-35%   Hospice care patient- currently limited code   Adynamic ileus ([place])   Abnormal x-ray of abdomen   Hypokalemia   CHB (complete heart block) ([place])   Acute respiratory distress ([place])   Controlled type 2 diabetes mellitus with complication, without long-term current use of insulin (HCC)   Paroxysmal atrial fibrillation ([place])   Hypoxia    Plan: Patient with MSSA bacteremia. Dr. Loletha Grayer mention to get EP eval regarding pacemaker management. Tele with paced rhythm. Likely unable to tolerate pacer removal. Further plan per MD.   Jarrett Soho PA-C Pager [phone]  Patient seen and examined and history reviewed. Agree with above findings and plan. Discussed situation with wife. She understands that without pacemaker removal we may be unable to clear infection. Given his multiple medical problems pacer removal is also risky and may not be in his best interest. Will get EP input today so she can make informed decision.   Peter Martinique, [place] 05/31/2015 12:42 PM

## 2015-05-31 NOTE — Progress Notes (Signed)
UR COMPLETED  

## 2015-05-31 NOTE — Telephone Encounter (Signed)
New message      Talk to Dr Burt Knack.  Pt is in the hosp and wife want to talk to him to get his opinion on what to do with pt after he leaves the hosp

## 2015-05-31 NOTE — Progress Notes (Signed)
[facility] TEAM 1 - Stepdown/ICU TEAM PROGRESS NOTE  Mark French ZOX:[id number] DOB: 09/28/1945 DOA: 05/27/2015 PCP: [name], MD  Admit HPI / Brief Narrative: 69 year old M Hx DM2 controlled with SSI and diet, Chronic combined Systolic and Diastolic CHF, severe Pulmonary HTN, CAD S/P CABG, HTN, Complete Heart Block requiring pacemaker placement, HLD, hypertension, legally blind from diabetic retinopathy, severe peripheral neuropathy, compression fracture low back with chronic pain, childhood stroke 1945, chronic stasis dermatitis lower extremities, CKD stage III, and a Duodenal Ulcer who was sent to the ER by his wife via EMS after developing fevers to 102.3. No apparent associated symptoms such as coughing, nausea, vomiting or diarrhea.   In the ER the patient had a rectal temperature of 102.6.  Portable chest x-ray was read as mild CHF but had more of an appearance of pneumonitis. Patient had mild acute renal failure with creatinine of 1.27 with baseline creatinine 1.18. Lactic acid was mildly elevated at 2.1 with repeat lactic acid several hours later 2.88. Urinalysis did not appear consistent with a UTI.   HPI/Subjective: The patient is noncommunicative.  He does not appear to be uncomfortable.  Assessment/Plan:  MSSA bacteremia w/ suspected endocarditis  -Blood cultures 2/2 positive   -Cardiology consult; removal of Pacemaker impractical - family weighing options / setting goals of care   Acute respiratory failure with hypoxemia / aspiration pneumonitis  -Continue supportive care with oxygen - due to recent emesis and altered mentation not an appropriate candidate for BiPAP   Bilateral pleural effusions -10/22 PCXR shows worsening pulmonary edema and bilateral pleural effusions  Metabolic encephalopathy -Suspect related to fever and underlying infectious processes -NPO   AKI on Chronic kidney disease, stage III -Creatinine without significant change  Generalized ileus    -Most likely multifactorial to include chronic high-dose narcotic use, MSSA Staph bacteremia  -GI consulted; recommend continuing NG tube and rectal tube  Thrombocytopenia  -Suspect related to underlying infectious process   Hypokalemia -K+ WNL today   HTN  -follow BP w/o change in tx plan today   CAD of autologous vein bypass graft -no evidence of ACS presently   Chronic systolic and diastolic heart failure  -Ejection fraction 40% last echocardiogram March 2015 revealed severe hypokinesis of the inferior wall with left ventricular dilatation -Echocardiogram 10/21; slightly worsening EF -no evidence signif volume overload at this time   Pacemaker -Currently actively pacing  Pulmonary HTN / COPD -Severe with most recent reading of 83 mmHg in 2015 -Imdur on hold  Chronic pain syndrome -due to ongoing peripheral neuropathy and apparent lumbar compression fracture -on methadone, norco, + morphine sulfate at home but currently holding all sedative medications due to acute metabolic encephalopathy - provide w/ low dose scheduled narcotic to prevent withdrawal   Diabetes mellitus type II -10/20 A1c 6.6 - follow CBG w/o change today   HYPERCHOLESTEROLEMIA -Statin on hold as pt NPO   Peripheral neuropathy -Preadmission narcotics and gabapentin on hold  BLINDNESS -Secondary to diabetic retinopathy  Goals of care -Palliative Care working w/ family   Code Status: FULL Family Communication: no family present at time of exam Disposition Plan: SDU   Consultants: [facility] + EP ID [facility]   Procedures: 10/21 echocardiogram EF= 30% - 35%. Diffuse hypokinesis.-(grade 1 diastolic dysfunction)   Antibiotics: Zosyn 10/20>> 10/22 Vancomycin 10/20>> 10/22 Ceftriaxone 10/22 >  DVT prophylaxis: lovenox   Objective: Blood pressure 144/72, pulse 95, temperature 98.2 F (36.8 C), temperature source Oral, resp. rate 30, height  5\' 1"  (1.549 m),  weight 81.5 kg (179 lb 10.8 oz), SpO2 98 %.  Intake/Output Summary (Last 24 hours) at 05/31/15 1707 Last data filed at 05/31/15 1528  Gross per 24 hour  Intake    390 ml  Output   1575 ml  Net  -1185 ml   Exam: General: No acute respiratory distress - obtunded  Lungs: Clear to auscultation bilaterally without wheezes or crackles Cardiovascular: Regular rate without gallop or rub normal S1 and S2 Abdomen: Nontender, nondistended, soft, bowel sounds positive, no rebound, no ascites, no appreciable mass Extremities: No significant cyanosis, clubbing, or edema bilateral lower extremities  Data Reviewed: Basic Metabolic Panel:  Recent Labs Lab 05/27/15 0920 05/27/15 1650 05/28/15 0410 05/29/15 0653 05/29/15 1145 05/30/15 0420 05/31/15 0358  NA 138  --  143 149*  --  149* 157*  K 3.3*  --  4.0 2.8* 4.0 3.4* 3.7  CL 99*  --  105 103  --  107 116*  CO2 26  --  24 29  --  30 32  GLUCOSE 238*  --  153* 200*  --  163* 180*  BUN 19  --  23* 20  --  32* 35*  CREATININE 1.27*  --  1.30* 1.48*  --  1.20 1.28*  CALCIUM 9.1  --  8.4* 8.6*  --  8.6* 9.0  MG  --  2.1  --  2.3 2.5* 2.6* 2.9*  PHOS  --  3.4  --   --   --   --   --     CBC:  Recent Labs Lab 05/27/15 0920 05/28/15 0410 05/29/15 0047 05/30/15 0420 05/31/15 0358  WBC 7.5 8.3 7.1 7.4 7.6  NEUTROABS 6.7  --  5.8 5.5 5.4  HGB 15.2 15.6 17.9* 17.6* 16.9  HCT 45.8 47.6 53.3* 53.0* 53.2*  MCV 92.0 93.0 92.1 93.3 95.2  PLT 126* 90* 80* 92* 113*    Liver Function Tests:  Recent Labs Lab 05/27/15 0920 05/28/15 0410 05/29/15 0653 05/30/15 0420 05/31/15 0358  AST 26 62* 70* 54* 36  ALT 23 49 59 45 35  ALKPHOS 69 55 52 45 42  BILITOT 0.8 0.7 1.0 1.2 0.9  PROT 7.3 6.1* 6.5 6.6 6.1*  ALBUMIN 3.9 2.9* 2.6* 2.6* 2.6*    Recent Labs Lab 05/29/15 0855  LIPASE 37  AMYLASE 53   Cardiac Enzymes:  Recent Labs Lab 05/28/15 1953 05/29/15 0653  TROPONINI 0.29* 1.13*    CBG:  Recent Labs Lab 05/30/15 2309  05/31/15 0417 05/31/15 0814 05/31/15 1141 05/31/15 1531  GLUCAP 196* 205* 159* 141* 158*    Recent Results (from the past 240 hour(s))  Blood Culture (routine x 2)     Status: None   Collection Time: 05/27/15  9:20 AM  Result Value Ref Range Status   Specimen Description BLOOD LEFT ANTECUBITAL  Final   Special Requests BOTTLES DRAWN AEROBIC AND ANAEROBIC 5ML  Final   Culture  Setup Time   Final    GRAM POSITIVE COCCI IN CLUSTERS IN BOTH AEROBIC AND ANAEROBIC BOTTLES CRITICAL RESULT CALLED TO, READ BACK BY AND VERIFIED WITH: Jodi Marble RN 2250 05/27/15 A BROWNING    Culture STAPHYLOCOCCUS AUREUS  Final   Report Status 05/29/2015 FINAL  Final   Organism ID, Bacteria STAPHYLOCOCCUS AUREUS  Final      Susceptibility   Staphylococcus aureus - MIC*    CIPROFLOXACIN <=0.5 SENSITIVE Sensitive     ERYTHROMYCIN 0.5 SENSITIVE Sensitive  GENTAMICIN <=0.5 SENSITIVE Sensitive     OXACILLIN <=0.25 SENSITIVE Sensitive     TETRACYCLINE <=1 SENSITIVE Sensitive     VANCOMYCIN 1 SENSITIVE Sensitive     TRIMETH/SULFA <=10 SENSITIVE Sensitive     CLINDAMYCIN <=0.25 SENSITIVE Sensitive     RIFAMPIN <=0.5 SENSITIVE Sensitive     Inducible Clindamycin NEGATIVE Sensitive     * STAPHYLOCOCCUS AUREUS  Blood Culture (routine x 2)     Status: None   Collection Time: 05/27/15  9:39 AM  Result Value Ref Range Status   Specimen Description BLOOD RIGHT ANTECUBITAL  Final   Special Requests BOTTLES DRAWN AEROBIC AND ANAEROBIC 5ML  Final   Culture  Setup Time   Final    GRAM POSITIVE COCCI IN CLUSTERS IN BOTH AEROBIC AND ANAEROBIC BOTTLES CRITICAL RESULT CALLED TO, READ BACK BY AND VERIFIED WITH: R TARWATER RN 2250 05/27/15 A BROWNING    Culture   Final    STAPHYLOCOCCUS AUREUS SUSCEPTIBILITIES PERFORMED ON PREVIOUS CULTURE WITHIN THE LAST 5 DAYS.    Report Status 05/29/2015 FINAL  Final  Urine culture     Status: None   Collection Time: 05/27/15  9:52 AM  Result Value Ref Range Status   Specimen  Description URINE, CLEAN CATCH  Final   Special Requests NONE  Final   Culture NO GROWTH 1 DAY  Final   Report Status 05/28/2015 FINAL  Final  Respiratory virus panel     Status: None   Collection Time: 05/27/15 12:43 PM  Result Value Ref Range Status   Source - RVPAN NASAL SWAB  Corrected   Respiratory Syncytial Virus A Negative Negative Final   Respiratory Syncytial Virus B Negative Negative Final   Influenza A Negative Negative Final   Influenza B Negative Negative Final   Parainfluenza 1 Negative Negative Final   Parainfluenza 2 Negative Negative Final   Parainfluenza 3 Negative Negative Final   Metapneumovirus Negative Negative Final   Rhinovirus Negative Negative Final   Adenovirus Negative Negative Final    Comment: (NOTE) Performed At: [facility] [address] [place], [place] [id number] Lindon Romp MD [id number]   MRSA PCR Screening     Status: None   Collection Time: 05/27/15  4:25 PM  Result Value Ref Range Status   MRSA by PCR NEGATIVE NEGATIVE Final    Comment:        The GeneXpert MRSA Assay (FDA approved for NASAL specimens only), is one component of a comprehensive MRSA colonization surveillance program. It is not intended to diagnose MRSA infection nor to guide or monitor treatment for MRSA infections.   Culture, blood (routine x 2)     Status: None (Preliminary result)   Collection Time: 05/29/15 11:45 AM  Result Value Ref Range Status   Specimen Description BLOOD RIGHT ANTECUBITAL  Final   Special Requests BOTTLES DRAWN AEROBIC AND ANAEROBIC 10CC  Final   Culture NO GROWTH 2 DAYS  Final   Report Status PENDING  Incomplete  Culture, blood (routine x 2)     Status: None (Preliminary result)   Collection Time: 05/29/15 12:00 PM  Result Value Ref Range Status   Specimen Description BLOOD LEFT ANTECUBITAL  Final   Special Requests BOTTLES DRAWN AEROBIC AND ANAEROBIC 10CC  Final   Culture NO GROWTH 2 DAYS  Final   Report Status  PENDING  Incomplete     Studies:   Recent x-ray studies have been reviewed in detail by the Attending Physician  Scheduled Meds:  Scheduled Meds: . cefTRIAXone (ROCEPHIN)  IV  2 g Intravenous Q24H  . enoxaparin (LOVENOX) injection  40 mg Subcutaneous Q24H  . insulin aspart  0-9 Units Subcutaneous 6 times per day  . metoCLOPramide (REGLAN) injection  5 mg Intravenous 4 times per day    Time spent on care of this patient: 35 mins   Eleshia Wooley T , MD   [facility] Office  [phone] Pager - Text Page per Shea Evans as per below:  On-Call/Text Page:      [email or web address]      password [email or web address]  If 7PM-7AM, please contact night-coverage [email or web address] Password [email or web address] 05/31/2015, 5:07 PM   LOS: 4 days

## 2015-05-31 NOTE — Progress Notes (Signed)
Subjective: Pt still  Nonverbal, lethargic. Only grunts and groans. No po intake. ID has seen pt and referenced need to remove pacemaker otherwise sepsis cannot resolve. Cardiology has weighed in and feel that removal of pacemaker as well as temporary pacer, 6 weeks of abx in NSF and the replacement of permanent pacemaker too difficult for pt given his co-morbidities and underlying frailty, thus not recommended. Spoke to [place] at length about her 2 options as I see it which are : 1. Comfort care and transfer to hospice in-patient or 2. Moving forward with more aggressive approach with removal of pacemaker and 6-8 weeks IV abx in NSF. Did share candidly given pt's baseline of poor functional status, almost WC bound at home prior to admission, that I did not feel he could withstand this coarse of treatment and that it certainly would not return him to his prior status where he could come home. Leda Gauze in agreement but wants to discuss this with his cardiologist on Monday, Dr. Burt Knack as well as pt's 2 brothers before she makes this final decision Objective: Vital signs in last 24 hours: Temp:  [98.4 F (36.9 C)-99.6 F (37.6 C)] 98.4 F (36.9 C) (10/24 0808) Pulse Rate:  [70-101] 100 (10/24 0805) Resp:  [26-36] 34 (10/24 0805) BP: (158-169)/(69-81) 158/71 mmHg (10/24 0805) SpO2:  [93 %-99 %] 96 % (10/24 0805) Weight:  [81.5 kg (179 lb 10.8 oz)] 81.5 kg (179 lb 10.8 oz) (10/24 0441) Wt Readings from Last 1 Encounters:  05/31/15 81.5 kg (179 lb 10.8 oz)    Intake/Output from previous day: Jun 17, 2023 0701 - 10/24 0700 In: 570 [I.V.:570] Out: 1925 [Urine:1600; Emesis/NG output:325] Intake/Output this shift:    Gen: Acutely ill appearing elderly man Resp: Increased work of breathing Cardiac: Tachy Neuro: Lethargic, non-verbal. Only grunts and groans  Lab Results:  Recent Labs  Jun 17, 2015 0420 05/31/15 0358  WBC 7.4 7.6  HGB 17.6* 16.9  HCT 53.0* 53.2*  PLT 92* 113*   BMET  Recent Labs  06-17-15 0420 05/31/15 0358  NA 149* 157*  K 3.4* 3.7  CL 107 116*  CO2 30 32  GLUCOSE 163* 180*  BUN 32* 35*  CREATININE 1.20 1.28*  CALCIUM 8.6* 9.0    Studies/Results: Dg Abd 2 Views  June 17, 2015  CLINICAL DATA:  Ileus EXAM: ABDOMEN - 2 VIEW COMPARISON:  05/28/2015 FINDINGS: Enteric tube terminates in the distal second portion of the duodenum. Nonobstructive bowel gas pattern. No evidence of free air on the lateral decubitus view. Degenerative changes of the lumbar spine. IMPRESSION: Enteric tube terminates in the distal second portion the duodenum. No evidence of small bowel obstruction or free air. Electronically Signed   By: [name]   On: 2015/06/17 10:02    Medications: I have reviewed the patient's current medications.  Assessment/Plan Wife to discuss with pt's cardiologist current clinical condition as well as discussion with Palliative Medicine on Monday, 10/24 before making final move to comfort care.   Continue partial code in hospital. Palliative Medicine to finalize DNR with spouse after she discusses with physician and family hopefully Monday If full comfort care, DNR status reached, feel pt meets hospice in-pt criteria. Have discussed this with spouse. She was hopeful to take him home but feel that his current condition and with her limited support this would be unmanagable  LOS: 4 days    Time In: 1400   Time Out: 1500 Total Time: 60 min.   Mark French Mark French 05/31/2015, 11:25 AM

## 2015-05-31 NOTE — Progress Notes (Signed)
Daily Progress Note   Patient Name: Mark French       Date: 05/31/2015 DOB: 1945/09/20  Age: 69 y.o. MRN#: [id number] Attending Physician: Cherene Altes, MD Primary Care Physician: [name], MD Admit Date: 05/27/2015  Reason for Consultation/Follow-up: Establishing goals of care  Subjective: Mark French is lying in bed with NRB and very confused, nonverbal but moans occasionally and appears fearful at times. Wife is at bedside. Discussion about cardiology opinion that he would not tolerate procedure and without procedure the infection is very difficult to treat. She realizes that prognosis is grim and that time is likely very short. She talks about previous discussion about regarding hospice facility and focusing on comfort - I tell her that I do believe this is his best option. She is awaiting to her back from Dr. Burt Knack as she respects his opinion and gather info from EP today. She says that his brothers are coming to visit and they will discuss these options although she does not know what time they will be here. I offer to help them discuss and she will call me if they come before 5pm so I can assist. I provided emotional support.   Interval Events: Worsening resp status;  Length of Stay: 4 days  Current Medications: Scheduled Meds:  . cefTRIAXone (ROCEPHIN)  IV  2 g Intravenous Q24H  . insulin aspart  0-9 Units Subcutaneous 6 times per day  . metoCLOPramide (REGLAN) injection  5 mg Intravenous 4 times per day    Continuous Infusions: . dextrose 5 % with KCl 20 mEq / L 20 mEq (05/31/15 1822)    PRN Meds: [DISCONTINUED] acetaminophen **OR** acetaminophen, hydrALAZINE, morphine injection, promethazine  Physical Exam: Physical Exam  Constitutional: He appears  well-developed.  HENT:  Head: Normocephalic.  NGT in place  Pulmonary/Chest: He is in respiratory distress.  Abdominal: Soft. He exhibits distension.  Skin: Skin is warm.  Psychiatric:  Restless                Vital Signs: BP 144/72 mmHg  Pulse 95  Temp(Src) 98.2 F (36.8 C) (Oral)  Resp 30  Ht 5\' 1"  (1.549 m)  Wt 81.5 kg (179 lb 10.8 oz)  BMI 33.97 kg/m2  SpO2 98% SpO2: SpO2: 98 % O2 Device: O2 Device: Nasal Cannula O2 Flow  Rate: O2 Flow Rate (L/min): 6 L/min  Intake/output summary:   Intake/Output Summary (Last 24 hours) at 05/31/15 1826 Last data filed at 05/31/15 1528  Gross per 24 hour  Intake    360 ml  Output   1575 ml  Net  -1215 ml   Baseline Weight: Weight: 86.183 kg (190 lb) Most recent weight: Weight: 81.5 kg (179 lb 10.8 oz)       Palliative Assessment/Data: Flowsheet Rows        Most Recent Value   Intake Tab    Referral Department  Hospitalist   Unit at Time of Referral  Intermediate Care Unit   Palliative Care Primary Diagnosis  Sepsis/Infectious Disease   Date Notified  05/27/15   Palliative Care Type  New Palliative care   Reason for referral  Clarify Goals of Care   Date of Admission  05/27/15   Date first seen by Palliative Care  05/28/15   # of days Palliative referral response time  1 Day(s)   # of days IP prior to Palliative referral  0   Clinical Assessment    Palliative Performance Scale Score  20%   Pain Max last 24 hours  Not able to report   Pain Min Last 24 hours  Not able to report   Dyspnea Max Last 24 Hours  Not able to report   Dyspnea Min Last 24 hours  Not able to report   Nausea Max Last 24 Hours  Not able to report   Nausea Min Last 24 Hours  Not able to report   Anxiety Max Last 24 Hours  Not able to report   Anxiety Min Last 24 Hours  Not able to report   Other Max Last 24 Hours  Not able to report   Psychosocial & Spiritual Assessment    Social Work Systems developer patient/family wishes with healthcare  team, Advance care planning, Education on Hospice   Palliative Care Outcomes    Patient/Family meeting held?  Yes   Who was at the meeting?  -- [wife, [place] goals of care, Provided end of life care assistance, Provided advance care planning, Counseled regarding hospice   Patient/Family wishes: Interventions discontinued/not started   Mechanical Ventilation, PEG, Trach, Vasopressors   Palliative Care follow-up planned  Yes, Facility      Additional Data Reviewed: Recent Labs     05/30/15  0420  05/31/15  0358  WBC  7.4  7.6  HGB  17.6*  16.9  PLT  92*  113*  NA  149*  157*  BUN  32*  35*  CREATININE  1.20  1.28*     Problem List:  Patient Active Problem List   Diagnosis Date Noted  . Paroxysmal atrial fibrillation ([place]) 05/30/2015  . Hypoxia   . Pressure ulcer 05/29/2015  . Cardiomyopathy, ischemic-30-35% 05/29/2015  . Hospice care patient- currently limited code 05/29/2015  . Ileus ([place])   . Staphylococcus aureus bacteremia with sepsis ([place])   . Pacemaker infection ([place])   . Adynamic ileus ([place])   . Abnormal x-ray of abdomen   . Hypokalemia   . CHB (complete heart block) ([place])   . Acute respiratory distress (HCC)   . Controlled type 2 diabetes mellitus with complication, without long-term current use of insulin ([place])   . Palliative care encounter   . Aspiration pneumonia ([place])   . Controlled diabetes mellitus type 2 with complications ([place])   .  HLD (hyperlipidemia)   . Cardiac pacemaker in situ   . Peripheral polyneuropathy ([place])   . AKI (acute kidney injury) ([place]) 05/27/2015  . Thrombocytopenia ([place]) 05/27/2015  . FUO (fever of unknown origin) 05/27/2015  . Metabolic encephalopathy 08/09/7251  . Pulmonary HTN ([place]) 05/27/2015  . Low back pain 03/23/2015  . PAD (peripheral artery disease) ([place]) 03/03/2015  . DNR (do not resuscitate) 10/29/2013  . Diabetes mellitus type II, uncontrolled ([place]) 10/25/2013  . NSTEMI  (non-ST elevated myocardial infarction) ([place]) 10/25/2013  . Chronic pain syndrome 09/05/2013  . Pacemaker- [place]- Jan 2015 08/27/2013  . Hearing loss 08/21/2013  . Obstructive lung disease ([place]) 02/12/2013  . Chronic kidney disease, stage III (moderate) 10/15/2012  . Prostatism 06/25/2012  . Screening for prostate cancer 04/16/2012  . Unstable angina ([place]) 01/12/2012  . Peripheral vascular disease ([place]) 08/17/2010  . PROLIFERATIVE DIABETIC RETINOPATHY 03/09/2009  . HYPERCHOLESTEROLEMIA 09/28/2008  . HYPOGONADISM 09/23/2008  . Iron deficiency anemia 09/23/2008  . DEPRESSION 09/23/2008  . Peripheral neuropathy ([place]) 09/23/2008  . MYOCARDIAL INFARCTION, HX OF 09/23/2008  . Chronic systolic heart failure ([place]) 09/23/2008  . ALLERGIC RHINITIS 09/23/2008  . CERVICAL RADICULOPATHY, LEFT 09/23/2008  . CEREBROVASCULAR ACCIDENT, HX OF 09/23/2008  . BLINDNESS 05/21/2008  . HTN (hypertension) 03/11/2007  . CAD of vein bypass graft 03/11/2007     Palliative Care Assessment & Plan    1.Code Status:  DNR    Code Status Orders        Start     Ordered   05/27/15 1526  Limited resuscitation (code)   Continuous    Question Answer Comment  In the event of cardiac or respiratory ARREST: Initiate Code Blue, Call Rapid Response Yes   In the event of cardiac or respiratory ARREST: Perform CPR No   In the event of cardiac or respiratory ARREST: Perform Intubation/Mechanical Ventilation No   In the event of cardiac or respiratory ARREST: Use NIPPV/BiPAp only if indicated Yes   In the event of cardiac or respiratory ARREST: Administer ACLS medications if indicated Yes   In the event of cardiac or respiratory ARREST: Perform Defibrillation or Cardioversion if indicated No      05/27/15 1525       2. Goals of Care:  Wife is considering full comfort care.   Limitations on Scope of Treatment: Awaiting cardiology in-put re: pacemaker removal  Desire for further Chaplaincy  support:no  Psycho-social Needs: Caregiving  Support/Resources  3. Symptom Management:      1.Pain: Difficult to assess even though pt is moaning, but no grimacing. Cont prn morphine.       2. Dyspnea: Cont targeted 02 therapy.  4. Palliative Prophylaxis:   Rectal tube in place  5. Prognosis: Weeks or less likely - especially with comfort care.   6. Discharge Planning:  Recommend hospice facility.    Thank you for allowing the Palliative Medicine Team to assist in the care of this patient.  Time In: 1030 Time Out: 1100 Total Time 36min Prolonged Time Billed  no         Pershing Proud, NP  66/44/0347, 6:26 PM  Please contact Palliative Medicine Team phone at [phone] for questions and concerns.

## 2015-06-01 DIAGNOSIS — G9341 Metabolic encephalopathy: Secondary | ICD-10-CM

## 2015-06-01 DIAGNOSIS — R69 Illness, unspecified: Secondary | ICD-10-CM

## 2015-06-01 DIAGNOSIS — IMO0002 Reserved for concepts with insufficient information to code with codable children: Secondary | ICD-10-CM | POA: Diagnosis present

## 2015-06-01 DIAGNOSIS — Z515 Encounter for palliative care: Secondary | ICD-10-CM

## 2015-06-01 LAB — COMPREHENSIVE METABOLIC PANEL
ALBUMIN: 2.5 g/dL — AB (ref 3.5–5.0)
ALK PHOS: 39 U/L (ref 38–126)
ALT: 29 U/L (ref 17–63)
ANION GAP: 10 (ref 5–15)
AST: 32 U/L (ref 15–41)
BUN: 31 mg/dL — ABNORMAL HIGH (ref 6–20)
CALCIUM: 8.8 mg/dL — AB (ref 8.9–10.3)
CHLORIDE: 115 mmol/L — AB (ref 101–111)
CO2: 34 mmol/L — AB (ref 22–32)
Creatinine, Ser: 1.1 mg/dL (ref 0.61–1.24)
GFR calc non Af Amer: >60 mL/min (ref >60)
GLUCOSE: 212 mg/dL — AB (ref 65–99)
Potassium: 3.6 mmol/L (ref 3.5–5.1)
SODIUM: 159 mmol/L — AB (ref 135–145)
Total Bilirubin: 1 mg/dL (ref 0.3–1.2)
Total Protein: 6.1 g/dL — ABNORMAL LOW (ref 6.5–8.1)

## 2015-06-01 LAB — GLUCOSE, CAPILLARY
GLUCOSE-CAPILLARY: 175 mg/dL — AB (ref 65–99)
GLUCOSE-CAPILLARY: 140 mg/dL — AB (ref 65–99)
GLUCOSE-CAPILLARY: 151 mg/dL — AB (ref 65–99)
GLUCOSE-CAPILLARY: 158 mg/dL — AB (ref 65–99)
GLUCOSE-CAPILLARY: 186 mg/dL — AB (ref 65–99)
Glucose-Capillary: 167 mg/dL — ABNORMAL HIGH (ref 65–99)

## 2015-06-01 LAB — CBC
HCT: 54.7 % — ABNORMAL HIGH (ref 39.0–52.0)
Hemoglobin: 17.4 g/dL — ABNORMAL HIGH (ref 13.0–17.0)
MCH: 30.4 pg (ref 26.0–34.0)
MCHC: 31.8 g/dL (ref 30.0–36.0)
MCV: 95.6 fL (ref 78.0–100.0)
PLATELETS: 108 10*3/uL — AB (ref 150–400)
RBC: 5.72 MIL/uL (ref 4.22–5.81)
RDW: 14.5 % (ref 11.5–15.5)
WBC: 7.7 10*3/uL (ref 4.0–10.5)

## 2015-06-01 MED ORDER — BISACODYL 10 MG RE SUPP
10.0000 mg | Freq: Once | RECTAL | Status: AC
  Administered 2015-06-01: 10 mg via RECTAL
  Filled 2015-06-01: qty 1

## 2015-06-01 MED ORDER — ONDANSETRON HCL 4 MG/2ML IJ SOLN
4.0000 mg | Freq: Four times a day (QID) | INTRAMUSCULAR | Status: DC | PRN
  Administered 2015-06-02: 4 mg via INTRAVENOUS
  Filled 2015-06-01: qty 2

## 2015-06-01 NOTE — Care Management Important Message (Signed)
Important Message  Patient Details  Name: Mark French MRN: [id number] Date of Birth: Oct 09, 1945   Medicare Important Message Given:  Yes-second notification given    Nathen May 06/01/2015, 1:54 PM

## 2015-06-01 NOTE — Consult Note (Signed)
[facility] Liaison:  Received request from [name] for family interest in [facility]. Chart reviewed and met with patient's spouse and brother [name] in conference room to confirm interest, answer questions, complete paper work and offer support. Patient is former [facility] patient so family familiar with hospice services. Spouse completed paper work. Dr. Orpah Melter to assume care per family request as he was patient's previous hospice MD and they are pleased he is available. They are agreeable to transfer to [facility], 06/02/15. CSW and MD aware of above.   Please fax discharge summary to [phone].  RN please call report to [phone].  Thank you.  Mark French, [place]

## 2015-06-01 NOTE — Progress Notes (Addendum)
Daily Progress Note   Patient Name: Mark French       Date: 06/01/2015 DOB: [birth year]  Age: 69 y.o. MRN#: [id number] Attending Physician: Allie Bossier, MD Primary Care Physician: [name], MD Admit Date: 05/27/2015  Reason for Consultation/Follow-up: Establishing goals of care  Subjective: Met with Mark French along with his 2 brothers and sister-in-law. They are all very supportive of Mark French and her decisions. Dr. Sherral Hammers present during discussion and explained that cardiology has said that removing the pacemaker is too dangerous for Mark French. Unfortunately this leaves little option as this infection is not treatable with the infected pacemaker continuing in his body. We presented them with that continued treatment will not be beneficial to put Mark French through continued aggressive therapies (labs, IVs, tubes, monitoring wires) as this will not change his outcome. We discussed the option of shifting towards a comfort approach allowing Mark French a natural and peaceful death and that he would be better served in a hospice facility for this care. Mark French struggles with the idea of not continuing IVF and antibiotics but knows her husband would not want to continue in this state - family supports he would not want this. We did talk briefly about home with hospice but Mark French is not in a position to provide the physical care he would need at home even with hospice support. Mark French has decided that hospice facility is the best place for her husband. Emotional support provided.     Length of Stay: 5 days  Current Medications: Scheduled Meds:  . cefTRIAXone (ROCEPHIN)  IV  2 g Intravenous Q24H  . insulin aspart  0-9 Units Subcutaneous 6 times per day  . metoCLOPramide (REGLAN) injection  5 mg  Intravenous 4 times per day    Continuous Infusions: . dextrose 5 % with KCl 20 mEq / L 20 mEq (06/01/15 1200)    PRN Meds: [DISCONTINUED] acetaminophen **OR** acetaminophen, hydrALAZINE, morphine injection, ondansetron (ZOFRAN) IV, promethazine  Physical Exam: Physical Exam  Constitutional: He appears well-developed.  HENT:  Head: Normocephalic and atraumatic.  NGT in place  Pulmonary/Chest: No respiratory distress.  Abdominal: Soft. He exhibits distension.  Neurological: He is disoriented.  Skin: Skin is warm.  Psychiatric: He is agitated.  Restless  Vital Signs: BP 147/66 mmHg  Pulse 95  Temp(Src) 97.2 F (36.2 C) (Axillary)  Resp 25  Ht 5' 1"  (1.549 m)  Wt 81 kg (178 lb 9.2 oz)  BMI 33.76 kg/m2  SpO2 96% SpO2: SpO2: 96 % O2 Device: O2 Device: Nasal Cannula O2 Flow Rate: O2 Flow Rate (L/min): 6 L/min  Intake/output summary:   Intake/Output Summary (Last 24 hours) at 06/01/15 1348 Last data filed at 06/01/15 1245  Gross per 24 hour  Intake   1170 ml  Output   1775 ml  Net   -605 ml   Baseline Weight: Weight: 86.183 kg (190 lb) Most recent weight: Weight: 81 kg (178 lb 9.2 oz)       Palliative Assessment/Data: Flowsheet Rows        Most Recent Value   Intake Tab    Referral Department  Hospitalist   Unit at Time of Referral  Intermediate Care Unit   Palliative Care Primary Diagnosis  Sepsis/Infectious Disease   Date Notified  05/27/15   Palliative Care Type  New Palliative care   Reason for referral  Clarify Goals of Care   Date of Admission  05/27/15   Date first seen by Palliative Care  05/28/15   # of days Palliative referral response time  1 Day(s)   # of days IP prior to Palliative referral  0   Clinical Assessment    Palliative Performance Scale Score  20%   Pain Max last 24 hours  Not able to report   Pain Min Last 24 hours  Not able to report   Dyspnea Max Last 24 Hours  Not able to report   Dyspnea Min Last 24 hours  Not  able to report   Nausea Max Last 24 Hours  Not able to report   Nausea Min Last 24 Hours  Not able to report   Anxiety Max Last 24 Hours  Not able to report   Anxiety Min Last 24 Hours  Not able to report   Other Max Last 24 Hours  Not able to report   Psychosocial & Spiritual Assessment    Social Work Plan of Care  Clarified patient/family wishes with healthcare team, Advance care planning, Education on Hospice   Palliative Care Outcomes    Patient/Family meeting held?  Yes   Who was at the meeting?  -- [wife, [place] goals of care, Provided end of life care assistance, Provided advance care planning, Counseled regarding hospice   Patient/Family wishes: Interventions discontinued/not started   Mechanical Ventilation, PEG, Trach, Vasopressors   Palliative Care follow-up planned  Yes, Facility      Additional Data Reviewed: Recent Labs     05/31/15  0358  06/01/15  0325  WBC  7.6  7.7  HGB  16.9  17.4*  PLT  113*  108*  NA  157*  159*  BUN  35*  31*  CREATININE  1.28*  1.10     Problem List:  Patient Active Problem List   Diagnosis Date Noted  . Paroxysmal atrial fibrillation ([place]) 05/30/2015  . Hypoxia   . Pressure ulcer 05/29/2015  . Cardiomyopathy, ischemic-30-35% 05/29/2015  . Hospice care patient- currently limited code 05/29/2015  . Ileus ([place])   . Staphylococcus aureus bacteremia with sepsis ([place])   . Pacemaker infection ([place])   . Adynamic ileus ([place])   . Abnormal x-ray of abdomen   . Hypokalemia   . CHB (complete  heart block) ([place])   . Acute respiratory distress (HCC)   . Controlled type 2 diabetes mellitus with complication, without long-term current use of insulin ([place])   . Palliative care encounter   . Aspiration pneumonia ([place])   . Controlled diabetes mellitus type 2 with complications ([place])   . HLD (hyperlipidemia)   . Cardiac pacemaker in situ   . Peripheral polyneuropathy ([place])   . AKI (acute kidney  injury) ([place]) 05/27/2015  . Thrombocytopenia ([place]) 05/27/2015  . FUO (fever of unknown origin) 05/27/2015  . Metabolic encephalopathy 08/09/7251  . Pulmonary HTN ([place]) 05/27/2015  . Low back pain 03/23/2015  . PAD (peripheral artery disease) ([place]) 03/03/2015  . DNR (do not resuscitate) 10/29/2013  . Diabetes mellitus type II, uncontrolled ([place]) 10/25/2013  . NSTEMI (non-ST elevated myocardial infarction) ([place]) 10/25/2013  . Chronic pain syndrome 09/05/2013  . Pacemaker- [place]- Jan 2015 08/27/2013  . Hearing loss 08/21/2013  . Obstructive lung disease ([place]) 02/12/2013  . Chronic kidney disease, stage III (moderate) 10/15/2012  . Prostatism 06/25/2012  . Screening for prostate cancer 04/16/2012  . Unstable angina ([place]) 01/12/2012  . Peripheral vascular disease ([place]) 08/17/2010  . PROLIFERATIVE DIABETIC RETINOPATHY 03/09/2009  . HYPERCHOLESTEROLEMIA 09/28/2008  . HYPOGONADISM 09/23/2008  . Iron deficiency anemia 09/23/2008  . DEPRESSION 09/23/2008  . Peripheral neuropathy ([place]) 09/23/2008  . MYOCARDIAL INFARCTION, HX OF 09/23/2008  . Chronic systolic heart failure ([place]) 09/23/2008  . ALLERGIC RHINITIS 09/23/2008  . CERVICAL RADICULOPATHY, LEFT 09/23/2008  . CEREBROVASCULAR ACCIDENT, HX OF 09/23/2008  . BLINDNESS 05/21/2008  . HTN (hypertension) 03/11/2007  . CAD of vein bypass graft 03/11/2007     Palliative Care Assessment & Plan    1.Code Status:  DNR    Code Status Orders        Start     Ordered   05/27/15 1526  Limited resuscitation (code)   Continuous    Question Answer Comment  In the event of cardiac or respiratory ARREST: Initiate Code Blue, Call Rapid Response Yes   In the event of cardiac or respiratory ARREST: Perform CPR No   In the event of cardiac or respiratory ARREST: Perform Intubation/Mechanical Ventilation No   In the event of cardiac or respiratory ARREST: Use NIPPV/BiPAp only if indicated Yes   In the event of cardiac or respiratory  ARREST: Administer ACLS medications if indicated Yes   In the event of cardiac or respiratory ARREST: Perform Defibrillation or Cardioversion if indicated No      05/27/15 1525       2. Goals of Care:  Transitioning to full comfort care.   Limitations on Scope of Treatment: Will D/C NGT today. Antibiotics/IVF to d/c upon d/c to hospice.   Desire for further Chaplaincy support:no  Psycho-social Needs: Caregiving  Support/Resources  3. Symptom Management:      1.Pain: Difficult to assess even though pt is moaning, but no grimacing. Cont prn morphine.       2. Dyspnea: Cont targeted 02 therapy.       3. I will give suppository (RN reports a few smears), RN to check for disimpaction. Ordered zofran prn (may give          scheduled if needed). Will continue reglan. D/C NGT later today after interventions and hopefully after BM.   4. Palliative Prophylaxis:   Suppository ordered.   5. Prognosis: Days to 1-2 weeks.   6. Discharge Planning:  Recommend hospice facility.    Thank you for allowing  the Palliative Medicine Team to assist in the care of this patient.  Time In: 1010 Time Out: 1100 Total Time 49mn Prolonged Time Billed  no         APershing Proud NP  127/25/3664 1:48 PM  Please contact Palliative Medicine Team phone at [phone] questions and concerns.

## 2015-06-01 NOTE — Discharge Summary (Signed)
Physician Discharge Summary  Mark French LZJ:[id number] DOB: Aug 29, 1945 DOA: 05/27/2015  PCP: [name], MD  Admit date: 05/27/2015 Discharge date: 06/01/2015  Time spent: 90 minutes  Recommendations for Outpatient Follow-up:  Multisystem organ failure -See below  Positive MSSA Staph bacteremia/CAP -Blood cultures 2/2 positive MSSA staph aureus  -CXR; LLL pneumonia? -Continue ceftriaxone per ID (see below) until discharge -Continue emesis so continue IV Reglan and low-dose IV Phenergan (no Zofran in setting of prolonged QT interval) -Cardiology consult; removal of Pacemaker impractical -Hospice care  Acute respiratory failure with hypoxemia/likely aspiration pneumonia -Continue supportive care with oxygen-due to recent emesis and altered mentation not an appropriate candidate for BiPAP at this juncture -Titrate O2 to maintain SPO2> 93%  Bilateral pleural effusion -10/22 PCXR shows worsening pulmonary edema and bilateral pleural effusion  Metabolic encephalopathy -Suspect related to fever and underlying infectious processes -NPO until more alert  AKI ([place]) on Chronic kidney disease, stage III (moderate) -Cr responded to hydration.  Generalized ileus/Ogilvie syndrome? -Most likely multifactorial to include chronic high-dose narcotic use, MSSA Staph bacteremia  -Continue NG tube; low continuous suction -NOTE; patient seen in the past by Dr. Delfin Edis ([facility]) for chronic abdominal pain. Last seen 05/19/2014. Per her note patient has never had colonoscopy. -GI consulted; recommend continuing NG tube and rectal tube -Remove all tubes prior to discharge-Hospice care  Thrombocytopenia ([place]) -New thrombocytopenia with platelets< 100,000, trending up -Suspect related to underlying infectious process and acute inflammation  Hypokalemia -Potassium goal> 4 -Hospice care  HTN (hypertension) -Relatively hypotensive hold BP medication  -Hydralazine IV 5 mg PRN SBP>  160 or DBP> 100  CAD of autologous vein bypass graft -Patient somnolent/obtunded; troponin trending up cardiology consulted -Preadmission statin, Plavix, Imdur as well as carvedilol on hold while NPO  Chronic systolic and diastolic heart failure [facility]) -Patient's cardiologist is Dr.Michael Burt Knack. Last seen 12/29/2014 -Ejection fraction 40% last echocardiogram March 2015 revealed severe hypokinesis of the inferior wall with left ventricular dilatation -Echocardiogram 10/21; slightly worsening EF see results below -Strict in and out since admission - 1.3 L  -Daily a.m. Weight; 10/25 bed weight= 81 kg -Hospice care  Pacemaker -Currently actively pacing -MSSA staph bacteremia pacer will need to be removed if possible; removal of pacer and wire impractical per cardiology  Pulmonary HTN (HCC)/COPD -Severe with most recent reading of 83 mmHg in 2015 -Imdur on hold  Chronic pain syndrome -due to ongoing peripheral neuropathy and apparent lumbar compression fracture -Hospice care continue pain meds for comfort  Diabetes mellitus type II, controlled ([place]) -Current CBGs greater than 200 and likely related to acute infectious processes -10/20 Hemoglobin A1c= 6.6 -Sensitive SSI   HYPERCHOLESTEROLEMIA -Statin on hold  Iron deficiency anemia -Current hemoglobin 15.2 with baseline hemoglobin around 13 consistent with hemoconcentration and volume depletion  Peripheral neuropathy (HCC) -Preadmission narcotics and gabapentin on hold  BLINDNESS -Secondary to diabetic retinopathy  DVT  -Patient with palpable cords left calf, bilateral poor circulation -lower extremity venous Doppler; negative for DVT/SVT  Palliative care meeting -After a long discussion with patient's wife, brothers, and sister-in-law all agreed that patient would not like to continue along his current path. -Patient made comfort care, and will be discharged to-Hospice care in the A.m.  Discharge Diagnoses:   Principal Problem:   FUO (fever of unknown origin) Active Problems:   HYPERCHOLESTEROLEMIA   Iron deficiency anemia   Peripheral neuropathy (HCC)   BLINDNESS   HTN (hypertension)   CAD of vein bypass graft   Chronic systolic  heart failure (HCC)   Chronic kidney disease, stage III (moderate)   Pacemaker- [employer]- Jan 2015   Chronic pain syndrome   Diabetes mellitus type II, uncontrolled (HCC)   AKI (acute kidney injury) ([place])   Thrombocytopenia ([place])   Metabolic encephalopathy   Pulmonary HTN ([place])   Palliative care encounter   Aspiration pneumonia ([place])   Pressure ulcer   Ileus ([place])   Staphylococcus aureus bacteremia with sepsis ([place])   Pacemaker infection ([place])   Cardiomyopathy, ischemic-30-35%   Hospice care patient- currently limited code   Adynamic ileus ([place])   Abnormal x-ray of abdomen   Hypokalemia   CHB (complete heart block) ([place])   Acute respiratory distress ([place])   Controlled type 2 diabetes mellitus with complication, without long-term current use of insulin (HCC)   Paroxysmal atrial fibrillation ([place])   Hypoxia   Multisystem organ failure   Discharge Condition: Expected  Diet recommendation: Nothing by mouth  Filed Weights   05/30/15 0425 05/31/15 0441 06/01/15 0329  Weight: 83.6 kg (184 lb 4.9 oz) 81.5 kg (179 lb 10.8 oz) 81 kg (178 lb 9.2 oz)    History of present illness:  69 year old WM PMHx DM Type 2 controlled with SSI and diet, Chronic combined Systolic and Diastolic CHF, severe Pulmonary HTN, CAD S/P CABG, HTN, Complete Heart Block requiring pacemaker placement, HLD, procedure, hypertension, legally blind from diabetic retinopathy, severe peripheral neuropathy, compression fracture low back with chronic pain, childhood stroke 1945, chronic stasis dermatitis lower extremities, CKD stage III, Duodenal Ulcer.  Patient was sent to the ER by his wife via EMS after developing fevers that began yesterday afternoon. Initial fever was 102.3. No  apparent associated symptoms such as coughing, nausea, vomiting or diarrhea. Wife reports that as a treat she bought the patient a fast food sandwich shortly after eating a sandwich patient developed fever. Despite utilizing Tylenol patient's fever persisted during the night. It was also noted that with the onset of fever patient had altered mentation and really has not verbally communicated with the wife since onset of fever. He does not appear to have any focal neurological deficits other than not talking. He did receive influenza vaccine.  In the ER the patient had a rectal temperature of 102.6 which is decreased and 98.6 after IV fluids and rectal Tylenol. He is normotensive with a blood pressure 138/62 and MAP of 72. Pulse is regular in the 80s and ventricular paced, respiratory rate between 19 and 24. Patient was empirically placed on 2 L nasal cannula oxygen saturating 100%. Portable chest x-ray was read as mild CHF but has more of an appearance of pneumonitis. Patient was mildly hypokalemic with potassium of 3.3, he also had mild acute renal failure with creatinine of 1.27 and a GFR of 55 with baseline creatinine 1.18. Lactic acid was mildly elevated at 2.1 with repeat lactic acid several hours later 2.88. In addition to the Tylenol patient has received a total of 1 L of IV fluids in the ER because of elevated lactic acid and fever of unknown origin and he was given empiric Zosyn and vancomycin and blood cultures and urine cultures were obtained. Urinalysis did not appear consistent with a UTI. EKG revealed sinus rhythm with first AV block with prolonged QTC likely from underlying left bundle branch block  Shortly after I evaluated the patient and and prior to my attending evaluating the patient, the patient apparently had an witnessed episode of emesis and when attended to by nursing staff was  noted to have increased respiratory rate and decreased O2 saturation of 88% where he had previously been  maintaining O2 saturations between 98 and 100% on 2 L nasal cannula. Secondary to patient's multisystem organ failure, family believes patient's wishes would be typically made comfort care. Patient will be transported to inpatient hospice in a.m.     Consultants: Dr. Scarlette Shorts ([facility]) Dr. Sanda Klein (cardiology) Dr. Dietrich [place] (ID)    Procedure/Significant Events: 10/27/2013 echocardiogram;- LVEF= Inferior wall severely hypokinetic; moderately dilated.  -LVEF= 40%.- Left atrium: moderately dilated. - Pulmonary arteries: PA peak pressure: 57m Hg (S). 10/21 echocardiogram; LVEF= 30% - 35%. Diffuse hypokinesis.-(grade 1 diastolic dysfunction).      Culture 10/20 blood left/right AC positive MSSA staph 10/20 MRSA by PCR negative 10/20 influenza A/B/H1N1 negative 10/20 Respiratory virus panel negative 10/22 bilateral venous duplex ultrasounds;no obvious evidence of DVT or SVT noted in the visualized veins of the bilateral lower extremities.    Antibiotics: Zosyn 10/20>> stopped 10/22 Vancomycin 10/20>> stopped 10/22 Ceftriaxone 10/22>>   Discharge Exam: Filed Vitals:   06/01/15 1415 06/01/15 1513 06/01/15 1700 06/01/15 1954  BP: 146/62  133/51 111/48  Pulse: 89  70 72  Temp:  98.1 F (36.7 C)  98.7 F (37.1 C)  TempSrc:  Oral  Axillary  Resp: 32  31 28  Height:      Weight:      SpO2: 97%  98% 97%    General: Opens eyes spontaneously answers simple questions with yes/no. Appears to be uncomfortable, moaning, does not follow commands Eyes: negative scleral hemorrhage ENT: Negative Runny nose, gingival bleeding, Neck: Negative scars, masses, torticollis, lymphadenopathy, JVD Lungs: Clear to auscultation bilaterally without wheezes or crackles Cardiovascular: Regular rate and rhythm without murmur gallop or rub normal S1 and S2  Discharge Instructions     Medication List    ASK your doctor about these medications        AMBULATORY NON FORMULARY  MEDICATION  Pt on O2 - 2 liter     ANTI-FUNGAL 1 % powder  Generic drug:  tolnaftate  Apply 1 application topically 3 (three) times a week. As needed for infection     aspirin EC 81 MG tablet  Take 81 mg by mouth at bedtime.     atorvastatin 40 MG tablet  Commonly known as:  LIPITOR  TAKE 1 TABLET BY MOUTH EVERY DAY     BAYER BREEZE 2 SYSTEM W/DEVICE Kit  1 Device by Does not apply route once.     calcitRIOL 0.25 MCG capsule  Commonly known as:  ROCALTROL  TAKE 1 CAPSULE BY MOUTH EVERY DAY     carvedilol 3.125 MG tablet  Commonly known as:  COREG  TAKE 1 TABLET BY MOUTH TWICE DAILY WITH A MEAL     clopidogrel 75 MG tablet  Commonly known as:  PLAVIX  TAKE 1 TABLET BY MOUTH EVERY DAY     co-enzyme Q-10 30 MG capsule  Take 30 mg by mouth daily. @@ 2pm     dicyclomine 20 MG tablet  Commonly known as:  BENTYL  Take 1 tablet (20 mg total) by mouth 2 (two) times daily.     docusate sodium 100 MG capsule  Commonly known as:  COLACE  Take 300 mg by mouth daily.     famotidine 20 MG tablet  Commonly known as:  PEPCID  Take 20 mg by mouth before cath procedure. stomach     famotidine 20 MG tablet  Commonly  known as:  PEPCID  Take 20 mg by mouth daily as needed. indigestion     furosemide 40 MG tablet  Commonly known as:  LASIX  TAKE 2 TABLETS BY MOUTH TWICE DAILY     gabapentin 400 MG capsule  Commonly known as:  NEURONTIN  Take 2 capsules (800 mg total) by mouth 3 (three) times daily.     HYDROcodone-acetaminophen 10-325 MG tablet  Commonly known as:  NORCO  Take 1-2 tablets by mouth every 4 (four) hours as needed for moderate pain.     hyoscyamine 0.125 MG SL tablet  Commonly known as:  LEVSIN SL  DISSOLVE 1 TABLET UNDER THE TONGUE EVERY 4 HOURS AS NEEDED     hyoscyamine 0.125 MG SL tablet  Commonly known as:  LEVSIN SL  DISSOLVE 1 TABLET UNDER THE TONGUE EVERY 4 HOURS AS NEEDED     isosorbide mononitrate 30 MG 24 hr tablet  Commonly known as:  IMDUR   TAKE 1 TABLET BY MOUTH EVERY DAY     LORazepam 0.5 MG tablet  Commonly known as:  ATIVAN  Take 1 tablet (0.5 mg total) by mouth every 6 (six) hours as needed for anxiety (anxiety).     losartan 50 MG tablet  Commonly known as:  COZAAR  TAKE 1/2 TABLET BY MOUTH EVERY DAY     methadone 5 MG tablet  Commonly known as:  DOLOPHINE  Take 1 tablet (5 mg total) by mouth 2 (two) times daily.     morphine CONCENTRATE 10 mg / 0.5 ml concentrated solution  Place 0.25-0.5 mLs (5-10 mg total) under the tongue every 2 (two) hours as needed for moderate pain, severe pain, anxiety or shortness of breath.     nitroGLYCERIN 0.4 MG SL tablet  Commonly known as:  NITROSTAT  Place 1 tablet (0.4 mg total) under the tongue every 5 (five) minutes as needed for chest pain.     OVER THE COUNTER MEDICATION  Place 1 drop into both eyes daily as needed (dry eyes). [facility] eye drops     pantoprazole 40 MG tablet  Commonly known as:  PROTONIX  Take 1 tablet (40 mg total) by mouth 2 (two) times daily.     polyethylene glycol powder powder  Commonly known as:  GLYCOLAX/MIRALAX  Take 1 Container by mouth daily.     polyethylene glycol powder powder  Commonly known as:  GLYCOLAX/MIRALAX  DISSOLVE 9 GRAMS( 1/2 CAPFUL) IN AT LEAST 8 OUNCES OF WATER/ JUICE AND DRINK THREE TIMES WEEKLY     potassium chloride SA 20 MEQ tablet  Commonly known as:  K-DUR,KLOR-CON  TAKE 1 TABLET BY MOUTH TWICE DAILY     SENNA PLUS PO  Take 1-4 capsules by mouth every morning.     tamsulosin 0.4 MG Caps capsule  Commonly known as:  FLOMAX  TAKE 1 CAPSULE BY MOUTH DAILY       Allergies  Allergen Reactions  . Sildenafil Nausea Only and Other (See Comments)    "took it once; heart went to pieces; never took it again"  . Percocet [Oxycodone-Acetaminophen] Other (See Comments)    Unknown reaction-possible altered mental state-per wife.    . Contrast Media [Iodinated Diagnostic Agents] Nausea Only      The results of  significant diagnostics from this hospitalization (including imaging, microbiology, ancillary and laboratory) are listed below for reference.    Significant Diagnostic Studies: Ct Head Wo Contrast  05/28/2015  CLINICAL DATA:  Left lower extremity paralysis, new onset  somnolence EXAM: CT HEAD WITHOUT CONTRAST TECHNIQUE: Contiguous axial images were obtained from the base of the skull through the vertex without intravenous contrast. COMPARISON:  07/06/2014 FINDINGS: No evidence of parenchymal hemorrhage or extra-axial fluid collection. No mass lesion, mass effect, or midline shift. No CT evidence of acute infarction. Old left frontal infarct (series 2/ image 21). Subcortical white matter and periventricular small vessel ischemic changes. Intracranial atherosclerosis. Global cortical atrophy.  No ventriculomegaly. The visualized paranasal sinuses are essentially clear. The mastoid air cells are unopacified. No evidence of calvarial fracture. IMPRESSION: No evidence of acute intracranial abnormality. Old left frontal infarct. Atrophy with small vessel ischemic changes. Electronically Signed   By: [name]   On: 05/28/2015 17:29   Dg Chest Port 1 View  05/29/2015  CLINICAL DATA:  Acute respiratory distress EXAM: PORTABLE CHEST 1 VIEW COMPARISON:  05/29/2015 FINDINGS: NG tube stable. Pacemaker and lead from the left subclavian vein stable. Lungs less aerated. Pleural effusions increased. Pulmonary edema is stable on the left and improved on the right. No pneumothorax. IMPRESSION: Bilateral pulmonary edema stable on the left and improved on the right. Worsening low volumes and bilateral pleural effusions. Electronically Signed   By: Marybelle Killings M.D.   On: 05/29/2015 08:54   Dg Chest Port 1 View  05/29/2015  CLINICAL DATA:  Acute onset of hypoxia and tachypnea. Initial encounter. EXAM: PORTABLE CHEST 1 VIEW COMPARISON:  Chest radiograph from 05/28/2015 FINDINGS: The patient's enteric tube is  noted extending below the diaphragm. New bilateral airspace opacification is noted, possibly reflecting pulmonary edema or pneumonia. No pleural effusion or pneumothorax is seen. The cardiomediastinal silhouette is borderline normal in size. The patient is status post median sternotomy, with evidence of prior CABG. A pacemaker is noted overlying the left chest wall, with leads ending overlying the right atrium and right ventricle. No acute osseous abnormalities are seen. Cervical spinal fusion hardware is partially imaged. IMPRESSION: New bilateral airspace opacification, possibly reflecting pulmonary edema or pneumonia. Electronically Signed   By: Garald Balding M.D.   On: 05/29/2015 00:55   Dg Chest Port 1 View  05/28/2015  CLINICAL DATA:  Aspiration pneumonia. EXAM: PORTABLE CHEST 1 VIEW COMPARISON:  05/27/2015 FINDINGS: Sternotomy wires and left-sided pacemaker unchanged. Patient is rotated to the right. Lungs are adequately inflated demonstrate continued mild prominence of the perihilar markings left-greater-than-right likely mild vascular congestion although cannot exclude developing infection in the left mid to lower lung. No evidence of effusion. Mild stable cardiomegaly. Remainder of the exam is unchanged. IMPRESSION: Stable prominence of the perihilar markings left-greater-than-right suggesting mild vascular congestion, although cannot exclude developing infection in the left mid to lower lung. Mild stable cardiomegaly. Electronically Signed   By: Marin Olp M.D.   On: 05/28/2015 12:18   Dg Chest Portable 1 View  05/27/2015  CLINICAL DATA:  Code sepsis EXAM: PORTABLE CHEST 1 VIEW COMPARISON:  06/09/2014 FINDINGS: Chronic cardiomegaly with pulmonary venous congestion and interstitial coarsening above baseline. No pneumonia, effusion, or air leak. Status post CABG. There is a biventricular pacer from the left without evidence of interval lead displacement. IMPRESSION: Mild CHF. Electronically  Signed   By: Monte Fantasia M.D.   On: 05/27/2015 11:14   Dg Abd 2 Views  05/30/2015  CLINICAL DATA:  Ileus EXAM: ABDOMEN - 2 VIEW COMPARISON:  05/28/2015 FINDINGS: Enteric tube terminates in the distal second portion of the duodenum. Nonobstructive bowel gas pattern. No evidence of free air on the lateral decubitus view. Degenerative changes  of the lumbar spine. IMPRESSION: Enteric tube terminates in the distal second portion the duodenum. No evidence of small bowel obstruction or free air. Electronically Signed   By: [name]   On: 05/30/2015 10:02   Dg Abd 2 Views  05/28/2015  CLINICAL DATA:  Fevers and vomiting EXAM: ABDOMEN - 2 VIEW COMPARISON:  None. FINDINGS: Scattered large and small bowel gas is noted. No free air is seen. No obstructive changes are noted. The overall appearance suggests an underlying ileus. Clinical correlation with the physical exam is recommended. No bony abnormality is seen. No abnormal mass or abnormal calcifications are noted. IMPRESSION: Changes suggestive of a generalized ileus. Correlation with the physical exam is recommended. Electronically Signed   By: Inez Catalina M.D.   On: 05/28/2015 17:41    Microbiology: Recent Results (from the past 240 hour(s))  Blood Culture (routine x 2)     Status: None   Collection Time: 05/27/15  9:20 AM  Result Value Ref Range Status   Specimen Description BLOOD LEFT ANTECUBITAL  Final   Special Requests BOTTLES DRAWN AEROBIC AND ANAEROBIC 5ML  Final   Culture  Setup Time   Final    GRAM POSITIVE COCCI IN CLUSTERS IN BOTH AEROBIC AND ANAEROBIC BOTTLES CRITICAL RESULT CALLED TO, READ BACK BY AND VERIFIED WITH: R TARWATER RN 2250 05/27/15 A BROWNING    Culture STAPHYLOCOCCUS AUREUS  Final   Report Status 05/29/2015 FINAL  Final   Organism ID, Bacteria STAPHYLOCOCCUS AUREUS  Final      Susceptibility   Staphylococcus aureus - MIC*    CIPROFLOXACIN <=0.5 SENSITIVE Sensitive     ERYTHROMYCIN 0.5 SENSITIVE  Sensitive     GENTAMICIN <=0.5 SENSITIVE Sensitive     OXACILLIN <=0.25 SENSITIVE Sensitive     TETRACYCLINE <=1 SENSITIVE Sensitive     VANCOMYCIN 1 SENSITIVE Sensitive     TRIMETH/SULFA <=10 SENSITIVE Sensitive     CLINDAMYCIN <=0.25 SENSITIVE Sensitive     RIFAMPIN <=0.5 SENSITIVE Sensitive     Inducible Clindamycin NEGATIVE Sensitive     * STAPHYLOCOCCUS AUREUS  Blood Culture (routine x 2)     Status: None   Collection Time: 05/27/15  9:39 AM  Result Value Ref Range Status   Specimen Description BLOOD RIGHT ANTECUBITAL  Final   Special Requests BOTTLES DRAWN AEROBIC AND ANAEROBIC 5ML  Final   Culture  Setup Time   Final    GRAM POSITIVE COCCI IN CLUSTERS IN BOTH AEROBIC AND ANAEROBIC BOTTLES CRITICAL RESULT CALLED TO, READ BACK BY AND VERIFIED WITH: R TARWATER RN 2250 05/27/15 A BROWNING    Culture   Final    STAPHYLOCOCCUS AUREUS SUSCEPTIBILITIES PERFORMED ON PREVIOUS CULTURE WITHIN THE LAST 5 DAYS.    Report Status 05/29/2015 FINAL  Final  Urine culture     Status: None   Collection Time: 05/27/15  9:52 AM  Result Value Ref Range Status   Specimen Description URINE, CLEAN CATCH  Final   Special Requests NONE  Final   Culture NO GROWTH 1 DAY  Final   Report Status 05/28/2015 FINAL  Final  Respiratory virus panel     Status: None   Collection Time: 05/27/15 12:43 PM  Result Value Ref Range Status   Source - RVPAN NASAL SWAB  Corrected   Respiratory Syncytial Virus A Negative Negative Final   Respiratory Syncytial Virus B Negative Negative Final   Influenza A Negative Negative Final   Influenza B Negative Negative Final   Parainfluenza 1  Negative Negative Final   Parainfluenza 2 Negative Negative Final   Parainfluenza 3 Negative Negative Final   Metapneumovirus Negative Negative Final   Rhinovirus Negative Negative Final   Adenovirus Negative Negative Final    Comment: (NOTE) Performed At: [facility] [address] [place], [place] [id number] Lindon Romp MD CW:[id number]   MRSA PCR Screening     Status: None   Collection Time: 05/27/15  4:25 PM  Result Value Ref Range Status   MRSA by PCR NEGATIVE NEGATIVE Final    Comment:        The GeneXpert MRSA Assay (FDA approved for NASAL specimens only), is one component of a comprehensive MRSA colonization surveillance program. It is not intended to diagnose MRSA infection nor to guide or monitor treatment for MRSA infections.   Culture, blood (routine x 2)     Status: None (Preliminary result)   Collection Time: 05/29/15 11:45 AM  Result Value Ref Range Status   Specimen Description BLOOD RIGHT ANTECUBITAL  Final   Special Requests BOTTLES DRAWN AEROBIC AND ANAEROBIC 10CC  Final   Culture NO GROWTH 3 DAYS  Final   Report Status PENDING  Incomplete  Culture, blood (routine x 2)     Status: None (Preliminary result)   Collection Time: 05/29/15 12:00 PM  Result Value Ref Range Status   Specimen Description BLOOD LEFT ANTECUBITAL  Final   Special Requests BOTTLES DRAWN AEROBIC AND ANAEROBIC 10CC  Final   Culture NO GROWTH 3 DAYS  Final   Report Status PENDING  Incomplete     Labs: Basic Metabolic Panel:  Recent Labs Lab 05/27/15 1650 05/28/15 0410 05/29/15 0653 05/29/15 1145 05/30/15 0420 05/31/15 0358 06/01/15 0325  NA  --  143 149*  --  149* 157* 159*  K  --  4.0 2.8* 4.0 3.4* 3.7 3.6  CL  --  105 103  --  107 116* 115*  CO2  --  24 29  --  30 32 34*  GLUCOSE  --  153* 200*  --  163* 180* 212*  BUN  --  23* 20  --  32* 35* 31*  CREATININE  --  1.30* 1.48*  --  1.20 1.28* 1.10  CALCIUM  --  8.4* 8.6*  --  8.6* 9.0 8.8*  MG 2.1  --  2.3 2.5* 2.6* 2.9*  --   PHOS 3.4  --   --   --   --   --   --    Liver Function Tests:  Recent Labs Lab 05/28/15 0410 05/29/15 0653 05/30/15 0420 05/31/15 0358 06/01/15 0325  AST 62* 70* 54* 36 32  ALT 49 59 45 35 29  ALKPHOS 55 52 45 42 39  BILITOT 0.7 1.0 1.2 0.9 1.0  PROT 6.1* 6.5 6.6 6.1* 6.1*  ALBUMIN 2.9* 2.6*  2.6* 2.6* 2.5*    Recent Labs Lab 05/29/15 0855  LIPASE 37  AMYLASE 53   No results for input(s): AMMONIA in the last 168 hours. CBC:  Recent Labs Lab 05/27/15 0920 05/28/15 0410 05/29/15 0047 05/30/15 0420 05/31/15 0358 06/01/15 0325  WBC 7.5 8.3 7.1 7.4 7.6 7.7  NEUTROABS 6.7  --  5.8 5.5 5.4  --   HGB 15.2 15.6 17.9* 17.6* 16.9 17.4*  HCT 45.8 47.6 53.3* 53.0* 53.2* 54.7*  MCV 92.0 93.0 92.1 93.3 95.2 95.6  PLT 126* 90* 80* 92* 113* 108*   Cardiac Enzymes:  Recent Labs Lab 05/28/15 1953 05/29/15 [place]  0.29* 1.13*   BNP: BNP (last 3 results) No results for input(s): BNP in the last 8760 hours.  ProBNP (last 3 results)  Recent Labs  08/25/14 1041  PROBNP 179.0*    CBG:  Recent Labs Lab 06/01/15 0319 06/01/15 0759 06/01/15 1150 06/01/15 1518 06/01/15 1958  GLUCAP 175* 167* 140* 151* 158*       Signed:  Dia Crawford, MD [facility] [phone] pager

## 2015-06-02 LAB — GLUCOSE, CAPILLARY
GLUCOSE-CAPILLARY: 156 mg/dL — AB (ref 65–99)
Glucose-Capillary: 185 mg/dL — ABNORMAL HIGH (ref 65–99)

## 2015-06-02 NOTE — Discharge Summary (Signed)
Physician Discharge Summary  DOIL KAMARA ZOX:[id number] DOB: 1946-02-26 DOA: 05/27/2015  PCP: [name], MD  Admit date: 05/27/2015 Discharge date: 06/02/2015  Time spent: 90 minutes  Recommendations for Outpatient Follow-up:  Multisystem organ failure -See below  Positive MSSA Staph bacteremia/CAP -Blood cultures 2/2 positive MSSA staph aureus  -CXR; LLL pneumonia? -Continue ceftriaxone per ID (see below) until discharge -Continue emesis so continue IV Reglan and low-dose IV Phenergan (no Zofran in setting of prolonged QT interval) -Cardiology consult; removal of Pacemaker impractical -Hospice care  Acute respiratory failure with hypoxemia/likely aspiration pneumonia -Continue supportive care with oxygen-due to recent emesis and altered mentation not an appropriate candidate for BiPAP at this juncture -Titrate O2 to maintain SPO2> 93%  Bilateral pleural effusion -10/22 PCXR shows worsening pulmonary edema and bilateral pleural effusion  Metabolic encephalopathy -Suspect related to fever and underlying infectious processes -NPO until more alert  AKI ([place]) on Chronic kidney disease, stage III (moderate) -Cr responded to hydration.  Generalized ileus/Ogilvie syndrome? -Most likely multifactorial to include chronic high-dose narcotic use, MSSA Staph bacteremia  -Continue NG tube; low continuous suction -NOTE; patient seen in the past by Dr. Delfin Edis ([facility]) for chronic abdominal pain. Last seen 05/19/2014. Per her note patient has never had colonoscopy. -GI consulted; recommend continuing NG tube and rectal tube -Remove all tubes prior to discharge-Hospice care  Thrombocytopenia ([place]) -New thrombocytopenia with platelets< 100,000, trending up -Suspect related to underlying infectious process and acute inflammation  Hypokalemia -Potassium goal> 4 -Hospice care  HTN (hypertension) -Relatively hypotensive hold BP medication  -Hydralazine IV 5 mg PRN SBP>  160 or DBP> 100  CAD of autologous vein bypass graft -Patient somnolent/obtunded; troponin trending up cardiology consulted -Preadmission statin, Plavix, Imdur as well as carvedilol on hold while NPO  Chronic systolic and diastolic heart failure [facility]) -Patient's cardiologist is Dr.Michael Burt Knack. Last seen 12/29/2014 -Ejection fraction 40% last echocardiogram March 2015 revealed severe hypokinesis of the inferior wall with left ventricular dilatation -Echocardiogram 10/21; slightly worsening EF see results below -Strict in and out since admission - 1.3 L  -Daily a.m. Weight; 10/25 bed weight= 81 kg -Hospice care  Pacemaker -Currently actively pacing -MSSA staph bacteremia pacer will need to be removed if possible; removal of pacer and wire impractical per cardiology  Pulmonary HTN (HCC)/COPD -Severe with most recent reading of 83 mmHg in 2015 -Imdur on hold  Chronic pain syndrome -due to ongoing peripheral neuropathy and apparent lumbar compression fracture -Hospice care continue pain meds for comfort  Diabetes mellitus type II, controlled ([place]) -Current CBGs greater than 200 and likely related to acute infectious processes -10/20 Hemoglobin A1c= 6.6 -Sensitive SSI   HYPERCHOLESTEROLEMIA -Statin on hold  Iron deficiency anemia -Current hemoglobin 15.2 with baseline hemoglobin around 13 consistent with hemoconcentration and volume depletion  Peripheral neuropathy (HCC) -Preadmission narcotics and gabapentin on hold  BLINDNESS -Secondary to diabetic retinopathy  DVT  -Patient with palpable cords left calf, bilateral poor circulation -lower extremity venous Doppler; negative for DVT/SVT  Palliative care meeting -After a long discussion with patient's wife, brothers, and sister-in-law all agreed that patient would not like to continue along his current path. -Patient made comfort care, and will be discharged to-Hospice care in the A.m.  Discharge Diagnoses:   Principal Problem:   FUO (fever of unknown origin) Active Problems:   HYPERCHOLESTEROLEMIA   Iron deficiency anemia   Peripheral neuropathy (HCC)   BLINDNESS   HTN (hypertension)   CAD of vein bypass graft   Chronic systolic  heart failure (HCC)   Chronic kidney disease, stage III (moderate)   Pacemaker- [employer]- Jan 2015   Chronic pain syndrome   Diabetes mellitus type II, uncontrolled (HCC)   AKI (acute kidney injury) ([place])   Thrombocytopenia ([place])   Metabolic encephalopathy   Pulmonary HTN ([place])   Palliative care encounter   Aspiration pneumonia ([place])   Pressure ulcer   Ileus ([place])   Staphylococcus aureus bacteremia with sepsis ([place])   Pacemaker infection ([place])   Cardiomyopathy, ischemic-30-35%   Hospice care patient- currently limited code   Adynamic ileus ([place])   Abnormal x-ray of abdomen   Hypokalemia   CHB (complete heart block) ([place])   Acute respiratory distress ([place])   Controlled type 2 diabetes mellitus with complication, without long-term current use of insulin (HCC)   Paroxysmal atrial fibrillation ([place])   Hypoxia   Multisystem organ failure   Discharge Condition: Expected  Diet recommendation: Nothing by mouth  Filed Weights   05/31/15 0441 06/01/15 0329 06/02/15 0454  Weight: 81.5 kg (179 lb 10.8 oz) 81 kg (178 lb 9.2 oz) 79.7 kg (175 lb 11.3 oz)    History of present illness:  69 year old WM PMHx DM Type 2 controlled with SSI and diet, Chronic combined Systolic and Diastolic CHF, severe Pulmonary HTN, CAD S/P CABG, HTN, Complete Heart Block requiring pacemaker placement, HLD, procedure, hypertension, legally blind from diabetic retinopathy, severe peripheral neuropathy, compression fracture low back with chronic pain, childhood stroke 1945, chronic stasis dermatitis lower extremities, CKD stage III, Duodenal Ulcer.  Patient was sent to the ER by his wife via EMS after developing fevers that began yesterday afternoon. Initial fever was 102.3. No  apparent associated symptoms such as coughing, nausea, vomiting or diarrhea. Wife reports that as a treat she bought the patient a fast food sandwich shortly after eating a sandwich patient developed fever. Despite utilizing Tylenol patient's fever persisted during the night. It was also noted that with the onset of fever patient had altered mentation and really has not verbally communicated with the wife since onset of fever. He does not appear to have any focal neurological deficits other than not talking. He did receive influenza vaccine.  In the ER the patient had a rectal temperature of 102.6 which is decreased and 98.6 after IV fluids and rectal Tylenol. He is normotensive with a blood pressure 138/62 and MAP of 72. Pulse is regular in the 80s and ventricular paced, respiratory rate between 19 and 24. Patient was empirically placed on 2 L nasal cannula oxygen saturating 100%. Portable chest x-ray was read as mild CHF but has more of an appearance of pneumonitis. Patient was mildly hypokalemic with potassium of 3.3, he also had mild acute renal failure with creatinine of 1.27 and a GFR of 55 with baseline creatinine 1.18. Lactic acid was mildly elevated at 2.1 with repeat lactic acid several hours later 2.88. In addition to the Tylenol patient has received a total of 1 L of IV fluids in the ER because of elevated lactic acid and fever of unknown origin and he was given empiric Zosyn and vancomycin and blood cultures and urine cultures were obtained. Urinalysis did not appear consistent with a UTI. EKG revealed sinus rhythm with first AV block with prolonged QTC likely from underlying left bundle branch block  Shortly after I evaluated the patient and and prior to my attending evaluating the patient, the patient apparently had an witnessed episode of emesis and when attended to by nursing staff was  noted to have increased respiratory rate and decreased O2 saturation of 88% where he had previously been  maintaining O2 saturations between 98 and 100% on 2 L nasal cannula. Secondary to patient's multisystem organ failure, family believes patient's wishes would be typically made comfort care. Patient will be transported to inpatient hospice in a.m.     Consultants: Dr. Scarlette Shorts ([facility]) Dr. Sanda Klein (cardiology) Dr. Dietrich [place] (ID)    Procedure/Significant Events: 10/27/2013 echocardiogram;- LVEF= Inferior wall severely hypokinetic; moderately dilated.  -LVEF= 40%.- Left atrium: moderately dilated. - Pulmonary arteries: PA peak pressure: 32m Hg (S). 10/21 echocardiogram; LVEF= 30% - 35%. Diffuse hypokinesis.-(grade 1 diastolic dysfunction).      Culture 10/20 blood left/right AC positive MSSA staph 10/20 MRSA by PCR negative 10/20 influenza A/B/H1N1 negative 10/20 Respiratory virus panel negative 10/22 bilateral venous duplex ultrasounds;no obvious evidence of DVT or SVT noted in the visualized veins of the bilateral lower extremities.    Antibiotics: Zosyn 10/20>> stopped 10/22 Vancomycin 10/20>> stopped 10/22 Ceftriaxone 10/22>>   Discharge Exam: Filed Vitals:   06/01/15 2308 06/02/15 0445 06/02/15 0454 06/02/15 0746  BP: 119/51 151/60    Pulse: 88 97    Temp: 98.6 F (37 C) 98.9 F (37.2 C)  98.5 F (36.9 C)  TempSrc: Axillary Oral  Axillary  Resp: 28 35    Height:      Weight:   79.7 kg (175 lb 11.3 oz)   SpO2: 93% 93%      General: Opens eyes spontaneously answers simple questions with yes/no. Appears to be uncomfortable, moaning, does not follow commands Eyes: negative scleral hemorrhage ENT: Negative Runny nose, gingival bleeding, Neck: Negative scars, masses, torticollis, lymphadenopathy, JVD Lungs: Clear to auscultation bilaterally without wheezes or crackles Cardiovascular: Regular rate and rhythm without murmur gallop or rub normal S1 and S2  Discharge Instructions     Medication List    ASK your doctor about these medications         AMBULATORY NON FORMULARY MEDICATION  Pt on O2 - 2 liter     ANTI-FUNGAL 1 % powder  Generic drug:  tolnaftate  Apply 1 application topically 3 (three) times a week. As needed for infection     aspirin EC 81 MG tablet  Take 81 mg by mouth at bedtime.     atorvastatin 40 MG tablet  Commonly known as:  LIPITOR  TAKE 1 TABLET BY MOUTH EVERY DAY     BAYER BREEZE 2 SYSTEM W/DEVICE Kit  1 Device by Does not apply route once.     calcitRIOL 0.25 MCG capsule  Commonly known as:  ROCALTROL  TAKE 1 CAPSULE BY MOUTH EVERY DAY     carvedilol 3.125 MG tablet  Commonly known as:  COREG  TAKE 1 TABLET BY MOUTH TWICE DAILY WITH A MEAL     clopidogrel 75 MG tablet  Commonly known as:  PLAVIX  TAKE 1 TABLET BY MOUTH EVERY DAY     co-enzyme Q-10 30 MG capsule  Take 30 mg by mouth daily. @@ 2pm     dicyclomine 20 MG tablet  Commonly known as:  BENTYL  Take 1 tablet (20 mg total) by mouth 2 (two) times daily.     docusate sodium 100 MG capsule  Commonly known as:  COLACE  Take 300 mg by mouth daily.     famotidine 20 MG tablet  Commonly known as:  PEPCID  Take 20 mg by mouth before cath procedure. stomach  famotidine 20 MG tablet  Commonly known as:  PEPCID  Take 20 mg by mouth daily as needed. indigestion     furosemide 40 MG tablet  Commonly known as:  LASIX  TAKE 2 TABLETS BY MOUTH TWICE DAILY     gabapentin 400 MG capsule  Commonly known as:  NEURONTIN  Take 2 capsules (800 mg total) by mouth 3 (three) times daily.     HYDROcodone-acetaminophen 10-325 MG tablet  Commonly known as:  NORCO  Take 1-2 tablets by mouth every 4 (four) hours as needed for moderate pain.     hyoscyamine 0.125 MG SL tablet  Commonly known as:  LEVSIN SL  DISSOLVE 1 TABLET UNDER THE TONGUE EVERY 4 HOURS AS NEEDED     hyoscyamine 0.125 MG SL tablet  Commonly known as:  LEVSIN SL  DISSOLVE 1 TABLET UNDER THE TONGUE EVERY 4 HOURS AS NEEDED     isosorbide mononitrate 30 MG 24 hr tablet   Commonly known as:  IMDUR  TAKE 1 TABLET BY MOUTH EVERY DAY     LORazepam 0.5 MG tablet  Commonly known as:  ATIVAN  Take 1 tablet (0.5 mg total) by mouth every 6 (six) hours as needed for anxiety (anxiety).     losartan 50 MG tablet  Commonly known as:  COZAAR  TAKE 1/2 TABLET BY MOUTH EVERY DAY     methadone 5 MG tablet  Commonly known as:  DOLOPHINE  Take 1 tablet (5 mg total) by mouth 2 (two) times daily.     morphine CONCENTRATE 10 mg / 0.5 ml concentrated solution  Place 0.25-0.5 mLs (5-10 mg total) under the tongue every 2 (two) hours as needed for moderate pain, severe pain, anxiety or shortness of breath.     nitroGLYCERIN 0.4 MG SL tablet  Commonly known as:  NITROSTAT  Place 1 tablet (0.4 mg total) under the tongue every 5 (five) minutes as needed for chest pain.     OVER THE COUNTER MEDICATION  Place 1 drop into both eyes daily as needed (dry eyes). [employer] eye drops     pantoprazole 40 MG tablet  Commonly known as:  PROTONIX  Take 1 tablet (40 mg total) by mouth 2 (two) times daily.     polyethylene glycol powder powder  Commonly known as:  GLYCOLAX/MIRALAX  Take 1 Container by mouth daily.     polyethylene glycol powder powder  Commonly known as:  GLYCOLAX/MIRALAX  DISSOLVE 9 GRAMS( 1/2 CAPFUL) IN AT LEAST 8 OUNCES OF WATER/ JUICE AND DRINK THREE TIMES WEEKLY     potassium chloride SA 20 MEQ tablet  Commonly known as:  K-DUR,KLOR-CON  TAKE 1 TABLET BY MOUTH TWICE DAILY     SENNA PLUS PO  Take 1-4 capsules by mouth every morning.     tamsulosin 0.4 MG Caps capsule  Commonly known as:  FLOMAX  TAKE 1 CAPSULE BY MOUTH DAILY       Allergies  Allergen Reactions  . Sildenafil Nausea Only and Other (See Comments)    "took it once; heart went to pieces; never took it again"  . Percocet [Oxycodone-Acetaminophen] Other (See Comments)    Unknown reaction-possible altered mental state-per wife.    . Contrast Media [Iodinated Diagnostic Agents] Nausea Only       The results of significant diagnostics from this hospitalization (including imaging, microbiology, ancillary and laboratory) are listed below for reference.    Significant Diagnostic Studies: Ct Head Wo Contrast  05/28/2015  CLINICAL DATA:  Left lower extremity paralysis, new onset somnolence EXAM: CT HEAD WITHOUT CONTRAST TECHNIQUE: Contiguous axial images were obtained from the base of the skull through the vertex without intravenous contrast. COMPARISON:  07/06/2014 FINDINGS: No evidence of parenchymal hemorrhage or extra-axial fluid collection. No mass lesion, mass effect, or midline shift. No CT evidence of acute infarction. Old left frontal infarct (series 2/ image 21). Subcortical white matter and periventricular small vessel ischemic changes. Intracranial atherosclerosis. Global cortical atrophy.  No ventriculomegaly. The visualized paranasal sinuses are essentially clear. The mastoid air cells are unopacified. No evidence of calvarial fracture. IMPRESSION: No evidence of acute intracranial abnormality. Old left frontal infarct. Atrophy with small vessel ischemic changes. Electronically Signed   By: [name]   On: 05/28/2015 17:29   Dg Chest Port 1 View  05/29/2015  CLINICAL DATA:  Acute respiratory distress EXAM: PORTABLE CHEST 1 VIEW COMPARISON:  05/29/2015 FINDINGS: NG tube stable. Pacemaker and lead from the left subclavian vein stable. Lungs less aerated. Pleural effusions increased. Pulmonary edema is stable on the left and improved on the right. No pneumothorax. IMPRESSION: Bilateral pulmonary edema stable on the left and improved on the right. Worsening low volumes and bilateral pleural effusions. Electronically Signed   By: Marybelle Killings M.D.   On: 05/29/2015 08:54   Dg Chest Port 1 View  05/29/2015  CLINICAL DATA:  Acute onset of hypoxia and tachypnea. Initial encounter. EXAM: PORTABLE CHEST 1 VIEW COMPARISON:  Chest radiograph from 05/28/2015 FINDINGS: The  patient's enteric tube is noted extending below the diaphragm. New bilateral airspace opacification is noted, possibly reflecting pulmonary edema or pneumonia. No pleural effusion or pneumothorax is seen. The cardiomediastinal silhouette is borderline normal in size. The patient is status post median sternotomy, with evidence of prior CABG. A pacemaker is noted overlying the left chest wall, with leads ending overlying the right atrium and right ventricle. No acute osseous abnormalities are seen. Cervical spinal fusion hardware is partially imaged. IMPRESSION: New bilateral airspace opacification, possibly reflecting pulmonary edema or pneumonia. Electronically Signed   By: Garald Balding M.D.   On: 05/29/2015 00:55   Dg Chest Port 1 View  05/28/2015  CLINICAL DATA:  Aspiration pneumonia. EXAM: PORTABLE CHEST 1 VIEW COMPARISON:  05/27/2015 FINDINGS: Sternotomy wires and left-sided pacemaker unchanged. Patient is rotated to the right. Lungs are adequately inflated demonstrate continued mild prominence of the perihilar markings left-greater-than-right likely mild vascular congestion although cannot exclude developing infection in the left mid to lower lung. No evidence of effusion. Mild stable cardiomegaly. Remainder of the exam is unchanged. IMPRESSION: Stable prominence of the perihilar markings left-greater-than-right suggesting mild vascular congestion, although cannot exclude developing infection in the left mid to lower lung. Mild stable cardiomegaly. Electronically Signed   By: Marin Olp M.D.   On: 05/28/2015 12:18   Dg Chest Portable 1 View  05/27/2015  CLINICAL DATA:  Code sepsis EXAM: PORTABLE CHEST 1 VIEW COMPARISON:  06/09/2014 FINDINGS: Chronic cardiomegaly with pulmonary venous congestion and interstitial coarsening above baseline. No pneumonia, effusion, or air leak. Status post CABG. There is a biventricular pacer from the left without evidence of interval lead displacement. IMPRESSION:  Mild CHF. Electronically Signed   By: Monte Fantasia M.D.   On: 05/27/2015 11:14   Dg Abd 2 Views  05/30/2015  CLINICAL DATA:  Ileus EXAM: ABDOMEN - 2 VIEW COMPARISON:  05/28/2015 FINDINGS: Enteric tube terminates in the distal second portion of the duodenum. Nonobstructive bowel gas pattern. No evidence of free air on  the lateral decubitus view. Degenerative changes of the lumbar spine. IMPRESSION: Enteric tube terminates in the distal second portion the duodenum. No evidence of small bowel obstruction or free air. Electronically Signed   By: [name]   On: 05/30/2015 10:02   Dg Abd 2 Views  05/28/2015  CLINICAL DATA:  Fevers and vomiting EXAM: ABDOMEN - 2 VIEW COMPARISON:  None. FINDINGS: Scattered large and small bowel gas is noted. No free air is seen. No obstructive changes are noted. The overall appearance suggests an underlying ileus. Clinical correlation with the physical exam is recommended. No bony abnormality is seen. No abnormal mass or abnormal calcifications are noted. IMPRESSION: Changes suggestive of a generalized ileus. Correlation with the physical exam is recommended. Electronically Signed   By: Inez Catalina M.D.   On: 05/28/2015 17:41    Microbiology: Recent Results (from the past 240 hour(s))  Blood Culture (routine x 2)     Status: None   Collection Time: 05/27/15  9:20 AM  Result Value Ref Range Status   Specimen Description BLOOD LEFT ANTECUBITAL  Final   Special Requests BOTTLES DRAWN AEROBIC AND ANAEROBIC 5ML  Final   Culture  Setup Time   Final    GRAM POSITIVE COCCI IN CLUSTERS IN BOTH AEROBIC AND ANAEROBIC BOTTLES CRITICAL RESULT CALLED TO, READ BACK BY AND VERIFIED WITH: R TARWATER RN 2250 05/27/15 A BROWNING    Culture STAPHYLOCOCCUS AUREUS  Final   Report Status 05/29/2015 FINAL  Final   Organism ID, Bacteria STAPHYLOCOCCUS AUREUS  Final      Susceptibility   Staphylococcus aureus - MIC*    CIPROFLOXACIN <=0.5 SENSITIVE Sensitive      ERYTHROMYCIN 0.5 SENSITIVE Sensitive     GENTAMICIN <=0.5 SENSITIVE Sensitive     OXACILLIN <=0.25 SENSITIVE Sensitive     TETRACYCLINE <=1 SENSITIVE Sensitive     VANCOMYCIN 1 SENSITIVE Sensitive     TRIMETH/SULFA <=10 SENSITIVE Sensitive     CLINDAMYCIN <=0.25 SENSITIVE Sensitive     RIFAMPIN <=0.5 SENSITIVE Sensitive     Inducible Clindamycin NEGATIVE Sensitive     * STAPHYLOCOCCUS AUREUS  Blood Culture (routine x 2)     Status: None   Collection Time: 05/27/15  9:39 AM  Result Value Ref Range Status   Specimen Description BLOOD RIGHT ANTECUBITAL  Final   Special Requests BOTTLES DRAWN AEROBIC AND ANAEROBIC 5ML  Final   Culture  Setup Time   Final    GRAM POSITIVE COCCI IN CLUSTERS IN BOTH AEROBIC AND ANAEROBIC BOTTLES CRITICAL RESULT CALLED TO, READ BACK BY AND VERIFIED WITH: R TARWATER RN 2250 05/27/15 A BROWNING    Culture   Final    STAPHYLOCOCCUS AUREUS SUSCEPTIBILITIES PERFORMED ON PREVIOUS CULTURE WITHIN THE LAST 5 DAYS.    Report Status 05/29/2015 FINAL  Final  Urine culture     Status: None   Collection Time: 05/27/15  9:52 AM  Result Value Ref Range Status   Specimen Description URINE, CLEAN CATCH  Final   Special Requests NONE  Final   Culture NO GROWTH 1 DAY  Final   Report Status 05/28/2015 FINAL  Final  Respiratory virus panel     Status: None   Collection Time: 05/27/15 12:43 PM  Result Value Ref Range Status   Source - RVPAN NASAL SWAB  Corrected   Respiratory Syncytial Virus A Negative Negative Final   Respiratory Syncytial Virus B Negative Negative Final   Influenza A Negative Negative Final   Influenza B Negative  Negative Final   Parainfluenza 1 Negative Negative Final   Parainfluenza 2 Negative Negative Final   Parainfluenza 3 Negative Negative Final   Metapneumovirus Negative Negative Final   Rhinovirus Negative Negative Final   Adenovirus Negative Negative Final    Comment: (NOTE) Performed At: [facility] [address] [place],  [place] [id number] Lindon Romp MD JM:[id number]   MRSA PCR Screening     Status: None   Collection Time: 05/27/15  4:25 PM  Result Value Ref Range Status   MRSA by PCR NEGATIVE NEGATIVE Final    Comment:        The GeneXpert MRSA Assay (FDA approved for NASAL specimens only), is one component of a comprehensive MRSA colonization surveillance program. It is not intended to diagnose MRSA infection nor to guide or monitor treatment for MRSA infections.   Culture, blood (routine x 2)     Status: None (Preliminary result)   Collection Time: 05/29/15 11:45 AM  Result Value Ref Range Status   Specimen Description BLOOD RIGHT ANTECUBITAL  Final   Special Requests BOTTLES DRAWN AEROBIC AND ANAEROBIC 10CC  Final   Culture NO GROWTH 3 DAYS  Final   Report Status PENDING  Incomplete  Culture, blood (routine x 2)     Status: None (Preliminary result)   Collection Time: 05/29/15 12:00 PM  Result Value Ref Range Status   Specimen Description BLOOD LEFT ANTECUBITAL  Final   Special Requests BOTTLES DRAWN AEROBIC AND ANAEROBIC 10CC  Final   Culture NO GROWTH 3 DAYS  Final   Report Status PENDING  Incomplete     Labs: Basic Metabolic Panel:  Recent Labs Lab 05/27/15 1650 05/28/15 0410 05/29/15 0653 05/29/15 1145 05/30/15 0420 05/31/15 0358 06/01/15 0325  NA  --  143 149*  --  149* 157* 159*  K  --  4.0 2.8* 4.0 3.4* 3.7 3.6  CL  --  105 103  --  107 116* 115*  CO2  --  24 29  --  30 32 34*  GLUCOSE  --  153* 200*  --  163* 180* 212*  BUN  --  23* 20  --  32* 35* 31*  CREATININE  --  1.30* 1.48*  --  1.20 1.28* 1.10  CALCIUM  --  8.4* 8.6*  --  8.6* 9.0 8.8*  MG 2.1  --  2.3 2.5* 2.6* 2.9*  --   PHOS 3.4  --   --   --   --   --   --    Liver Function Tests:  Recent Labs Lab 05/28/15 0410 05/29/15 0653 05/30/15 0420 05/31/15 0358 06/01/15 0325  AST 62* 70* 54* 36 32  ALT 49 59 45 35 29  ALKPHOS 55 52 45 42 39  BILITOT 0.7 1.0 1.2 0.9 1.0  PROT 6.1* 6.5 6.6 6.1* 6.1*   ALBUMIN 2.9* 2.6* 2.6* 2.6* 2.5*    Recent Labs Lab 05/29/15 0855  LIPASE 37  AMYLASE 53   No results for input(s): AMMONIA in the last 168 hours. CBC:  Recent Labs Lab 05/27/15 0920 05/28/15 0410 05/29/15 0047 05/30/15 0420 05/31/15 0358 06/01/15 0325  WBC 7.5 8.3 7.1 7.4 7.6 7.7  NEUTROABS 6.7  --  5.8 5.5 5.4  --   HGB 15.2 15.6 17.9* 17.6* 16.9 17.4*  HCT 45.8 47.6 53.3* 53.0* 53.2* 54.7*  MCV 92.0 93.0 92.1 93.3 95.2 95.6  PLT 126* 90* 80* 92* 113* 108*   Cardiac Enzymes:  Recent Labs Lab  05/28/15 1953 05/29/15 0653  TROPONINI 0.29* 1.13*   BNP: BNP (last 3 results) No results for input(s): BNP in the last 8760 hours.  ProBNP (last 3 results)  Recent Labs  08/25/14 1041  PROBNP 179.0*    CBG:  Recent Labs Lab 06/01/15 1518 06/01/15 1958 06/01/15 2311 06/02/15 0451 06/02/15 0748  GLUCAP 151* 158* 186* 185* 156*    Signed:  Dia Crawford, MD [facility] [phone] pager  Addendum:   I have simply updated the d/c date to reflect the actual date of d/c (06/02/2015).  The remainder of the note is exactly as composed by Dr. Sherral Hammers.  I have not charged the patient for this encounter.    Cherene Altes, MD [facility]

## 2015-06-02 NOTE — Progress Notes (Signed)
Patient will discharge to [facility] Anticipated discharge date:06/02/15 Family notified: pt spouse Transportation by [employer]- scheduled for 9:30am  CSW signing off.  Domenica Reamer, [place] Social Worker [phone]

## 2015-06-02 NOTE — Progress Notes (Signed)
Pt picked up by [facility] at this time and transported to [facility], wife has belongings and taking with her. Information packet taken by transporters.

## 2015-06-02 NOTE — Progress Notes (Signed)
Returned call to wife, made her aware of transportation to arrive around 0930 to take husband to [employer] [facility]. Wife states she is planning to come here, (SW had stated this am that wife was going to [employer] [facility] at 0900 to complete papers/forms, but wife states all was completed yesterday, was not given a time she needed to be there today so she is planning to arrive here, she is now aware of time transportation to pick husband up, nurse will call her mobile phone he transport arrives before she does.)

## 2015-06-02 NOTE — Progress Notes (Signed)
Report completed with [facility] place, report given to nurse Colletta [place], Corey Harold arrive around 0930 per SW.

## 2015-06-03 LAB — CULTURE, BLOOD (ROUTINE X 2)
CULTURE: NO GROWTH
Culture: NO GROWTH

## 2015-06-11 ENCOUNTER — Ambulatory Visit: Payer: Commercial Managed Care - HMO | Admitting: Gastroenterology

## 2015-06-23 ENCOUNTER — Ambulatory Visit: Payer: Commercial Managed Care - HMO | Admitting: Endocrinology

## 2015-07-08 DEATH — deceased

## 2015-07-20 ENCOUNTER — Telehealth: Payer: Self-pay | Admitting: Endocrinology

## 2015-07-22 ENCOUNTER — Ambulatory Visit: Payer: Commercial Managed Care - HMO | Admitting: Cardiovascular Disease

## 2015-07-23 NOTE — Telephone Encounter (Signed)
Error
